# Patient Record
Sex: Male | Born: 1945
Health system: Southern US, Community
[De-identification: ages and names within clinical notes are randomized; demographics above are authoritative.]

## PROBLEM LIST (undated history)

## (undated) DIAGNOSIS — E291 Testicular hypofunction: Secondary | ICD-10-CM

## (undated) DIAGNOSIS — J189 Pneumonia, unspecified organism: Secondary | ICD-10-CM

## (undated) DIAGNOSIS — R51 Headache: Secondary | ICD-10-CM

## (undated) DIAGNOSIS — N200 Calculus of kidney: Secondary | ICD-10-CM

## (undated) DIAGNOSIS — G709 Myoneural disorder, unspecified: Secondary | ICD-10-CM

## (undated) DIAGNOSIS — I10 Essential (primary) hypertension: Secondary | ICD-10-CM

## (undated) DIAGNOSIS — I639 Cerebral infarction, unspecified: Secondary | ICD-10-CM

## (undated) DIAGNOSIS — F419 Anxiety disorder, unspecified: Secondary | ICD-10-CM

## (undated) DIAGNOSIS — F32A Depression, unspecified: Secondary | ICD-10-CM

## (undated) DIAGNOSIS — Z87442 Personal history of urinary calculi: Secondary | ICD-10-CM

## (undated) DIAGNOSIS — N400 Enlarged prostate without lower urinary tract symptoms: Secondary | ICD-10-CM

## (undated) DIAGNOSIS — N529 Male erectile dysfunction, unspecified: Secondary | ICD-10-CM

## (undated) HISTORY — DX: Essential (primary) hypertension: I10

## (undated) HISTORY — PX: FOOT SURGERY: SHX648

## (undated) HISTORY — DX: Benign prostatic hyperplasia without lower urinary tract symptoms: N40.0

## (undated) HISTORY — DX: Headache: R51

## (undated) HISTORY — PX: TONSILLECTOMY: SUR1361

## (undated) HISTORY — DX: Calculus of kidney: N20.0

## (undated) HISTORY — DX: Male erectile dysfunction, unspecified: N52.9

## (undated) HISTORY — DX: Testicular hypofunction: E29.1

---

## 1994-05-17 ENCOUNTER — Encounter: Payer: Self-pay | Admitting: Gastroenterology

## 2004-12-14 ENCOUNTER — Ambulatory Visit: Payer: Self-pay | Admitting: Family Medicine

## 2005-01-18 ENCOUNTER — Ambulatory Visit: Payer: Self-pay | Admitting: Gastroenterology

## 2005-07-18 ENCOUNTER — Ambulatory Visit: Payer: Self-pay | Admitting: Family Medicine

## 2005-08-07 ENCOUNTER — Ambulatory Visit: Payer: Self-pay | Admitting: Family Medicine

## 2006-09-25 ENCOUNTER — Ambulatory Visit: Payer: Self-pay | Admitting: Family Medicine

## 2006-10-04 ENCOUNTER — Ambulatory Visit: Payer: Self-pay | Admitting: Family Medicine

## 2006-10-04 LAB — CONVERTED CEMR LAB
ALT: 71 units/L — ABNORMAL HIGH (ref 0–40)
AST: 30 units/L (ref 0–37)
Basophils Relative: 0.3 % (ref 0.0–1.0)
Bilirubin, Direct: 0.1 mg/dL (ref 0.0–0.3)
Calcium: 9.3 mg/dL (ref 8.4–10.5)
Chloride: 106 meq/L (ref 96–112)
Creatinine, Ser: 1.1 mg/dL (ref 0.4–1.5)
Eosinophils Absolute: 0.1 10*3/uL (ref 0.0–0.6)
Eosinophils Relative: 1.3 % (ref 0.0–5.0)
GFR calc non Af Amer: 73 mL/min
Glucose, Bld: 94 mg/dL (ref 70–99)
Platelets: 355 10*3/uL (ref 150–400)
RBC: 4.47 M/uL (ref 4.22–5.81)
RDW: 12 % (ref 11.5–14.6)
Total Bilirubin: 1.1 mg/dL (ref 0.3–1.2)
WBC: 8.1 10*3/uL (ref 4.5–10.5)

## 2007-07-04 ENCOUNTER — Telehealth (INDEPENDENT_AMBULATORY_CARE_PROVIDER_SITE_OTHER): Payer: Self-pay | Admitting: *Deleted

## 2007-07-07 DIAGNOSIS — I10 Essential (primary) hypertension: Secondary | ICD-10-CM | POA: Insufficient documentation

## 2007-07-22 ENCOUNTER — Encounter: Payer: Self-pay | Admitting: Family Medicine

## 2007-07-24 ENCOUNTER — Ambulatory Visit: Payer: Self-pay | Admitting: Family Medicine

## 2007-07-24 LAB — CONVERTED CEMR LAB
Bilirubin Urine: NEGATIVE
Glucose, Urine, Semiquant: NEGATIVE
WBC Urine, dipstick: NEGATIVE
pH: 5.5

## 2007-07-25 LAB — CONVERTED CEMR LAB
AST: 26 units/L (ref 0–37)
Albumin: 4.3 g/dL (ref 3.5–5.2)
Alkaline Phosphatase: 60 units/L (ref 39–117)
BUN: 19 mg/dL (ref 6–23)
Basophils Absolute: 0 10*3/uL (ref 0.0–0.1)
Chloride: 108 meq/L (ref 96–112)
Cholesterol: 212 mg/dL (ref 0–200)
Creatinine, Ser: 1 mg/dL (ref 0.4–1.5)
Direct LDL: 130.6 mg/dL
MCHC: 33.9 g/dL (ref 30.0–36.0)
Monocytes Relative: 9.5 % (ref 3.0–11.0)
RBC: 4.38 M/uL (ref 4.22–5.81)
RDW: 11.8 % (ref 11.5–14.6)
Total Bilirubin: 0.9 mg/dL (ref 0.3–1.2)
Total CHOL/HDL Ratio: 4.1
Triglycerides: 59 mg/dL (ref 0–149)

## 2007-09-15 ENCOUNTER — Ambulatory Visit: Payer: Self-pay | Admitting: Family Medicine

## 2007-09-15 DIAGNOSIS — R51 Headache: Secondary | ICD-10-CM

## 2007-09-15 DIAGNOSIS — R519 Headache, unspecified: Secondary | ICD-10-CM | POA: Insufficient documentation

## 2008-01-01 ENCOUNTER — Emergency Department (HOSPITAL_COMMUNITY): Admission: EM | Admit: 2008-01-01 | Discharge: 2008-01-02 | Payer: Self-pay | Admitting: Emergency Medicine

## 2008-01-22 ENCOUNTER — Ambulatory Visit (HOSPITAL_COMMUNITY): Admission: RE | Admit: 2008-01-22 | Discharge: 2008-01-22 | Payer: Self-pay | Admitting: Urology

## 2008-10-21 ENCOUNTER — Telehealth: Payer: Self-pay | Admitting: Family Medicine

## 2008-10-25 ENCOUNTER — Ambulatory Visit: Payer: Self-pay | Admitting: Family Medicine

## 2008-10-25 DIAGNOSIS — Z87442 Personal history of urinary calculi: Secondary | ICD-10-CM

## 2008-10-25 DIAGNOSIS — N400 Enlarged prostate without lower urinary tract symptoms: Secondary | ICD-10-CM | POA: Insufficient documentation

## 2008-10-25 HISTORY — DX: Personal history of urinary calculi: Z87.442

## 2008-12-08 ENCOUNTER — Ambulatory Visit: Payer: Self-pay | Admitting: Family Medicine

## 2008-12-08 LAB — CONVERTED CEMR LAB
Bilirubin Urine: NEGATIVE
Ketones, urine, test strip: NEGATIVE
Urobilinogen, UA: 0.2
pH: 6.5

## 2008-12-10 LAB — CONVERTED CEMR LAB
Alkaline Phosphatase: 55 units/L (ref 39–117)
BUN: 23 mg/dL (ref 6–23)
Basophils Absolute: 0 10*3/uL (ref 0.0–0.1)
Bilirubin, Direct: 0 mg/dL (ref 0.0–0.3)
CO2: 27 meq/L (ref 19–32)
Calcium: 9.1 mg/dL (ref 8.4–10.5)
Cholesterol: 207 mg/dL — ABNORMAL HIGH (ref 0–200)
Creatinine, Ser: 1 mg/dL (ref 0.4–1.5)
Direct LDL: 139.4 mg/dL
Eosinophils Absolute: 0.2 10*3/uL (ref 0.0–0.7)
Glucose, Bld: 108 mg/dL — ABNORMAL HIGH (ref 70–99)
Lymphocytes Relative: 35.2 % (ref 12.0–46.0)
MCHC: 34.1 g/dL (ref 30.0–36.0)
Monocytes Absolute: 0.5 10*3/uL (ref 0.1–1.0)
Neutrophils Relative %: 50.8 % (ref 43.0–77.0)
PSA: 2.52 ng/mL (ref 0.10–4.00)
Platelets: 210 10*3/uL (ref 150.0–400.0)
RBC: 4.15 M/uL — ABNORMAL LOW (ref 4.22–5.81)
RDW: 11.7 % (ref 11.5–14.6)
Total Bilirubin: 1.2 mg/dL (ref 0.3–1.2)
Total CHOL/HDL Ratio: 5
Triglycerides: 103 mg/dL (ref 0.0–149.0)

## 2008-12-15 ENCOUNTER — Ambulatory Visit: Payer: Self-pay | Admitting: Family Medicine

## 2009-03-28 ENCOUNTER — Encounter: Payer: Self-pay | Admitting: Family Medicine

## 2009-06-24 ENCOUNTER — Telehealth: Payer: Self-pay | Admitting: Family Medicine

## 2009-12-14 ENCOUNTER — Ambulatory Visit: Payer: Self-pay | Admitting: Family Medicine

## 2009-12-14 LAB — CONVERTED CEMR LAB
Bilirubin Urine: NEGATIVE
Ketones, urine, test strip: NEGATIVE
Specific Gravity, Urine: 1.025

## 2009-12-15 LAB — CONVERTED CEMR LAB
ALT: 24 units/L (ref 0–53)
Albumin: 4.4 g/dL (ref 3.5–5.2)
BUN: 24 mg/dL — ABNORMAL HIGH (ref 6–23)
Basophils Relative: 0.5 % (ref 0.0–3.0)
CO2: 30 meq/L (ref 19–32)
Chloride: 105 meq/L (ref 96–112)
Cholesterol: 180 mg/dL (ref 0–200)
Creatinine, Ser: 1 mg/dL (ref 0.4–1.5)
Eosinophils Absolute: 0.2 10*3/uL (ref 0.0–0.7)
Eosinophils Relative: 2.9 % (ref 0.0–5.0)
HCT: 40.6 % (ref 39.0–52.0)
LDL Cholesterol: 123 mg/dL — ABNORMAL HIGH (ref 0–99)
Lymphs Abs: 1.4 10*3/uL (ref 0.7–4.0)
MCHC: 34.6 g/dL (ref 30.0–36.0)
MCV: 99.9 fL (ref 78.0–100.0)
Monocytes Absolute: 0.6 10*3/uL (ref 0.1–1.0)
Neutro Abs: 3 10*3/uL (ref 1.4–7.7)
Neutrophils Relative %: 58.1 % (ref 43.0–77.0)
PSA: 2.37 ng/mL (ref 0.10–4.00)
Potassium: 5.1 meq/L (ref 3.5–5.1)
RBC: 4.06 M/uL — ABNORMAL LOW (ref 4.22–5.81)
TSH: 2.49 microintl units/mL (ref 0.35–5.50)
Total CHOL/HDL Ratio: 4
Total Protein: 6.9 g/dL (ref 6.0–8.3)
Triglycerides: 71 mg/dL (ref 0.0–149.0)
WBC: 5.2 10*3/uL (ref 4.5–10.5)

## 2009-12-27 ENCOUNTER — Ambulatory Visit: Payer: Self-pay | Admitting: Family Medicine

## 2010-01-04 ENCOUNTER — Encounter (INDEPENDENT_AMBULATORY_CARE_PROVIDER_SITE_OTHER): Payer: Self-pay | Admitting: *Deleted

## 2010-02-22 ENCOUNTER — Ambulatory Visit: Payer: Self-pay | Admitting: Gastroenterology

## 2010-03-28 ENCOUNTER — Ambulatory Visit: Payer: Self-pay | Admitting: Gastroenterology

## 2010-03-28 HISTORY — PX: OTHER SURGICAL HISTORY: SHX169

## 2010-03-29 DIAGNOSIS — K648 Other hemorrhoids: Secondary | ICD-10-CM | POA: Insufficient documentation

## 2010-04-02 ENCOUNTER — Encounter: Payer: Self-pay | Admitting: Gastroenterology

## 2010-04-14 ENCOUNTER — Encounter: Payer: Self-pay | Admitting: Gastroenterology

## 2010-04-14 HISTORY — PX: STAPLE HEMORRHOIDECTOMY: SHX2438

## 2010-04-25 ENCOUNTER — Telehealth: Payer: Self-pay | Admitting: Family Medicine

## 2010-06-02 ENCOUNTER — Ambulatory Visit (HOSPITAL_COMMUNITY)
Admission: RE | Admit: 2010-06-02 | Discharge: 2010-06-02 | Payer: Self-pay | Source: Home / Self Care | Attending: Surgery | Admitting: Surgery

## 2010-06-22 ENCOUNTER — Encounter: Payer: Self-pay | Admitting: Family Medicine

## 2010-06-29 ENCOUNTER — Telehealth: Payer: Self-pay | Admitting: Family Medicine

## 2010-07-16 LAB — CONVERTED CEMR LAB: Testosterone: 349.06 ng/dL — ABNORMAL LOW (ref 350.00–890.00)

## 2010-07-19 NOTE — Progress Notes (Signed)
Summary: med questions  Phone Note Call from Patient Call back at [facility] Phone [phone]   Summary of Call: Wants to go on a daily dosage of Cialis, and needs to pick up the prescription.  Initial call taken by: Lynann Beaver CMA AAMA,  April 25, 2010 4:05 PM  Follow-up for Phone Call        call in 5 mg once daily , #30 with 11 rf Follow-up by: [name] MD,  April 25, 2010 4:37 PM  Additional Follow-up for Phone Call Additional follow up Details #1::        wants 90 day supply for [facility] and pt will pick up  Additional Follow-up by: Pura Spice, RN,  April 26, 2010 8:18 AM    Additional Follow-up for Phone Call Additional follow up Details #2::    done  Follow-up by: [name] MD,  April 26, 2010 8:50 AM  New/Updated Medications: CIALIS 5 MG TABS (TADALAFIL) once daily Prescriptions: CIALIS 5 MG TABS (TADALAFIL) once daily  #90 x 3   Entered and Authorized by:   [name] MD   Signed by:   [name] MD on 04/26/2010   Method used:   Print then Give to Patient   RxID:   [id number]

## 2010-07-19 NOTE — Progress Notes (Signed)
Summary: refill androgel pump  Phone Note Refill Request   Refills Requested: Medication #1:  ANDROGEL PUMP 1 % GEL apply 10 grams daily Initial call taken by: Alfred Levins, CMA,  June 24, 2009 3:57 PM  Follow-up for Phone Call        call these in for one year Follow-up by: [name] MD,  June 24, 2009 5:10 PM  Additional Follow-up for Phone Call Additional follow up Details #1::        Rx called to pharmacy Additional Follow-up by: Alfred Levins, CMA,  June 24, 2009 5:17 PM    Prescriptions: ANDROGEL PUMP 1 % GEL (TESTOSTERONE) apply 10 grams daily  #1 x 11   Entered by:   Alfred Levins, CMA   Authorized by:   [name] MD   Signed by:   Alfred Levins, CMA on 06/24/2009   Method used:   Telephoned to ...       [facility] [address] [phone]* (retail)       [address]       [place], [place]  [address]       Ph: [phone] or [phone]       Fax: [phone]   RxID:   [id number]

## 2010-07-19 NOTE — Procedures (Signed)
Summary: Colonoscopy  Patient: Calvin [name] Note: All result statuses are Final unless otherwise noted.  Tests: (1) Colonoscopy (COL)   COL Colonoscopy           DONE     [facility]     [address]     [place], [place]  [address]           COLONOSCOPY PROCEDURE REPORT     PATIENT:  Calvin, [name]  MR#:  [id number]     BIRTHDATE:  [birth year], 64 yrs. old  GENDER:  male     ENDOSCOPIST:  Judie Petit T. Russella Dar, MD, [facility]     Referred by:  Tera Mater. Clent Ridges, M.D.     PROCEDURE DATE:  03/28/2010     PROCEDURE:  Colonoscopy with snare polypectomy     ASA CLASS:  Class II     INDICATIONS:  1) Routine Risk Screening     MEDICATIONS:   Fentanyl 50 mcg IV, Versed 7 mg IV     DESCRIPTION OF PROCEDURE:   After the risks benefits and     alternatives of the procedure were thoroughly explained, informed     consent was obtained.  Digital rectal exam was performed and     revealed prolasped hemorrhoids, possible condyloma. The LB     [id number] endoscope was introduced through the anus and     advanced to the cecum, which was identified by both the appendix     and ileocecal valve, without limitations.  The quality of the prep     was excellent, using MoviPrep.  The instrument was then slowly     withdrawn as the colon was fully examined.     <<PROCEDUREIMAGES>>     FINDINGS:  A sessile polyp was found in the mid transverse colon.     It was 5 mm in size. Polyp was snared without cautery. Retrieval     was successful. snare polyp  Mild diverticulosis was found in the     sigmoid colon.  A normal appearing cecum, ileocecal valve, and     appendiceal orifice were identified. The ascending, hepatic     flexure, splenic flexure, descending colon, and rectum appeared     unremarkable. Retroflexed views in the rectum revealed internal     hemorrhoids, moderate size, prolapsing. R/O condyloma. The time to     cecum =  5.5  minutes. The scope was then withdrawn (time =  12.25     min) from  the patient and the procedure completed.           COMPLICATIONS:  None           ENDOSCOPIC IMPRESSION:     1) 5 mm sessile polyp in the mid transverse colon     2) Mild diverticulosis in the sigmoid colon     3) Internal hemorrhoids, prolasping, R/O condyloma           RECOMMENDATIONS:     1) Await pathology results     2) High fiber diet with liberal fluid intake.     3) If the polyp removed is adenomatous (pre-cancerous), you will     need a repeat colonoscopy in 5 years. Otherwise follow colorectal     cancer screening for "routine risk" patients with colonoscopy in     10 years.     4) My office will arrange for you to meet with a surgeon.  Venita Lick. Russella Dar, MD, [facility]           n.     eSIGNED:   Venita Lick. Stark at 03/28/2010 04:40 PM           Verdis Prime, [id number]  Note: An exclamation mark (!) indicates a result that was not dispersed into the flowsheet. Document Creation Date: 03/28/2010 4:41 PM _______________________________________________________________________  (1) Order result status: Final Collection or observation date-time: 03/28/2010 16:35 Requested date-time:  Receipt date-time:  Reported date-time:  Referring Physician:   Ordering Physician: Claudette Head [phone]) Specimen Source:  Source: Launa Grill Order Number: [id number] Lab site:   Appended Document: Orders Update    Clinical Lists Changes  Problems: Added new problem of INTERNAL HEMORRHOIDS WITH OTHER COMPLICATION (ICD-455.2) Orders: Added new Test order of [facility] ([facility]) - Signed      Appended Document: Colonoscopy Pt notified of appt at [facility] with Dr. Michaell Cowing on 04/14/10 at 9:00am. Pt verbalized understanding.  Appended Document: Colonoscopy     Procedures Next Due Date:    Colonoscopy: 03/2020

## 2010-07-19 NOTE — Assessment & Plan Note (Signed)
Summary: cpx/cjr/pt rsc from bmp/cjr   Vital Signs:  Patient profile:   65 year old male Weight:      208 pounds BMI:     28.91 BP sitting:   138 / 100  (left arm) Cuff size:   regular  Vitals Entered By: Raechel Ache, RN (December 27, 2009 10:18 AM) CC: CPX, labs done. C/o L elbow pain and rash on face.   History of Present Illness: 65 yr old male for a cpx. He feels well in general although he has had pain in the left elbow for about 2 months. 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 He has been on Androgel for a year for a low testosterone level, but he is not sure it is doing him any good. Also he still struggles with some BPH issues and some nocturia.   Allergies: 1)  ! Sulfa  Past History:  Past Medical History: Headache Hypertension ED Nephrolithiasis, hx of. Had lithotripsy in 1-10 Benign prostatic hypertrophy hypogonadism  Past Surgical History: Reviewed history from 09/15/2007 and no changes required. Tonsillectomy colonoscopy 8-06 per Dr. Corinda Gubler, repeat in 5 yrs  Family History: Reviewed history from 09/15/2007 and no changes required. Family History Hypertension Family History of Stroke M 1st degree relative <50 Rheumatic heart disease Family History of CAD Male 1st degree relative <50 Family History of CAD Male 1st degree relative <60  Social History: Reviewed history from 09/15/2007 and no changes required. Never Smoked Alcohol use-yes Widow/Widower  Review of Systems  The patient denies anorexia, fever, weight loss, weight gain, vision loss, decreased hearing, hoarseness, chest pain, syncope, dyspnea on exertion, peripheral edema, prolonged cough, headaches, hemoptysis, abdominal pain, melena, hematochezia, severe indigestion/heartburn, hematuria, incontinence, genital sores, muscle weakness, suspicious skin lesions, transient blindness, difficulty walking, depression,  unusual weight change, abnormal bleeding, enlarged lymph nodes, angioedema, breast masses, and testicular masses.    Physical Exam  General:  Well-developed,well-nourished,in no acute distress; alert,appropriate and cooperative throughout examination Head:  Normocephalic and atraumatic without obvious abnormalities. No apparent alopecia or balding. Eyes:  No corneal or conjunctival inflammation noted. EOMI. Perrla. Funduscopic exam benign, without hemorrhages, exudates or papilledema. Vision grossly normal. Ears:  External ear exam shows no significant lesions or deformities.  Otoscopic examination reveals clear canals, tympanic membranes are intact bilaterally without bulging, retraction, inflammation or discharge. Hearing is grossly normal bilaterally. Nose:  External nasal examination shows no deformity or inflammation. Nasal mucosa are pink and moist without lesions or exudates. Mouth:  Oral mucosa and oropharynx without lesions or exudates.  Teeth in good repair. Neck:  No deformities, masses, or tenderness noted. Chest Wall:  No deformities, masses, tenderness or gynecomastia noted. Lungs:  Normal respiratory effort, chest expands symmetrically. Lungs are clear to auscultation, no crackles or wheezes. Heart:  Normal rate and regular rhythm. S1 and S2 normal without gallop, murmur, click, rub or other extra sounds. EKG normal Abdomen:  Bowel sounds positive,abdomen soft and non-tender without masses, organomegaly or hernias noted. Rectal:  No external abnormalities noted. Normal sphincter tone. No rectal masses or tenderness. Heme neg. Genitalia:  Testes bilaterally descended without nodularity, tenderness or masses. No scrotal masses or lesions. No penis lesions or urethral discharge. Prostate:  no nodules, no asymmetry, and 2+ enlarged.   Msk:  No deformity or scoliosis noted of thoracic or lumbar spine.   Pulses:  R and L carotid,radial,femoral,dorsalis pedis and posterior tibial pulses  are  full and equal bilaterally Extremities:  No clubbing, cyanosis, edema, or deformity noted with normal full range of motion of all joints.   Neurologic:  No cranial nerve deficits noted. Station and gait are normal. Plantar reflexes are down-going bilaterally. DTRs are symmetrical throughout. Sensory, motor and coordinative functions appear intact. Skin:  Intact without suspicious lesions or rashes Cervical Nodes:  No lymphadenopathy noted Axillary Nodes:  No palpable lymphadenopathy Inguinal Nodes:  No significant adenopathy Psych:  Cognition and judgment appear intact. Alert and cooperative with normal attention span and concentration. No apparent delusions, illusions, hallucinations   Impression & Recommendations:  Problem # 1:  WELL ADULT EXAM (ICD-V70.0)  Orders: Hemoccult Guaiac-1 spec.(in office) (82270) EKG w/ Interpretation (93000)  Complete Medication List: 1)  Diovan Hct 320-25 Mg Tabs (Valsartan-hydrochlorothiazide) .... Once daily 2)  Flomax 0.4 Mg Cp24 (Tamsulosin hcl) .... Once daily 3)  Bayer Low Strength 81 Mg Tbec (Aspirin) .... Once daily 4)  Avodart 0.5 Mg Caps (Dutasteride) .... Once daily 5)  Viagra 100 Mg Tabs (Sildenafil citrate) .... As needed 6)  Amlodipine Besylate 5 Mg Tabs (Amlodipine besylate) .... Once daily 7)  Etodolac 500 Mg Tabs (Etodolac) .... Two times a day as needed pain  Patient Instructions: 1)  Stop Androgel, and continue with Flomax and Avodart. He has some tendonitis in the elbow, so I advised him to rest this as much as possible for a few weeks. Use ice packs and Etodolac. Add Amlodipine for the HTN.  2)  Schedule a colonoscopy/ sigmoidoscopy to help detect colon cancer.  3)  Please schedule a follow-up appointment in 1 month.  Prescriptions: ETODOLAC 500 MG TABS (ETODOLAC) two times a day as needed pain  #60 x 2   Entered and Authorized by:   [name] MD   Signed by:   [name] MD on 12/27/2009   Method used:    Electronically to        [facility] [address] [phone]* (retail)       [address]       [place], [place]  [address]       Ph: [phone] or [phone]       Fax: [phone]   RxID:   [id number] AVODART 0.5 MG CAPS (DUTASTERIDE) once daily  #30 x 11   Entered and Authorized by:   [name] MD   Signed by:   [name] MD on 12/27/2009   Method used:   Electronically to        [facility]. [address] [phone]* (retail)       [address]       [place], [place]  [address]       Ph: [phone] or [phone]       Fax: [phone]   RxID:   [id number] FLOMAX 0.4 MG  CP24 (TAMSULOSIN HCL) once daily  #30 x 11   Entered and Authorized by:   [name] MD   Signed by:   [name] MD on 12/27/2009   Method used:   Electronically to        [facility]. [address] [phone]* (retail)       [address]       [place], [place]  [address]       Ph: [phone] or  [phone]       Fax: [phone]   RxID:   [id number] DIOVAN HCT 320-25 MG TABS (VALSARTAN-HYDROCHLOROTHIAZIDE) once daily  #30 x 11   Entered and Authorized by:   [name] MD   Signed by:   [name] MD on 12/27/2009   Method used:   Electronically to        [facility] [address] [phone]* (retail)       [address]       [place], [place]  [address]       Ph: [phone] or [phone]       Fax: [phone]   RxID:   [id number] AMLODIPINE BESYLATE 5 MG TABS (AMLODIPINE BESYLATE) once daily  #30 x 11   Entered and Authorized by:   [name] MD   Signed by:   [name] MD on 12/27/2009   Method used:   Electronically to        [facility] [address] [phone]* (retail)       [address]       [place], [place]  [address]       Ph: [phone] or [phone]       Fax: [phone]   RxID:   [id number]

## 2010-07-19 NOTE — Letter (Signed)
Summary: Patient Notice-Hyperplastic Polyps  [facility]  [address] [place], [place] [address]   Phone: [phone]  Fax: [phone]        April 02, 2010 [name]: [id number]    Calvin [name] [address] [place], [place]  [address]    Dear Mr. Barse,  I am pleased to inform you that the colon polyp(s) removed during your recent colonoscopy was (were) found to be inflammatory. These types of polyps are NOT pre-cancerous.  It is my recommendation that you have a repeat colonoscopy examination in 10 years for routine colorectal cancer screening.  Should you develop new or worsening symptoms of abdominal pain, bowel habit changes or bleeding from the rectum or bowels, please schedule an evaluation with either your primary care physician or with me.  Continue treatment plan as outlined the day of your exam.  Please call us if you are having persistent problems or have questions about your condition that have not been fully answered at this time.  Sincerely,  Meryl Dare MD [facility]  This letter has been electronically signed by your physician.  Appended Document: Patient Notice-Hyperplastic Polyps letter mailed

## 2010-07-19 NOTE — Assessment & Plan Note (Signed)
Summary: Discuss Colon/Flex--ch.   History of Present Illness Visit Type: consult Primary GI MD: Elie Goody MD [facility] Primary Provider: Gershon Crane, MD Requesting Provider: Gershon Crane, MD Chief Complaint: pt here to discuss screening colon History of Present Illness:   This is a 65 year old male, who is here to discuss colorectal cancer screening. He has no ongoing gastrointestinal complaints. He previously underwent colonoscopy in 1995 that was unremarkable. He has stable hypertension, BPH, and hypogonadism.   GI Review of Systems      Denies abdominal pain, acid reflux, belching, bloating, chest pain, dysphagia with liquids, dysphagia with solids, heartburn, loss of appetite, nausea, vomiting, vomiting blood, weight loss, and  weight gain.        Denies anal fissure, black tarry stools, change in bowel habit, constipation, diarrhea, diverticulosis, fecal incontinence, heme positive stool, hemorrhoids, irritable bowel syndrome, jaundice, light color stool, liver problems, rectal bleeding, and  rectal pain.   Current Medications (verified): 1)  Diovan Hct 320-25 Mg Tabs (Valsartan-Hydrochlorothiazide) .... Once Daily 2)  Flomax 0.4 Mg  Cp24 (Tamsulosin Hcl) .... Once Daily 3)  Bayer Low Strength 81 Mg Tbec (Aspirin) .... Once Daily 4)  Avodart 0.5 Mg Caps (Dutasteride) .... Once Daily 5)  Viagra 100 Mg Tabs (Sildenafil Citrate) .... As Needed 6)  Amlodipine Besylate 5 Mg Tabs (Amlodipine Besylate) .... Once Daily 7)  Etodolac 500 Mg Tabs (Etodolac) .... Two Times A Day As Needed Pain  Allergies (verified): 1)  ! Sulfa  Past History:  Past Medical History: Reviewed history from 12/27/2009 and no changes required. Headache Hypertension ED Nephrolithiasis, hx of. Had lithotripsy in 1-10 Benign prostatic hypertrophy hypogonadism  Past Surgical History: Reviewed history from 02/21/2010 and no changes required. Tonsillectomy  Family History: Reviewed history from  12/27/2009 and no changes required. Family History Hypertension Family History of Stroke M 1st degree relative <50 Rheumatic heart disease Family History of CAD Male 1st degree relative <50 Family History of CAD Male 1st degree relative <60  Social History: Reviewed history from 12/27/2009 and no changes required. Never Smoked Alcohol use-yes Widow/Widower Daily Caffeine Use Illicit Drug Use - no Drug Use:  no  Review of Systems       The patient complains of skin rash and urination - excessive.         The pertinent positives and negatives are noted as above and in the HPI. All other ROS were reviewed and were negative.   Vital Signs:  Patient profile:   65 year old male Height:      71.25214 inches Weight:      6 pounds BMI:     0.83 Pulse rate:   80 / minute Pulse rhythm:   regular BP sitting:   118 / 78  (left arm)  Vitals Entered By: Milford Cage NCMA (February 22, 2010 2:45 PM)  Physical Exam  General:  Well developed, well nourished, no acute distress. Head:  Normocephalic and atraumatic. Eyes:  PERRLA, no icterus. Ears:  Normal auditory acuity. Mouth:  No deformity or lesions, dentition normal. Neck:  Supple; no masses or thyromegaly. Lungs:  Clear throughout to auscultation. Heart:  Regular rate and rhythm; no murmurs, rubs,  or bruits. Abdomen:  Soft, nontender and nondistended. No masses, hepatosplenomegaly or hernias noted. Normal bowel sounds. Rectal:  deferred until time of colonoscopy.   Msk:  Symmetrical with no gross deformities. Normal posture. Pulses:  Normal pulses noted. Extremities:  No clubbing, cyanosis, edema or deformities noted. Neurologic:  Alert  and  oriented x4;  grossly normal neurologically. Cervical Nodes:  No significant cervical adenopathy. Inguinal Nodes:  No significant inguinal adenopathy. Psych:  Alert and cooperative. Normal mood and affect.   Impression & Recommendations:  Problem # 1:  SPECIAL SCREENING FOR  MALIGNANT NEOPLASMS COLON (ICD-V76.51) Average risk for colorectal cancer. It has been almost 16 years since his last colonoscopy. The risks, benefits and alternatives to colonoscopy with possible biopsy and possible polypectomy were discussed with the patient and they consent to proceed. The procedure will be scheduled electively. Orders: Colonoscopy (Colon)  Patient Instructions: 1)  Colonoscopy brochure given.  2)  Pick up your prep from your pharmacy.  3)  Copy sent to : Gershon Crane, MD 4)  The medication list was reviewed and reconciled.  All changed / newly prescribed medications were explained.  A complete medication list was provided to the patient / caregiver.  Prescriptions: MOVIPREP 100 GM  SOLR (PEG-KCL-NACL-NASULF-NA ASC-C) As per prep instructions.  #1 x 0   Entered by:   Christie Nottingham CMA (AAMA)   Authorized by:   Meryl Dare MD [facility]   Signed by:   Meryl Dare MD [facility] on 02/22/2010   Method used:   Electronically to        [facility]. [address] [phone]* (retail)       [address]       [place], [place]  [address]       Ph: [phone] or [phone]       Fax: [phone]   RxID:   [id number]

## 2010-07-19 NOTE — Procedures (Signed)
Summary: [employer]   Imported By: Sherian Rein 02/23/2010 11:52:23  _____________________________________________________________________  External Attachment:    Type:   Image     Comment:   External Document

## 2010-07-19 NOTE — Letter (Signed)
Summary: [facility] Instructions  [facility]  [address] [place], [place] [address]   Phone: [phone]  Fax: [phone]       Calvin [name]    06-02-1946    [name]: [id number]        Procedure Day /Date: Tuesday October 11th, 2011     Arrival Time: 3:00pm     Procedure Time: 4:00pm     Location of Procedure:                    _ x_  [facility] (4th Floor)                        PREPARATION FOR COLONOSCOPY WITH MOVIPREP   Starting 5 days prior to your procedure 03/23/10 do not eat nuts, seeds, popcorn, corn, beans, peas,  salads, or any raw vegetables.  Do not take any fiber supplements (e.g. Metamucil, Citrucel, and Benefiber).  THE DAY BEFORE YOUR PROCEDURE         DATE:03/27/10  ONG:EXBMWU  1.  Drink clear liquids the entire day-NO SOLID FOOD  2.  Do not drink anything colored red or purple.  Avoid juices with pulp.  No orange juice.  3.  Drink at least 64 oz. (8 glasses) of fluid/clear liquids during the day to prevent dehydration and help the prep work efficiently.  CLEAR LIQUIDS INCLUDE: Water Jello Ice Popsicles Tea (sugar ok, no milk/cream) Powdered fruit flavored drinks Coffee (sugar ok, no milk/cream) Gatorade Juice: apple, white grape, white cranberry  Lemonade Clear bullion, consomm, broth Carbonated beverages (any kind) Strained chicken noodle soup Hard Candy                             4.  In the morning, mix first dose of MoviPrep solution:    Empty 1 Pouch A and 1 Pouch B into the disposable container    Add lukewarm drinking water to the top line of the container. Mix to dissolve    Refrigerate (mixed solution should be used within 24 hrs)  5.  Begin drinking the prep at 5:00 p.m. The MoviPrep container is divided by 4 marks.   Every 15 minutes drink the solution down to the next mark (approximately 8 oz) until the full liter is complete.   6.  Follow completed prep with 16 oz of clear liquid of your choice  (Nothing red or purple).  Continue to drink clear liquids until bedtime.  7.  Before going to bed, mix second dose of MoviPrep solution:    Empty 1 Pouch A and 1 Pouch B into the disposable container    Add lukewarm drinking water to the top line of the container. Mix to dissolve    Refrigerate  THE DAY OF YOUR PROCEDURE      DATE: 03/28/10 XLK:GMWNUUV  Beginning at 11:00 a.m. (5 hours before procedure):         1. Every 15 minutes, drink the solution down to the next mark (approx 8 oz) until the full liter is complete.  2. Follow completed prep with 16 oz. of clear liquid of your choice.    3. You may drink clear liquids until 2:00pm (2 HOURS BEFORE PROCEDURE).   MEDICATION INSTRUCTIONS  Unless otherwise instructed, you should take regular prescription medications with a small sip of water   as early as possible the morning of your procedure.  OTHER INSTRUCTIONS  You will need a responsible adult at least 63 years of age to accompany you and drive you home.   This person must remain in the waiting room during your procedure.  Wear loose fitting clothing that is easily removed.  Leave jewelry and other valuables at home.  However, you may wish to bring a book to read or  an iPod/MP3 player to listen to music as you wait for your procedure to start.  Remove all body piercing jewelry and leave at home.  Total time from sign-in until discharge is approximately 2-3 hours.  You should go home directly after your procedure and rest.  You can resume normal activities the  day after your procedure.  The day of your procedure you should not:   Drive   Make legal decisions   Operate machinery   Drink alcohol   Return to work  You will receive specific instructions about eating, activities and medications before you leave.    The above instructions have been reviewed and explained to me by   [name].     I fully understand and can verbalize these  instructions _____________________________ Date _________

## 2010-07-19 NOTE — Letter (Signed)
Summary: [facility] Surgery   Imported By: Sherian Rein 04/21/2010 11:59:13  _____________________________________________________________________  External Attachment:    Type:   Image     Comment:   External Document

## 2010-07-19 NOTE — Letter (Signed)
Summary: New Patient letter  [facility]  [address] [place], [place] [address]   Phone: [phone]  Fax: [phone]       01/04/2010 [name]: [id number]  Calvin [name] [address] [place], [place]  [address]  Dear [name],  Welcome to the Gastroenterology Division at [facility].    You are scheduled to see Dr.  Russella Dar on 02-22-10 at 2:30p.m. on the 3rd floor at [facility], [address]  We ask that you try to arrive at our office 15 minutes prior to your appointment time to allow for check-in.  We would like you to complete the enclosed self-administered evaluation form prior to your visit and bring it with you on the day of your appointment.  We will review it with you.  Also, please bring a complete list of all your medications or, if you prefer, bring the medication bottles and we will list them.  Please bring your insurance card so that we may make a copy of it.  If your insurance requires a referral to see a specialist, please bring your referral form from your primary care physician.  Co-payments are due at the time of your visit and may be paid by cash, check or credit card.     Your office visit will consist of a consult with your physician (includes a physical exam), any laboratory testing he/she may order, scheduling of any necessary diagnostic testing (e.g. x-ray, ultrasound, CT-scan), and scheduling of a procedure (e.g. Endoscopy, Colonoscopy) if required.  Please allow enough time on your schedule to allow for any/all of these possibilities.    If you cannot keep your appointment, please call [phone] to cancel or reschedule prior to your appointment date.  This allows [place] the opportunity to schedule an appointment for another patient in need of care.  If you do not cancel or reschedule by 5 p.m. the business day prior to your appointment date, you will be charged a $50.00 late cancellation/no-show fee.    Thank you for choosing  [facility] for your medical needs.  We appreciate the opportunity to care for you.  Please visit [place] at our website  to learn more about our practice.                     Sincerely,                                                             The Gastroenterology Division

## 2010-07-20 NOTE — Letter (Signed)
Summary: [facility]   Imported By: Maryln Gottron 07/06/2010 10:03:03  _____________________________________________________________________  External Attachment:    Type:   Image     Comment:   External Document

## 2010-07-20 NOTE — Progress Notes (Signed)
Summary: wants to pick up written rxs  Phone Note Call from Patient Call back at Home Phone [phone]   Caller: Patient--live call Reason for Call: Insurance Question, Referral Summary of Call: pt would like all of his meds written out for mail order. please call when ready for pick up. Initial call taken by: Warnell Forester,  June 29, 2010 3:47 PM  Follow-up for Phone Call        wants all  prescriptions written again so he can call around to see if can get cheaper. All rx's was written in july and are having filled at [facility]  so he's not sure what he is going to do with rx if will do locally or maiil order.  Follow-up by: Pura Spice, RN,  June 30, 2010 4:33 PM  Additional Follow-up for Phone Call Additional follow up Details #1::        he does not need actual prescriptions to call around and get prices. If he wants to switch pharmacies, pick one out and then let us know  Additional Follow-up by: [name] MD,  June 30, 2010 5:05 PM    Additional Follow-up for Phone Call Additional follow up Details #2::    Notified pt. Follow-up by: Lynann Beaver CMA AAMA,  July 03, 2010 10:17 AM

## 2010-08-29 ENCOUNTER — Telehealth: Payer: Self-pay | Admitting: Family Medicine

## 2010-08-29 LAB — BASIC METABOLIC PANEL
BUN: 16 mg/dL (ref 6–23)
CO2: 26 mEq/L (ref 19–32)
Calcium: 9.4 mg/dL (ref 8.4–10.5)
Creatinine, Ser: 0.92 mg/dL (ref 0.4–1.5)
GFR calc non Af Amer: >60 mL/min (ref >60)
Glucose, Bld: 91 mg/dL (ref 70–99)
Sodium: 138 mEq/L (ref 135–145)

## 2010-08-29 LAB — SURGICAL PCR SCREEN: Staphylococcus aureus: NEGATIVE

## 2010-08-29 NOTE — Telephone Encounter (Signed)
Pt is out of town a would like another rx for Viagra fax to [facility] at [phone]. Any ? Call pt.

## 2010-08-30 ENCOUNTER — Other Ambulatory Visit: Payer: Self-pay

## 2010-08-30 MED ORDER — SILDENAFIL CITRATE 100 MG PO TABS: 100.0000 mg | ORAL_TABLET | ORAL | Status: DC | PRN

## 2010-08-30 NOTE — Telephone Encounter (Signed)
Rx faxed to [facility] at [phone]. Pt aware

## 2010-08-30 NOTE — Telephone Encounter (Signed)
done

## 2010-08-30 NOTE — Telephone Encounter (Signed)
Okay for #10 with 11 rf

## 2010-10-10 ENCOUNTER — Other Ambulatory Visit: Payer: Self-pay | Admitting: Family Medicine

## 2011-01-29 ENCOUNTER — Other Ambulatory Visit: Payer: Self-pay | Admitting: Family Medicine

## 2011-01-29 NOTE — Telephone Encounter (Deleted)
Refill request for Amlodipine Besylate 5 mg take 1 po qd. I do not see where the pt has been seen recently, the script was last filled on 10/17/10.

## 2011-01-29 NOTE — Telephone Encounter (Deleted)
Refill request for Diovan Hct 320 mg/25mg  take 1 po qd. I did not see where the pt has been seen recently, script last filled on 10/10/10.

## 2011-01-29 NOTE — Telephone Encounter (Signed)
Call in #30 of all three with no rf. He needs an OV soon

## 2011-01-31 ENCOUNTER — Telehealth: Payer: Self-pay | Admitting: Family Medicine

## 2011-01-31 MED ORDER — AMLODIPINE BESYLATE 5 MG PO TABS: 5.0000 mg | ORAL_TABLET | Freq: Every day | ORAL | Status: DC

## 2011-01-31 MED ORDER — TAMSULOSIN HCL 0.4 MG PO CAPS: 0.4000 mg | ORAL_CAPSULE | Freq: Every day | ORAL | Status: DC

## 2011-01-31 MED ORDER — VALSARTAN-HYDROCHLOROTHIAZIDE 320-25 MG PO TABS: 1.0000 | ORAL_TABLET | Freq: Every day | ORAL | Status: DC

## 2011-01-31 NOTE — Telephone Encounter (Signed)
I called in scripts and left message for pt. Advised to make a appointment.

## 2011-02-01 NOTE — Telephone Encounter (Signed)
Scripts sent and pt aware, needs office visit.

## 2011-02-03 ENCOUNTER — Other Ambulatory Visit: Payer: Self-pay | Admitting: Family Medicine

## 2011-02-16 ENCOUNTER — Other Ambulatory Visit (INDEPENDENT_AMBULATORY_CARE_PROVIDER_SITE_OTHER): Payer: BC Managed Care – PPO

## 2011-02-16 DIAGNOSIS — Z0000 Encounter for general adult medical examination without abnormal findings: Secondary | ICD-10-CM

## 2011-02-16 LAB — CBC WITH DIFFERENTIAL/PLATELET
Eosinophils Absolute: 0.2 10*3/uL (ref 0.0–0.7)
Eosinophils Relative: 3.4 % (ref 0.0–5.0)
Lymphocytes Relative: 26.3 % (ref 12.0–46.0)
MCV: 100.6 fl — ABNORMAL HIGH (ref 78.0–100.0)
Monocytes Absolute: 0.7 10*3/uL (ref 0.1–1.0)
Neutrophils Relative %: 56.3 % (ref 43.0–77.0)
Platelets: 244 10*3/uL (ref 150.0–400.0)
RBC: 4.2 Mil/uL — ABNORMAL LOW (ref 4.22–5.81)
WBC: 5.1 10*3/uL (ref 4.5–10.5)

## 2011-02-16 LAB — POCT URINALYSIS DIPSTICK
Blood, UA: NEGATIVE
Protein, UA: NEGATIVE
Spec Grav, UA: 1.02
Urobilinogen, UA: 0.2

## 2011-02-16 LAB — HEPATIC FUNCTION PANEL
ALT: 35 U/L (ref 0–53)
AST: 29 U/L (ref 0–37)
Albumin: 4.7 g/dL (ref 3.5–5.2)

## 2011-02-16 LAB — TSH: TSH: 2.25 u[IU]/mL (ref 0.35–5.50)

## 2011-02-16 LAB — BASIC METABOLIC PANEL
BUN: 22 mg/dL (ref 6–23)
Calcium: 9.6 mg/dL (ref 8.4–10.5)
Chloride: 104 mEq/L (ref 96–112)
Creatinine, Ser: 1.1 mg/dL (ref 0.4–1.5)

## 2011-02-16 LAB — LIPID PANEL
Cholesterol: 235 mg/dL — ABNORMAL HIGH (ref 0–200)
HDL: 56.9 mg/dL (ref >39.00)
Total CHOL/HDL Ratio: 4
Triglycerides: 106 mg/dL (ref 0.0–149.0)

## 2011-02-16 LAB — PSA: PSA: 1.35 ng/mL (ref 0.10–4.00)

## 2011-02-20 ENCOUNTER — Other Ambulatory Visit: Payer: Self-pay | Admitting: *Deleted

## 2011-02-20 MED ORDER — ATORVASTATIN CALCIUM 20 MG PO TABS: 20.0000 mg | ORAL_TABLET | Freq: Every day | ORAL | Status: DC

## 2011-02-28 ENCOUNTER — Encounter: Payer: Self-pay | Admitting: Family Medicine

## 2011-02-28 ENCOUNTER — Ambulatory Visit (INDEPENDENT_AMBULATORY_CARE_PROVIDER_SITE_OTHER): Payer: BC Managed Care – PPO | Admitting: Family Medicine

## 2011-02-28 VITALS — BP 128/78 | HR 101 | Temp 98.4°F | Ht 71.75 in | Wt 214.0 lb

## 2011-02-28 DIAGNOSIS — Z23 Encounter for immunization: Secondary | ICD-10-CM

## 2011-02-28 DIAGNOSIS — Z0000 Encounter for general adult medical examination without abnormal findings: Secondary | ICD-10-CM

## 2011-02-28 MED ORDER — DUTASTERIDE 0.5 MG PO CAPS: 0.5000 mg | ORAL_CAPSULE | Freq: Every day | ORAL | Status: DC

## 2011-02-28 MED ORDER — AMLODIPINE BESYLATE 5 MG PO TABS: 5.0000 mg | ORAL_TABLET | Freq: Every day | ORAL | Status: DC

## 2011-02-28 MED ORDER — TAMSULOSIN HCL 0.4 MG PO CAPS: 0.4000 mg | ORAL_CAPSULE | Freq: Every day | ORAL | Status: DC

## 2011-02-28 MED ORDER — VALSARTAN-HYDROCHLOROTHIAZIDE 320-25 MG PO TABS: 1.0000 | ORAL_TABLET | Freq: Every day | ORAL | Status: DC

## 2011-02-28 NOTE — Progress Notes (Signed)
  Subjective:    Patient ID: Calvin [name], male    DOB: 02-10-1946, 65 y.o.   [name]: [id number]  HPI 65 yr old male for a cpx. He feels fine and has no concerns.    Review of Systems  Constitutional: Negative.   HENT: Negative.   Eyes: Negative.   Respiratory: Negative.   Cardiovascular: Negative.   Gastrointestinal: Negative.   Genitourinary: Negative.   Musculoskeletal: Negative.   Skin: Negative.   Neurological: Negative.   Hematological: Negative.   Psychiatric/Behavioral: Negative.        Objective:   Physical Exam  Constitutional: He is oriented to person, place, and time. He appears well-developed and well-nourished. No distress.  HENT:  Head: Normocephalic and atraumatic.  Right Ear: External ear normal.  Left Ear: External ear normal.  Nose: Nose normal.  Mouth/Throat: Oropharynx is clear and moist. No oropharyngeal exudate.  Eyes: Conjunctivae and EOM are normal. Pupils are equal, round, and reactive to light. Right eye exhibits no discharge. Left eye exhibits no discharge. No scleral icterus.  Neck: Neck supple. No JVD present. No tracheal deviation present. No thyromegaly present.  Cardiovascular: Normal rate, regular rhythm, normal heart sounds and intact distal pulses.  Exam reveals no gallop and no friction rub.   No murmur heard.      EKG normal  Pulmonary/Chest: Effort normal and breath sounds normal. No respiratory distress. He has no wheezes. He has no rales. He exhibits no tenderness.  Abdominal: Soft. Bowel sounds are normal. He exhibits no distension and no mass. There is no tenderness. There is no rebound and no guarding.  Genitourinary: Rectum normal, prostate normal and penis normal. Guaiac negative stool. No penile tenderness.  Musculoskeletal: Normal range of motion. He exhibits no edema and no tenderness.  Lymphadenopathy:    He has no cervical adenopathy.  Neurological: He is alert and oriented to person, place, and time. He has normal reflexes.  No cranial nerve deficit. He exhibits normal muscle tone. Coordination normal.  Skin: Skin is warm and dry. No rash noted. He is not diaphoretic. No erythema. No pallor.  Psychiatric: He has a normal mood and affect. His behavior is normal. Judgment and thought content normal.          Assessment & Plan:  Get more exercise. Recheck lipids in 90 days

## 2011-02-28 NOTE — Progress Notes (Signed)
Addended by: Aniceto Boss A on: 02/28/2011 03:15 PM   Modules accepted: Orders

## 2011-03-16 LAB — URINALYSIS, ROUTINE W REFLEX MICROSCOPIC
Glucose, UA: NEGATIVE
Leukocytes, UA: NEGATIVE
Protein, ur: NEGATIVE
Specific Gravity, Urine: 1.027

## 2011-03-16 LAB — DIFFERENTIAL
Eosinophils Relative: 0
Lymphocytes Relative: 14
Monocytes Absolute: 0.2
Monocytes Relative: 3
Neutro Abs: 5.9

## 2011-03-16 LAB — COMPREHENSIVE METABOLIC PANEL
AST: 23
Albumin: 4.3
Calcium: 9.3
Creatinine, Ser: 0.87
GFR calc Af Amer: >60

## 2011-03-16 LAB — CBC
MCHC: 34.2
MCV: 98.3
Platelets: 224
WBC: 7.3

## 2011-03-16 LAB — URINE MICROSCOPIC-ADD ON

## 2011-05-16 ENCOUNTER — Telehealth: Payer: Self-pay | Admitting: Family Medicine

## 2011-05-16 NOTE — Telephone Encounter (Signed)
Pt has cough and chest congestion. Can I use sda slot for tomorrow?

## 2011-05-17 ENCOUNTER — Encounter: Payer: Self-pay | Admitting: Family Medicine

## 2011-05-17 ENCOUNTER — Ambulatory Visit (INDEPENDENT_AMBULATORY_CARE_PROVIDER_SITE_OTHER): Payer: BC Managed Care – PPO | Admitting: Family Medicine

## 2011-05-17 VITALS — BP 138/80 | HR 95 | Temp 98.3°F | Wt 220.0 lb

## 2011-05-17 DIAGNOSIS — J40 Bronchitis, not specified as acute or chronic: Secondary | ICD-10-CM

## 2011-05-17 MED ORDER — AZITHROMYCIN 250 MG PO TABS: ORAL_TABLET | ORAL | Status: AC

## 2011-05-17 MED ORDER — HYDROCODONE-HOMATROPINE 5-1.5 MG/5ML PO SYRP: 5.0000 mL | ORAL_SOLUTION | ORAL | Status: AC | PRN

## 2011-05-17 NOTE — Telephone Encounter (Signed)
That is okay.

## 2011-05-17 NOTE — Telephone Encounter (Signed)
Pt is sch for today 11.30am

## 2011-05-17 NOTE — Progress Notes (Signed)
  Subjective:    Patient ID: Calvin [name], male    DOB: [birth year], 65 y.o.   [name]: [id number]  HPI Here for one week of chest congestion and coughing up green sputum. No fever. Drinking fluids   Review of Systems  Constitutional: Negative.   HENT: Negative.   Eyes: Negative.   Respiratory: Positive for cough.        Objective:   Physical Exam  Constitutional: He appears well-developed and well-nourished.  HENT:  Right Ear: External ear normal.  Left Ear: External ear normal.  Nose: Nose normal.  Mouth/Throat: Oropharynx is clear and moist. No oropharyngeal exudate.  Eyes: Conjunctivae are normal.  Neck: No thyromegaly present.  Pulmonary/Chest: Effort normal. He has no wheezes. He has no rales.       Scattered rhonchi  Lymphadenopathy:    He has no cervical adenopathy.          Assessment & Plan:  Recheck prn

## 2011-05-19 ENCOUNTER — Other Ambulatory Visit: Payer: Self-pay | Admitting: Family Medicine

## 2011-05-30 ENCOUNTER — Other Ambulatory Visit (INDEPENDENT_AMBULATORY_CARE_PROVIDER_SITE_OTHER): Payer: BC Managed Care – PPO

## 2011-05-30 DIAGNOSIS — E785 Hyperlipidemia, unspecified: Secondary | ICD-10-CM

## 2011-05-30 LAB — LIPID PANEL
HDL: 62.5 mg/dL (ref >39.00)
Total CHOL/HDL Ratio: 2
VLDL: 14.2 mg/dL (ref 0.0–40.0)

## 2011-06-04 NOTE — Progress Notes (Signed)
Quick Note:  Spoke with pt and also he requested a copy of his medication list to be put in mail, which I did. ______

## 2011-09-06 ENCOUNTER — Other Ambulatory Visit: Payer: Self-pay

## 2012-03-03 ENCOUNTER — Other Ambulatory Visit: Payer: Self-pay | Admitting: Family Medicine

## 2012-03-12 ENCOUNTER — Observation Stay (HOSPITAL_COMMUNITY)
Admission: EM | Admit: 2012-03-12 | Discharge: 2012-03-13 | Disposition: A | Discharge status: Home / Self Care | Payer: BC Managed Care – PPO | Attending: Surgery | Admitting: Surgery

## 2012-03-12 ENCOUNTER — Emergency Department (HOSPITAL_COMMUNITY): Payer: BC Managed Care – PPO

## 2012-03-12 ENCOUNTER — Encounter (HOSPITAL_COMMUNITY): Payer: Self-pay | Admitting: Certified Registered Nurse Anesthetist

## 2012-03-12 ENCOUNTER — Ambulatory Visit (INDEPENDENT_AMBULATORY_CARE_PROVIDER_SITE_OTHER): Payer: BC Managed Care – PPO | Admitting: Family Medicine

## 2012-03-12 ENCOUNTER — Emergency Department (HOSPITAL_COMMUNITY): Payer: BC Managed Care – PPO | Admitting: Certified Registered Nurse Anesthetist

## 2012-03-12 ENCOUNTER — Encounter: Payer: Self-pay | Admitting: Family Medicine

## 2012-03-12 ENCOUNTER — Encounter (HOSPITAL_COMMUNITY)
Admission: EM | Disposition: A | Discharge status: Home / Self Care | Payer: Self-pay | Source: Home / Self Care | Attending: Emergency Medicine

## 2012-03-12 ENCOUNTER — Encounter (HOSPITAL_COMMUNITY): Payer: Self-pay | Admitting: Family Medicine

## 2012-03-12 VITALS — BP 124/88 | HR 108 | Temp 99.7°F | Wt 218.0 lb

## 2012-03-12 DIAGNOSIS — K3580 Unspecified acute appendicitis: Principal | ICD-10-CM | POA: Insufficient documentation

## 2012-03-12 DIAGNOSIS — K37 Unspecified appendicitis: Secondary | ICD-10-CM

## 2012-03-12 DIAGNOSIS — R109 Unspecified abdominal pain: Secondary | ICD-10-CM | POA: Insufficient documentation

## 2012-03-12 DIAGNOSIS — I10 Essential (primary) hypertension: Secondary | ICD-10-CM | POA: Insufficient documentation

## 2012-03-12 DIAGNOSIS — R1031 Right lower quadrant pain: Secondary | ICD-10-CM

## 2012-03-12 HISTORY — PX: LAPAROSCOPIC APPENDECTOMY: SHX408

## 2012-03-12 LAB — URINALYSIS, ROUTINE W REFLEX MICROSCOPIC
Ketones, ur: 15 mg/dL — AB
Nitrite: NEGATIVE
pH: 6 (ref 5.0–8.0)

## 2012-03-12 LAB — CBC WITH DIFFERENTIAL/PLATELET
Basophils Absolute: 0 10*3/uL (ref 0.0–0.1)
Basophils Relative: 0 % (ref 0–1)
MCHC: 35.5 g/dL (ref 30.0–36.0)
Monocytes Absolute: 1.5 10*3/uL — ABNORMAL HIGH (ref 0.1–1.0)
Neutro Abs: 8.8 10*3/uL — ABNORMAL HIGH (ref 1.7–7.7)
Neutrophils Relative %: 77 % (ref 43–77)
Platelets: 196 10*3/uL (ref 150–400)
RDW: 12.3 % (ref 11.5–15.5)

## 2012-03-12 LAB — BASIC METABOLIC PANEL
Chloride: 100 mEq/L (ref 96–112)
Creatinine, Ser: 0.78 mg/dL (ref 0.50–1.35)
GFR calc Af Amer: >90 mL/min (ref >90)
Potassium: 3.8 mEq/L (ref 3.5–5.1)

## 2012-03-12 LAB — POCT URINALYSIS DIPSTICK
Nitrite, UA: NEGATIVE
Urobilinogen, UA: 1
pH, UA: 7

## 2012-03-12 LAB — URINE MICROSCOPIC-ADD ON

## 2012-03-12 SURGERY — APPENDECTOMY, LAPAROSCOPIC
Anesthesia: General | Site: Abdomen | Wound class: Contaminated

## 2012-03-12 MED ORDER — FENTANYL CITRATE 0.05 MG/ML IJ SOLN
INTRAMUSCULAR | Status: DC | PRN
  Administered 2012-03-12 (×2): 50 ug via INTRAVENOUS
  Administered 2012-03-12: 100 ug via INTRAVENOUS

## 2012-03-12 MED ORDER — ONDANSETRON HCL 4 MG/2ML IJ SOLN: 4.0000 mg | Freq: Four times a day (QID) | INTRAMUSCULAR | Status: DC | PRN

## 2012-03-12 MED ORDER — AMLODIPINE BESYLATE 5 MG PO TABS: 5.0000 mg | ORAL_TABLET | Freq: Every day | ORAL | Status: DC

## 2012-03-12 MED ORDER — SODIUM CHLORIDE 0.9 % IV SOLN
1000.0000 mL | INTRAVENOUS | Status: DC
  Administered 2012-03-12 (×2): 1000 mL via INTRAVENOUS

## 2012-03-12 MED ORDER — MIDAZOLAM HCL 5 MG/5ML IJ SOLN
INTRAMUSCULAR | Status: DC | PRN
  Administered 2012-03-12: 2 mg via INTRAVENOUS

## 2012-03-12 MED ORDER — ONDANSETRON HCL 4 MG PO TABS: 4.0000 mg | ORAL_TABLET | Freq: Four times a day (QID) | ORAL | Status: DC | PRN

## 2012-03-12 MED ORDER — IBUPROFEN 600 MG PO TABS
600.0000 mg | ORAL_TABLET | Freq: Four times a day (QID) | ORAL | Status: DC | PRN
  Filled 2012-03-12: qty 1

## 2012-03-12 MED ORDER — HYDROMORPHONE HCL PF 1 MG/ML IJ SOLN: 0.2500 mg | INTRAMUSCULAR | Status: DC | PRN

## 2012-03-12 MED ORDER — IOHEXOL 300 MG/ML  SOLN
100.0000 mL | Freq: Once | INTRAMUSCULAR | Status: AC | PRN
  Administered 2012-03-12: 100 mL via INTRAVENOUS

## 2012-03-12 MED ORDER — ONDANSETRON HCL 4 MG/2ML IJ SOLN
4.0000 mg | Freq: Once | INTRAMUSCULAR | Status: AC
  Administered 2012-03-12: 4 mg via INTRAVENOUS
  Filled 2012-03-12: qty 2

## 2012-03-12 MED ORDER — POTASSIUM CHLORIDE IN NACL 20-0.9 MEQ/L-% IV SOLN
INTRAVENOUS | Status: DC
  Administered 2012-03-12: 23:00:00 via INTRAVENOUS
  Filled 2012-03-12 (×3): qty 1000

## 2012-03-12 MED ORDER — OXYCODONE HCL 5 MG/5ML PO SOLN: 5.0000 mg | Freq: Once | ORAL | Status: DC | PRN

## 2012-03-12 MED ORDER — SODIUM CHLORIDE 0.9 % IV SOLN
1.0000 g | INTRAVENOUS | Status: AC
  Administered 2012-03-12: 1 g via INTRAVENOUS
  Filled 2012-03-12: qty 1

## 2012-03-12 MED ORDER — SODIUM CHLORIDE 0.9 % IV SOLN
1000.0000 mL | Freq: Once | INTRAVENOUS | Status: AC
  Administered 2012-03-12: 1000 mL via INTRAVENOUS

## 2012-03-12 MED ORDER — BUPIVACAINE-EPINEPHRINE (PF) 0.5% -1:200000 IJ SOLN
INTRAMUSCULAR | Status: AC
  Filled 2012-03-12: qty 10

## 2012-03-12 MED ORDER — IRBESARTAN 300 MG PO TABS
300.0000 mg | ORAL_TABLET | Freq: Every day | ORAL | Status: DC
  Administered 2012-03-13: 300 mg via ORAL
  Filled 2012-03-12 (×2): qty 1

## 2012-03-12 MED ORDER — HYDROMORPHONE HCL PF 1 MG/ML IJ SOLN: 1.0000 mg | INTRAMUSCULAR | Status: DC | PRN

## 2012-03-12 MED ORDER — TAMSULOSIN HCL 0.4 MG PO CAPS
0.4000 mg | ORAL_CAPSULE | Freq: Every day | ORAL | Status: DC
Start: 2012-03-12 — End: 2012-03-13
  Administered 2012-03-13: 0.4 mg via ORAL
  Filled 2012-03-12 (×2): qty 1

## 2012-03-12 MED ORDER — ROCURONIUM BROMIDE 100 MG/10ML IV SOLN
INTRAVENOUS | Status: DC | PRN
  Administered 2012-03-12: 30 mg via INTRAVENOUS

## 2012-03-12 MED ORDER — DUTASTERIDE 0.5 MG PO CAPS
0.5000 mg | ORAL_CAPSULE | Freq: Every day | ORAL | Status: DC
  Administered 2012-03-13: 0.5 mg via ORAL
  Filled 2012-03-12 (×2): qty 1

## 2012-03-12 MED ORDER — LIDOCAINE HCL (CARDIAC) 20 MG/ML IV SOLN
INTRAVENOUS | Status: DC | PRN
  Administered 2012-03-12: 50 mg via INTRAVENOUS

## 2012-03-12 MED ORDER — EPHEDRINE SULFATE 50 MG/ML IJ SOLN
INTRAMUSCULAR | Status: DC | PRN
  Administered 2012-03-12: 5 mg via INTRAVENOUS

## 2012-03-12 MED ORDER — ERTAPENEM SODIUM 1 G IJ SOLR
1.0000 g | INTRAMUSCULAR | Status: DC
  Filled 2012-03-12: qty 1

## 2012-03-12 MED ORDER — NEOSTIGMINE METHYLSULFATE 1 MG/ML IJ SOLN
INTRAMUSCULAR | Status: DC | PRN
  Administered 2012-03-12: 5 mg via INTRAVENOUS

## 2012-03-12 MED ORDER — OXYCODONE HCL 5 MG PO TABS: 5.0000 mg | ORAL_TABLET | Freq: Once | ORAL | Status: DC | PRN

## 2012-03-12 MED ORDER — HYDROMORPHONE HCL PF 1 MG/ML IJ SOLN
1.0000 mg | Freq: Once | INTRAMUSCULAR | Status: AC
  Administered 2012-03-12: 1 mg via INTRAVENOUS
  Filled 2012-03-12: qty 1

## 2012-03-12 MED ORDER — SUCCINYLCHOLINE CHLORIDE 20 MG/ML IJ SOLN
INTRAMUSCULAR | Status: DC | PRN
  Administered 2012-03-12: 100 mg via INTRAVENOUS

## 2012-03-12 MED ORDER — GLYCOPYRROLATE 0.2 MG/ML IJ SOLN
INTRAMUSCULAR | Status: DC | PRN
  Administered 2012-03-12: .8 mg via INTRAVENOUS

## 2012-03-12 MED ORDER — SODIUM CHLORIDE 0.9 % IR SOLN
Status: DC | PRN
  Administered 2012-03-12: 1000 mL

## 2012-03-12 MED ORDER — HYDROCODONE-ACETAMINOPHEN 5-325 MG PO TABS
1.0000 | ORAL_TABLET | ORAL | Status: DC | PRN
  Administered 2012-03-13 (×2): 2 via ORAL
  Filled 2012-03-12 (×2): qty 2
  Filled 2012-03-12: qty 1

## 2012-03-12 MED ORDER — PROPOFOL 10 MG/ML IV BOLUS
INTRAVENOUS | Status: DC | PRN
  Administered 2012-03-12: 150 mg via INTRAVENOUS

## 2012-03-12 MED ORDER — AMLODIPINE BESYLATE 5 MG PO TABS
5.0000 mg | ORAL_TABLET | Freq: Every day | ORAL | Status: DC
  Administered 2012-03-13: 5 mg via ORAL
  Filled 2012-03-12 (×2): qty 1

## 2012-03-12 MED ORDER — VALSARTAN-HYDROCHLOROTHIAZIDE 320-25 MG PO TABS: 1.0000 | ORAL_TABLET | Freq: Every day | ORAL | Status: DC

## 2012-03-12 MED ORDER — LACTATED RINGERS IV SOLN
INTRAVENOUS | Status: DC | PRN
  Administered 2012-03-12 (×2): via INTRAVENOUS

## 2012-03-12 MED ORDER — BUPIVACAINE-EPINEPHRINE 0.5% -1:200000 IJ SOLN
INTRAMUSCULAR | Status: DC | PRN
  Administered 2012-03-12: 20 mL

## 2012-03-12 MED ORDER — ENOXAPARIN SODIUM 40 MG/0.4ML ~~LOC~~ SOLN
40.0000 mg | SUBCUTANEOUS | Status: DC
  Filled 2012-03-12: qty 0.4

## 2012-03-12 MED ORDER — HYDROCHLOROTHIAZIDE 25 MG PO TABS
25.0000 mg | ORAL_TABLET | Freq: Every day | ORAL | Status: DC
  Administered 2012-03-13: 25 mg via ORAL
  Filled 2012-03-12 (×2): qty 1

## 2012-03-12 MED ORDER — ONDANSETRON HCL 4 MG/2ML IJ SOLN
INTRAMUSCULAR | Status: DC | PRN
  Administered 2012-03-12: 4 mg via INTRAVENOUS

## 2012-03-12 MED ORDER — DUTASTERIDE 0.5 MG PO CAPS: 0.5000 mg | ORAL_CAPSULE | Freq: Every day | ORAL | Status: DC

## 2012-03-12 SURGICAL SUPPLY — 48 items
APL SKNCLS STERI-STRIP NONHPOA (GAUZE/BANDAGES/DRESSINGS) ×1
APPLIER CLIP LOGIC TI 5 (MISCELLANEOUS) IMPLANT
APPLIER CLIP ROT 10 11.4 M/L (STAPLE)
APR CLP MED LRG 11.4X10 (STAPLE)
APR CLP MED LRG 33X5 (MISCELLANEOUS)
BAG SPEC RTRVL LRG 6X4 10 (ENDOMECHANICALS) ×1
BANDAGE ADHESIVE 1X3 (GAUZE/BANDAGES/DRESSINGS) ×6 IMPLANT
BENZOIN TINCTURE PRP APPL 2/3 (GAUZE/BANDAGES/DRESSINGS) ×2 IMPLANT
BNDG ADH 5X2 AIR PERM ELC (GAUZE/BANDAGES/DRESSINGS) ×1
BNDG COHESIVE 2X5 WHT NS (GAUZE/BANDAGES/DRESSINGS) ×1 IMPLANT
CANISTER SUCTION 2500CC (MISCELLANEOUS) ×2 IMPLANT
CHLORAPREP W/TINT 26ML (MISCELLANEOUS) ×2 IMPLANT
CLIP APPLIE ROT 10 11.4 M/L (STAPLE) IMPLANT
CLOSURE STERI-STRIP 1/4X4 (GAUZE/BANDAGES/DRESSINGS) ×1 IMPLANT
CLOTH BEACON ORANGE TIMEOUT ST (SAFETY) ×2 IMPLANT
COVER SURGICAL LIGHT HANDLE (MISCELLANEOUS) ×2 IMPLANT
CUTTER LINEAR ENDO 35 ETS (STAPLE) ×1 IMPLANT
CUTTER LINEAR ENDO 35 ETS TH (STAPLE) IMPLANT
DECANTER SPIKE VIAL GLASS SM (MISCELLANEOUS) ×2 IMPLANT
ELECT REM PT RETURN 9FT ADLT (ELECTROSURGICAL) ×2
ELECTRODE REM PT RTRN 9FT ADLT (ELECTROSURGICAL) ×1 IMPLANT
ENDOLOOP SUT PDS II  0 18 (SUTURE)
ENDOLOOP SUT PDS II 0 18 (SUTURE) IMPLANT
GLOVE BIO SURGEON STRL SZ 6.5 (GLOVE) ×1 IMPLANT
GLOVE BIO SURGEON STRL SZ7.5 (GLOVE) ×1 IMPLANT
GLOVE BIOGEL PI IND STRL 7.5 (GLOVE) IMPLANT
GLOVE BIOGEL PI INDICATOR 7.5 (GLOVE) ×1
GLOVE SURG SIGNA 7.5 PF LTX (GLOVE) ×2 IMPLANT
GOWN PREVENTION PLUS XLARGE (GOWN DISPOSABLE) ×2 IMPLANT
GOWN STRL NON-REIN LRG LVL3 (GOWN DISPOSABLE) ×2 IMPLANT
KIT BASIN OR (CUSTOM PROCEDURE TRAY) ×2 IMPLANT
KIT ROOM TURNOVER OR (KITS) ×2 IMPLANT
NS IRRIG 1000ML POUR BTL (IV SOLUTION) ×2 IMPLANT
PAD ARMBOARD 7.5X6 YLW CONV (MISCELLANEOUS) ×4 IMPLANT
POUCH SPECIMEN RETRIEVAL 10MM (ENDOMECHANICALS) ×2 IMPLANT
RELOAD /EVU35 (ENDOMECHANICALS) IMPLANT
RELOAD CUTTER ETS 35MM STAND (ENDOMECHANICALS) IMPLANT
SCALPEL HARMONIC ACE (MISCELLANEOUS) ×2 IMPLANT
SET IRRIG TUBING LAPAROSCOPIC (IRRIGATION / IRRIGATOR) ×2 IMPLANT
SLEEVE ENDOPATH XCEL 5M (ENDOMECHANICALS) ×2 IMPLANT
SPECIMEN JAR SMALL (MISCELLANEOUS) ×2 IMPLANT
SUT MON AB 4-0 PC3 18 (SUTURE) ×2 IMPLANT
TOWEL OR 17X24 6PK STRL BLUE (TOWEL DISPOSABLE) ×2 IMPLANT
TOWEL OR 17X26 10 PK STRL BLUE (TOWEL DISPOSABLE) ×2 IMPLANT
TRAY LAPAROSCOPIC (CUSTOM PROCEDURE TRAY) ×2 IMPLANT
TROCAR XCEL BLUNT TIP 100MML (ENDOMECHANICALS) ×2 IMPLANT
TROCAR XCEL NON-BLD 5MMX100MML (ENDOMECHANICALS) ×2 IMPLANT
WATER STERILE IRR 1000ML POUR (IV SOLUTION) IMPLANT

## 2012-03-12 NOTE — ED Provider Notes (Signed)
History     CSN: [id number]  Arrival date & time 03/12/12  1113   First MD Initiated Contact with Patient 03/12/12 1133      Chief Complaint  Patient presents with  . Abdominal Pain     The history is provided by the patient.   patient reports worsening right lower quadrant pain over the past 24 hours.  He denies nausea vomiting diarrhea.  His had low-grade fever today.  He's had anorexia.  He would see his primary care physician he was concerned about appendicitis and thus sent him to the emergency department for evaluation.  The patient's pain is mild to moderate at this time.  His pain is worsened by movement and palpation his pain is relieved by nothing.  He denies dysuria and urinary frequency.  He has no flank pain.  His other symptoms.  Past Medical History  Diagnosis Date  . Headache   . Hypertension   . ED (erectile dysfunction)   . Nephrolithiasis     hx of had lithotripsy in 1-10.  Marland Kitchen Benign prostatic hypertrophy   . Hypogonadism male     Past Surgical History  Procedure Date  . Tonsillectomy   . Colonoscopy 03-28-10    per Dr. Russella Dar, hyperplastic polyps, repeat in 10 yrs   . Staple hemorrhoidectomy 06-02-10    per Dr. Russella Dar     Family History  Problem Relation Age of Onset  . Hypertension Other   . Stroke Other   . Heart disease Other     rheumatic   . Coronary artery disease Other     History  Substance Use Topics  . Smoking status: Never Smoker   . Smokeless tobacco: Never Used  . Alcohol Use: 3.5 oz/week    7 drink(s) per week      Review of Systems  Gastrointestinal: Positive for abdominal pain.  All other systems reviewed and are negative.    Allergies  Sulfonamide derivatives  Home Medications   Current Outpatient Rx  Name Route Sig Dispense Refill  . AMLODIPINE BESYLATE 5 MG PO TABS Oral Take 5 mg by mouth daily.    . ASPIRIN 81 MG PO TABS Oral Take 81 mg by mouth daily.      . DUTASTERIDE 0.5 MG PO CAPS Oral Take 0.5 mg by  mouth daily.    Marland Kitchen MELATONIN PO Oral Take 1 capsule by mouth at bedtime.    . TAMSULOSIN HCL 0.4 MG PO CAPS Oral Take 0.4 mg by mouth daily.    Marland Kitchen VALSARTAN-HYDROCHLOROTHIAZIDE 320-25 MG PO TABS Oral Take 1 tablet by mouth daily.    Marland Kitchen AMLODIPINE BESYLATE 5 MG PO TABS Oral Take 1 tablet (5 mg total) by mouth daily. 30 tablet 11  . ATORVASTATIN CALCIUM 20 MG PO TABS Oral Take 1 tablet (20 mg total) by mouth daily. 90 tablet 3  . SILDENAFIL CITRATE 100 MG PO TABS Oral Take 1 tablet (100 mg total) by mouth as needed for erectile dysfunction. 10 tablet 11    BP 128/86  Pulse 95  Temp 98.8 F (37.1 C) (Oral)  Resp 18  SpO2 100%  Physical Exam  Nursing note and vitals reviewed. Constitutional: He is oriented to person, place, and time. He appears well-developed and well-nourished.  HENT:  Head: Normocephalic and atraumatic.  Eyes: EOM are normal.  Neck: Normal range of motion.  Cardiovascular: Normal rate, regular rhythm, normal heart sounds and intact distal pulses.   Pulmonary/Chest: Effort normal and breath sounds normal.  No respiratory distress.  Abdominal: Soft. He exhibits no distension.       Right lower quadrant abdominal tenderness with guarding or rebound.  No peritonitis on exam  Musculoskeletal: Normal range of motion.  Neurological: He is alert and oriented to person, place, and time.  Skin: Skin is warm and dry.  Psychiatric: He has a normal mood and affect. Judgment normal.    ED Course  Procedures (including critical care time)   Date: 03/12/2012  Rate: 95  Rhythm: normal sinus rhythm  QRS Axis: normal  Intervals: normal  ST/T Wave abnormalities: normal  Conduction Disutrbances: none  Narrative Interpretation:   Old EKG Reviewed: no old ecg      Labs Reviewed  CBC WITH DIFFERENTIAL   Dg Chest 1 View  03/12/2012  *RADIOLOGY REPORT*  Clinical Data: Abdominal pain.  CHEST - 1 VIEW  Comparison: None.  Findings: Low lung volumes with bibasilar atelectasis and  accentuation heart size.  No effusions.  No acute bony abnormality.  IMPRESSION: Low lung volumes, bibasilar atelectasis.   Original Report Authenticated By: Cyndie Chime, M.D.    Ct Abdomen Pelvis W Contrast  03/12/2012  *RADIOLOGY REPORT*  Clinical Data: Fever, right lower abdominal pain  CT ABDOMEN AND PELVIS WITH CONTRAST  Technique:  Multidetector CT imaging of the abdomen and pelvis was performed following the standard protocol during bolus administration of intravenous contrast.  Contrast: OMNIPAQUE IOHEXOL 300 MG/ML  SOLN  Comparison: 01/02/2008  Findings: Mild patchy opacities at the lung bases, possibly atelectasis / scarring.  At least four probable hepatic cysts measuring up to 1.3 cm in the left hepatic lobe (series 2/image 26).  Calcified splenic granuloma (series 2/image 25).  Spleen, pancreas, and left adrenal gland are within normal limits. 1.5 x 1.3 cm right adrenal nodule, incompletely characterized but likely reflecting an adrenal adenoma, minimally increased from 2009 (previously 1.2 x 1.2 cm).  2.8 x 2.3 cm left pole renal cyst.  Right kidney is unremarkable. No hydronephrosis.  No evidence of bowel obstruction.  Dilated appendix, measuring up to 1.6 cm (series 2/image 64), with surrounding inflammatory changes and secondary inflammation involving the terminal ileum. Given the degree of surrounding inflammatory stranding (series 2/image 63), a microperforation cannot be excluded, although there is no free air or abscess.  Atherosclerotic calcifications of the abdominal aorta and branch vessels.  No abdominopelvic ascites.  No suspicious abdominopelvic lymphadenopathy.  Prostatomegaly, measuring 5.9 cm in transverse dimension, and indenting the base of the bladder.  Bladder is mildly thick-walled, possibly reflecting chronic outlet obstruction.  Small fat-containing right inguinal hernia.  Small fat-containing midline supra umbilical hernia (series 2/image 64).  Mild degenerative  changes of the visualized thoracolumbar spine.  IMPRESSION:  Acute appendicitis.  No free air or abscess, although early microperforation cannot be excluded.  Additional ancillary findings as above.   Original Report Authenticated By: Charline Bills, M.D.      1. Appendicitis       MDM  Concerning for appendicitis.  Pain medicine.  N.p.o.  CT scan pending at this time.  Labs pending.        Lyanne Co, MD 03/13/12 [phone]

## 2012-03-12 NOTE — ED Notes (Signed)
RN in OR states they are ready for pt

## 2012-03-12 NOTE — Transfer of Care (Signed)
Immediate Anesthesia Transfer of Care Note  Patient: Calvin [name]  Procedure(s) Performed: Procedure(s) (LRB) with comments: APPENDECTOMY LAPAROSCOPIC (N/A)  Patient Location: PACU  Anesthesia Type: General  Level of Consciousness: awake, alert  and patient cooperative  Airway & Oxygen Therapy: Patient Spontanous Breathing and Patient connected to face mask oxygen  Post-op Assessment: Report given to PACU RN, Post -op Vital signs reviewed and stable and Patient moving all extremities X 4  Post vital signs: Reviewed and stable  Complications: No apparent anesthesia complications

## 2012-03-12 NOTE — Anesthesia Preprocedure Evaluation (Addendum)
Anesthesia Evaluation  Patient identified by [name], date of birth, ID band Patient awake    Reviewed: Allergy & Precautions, H&P , NPO status , Patient's Chart, lab work & pertinent test results  Airway Mallampati: I TM Distance: >3 FB Neck ROM: Full    Dental  (+) Teeth Intact and Dental Advisory Given   Pulmonary  breath sounds clear to auscultation        Cardiovascular hypertension, Pt. on medications Rhythm:Regular Rate:Normal     Neuro/Psych    GI/Hepatic appendicitis   Endo/Other    Renal/GU      Musculoskeletal   Abdominal   Peds  Hematology   Anesthesia Other Findings   Reproductive/Obstetrics                         Anesthesia Physical Anesthesia Plan  ASA: II and Emergent  Anesthesia Plan: General   Post-op Pain Management:    Induction: Intravenous  Airway Management Planned: Oral ETT  Additional Equipment:   Intra-op Plan:   Post-operative Plan: Extubation in OR  Informed Consent: I have reviewed the patients History and Physical, chart, labs and discussed the procedure including the risks, benefits and alternatives for the proposed anesthesia with the patient or authorized representative who has indicated his/her understanding and acceptance.   Dental advisory given  Plan Discussed with: CRNA, Anesthesiologist and Surgeon  Anesthesia Plan Comments:        Anesthesia Quick Evaluation

## 2012-03-12 NOTE — Anesthesia Postprocedure Evaluation (Signed)
  Anesthesia Post-op Note  Patient: Calvin [name]  Procedure(s) Performed: Procedure(s) (LRB) with comments: APPENDECTOMY LAPAROSCOPIC (N/A)  Patient Location: PACU  Anesthesia Type: General  Level of Consciousness: awake and alert   Airway and Oxygen Therapy: Patient Spontanous Breathing and Patient connected to nasal cannula oxygen  Post-op Pain: mild  Post-op Assessment: Post-op Vital signs reviewed, Patient's Cardiovascular Status Stable, Respiratory Function Stable, Patent Airway and No signs of Nausea or vomiting  Post-op Vital Signs: Reviewed and stable  Complications: No apparent anesthesia complications

## 2012-03-12 NOTE — Addendum Note (Signed)
Addended by: Aniceto Boss A on: 03/12/2012 10:57 AM   Modules accepted: Orders

## 2012-03-12 NOTE — Preoperative (Signed)
Beta Blockers   Reason not to administer Beta Blockers:Not Applicable 

## 2012-03-12 NOTE — ED Notes (Signed)
Pt sent here to R/O appendicitis. Pt having RLQ pain. Denies N,V,D.

## 2012-03-12 NOTE — Op Note (Signed)
Appendectomy, Lap, Procedure Note  Indications: The patient presented with a history of right-sided abdominal pain. A CT revealed findings consistent with acute appendicitis.  Pre-operative Diagnosis: Acute appendicitis without mention of peritonitis  Post-operative Diagnosis: Same  Surgeon: Abigail Miyamoto A   Assistants: 0  Anesthesia: General endotracheal anesthesia  ASA Class: 2  Procedure Details  The patient was seen again in the Holding Room. The risks, benefits, complications, treatment options, and expected outcomes were discussed with the patient and/or family. The possibilities of reaction to medication, perforation of viscus, bleeding, recurrent infection, finding a normal appendix, the need for additional procedures, failure to diagnose a condition, and creating a complication requiring transfusion or operation were discussed. There was concurrence with the proposed plan and informed consent was obtained. The site of surgery was properly noted. The patient was taken to Operating Room, identified as Calvin [name] and the procedure verified as Appendectomy. A Time Out was held and the above information confirmed.  The patient was placed in the supine position and general anesthesia was induced, along with placement of orogastric tube, Venodyne boots, and a Foley catheter. The abdomen was prepped and draped in a sterile fashion. A one centimeter infraumbilical incision was made.  The umbilical stalk was elevated, and the midline fascia was incised with a #11 blade.  A Kelly clamp was used to confirm entrance into the peritoneal cavity.  A pursestring suture was passed around the incision with a 0 Vicryl.  The Hasson was introduced into the abdomen and the tails of the suture were used to hold the Hasson in place.   The pneumoperitoneum was then established to steady pressure of 15 mmHg.  Additional 5 mm cannulas then placed in the left lower quadrant of the abdomen and the suprapubic  region under direct visualization. A careful evaluation of the entire abdomen was carried out. The patient was placed in Trendelenburg and left lateral decubitus position. The small intestines were retracted in the cephalad and left lateral direction away from the pelvis and right lower quadrant. The patient was found to have an enlarged and inflamed appendix that was extending into the pelvis. There was no evidence of perforation.  The appendix was carefully dissected. The appendix was was skeletonized with the harmonic scalpel.   The appendix was divided at its base using an endo-GIA stapler. Minimal appendiceal stump was left in place. There was no evidence of bleeding, leakage, or complication after division of the appendix. Irrigation was also performed and irrigate suctioned from the abdomen as well.  The umbilical port site was closed with the purse string suture. There was no residual palpable fascial defect.  The trocar site skin wounds were closed with 4-0 Monocryl.  Instrument, sponge, and needle counts were correct at the conclusion of the case.   Findings: The appendix was found to be inflamed. There were not signs of necrosis.  There was not perforation. There was abscess formation.  Estimated Blood Loss:  Minimal         Drains:0         Complications:  None; patient tolerated the procedure well.         Disposition: PACU - hemodynamically stable.         Condition: stable

## 2012-03-12 NOTE — H&P (Addendum)
Calvin [name] is an 66 y.o. male.  Chief Complaint: Right lower quadrant abdominal pain HPI: This gentleman presents with a 24-hour history of lower abdominal pain. It started in the suprapubic area and has since moved to the right lower quadrant. It is sharp and constant. It is moderate in intensity. He has no nausea or vomiting. He is otherwise without complaints.  Past Medical History  Diagnosis Date  . Headache   . Hypertension   . ED (erectile dysfunction)   . Nephrolithiasis     hx of had lithotripsy in 1-10.  Marland Kitchen Benign prostatic hypertrophy   . Hypogonadism male     Past Surgical History  Procedure Date  . Tonsillectomy   . Colonoscopy 03-28-10    per Dr. Russella Dar, hyperplastic polyps, repeat in 10 yrs   . Staple hemorrhoidectomy 06-02-10    per Dr. Russella Dar     Family History  Problem Relation Age of Onset  . Hypertension Other   . Stroke Other   . Heart disease Other     rheumatic   . Coronary artery disease Other    Social History:  reports that he has never smoked. He has never used smokeless tobacco. He reports that he drinks about 3.5 ounces of alcohol per week. He reports that he does not use illicit drugs.  Allergies:  Allergies  Allergen Reactions  . Sulfonamide Derivatives Other (See Comments)    Reaction unknown     (Not in a hospital admission)  Results for orders placed during the hospital encounter of 03/12/12 (from the past 48 hour(s))  CBC WITH DIFFERENTIAL     Status: Abnormal   Collection Time   03/12/12 11:53 AM      Component Value Range Comment   WBC 11.5 (*) 4.0 - 10.5 K/uL    RBC 4.37  4.22 - 5.81 MIL/uL    Hemoglobin 15.0  13.0 - 17.0 g/dL    HCT 19.1  47.8 - 29.5 %    MCV 96.8  78.0 - 100.0 fL    MCH 34.3 (*) 26.0 - 34.0 pg    MCHC 35.5  30.0 - 36.0 g/dL    RDW 62.1  30.8 - 65.7 %    Platelets 196  150 - 400 K/uL    Neutrophils Relative 77  43 - 77 %    Neutro Abs 8.8 (*) 1.7 - 7.7 K/uL    Lymphocytes Relative 10 (*) 12 - 46 %    Lymphs Abs 1.1  0.7 - 4.0 K/uL    Monocytes Relative 13 (*) 3 - 12 %    Monocytes Absolute 1.5 (*) 0.1 - 1.0 K/uL    Eosinophils Relative 0  0 - 5 %    Eosinophils Absolute 0.0  0.0 - 0.7 K/uL    Basophils Relative 0  0 - 1 %    Basophils Absolute 0.0  0.0 - 0.1 K/uL   BASIC METABOLIC PANEL     Status: Abnormal   Collection Time   03/12/12 12:32 PM      Component Value Range Comment   Sodium 133 (*) 135 - 145 mEq/L    Potassium 3.8  3.5 - 5.1 mEq/L    Chloride 100  96 - 112 mEq/L    CO2 22  19 - 32 mEq/L    Glucose, Bld 109 (*) 70 - 99 mg/dL    BUN 15  6 - 23 mg/dL    Creatinine, Ser 8.46  0.50 - 1.35 mg/dL  Calcium 8.8  8.4 - 10.5 mg/dL    GFR calc non Af Amer >90  >90 mL/min    GFR calc Af Amer >90  >90 mL/min   URINALYSIS, ROUTINE W REFLEX MICROSCOPIC     Status: Abnormal   Collection Time   03/12/12 12:50 PM      Component Value Range Comment   Color, Urine AMBER (*) YELLOW BIOCHEMICALS MAY BE AFFECTED BY COLOR   APPearance CLEAR  CLEAR    Specific Gravity, Urine 1.025  1.005 - 1.030    pH 6.0  5.0 - 8.0    Glucose, UA NEGATIVE  NEGATIVE mg/dL    Hgb urine dipstick NEGATIVE  NEGATIVE    Bilirubin Urine SMALL (*) NEGATIVE    Ketones, ur 15 (*) NEGATIVE mg/dL    Protein, ur NEGATIVE  NEGATIVE mg/dL    Urobilinogen, UA 1.0  0.0 - 1.0 mg/dL    Nitrite NEGATIVE  NEGATIVE    Leukocytes, UA MODERATE (*) NEGATIVE   URINE MICROSCOPIC-ADD ON     Status: Abnormal   Collection Time   03/12/12 12:50 PM      Component Value Range Comment   Squamous Epithelial / LPF FEW (*) RARE    WBC, UA 11-20  <3 WBC/hpf    Bacteria, UA MANY (*) RARE    Urine-Other MUCOUS PRESENT      Dg Chest 1 View  03/12/2012  *RADIOLOGY REPORT*  Clinical Data: Abdominal pain.  CHEST - 1 VIEW  Comparison: None.  Findings: Low lung volumes with bibasilar atelectasis and accentuation heart size.  No effusions.  No acute bony abnormality.  IMPRESSION: Low lung volumes, bibasilar atelectasis.   Original Report  Authenticated By: Cyndie Chime, M.D.    Ct Abdomen Pelvis W Contrast  03/12/2012  *RADIOLOGY REPORT*  Clinical Data: Fever, right lower abdominal pain  CT ABDOMEN AND PELVIS WITH CONTRAST  Technique:  Multidetector CT imaging of the abdomen and pelvis was performed following the standard protocol during bolus administration of intravenous contrast.  Contrast: OMNIPAQUE IOHEXOL 300 MG/ML  SOLN  Comparison: 01/02/2008  Findings: Mild patchy opacities at the lung bases, possibly atelectasis / scarring.  At least four probable hepatic cysts measuring up to 1.3 cm in the left hepatic lobe (series 2/image 26).  Calcified splenic granuloma (series 2/image 25).  Spleen, pancreas, and left adrenal gland are within normal limits. 1.5 x 1.3 cm right adrenal nodule, incompletely characterized but likely reflecting an adrenal adenoma, minimally increased from 2009 (previously 1.2 x 1.2 cm).  2.8 x 2.3 cm left pole renal cyst.  Right kidney is unremarkable. No hydronephrosis.  No evidence of bowel obstruction.  Dilated appendix, measuring up to 1.6 cm (series 2/image 64), with surrounding inflammatory changes and secondary inflammation involving the terminal ileum. Given the degree of surrounding inflammatory stranding (series 2/image 63), a microperforation cannot be excluded, although there is no free air or abscess.  Atherosclerotic calcifications of the abdominal aorta and branch vessels.  No abdominopelvic ascites.  No suspicious abdominopelvic lymphadenopathy.  Prostatomegaly, measuring 5.9 cm in transverse dimension, and indenting the base of the bladder.  Bladder is mildly thick-walled, possibly reflecting chronic outlet obstruction.  Small fat-containing right inguinal hernia.  Small fat-containing midline supra umbilical hernia (series 2/image 64).  Mild degenerative changes of the visualized thoracolumbar spine.  IMPRESSION:  Acute appendicitis.  No free air or abscess, although early microperforation cannot  be excluded.  Additional ancillary findings as above.   Original Report Authenticated By:  Charline Bills, M.D.     Review of Systems  Constitutional: Negative.  Negative for fever.  HENT: Negative.   Eyes: Negative.   Respiratory: Negative.   Cardiovascular: Negative.   Gastrointestinal: Positive for abdominal pain. Negative for nausea, vomiting and diarrhea.  Genitourinary: Negative.   Musculoskeletal: Negative.   Neurological: Negative.   Psychiatric/Behavioral: Negative.     Blood pressure 132/82, pulse 90, temperature 98 F (36.7 C), temperature source Oral, resp. rate 18, SpO2 96.00%. Physical Exam  Constitutional: He is oriented to person, place, and time. He appears well-developed and well-nourished. No distress.  HENT:  Head: Normocephalic and atraumatic.  Right Ear: External ear normal.  Left Ear: External ear normal.  Nose: Nose normal.  Mouth/Throat: Oropharynx is clear and moist. No oropharyngeal exudate.  Eyes: Conjunctivae normal are normal. Pupils are equal, round, and reactive to light. Right eye exhibits no discharge. Left eye exhibits no discharge. No scleral icterus.  Neck: Normal range of motion. Neck supple. No tracheal deviation present. No thyromegaly present.  Cardiovascular: Normal rate, regular rhythm, normal heart sounds and intact distal pulses.   No murmur heard. Respiratory: Effort normal. No respiratory distress. He has no wheezes. He has no rales.  GI: Soft. There is tenderness. There is guarding. There is no rebound.       Tenderness with guarding in the RLQ  Musculoskeletal: Normal range of motion. He exhibits no edema and no tenderness.  Lymphadenopathy:    He has no cervical adenopathy.  Neurological: He is alert and oriented to person, place, and time.  Skin: He is not diaphoretic.  Psychiatric: His behavior is normal. Judgment normal.     Assessment/Plan Acute appendicitis  We'll proceed to the operating room for laparoscopic  appendectomy. I discussed this with the patient in detail. I discussed the risks of surgery which includes but is not limited to bleeding, infection, appendiceal stump leak, injury to other structures, need to convert to an open procedure, et Karie Soda. He understands and wishes to proceed. Surgery will be scheduled. Preoperative antibiotics will be given.  Walden Statz A 03/12/2012, 6:31 PM

## 2012-03-12 NOTE — ED Provider Notes (Signed)
Patient with a hx sig for hypertension and BPH presented to the emergency department complaining of right lower quadrant abdominal pain. Onset was approximately 24 hours ago and symptoms have been gradually worsening.  He was placed in CDU by Dr. Patria Mane pending a CT abdomen to rule out likely diagnosis of appendicitis. Patient care resumed from pod B/D.  Patient is here for pain management pending results of CT and has received Dilaudid and Zofran x1. Patient re-evaluated and is resting comfortable, VSS, with no new complaints or concerns at this time. Plan per previous provider is to disposition according to CT results. On exam: hemodynamically stable, NAD, heart w/ RRR, lungs CTAB, Chest & abd w significant RLQ ttp but no peritoneal signs, no peripheral edema or calf tenderness.  CBC    Component Value Date/Time   WBC 11.5* 03/12/2012 1153   RBC 4.37 03/12/2012 1153   HGB 15.0 03/12/2012 1153   HCT 42.3 03/12/2012 1153   PLT 196 03/12/2012 1153   MCV 96.8 03/12/2012 1153   MCH 34.3* 03/12/2012 1153   MCHC 35.5 03/12/2012 1153   RDW 12.3 03/12/2012 1153   LYMPHSABS 1.1 03/12/2012 1153   MONOABS 1.5* 03/12/2012 1153   EOSABS 0.0 03/12/2012 1153   BASOSABS 0.0 03/12/2012 1153   Urine dipstick shows positive for WBC's, positive for leukocytes and positive for ketones.  Micro exam: 11-20 WBC's per HPF and MANY+ bacteria.   4:30 PM CT Abd/Pelvis IMPRESSION: Will Acute appendicitis. No free air or abscess, although early microperforation cannot be excluded. Additional ancillary findings as above. Original Report Authenticated By: Charline Bills, M.D.  Consult to Gen surgery for above results. Discussed results with patient & wife. Ordered to keep NPO. Last meal was at 2:00AM & consisted of 1/2 a bowel of soup. Pt still having abdominal pain, re-ordered pain medication.  5:13 PM Gen Surgery to see in CDU and likely admit for surgery. The patient appears reasonably stabilized for admission considering  the current resources, flow, and capabilities available in the ED at this time, and I doubt any other [facility] requiring further screening and/or treatment in the ED prior to admission.     Jaci Carrel, [place] 03/12/12 1722

## 2012-03-12 NOTE — Progress Notes (Signed)
  Subjective:    Patient ID: Calvin [name], male    DOB: 1946/03/14, 66 y.o.   [name]: [id number]  HPI Here for the onset yesterday of sharp, severe pains in the RLQ of the abdomen and fevers. He has taken several Oxycodone tablets he had at home, and this has masked the pain a little this morning. He had a bowl of soup at 2:00 am today and then a cup of coffee several hours ago. His appetite is down a bit. No nausea or vomiting. He had a normal BM yesterday. No urinary symptoms.    Review of Systems  Constitutional: Positive for fever.  Respiratory: Negative.   Cardiovascular: Negative.   Gastrointestinal: Positive for abdominal pain. Negative for nausea, vomiting, diarrhea, constipation, blood in stool, abdominal distention and rectal pain.  Genitourinary: Positive for flank pain. Negative for dysuria, urgency, frequency, hematuria and difficulty urinating.       Objective:   Physical Exam  Constitutional:       In pain, appears ill but alert   Cardiovascular: Normal rate, regular rhythm, normal heart sounds and intact distal pulses.   Pulmonary/Chest: Effort normal and breath sounds normal.  Abdominal: Bowel sounds are normal. He exhibits no distension and no mass.       He has involuntary guarding. He is quite tender in the RLQ with positive rebound tenderness  Genitourinary: Rectum normal and prostate normal.       No tenderness           Assessment & Plan:  He has RLQ pain with fever. This is most likely acute appendicitis, less likely a ureteral stone. He will drive himself to [facility] ER immediately for further evaluation.

## 2012-03-12 NOTE — ED Notes (Signed)
Report received from [place], [place].  Pt in  CT at this time.

## 2012-03-13 ENCOUNTER — Encounter (HOSPITAL_COMMUNITY): Payer: Self-pay | Admitting: Surgery

## 2012-03-13 MED ORDER — AMOXICILLIN-POT CLAVULANATE 875-125 MG PO TABS: 1.0000 | ORAL_TABLET | Freq: Two times a day (BID) | ORAL | Status: DC

## 2012-03-13 MED ORDER — HYDROCODONE-ACETAMINOPHEN 5-325 MG PO TABS: 1.0000 | ORAL_TABLET | ORAL | Status: DC | PRN

## 2012-03-13 NOTE — Discharge Summary (Signed)
  Patient ID: Calvin [name] [name]: [id number] DOB/AGE: 22-Jan-1946 34 y.o.  Admit date: 03/12/2012 Discharge date: 03/13/2012  Procedures: lap appy  Consults: None  Reason for Admission: This is a 66 yo male who presented to [facility] with c/o RLQ abdominal pain.  He was found to have acute appendicitis and admitted.  Admission Diagnoses:  1. Acute appendicitis 2. HTN  Hospital Course: the patient was admitted and taken to the OR for a lap appy.  He tolerated the procedure well.  He had a normal post op course and was tolerating a regular diet and oral pain medications.  He was felt stable for dc home on POD 1.  PE: Abd: soft, minimally tender, ND, +BS, incisions c/d/i  Discharge Diagnoses:  1. Acute appendicitis 2. S/p lap appy 3. HTN  Discharge Medications:   Medication List     As of 03/13/2012  9:22 AM    TAKE these medications         amLODipine 5 MG tablet   Commonly known as: NORVASC   Take 5 mg by mouth daily.      amLODipine 5 MG tablet   Commonly known as: NORVASC   Take 1 tablet (5 mg total) by mouth daily.      amoxicillin-clavulanate 875-125 MG per tablet   Commonly known as: AUGMENTIN   Take 1 tablet by mouth 2 (two) times daily.      aspirin 81 MG tablet   Take 81 mg by mouth daily.      atorvastatin 20 MG tablet   Commonly known as: LIPITOR   Take 1 tablet (20 mg total) by mouth daily.      dutasteride 0.5 MG capsule   Commonly known as: AVODART   Take 0.5 mg by mouth daily.      HYDROcodone-acetaminophen 5-325 MG per tablet   Commonly known as: NORCO/VICODIN   Take 1-2 tablets by mouth every 4 (four) hours as needed.      MELATONIN PO   Take 1 capsule by mouth at bedtime.      sildenafil 100 MG tablet   Commonly known as: VIAGRA   Take 1 tablet (100 mg total) by mouth as needed for erectile dysfunction.      Tamsulosin HCl 0.4 MG Caps   Commonly known as: FLOMAX   Take 0.4 mg by mouth daily.      valsartan-hydrochlorothiazide 320-25 MG  per tablet   Commonly known as: DIOVAN-HCT   Take 1 tablet by mouth daily.        Discharge Instructions:     Follow-up Information    Follow up with [facility]. On 04/01/2012. (2:15pm, arrive at 1:45pm)    Contact information:   [address] [place], [place] [address] [phone]         Signed: Letha Cape 03/13/2012, 9:22 AM

## 2012-03-13 NOTE — ED Provider Notes (Signed)
Medical screening examination/treatment/procedure(s) were performed by non-physician practitioner and as supervising physician I was immediately available for consultation/collaboration.   Richardean Canal, MD 03/13/12 1500

## 2012-03-13 NOTE — Progress Notes (Signed)
Patient and visitor given discharge instructions and reviewed with them. Patient discharged home with significant other.

## 2012-03-13 NOTE — Progress Notes (Signed)
Pt able to void 150 cc only,scanned bladder which showed only 60 cc,pt requested to give him more time,will continue to monitor.

## 2012-03-17 ENCOUNTER — Other Ambulatory Visit: Payer: Self-pay | Admitting: Family Medicine

## 2012-03-25 ENCOUNTER — Other Ambulatory Visit: Payer: Self-pay | Admitting: Family Medicine

## 2012-04-01 ENCOUNTER — Encounter (INDEPENDENT_AMBULATORY_CARE_PROVIDER_SITE_OTHER): Payer: BC Managed Care – PPO

## 2012-04-07 ENCOUNTER — Other Ambulatory Visit: Payer: Self-pay | Admitting: Family Medicine

## 2012-05-04 ENCOUNTER — Other Ambulatory Visit: Payer: Self-pay | Admitting: Family Medicine

## 2012-05-06 ENCOUNTER — Telehealth: Payer: Self-pay | Admitting: Family Medicine

## 2012-05-06 MED ORDER — TAMSULOSIN HCL 0.4 MG PO CAPS: 0.4000 mg | ORAL_CAPSULE | Freq: Every day | ORAL | Status: DC

## 2012-05-06 NOTE — Telephone Encounter (Signed)
Refill request for Tamsulosin 0.4 mg and I did send e-scribe.

## 2012-06-20 ENCOUNTER — Ambulatory Visit (INDEPENDENT_AMBULATORY_CARE_PROVIDER_SITE_OTHER): Payer: Medicare Other | Admitting: Family Medicine

## 2012-06-20 ENCOUNTER — Encounter: Payer: Self-pay | Admitting: Family Medicine

## 2012-06-20 VITALS — BP 100/70 | HR 103 | Temp 97.8°F | Wt 219.0 lb

## 2012-06-20 DIAGNOSIS — J398 Other specified diseases of upper respiratory tract: Secondary | ICD-10-CM

## 2012-06-20 DIAGNOSIS — J399 Disease of upper respiratory tract, unspecified: Secondary | ICD-10-CM

## 2012-06-20 DIAGNOSIS — R062 Wheezing: Secondary | ICD-10-CM

## 2012-06-20 MED ORDER — GUAIFENESIN-CODEINE 100-10 MG/5ML PO SYRP: 5.0000 mL | ORAL_SOLUTION | Freq: Three times a day (TID) | ORAL | Status: DC | PRN

## 2012-06-20 MED ORDER — AZITHROMYCIN 250 MG PO TABS: ORAL_TABLET | ORAL | Status: DC

## 2012-06-20 NOTE — Patient Instructions (Addendum)
INSTRUCTIONS FOR UPPER RESPIRATORY INFECTION:  -plenty of rest and fluids  -please start antibiotic if worsening or still sick next week -As we discussed, we have prescribed new medications for you at this appointment (AZITHROMYCIN, CHERATUSSIN COUGH MEDICINE). We discussed the common and serious potential adverse effects of this medication and you can review these and more with the pharmacist when you pick up your medication.  Please follow the instructions for use carefully and notify us immediately if you have any problems taking this medication.  -stop using the medicated nasal spray  -nasal saline wash 2-3 times daily (use prepackaged nasal saline or bottled/distilled water if making your own)   -can use tylenol or ibuprofen as directed for aches and sorethroat  -in the winter time, using a humidifier at night is helpful (please follow cleaning instructions)  -if you are taking a cough medication - use only as directed, may also try a teaspoon of honey to coat the throat and throat lozenges  -for sore throat, salt water gargles can help  -follow up if you have fevers, facial pain, tooth pain, difficulty breathing or are worsening or not getting better in 5-7 days

## 2012-06-20 NOTE — Progress Notes (Signed)
Chief Complaint  Patient presents with  . Cough    stopped up, sore throat, cant breathe during the night x 2 weeks     HPI: -started: 2 week -symptoms:nasal congestion, sore throat, cough, drainage, sinus congestion - when coughing feels SOB -denies:fever, SOB, NVD, tooth pain, strep or mono exposure, body aches -has tried: nyquil, nose spray -sick contacts: yes - flu -Hx of: denies hx of chronic lung disease  ROS: See pertinent positives and negatives per HPI.  Past Medical History  Diagnosis Date  . Headache   . Hypertension   . ED (erectile dysfunction)   . Nephrolithiasis     hx of had lithotripsy in 1-10.  Marland Kitchen Benign prostatic hypertrophy   . Hypogonadism male     Family History  Problem Relation Age of Onset  . Hypertension Other   . Stroke Other   . Heart disease Other     rheumatic   . Coronary artery disease Other     History   Social History  . Marital Status: Widowed    Spouse Name: N/A    Number of Children: N/A  . Years of Education: N/A   Social History Main Topics  . Smoking status: Never Smoker   . Smokeless tobacco: Never Used  . Alcohol Use: 3.5 oz/week    7 drink(s) per week  . Drug Use: No  . Sexually Active: None   Other Topics Concern  . None   Social History Narrative  . None    Current outpatient prescriptions:amLODipine (NORVASC) 5 MG tablet, Take 5 mg by mouth daily., Disp: , Rfl: ;  amLODipine (NORVASC) 5 MG tablet, TAKE 1 TABLET BY MOUTH DAILY, Disp: 30 tablet, Rfl: 11;  amoxicillin-clavulanate (AUGMENTIN) 875-125 MG per tablet, Take 1 tablet by mouth 2 (two) times daily., Disp: 10 tablet, Rfl: 0;  aspirin 81 MG tablet, Take 81 mg by mouth daily.  , Disp: , Rfl:  atorvastatin (LIPITOR) 20 MG tablet, TAKE ONE TABLET BY MOUTH DAILY, Disp: 90 tablet, Rfl: 0;  AVODART 0.5 MG capsule, TAKE 1 CAPSULE BY MOUTH DAILY, Disp: 30 capsule, Rfl: 11;  dutasteride (AVODART) 0.5 MG capsule, Take 0.5 mg by mouth daily., Disp: , Rfl: ;  MELATONIN  PO, Take 1 capsule by mouth at bedtime., Disp: , Rfl: ;  sildenafil (VIAGRA) 100 MG tablet, Take 100 mg by mouth as needed., Disp: , Rfl:  Tamsulosin HCl (FLOMAX) 0.4 MG CAPS, Take 0.4 mg by mouth daily., Disp: , Rfl: ;  Tamsulosin HCl (FLOMAX) 0.4 MG CAPS, Take 1 capsule (0.4 mg total) by mouth daily., Disp: 30 capsule, Rfl: 10;  valsartan-hydrochlorothiazide (DIOVAN-HCT) 320-25 MG per tablet, Take 1 tablet by mouth daily., Disp: , Rfl: ;  valsartan-hydrochlorothiazide (DIOVAN-HCT) 320-25 MG per tablet, TAKE 1 TABLET BY MOUTH DAILY, Disp: 30 tablet, Rfl: 11 azithromycin (ZITHROMAX) 250 MG tablet, 2 tabs for the first day then 1 tab daily for 4 days, Disp: 6 tablet, Rfl: 0;  guaiFENesin-codeine (ROBITUSSIN AC) 100-10 MG/5ML syrup, Take 5 mLs by mouth 3 (three) times daily as needed for cough., Disp: 120 mL, Rfl: 0  EXAM:  Filed Vitals:   06/20/12 1306  BP: 100/70  Pulse: 103  Temp: 97.8 F (36.6 C)    There is no height on file to calculate BMI.  GENERAL: vitals reviewed and listed above, alert, oriented, appears well hydrated and in no acute distress  HEENT: atraumatic, conjunttiva clear, no obvious abnormalities on inspection of external nose and ears, normal appearance of  ear canals and TMs, clear nasal congestion, mild post oropharyngeal erythema with PND, no tonsillar edema or exudate, no sinus TTP  NECK: no obvious masses on inspection  LUNGS: clear to auscultation bilaterally, no wheezes, rales or rhonchi, good air movement  CV: HRRR, no peripheral edema  MS: moves all extremities without noticeable abnormality  PSYCH: pleasant and cooperative, no obvious depression or anxiety  ASSESSMENT AND PLAN:  Discussed the following assessment and plan:  1. Upper respiratory disease  guaiFENesin-codeine (ROBITUSSIN AC) 100-10 MG/5ML syrup, azithromycin (ZITHROMAX) 250 MG tablet, DISCONTINUED: azithromycin (ZITHROMAX) 250 MG tablet  2. Wheeze     -likely viral, but given ongoing  symptoms discussed potential for bacterial infection and risks/benefits of abx. Pt would like to hold of on abx for a few more days, but have on hand. Rx given for abx and cough medication - risks and proper use discussed. -no wheezing on exam, possible mild RAD associated with URI, return precautions -Patient advised to return or notify a doctor immediately if symptoms worsen or persist or new concerns arise.  Patient Instructions  INSTRUCTIONS FOR UPPER RESPIRATORY INFECTION:  -plenty of rest and fluids  -please start antibiotic if worsening or still sick next week -As we discussed, we have prescribed new medications for you at this appointment (AZITHROMYCIN, CHERATUSSIN COUGH MEDICINE). We discussed the common and serious potential adverse effects of this medication and you can review these and more with the pharmacist when you pick up your medication.  Please follow the instructions for use carefully and notify us immediately if you have any problems taking this medication.  -stop using the medicated nasal spray  -nasal saline wash 2-3 times daily (use prepackaged nasal saline or bottled/distilled water if making your own)   -can use tylenol or ibuprofen as directed for aches and sorethroat  -in the winter time, using a humidifier at night is helpful (please follow cleaning instructions)  -if you are taking a cough medication - use only as directed, may also try a teaspoon of honey to coat the throat and throat lozenges  -for sore throat, salt water gargles can help  -follow up if you have fevers, facial pain, tooth pain, difficulty breathing or are worsening or not getting better in 5-7 days      [place] Deleo R.

## 2012-07-03 ENCOUNTER — Other Ambulatory Visit: Payer: Self-pay | Admitting: Family Medicine

## 2012-10-28 ENCOUNTER — Other Ambulatory Visit: Payer: Self-pay | Admitting: Family Medicine

## 2012-10-31 NOTE — Telephone Encounter (Signed)
Can we refill this? 

## 2012-12-09 ENCOUNTER — Encounter: Payer: Self-pay | Admitting: Family Medicine

## 2012-12-09 ENCOUNTER — Ambulatory Visit (INDEPENDENT_AMBULATORY_CARE_PROVIDER_SITE_OTHER): Payer: Medicare Other | Admitting: Family Medicine

## 2012-12-09 VITALS — BP 114/74 | HR 83 | Temp 98.3°F | Ht 71.5 in | Wt 228.0 lb

## 2012-12-09 DIAGNOSIS — R600 Localized edema: Secondary | ICD-10-CM

## 2012-12-09 DIAGNOSIS — I10 Essential (primary) hypertension: Secondary | ICD-10-CM

## 2012-12-09 DIAGNOSIS — L989 Disorder of the skin and subcutaneous tissue, unspecified: Secondary | ICD-10-CM

## 2012-12-09 DIAGNOSIS — R609 Edema, unspecified: Secondary | ICD-10-CM

## 2012-12-09 DIAGNOSIS — Z0000 Encounter for general adult medical examination without abnormal findings: Secondary | ICD-10-CM

## 2012-12-09 LAB — POCT URINALYSIS DIPSTICK
Glucose, UA: NEGATIVE
Nitrite, UA: NEGATIVE
Protein, UA: NEGATIVE
Spec Grav, UA: 1.015
Urobilinogen, UA: 0.2

## 2012-12-09 LAB — HEPATIC FUNCTION PANEL
ALT: 179 U/L — ABNORMAL HIGH (ref 0–53)
Albumin: 4.6 g/dL (ref 3.5–5.2)
Bilirubin, Direct: 0.2 mg/dL (ref 0.0–0.3)
Total Protein: 7.5 g/dL (ref 6.0–8.3)

## 2012-12-09 LAB — CBC WITH DIFFERENTIAL/PLATELET
Basophils Absolute: 0 10*3/uL (ref 0.0–0.1)
Basophils Relative: 0.7 % (ref 0.0–3.0)
Eosinophils Relative: 4.6 % (ref 0.0–5.0)
Lymphocytes Relative: 33.2 % (ref 12.0–46.0)
Lymphs Abs: 1.7 10*3/uL (ref 0.7–4.0)
MCHC: 34.1 g/dL (ref 30.0–36.0)
MCV: 103 fl — ABNORMAL HIGH (ref 78.0–100.0)
Monocytes Relative: 11.3 % (ref 3.0–12.0)
Neutro Abs: 2.6 10*3/uL (ref 1.4–7.7)
Platelets: 227 10*3/uL (ref 150.0–400.0)
RDW: 13.3 % (ref 11.5–14.6)

## 2012-12-09 LAB — BASIC METABOLIC PANEL
BUN: 14 mg/dL (ref 6–23)
CO2: 27 mEq/L (ref 19–32)
Calcium: 9.9 mg/dL (ref 8.4–10.5)
Creatinine, Ser: 0.9 mg/dL (ref 0.4–1.5)
GFR: 85.49 mL/min (ref >60.00)
Glucose, Bld: 103 mg/dL — ABNORMAL HIGH (ref 70–99)

## 2012-12-09 LAB — LIPID PANEL
Cholesterol: 191 mg/dL (ref 0–200)
HDL: 78.3 mg/dL (ref >39.00)
Triglycerides: 134 mg/dL (ref 0.0–149.0)
VLDL: 26.8 mg/dL (ref 0.0–40.0)

## 2012-12-09 LAB — TSH: TSH: 2.19 u[IU]/mL (ref 0.35–5.50)
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
## 2012-12-10 ENCOUNTER — Encounter (INDEPENDENT_AMBULATORY_CARE_PROVIDER_SITE_OTHER): Payer: Medicare Other

## 2012-12-10 DIAGNOSIS — R600 Localized edema: Secondary | ICD-10-CM

## 2012-12-10 DIAGNOSIS — R609 Edema, unspecified: Secondary | ICD-10-CM

## 2012-12-12 NOTE — Progress Notes (Signed)
Quick Note:  I spoke with pt and went over the results. ______

## 2012-12-15 NOTE — Progress Notes (Signed)
Quick Note:  I spoke with pt ______ 

## 2013-03-09 ENCOUNTER — Ambulatory Visit (INDEPENDENT_AMBULATORY_CARE_PROVIDER_SITE_OTHER): Payer: Medicare Other | Admitting: Family Medicine

## 2013-03-09 ENCOUNTER — Encounter: Payer: Self-pay | Admitting: Family Medicine

## 2013-03-09 VITALS — BP 114/78 | Temp 98.8°F | Wt 222.0 lb

## 2013-03-09 DIAGNOSIS — K5289 Other specified noninfective gastroenteritis and colitis: Secondary | ICD-10-CM

## 2013-03-09 DIAGNOSIS — K529 Noninfective gastroenteritis and colitis, unspecified: Secondary | ICD-10-CM

## 2013-03-09 MED ORDER — CIPROFLOXACIN HCL 500 MG PO TABS: 500.0000 mg | ORAL_TABLET | Freq: Two times a day (BID) | ORAL | Status: DC

## 2013-03-09 NOTE — Progress Notes (Signed)
  Subjective:    Patient ID: Calvin [name], male    DOB: 1945/06/29, 67 y.o.   [name]: [id number]  HPI Here for 4 days of mild abdominal cramping, diarrhea, and nausea. He has vomited twice. He is drinking Pedialyte and using Pepto-Bismol. No fever. He leaves on a business trip to [place] in 2 days.    Review of Systems  Constitutional: Negative.   Gastrointestinal: Positive for nausea, vomiting, abdominal pain and diarrhea. Negative for constipation, blood in stool, abdominal distention and anal bleeding.       Objective:   Physical Exam  Constitutional: He appears well-developed and well-nourished. No distress.  Abdominal: Soft. Bowel sounds are normal. He exhibits no distension and no mass. There is no tenderness. There is no rebound and no guarding.          Assessment & Plan:  Start on Cipro. Use Imodium AD prn

## 2013-03-26 ENCOUNTER — Other Ambulatory Visit: Payer: Self-pay | Admitting: Family Medicine

## 2013-04-21 ENCOUNTER — Other Ambulatory Visit: Payer: Self-pay | Admitting: Family Medicine

## 2013-04-28 ENCOUNTER — Encounter: Payer: Self-pay | Admitting: Family Medicine

## 2013-04-28 ENCOUNTER — Ambulatory Visit (INDEPENDENT_AMBULATORY_CARE_PROVIDER_SITE_OTHER): Payer: Medicare Other | Admitting: Family Medicine

## 2013-04-28 VITALS — BP 120/80 | HR 100 | Temp 98.6°F | Wt 226.0 lb

## 2013-04-28 DIAGNOSIS — J209 Acute bronchitis, unspecified: Secondary | ICD-10-CM

## 2013-04-28 MED ORDER — AZITHROMYCIN 250 MG PO TABS: ORAL_TABLET | ORAL | Status: DC

## 2013-04-28 MED ORDER — HYDROCODONE-HOMATROPINE 5-1.5 MG/5ML PO SYRP: 5.0000 mL | ORAL_SOLUTION | ORAL | Status: DC | PRN

## 2013-04-28 NOTE — Progress Notes (Signed)
  Subjective:    Patient ID: Calvin [name], male    DOB: 1945/11/09, 67 y.o.   [name]: [id number]  HPI Here for 10 days of chest tightness and coughing up green sputum. No fever.    Review of Systems  Constitutional: Negative.   HENT: Negative.   Eyes: Negative.   Respiratory: Positive for cough and chest tightness.        Objective:   Physical Exam  Constitutional: He appears well-developed and well-nourished.  HENT:  Right Ear: External ear normal.  Left Ear: External ear normal.  Nose: Nose normal.  Mouth/Throat: Oropharynx is clear and moist.  Eyes: Conjunctivae are normal.  Pulmonary/Chest: Effort normal. No respiratory distress. He has no wheezes. He has no rales.  Scattered rhonchi   Lymphadenopathy:    He has no cervical adenopathy.          Assessment & Plan:  Add Mucinex

## 2013-04-28 NOTE — Progress Notes (Signed)
Pre visit review using our clinic review tool, if applicable. No additional management support is needed unless otherwise documented below in the visit note. 

## 2013-06-26 ENCOUNTER — Ambulatory Visit (INDEPENDENT_AMBULATORY_CARE_PROVIDER_SITE_OTHER): Payer: Medicare Other | Admitting: Family Medicine

## 2013-06-26 DIAGNOSIS — Z23 Encounter for immunization: Secondary | ICD-10-CM

## 2013-08-28 ENCOUNTER — Other Ambulatory Visit: Payer: Self-pay | Admitting: Family Medicine

## 2013-10-29 ENCOUNTER — Encounter: Payer: Self-pay | Admitting: Family Medicine

## 2013-10-29 ENCOUNTER — Ambulatory Visit (INDEPENDENT_AMBULATORY_CARE_PROVIDER_SITE_OTHER): Payer: Medicare Other | Admitting: Family Medicine

## 2013-10-29 VITALS — BP 138/99 | HR 90 | Temp 98.9°F | Ht 71.5 in | Wt 222.0 lb

## 2013-10-29 DIAGNOSIS — G4700 Insomnia, unspecified: Secondary | ICD-10-CM

## 2013-10-29 MED ORDER — TADALAFIL 5 MG PO TABS: 5.0000 mg | ORAL_TABLET | Freq: Every day | ORAL | Status: DC | PRN

## 2013-10-29 MED ORDER — TEMAZEPAM 30 MG PO CAPS: 30.0000 mg | ORAL_CAPSULE | Freq: Every evening | ORAL | Status: DC | PRN

## 2013-10-29 NOTE — Progress Notes (Signed)
Pre visit review using our clinic review tool, if applicable. No additional management support is needed unless otherwise documented below in the visit note. 

## 2013-10-29 NOTE — Progress Notes (Signed)
   Subjective:    Patient ID: Calvin [name], male    DOB: August 17, 1945, 68 y.o.   [name]: [id number]  HPI Here to discuss chronic sleep difficulties. It takes him about 2 hours to relax enough every night to go to sleep, and he often wakes up multiple times. He says he thinks about work and other things all night. He often gives up and watches TV. He admits to stress but denies depression sx.    Review of Systems  Constitutional: Negative.   Respiratory: Negative.   Cardiovascular: Negative.   Neurological: Negative.   Psychiatric/Behavioral: Positive for sleep disturbance. Negative for hallucinations, behavioral problems, confusion, dysphoric mood, decreased concentration and agitation. The patient is nervous/anxious.        Objective:   Physical Exam  Constitutional: He is oriented to person, place, and time. He appears well-developed and well-nourished.  Neurological: He is alert and oriented to person, place, and time.  Psychiatric: He has a normal mood and affect. His behavior is normal. Thought content normal.          Assessment & Plan:  Try Temazepam. He has also been drinking a lot of alcohol at night, so I advised him to at least cut way back on alcohol use or stop it altogether.

## 2013-10-30 DIAGNOSIS — G4700 Insomnia, unspecified: Secondary | ICD-10-CM | POA: Insufficient documentation

## 2014-01-08 ENCOUNTER — Encounter: Payer: Self-pay | Admitting: Family Medicine

## 2014-01-08 ENCOUNTER — Ambulatory Visit (INDEPENDENT_AMBULATORY_CARE_PROVIDER_SITE_OTHER): Payer: Medicare Other | Admitting: Family Medicine

## 2014-01-08 VITALS — BP 138/80 | HR 101 | Temp 99.0°F | Ht 71.5 in | Wt 224.0 lb

## 2014-01-08 DIAGNOSIS — R1011 Right upper quadrant pain: Secondary | ICD-10-CM

## 2014-01-08 MED ORDER — METHYLPREDNISOLONE 4 MG PO KIT: PACK | ORAL | Status: AC

## 2014-01-08 NOTE — Progress Notes (Signed)
Pre visit review using our clinic review tool, if applicable. No additional management support is needed unless otherwise documented below in the visit note. 

## 2014-01-08 NOTE — Progress Notes (Signed)
   Subjective:    Patient ID: Calvin [name], male    DOB: 1946-06-03, 68 y.o.   [name]: [id number]  HPI Here for 3 days of intermittent sharp superficial pains in the right upper abdomen. This is not affected by eating. No fever or nausea. No urinary or bowel sx.    Review of Systems  Constitutional: Negative.   Gastrointestinal: Positive for abdominal pain. Negative for nausea, vomiting, diarrhea, constipation, blood in stool and abdominal distention.  Genitourinary: Negative.        Objective:   Physical Exam  Constitutional: He appears well-developed and well-nourished.  Cardiovascular: Normal rate, regular rhythm, normal heart sounds and intact distal pulses.   Pulmonary/Chest: Effort normal and breath sounds normal.  Abdominal: Soft. Bowel sounds are normal. He exhibits no distension and no mass. There is no tenderness. There is no rebound and no guarding.  Skin: No rash noted. No erythema.          Assessment & Plan:  His exam is normal. This is most consistent with an inflamed nerve of uncertain etiology. Try a Medrol dose pack to calm it down. Recheck prn

## 2014-02-26 ENCOUNTER — Telehealth: Payer: Self-pay | Admitting: Family Medicine

## 2014-02-26 MED ORDER — DUTASTERIDE 0.5 MG PO CAPS: ORAL_CAPSULE | ORAL | Status: DC

## 2014-02-26 NOTE — Telephone Encounter (Signed)
Refill request for Avodart 0.5 mg and send to [facility]. I did send script e-scribe.

## 2014-03-10 ENCOUNTER — Ambulatory Visit (INDEPENDENT_AMBULATORY_CARE_PROVIDER_SITE_OTHER): Payer: Medicare Other | Admitting: Family Medicine

## 2014-03-10 ENCOUNTER — Encounter: Payer: Self-pay | Admitting: Family Medicine

## 2014-03-10 VITALS — BP 137/88 | HR 92 | Temp 98.8°F | Ht 71.5 in | Wt 218.0 lb

## 2014-03-10 DIAGNOSIS — K625 Hemorrhage of anus and rectum: Secondary | ICD-10-CM

## 2014-03-10 MED ORDER — TADALAFIL 10 MG PO TABS
10.0000 mg | ORAL_TABLET | Freq: Every day | ORAL | Status: DC | PRN
Start: 2014-03-10 — End: 2014-09-01

## 2014-03-10 NOTE — Progress Notes (Signed)
Pre visit review using our clinic review tool, if applicable. No additional management support is needed unless otherwise documented below in the visit note. 

## 2014-03-10 NOTE — Progress Notes (Signed)
   Subjective:    Patient ID: Calvin [name], male    DOB: 1945-07-16, 68 y.o.   [name]: [id number]  HPI Here for 3 days of bright red blood mixed with the stool every time he passes a BM. The movements are soft and not hard. No abdominal pain.    Review of Systems  Constitutional: Negative.   Gastrointestinal: Positive for blood in stool. Negative for nausea, vomiting, abdominal pain, diarrhea, constipation, abdominal distention, anal bleeding and rectal pain.       Objective:   Physical Exam  Constitutional: He appears well-developed and well-nourished.  Cardiovascular: Normal rate, regular rhythm, normal heart sounds and intact distal pulses.   Abdominal: Soft. Bowel sounds are normal. He exhibits no distension and no mass. There is no tenderness. There is no rebound and no guarding.  Genitourinary: Rectum normal.  No bleeding seen, no external hemorrhoids           Assessment & Plan:  He is probably bleeding again from internal hemorrhoids. Refer back to Dr. Johney Maine

## 2014-05-26 ENCOUNTER — Encounter: Payer: Self-pay | Admitting: Family

## 2014-05-26 ENCOUNTER — Other Ambulatory Visit: Payer: Self-pay | Admitting: Family Medicine

## 2014-05-26 ENCOUNTER — Ambulatory Visit (INDEPENDENT_AMBULATORY_CARE_PROVIDER_SITE_OTHER): Payer: Medicare Other | Admitting: Family

## 2014-05-26 VITALS — BP 144/60 | HR 96 | Temp 97.8°F | Ht 71.5 in | Wt 218.0 lb

## 2014-05-26 DIAGNOSIS — R05 Cough: Secondary | ICD-10-CM

## 2014-05-26 DIAGNOSIS — J209 Acute bronchitis, unspecified: Secondary | ICD-10-CM

## 2014-05-26 DIAGNOSIS — R059 Cough, unspecified: Secondary | ICD-10-CM

## 2014-05-26 MED ORDER — HYDROCODONE-HOMATROPINE 5-1.5 MG/5ML PO SYRP: 5.0000 mL | ORAL_SOLUTION | ORAL | Status: DC | PRN

## 2014-05-26 MED ORDER — METHYLPREDNISOLONE 4 MG PO KIT: PACK | ORAL | Status: AC

## 2014-05-26 NOTE — Patient Instructions (Signed)

## 2014-05-26 NOTE — Progress Notes (Signed)
Pre visit review using our clinic review tool, if applicable. No additional management support is needed unless otherwise documented below in the visit note. 

## 2014-05-26 NOTE — Progress Notes (Signed)
Subjective:    Patient ID: Calvin [name], male    DOB: 08/07/1945, 68 y.o.   [name]: [id number]  HPI 68 year old white male, nonsmoker, in with c/o cough x 1 week without much relief. Better with Robitussin with codeine. Reports having chest congestion and wheezing. No fever or chills.    Review of Systems  Constitutional: Negative.   HENT: Positive for congestion, postnasal drip, rhinorrhea and sore throat.   Respiratory: Positive for cough and wheezing.   Cardiovascular: Negative.   Endocrine: Negative.   Genitourinary: Negative.   Musculoskeletal: Negative.   Skin: Negative.   Allergic/Immunologic: Negative.   Neurological: Negative.    Past Medical History  Diagnosis Date  . Headache(784.0)   . Hypertension   . ED (erectile dysfunction)   . Nephrolithiasis     hx of had lithotripsy in 1-10.  Marland Kitchen Benign prostatic hypertrophy   . Hypogonadism male     History   Social History  . Marital Status: Widowed    Spouse Name: N/A    Number of Children: N/A  . Years of Education: N/A   Occupational History  . Not on file.   Social History Main Topics  . Smoking status: Never Smoker   . Smokeless tobacco: Never Used  . Alcohol Use: 3.5 oz/week    7 drink(s) per week  . Drug Use: No  . Sexual Activity: Not on file   Other Topics Concern  . Not on file   Social History Narrative    Past Surgical History  Procedure Laterality Date  . Tonsillectomy    . Colonoscopy  03-28-10    per Dr. Fuller Plan, hyperplastic polyps, repeat in 10 yrs   . Staple hemorrhoidectomy  04-14-10    per Dr. Johney Maine   . Laparoscopic appendectomy  03/12/2012    Procedure: APPENDECTOMY LAPAROSCOPIC;  Surgeon: Harl Bowie, MD;  Location: [facility] OR;  Service: General;  Laterality: N/A;    Family History  Problem Relation Age of Onset  . Hypertension Other   . Stroke Other   . Heart disease Other     rheumatic   . Coronary artery disease Other     Allergies  Allergen Reactions  .  Sulfonamide Derivatives Other (See Comments)    Reaction unknown    Current Outpatient Prescriptions on File Prior to Visit  Medication Sig Dispense Refill  . amLODipine (NORVASC) 5 MG tablet TAKE 1 TABLET BY MOUTH EVERY DAY 90 tablet 0  . aspirin 81 MG tablet Take 81 mg by mouth daily.      Marland Kitchen dutasteride (AVODART) 0.5 MG capsule TAKE ONE CAPSULE BY MOUTH EVERY DAY 30 capsule 3  . orlistat (ALLI) 60 MG capsule Take 60 mg by mouth 3 (three) times daily with meals.    . tadalafil (CIALIS) 10 MG tablet Take 1 tablet (10 mg total) by mouth daily as needed for erectile dysfunction. 30 tablet 5  . tadalafil (CIALIS) 5 MG tablet Take 1 tablet (5 mg total) by mouth daily as needed for erectile dysfunction. 90 tablet 3  . tamsulosin (FLOMAX) 0.4 MG CAPS capsule TAKE ONE CAPSULE BY MOUTH EVERY DAY 30 capsule 3  . temazepam (RESTORIL) 30 MG capsule Take 1 capsule (30 mg total) by mouth at bedtime as needed for sleep. 30 capsule 5  . valsartan-hydrochlorothiazide (DIOVAN-HCT) 320-25 MG per tablet TAKE 1 TABLET BY MOUTH EVERY DAY 90 tablet 0   No current facility-administered medications on file prior to visit.  BP 144/60 mmHg  Pulse 96  Temp(Src) 97.8 F (36.6 C) (Oral)  Ht 5' 11.5" (1.816 m)  Wt 218 lb (98.884 kg)  BMI 29.98 kg/m2chart    Objective:   Physical Exam  Constitutional: He is oriented to person, place, and time. He appears well-developed and well-nourished.  HENT:  Right Ear: External ear normal.  Left Ear: External ear normal.  Nose: Nose normal.  Mouth/Throat: Oropharynx is clear and moist.  Neck: Normal range of motion. Neck supple.  Cardiovascular: Normal rate, regular rhythm and normal heart sounds.   Pulmonary/Chest: Effort normal. He has wheezes.  Musculoskeletal: Normal range of motion.  Neurological: He is alert and oriented to person, place, and time.  Skin: Skin is warm and dry.  Psychiatric: He has a normal mood and affect.          Assessment & Plan:    Goro was seen today for cough.  Diagnoses and associated orders for this visit:  Acute bronchitis, unspecified organism  Cough  Other Orders - methylPREDNISolone (MEDROL DOSEPAK) 4 MG tablet; follow package directions - HYDROcodone-homatropine (HYDROMET) 5-1.5 MG/5ML syrup; Take 5 mLs by mouth every 4 (four) hours as needed for cough.   Call the office with any questions or concerns. Recheck as scheduled and as needed.

## 2014-05-27 NOTE — Telephone Encounter (Signed)
Refill for 6 months. 

## 2014-05-28 NOTE — Telephone Encounter (Signed)
Called to the pharmacy and left on machine. 

## 2014-06-08 ENCOUNTER — Ambulatory Visit: Payer: Medicare Other | Admitting: Family Medicine

## 2014-07-20 ENCOUNTER — Ambulatory Visit (INDEPENDENT_AMBULATORY_CARE_PROVIDER_SITE_OTHER): Payer: Medicare Other | Admitting: Family Medicine

## 2014-07-20 ENCOUNTER — Telehealth: Payer: Self-pay | Admitting: *Deleted

## 2014-07-20 DIAGNOSIS — Z23 Encounter for immunization: Secondary | ICD-10-CM

## 2014-07-20 NOTE — Telephone Encounter (Signed)
pts insurance company is no longer going to cover Avodart.  Told pt to call his insurance company and find out what they will cover and let us know.  He is coming in this afternoon for a flu shot and will let [facility] know at that time

## 2014-07-31 ENCOUNTER — Other Ambulatory Visit: Payer: Self-pay | Admitting: Family Medicine

## 2014-08-02 NOTE — Telephone Encounter (Signed)
Looks like pt needs a office visit?

## 2014-08-03 ENCOUNTER — Telehealth: Payer: Self-pay | Admitting: Family Medicine

## 2014-08-03 NOTE — Telephone Encounter (Signed)
PA for valsartan hctz is not on patient's formulary.  Plan covers valsartan, telmisartan hctz or losartan hctz.

## 2014-08-03 NOTE — Telephone Encounter (Signed)
Change to Losartan HCT 100-25 once daily. Call in one year supply

## 2014-08-04 MED ORDER — LOSARTAN POTASSIUM-HCTZ 100-25 MG PO TABS: 1.0000 | ORAL_TABLET | Freq: Every day | ORAL | Status: DC

## 2014-08-04 NOTE — Telephone Encounter (Signed)
I sent script e-scribe and spoke with pt. 

## 2014-08-05 ENCOUNTER — Telehealth: Payer: Self-pay | Admitting: Family Medicine

## 2014-08-05 NOTE — Telephone Encounter (Signed)
done

## 2014-08-13 ENCOUNTER — Other Ambulatory Visit: Payer: Self-pay | Admitting: Family Medicine

## 2014-08-26 ENCOUNTER — Telehealth: Payer: Self-pay | Admitting: Family Medicine

## 2014-08-26 NOTE — Telephone Encounter (Signed)
Pt has concerns about his meds. Pt is on 3 bp meds  amLODipine (NORVASC) 5 MG tablet losartan-hydrochlorothiazide (HYZAAR) 100-25 MG per tablet and valsartan-hydrochlorothiazide (DIOVAN-HCT) 320-25 MG per tablet   And now is has no refills dutasteride (AVODART) 0.5 MG capsule  (pt had been on this 20 yrs)  Advised pt to make appt, but pt wants a call back first.

## 2014-08-27 ENCOUNTER — Telehealth: Payer: Self-pay | Admitting: Family Medicine

## 2014-08-27 NOTE — Telephone Encounter (Signed)
I spoke with pt and he needs a CPE and labs.

## 2014-08-27 NOTE — Telephone Encounter (Signed)
Okay, pt is going to schedule a CPE & labs soon.

## 2014-08-27 NOTE — Telephone Encounter (Signed)
PA for Dutasteride was denied. The representative advised they will mail the denial letter to our office and patient but did not give any denial information.  I will call back to see if a different representative will give me the denial information.

## 2014-08-30 NOTE — Telephone Encounter (Signed)
He has a cpx set up for 09-01-14.

## 2014-09-01 ENCOUNTER — Ambulatory Visit (INDEPENDENT_AMBULATORY_CARE_PROVIDER_SITE_OTHER): Payer: Medicare Other | Admitting: Family Medicine

## 2014-09-01 ENCOUNTER — Encounter: Payer: Self-pay | Admitting: Family Medicine

## 2014-09-01 VITALS — BP 154/104 | HR 80 | Temp 98.9°F | Ht 71.5 in | Wt 214.0 lb

## 2014-09-01 DIAGNOSIS — Z0000 Encounter for general adult medical examination without abnormal findings: Secondary | ICD-10-CM

## 2014-09-01 DIAGNOSIS — I10 Essential (primary) hypertension: Secondary | ICD-10-CM | POA: Diagnosis not present

## 2014-09-01 LAB — HEPATIC FUNCTION PANEL
ALBUMIN: 4.9 g/dL (ref 3.5–5.2)
ALT: 61 U/L — AB (ref 0–53)
AST: 36 U/L (ref 0–37)
Alkaline Phosphatase: 57 U/L (ref 39–117)
BILIRUBIN DIRECT: 0.2 mg/dL (ref 0.0–0.3)
TOTAL PROTEIN: 7.2 g/dL (ref 6.0–8.3)
Total Bilirubin: 0.9 mg/dL (ref 0.2–1.2)

## 2014-09-01 LAB — POCT URINALYSIS DIPSTICK
BILIRUBIN UA: NEGATIVE
Blood, UA: NEGATIVE
Glucose, UA: NEGATIVE
KETONES UA: NEGATIVE
NITRITE UA: NEGATIVE
Spec Grav, UA: 1.01
Urobilinogen, UA: 0.2
pH, UA: 5.5

## 2014-09-01 LAB — BASIC METABOLIC PANEL
BUN: 18 mg/dL (ref 6–23)
CHLORIDE: 101 meq/L (ref 96–112)
CO2: 29 meq/L (ref 19–32)
Calcium: 9.5 mg/dL (ref 8.4–10.5)
Creatinine, Ser: 0.8 mg/dL (ref 0.40–1.50)
GFR: 102.44 mL/min (ref >60.00)
Glucose, Bld: 92 mg/dL (ref 70–99)
Potassium: 4 mEq/L (ref 3.5–5.1)
Sodium: 138 mEq/L (ref 135–145)

## 2014-09-01 LAB — CBC WITH DIFFERENTIAL/PLATELET
Basophils Absolute: 0 10*3/uL (ref 0.0–0.1)
Basophils Relative: 0.6 % (ref 0.0–3.0)
EOS ABS: 0.1 10*3/uL (ref 0.0–0.7)
Eosinophils Relative: 1.5 % (ref 0.0–5.0)
HEMATOCRIT: 42.6 % (ref 39.0–52.0)
HEMOGLOBIN: 14.9 g/dL (ref 13.0–17.0)
Lymphocytes Relative: 31.7 % (ref 12.0–46.0)
Lymphs Abs: 1.7 10*3/uL (ref 0.7–4.0)
MCHC: 34.8 g/dL (ref 30.0–36.0)
MCV: 100.2 fl — AB (ref 78.0–100.0)
MONO ABS: 0.5 10*3/uL (ref 0.1–1.0)
MONOS PCT: 9 % (ref 3.0–12.0)
NEUTROS ABS: 3.1 10*3/uL (ref 1.4–7.7)
Neutrophils Relative %: 57.2 % (ref 43.0–77.0)
PLATELETS: 246 10*3/uL (ref 150.0–400.0)
RBC: 4.26 Mil/uL (ref 4.22–5.81)
RDW: 12.6 % (ref 11.5–15.5)
WBC: 5.4 10*3/uL (ref 4.0–10.5)

## 2014-09-01 LAB — TSH: TSH: 1.62 u[IU]/mL (ref 0.35–4.50)

## 2014-09-01 LAB — LIPID PANEL
CHOL/HDL RATIO: 2
Cholesterol: 190 mg/dL (ref 0–200)
HDL: 83.8 mg/dL (ref >39.00)
LDL CALC: 90 mg/dL (ref 0–99)
NONHDL: 106.2
Triglycerides: 80 mg/dL (ref 0.0–149.0)
VLDL: 16 mg/dL (ref 0.0–40.0)

## 2014-09-01 LAB — PSA: PSA: 1.17 ng/mL (ref 0.10–4.00)

## 2014-09-01 MED ORDER — SILDENAFIL CITRATE 100 MG PO TABS: 100.0000 mg | ORAL_TABLET | ORAL | Status: DC | PRN

## 2014-09-01 MED ORDER — TAMSULOSIN HCL 0.4 MG PO CAPS: 0.4000 mg | ORAL_CAPSULE | Freq: Every day | ORAL | Status: DC

## 2014-09-01 MED ORDER — LOSARTAN POTASSIUM-HCTZ 100-25 MG PO TABS: 1.0000 | ORAL_TABLET | Freq: Every day | ORAL | Status: DC

## 2014-09-01 MED ORDER — AMLODIPINE BESYLATE 5 MG PO TABS: 5.0000 mg | ORAL_TABLET | Freq: Every day | ORAL | Status: DC

## 2014-09-01 NOTE — Progress Notes (Signed)
   Subjective:    Patient ID: Calvin [name], male    DOB: 09/08/1945, 69 y.o.   [name]: [id number]  HPI 69 yr old male for a cpx. He has felt fine and his BP has been stable lately. He did not take any meds this am so his BP is up right now. He wants to switch from Cialis back top Viagra.    Review of Systems  Constitutional: Negative.   HENT: Negative.   Eyes: Negative.   Respiratory: Negative.   Cardiovascular: Negative.   Gastrointestinal: Negative.   Genitourinary: Negative.   Musculoskeletal: Negative.   Skin: Negative.   Neurological: Negative.   Psychiatric/Behavioral: Negative.        Objective:   Physical Exam  Constitutional: He is oriented to person, place, and time. He appears well-developed and well-nourished. No distress.  HENT:  Head: Normocephalic and atraumatic.  Right Ear: External ear normal.  Left Ear: External ear normal.  Nose: Nose normal.  Mouth/Throat: Oropharynx is clear and moist. No oropharyngeal exudate.  Eyes: Conjunctivae and EOM are normal. Pupils are equal, round, and reactive to light. Right eye exhibits no discharge. Left eye exhibits no discharge. No scleral icterus.  Neck: Neck supple. No JVD present. No tracheal deviation present. No thyromegaly present.  Cardiovascular: Normal rate, regular rhythm, normal heart sounds and intact distal pulses.  Exam reveals no gallop and no friction rub.   No murmur heard. EKG normal   Pulmonary/Chest: Effort normal and breath sounds normal. No respiratory distress. He has no wheezes. He has no rales. He exhibits no tenderness.  Abdominal: Soft. Bowel sounds are normal. He exhibits no distension and no mass. There is no tenderness. There is no rebound and no guarding.  Genitourinary: Rectum normal, prostate normal and penis normal. Guaiac negative stool. No penile tenderness.  Musculoskeletal: Normal range of motion. He exhibits no edema or tenderness.  Lymphadenopathy:    He has no cervical adenopathy.    Neurological: He is alert and oriented to person, place, and time. He has normal reflexes. No cranial nerve deficit. He exhibits normal muscle tone. Coordination normal.  Skin: Skin is warm and dry. No rash noted. He is not diaphoretic. No erythema. No pallor.  Psychiatric: He has a normal mood and affect. His behavior is normal. Judgment and thought content normal.          Assessment & Plan:  Well exam. Get labs today.

## 2014-09-01 NOTE — Progress Notes (Signed)
Pre visit review using our clinic review tool, if applicable. No additional management support is needed unless otherwise documented below in the visit note. 

## 2014-09-02 ENCOUNTER — Telehealth: Payer: Self-pay | Admitting: Family Medicine

## 2014-09-02 NOTE — Telephone Encounter (Signed)
emmi mailed  °

## 2014-09-27 ENCOUNTER — Other Ambulatory Visit: Payer: Self-pay | Admitting: Family Medicine

## 2014-09-30 ENCOUNTER — Telehealth: Payer: Self-pay | Admitting: Family Medicine

## 2014-09-30 NOTE — Telephone Encounter (Signed)
[facility] from [facility], do we need to discontinue the Dutasteride from pt's medication list? If so then we need to call and cancel at pharmacy?

## 2014-09-30 NOTE — Telephone Encounter (Signed)
This is a duplicate note, see previous one.  

## 2014-10-01 NOTE — Telephone Encounter (Signed)
PA for dutasteride was denied 08/28/14, the denial letter is scanned into the chart for review if needed.  Patient's plan requires patient to try and fail 2 formulary alternatives.  I copied names of medications from the formulary and it is listed below:   doxazosin mesylate tab 1 mg 2 QL (30 tablets/30 days)  doxazosin mesylate tab 2 mg 2 QL (30 tablets/30 days)  doxazosin mesylate tab 4 mg 2 QL (30 tablets/30 days)  doxazosin mesylate tab 8 mg 2 QL (60 tablets/30 days)   finasteride tab 5 mg 2 QL (30 tablets/30 days)   prazosin hcl cap 1 mg 2  prazosin hcl cap 2 mg 2  prazosin hcl cap 5 mg 2   tamsulosin hcl cap 0.4 mg 2 QL (60 capsules/30 days)  terazosin hcl cap 1 mg 1 QL (30 capsules/30 days)  terazosin hcl cap 2 mg 1 QL (30 capsules/30 days)  terazosin hcl cap 5 mg 1 QL (30 capsules/30 days)  terazosin hcl cap 10 mg 1 QL (60 capsules/30 days)

## 2014-10-01 NOTE — Telephone Encounter (Signed)
If we need to do a PA for the Avodart, please do so.

## 2014-10-04 NOTE — Telephone Encounter (Signed)
This looks fine

## 2014-10-04 NOTE — Addendum Note (Signed)
Addended by: Aggie Hacker A on: 10/04/2014 10:11 AM   Modules accepted: Orders, Medications

## 2014-10-04 NOTE — Telephone Encounter (Signed)
Pt wants Dr. Sarajane Jews to review current medication list to make sure everything is correct.

## 2014-10-04 NOTE — Telephone Encounter (Signed)
Per Dr. Sarajane Jews we can order Finasteride 5 mg take 1 po qd.

## 2014-10-04 NOTE — Telephone Encounter (Signed)
I spoke with pt and he has been taken off of Avodart. I updated him medication list and put a copy in the mail to pt.

## 2014-10-06 ENCOUNTER — Encounter: Payer: Self-pay | Admitting: Family Medicine

## 2014-10-06 ENCOUNTER — Ambulatory Visit (INDEPENDENT_AMBULATORY_CARE_PROVIDER_SITE_OTHER): Payer: Medicare Other | Admitting: Family Medicine

## 2014-10-06 VITALS — BP 100/66 | HR 107 | Temp 98.6°F | Ht 71.5 in | Wt 206.0 lb

## 2014-10-06 DIAGNOSIS — J1189 Influenza due to unidentified influenza virus with other manifestations: Secondary | ICD-10-CM

## 2014-10-06 DIAGNOSIS — J111 Influenza due to unidentified influenza virus with other respiratory manifestations: Secondary | ICD-10-CM

## 2014-10-06 MED ORDER — OSELTAMIVIR PHOSPHATE 75 MG PO CAPS: 75.0000 mg | ORAL_CAPSULE | Freq: Two times a day (BID) | ORAL | Status: DC

## 2014-10-06 NOTE — Progress Notes (Signed)
   Subjective:    Patient ID: Calvin [name], male    DOB: Apr 29, 1946, 69 y.o.   [name]: [id number]  HPI Here for 2 days of fever to 100 degrees, body aches, a dry cough, and weakness. Some nausea but no vomiting. On Tylenol.    Review of Systems  Constitutional: Positive for fever and fatigue.  HENT: Negative.   Eyes: Negative.   Respiratory: Positive for cough.   Gastrointestinal: Positive for nausea. Negative for vomiting, abdominal pain, diarrhea and abdominal distention.       Objective:   Physical Exam  Constitutional:  Appears ill  HENT:  Right Ear: External ear normal.  Left Ear: External ear normal.  Nose: Nose normal.  Mouth/Throat: Oropharynx is clear and moist.  Eyes: Conjunctivae are normal.  Pulmonary/Chest: Effort normal and breath sounds normal.  Lymphadenopathy:    He has no cervical adenopathy.          Assessment & Plan:  Given Tamiflu. Drink plenty of fluids.

## 2014-10-06 NOTE — Progress Notes (Signed)
Pre visit review using our clinic review tool, if applicable. No additional management support is needed unless otherwise documented below in the visit note. 

## 2014-10-28 ENCOUNTER — Ambulatory Visit (INDEPENDENT_AMBULATORY_CARE_PROVIDER_SITE_OTHER): Payer: Medicare Other | Admitting: Family Medicine

## 2014-10-28 ENCOUNTER — Encounter: Payer: Self-pay | Admitting: Family Medicine

## 2014-10-28 VITALS — BP 112/80 | HR 94 | Temp 98.6°F | Wt 207.0 lb

## 2014-10-28 DIAGNOSIS — B349 Viral infection, unspecified: Secondary | ICD-10-CM | POA: Diagnosis not present

## 2014-10-28 NOTE — Progress Notes (Signed)
Pre visit review using our clinic review tool, if applicable. No additional management support is needed unless otherwise documented below in the visit note. 

## 2014-10-28 NOTE — Patient Instructions (Signed)
Food Choices to Help Relieve Diarrhea °When you have diarrhea, the foods you eat and your eating habits are very important. Choosing the right foods and drinks can help relieve diarrhea. Also, because diarrhea can last up to 7 days, you need to replace lost fluids and electrolytes (such as sodium, potassium, and chloride) in order to help prevent dehydration.  °WHAT GENERAL GUIDELINES DO I NEED TO FOLLOW? °· Slowly drink 1 cup (8 oz) of fluid for each episode of diarrhea. If you are getting enough fluid, your urine will be clear or pale yellow. °· Eat starchy foods. Some good choices include white rice, white toast, pasta, low-fiber cereal, baked potatoes (without the skin), saltine crackers, and bagels. °· Avoid large servings of any cooked vegetables. °· Limit fruit to two servings per day. A serving is ½ cup or 1 small piece. °· Choose foods with less than 2 g of fiber per serving. °· Limit fats to less than 8 tsp (38 g) per day. °· Avoid fried foods. °· Eat foods that have probiotics in them. Probiotics can be found in certain dairy products. °· Avoid foods and beverages that may increase the speed at which food moves through the stomach and intestines (gastrointestinal tract). Things to avoid include: °¨ High-fiber foods, such as dried fruit, raw fruits and vegetables, nuts, seeds, and whole grain foods. °¨ Spicy foods and high-fat foods. °¨ Foods and beverages sweetened with high-fructose corn syrup, honey, or sugar alcohols such as xylitol, sorbitol, and mannitol. °WHAT FOODS ARE RECOMMENDED? °Grains °White rice. White, French, or pita breads (fresh or toasted), including plain rolls, buns, or bagels. White pasta. Saltine, soda, or graham crackers. Pretzels. Low-fiber cereal. Cooked cereals made with water (such as cornmeal, farina, or cream cereals). Plain muffins. Matzo. Melba toast. Zwieback.  °Vegetables °Potatoes (without the skin). Strained tomato and vegetable juices. Most well-cooked and canned  vegetables without seeds. Tender lettuce. °Fruits °Cooked or canned applesauce, apricots, cherries, fruit cocktail, grapefruit, peaches, pears, or plums. Fresh bananas, apples without skin, cherries, grapes, cantaloupe, grapefruit, peaches, oranges, or plums.  °Meat and Other Protein Products °Baked or boiled chicken. Eggs. Tofu. Fish. Seafood. Smooth peanut butter. Ground or well-cooked tender beef, ham, veal, lamb, pork, or poultry.  °Dairy °Plain yogurt, kefir, and unsweetened liquid yogurt. Lactose-free milk, buttermilk, or soy milk. Plain hard cheese. °Beverages °Sport drinks. Clear broths. Diluted fruit juices (except prune). Regular, caffeine-free sodas such as ginger ale. Water. Decaffeinated teas. Oral rehydration solutions. Sugar-free beverages not sweetened with sugar alcohols. °Other °Bouillon, broth, or soups made from recommended foods.  °The items listed above may not be a complete list of recommended foods or beverages. Contact your dietitian for more options. °WHAT FOODS ARE NOT RECOMMENDED? °Grains °Whole grain, whole wheat, bran, or rye breads, rolls, pastas, crackers, and cereals. Wild or brown rice. Cereals that contain more than 2 g of fiber per serving. Corn tortillas or taco shells. Cooked or dry oatmeal. Granola. Popcorn. °Vegetables °Raw vegetables. Cabbage, broccoli, Brussels sprouts, artichokes, baked beans, beet greens, corn, kale, legumes, peas, sweet potatoes, and yams. Potato skins. Cooked spinach and cabbage. °Fruits °Dried fruit, including raisins and dates. Raw fruits. Stewed or dried prunes. Fresh apples with skin, apricots, mangoes, pears, raspberries, and strawberries.  °Meat and Other Protein Products °Chunky peanut butter. Nuts and seeds. Beans and lentils. Bacon.  °Dairy °High-fat cheeses. Milk, chocolate milk, and beverages made with milk, such as milk shakes. Cream. Ice cream. °Sweets and Desserts °Sweet rolls, doughnuts, and sweet breads. Pancakes   and waffles. °Fats and  Oils °Butter. Cream sauces. Margarine. Salad oils. Plain salad dressings. Olives. Avocados.  °Beverages °Caffeinated beverages (such as coffee, tea, soda, or energy drinks). Alcoholic beverages. Fruit juices with pulp. Prune juice. Soft drinks sweetened with high-fructose corn syrup or sugar alcohols. °Other °Coconut. Hot sauce. Chili powder. Mayonnaise. Gravy. Cream-based or milk-based soups.  °The items listed above may not be a complete list of foods and beverages to avoid. Contact your dietitian for more information. °WHAT SHOULD I DO IF I BECOME DEHYDRATED? °Diarrhea can sometimes lead to dehydration. Signs of dehydration include dark urine and dry mouth and skin. If you think you are dehydrated, you should rehydrate with an oral rehydration solution. These solutions can be purchased at pharmacies, retail stores, or online.  °Drink ½-1 cup (120-240 mL) of oral rehydration solution each time you have an episode of diarrhea. If drinking this amount makes your diarrhea worse, try drinking smaller amounts more often. For example, drink 1-3 tsp (5-15 mL) every 5-10 minutes.  °A general rule for staying hydrated is to drink 1½-2 L of fluid per day. Talk to your health care provider about the specific amount you should be drinking each day. Drink enough fluids to keep your urine clear or pale yellow. °Document Released: 08/25/2003 Document Revised: 06/09/2013 Document Reviewed: 04/27/2013 °ExitCare® Patient Information ©2015 [employer]. This information is not intended to replace advice given to you by your health care provider. Make sure you discuss any questions you have with your health care provider. °Diarrhea °Diarrhea is frequent loose and watery bowel movements. It can cause you to feel weak and dehydrated. Dehydration can cause you to become tired and thirsty, have a dry mouth, and have decreased urination that often is dark yellow. Diarrhea is a sign of another problem, most often an infection that will  not last long. In most cases, diarrhea typically lasts 2-3 days. However, it can last longer if it is a sign of something more serious. It is important to treat your diarrhea as directed by your caregiver to lessen or prevent future episodes of diarrhea. °CAUSES  °Some common causes include: °· Gastrointestinal infections caused by viruses, bacteria, or parasites. °· Food poisoning or food allergies. °· Certain medicines, such as antibiotics, chemotherapy, and laxatives. °· Artificial sweeteners and fructose. °· Digestive disorders. °HOME CARE INSTRUCTIONS °· Ensure adequate fluid intake (hydration): Have 1 cup (8 oz) of fluid for each diarrhea episode. Avoid fluids that contain simple sugars or sports drinks, fruit juices, whole milk products, and sodas. Your urine should be clear or pale yellow if you are drinking enough fluids. Hydrate with an oral rehydration solution that you can purchase at pharmacies, retail stores, and online. You can prepare an oral rehydration solution at home by mixing the following ingredients together: °¨  - tsp table salt. °¨ ¾ tsp baking soda. °¨  tsp salt substitute containing potassium chloride. °¨ 1  tablespoons sugar. °¨ 1 L (34 oz) of water. °· Certain foods and beverages may increase the speed at which food moves through the gastrointestinal (GI) tract. These foods and beverages should be avoided and include: °¨ Caffeinated and alcoholic beverages. °¨ High-fiber foods, such as raw fruits and vegetables, nuts, seeds, and whole grain breads and cereals. °¨ Foods and beverages sweetened with sugar alcohols, such as xylitol, sorbitol, and mannitol. °· Some foods may be well tolerated and may help thicken stool including: °¨ Starchy foods, such as rice, toast, pasta, low-sugar cereal, oatmeal, grits, baked potatoes,   crackers, and bagels. °¨ Bananas. °¨ Applesauce. °· Add probiotic-rich foods to help increase healthy bacteria in the GI tract, such as yogurt and fermented milk  products. °· Wash your hands well after each diarrhea episode. °· Only take over-the-counter or prescription medicines as directed by your caregiver. °· Take a warm bath to relieve any burning or pain from frequent diarrhea episodes. °SEEK IMMEDIATE MEDICAL CARE IF:  °· You are unable to keep fluids down. °· You have persistent vomiting. °· You have blood in your stool, or your stools are black and tarry. °· You do not urinate in 6-8 hours, or there is only a small amount of very dark urine. °· You have abdominal pain that increases or localizes. °· You have weakness, dizziness, confusion, or light-headedness. °· You have a severe headache. °· Your diarrhea gets worse or does not get better. °· You have a fever or persistent symptoms for more than 2-3 days. °· You have a fever and your symptoms suddenly get worse. °MAKE SURE YOU:  °· Understand these instructions. °· Will watch your condition. °· Will get help right away if you are not doing well or get worse. °Document Released: 05/25/2002 Document Revised: 10/19/2013 Document Reviewed: 02/10/2012 °ExitCare® Patient Information ©2015 [employer]. This information is not intended to replace advice given to you by your health care provider. Make sure you discuss any questions you have with your health care provider. ° °

## 2014-10-28 NOTE — Progress Notes (Signed)
   Subjective:    Patient ID: Calvin [name], male    DOB: 1946/05/08, 69 y.o.   [name]: [id number]  HPI  Patient seen with 2 day history of vomiting, diarrhea, cough. His cough is somewhat more chronic. Vomiting and diarrhea are actually somewhat better today. He complains of increased malaise. Is keeping down some fluids. Low-grade fever yesterday around 100. Some body aches and chills. He was just getting over recent respiratory illness and treated with Calvin [name] flu a few weeks ago. Diarrhea was watery and nonbloody. No recent travels. No recent antibiotics. No further vomiting today. Cough nonproductive.  Past Medical History  Diagnosis Date  . Headache(784.0)   . Hypertension   . ED (erectile dysfunction)   . Nephrolithiasis     hx of had lithotripsy in 1-10.  Marland Kitchen Benign prostatic hypertrophy   . Hypogonadism male    Past Surgical History  Procedure Laterality Date  . Tonsillectomy    . Colonoscopy  03-28-10    per Dr. Fuller [name], hyperplastic polyps, repeat in 10 yrs   . Staple hemorrhoidectomy  04-14-10    per Dr. Johney [name]   . Laparoscopic appendectomy  03/12/2012    Procedure: APPENDECTOMY LAPAROSCOPIC;  Surgeon: Calvin Bowie, MD;  Location: [facility];  Service: General;  Laterality: N/A;    reports that he has never smoked. He has never used smokeless tobacco. He reports that he drinks alcohol. He reports that he does not use illicit drugs. family history includes Coronary artery disease in his other; Heart disease in his other; Hypertension in his other; Stroke in his other. Allergies  Allergen Reactions  . Sulfonamide Derivatives Other (See Comments)    Reaction unknown     Review of Systems  Constitutional: Positive for chills, appetite change and fatigue.  Respiratory: Negative for shortness of breath.   Cardiovascular: Negative for chest pain.  Gastrointestinal: Positive for nausea, vomiting and diarrhea. Negative for blood in stool.       Objective:   Physical Exam    Constitutional: He appears well-developed and well-nourished.  HENT:  Mouth/Throat: Oropharynx is clear and moist.  Neck: Neck supple.  Cardiovascular: Normal rate and regular rhythm.   Pulmonary/Chest: Effort normal and breath sounds normal. No respiratory distress. He has no wheezes. He has no rales.          Assessment & [name]:  Viral syndrome. Symptoms are actually somewhat better today. He has fatigue which is likely secondary. Handout on diarrhea given. Increase potassium rich foods such as bananas. Follow-up with primary if fatigue not improving over the next few days

## 2014-12-02 ENCOUNTER — Other Ambulatory Visit: Payer: Self-pay | Admitting: Family Medicine

## 2014-12-03 ENCOUNTER — Other Ambulatory Visit: Payer: Self-pay | Admitting: *Deleted

## 2014-12-03 MED ORDER — TEMAZEPAM 30 MG PO CAPS: ORAL_CAPSULE | ORAL | Status: DC

## 2014-12-03 NOTE — Telephone Encounter (Signed)
Refill for 6 months. 

## 2014-12-03 NOTE — Telephone Encounter (Signed)
Patient request a refill of Temazepam 30 mg, one capsule by mouth at bedtime [facility]

## 2014-12-08 ENCOUNTER — Encounter: Payer: Self-pay | Admitting: Family Medicine

## 2014-12-08 ENCOUNTER — Ambulatory Visit (INDEPENDENT_AMBULATORY_CARE_PROVIDER_SITE_OTHER): Payer: Medicare Other | Admitting: Family Medicine

## 2014-12-08 ENCOUNTER — Telehealth: Payer: Self-pay | Admitting: Family Medicine

## 2014-12-08 VITALS — BP 158/82 | HR 101 | Temp 98.9°F | Ht 71.5 in | Wt 210.0 lb

## 2014-12-08 DIAGNOSIS — F329 Major depressive disorder, single episode, unspecified: Secondary | ICD-10-CM

## 2014-12-08 DIAGNOSIS — F32A Depression, unspecified: Secondary | ICD-10-CM | POA: Insufficient documentation

## 2014-12-08 MED ORDER — BUPROPION HCL ER (XL) 150 MG PO TB24: 150.0000 mg | ORAL_TABLET | Freq: Every day | ORAL | Status: DC

## 2014-12-08 MED ORDER — LORAZEPAM 1 MG PO TABS: 1.0000 mg | ORAL_TABLET | Freq: Three times a day (TID) | ORAL | Status: DC | PRN

## 2014-12-08 NOTE — Telephone Encounter (Signed)
PHQ9 test done on pt.  He is not a harm to anyone or himself.  He denies thoughts of suicide.  He sleeps all day and doesn't eat.  It is work related and has been going on x1 mth.  Told pt he could go to [facility] and be evaluated by [facility] and declined stating "it was not that serious".  Spoke with Dr Sarajane Jews and per Dr Sarajane Jews he will see him this afternoon.  Pt aware

## 2014-12-08 NOTE — Telephone Encounter (Signed)
I spoke with pt and he needs to be seen today, he cannot wait until tomorrow. He is severely depressed and does not know how he would get through the rest of the day. Pt is on the schedule today to see Dr. Sarajane Jews.

## 2014-12-08 NOTE — Progress Notes (Signed)
   Subjective:    Patient ID: Calvin [name], male    DOB: 05-24-1946, 69 y.o.   [name]: [id number]  HPI Here asking for help with depression. We have talked about it several times but he did not think he needed treatment until now. Over the past year he has had a number of stressful situations at work which weigh on his mind. He feels sad, hopeless, he worries about things, and he gets irritable. He has trouble sleeping at times but is appetite is good. He admits that he has been drinking more than he should because this helps him relax. He admits to some suicidal thoughts but denies any plans., and he says he would never try to harm himself.,    Review of Systems  Constitutional: Negative.   Neurological: Negative.   Psychiatric/Behavioral: Positive for sleep disturbance, dysphoric mood and decreased concentration. Negative for suicidal ideas, hallucinations, behavioral problems, confusion, self-injury and agitation. The patient is nervous/anxious.        Objective:   Physical Exam  Constitutional: He is oriented to person, place, and time. He appears well-developed and well-nourished.  Neurological: He is alert and oriented to person, place, and time.  Psychiatric: He has a normal mood and affect. His behavior is normal. Judgment and thought content normal.          Assessment & Plan:  He is depressed and we agreed to start medications for this. He will try Wellbutrin daily and add Ativan prn. I suggested he also see a therapist, but he declines at this time. We will follow up in 3-4 weeks. We spent 45 minutes face to face discussing this today.

## 2014-12-08 NOTE — Telephone Encounter (Signed)
Pt needs an appt he thinks he is depression. Can I use sda slot today?

## 2014-12-08 NOTE — Progress Notes (Signed)
Pre visit review using our clinic review tool, if applicable. No additional management support is needed unless otherwise documented below in the visit note. 

## 2015-01-27 ENCOUNTER — Other Ambulatory Visit: Payer: Self-pay | Admitting: Family Medicine

## 2015-01-27 NOTE — Telephone Encounter (Signed)
Pt was last here on 12/08/14 and due for a refill.

## 2015-01-27 NOTE — Telephone Encounter (Signed)
OK to refill for one month 

## 2015-02-22 ENCOUNTER — Other Ambulatory Visit: Payer: Self-pay | Admitting: Adult Health

## 2015-02-22 ENCOUNTER — Other Ambulatory Visit: Payer: Self-pay | Admitting: Family Medicine

## 2015-02-24 ENCOUNTER — Encounter: Payer: Self-pay | Admitting: Family Medicine

## 2015-02-24 ENCOUNTER — Ambulatory Visit (INDEPENDENT_AMBULATORY_CARE_PROVIDER_SITE_OTHER): Payer: Medicare Other | Admitting: Family Medicine

## 2015-02-24 VITALS — BP 135/92 | HR 100 | Temp 98.2°F | Ht 71.5 in | Wt 213.0 lb

## 2015-02-24 DIAGNOSIS — Z23 Encounter for immunization: Secondary | ICD-10-CM

## 2015-02-24 DIAGNOSIS — F329 Major depressive disorder, single episode, unspecified: Secondary | ICD-10-CM

## 2015-02-24 DIAGNOSIS — F32A Depression, unspecified: Secondary | ICD-10-CM

## 2015-02-24 MED ORDER — BUPROPION HCL ER (XL) 300 MG PO TB24: 300.0000 mg | ORAL_TABLET | Freq: Every day | ORAL | Status: DC

## 2015-02-24 MED ORDER — LORAZEPAM 1 MG PO TABS: 1.0000 mg | ORAL_TABLET | Freq: Three times a day (TID) | ORAL | Status: DC | PRN

## 2015-02-24 NOTE — Progress Notes (Signed)
   Subjective:    Patient ID: Calvin [name], male    DOB: 1945/11/01, 68 y.o.   [name]: [id number]  HPI Here to follow up on depression and overuse of alcohol. Since we met about 6 weeks ago he has been doing a little better. He has cut back some on the alcohol use. He finds the Lorazepam to be very helpful for the anxiety. He does not think the Wellbutrin is helping much however, as he still struggles with depression. His sleep has improved.    Review of Systems  Constitutional: Negative.   Respiratory: Negative.   Cardiovascular: Negative.   Neurological: Negative.   Psychiatric/Behavioral: Positive for sleep disturbance and dysphoric mood. Negative for suicidal ideas, confusion, self-injury, decreased concentration and agitation. The patient is nervous/anxious.        Objective:   Physical Exam  Constitutional: He is oriented to person, place, and time. He appears well-developed and well-nourished.  Cardiovascular: Normal rate, regular rhythm, normal heart sounds and intact distal pulses.   Pulmonary/Chest: Effort normal and breath sounds normal.  Neurological: He is alert and oriented to person, place, and time.  Psychiatric: He has a normal mood and affect. His behavior is normal. Judgment and thought content normal.          Assessment & Plan:  His depression is under better control but we need to adjust his medications. Increase Wellbutrin XL to 300 mg daily. Recheck one month

## 2015-02-24 NOTE — Progress Notes (Signed)
Pre visit review using our clinic review tool, if applicable. No additional management support is needed unless otherwise documented below in the visit note. 

## 2015-03-01 ENCOUNTER — Ambulatory Visit: Payer: Medicare Other | Admitting: Family Medicine

## 2015-06-15 ENCOUNTER — Other Ambulatory Visit: Payer: Self-pay | Admitting: Family Medicine

## 2015-06-17 NOTE — Telephone Encounter (Signed)
Call in #30 with 5 rf 

## 2015-08-01 ENCOUNTER — Encounter: Payer: Self-pay | Admitting: Gastroenterology

## 2015-09-01 ENCOUNTER — Telehealth: Payer: Self-pay

## 2015-09-01 NOTE — Telephone Encounter (Signed)
Patient has requested a refill on Lorazepam 1mg  tablets - Last refilled on 02/24/15 for #60 with 3 refills. Ok to refill this medication?

## 2015-09-02 NOTE — Telephone Encounter (Signed)
Call in #60 with 5 rf 

## 2015-09-05 ENCOUNTER — Telehealth: Payer: Self-pay

## 2015-09-05 MED ORDER — LORAZEPAM 1 MG PO TABS: 1.0000 mg | ORAL_TABLET | Freq: Three times a day (TID) | ORAL | Status: DC | PRN

## 2015-09-05 NOTE — Addendum Note (Signed)
Addended by: Aggie Hacker A on: 09/05/2015 01:50 PM   Modules accepted: Orders

## 2015-09-05 NOTE — Telephone Encounter (Signed)
Pt requesting LORazepam (ATIVAN) 1 MG tablet Pt last visit 02/24/15 Pt last refill 02/24/15 #60 with 3 refills

## 2015-09-05 NOTE — Telephone Encounter (Signed)
I called in script 

## 2015-09-06 NOTE — Telephone Encounter (Signed)
This was called in yesterday.

## 2015-09-22 ENCOUNTER — Emergency Department (HOSPITAL_COMMUNITY)
Admission: EM | Admit: 2015-09-22 | Discharge: 2015-09-23 | Disposition: A | Discharge status: Home / Self Care | Payer: Medicare Other | Attending: Emergency Medicine | Admitting: Emergency Medicine

## 2015-09-22 ENCOUNTER — Telehealth: Payer: Self-pay | Admitting: Family Medicine

## 2015-09-22 ENCOUNTER — Encounter (HOSPITAL_COMMUNITY): Payer: Self-pay | Admitting: Emergency Medicine

## 2015-09-22 ENCOUNTER — Ambulatory Visit: Payer: Medicare Other | Admitting: Family Medicine

## 2015-09-22 DIAGNOSIS — S01101A Unspecified open wound of right eyelid and periocular area, initial encounter: Secondary | ICD-10-CM | POA: Diagnosis not present

## 2015-09-22 DIAGNOSIS — Z87442 Personal history of urinary calculi: Secondary | ICD-10-CM | POA: Diagnosis not present

## 2015-09-22 DIAGNOSIS — Y9389 Activity, other specified: Secondary | ICD-10-CM | POA: Diagnosis not present

## 2015-09-22 DIAGNOSIS — W06XXXA Fall from bed, initial encounter: Secondary | ICD-10-CM | POA: Diagnosis not present

## 2015-09-22 DIAGNOSIS — N529 Male erectile dysfunction, unspecified: Secondary | ICD-10-CM | POA: Diagnosis not present

## 2015-09-22 DIAGNOSIS — Z8639 Personal history of other endocrine, nutritional and metabolic disease: Secondary | ICD-10-CM | POA: Diagnosis not present

## 2015-09-22 DIAGNOSIS — Y9289 Other specified places as the place of occurrence of the external cause: Secondary | ICD-10-CM | POA: Insufficient documentation

## 2015-09-22 DIAGNOSIS — I10 Essential (primary) hypertension: Secondary | ICD-10-CM | POA: Insufficient documentation

## 2015-09-22 DIAGNOSIS — S0181XA Laceration without foreign body of other part of head, initial encounter: Secondary | ICD-10-CM | POA: Insufficient documentation

## 2015-09-22 DIAGNOSIS — Y998 Other external cause status: Secondary | ICD-10-CM | POA: Diagnosis not present

## 2015-09-22 DIAGNOSIS — Z23 Encounter for immunization: Secondary | ICD-10-CM | POA: Insufficient documentation

## 2015-09-22 DIAGNOSIS — Z79899 Other long term (current) drug therapy: Secondary | ICD-10-CM | POA: Insufficient documentation

## 2015-09-22 DIAGNOSIS — W19XXXA Unspecified fall, initial encounter: Secondary | ICD-10-CM

## 2015-09-22 DIAGNOSIS — S0990XA Unspecified injury of head, initial encounter: Secondary | ICD-10-CM | POA: Diagnosis present

## 2015-09-22 DIAGNOSIS — N400 Enlarged prostate without lower urinary tract symptoms: Secondary | ICD-10-CM | POA: Diagnosis not present

## 2015-09-22 LAB — COMPREHENSIVE METABOLIC PANEL
ALT: 165 U/L — ABNORMAL HIGH (ref 17–63)
ANION GAP: 12 (ref 5–15)
AST: 108 U/L — ABNORMAL HIGH (ref 15–41)
Albumin: 3.8 g/dL (ref 3.5–5.0)
Alkaline Phosphatase: 67 U/L (ref 38–126)
BILIRUBIN TOTAL: 1 mg/dL (ref 0.3–1.2)
BUN: 10 mg/dL (ref 6–20)
CO2: 24 mmol/L (ref 22–32)
Calcium: 9.3 mg/dL (ref 8.9–10.3)
Chloride: 104 mmol/L (ref 101–111)
Creatinine, Ser: 0.92 mg/dL (ref 0.61–1.24)
GFR calc Af Amer: >60 mL/min (ref >60)
Glucose, Bld: 104 mg/dL — ABNORMAL HIGH (ref 65–99)
POTASSIUM: 3.7 mmol/L (ref 3.5–5.1)
Sodium: 140 mmol/L (ref 135–145)
TOTAL PROTEIN: 7 g/dL (ref 6.5–8.1)

## 2015-09-22 LAB — CBC
HEMATOCRIT: 45 % (ref 39.0–52.0)
Hemoglobin: 15.8 g/dL (ref 13.0–17.0)
MCH: 34.9 pg — ABNORMAL HIGH (ref 26.0–34.0)
MCHC: 35.1 g/dL (ref 30.0–36.0)
MCV: 99.3 fL (ref 78.0–100.0)
PLATELETS: 241 10*3/uL (ref 150–400)
RBC: 4.53 MIL/uL (ref 4.22–5.81)
RDW: 12.2 % (ref 11.5–15.5)
WBC: 7.4 10*3/uL (ref 4.0–10.5)

## 2015-09-22 LAB — ETHANOL

## 2015-09-22 MED ORDER — HYDROCHLOROTHIAZIDE 25 MG PO TABS
25.0000 mg | ORAL_TABLET | Freq: Once | ORAL | Status: AC
  Administered 2015-09-23: 25 mg via ORAL
  Filled 2015-09-22: qty 1

## 2015-09-22 MED ORDER — LOSARTAN POTASSIUM 50 MG PO TABS
100.0000 mg | ORAL_TABLET | Freq: Once | ORAL | Status: AC
  Administered 2015-09-23: 100 mg via ORAL
  Filled 2015-09-22: qty 2

## 2015-09-22 MED ORDER — AMLODIPINE BESYLATE 5 MG PO TABS
5.0000 mg | ORAL_TABLET | Freq: Once | ORAL | Status: AC
  Administered 2015-09-23: 5 mg via ORAL
  Filled 2015-09-22: qty 1

## 2015-09-22 MED ORDER — LORAZEPAM 2 MG/ML IJ SOLN
1.0000 mg | Freq: Once | INTRAMUSCULAR | Status: AC
  Administered 2015-09-22: 1 mg via INTRAVENOUS
  Filled 2015-09-22: qty 1

## 2015-09-22 MED ORDER — LOSARTAN POTASSIUM-HCTZ 100-25 MG PO TABS: 1.0000 | ORAL_TABLET | Freq: Once | ORAL | Status: DC

## 2015-09-22 MED ORDER — TETANUS-DIPHTH-ACELL PERTUSSIS 5-2.5-18.5 LF-MCG/0.5 IM SUSP
0.5000 mL | Freq: Once | INTRAMUSCULAR | Status: AC
  Administered 2015-09-22: 0.5 mL via INTRAMUSCULAR
  Filled 2015-09-22: qty 0.5

## 2015-09-22 MED ORDER — LIDOCAINE-EPINEPHRINE (PF) 2 %-1:200000 IJ SOLN
20.0000 mL | Freq: Once | INTRAMUSCULAR | Status: AC
  Administered 2015-09-22: 20 mL
  Filled 2015-09-22: qty 20

## 2015-09-22 MED ORDER — SODIUM CHLORIDE 0.9 % IV BOLUS (SEPSIS)
1000.0000 mL | Freq: Once | INTRAVENOUS | Status: AC
  Administered 2015-09-22: 1000 mL via INTRAVENOUS

## 2015-09-22 MED ORDER — CEFAZOLIN SODIUM-DEXTROSE 2-4 GM/100ML-% IV SOLN
2.0000 g | Freq: Once | INTRAVENOUS | Status: AC
  Administered 2015-09-22: 2 g via INTRAVENOUS
  Filled 2015-09-22: qty 100

## 2015-09-22 NOTE — ED Provider Notes (Addendum)
CSN: [id number]     Arrival date & time 09/22/15  1754 History   First MD Initiated Contact with Patient 09/22/15 2124     Chief Complaint  Patient presents with  . Facial Injury     (Consider location/radiation/quality/duration/timing/severity/associated sxs/prior Treatment) Patient is a 70 y.o. male presenting with facial injury. The history is provided by the patient.  Facial Injury Associated symptoms: no epistaxis, no nausea, no neck pain and no vomiting   Patient indicates last night sustained facial injuries at some point during the night.   Indicates last pm felt normal, at baseline.  Had a few alcoholic beverages last night, states drinks several x per week. Patient awoke this AM, around 9 am, and noted pain to face, multiple lacerations, blood at foot of bed around bed post, and blood in bathroom.  States he typically gets up to go to bathroom atleast a couple times per night. Pt has no idea what time he fell, and no recollection of fall. C/o multiple facial lacerations and contusions. Mild headache. No neck or back pain. No change in vision or speech. No numbness/weakness. Denies other pain or injury.       Past Medical History  Diagnosis Date  . Headache(784.0)   . Hypertension   . ED (erectile dysfunction)   . Nephrolithiasis     hx of had lithotripsy in 1-10.  Marland Kitchen Benign prostatic hypertrophy   . Hypogonadism male    Past Surgical History  Procedure Laterality Date  . Tonsillectomy    . Colonoscopy  03-28-10    per Dr. Fuller Plan, hyperplastic polyps, repeat in 10 yrs   . Staple hemorrhoidectomy  04-14-10    per Dr. Johney Maine   . Laparoscopic appendectomy  03/12/2012    Procedure: APPENDECTOMY LAPAROSCOPIC;  Surgeon: Harl Bowie, MD;  Location: [facility] OR;  Service: General;  Laterality: N/A;   Family History  Problem Relation Age of Onset  . Hypertension Other   . Stroke Other   . Heart disease Other     rheumatic   . Coronary artery disease Other    Social History   Substance Use Topics  . Smoking status: Never Smoker   . Smokeless tobacco: Never Used  . Alcohol Use: 0.0 oz/week    0 Standard drinks or equivalent per week     Comment: couple of drinks or more per day    Review of Systems  Constitutional: Negative for fever.  HENT: Negative for nosebleeds and sore throat.   Eyes: Negative for pain and visual disturbance.  Respiratory: Negative for cough and shortness of breath.   Cardiovascular: Negative for chest pain.  Gastrointestinal: Negative for nausea, vomiting and abdominal pain.  Genitourinary: Negative for flank pain.  Musculoskeletal: Negative for back pain and neck pain.  Skin: Positive for wound.  Neurological: Negative for weakness and numbness.  Hematological: Does not bruise/bleed easily.  Psychiatric/Behavioral: Negative for confusion.      Allergies  Sulfonamide derivatives  Home Medications   Prior to Admission medications   Medication Sig Start Date End Date Taking? Authorizing Provider  amLODipine (NORVASC) 5 MG tablet Take 1 tablet (5 mg total) by mouth daily. 09/01/14   Laurey Morale, MD  buPROPion (WELLBUTRIN XL) 300 MG 24 hr tablet Take 1 tablet (300 mg total) by mouth daily. 02/24/15   Laurey Morale, MD  LORazepam (ATIVAN) 1 MG tablet Take 1 tablet (1 mg total) by mouth every 8 (eight) hours as needed for anxiety. 09/05/15  Laurey Morale, MD  losartan-hydrochlorothiazide (HYZAAR) 100-25 MG per tablet Take 1 tablet by mouth daily. 09/01/14   Laurey Morale, MD  sildenafil (VIAGRA) 100 MG tablet Take 1 tablet (100 mg total) by mouth as needed for erectile dysfunction. Patient not taking: Reported on 02/24/2015 09/01/14   Laurey Morale, MD  tamsulosin (FLOMAX) 0.4 MG CAPS capsule Take 1 capsule (0.4 mg total) by mouth daily. 09/01/14   Laurey Morale, MD  temazepam (RESTORIL) 30 MG capsule TAKE ONE CAPSULE BY MOUTH AT BEDTIME AS NEEDED 06/17/15   Laurey Morale, MD   BP 176/119 mmHg  Pulse 112  Temp(Src) 98 F (36.7 C)  (Oral)  Resp 16  SpO2 98% Physical Exam  Constitutional: He is oriented to person, place, and time. He appears well-developed and well-nourished. No distress.  HENT:  Multiple healing/scabbed over lacerations to right face and forehead.  Patient has one open wound approx 1-2 cm lateral to right eye. Right eyelid swollen.   Eyes: Conjunctivae and EOM are normal. Pupils are equal, round, and reactive to light. No scleral icterus.  No hyphema.   Neck: Normal range of motion. Neck supple. No tracheal deviation present.  No bruits  Cardiovascular: Normal rate, regular rhythm, normal heart sounds and intact distal pulses.  Exam reveals no gallop and no friction rub.   No murmur heard. Pulmonary/Chest: Effort normal and breath sounds normal. No accessory muscle usage. No respiratory distress. He exhibits no tenderness.  Abdominal: Soft. Bowel sounds are normal. He exhibits no distension. There is no tenderness.  Musculoskeletal: Normal range of motion. He exhibits no edema or tenderness.  CTLS spine, non tender, aligned, no step off. Good rom bil ext without focal bony tenderness.   Neurological: He is alert and oriented to person, place, and time.  Motor intact bil. stre 5/5. sens grossly intact. Steady gait.   Skin: Skin is warm and dry. No rash noted. He is not diaphoretic.  Psychiatric: He has a normal mood and affect.  Nursing note and vitals reviewed.   ED Course  .Marland KitchenLaceration Repair Date/Time: 09/22/2015 11:23 PM Performed by: Lajean Saver Authorized by: Lajean Saver Consent: Verbal consent obtained. Risks and benefits: risks, benefits and alternatives were discussed Consent given by: patient Body area: head/neck Laceration length: 5 cm Foreign bodies: no foreign bodies Anesthesia: local infiltration Local anesthetic: lidocaine 2% with epinephrine Anesthetic total: 6 ml Preparation: Patient was prepped and draped in the usual sterile fashion. Irrigation solution: saline Amount  of cleaning: extensive Debridement: yes. Degree of undermining: minimal (yes) Skin closure: 5-0 Prolene (loosely approximated skin edges) Number of sutures: 8 Suture technique: interrupted. Approximation difficulty: complex Patient tolerance: Patient tolerated the procedure well with no immediate complications   (including critical care time) Labs Review  Results for orders placed or performed during the hospital encounter of 09/22/15  CBC  Result Value Ref Range   WBC 7.4 4.0 - 10.5 K/uL   RBC 4.53 4.22 - 5.81 MIL/uL   Hemoglobin 15.8 13.0 - 17.0 g/dL   HCT 45.0 39.0 - 52.0 %   MCV 99.3 78.0 - 100.0 fL   MCH 34.9 (H) 26.0 - 34.0 pg   MCHC 35.1 30.0 - 36.0 g/dL   RDW 12.2 11.5 - 15.5 %   Platelets 241 150 - 400 K/uL  Comprehensive metabolic panel  Result Value Ref Range   Sodium 140 135 - 145 mmol/L   Potassium 3.7 3.5 - 5.1 mmol/L   Chloride 104 101 - 111  mmol/L   CO2 24 22 - 32 mmol/L   Glucose, Bld 104 (H) 65 - 99 mg/dL   BUN 10 6 - 20 mg/dL   Creatinine, Ser 0.92 0.61 - 1.24 mg/dL   Calcium 9.3 8.9 - 10.3 mg/dL   Total Protein 7.0 6.5 - 8.1 g/dL   Albumin 3.8 3.5 - 5.0 g/dL   AST 108 (H) 15 - 41 U/L   ALT 165 (H) 17 - 63 U/L   Alkaline Phosphatase 67 38 - 126 U/L   Total Bilirubin 1.0 0.3 - 1.2 mg/dL   GFR calc non Af Amer >60 >60 mL/min   GFR calc Af Amer >60 >60 mL/min   Anion gap 12 5 - 15  Ethanol  Result Value Ref Range   Alcohol, Ethyl (B) <5 <5 mg/dL      I have personally reviewed and evaluated these images and lab results as part of my medical decision-making.   EKG Interpretation   Date/Time:  Thursday September 22 2015 21:53:31 EDT Ventricular Rate:  100 PR Interval:  141 QRS Duration: 87 QT Interval:  339 QTC Calculation: 437 R Axis:   -3 Text Interpretation:  Sinus tachycardia Low voltage, extremity leads No  significant change since last tracing Confirmed by Ashok Cordia  MD, Lennette Bihari  ([id number]) on 09/22/2015 11:22:43 PM      MDM   Labs.  Ct.  Reviewed nursing notes and prior charts for additional history.   Tetanus unknown. Tetanus im.  Discussed risk of infection with closing the large, gaping/open wound vs having sizable cosmetic defect - patient consents to closure of laceration.  Other wounds appear healing with skin edges well approximated.   Ancef iv.   Wound cleaned/irrigated extremely well.   Pt with hx etoh use, ?whether consumption higher than stated. Mildly tachy/shaky appearing. Patient denies hx complicated etoh withdrawal, seizure or dts.  Patient denies needing/wanting help for etoh use/abuse.  Ativan 1 mg.   bp is high - pt indicates he did not take any of his bp meds today - will give home dose.   0013, cts still pending.  Signed out to Dr Claudine Mouton to check cts when back, recheck pt, recheck bp/vitals, and disposition appropriately.   Patient will need to be kept on keflex for few days to help minimize risk wound infection.          Lajean Saver, MD 09/23/15 [phone]

## 2015-09-22 NOTE — Telephone Encounter (Signed)
I called pt to inform that he missed his appointment this morning. Pt stated that he fell getting ready this morning. Per Dr. Sarajane Jews, I advised pt to either go to ER, pt declined that however he will go to a Urgent Care for treatment.

## 2015-09-22 NOTE — ED Notes (Signed)
Pt reports that he woke up this morning and had fallen striking his right orbital on what he believes was the bed. Pt has lacerations to right side of face. Pt alert x4. NAD at this time. Pt denies being on blood thinners.

## 2015-09-23 ENCOUNTER — Emergency Department (HOSPITAL_COMMUNITY): Payer: Medicare Other

## 2015-09-23 NOTE — ED Provider Notes (Signed)
Patient signed out pending CT scan for bony injury.  CT is negative.  He was advised to see his primary care doctor within 3 days for wound check.  Return precautions given. He appears well and in NAD.  VS remain within his normal limits and he is safe for DC.  Everlene Balls, MD 09/23/15 [phone]

## 2015-09-23 NOTE — Discharge Instructions (Signed)
Fall Prevention in the Home  [name], your CT scan did not show any broken bones.  See a primary care doctor within 3 days for a wound check.  Take tylenol or ibuprofen for pain control. If symptoms worsen, come back to the ED immediately. Thank you. Falls can cause injuries. They can happen to people of all ages. There are many things you can do to make your home safe and to help prevent falls.  WHAT CAN I DO ON THE OUTSIDE OF MY HOME?  Regularly fix the edges of walkways and driveways and fix any cracks.  Remove anything that might make you trip as you walk through a door, such as a raised step or threshold.  Trim any bushes or trees on the path to your home.  Use bright outdoor lighting.  Clear any walking paths of anything that might make someone trip, such as rocks or tools.  Regularly check to see if handrails are loose or broken. Make sure that both sides of any steps have handrails.  Any raised decks and porches should have guardrails on the edges.  Have any leaves, snow, or ice cleared regularly.  Use sand or salt on walking paths during winter.  Clean up any spills in your garage right away. This includes oil or grease spills. WHAT CAN I DO IN THE BATHROOM?   Use night lights.  Install grab bars by the toilet and in the tub and shower. Do not use towel bars as grab bars.  Use non-skid mats or decals in the tub or shower.  If you need to sit down in the shower, use a plastic, non-slip stool.  Keep the floor dry. Clean up any water that spills on the floor as soon as it happens.  Remove soap buildup in the tub or shower regularly.  Attach bath mats securely with double-sided non-slip rug tape.  Do not have throw rugs and other things on the floor that can make you trip. WHAT CAN I DO IN THE BEDROOM?  Use night lights.  Make sure that you have a light by your bed that is easy to reach.  Do not use any sheets or blankets that are too big for your bed. They should  not hang down onto the floor.  Have a firm chair that has side arms. You can use this for support while you get dressed.  Do not have throw rugs and other things on the floor that can make you trip. WHAT CAN I DO IN THE KITCHEN?  Clean up any spills right away.  Avoid walking on wet floors.  Keep items that you use a lot in easy-to-reach places.  If you need to reach something above you, use a strong step stool that has a grab bar.  Keep electrical cords out of the way.  Do not use floor polish or wax that makes floors slippery. If you must use wax, use non-skid floor wax.  Do not have throw rugs and other things on the floor that can make you trip. WHAT CAN I DO WITH MY STAIRS?  Do not leave any items on the stairs.  Make sure that there are handrails on both sides of the stairs and use them. Fix handrails that are broken or loose. Make sure that handrails are as long as the stairways.  Check any carpeting to make sure that it is firmly attached to the stairs. Fix any carpet that is loose or worn.  Avoid having throw rugs  at the top or bottom of the stairs. If you do have throw rugs, attach them to the floor with carpet tape.  Make sure that you have a light switch at the top of the stairs and the bottom of the stairs. If you do not have them, ask someone to add them for you. WHAT ELSE CAN I DO TO HELP PREVENT FALLS?  Wear shoes that:  Do not have high heels.  Have rubber bottoms.  Are comfortable and fit you well.  Are closed at the toe. Do not wear sandals.  If you use a stepladder:  Make sure that it is fully opened. Do not climb a closed stepladder.  Make sure that both sides of the stepladder are locked into place.  Ask someone to hold it for you, if possible.  Clearly mark and make sure that you can see:  Any grab bars or handrails.  First and last steps.  Where the edge of each step is.  Use tools that help you move around (mobility aids) if they are  needed. These include:  Canes.  Walkers.  Scooters.  Crutches.  Turn on the lights when you go into a dark area. Replace any light bulbs as soon as they burn out.  Set up your furniture so you have a clear path. Avoid moving your furniture around.  If any of your floors are uneven, fix them.  If there are any pets around you, be aware of where they are.  Review your medicines with your doctor. Some medicines can make you feel dizzy. This can increase your chance of falling. Ask your doctor what other things that you can do to help prevent falls.   This information is not intended to replace advice given to you by your health care provider. Make sure you discuss any questions you have with your health care provider.   Document Released: 03/31/2009 Document Revised: 10/19/2014 Document Reviewed: 07/09/2014 Elsevier Interactive Patient Education [employer].

## 2015-09-26 ENCOUNTER — Ambulatory Visit (INDEPENDENT_AMBULATORY_CARE_PROVIDER_SITE_OTHER): Payer: Medicare Other | Admitting: Internal Medicine

## 2015-09-26 ENCOUNTER — Encounter: Payer: Self-pay | Admitting: Internal Medicine

## 2015-09-26 VITALS — BP 146/90 | HR 115 | Temp 99.1°F | Resp 20 | Ht 71.5 in | Wt 218.0 lb

## 2015-09-26 DIAGNOSIS — I10 Essential (primary) hypertension: Secondary | ICD-10-CM

## 2015-09-26 DIAGNOSIS — Z4802 Encounter for removal of sutures: Secondary | ICD-10-CM

## 2015-09-26 MED ORDER — SILDENAFIL CITRATE 100 MG PO TABS: 100.0000 mg | ORAL_TABLET | ORAL | Status: DC | PRN

## 2015-09-26 NOTE — Progress Notes (Signed)
Pre visit review using our clinic review tool, if applicable. No additional management support is needed unless otherwise documented below in the visit note. 

## 2015-09-26 NOTE — Patient Instructions (Signed)

## 2015-09-26 NOTE — Progress Notes (Signed)
Subjective:    Patient ID: Calvin [name], male    DOB: 1946-01-16, 70 y.o.   [name]: [id number]  HPI   70 year old patient who has essential hypertension who is seen today for suture removal.  He was seen and treated in the ED 4 days ago after a fall resulting in trauma to the right facial area.  Several sutures were placed in the right supraorbital area  Past Medical History  Diagnosis Date  . Headache(784.0)   . Hypertension   . ED (erectile dysfunction)   . Nephrolithiasis     hx of had lithotripsy in 1-10.  Marland Kitchen Benign prostatic hypertrophy   . Hypogonadism male     Social History   Social History  . Marital Status: Widowed    Spouse Name: N/A  . Number of Children: N/A  . Years of Education: N/A   Occupational History  . Not on file.   Social History Main Topics  . Smoking status: Never Smoker   . Smokeless tobacco: Never Used  . Alcohol Use: 0.0 oz/week    0 Standard drinks or equivalent per week     Comment: couple of drinks or more per day  . Drug Use: No  . Sexual Activity: Not on file   Other Topics Concern  . Not on file   Social History Narrative    Past Surgical History  Procedure Laterality Date  . Tonsillectomy    . Colonoscopy  03-28-10    per Dr. Fuller Plan, hyperplastic polyps, repeat in 10 yrs   . Staple hemorrhoidectomy  04-14-10    per Dr. Johney Maine   . Laparoscopic appendectomy  03/12/2012    Procedure: APPENDECTOMY LAPAROSCOPIC;  Surgeon: Harl Bowie, MD;  Location: [facility] OR;  Service: General;  Laterality: N/A;    Family History  Problem Relation Age of Onset  . Hypertension Other   . Stroke Other   . Heart disease Other     rheumatic   . Coronary artery disease Other     Allergies  Allergen Reactions  . Sulfonamide Derivatives Other (See Comments)    Reaction unknown    Current Outpatient Prescriptions on File Prior to Visit  Medication Sig Dispense Refill  . amLODipine (NORVASC) 5 MG tablet Take 1 tablet (5 mg total) by mouth  daily. 90 tablet 3  . buPROPion (WELLBUTRIN XL) 300 MG 24 hr tablet Take 1 tablet (300 mg total) by mouth daily. 30 tablet 5  . LORazepam (ATIVAN) 1 MG tablet Take 1 tablet (1 mg total) by mouth every 8 (eight) hours as needed for anxiety. 60 tablet 5  . losartan-hydrochlorothiazide (HYZAAR) 100-25 MG per tablet Take 1 tablet by mouth daily. 90 tablet 3  . tamsulosin (FLOMAX) 0.4 MG CAPS capsule Take 1 capsule (0.4 mg total) by mouth daily. 90 capsule 3  . temazepam (RESTORIL) 30 MG capsule TAKE ONE CAPSULE BY MOUTH AT BEDTIME AS NEEDED 30 capsule 5   No current facility-administered medications on file prior to visit.    BP 146/90 mmHg  Pulse 115  Temp(Src) 99.1 F (37.3 C) (Oral)  Resp 20  Ht 5' 11.5" (1.816 m)  Wt 218 lb (98.884 kg)  BMI 29.98 kg/m2  SpO2 98%     Review of Systems  Constitutional: Negative for fever, chills, appetite change and fatigue.  HENT: Negative for congestion, dental problem, ear pain, hearing loss, sore throat, tinnitus, trouble swallowing and voice change.   Eyes: Negative for pain, discharge and visual disturbance.  Respiratory: Negative for cough, chest tightness, wheezing and stridor.   Cardiovascular: Negative for chest pain, palpitations and leg swelling.  Gastrointestinal: Negative for nausea, vomiting, abdominal pain, diarrhea, constipation, blood in stool and abdominal distention.  Genitourinary: Negative for urgency, hematuria, flank pain, discharge, difficulty urinating and genital sores.  Musculoskeletal: Negative for myalgias, back pain, joint swelling, arthralgias, gait problem and neck stiffness.  Skin: Positive for wound. Negative for rash.  Neurological: Negative for dizziness, syncope, speech difficulty, weakness, numbness and headaches.  Hematological: Negative for adenopathy. Does not bruise/bleed easily.  Psychiatric/Behavioral: Negative for behavioral problems and dysphoric mood. The patient is not nervous/anxious.          Objective:   Physical Exam  Constitutional: He appears well-developed and well-nourished. No distress.  Repeat pulse 100  Skin:  8.  Sutures removed from scattered lacerations in the right supraorbital area          Assessment & Plan:   Suture removal.  Local wound care discussed Essential hypertension

## 2015-10-07 ENCOUNTER — Telehealth: Payer: Self-pay | Admitting: Family Medicine

## 2015-10-07 MED ORDER — DOXEPIN HCL 6 MG PO TABS: 6.0000 mg | ORAL_TABLET | Freq: Every day | ORAL | Status: DC

## 2015-10-07 NOTE — Telephone Encounter (Signed)
Calvin [name] the Temazepam and call in Silenor (Doxepin) 6 mg qhs, #30 with 5 rf

## 2015-10-07 NOTE — Telephone Encounter (Signed)
Pt has been denied for temazepam (non formulary) and provider need to try silenor which is covered on the pt insurance.

## 2015-10-07 NOTE — Telephone Encounter (Signed)
I sent script e-scribe to [facility] and spoke with pt, also updated medication list.

## 2015-10-07 NOTE — Telephone Encounter (Signed)
Please advise 

## 2015-11-04 ENCOUNTER — Other Ambulatory Visit: Payer: Self-pay | Admitting: Family Medicine

## 2015-11-06 ENCOUNTER — Other Ambulatory Visit: Payer: Self-pay | Admitting: Family Medicine

## 2016-01-03 ENCOUNTER — Other Ambulatory Visit: Payer: Self-pay | Admitting: Family Medicine

## 2016-01-09 ENCOUNTER — Ambulatory Visit (INDEPENDENT_AMBULATORY_CARE_PROVIDER_SITE_OTHER): Payer: Medicare Other | Admitting: Family Medicine

## 2016-01-09 ENCOUNTER — Encounter: Payer: Self-pay | Admitting: Family Medicine

## 2016-01-09 VITALS — BP 135/89 | HR 91 | Temp 98.5°F | Ht 71.5 in | Wt 231.0 lb

## 2016-01-09 DIAGNOSIS — R531 Weakness: Secondary | ICD-10-CM | POA: Diagnosis not present

## 2016-01-09 DIAGNOSIS — Z0000 Encounter for general adult medical examination without abnormal findings: Secondary | ICD-10-CM

## 2016-01-09 DIAGNOSIS — Z0001 Encounter for general adult medical examination with abnormal findings: Secondary | ICD-10-CM | POA: Diagnosis not present

## 2016-01-09 DIAGNOSIS — E291 Testicular hypofunction: Secondary | ICD-10-CM

## 2016-01-09 LAB — HEPATIC FUNCTION PANEL
ALBUMIN: 4.7 g/dL (ref 3.5–5.2)
ALT: 91 U/L — ABNORMAL HIGH (ref 0–53)
AST: 46 U/L — ABNORMAL HIGH (ref 0–37)
Alkaline Phosphatase: 54 U/L (ref 39–117)
BILIRUBIN TOTAL: 0.8 mg/dL (ref 0.2–1.2)
Bilirubin, Direct: 0.2 mg/dL (ref 0.0–0.3)
Total Protein: 7.4 g/dL (ref 6.0–8.3)

## 2016-01-09 LAB — CBC WITH DIFFERENTIAL/PLATELET
BASOS ABS: 0 10*3/uL (ref 0.0–0.1)
Basophils Relative: 0.4 % (ref 0.0–3.0)
EOS ABS: 0.2 10*3/uL (ref 0.0–0.7)
Eosinophils Relative: 2.9 % (ref 0.0–5.0)
HCT: 45.7 % (ref 39.0–52.0)
Hemoglobin: 16 g/dL (ref 13.0–17.0)
LYMPHS ABS: 2 10*3/uL (ref 0.7–4.0)
LYMPHS PCT: 34.1 % (ref 12.0–46.0)
MCHC: 35 g/dL (ref 30.0–36.0)
MCV: 101 fl — AB (ref 78.0–100.0)
MONOS PCT: 11.8 % (ref 3.0–12.0)
Monocytes Absolute: 0.7 10*3/uL (ref 0.1–1.0)
NEUTROS ABS: 3 10*3/uL (ref 1.4–7.7)
Neutrophils Relative %: 50.8 % (ref 43.0–77.0)
PLATELETS: 262 10*3/uL (ref 150.0–400.0)
RBC: 4.53 Mil/uL (ref 4.22–5.81)
RDW: 13.5 % (ref 11.5–15.5)
WBC: 5.9 10*3/uL (ref 4.0–10.5)

## 2016-01-09 LAB — POC URINALSYSI DIPSTICK (AUTOMATED)
Bilirubin, UA: NEGATIVE
Glucose, UA: NEGATIVE
KETONES UA: NEGATIVE
NITRITE UA: POSITIVE
RBC UA: NEGATIVE
Spec Grav, UA: 1.025
Urobilinogen, UA: 1
pH, UA: 5

## 2016-01-09 LAB — BASIC METABOLIC PANEL
BUN: 14 mg/dL (ref 6–23)
CHLORIDE: 104 meq/L (ref 96–112)
CO2: 25 meq/L (ref 19–32)
Calcium: 9.7 mg/dL (ref 8.4–10.5)
Creatinine, Ser: 0.81 mg/dL (ref 0.40–1.50)
GFR: 100.57 mL/min (ref >60.00)
Glucose, Bld: 92 mg/dL (ref 70–99)
POTASSIUM: 3.6 meq/L (ref 3.5–5.1)
SODIUM: 141 meq/L (ref 135–145)

## 2016-01-09 LAB — LIPID PANEL
CHOLESTEROL: 222 mg/dL — AB (ref 0–200)
HDL: 60.9 mg/dL (ref >39.00)
LDL Cholesterol: 130 mg/dL — ABNORMAL HIGH (ref 0–99)
NonHDL: 161.49
TRIGLYCERIDES: 159 mg/dL — AB (ref 0.0–149.0)
Total CHOL/HDL Ratio: 4
VLDL: 31.8 mg/dL (ref 0.0–40.0)

## 2016-01-09 LAB — TSH: TSH: 3.18 u[IU]/mL (ref 0.35–4.50)

## 2016-01-09 LAB — PSA: PSA: 2.77 ng/mL (ref 0.10–4.00)

## 2016-01-09 LAB — VITAMIN B12: Vitamin B-12: 266 pg/mL (ref 211–911)

## 2016-01-09 LAB — TESTOSTERONE: Testosterone: 338.6 ng/dL (ref 300.00–890.00)

## 2016-01-09 MED ORDER — LOSARTAN POTASSIUM-HCTZ 100-25 MG PO TABS: ORAL_TABLET | ORAL | 3 refills | Status: DC

## 2016-01-09 MED ORDER — LORAZEPAM 1 MG PO TABS: 1.0000 mg | ORAL_TABLET | Freq: Three times a day (TID) | ORAL | 5 refills | Status: DC | PRN

## 2016-01-09 MED ORDER — TAMSULOSIN HCL 0.4 MG PO CAPS: 0.4000 mg | ORAL_CAPSULE | Freq: Every day | ORAL | 3 refills | Status: DC

## 2016-01-09 MED ORDER — DOXEPIN HCL 6 MG PO TABS: 6.0000 mg | ORAL_TABLET | Freq: Every day | ORAL | 5 refills | Status: DC

## 2016-01-09 NOTE — Progress Notes (Signed)
   Subjective:    Patient ID: Calvin [name], male    DOB: [birth year], 70 y.o.   [name]: [id number]  HPI 70 yr old male for a well exam. His main complaint today is fatigue. He knows he is overweight and he gest very little exercise. His job involves sitting at a desk mostly. He stopped taking Wellbutrin some months ago and he says his depression is under control now. He also stopped taking Amlodipine because his ankles swell a bit, however his BP seems to have remained stable.    Review of Systems  Constitutional: Negative.   HENT: Negative.   Eyes: Negative.   Respiratory: Negative.   Cardiovascular: Negative.   Gastrointestinal: Negative.   Genitourinary: Negative.   Musculoskeletal: Negative.   Skin: Negative.   Neurological: Negative.   Psychiatric/Behavioral: Negative.        Objective:   Physical Exam  Constitutional: He is oriented to person, place, and time. No distress.  Obese   HENT:  Head: Normocephalic and atraumatic.  Right Ear: External ear normal.  Left Ear: External ear normal.  Nose: Nose normal.  Mouth/Throat: Oropharynx is clear and moist. No oropharyngeal exudate.  Eyes: Conjunctivae and EOM are normal. Pupils are equal, round, and reactive to light. Right eye exhibits no discharge. Left eye exhibits no discharge. No scleral icterus.  Neck: Neck supple. No JVD present. No tracheal deviation present. No thyromegaly present.  Cardiovascular: Normal rate, regular rhythm, normal heart sounds and intact distal pulses.  Exam reveals no gallop and no friction rub.   No murmur heard. Pulmonary/Chest: Effort normal and breath sounds normal. No respiratory distress. He has no wheezes. He has no rales. He exhibits no tenderness.  Abdominal: Soft. Bowel sounds are normal. He exhibits no distension and no mass. There is no tenderness. There is no rebound and no guarding.  Genitourinary: Rectum normal, prostate normal and penis normal. Rectal exam shows guaiac negative  stool. No penile tenderness.  Musculoskeletal: Normal range of motion. He exhibits edema. He exhibits no tenderness.  2+ edema in both ankles   Lymphadenopathy:    He has no cervical adenopathy.  Neurological: He is alert and oriented to person, place, and time. He has normal reflexes. No cranial nerve deficit. He exhibits normal muscle tone. Coordination normal.  Skin: Skin is warm and dry. No rash noted. He is not diaphoretic. No erythema. No pallor.  Psychiatric: He has a normal mood and affect. His behavior is normal. Judgment and thought content normal.          Assessment & Plan:  Well exam. We discussed diet and exercise. Get fasting labs today.  Laurey Morale, MD

## 2016-01-09 NOTE — Progress Notes (Signed)
Pre visit review using our clinic review tool, if applicable. No additional management support is needed unless otherwise documented below in the visit note. 

## 2016-01-11 ENCOUNTER — Telehealth: Payer: Self-pay | Admitting: Family Medicine

## 2016-01-11 NOTE — Telephone Encounter (Signed)
Pt states his insurance will cover gel, (androgel) patch and an injection. All 3 methods are acceptable.   RX Instructions must state this is for low testosterone and not ED.

## 2016-01-12 NOTE — Telephone Encounter (Signed)
Call in Androgel 1.62% to apply 4 pumps daily, 90 days with one rf

## 2016-01-13 LAB — URINE CULTURE

## 2016-01-13 MED ORDER — TESTOSTERONE 20.25 MG/1.25GM (1.62%) TD GEL: TRANSDERMAL | 1 refills | Status: DC

## 2016-01-13 NOTE — Telephone Encounter (Signed)
I printed script and faxed, also spoke with pt.

## 2016-01-16 MED ORDER — CIPROFLOXACIN HCL 500 MG PO TABS: 500.0000 mg | ORAL_TABLET | Freq: Two times a day (BID) | ORAL | 0 refills | Status: DC

## 2016-01-16 NOTE — Addendum Note (Signed)
Addended by: Aggie Hacker A on: 01/16/2016 05:22 PM   Modules accepted: Orders

## 2016-01-18 ENCOUNTER — Telehealth: Payer: Self-pay | Admitting: Family Medicine

## 2016-01-18 NOTE — Telephone Encounter (Signed)
PA received from [facility]. PA submitted & is pending. Key: [id number]

## 2016-01-19 ENCOUNTER — Encounter (HOSPITAL_COMMUNITY): Payer: Self-pay | Admitting: Neurology

## 2016-01-19 ENCOUNTER — Observation Stay (HOSPITAL_COMMUNITY): Payer: Medicare Other

## 2016-01-19 ENCOUNTER — Inpatient Hospital Stay (HOSPITAL_COMMUNITY)
Admission: EM | Admit: 2016-01-19 | Discharge: 2016-01-21 | DRG: 728 | Disposition: A | Discharge status: Home / Self Care | Payer: Medicare Other | Attending: Internal Medicine | Admitting: Internal Medicine

## 2016-01-19 ENCOUNTER — Emergency Department (HOSPITAL_COMMUNITY): Payer: Medicare Other

## 2016-01-19 DIAGNOSIS — F329 Major depressive disorder, single episode, unspecified: Secondary | ICD-10-CM | POA: Diagnosis present

## 2016-01-19 DIAGNOSIS — F419 Anxiety disorder, unspecified: Secondary | ICD-10-CM | POA: Diagnosis present

## 2016-01-19 DIAGNOSIS — N50811 Right testicular pain: Secondary | ICD-10-CM | POA: Diagnosis not present

## 2016-01-19 DIAGNOSIS — I10 Essential (primary) hypertension: Secondary | ICD-10-CM | POA: Diagnosis not present

## 2016-01-19 DIAGNOSIS — Z823 Family history of stroke: Secondary | ICD-10-CM

## 2016-01-19 DIAGNOSIS — R0902 Hypoxemia: Secondary | ICD-10-CM

## 2016-01-19 DIAGNOSIS — N400 Enlarged prostate without lower urinary tract symptoms: Secondary | ICD-10-CM | POA: Diagnosis not present

## 2016-01-19 DIAGNOSIS — M7989 Other specified soft tissue disorders: Secondary | ICD-10-CM

## 2016-01-19 DIAGNOSIS — B9620 Unspecified Escherichia coli [E. coli] as the cause of diseases classified elsewhere: Secondary | ICD-10-CM | POA: Diagnosis present

## 2016-01-19 DIAGNOSIS — N451 Epididymitis: Secondary | ICD-10-CM | POA: Diagnosis present

## 2016-01-19 DIAGNOSIS — Z8249 Family history of ischemic heart disease and other diseases of the circulatory system: Secondary | ICD-10-CM

## 2016-01-19 DIAGNOSIS — E291 Testicular hypofunction: Secondary | ICD-10-CM | POA: Diagnosis present

## 2016-01-19 DIAGNOSIS — N453 Epididymo-orchitis: Secondary | ICD-10-CM

## 2016-01-19 DIAGNOSIS — Z87442 Personal history of urinary calculi: Secondary | ICD-10-CM

## 2016-01-19 DIAGNOSIS — F32A Depression, unspecified: Secondary | ICD-10-CM | POA: Diagnosis present

## 2016-01-19 DIAGNOSIS — N390 Urinary tract infection, site not specified: Secondary | ICD-10-CM | POA: Diagnosis present

## 2016-01-19 DIAGNOSIS — A419 Sepsis, unspecified organism: Secondary | ICD-10-CM

## 2016-01-19 LAB — CBC WITH DIFFERENTIAL/PLATELET
BASOS ABS: 0 10*3/uL (ref 0.0–0.1)
Basophils Relative: 0 %
EOS PCT: 0 %
Eosinophils Absolute: 0 10*3/uL (ref 0.0–0.7)
HCT: 44 % (ref 39.0–52.0)
Hemoglobin: 15.9 g/dL (ref 13.0–17.0)
LYMPHS PCT: 6 %
Lymphs Abs: 0.8 10*3/uL (ref 0.7–4.0)
MCH: 35.8 pg — AB (ref 26.0–34.0)
MCHC: 36.1 g/dL — AB (ref 30.0–36.0)
MCV: 99.1 fL (ref 78.0–100.0)
MONO ABS: 0.8 10*3/uL (ref 0.1–1.0)
MONOS PCT: 6 %
Neutro Abs: 11.4 10*3/uL — ABNORMAL HIGH (ref 1.7–7.7)
Neutrophils Relative %: 88 %
PLATELETS: 223 10*3/uL (ref 150–400)
RBC: 4.44 MIL/uL (ref 4.22–5.81)
RDW: 12.6 % (ref 11.5–15.5)
WBC: 13.1 10*3/uL — ABNORMAL HIGH (ref 4.0–10.5)

## 2016-01-19 LAB — I-STAT CG4 LACTIC ACID, ED
Lactic Acid, Venous: 2.68 mmol/L (ref 0.5–1.9)
Lactic Acid, Venous: 2.48 mmol/L (ref 0.5–1.9)

## 2016-01-19 LAB — COMPREHENSIVE METABOLIC PANEL
ALK PHOS: 56 U/L (ref 38–126)
ALT: 109 U/L — ABNORMAL HIGH (ref 17–63)
AST: 53 U/L — AB (ref 15–41)
Albumin: 4.1 g/dL (ref 3.5–5.0)
Anion gap: 11 (ref 5–15)
BUN: 19 mg/dL (ref 6–20)
CALCIUM: 9.3 mg/dL (ref 8.9–10.3)
CO2: 22 mmol/L (ref 22–32)
CREATININE: 1.04 mg/dL (ref 0.61–1.24)
Chloride: 102 mmol/L (ref 101–111)
GFR calc Af Amer: >60 mL/min (ref >60)
GFR calc non Af Amer: >60 mL/min (ref >60)
GLUCOSE: 167 mg/dL — AB (ref 65–99)
Potassium: 3.5 mmol/L (ref 3.5–5.1)
Sodium: 135 mmol/L (ref 135–145)
Total Bilirubin: 1.3 mg/dL — ABNORMAL HIGH (ref 0.3–1.2)
Total Protein: 6.9 g/dL (ref 6.5–8.1)

## 2016-01-19 LAB — GLUCOSE, CAPILLARY: GLUCOSE-CAPILLARY: 125 mg/dL — AB (ref 65–99)

## 2016-01-19 MED ORDER — HYDRALAZINE HCL 20 MG/ML IJ SOLN
5.0000 mg | INTRAMUSCULAR | Status: DC | PRN
  Administered 2016-01-19: 5 mg via INTRAVENOUS

## 2016-01-19 MED ORDER — HYDROMORPHONE HCL 1 MG/ML IJ SOLN
1.0000 mg | Freq: Once | INTRAMUSCULAR | Status: AC
  Administered 2016-01-19: 1 mg via INTRAVENOUS
  Filled 2016-01-19: qty 1

## 2016-01-19 MED ORDER — KETOROLAC TROMETHAMINE 15 MG/ML IJ SOLN
15.0000 mg | Freq: Once | INTRAMUSCULAR | Status: AC
  Administered 2016-01-19: 15 mg via INTRAVENOUS
  Filled 2016-01-19: qty 1

## 2016-01-19 MED ORDER — FOLIC ACID 1 MG PO TABS
1.0000 mg | ORAL_TABLET | Freq: Every day | ORAL | Status: DC
  Administered 2016-01-19 – 2016-01-21 (×3): 1 mg via ORAL
  Filled 2016-01-19 (×3): qty 1

## 2016-01-19 MED ORDER — ONDANSETRON HCL 4 MG/2ML IJ SOLN: 4.0000 mg | Freq: Four times a day (QID) | INTRAMUSCULAR | Status: DC | PRN

## 2016-01-19 MED ORDER — HYDROCHLOROTHIAZIDE 25 MG PO TABS
25.0000 mg | ORAL_TABLET | Freq: Every day | ORAL | Status: DC
  Administered 2016-01-20 – 2016-01-21 (×2): 25 mg via ORAL
  Filled 2016-01-19 (×2): qty 1

## 2016-01-19 MED ORDER — DOXEPIN HCL 10 MG/ML PO CONC
6.0000 mg | Freq: Every day | ORAL | Status: DC
  Administered 2016-01-19 – 2016-01-20 (×2): 6 mg via ORAL
  Filled 2016-01-19 (×3): qty 0.6

## 2016-01-19 MED ORDER — ONDANSETRON HCL 4 MG/2ML IJ SOLN
4.0000 mg | Freq: Once | INTRAMUSCULAR | Status: AC
  Administered 2016-01-19: 4 mg via INTRAVENOUS
  Filled 2016-01-19: qty 2

## 2016-01-19 MED ORDER — SODIUM CHLORIDE 0.9 % IV SOLN
INTRAVENOUS | Status: DC
  Administered 2016-01-19: 17:00:00 via INTRAVENOUS
  Administered 2016-01-20: 100 mL via INTRAVENOUS
  Administered 2016-01-20: 1000 mL via INTRAVENOUS
  Administered 2016-01-21: via INTRAVENOUS

## 2016-01-19 MED ORDER — LOSARTAN POTASSIUM 50 MG PO TABS
100.0000 mg | ORAL_TABLET | Freq: Every day | ORAL | Status: DC
  Administered 2016-01-20 – 2016-01-21 (×2): 100 mg via ORAL
  Filled 2016-01-19 (×2): qty 2

## 2016-01-19 MED ORDER — SODIUM CHLORIDE 0.9 % IV BOLUS (SEPSIS)
1000.0000 mL | Freq: Once | INTRAVENOUS | Status: AC
Start: 2016-01-19 — End: 2016-01-19
  Administered 2016-01-19: 1000 mL via INTRAVENOUS

## 2016-01-19 MED ORDER — THIAMINE HCL 100 MG/ML IJ SOLN
100.0000 mg | Freq: Every day | INTRAMUSCULAR | Status: DC
  Filled 2016-01-19: qty 2

## 2016-01-19 MED ORDER — LORAZEPAM 1 MG PO TABS: 1.0000 mg | ORAL_TABLET | Freq: Three times a day (TID) | ORAL | Status: DC | PRN

## 2016-01-19 MED ORDER — TAMSULOSIN HCL 0.4 MG PO CAPS
0.4000 mg | ORAL_CAPSULE | Freq: Every day | ORAL | Status: DC
  Administered 2016-01-19 – 2016-01-20 (×2): 0.4 mg via ORAL
  Filled 2016-01-19 (×3): qty 1

## 2016-01-19 MED ORDER — DOXEPIN HCL 6 MG PO TABS: 6.0000 mg | ORAL_TABLET | Freq: Every day | ORAL | Status: DC

## 2016-01-19 MED ORDER — LORAZEPAM 1 MG PO TABS: 1.0000 mg | ORAL_TABLET | Freq: Four times a day (QID) | ORAL | Status: DC | PRN

## 2016-01-19 MED ORDER — IOPAMIDOL (ISOVUE-300) INJECTION 61%
INTRAVENOUS | Status: AC
  Administered 2016-01-19: 100 mL
  Filled 2016-01-19: qty 100

## 2016-01-19 MED ORDER — SODIUM CHLORIDE 0.9 % IV BOLUS (SEPSIS)
1000.0000 mL | Freq: Once | INTRAVENOUS | Status: AC
  Administered 2016-01-19: 1000 mL via INTRAVENOUS

## 2016-01-19 MED ORDER — OXYCODONE HCL 5 MG PO TABS
5.0000 mg | ORAL_TABLET | ORAL | Status: DC | PRN
  Administered 2016-01-20 – 2016-01-21 (×3): 5 mg via ORAL
  Filled 2016-01-19 (×3): qty 1

## 2016-01-19 MED ORDER — VITAMIN B-1 100 MG PO TABS
100.0000 mg | ORAL_TABLET | Freq: Every day | ORAL | Status: DC
  Administered 2016-01-19 – 2016-01-21 (×3): 100 mg via ORAL
  Filled 2016-01-19 (×3): qty 1

## 2016-01-19 MED ORDER — LEVOFLOXACIN IN D5W 500 MG/100ML IV SOLN
500.0000 mg | Freq: Once | INTRAVENOUS | Status: AC
  Administered 2016-01-19: 500 mg via INTRAVENOUS
  Filled 2016-01-19: qty 100

## 2016-01-19 MED ORDER — LORAZEPAM 1 MG PO TABS: 0.0000 mg | ORAL_TABLET | Freq: Two times a day (BID) | ORAL | Status: DC

## 2016-01-19 MED ORDER — ADULT MULTIVITAMIN W/MINERALS CH
1.0000 | ORAL_TABLET | Freq: Every day | ORAL | Status: DC
  Administered 2016-01-19 – 2016-01-21 (×3): 1 via ORAL
  Filled 2016-01-19 (×3): qty 1

## 2016-01-19 MED ORDER — ENOXAPARIN SODIUM 40 MG/0.4ML ~~LOC~~ SOLN
40.0000 mg | SUBCUTANEOUS | Status: DC
  Administered 2016-01-19 – 2016-01-20 (×2): 40 mg via SUBCUTANEOUS
  Filled 2016-01-19 (×2): qty 0.4

## 2016-01-19 MED ORDER — LOSARTAN POTASSIUM-HCTZ 100-25 MG PO TABS: 1.0000 | ORAL_TABLET | Freq: Every day | ORAL | Status: DC

## 2016-01-19 MED ORDER — HYDRALAZINE HCL 20 MG/ML IJ SOLN
INTRAMUSCULAR | Status: AC
  Administered 2016-01-19: 5 mg via INTRAVENOUS
  Filled 2016-01-19: qty 1

## 2016-01-19 MED ORDER — ONDANSETRON HCL 4 MG PO TABS: 4.0000 mg | ORAL_TABLET | Freq: Four times a day (QID) | ORAL | Status: DC | PRN

## 2016-01-19 MED ORDER — LORAZEPAM 2 MG/ML IJ SOLN: 1.0000 mg | Freq: Four times a day (QID) | INTRAMUSCULAR | Status: DC | PRN

## 2016-01-19 MED ORDER — LORAZEPAM 1 MG PO TABS
0.0000 mg | ORAL_TABLET | Freq: Four times a day (QID) | ORAL | Status: DC
  Administered 2016-01-19: 1 mg via ORAL
  Filled 2016-01-19: qty 1

## 2016-01-19 MED ORDER — KETOROLAC TROMETHAMINE 15 MG/ML IJ SOLN
15.0000 mg | Freq: Three times a day (TID) | INTRAMUSCULAR | Status: DC | PRN
  Administered 2016-01-20: 15 mg via INTRAVENOUS
  Filled 2016-01-19: qty 1

## 2016-01-19 MED ORDER — LEVOFLOXACIN IN D5W 500 MG/100ML IV SOLN
500.0000 mg | INTRAVENOUS | Status: DC
  Administered 2016-01-20 – 2016-01-21 (×2): 500 mg via INTRAVENOUS
  Filled 2016-01-19 (×3): qty 100

## 2016-01-19 NOTE — ED Notes (Signed)
Patient was given a cup of ginger ale.

## 2016-01-19 NOTE — ED Notes (Signed)
Patient transported to X-ray 

## 2016-01-19 NOTE — H&P (Signed)
History and Physical    Calvin [name] [id number] DOB: May 26, 1946 DOA: 01/19/2016  PCP: Laurey Morale, MD Patient coming from: Home   Chief Complaint: Scrotal swelling and pain  HPI: Calvin [name] is a 70 y.o. male with medical history significant of BPH, HTN, headaches, nephrolithiasis, presenting with 24 hours of worsening constant testicular pain and significant swelling in the scrotal region. Patient states he was evaluated on 01/09/2016 for possible UTI and was told he had a small infection would need to start antibiotics. However antibiotics were never prescribed. Denies any dysuria, hematuria, penile discharge, penile lesions, fevers, abdominal pain, coronary adenopathy, nausea, vomiting, diarrhea, neck stiffness, headache.   ED Course: IV pain meds given. Urology consult. IV fluids given. Antibiotics started. Testicular torsion ruled out.  Review of Systems: As per HPI otherwise 10 point review of systems negative.   Ambulatory Status: No restrictions  Past Medical History:  Diagnosis Date  . Benign prostatic hypertrophy   . ED (erectile dysfunction)   . Headache(784.0)   . Hypertension   . Hypogonadism male   . Nephrolithiasis    hx of had lithotripsy in 1-10.    Past Surgical History:  Procedure Laterality Date  . colonoscopy  03-28-10   per Dr. Fuller Plan, hyperplastic polyps, repeat in 10 yrs   . LAPAROSCOPIC APPENDECTOMY  03/12/2012   Procedure: APPENDECTOMY LAPAROSCOPIC;  Surgeon: Harl Bowie, MD;  Location: [place];  Service: General;  Laterality: N/A;  . Levert Feinstein HEMORRHOIDECTOMY  04-14-10   per Dr. Johney Maine   . TONSILLECTOMY      Social History   Social History  . Marital status: Widowed    Spouse name: N/A  . Number of children: N/A  . Years of education: N/A   Occupational History  . Not on file.   Social History Main Topics  . Smoking status: Never Smoker  . Smokeless tobacco: Never Used  . Alcohol use 0.0 oz/week     Comment: couple of drinks  or more per day  . Drug use: No  . Sexual activity: Not on file   Other Topics Concern  . Not on file   Social History Narrative  . No narrative on file    No Known Allergies  Family History  Problem Relation Age of Onset  . Hypertension Other   . Stroke Other   . Heart disease Other     rheumatic   . Coronary artery disease Other     Prior to Admission medications   Medication Sig Start Date End Date Taking? Authorizing Provider  Doxepin HCl 6 MG TABS Take 1 tablet (6 mg total) by mouth at bedtime. 01/09/16  Yes Laurey Morale, MD  doxycycline (VIBRAMYCIN) 50 MG capsule Take 50 mg by mouth daily.   Yes Historical Provider, MD  LORazepam (ATIVAN) 1 MG tablet Take 1 tablet (1 mg total) by mouth every 8 (eight) hours as needed for anxiety. 01/09/16  Yes Laurey Morale, MD  losartan-hydrochlorothiazide (HYZAAR) 100-25 MG tablet TAKE 1 TPO EVERY DAY Patient taking differently: Take 1 tablet by mouth daily.  01/09/16  Yes Laurey Morale, MD  tamsulosin (FLOMAX) 0.4 MG CAPS capsule Take 1 capsule (0.4 mg total) by mouth daily. 01/09/16  Yes Laurey Morale, MD  ciprofloxacin (CIPRO) 500 MG tablet Take 1 tablet (500 mg total) by mouth 2 (two) times daily. 01/16/16   Laurey Morale, MD  sildenafil (VIAGRA) 100 MG tablet Take 1 tablet (100 mg total) by mouth as  needed for erectile dysfunction. 09/26/15   Marletta Lor, MD  Testosterone (ANDROGEL) 20.25 MG/1.25GM (1.62%) GEL Apply 4 pumps once a day to skin 01/13/16   Laurey Morale, MD    Physical Exam: Vitals:   01/19/16 0926 01/19/16 1045 01/19/16 1055 01/19/16 1145  BP:  117/82  120/86  Pulse: 102 103  113  Resp:      Temp:      TempSrc:      SpO2: 96% (!) 89% 94% 90%     General:  Appears calm and comfortable Eyes:  PERRL, EOMI, normal lids, iris ENT:  grossly normal hearing, lips & tongue, mmm Neck:  no LAD, masses or thyromegaly Cardiovascular:  RRR, no m/r/g. No LE edema.  Respiratory:  CTA bilaterally, no w/r/r. Normal  respiratory effort. Abdomen:  soft, ntnd, NABS GU: Scrotal swelling with right significantly greater than left. Significant tenderness throughout. No groin adenopathy felt. No penile lesions. The penile discharge. Skin:  no rash or induration seen on limited exam Musculoskeletal:  grossly normal tone BUE/BLE, good ROM, no bony abnormality Psychiatric:  grossly normal mood and affect, speech fluent and appropriate, AOx3 Neurologic:  CN 2-12 grossly intact, moves all extremities in coordinated fashion, sensation intact  Labs on Admission: I have personally reviewed following labs and imaging studies  CBC:  Recent Labs Lab 01/19/16 0845  WBC 13.1*  NEUTROABS 11.4*  HGB 15.9  HCT 44.0  MCV 99.1  PLT [id number]   Basic Metabolic Panel:  Recent Labs Lab 01/19/16 0845  NA 135  K 3.5  CL 102  CO2 22  GLUCOSE 167*  BUN 19  CREATININE 1.04  CALCIUM 9.3   GFR: Estimated Creatinine Clearance: 84.4 mL/min (by C-G formula based on SCr of 1.04 mg/dL). Liver Function Tests:  Recent Labs Lab 01/19/16 0845  AST 53*  ALT 109*  ALKPHOS 56  BILITOT 1.3*  PROT 6.9  ALBUMIN 4.1   No results for input(s): LIPASE, AMYLASE in the last 168 hours. No results for input(s): AMMONIA in the last 168 hours. Coagulation Profile: No results for input(s): INR, PROTIME in the last 168 hours. Cardiac Enzymes: No results for input(s): CKTOTAL, CKMB, CKMBINDEX, TROPONINI in the last 168 hours. BNP (last 3 results) No results for input(s): PROBNP in the last 8760 hours. HbA1C: No results for input(s): HGBA1C in the last 72 hours. CBG: No results for input(s): GLUCAP in the last 168 hours. Lipid Profile: No results for input(s): CHOL, HDL, LDLCALC, TRIG, CHOLHDL, LDLDIRECT in the last 72 hours. Thyroid Function Tests: No results for input(s): TSH, T4TOTAL, FREET4, T3FREE, THYROIDAB in the last 72 hours. Anemia Panel: No results for input(s): VITAMINB12, FOLATE, FERRITIN, TIBC, IRON, RETICCTPCT in  the last 72 hours. Urine analysis:    Component Value Date/Time   COLORURINE AMBER (A) 03/12/2012 1250   APPEARANCEUR CLEAR 03/12/2012 1250   LABSPEC 1.025 03/12/2012 1250   PHURINE 6.0 03/12/2012 1250   GLUCOSEU NEGATIVE 03/12/2012 1250   HGBUR NEGATIVE 03/12/2012 1250   HGBUR negative 12/14/2009 0823   BILIRUBINUR n 01/09/2016 1236   KETONESUR 15 (A) 03/12/2012 1250   PROTEINUR 1+ 01/09/2016 1236   PROTEINUR NEGATIVE 03/12/2012 1250   UROBILINOGEN 1.0 01/09/2016 1236   UROBILINOGEN 1.0 03/12/2012 1250   NITRITE positive 01/09/2016 1236   NITRITE NEGATIVE 03/12/2012 1250   LEUKOCYTESUR small (1+) (A) 01/09/2016 1236    Creatinine Clearance: Estimated Creatinine Clearance: 84.4 mL/min (by C-G formula based on SCr of 1.04 mg/dL).  Sepsis Labs: @LABRCNTIP (procalcitonin:4,lacticidven:4) )  No results found for this or any previous visit (from the past 240 hour(s)).   Radiological Exams on Admission: US Scrotum  Result Date: 01/19/2016 CLINICAL DATA:  Right testicular pain/swelling x1 day EXAM: SCROTAL ULTRASOUND DOPPLER ULTRASOUND OF THE TESTICLES TECHNIQUE: Complete ultrasound examination of the testicles, epididymis, and other scrotal structures was performed. Color and spectral Doppler ultrasound were also utilized to evaluate blood flow to the testicles. COMPARISON:  None. FINDINGS: Right testicle Measurements: 5.1 x 4.0 x 3.6 cm. No mass or microlithiasis visualized. Hyperemia relative to the left. Left testicle Measurements: 5.4 x 2.7 x 3.9 cm. No mass or microlithiasis visualized. Right epididymis: Enlarged and hypervascular. 5 mm epididymal cyst. Left epididymis:  4 mm epididymal cyst. Hydrocele:  Moderate complex right hydrocele. Varicocele:  Present on the left. Pulsed Doppler interrogation of both testes demonstrates normal low resistance arterial and venous waveforms bilaterally. IMPRESSION: Suspected right epididymo-orchitis. Associated moderate complex right hydrocele. Left  varicocele. No evidence of testicular torsion. Electronically Signed   By: Julian Hy M.D.   On: 01/19/2016 10:37   [place] Art/ven Flow Abd Pelv Doppler  Result Date: 01/19/2016 CLINICAL DATA:  Right testicular pain/swelling x1 day EXAM: SCROTAL ULTRASOUND DOPPLER ULTRASOUND OF THE TESTICLES TECHNIQUE: Complete ultrasound examination of the testicles, epididymis, and other scrotal structures was performed. Color and spectral Doppler ultrasound were also utilized to evaluate blood flow to the testicles. COMPARISON:  None. FINDINGS: Right testicle Measurements: 5.1 x 4.0 x 3.6 cm. No mass or microlithiasis visualized. Hyperemia relative to the left. Left testicle Measurements: 5.4 x 2.7 x 3.9 cm. No mass or microlithiasis visualized. Right epididymis: Enlarged and hypervascular. 5 mm epididymal cyst. Left epididymis:  4 mm epididymal cyst. Hydrocele:  Moderate complex right hydrocele. Varicocele:  Present on the left. Pulsed Doppler interrogation of both testes demonstrates normal low resistance arterial and venous waveforms bilaterally. IMPRESSION: Suspected right epididymo-orchitis. Associated moderate complex right hydrocele. Left varicocele. No evidence of testicular torsion. Electronically Signed   By: Julian Hy M.D.   On: 01/19/2016 10:37   Dg Abdomen Acute W/chest  Result Date: 01/19/2016 CLINICAL DATA:  One day history of right lower quadrant pain EXAM: DG ABDOMEN ACUTE W/ 1V CHEST COMPARISON:  Chest radiograph March 12, 2012; CT abdomen and pelvis March 12, 2012 FINDINGS: PA chest: The degree of inspiration is shallow. No edema or consolidation. Heart is upper normal in size with pulmonary vascularity within normal limits. No adenopathy. Supine and upright abdomen: There is fairly diffuse stool throughout the colon. There is no bowel dilatation. There are multiple air-fluid levels on the right. No free air evident. There are phleboliths in the pelvis. IMPRESSION: Scattered air-fluid  levels on the right. No bowel dilatation. Question early ileus or enteritis. Bowel obstruction is not felt to be likely given this overall appearance. No free air. There is no parenchymal lung edema or consolidation. Electronically Signed   By: Lowella Grip III M.D.   On: 01/19/2016 09:27     Assessment/Plan Active Problems:   Essential hypertension   BPH (benign prostatic hyperplasia)   Depression   Epididymitis   Hypogonadism in male   Foot swelling   Epididymo-orchitis: R side. Lactic acid 2.5, WBC 13, afebrile, significant scrotal swelling with tenderness to palpation diffusely. Urology consult by EDP feels the patient can be treated conservatively with IV fluids and antibiotics. Urinalysis from 01/09/2016 showing nitrites and culture growing Escherichia coli with sensitivities noted. Patient was not started on antibiotics at that time due to what sounds  like a miscommunication. -Medical surgical bed, observation status - f/u CT pelvis - Levaquin (started after EDP discussed case with pharmacy) - IVF - Toradol, oxycodone, lidocaine - BCX, UCX  R foot swelling: constant for several months. No trauma.  - Doppler  Depression/anxiety: - continue doxepin, Ativan  HTN: - continue hyzaar  BPH: - continue flomax  Hypogonadism: - testosterone therapy per PCP (pt has not yet initiated therapy)   DVT prophylaxis: Lovenox  Code Status: FULL  amily Communication: none  Disposition Plan: pendingimproveemnt  Consults called: Urology - phone consult only  Admission status: med surge obs    Rolando Hessling J MD [facility]  If 7PM-7AM, please contact night-coverage [email or web address] Password [email or web address]  01/19/2016, 2:21 PM

## 2016-01-19 NOTE — ED Provider Notes (Signed)
[facility] DEPT Provider Note   CSN: [id number] Arrival date & time: 01/19/16  0814  First Provider Contact:  None    History   Chief Complaint Chief Complaint  Patient presents with  . Groin Pain    HPI Calvin [name] is a 70 y.o. male.  HPI 70 year old male with past medical history of BPH, hypogonadism, hypertension, and kidney stone who presents with a 24 hour history of right testicular pain. The patient states that he was in his usual state of health until yesterday afternoon, at which time he had acute onset of right testicular pain. He denies any sensation of twisting. He states that he was told that he may have a "small infection" in his testicle by his PCP last week, but he was not prescribed any antibiotics. Since the onset of pain yesterday, the pain has been a constant, severe, throbbing sensation, localized to his right testicle. The pain has not radiated. Denies any associated scrotal swelling or redness. Denies any dysuria or hematuria. Of note, earlier today he began vomiting but will use this is due to pain. Denies any constipation. He has been passing flatus this morning.  Past Medical History:  Diagnosis Date  . Benign prostatic hypertrophy   . ED (erectile dysfunction)   . Headache(784.0)   . Hypertension   . Hypogonadism male   . Nephrolithiasis    hx of had lithotripsy in 1-10.    Patient Active Problem List   Diagnosis Date Noted  . Epididymitis 01/19/2016  . Hypogonadism in male 01/19/2016  . Foot swelling 01/19/2016  . Testicular pain, right   . Depression 12/08/2014  . Insomnia 10/30/2013  . INTERNAL HEMORRHOIDS WITH OTHER COMPLICATION 0000000  . BPH (benign prostatic hyperplasia) 10/25/2008  . NEPHROLITHIASIS, HX OF 10/25/2008  . HEADACHE 09/15/2007  . Essential hypertension 07/07/2007    Past Surgical History:  Procedure Laterality Date  . colonoscopy  03-28-10   per Dr. Fuller Plan, hyperplastic polyps, repeat in 10 yrs   . LAPAROSCOPIC  APPENDECTOMY  03/12/2012   Procedure: APPENDECTOMY LAPAROSCOPIC;  Surgeon: Harl Bowie, MD;  Location: [facility];  Service: General;  Laterality: N/A;  . Levert Feinstein HEMORRHOIDECTOMY  04-14-10   per Dr. Johney Maine   . TONSILLECTOMY         Home Medications    Prior to Admission medications   Medication Sig Start Date End Date Taking? Authorizing Provider  Doxepin HCl 6 MG TABS Take 1 tablet (6 mg total) by mouth at bedtime. 01/09/16  Yes Laurey Morale, MD  doxycycline (VIBRAMYCIN) 50 MG capsule Take 50 mg by mouth daily.   Yes Historical Provider, MD  LORazepam (ATIVAN) 1 MG tablet Take 1 tablet (1 mg total) by mouth every 8 (eight) hours as needed for anxiety. 01/09/16  Yes Laurey Morale, MD  losartan-hydrochlorothiazide (HYZAAR) 100-25 MG tablet TAKE 1 TPO EVERY DAY Patient taking differently: Take 1 tablet by mouth daily.  01/09/16  Yes Laurey Morale, MD  tamsulosin (FLOMAX) 0.4 MG CAPS capsule Take 1 capsule (0.4 mg total) by mouth daily. 01/09/16  Yes Laurey Morale, MD  ciprofloxacin (CIPRO) 500 MG tablet Take 1 tablet (500 mg total) by mouth 2 (two) times daily. 01/16/16   Laurey Morale, MD  sildenafil (VIAGRA) 100 MG tablet Take 1 tablet (100 mg total) by mouth as needed for erectile dysfunction. 09/26/15   Marletta Lor, MD  Testosterone (ANDROGEL) 20.25 MG/1.25GM (1.62%) GEL Apply 4 pumps once a day to skin 01/13/16  Laurey Morale, MD    Family History Family History  Problem Relation Age of Onset  . Hypertension Other   . Stroke Other   . Heart disease Other     rheumatic   . Coronary artery disease Other     Social History Social History  Substance Use Topics  . Smoking status: Never Smoker  . Smokeless tobacco: Never Used  . Alcohol use 0.0 oz/week     Comment: couple of drinks or more per day     Allergies   Review of patient's allergies indicates no known allergies.   Review of Systems Review of Systems  Constitutional: Negative for chills, fatigue and fever.    HENT: Negative for congestion and rhinorrhea.   Eyes: Negative for visual disturbance.  Respiratory: Negative for cough, shortness of breath and wheezing.   Cardiovascular: Negative for chest pain and leg swelling.  Gastrointestinal: Positive for nausea and vomiting. Negative for abdominal pain and diarrhea.  Genitourinary: Positive for scrotal swelling and testicular pain. Negative for dysuria and flank pain.  Musculoskeletal: Negative for neck pain and neck stiffness.  Skin: Negative for rash and wound.  Allergic/Immunologic: Negative for immunocompromised state.  Neurological: Negative for syncope, weakness and headaches.     Physical Exam Updated Vital Signs BP (!) 130/105 (BP Location: Left Arm)   Pulse 100   Temp 98.7 F (37.1 C) (Oral)   Resp 19   Ht 6' (1.829 m)   Wt 233 lb (105.7 kg)   SpO2 98%   BMI 31.60 kg/m   Physical Exam  Constitutional: He appears well-developed and well-nourished. No distress.  HENT:  Head: Normocephalic.  Mouth/Throat: Oropharynx is clear and moist. No oropharyngeal exudate.  Eyes: Conjunctivae are normal. Pupils are equal, round, and reactive to light.  Neck: Neck supple.  Cardiovascular: Normal rate, regular rhythm, normal heart sounds and intact distal pulses.  Exam reveals no friction rub.   No murmur heard. Pulmonary/Chest: Effort normal. No respiratory distress. He has no wheezes. He has no rales.  Abdominal: Soft. Bowel sounds are normal. He exhibits no distension. There is no tenderness.  Genitourinary:  Genitourinary Comments: No penile shaft lesions. Testes descended b/l with intact Cremasterics. Right hemiscrotum markedly enlarged with inflamed, tender testicle and epididymis. No overlying skin changes. Swelling appears to arise from the testicle rather than inguinal area, with no apparent hernia.  Musculoskeletal: He exhibits no edema.  Neurological: He is alert. He exhibits normal muscle tone.  Skin: Skin is warm.  Nursing  note and vitals reviewed.    ED Treatments / Results  Labs (all labs ordered are listed, but only abnormal results are displayed) Labs Reviewed  CBC WITH DIFFERENTIAL/PLATELET - Abnormal; Notable for the following:       Result Value   WBC 13.1 (*)    MCH 35.8 (*)    MCHC 36.1 (*)    Neutro Abs 11.4 (*)    All other components within normal limits  COMPREHENSIVE METABOLIC PANEL - Abnormal; Notable for the following:    Glucose, Bld 167 (*)    AST 53 (*)    ALT 109 (*)    Total Bilirubin 1.3 (*)    All other components within normal limits  I-STAT CG4 LACTIC ACID, ED - Abnormal; Notable for the following:    Lactic Acid, Venous 2.68 (*)    All other components within normal limits  I-STAT CG4 LACTIC ACID, ED - Abnormal; Notable for the following:    Lactic Acid, Venous  2.48 (*)    All other components within normal limits  URINE CULTURE  CULTURE, BLOOD (ROUTINE X 2)  CULTURE, BLOOD (ROUTINE X 2)  CULTURE, BLOOD (ROUTINE X 2)  CULTURE, BLOOD (ROUTINE X 2)  URINE CULTURE  CBC  BASIC METABOLIC PANEL    EKG  EKG Interpretation None       Radiology Dg Chest 2 View  Result Date: 01/19/2016 CLINICAL DATA:  Hypoxemia. EXAM: CHEST  2 VIEW COMPARISON:  01/19/2016.  03/12/2012. FINDINGS: The heart is enlarged. The aorta is unfolded. There chronic interstitial lung markings in the lower lungs likely represent chronic scarring. Upper lungs are clear. No evidence heart failure or effusion. No significant bone finding. IMPRESSION: Cardiomegaly.  Unfolded aorta.  Chronic scarring in the lower lungs. Electronically Signed   By: Nelson Chimes M.D.   On: 01/19/2016 15:11   US Scrotum  Result Date: 01/19/2016 CLINICAL DATA:  Right testicular pain/swelling x1 day EXAM: SCROTAL ULTRASOUND DOPPLER ULTRASOUND OF THE TESTICLES TECHNIQUE: Complete ultrasound examination of the testicles, epididymis, and other scrotal structures was performed. Color and spectral Doppler ultrasound were also  utilized to evaluate blood flow to the testicles. COMPARISON:  None. FINDINGS: Right testicle Measurements: 5.1 x 4.0 x 3.6 cm. No mass or microlithiasis visualized. Hyperemia relative to the left. Left testicle Measurements: 5.4 x 2.7 x 3.9 cm. No mass or microlithiasis visualized. Right epididymis: Enlarged and hypervascular. 5 mm epididymal cyst. Left epididymis:  4 mm epididymal cyst. Hydrocele:  Moderate complex right hydrocele. Varicocele:  Present on the left. Pulsed Doppler interrogation of both testes demonstrates normal low resistance arterial and venous waveforms bilaterally. IMPRESSION: Suspected right epididymo-orchitis. Associated moderate complex right hydrocele. Left varicocele. No evidence of testicular torsion. Electronically Signed   By: Julian Hy M.D.   On: 01/19/2016 10:37   Ct Abdomen Pelvis W Contrast  Result Date: 01/19/2016 CLINICAL DATA:  Right scrotal pain infection for the past week. Nausea and vomiting this morning. Previous appendectomy. EXAM: CT ABDOMEN AND PELVIS WITH CONTRAST TECHNIQUE: Multidetector CT imaging of the abdomen and pelvis was performed using the standard protocol following bolus administration of intravenous contrast. CONTRAST:  147mL ISOVUE-300 IOPAMIDOL (ISOVUE-300) INJECTION 61% COMPARISON:  Scrotal ultrasound obtained earlier today. Abdomen and pelvis CT dated 03/12/2012 and 01/02/2008. FINDINGS: Lower chest: Linear scarring at both lung bases. Small probable subpleural lymph node along the major fissure on the left on image 8. 5 mm left lower lobe nodule on image number 23, unchanged since 01/02/2008. A small noncalcified pleural plaque on the right hemidiaphragm on image number 12 is significantly smaller than on 01/02/2008. Coronary artery calcifications are noted. Hepatobiliary: Multiple liver cysts are again demonstrated. Normal appearing gallbladder. Pancreas: No mass, inflammatory changes, or other significant abnormality. Spleen: Within normal  limits in size and appearance. Adrenals/Urinary Tract: Low density right adrenal mass measuring 2.4 x 1.8 cm on image number 35. This measured 1.5 x 1.3 cm normal 04/11/2012 and 1.3 x 1.1 cm on 01/02/2008. Normal appearing left adrenal gland. Left renal cysts are again demonstrated. Small bilateral parapelvic renal cysts are also noted. No urinary tract calculi, masses or hydronephrosis. Stomach/Bowel: Multiple colonic diverticula. Small sliding hiatal hernia. Unremarkable small bowel. The proximal portion of the appendix remains in place, with probable surgical sutures or staples at the distal aspect of the remaining portion of the appendix. Vascular/Lymphatic: Mild atheromatous arterial calcifications, including the abdominal aorta. No enlarged lymph nodes. Reproductive: Moderate to markedly enlarged and heterogeneous prostate gland, indenting into the base of  the urinary bladder, without change. The right testicle appears higher in density and more heterogeneous than the left testicle. Other: Small bilateral inguinal hernias containing fat. Small umbilical and supraumbilical hernias containing fat. Musculoskeletal: Lumbar and lower thoracic spine degenerative changes. Interval 20% T11 superior endplate compression deformity with no significant bony retropulsion and no acute fracture lines visualized. IMPRESSION: 1. Changes of right epididymo-orchitis described at ultrasound earlier today. 2. Slowly enlarging right adrenal probable adenoma. 3. Stable moderately to markedly enlarged and heterogeneous prostate gland. 4. Small bilateral inguinal, umbilical and supraumbilical hernias containing fat. 5. Aortic atherosclerosis coronary artery atherosclerosis. 6. Stable benign nodularity at the lung bases. 7. Interval old 20% T11 superior endplate compression deformity. 8. Small sliding hiatal hernia. 9. Colonic diverticulosis. Electronically Signed   By: Claudie Revering M.D.   On: 01/19/2016 15:12   [place] Art/ven Flow Abd  Pelv Doppler  Result Date: 01/19/2016 CLINICAL DATA:  Right testicular pain/swelling x1 day EXAM: SCROTAL ULTRASOUND DOPPLER ULTRASOUND OF THE TESTICLES TECHNIQUE: Complete ultrasound examination of the testicles, epididymis, and other scrotal structures was performed. Color and spectral Doppler ultrasound were also utilized to evaluate blood flow to the testicles. COMPARISON:  None. FINDINGS: Right testicle Measurements: 5.1 x 4.0 x 3.6 cm. No mass or microlithiasis visualized. Hyperemia relative to the left. Left testicle Measurements: 5.4 x 2.7 x 3.9 cm. No mass or microlithiasis visualized. Right epididymis: Enlarged and hypervascular. 5 mm epididymal cyst. Left epididymis:  4 mm epididymal cyst. Hydrocele:  Moderate complex right hydrocele. Varicocele:  Present on the left. Pulsed Doppler interrogation of both testes demonstrates normal low resistance arterial and venous waveforms bilaterally. IMPRESSION: Suspected right epididymo-orchitis. Associated moderate complex right hydrocele. Left varicocele. No evidence of testicular torsion. Electronically Signed   By: Julian Hy M.D.   On: 01/19/2016 10:37   Dg Abdomen Acute W/chest  Result Date: 01/19/2016 CLINICAL DATA:  One day history of right lower quadrant pain EXAM: DG ABDOMEN ACUTE W/ 1V CHEST COMPARISON:  Chest radiograph March 12, 2012; CT abdomen and pelvis March 12, 2012 FINDINGS: PA chest: The degree of inspiration is shallow. No edema or consolidation. Heart is upper normal in size with pulmonary vascularity within normal limits. No adenopathy. Supine and upright abdomen: There is fairly diffuse stool throughout the colon. There is no bowel dilatation. There are multiple air-fluid levels on the right. No free air evident. There are phleboliths in the pelvis. IMPRESSION: Scattered air-fluid levels on the right. No bowel dilatation. Question early ileus or enteritis. Bowel obstruction is not felt to be likely given this overall  appearance. No free air. There is no parenchymal lung edema or consolidation. Electronically Signed   By: Lowella Grip III M.D.   On: 01/19/2016 09:27    Procedures Procedures (including critical care time)  Medications Ordered in ED Medications  LORazepam (ATIVAN) tablet 1 mg (not administered)  tamsulosin (FLOMAX) capsule 0.4 mg (0.4 mg Oral Given 01/19/16 1709)  enoxaparin (LOVENOX) injection 40 mg (40 mg Subcutaneous Given 01/19/16 1639)  0.9 %  sodium chloride infusion ( Intravenous New Bag/Given 01/19/16 1634)  ondansetron (ZOFRAN) tablet 4 mg (not administered)    Or  ondansetron (ZOFRAN) injection 4 mg (not administered)  ketorolac (TORADOL) 15 MG/ML injection 15 mg (not administered)  oxyCODONE (Oxy IR/ROXICODONE) immediate release tablet 5 mg (not administered)  doxepin (SINEQUAN) 10 MG/ML solution 6 mg (not administered)  losartan (COZAAR) tablet 100 mg (not administered)  hydrochlorothiazide (HYDRODIURIL) tablet 25 mg (not administered)  hydrALAZINE (APRESOLINE) injection 5-10  mg (5 mg Intravenous Given 01/19/16 1640)  levofloxacin (LEVAQUIN) IVPB 500 mg (not administered)  HYDROmorphone (DILAUDID) injection 1 mg (1 mg Intravenous Given 01/19/16 0856)  ondansetron (ZOFRAN) injection 4 mg (4 mg Intravenous Given 01/19/16 0855)  sodium chloride 0.9 % bolus 1,000 mL (0 mLs Intravenous Stopped 01/19/16 1031)  HYDROmorphone (DILAUDID) injection 1 mg (1 mg Intravenous Given 01/19/16 1056)  ketorolac (TORADOL) 15 MG/ML injection 15 mg (15 mg Intravenous Given 01/19/16 1154)  sodium chloride 0.9 % bolus 1,000 mL (0 mLs Intravenous Stopped 01/19/16 1309)  levofloxacin (LEVAQUIN) IVPB 500 mg (500 mg Intravenous New Bag/Given 01/19/16 1213)  iopamidol (ISOVUE-300) 61 % injection (100 mLs  Contrast Given 01/19/16 1425)     Initial Impression / Assessment and Plan / ED Course  I have reviewed the triage vital signs and the nursing notes.  Pertinent labs & imaging results that were available during my  care of the patient were reviewed by me and considered in my medical decision making (see chart for details).  Clinical Course  70 year old male with past medical history of BPH and nephrolithiasis who presents with a several day history of severe right testicular pain now with nausea and vomiting. Examination is as above. Findings concerning for acute epididymoorchitis versus epididymal abscess. The scrotum itself does not appear to have cellulitis or skin abscess. Torsion on the differential, but cremasterics are intact bilaterally and fever, tachycardiac is more consistent with infectious etiology. Denies any STD exposures. He is monogamous. Denies any anal intercourse. Incarcerated inguinal hernia could also present similar to this although mass appears more c/w testicular etiology. Stat AAS shows no obstruction or free air. Will check broad labs and obtain stat ultrasound.   Ultrasound confirmed epididymoorchitis. Labs otherwise showed leukocytosis and mild lactic acidosis. His lactate has trended down in the ED, but remains mildly elevated. Pain is improving with IV fluids, analgesia. Discussed case with pharmacy. Will cover with Levaquin. I also discussed case with urology, who recommends antibiotics and pain control. No operative intervention required. Will admit to medicine for continued IV fluids and close monitoring. CT scan ordered based on discussion with medicine given significant pain, lactic acidosis, to eval for spread/concomitant infection.  Final Clinical Impressions(s) / ED Diagnoses   Final diagnoses:  Testicular pain, right  Hypoxemia  Epididymoorchitis  Sepsis, due to unspecified organism [facility])    New Prescriptions Current Discharge Medication List       Duffy Bruce, MD 01/19/16 2029

## 2016-01-19 NOTE — Telephone Encounter (Signed)
PA denied.

## 2016-01-19 NOTE — ED Notes (Signed)
Patient in [facility] at this time.

## 2016-01-19 NOTE — ED Triage Notes (Signed)
Pt reports groin pain to right testicle since yesterday. Had a physical last week and they reported inflammation to groin area but nothing specific. Pt denies urinary sx. Appears uncomfortable.

## 2016-01-19 NOTE — Progress Notes (Signed)
Pharmacy Antibiotic Note  Calvin [name] is a 70 y.o. male admitted on 01/19/2016 with epididymo-orchitis.  Pharmacy has been consulted for levofloxacin dosing.  He was seen for possible UTI on 7/24 and was told he needed antibiotics however these were never prescribed.  Discussed with EDP Dr. Myrene Buddy and since patient is monogamous and is >60 years old, not concerned for gonorrhea or chlamydia infection, therefore will cover for enteric pathogens with levofloxacin.   Plan: -Levofloxacin 500mg  IV q24h- first dose was given this morning in ED -anticipated 10 day course, but will follow clinical progression and any changes to this plan      Temp (24hrs), Avg:99.3 F (37.4 C), Min:99.3 F (37.4 C), Max:99.3 F (37.4 C)   Recent Labs Lab 01/19/16 0845 01/19/16 0850 01/19/16 1134  WBC 13.1*  --   --   CREATININE 1.04  --   --   LATICACIDVEN  --  2.68* 2.48*    Estimated Creatinine Clearance: 84.4 mL/min (by C-G formula based on SCr of 1.04 mg/dL).    No Known Allergies  Antimicrobials this admission: levofloxacin 8/3 >>   Dose adjustments this admission: n/a  Microbiology results: 8/3 BCx: sent 8/3 UCx: sent    Thank you for allowing pharmacy to be a part of this patient's care.  Brityn Mastrogiovanni D. Sreekar Broyhill, PharmD, BCPS Clinical Pharmacist Pager: [phone] 01/19/2016 2:38 PM

## 2016-01-19 NOTE — Progress Notes (Signed)
Pt arrived to 5w07, call light in reach for patient, oriented to plan of day

## 2016-01-19 NOTE — ED Notes (Signed)
Patient was given ice chips. 

## 2016-01-19 NOTE — ED Notes (Signed)
Patient transported to Ultrasound 

## 2016-01-20 ENCOUNTER — Observation Stay (HOSPITAL_COMMUNITY): Payer: Medicare Other

## 2016-01-20 DIAGNOSIS — N451 Epididymitis: Secondary | ICD-10-CM

## 2016-01-20 DIAGNOSIS — N50811 Right testicular pain: Secondary | ICD-10-CM | POA: Diagnosis present

## 2016-01-20 DIAGNOSIS — I10 Essential (primary) hypertension: Secondary | ICD-10-CM | POA: Diagnosis present

## 2016-01-20 DIAGNOSIS — Z8249 Family history of ischemic heart disease and other diseases of the circulatory system: Secondary | ICD-10-CM | POA: Diagnosis not present

## 2016-01-20 DIAGNOSIS — M7989 Other specified soft tissue disorders: Secondary | ICD-10-CM

## 2016-01-20 DIAGNOSIS — F329 Major depressive disorder, single episode, unspecified: Secondary | ICD-10-CM | POA: Diagnosis present

## 2016-01-20 DIAGNOSIS — N400 Enlarged prostate without lower urinary tract symptoms: Secondary | ICD-10-CM | POA: Diagnosis present

## 2016-01-20 DIAGNOSIS — N453 Epididymo-orchitis: Secondary | ICD-10-CM | POA: Diagnosis present

## 2016-01-20 DIAGNOSIS — Z823 Family history of stroke: Secondary | ICD-10-CM | POA: Diagnosis not present

## 2016-01-20 DIAGNOSIS — N390 Urinary tract infection, site not specified: Secondary | ICD-10-CM | POA: Diagnosis present

## 2016-01-20 DIAGNOSIS — Z87442 Personal history of urinary calculi: Secondary | ICD-10-CM | POA: Diagnosis not present

## 2016-01-20 DIAGNOSIS — E291 Testicular hypofunction: Secondary | ICD-10-CM | POA: Diagnosis present

## 2016-01-20 DIAGNOSIS — B9620 Unspecified Escherichia coli [E. coli] as the cause of diseases classified elsewhere: Secondary | ICD-10-CM | POA: Diagnosis present

## 2016-01-20 DIAGNOSIS — F419 Anxiety disorder, unspecified: Secondary | ICD-10-CM | POA: Diagnosis present

## 2016-01-20 LAB — BASIC METABOLIC PANEL
ANION GAP: 9 (ref 5–15)
BUN: 16 mg/dL (ref 6–20)
CALCIUM: 8.7 mg/dL — AB (ref 8.9–10.3)
CO2: 25 mmol/L (ref 22–32)
Chloride: 102 mmol/L (ref 101–111)
Creatinine, Ser: 0.86 mg/dL (ref 0.61–1.24)
GFR calc non Af Amer: >60 mL/min (ref >60)
Glucose, Bld: 103 mg/dL — ABNORMAL HIGH (ref 65–99)
Potassium: 3.2 mmol/L — ABNORMAL LOW (ref 3.5–5.1)
Sodium: 136 mmol/L (ref 135–145)

## 2016-01-20 LAB — CBC
HEMATOCRIT: 40.4 % (ref 39.0–52.0)
HEMOGLOBIN: 13.8 g/dL (ref 13.0–17.0)
MCH: 34.6 pg — ABNORMAL HIGH (ref 26.0–34.0)
MCHC: 34.2 g/dL (ref 30.0–36.0)
MCV: 101.3 fL — ABNORMAL HIGH (ref 78.0–100.0)
Platelets: 182 10*3/uL (ref 150–400)
RBC: 3.99 MIL/uL — ABNORMAL LOW (ref 4.22–5.81)
RDW: 12.7 % (ref 11.5–15.5)
WBC: 11.9 10*3/uL — AB (ref 4.0–10.5)

## 2016-01-20 LAB — GLUCOSE, CAPILLARY: Glucose-Capillary: 112 mg/dL — ABNORMAL HIGH (ref 65–99)

## 2016-01-20 MED ORDER — POTASSIUM CHLORIDE CRYS ER 20 MEQ PO TBCR
40.0000 meq | EXTENDED_RELEASE_TABLET | Freq: Once | ORAL | Status: AC
  Administered 2016-01-20: 40 meq via ORAL
  Filled 2016-01-20: qty 2

## 2016-01-20 NOTE — Care Management Obs Status (Signed)
[facility] NOTIFICATION   Patient Details  Name: Calvin [name] [name]: [id number] Date of Birth: 10-10-1945   Medicare Observation Status Notification Given:  Yes    Carles Collet, RN 01/20/2016, 9:22 AM

## 2016-01-20 NOTE — Care Management Note (Signed)
Case Management Note  Patient Details  Name: Calvin [name] [name]: [id number] Date of Birth: 1945-09-14  Subjective/Objective:                 Patient admitted from home, lives alone, is independent, drives, and still works some. He denoes difficulties obtaining/ paying for meds. PCP is Dr Sarajane Jews, pharmacy is [facility] on [address].    Action/Plan:  In obs for epididymitis.  No CM needs identified.   Expected Discharge Date:                  Expected Discharge Plan:  Home/Self Care  In-House Referral:     Discharge planning Services  CM Consult  Post Acute Care Choice:  NA Choice offered to:  NA  DME Arranged:  N/A DME Agency:  NA  HH Arranged:  NA HH Agency:  NA  Status of Service:  Completed, signed off  If discussed at [place] of Stay Meetings, dates discussed:    Additional Comments:  Carles Collet, RN 01/20/2016, 9:23 AM

## 2016-01-20 NOTE — Progress Notes (Signed)
**  Preliminary report by tech**  No evidence of deep vein or superficial thrombosis involving the right lower extremity and left common femoral vein. No evidence of right Baker's cyst.   01/20/16 11:00 AM Calvin [name] RVT

## 2016-01-20 NOTE — Progress Notes (Signed)
[facility]                                                                              Patient Demographics  Calvin [name], is a 70 y.o. male, DOB - [birth year], [id number]  Admit date - 01/19/2016   Admitting Physician Waldemar Dickens, MD  Outpatient Primary MD for the patient is Laurey Morale, MD  Outpatient specialists:   LOS - 0  days    Chief Complaint  Patient presents with  . Groin Pain       Brief summary    Calvin [name] is a 70 y.o. male with medical history significant of BPH, HTN, headaches, nephrolithiasis, presenting with 24 hours of worsening constant testicular pain and significant swelling in the scrotal region. Patient states he was evaluated on 01/09/2016 for possible UTI and was told he had a small infection would need to start antibiotics. However antibiotics were never prescribed. Denied any dysuria, hematuria, penile discharge, penile lesions, fevers, abdominal pain, coronary adenopathy, nausea, vomiting, diarrhea, neck stiffness, headache.    Assessment & Plan    Right Epididymo-orchitis with urinary tract infection: R side. Lactic acid 2.5, WBC 13, afebrile, significant scrotal swelling with tenderness to palpation diffusely. - CT abdomen and pelvis consistent with right epididymo-orchitis, with no testicle torsion - Continue IV antibiotics, pain control, follow blood cultures, urine cultures  - Discussed with Dr. Risa Grill, does not need urology evaluation in patient however will need antibiotics according to the sensitivities for at least 3-4 weeks and outpatient urology follow-up  Gram-negative rod UTI - Follow urine cultures, for now continue IV Levaquin  R foot swelling: constant for several months. No trauma.   Depression/anxiety: - continue doxepin, Ativan  HTN: - continue hyzaar  BPH: - continue flomax  Hypogonadism: - testosterone therapy per PCP (pt has not yet initiated therapy)   Code Status: fc DVT  Prophylaxis:  Lovenox Family Communication: Discussed in detail with the patient, all imaging results, lab results explained to the patient   Disposition Plan:   Time Spent in minutes  25 minutes  Procedures:  CT abdomen and pelvis Scrotal ultrasound  Consultants:     Antimicrobials:   IV Levaquin   Medications  Scheduled Meds: . doxepin  6 mg Oral QHS  . enoxaparin (LOVENOX) injection  40 mg Subcutaneous Q24H  . folic acid  1 mg Oral Daily  . hydrochlorothiazide  25 mg Oral Daily  . levofloxacin (LEVAQUIN) IV  500 mg Intravenous Q24H  . LORazepam  0-4 mg Oral Q6H   Followed by  . [START ON 01/21/2016] LORazepam  0-4 mg Oral Q12H  . losartan  100 mg Oral Daily  . multivitamin with minerals  1 tablet Oral Daily  . tamsulosin  0.4 mg Oral Daily  . thiamine  100 mg Oral Daily   Or  . thiamine  100 mg Intravenous Daily   Continuous Infusions: . sodium chloride 1,000 mL (01/20/16 0304)   PRN Meds:.hydrALAZINE, ketorolac, LORazepam **OR** LORazepam, ondansetron **OR** ondansetron (ZOFRAN) IV, oxyCODONE   Antibiotics   Anti-infectives    Start     Dose/Rate Route Frequency Ordered Stop  01/20/16 1000  levofloxacin (LEVAQUIN) IVPB 500 mg     500 mg 100 mL/hr over 60 Minutes Intravenous Every 24 hours 01/19/16 1754     01/19/16 1200  levofloxacin (LEVAQUIN) IVPB 500 mg     500 mg 100 mL/hr over 60 Minutes Intravenous  Once 01/19/16 1119 01/19/16 1313        Subjective:   Calvin [name] was seen and examined today. Overall improving, no fevers or chills, testicle pain improving. Patient denies dizziness, chest pain, shortness of breath, abdominal pain, N/V/D/C, new weakness, numbess, tingling. No acute events overnight.    Objective:   Vitals:   01/19/16 2044 01/20/16 0000 01/20/16 0020 01/20/16 0527  BP: 122/77  137/89 (!) 161/94  Pulse: (!) 101 92 92 92  Resp: 19  18 18   Temp: 99.1 F (37.3 C)  98.7 F (37.1 C) 98.3 F (36.8 C)  TempSrc: Oral  Oral Oral    SpO2: 95%  97% 96%  Weight:      Height:        Intake/Output Summary (Last 24 hours) at 01/20/16 1157 Last data filed at 01/20/16 0630  Gross per 24 hour  Intake          1393.33 ml  Output                0 ml  Net          1393.33 ml     Wt Readings from Last 3 Encounters:  01/19/16 105.7 kg (233 lb)  01/09/16 104.8 kg (231 lb)  09/26/15 98.9 kg (218 lb)     Exam  General: Alert and oriented x 3, NAD  HEENT:  PERRLA, EOMI, Anicteric Sclera, mucous membranes moist.   Neck: Supple, no JVD, no masses  Cardiovascular: S1 S2 auscultated, no rubs, murmurs or gallops. Regular rate and rhythm.  Respiratory: Clear to auscultation bilaterally, no wheezing, rales or rhonchi  Gastrointestinal: Soft, nontender, nondistended, + bowel sounds  Ext: no cyanosis clubbing or edema  Neuro: AAOx3, Cr N's II- XII. Strength 5/5 upper and lower extremities bilaterally  Skin: No rashes  Psych: Normal affect and demeanor, alert and oriented x3    Data Reviewed:  I have personally reviewed following labs and imaging studies  Micro Results Recent Results (from the past 240 hour(s))  Urine culture     Status: Abnormal (Preliminary result)   Collection Time: 01/19/16 12:00 PM  Result Value Ref Range Status   Specimen Description URINE, CLEAN CATCH  Final   Special Requests NONE  Final   Culture >=100,000 COLONIES/mL GRAM NEGATIVE RODS (A)  Final   Report Status PENDING  Incomplete    Radiology Reports Dg Chest 2 View  Result Date: 01/19/2016 CLINICAL DATA:  Hypoxemia. EXAM: CHEST  2 VIEW COMPARISON:  01/19/2016.  03/12/2012. FINDINGS: The heart is enlarged. The aorta is unfolded. There chronic interstitial lung markings in the lower lungs likely represent chronic scarring. Upper lungs are clear. No evidence heart failure or effusion. No significant bone finding. IMPRESSION: Cardiomegaly.  Unfolded aorta.  Chronic scarring in the lower lungs. Electronically Signed   By: Nelson Chimes  M.D.   On: 01/19/2016 15:11   US Scrotum  Result Date: 01/19/2016 CLINICAL DATA:  Right testicular pain/swelling x1 day EXAM: SCROTAL ULTRASOUND DOPPLER ULTRASOUND OF THE TESTICLES TECHNIQUE: Complete ultrasound examination of the testicles, epididymis, and other scrotal structures was performed. Color and spectral Doppler ultrasound were also utilized to evaluate blood flow to the testicles. COMPARISON:  None. FINDINGS: Right testicle Measurements: 5.1 x 4.0 x 3.6 cm. No mass or microlithiasis visualized. Hyperemia relative to the left. Left testicle Measurements: 5.4 x 2.7 x 3.9 cm. No mass or microlithiasis visualized. Right epididymis: Enlarged and hypervascular. 5 mm epididymal cyst. Left epididymis:  4 mm epididymal cyst. Hydrocele:  Moderate complex right hydrocele. Varicocele:  Present on the left. Pulsed Doppler interrogation of both testes demonstrates normal low resistance arterial and venous waveforms bilaterally. IMPRESSION: Suspected right epididymo-orchitis. Associated moderate complex right hydrocele. Left varicocele. No evidence of testicular torsion. Electronically Signed   By: Julian Hy M.D.   On: 01/19/2016 10:37   Ct Abdomen Pelvis W Contrast  Result Date: 01/19/2016 CLINICAL DATA:  Right scrotal pain infection for the past week. Nausea and vomiting this morning. Previous appendectomy. EXAM: CT ABDOMEN AND PELVIS WITH CONTRAST TECHNIQUE: Multidetector CT imaging of the abdomen and pelvis was performed using the standard protocol following bolus administration of intravenous contrast. CONTRAST:  144mL ISOVUE-300 IOPAMIDOL (ISOVUE-300) INJECTION 61% COMPARISON:  Scrotal ultrasound obtained earlier today. Abdomen and pelvis CT dated 03/12/2012 and 01/02/2008. FINDINGS: Lower chest: Linear scarring at both lung bases. Small probable subpleural lymph node along the major fissure on the left on image 8. 5 mm left lower lobe nodule on image number 23, unchanged since 01/02/2008. A small  noncalcified pleural plaque on the right hemidiaphragm on image number 12 is significantly smaller than on 01/02/2008. Coronary artery calcifications are noted. Hepatobiliary: Multiple liver cysts are again demonstrated. Normal appearing gallbladder. Pancreas: No mass, inflammatory changes, or other significant abnormality. Spleen: Within normal limits in size and appearance. Adrenals/Urinary Tract: Low density right adrenal mass measuring 2.4 x 1.8 cm on image number 35. This measured 1.5 x 1.3 cm normal 04/11/2012 and 1.3 x 1.1 cm on 01/02/2008. Normal appearing left adrenal gland. Left renal cysts are again demonstrated. Small bilateral parapelvic renal cysts are also noted. No urinary tract calculi, masses or hydronephrosis. Stomach/Bowel: Multiple colonic diverticula. Small sliding hiatal hernia. Unremarkable small bowel. The proximal portion of the appendix remains in place, with probable surgical sutures or staples at the distal aspect of the remaining portion of the appendix. Vascular/Lymphatic: Mild atheromatous arterial calcifications, including the abdominal aorta. No enlarged lymph nodes. Reproductive: Moderate to markedly enlarged and heterogeneous prostate gland, indenting into the base of the urinary bladder, without change. The right testicle appears higher in density and more heterogeneous than the left testicle. Other: Small bilateral inguinal hernias containing fat. Small umbilical and supraumbilical hernias containing fat. Musculoskeletal: Lumbar and lower thoracic spine degenerative changes. Interval 20% T11 superior endplate compression deformity with no significant bony retropulsion and no acute fracture lines visualized. IMPRESSION: 1. Changes of right epididymo-orchitis described at ultrasound earlier today. 2. Slowly enlarging right adrenal probable adenoma. 3. Stable moderately to markedly enlarged and heterogeneous prostate gland. 4. Small bilateral inguinal, umbilical and supraumbilical  hernias containing fat. 5. Aortic atherosclerosis coronary artery atherosclerosis. 6. Stable benign nodularity at the lung bases. 7. Interval old 20% T11 superior endplate compression deformity. 8. Small sliding hiatal hernia. 9. Colonic diverticulosis. Electronically Signed   By: Claudie Revering M.D.   On: 01/19/2016 15:12   [place] Art/ven Flow Abd Pelv Doppler  Result Date: 01/19/2016 CLINICAL DATA:  Right testicular pain/swelling x1 day EXAM: SCROTAL ULTRASOUND DOPPLER ULTRASOUND OF THE TESTICLES TECHNIQUE: Complete ultrasound examination of the testicles, epididymis, and other scrotal structures was performed. Color and spectral Doppler ultrasound were also utilized to evaluate blood flow to the testicles. COMPARISON:  None. FINDINGS: Right testicle Measurements: 5.1 x 4.0 x 3.6 cm. No mass or microlithiasis visualized. Hyperemia relative to the left. Left testicle Measurements: 5.4 x 2.7 x 3.9 cm. No mass or microlithiasis visualized. Right epididymis: Enlarged and hypervascular. 5 mm epididymal cyst. Left epididymis:  4 mm epididymal cyst. Hydrocele:  Moderate complex right hydrocele. Varicocele:  Present on the left. Pulsed Doppler interrogation of both testes demonstrates normal low resistance arterial and venous waveforms bilaterally. IMPRESSION: Suspected right epididymo-orchitis. Associated moderate complex right hydrocele. Left varicocele. No evidence of testicular torsion. Electronically Signed   By: Julian Hy M.D.   On: 01/19/2016 10:37   Dg Abdomen Acute W/chest  Result Date: 01/19/2016 CLINICAL DATA:  One day history of right lower quadrant pain EXAM: DG ABDOMEN ACUTE W/ 1V CHEST COMPARISON:  Chest radiograph March 12, 2012; CT abdomen and pelvis March 12, 2012 FINDINGS: PA chest: The degree of inspiration is shallow. No edema or consolidation. Heart is upper normal in size with pulmonary vascularity within normal limits. No adenopathy. Supine and upright abdomen: There is fairly  diffuse stool throughout the colon. There is no bowel dilatation. There are multiple air-fluid levels on the right. No free air evident. There are phleboliths in the pelvis. IMPRESSION: Scattered air-fluid levels on the right. No bowel dilatation. Question early ileus or enteritis. Bowel obstruction is not felt to be likely given this overall appearance. No free air. There is no parenchymal lung edema or consolidation. Electronically Signed   By: Lowella Grip III M.D.   On: 01/19/2016 09:27    Lab Data:  CBC:  Recent Labs Lab 01/19/16 0845 01/20/16 0503  WBC 13.1* 11.9*  NEUTROABS 11.4*  --   HGB 15.9 13.8  HCT 44.0 40.4  MCV 99.1 101.3*  PLT 223 [id number]   Basic Metabolic Panel:  Recent Labs Lab 01/19/16 0845 01/20/16 0503  NA 135 136  K 3.5 3.2*  CL 102 102  CO2 22 25  GLUCOSE 167* 103*  BUN 19 16  CREATININE 1.04 0.86  CALCIUM 9.3 8.7*   GFR: Estimated Creatinine Clearance: 103.3 mL/min (by C-G formula based on SCr of 0.86 mg/dL). Liver Function Tests:  Recent Labs Lab 01/19/16 0845  AST 53*  ALT 109*  ALKPHOS 56  BILITOT 1.3*  PROT 6.9  ALBUMIN 4.1   No results for input(s): LIPASE, AMYLASE in the last 168 hours. No results for input(s): AMMONIA in the last 168 hours. Coagulation Profile: No results for input(s): INR, PROTIME in the last 168 hours. Cardiac Enzymes: No results for input(s): CKTOTAL, CKMB, CKMBINDEX, TROPONINI in the last 168 hours. BNP (last 3 results) No results for input(s): PROBNP in the last 8760 hours. HbA1C: No results for input(s): HGBA1C in the last 72 hours. CBG:  Recent Labs Lab 01/19/16 2046 01/20/16 0754  GLUCAP 125* 112*   Lipid Profile: No results for input(s): CHOL, HDL, LDLCALC, TRIG, CHOLHDL, LDLDIRECT in the last 72 hours. Thyroid Function Tests: No results for input(s): TSH, T4TOTAL, FREET4, T3FREE, THYROIDAB in the last 72 hours. Anemia Panel: No results for input(s): VITAMINB12, FOLATE, FERRITIN, TIBC,  IRON, RETICCTPCT in the last 72 hours. Urine analysis:    Component Value Date/Time   COLORURINE AMBER (A) 03/12/2012 1250   APPEARANCEUR CLEAR 03/12/2012 1250   LABSPEC 1.025 03/12/2012 1250   PHURINE 6.0 03/12/2012 1250   GLUCOSEU NEGATIVE 03/12/2012 1250   HGBUR NEGATIVE 03/12/2012 1250   HGBUR negative 12/14/2009 0823   BILIRUBINUR n 01/09/2016 1236   KETONESUR 15 (  A) 03/12/2012 1250   PROTEINUR 1+ 01/09/2016 1236   PROTEINUR NEGATIVE 03/12/2012 1250   UROBILINOGEN 1.0 01/09/2016 1236   UROBILINOGEN 1.0 03/12/2012 1250   NITRITE positive 01/09/2016 1236   NITRITE NEGATIVE 03/12/2012 1250   LEUKOCYTESUR small (1+) (A) 01/09/2016 1236     Armstead Heiland M.D. [facility] 01/20/2016, 11:57 AM  Pager: [phone] Between 7am to 7pm - call Pager - [phone]  After 7pm go to [email or web address] - password [email or web address]  Call night coverage person covering after 7pm

## 2016-01-21 LAB — BASIC METABOLIC PANEL
Anion gap: 12 (ref 5–15)
BUN: 16 mg/dL (ref 6–20)
CHLORIDE: 104 mmol/L (ref 101–111)
CO2: 21 mmol/L — AB (ref 22–32)
CREATININE: 0.73 mg/dL (ref 0.61–1.24)
Calcium: 9 mg/dL (ref 8.9–10.3)
GFR calc non Af Amer: >60 mL/min (ref >60)
Glucose, Bld: 98 mg/dL (ref 65–99)
POTASSIUM: 3.3 mmol/L — AB (ref 3.5–5.1)
SODIUM: 137 mmol/L (ref 135–145)

## 2016-01-21 LAB — URINE CULTURE: Culture: >=100000 — AB

## 2016-01-21 MED ORDER — LEVOFLOXACIN 500 MG PO TABS: 500.0000 mg | ORAL_TABLET | Freq: Every day | ORAL | 0 refills | Status: DC

## 2016-01-21 MED ORDER — POTASSIUM CHLORIDE CRYS ER 20 MEQ PO TBCR
40.0000 meq | EXTENDED_RELEASE_TABLET | Freq: Once | ORAL | Status: AC
  Administered 2016-01-21: 40 meq via ORAL
  Filled 2016-01-21: qty 2

## 2016-01-21 MED ORDER — SACCHAROMYCES BOULARDII 250 MG PO CAPS: 250.0000 mg | ORAL_CAPSULE | Freq: Two times a day (BID) | ORAL | 0 refills | Status: DC

## 2016-01-21 MED ORDER — TRAMADOL HCL 50 MG PO TABS: 50.0000 mg | ORAL_TABLET | Freq: Four times a day (QID) | ORAL | 0 refills | Status: DC | PRN

## 2016-01-21 MED ORDER — KETOROLAC TROMETHAMINE 15 MG/ML IJ SOLN
15.0000 mg | Freq: Four times a day (QID) | INTRAMUSCULAR | Status: DC
  Administered 2016-01-21: 15 mg via INTRAVENOUS
  Filled 2016-01-21: qty 1

## 2016-01-21 NOTE — Progress Notes (Signed)
NURSING PROGRESS NOTE  Calvin [name] VA:[id number] Discharge Data: 01/21/2016 5:17 PM Attending Provider: No att. providers found PCP:FRY,STEPHEN A, MD     [name] to be D/C'd Home per MD order.  Discussed with the patient the After Visit Summary and all questions fully answered. All IV's discontinued with no bleeding noted. All belongings returned to patient for patient to take home.   Last Vital Signs:  Blood pressure (!) 140/91, pulse (!) 57, temperature 98.4 F (36.9 C), temperature source Oral, resp. rate 19, height 6' (1.829 m), weight 105.7 kg (233 lb), SpO2 100 %.  Discharge Medication List   Medication List    STOP taking these medications   ciprofloxacin 500 MG tablet Commonly known as:  CIPRO   doxycycline 50 MG capsule Commonly known as:  VIBRAMYCIN     TAKE these medications   Doxepin HCl 6 MG Tabs Take 1 tablet (6 mg total) by mouth at bedtime.   levofloxacin 500 MG tablet Commonly known as:  LEVAQUIN Take 1 tablet (500 mg total) by mouth daily. X 21 days Start taking on:  01/22/2016   LORazepam 1 MG tablet Commonly known as:  ATIVAN Take 1 tablet (1 mg total) by mouth every 8 (eight) hours as needed for anxiety.   losartan-hydrochlorothiazide 100-25 MG tablet Commonly known as:  HYZAAR TAKE 1 TPO EVERY DAY What changed:  how much to take  how to take this  when to take this  additional instructions   saccharomyces boulardii 250 MG capsule Commonly known as:  FLORASTOR Take 1 capsule (250 mg total) by mouth 2 (two) times daily. Or any genetic probiotic available, while taking antibiotics   sildenafil 100 MG tablet Commonly known as:  VIAGRA Take 1 tablet (100 mg total) by mouth as needed for erectile dysfunction.   tamsulosin 0.4 MG Caps capsule Commonly known as:  FLOMAX Take 1 capsule (0.4 mg total) by mouth daily.   Testosterone 20.25 MG/1.25GM (1.62%) Gel Commonly known as:  ANDROGEL Apply 4 pumps once a day to skin   traMADol 50  MG tablet Commonly known as:  ULTRAM Take 1 tablet (50 mg total) by mouth every 6 (six) hours as needed for moderate pain or severe pain.

## 2016-01-21 NOTE — Discharge Summary (Signed)
Physician Discharge Summary   Patient ID: [name] [name]: [id number] DOB/AGE: 68-Jun-1947 68 y.o.  Admit date: 01/19/2016 Discharge date: 01/21/2016  Primary Care Physician:  Laurey Morale, MD  Discharge Diagnoses:    . right Epididymitis-orchitis    Escherichia coli UTI . Essential hypertension . BPH (benign prostatic hyperplasia) . Depression   Consults:   Urology-Dr Risa Grill via Phone consultation  Recommendations for Outpatient Follow-up:  1. Please repeat CBC/BMET at next visit   DIET: Regular diet    Allergies:  No Known Allergies   DISCHARGE MEDICATIONS: Current Discharge Medication List    START taking these medications   Details  levofloxacin (LEVAQUIN) 500 MG tablet Take 1 tablet (500 mg total) by mouth daily. X 21 days Qty: 21 tablet, Refills: 0    saccharomyces boulardii (FLORASTOR) 250 MG capsule Take 1 capsule (250 mg total) by mouth 2 (two) times daily. Or any genetic probiotic available, while taking antibiotics Qty: 60 capsule, Refills: 0    traMADol (ULTRAM) 50 MG tablet Take 1 tablet (50 mg total) by mouth every 6 (six) hours as needed for moderate pain or severe pain. Qty: 30 tablet, Refills: 0      CONTINUE these medications which have NOT CHANGED   Details  Doxepin HCl 6 MG TABS Take 1 tablet (6 mg total) by mouth at bedtime. Qty: 30 tablet, Refills: 5    LORazepam (ATIVAN) 1 MG tablet Take 1 tablet (1 mg total) by mouth every 8 (eight) hours as needed for anxiety. Qty: 90 tablet, Refills: 5    losartan-hydrochlorothiazide (HYZAAR) 100-25 MG tablet TAKE 1 TPO EVERY DAY Qty: 90 tablet, Refills: 3    tamsulosin (FLOMAX) 0.4 MG CAPS capsule Take 1 capsule (0.4 mg total) by mouth daily. Qty: 90 capsule, Refills: 3    sildenafil (VIAGRA) 100 MG tablet Take 1 tablet (100 mg total) by mouth as needed for erectile dysfunction. Qty: 60 tablet, Refills: 3    Testosterone (ANDROGEL) 20.25 MG/1.25GM (1.62%) GEL Apply 4 pumps once a day to  skin Qty: 150 g, Refills: 1      STOP taking these medications     doxycycline (VIBRAMYCIN) 50 MG capsule      ciprofloxacin (CIPRO) 500 MG tablet          Brief H and P: For complete details please refer to admission H and P, but in brief Calvin [name] a 70 y.o.malewith medical history significant of BPH, HTN, headaches, nephrolithiasis, presenting with 24 hours of worsening constant testicular pain and significant swelling in the scrotal region. Patient states he was evaluated on 01/09/2016 for possible UTI and was told he had a small infection would need to start antibiotics. However antibiotics were never prescribed. Denied any dysuria, hematuria, penile discharge, penile lesions, fevers, abdominal pain, coronary adenopathy, nausea, vomiting, diarrhea, neck stiffness, headache  Hospital Course:  Right Epididymo-orchitis with Escherichia coli urinary tract infection: R side. Lactic acid 2.5, WBC 13, afebrile, significant scrotal swelling with tenderness to palpation diffusely. - CT abdomen and pelvis consistent with right epididymo-orchitis, with no testicle torsion - Patient was placed on IV levofloxacin, urine culture showed Escherichia coli sensitive to fluoroquinolones. Blood cultures remain negative.  - Discussed with Dr. Risa Grill, does not need urology evaluation in patient however will need antibiotics according to the sensitivities for at least 3-4 weeks and outpatient urology follow-up. Patient discharged on levofloxacin for additional 21 days with probiotics and follow-up outpatient with urology.   R foot swelling: constant for several months.  No trauma.   Depression/anxiety: - continue doxepin, Ativan  HTN: - continue hyzaar  BPH: - continue flomax  Hypogonadism: - testosterone therapy per PCP (pt has not yet initiated therapy)    Day of Discharge BP (!) 145/94 (BP Location: Right Wrist)   Pulse 77   Temp 99.2 F (37.3 C)   Resp 19   Ht 6'  (1.829 m)   Wt 105.7 kg (233 lb)   SpO2 94%   BMI 31.60 kg/m   Physical Exam: General: Alert and awake oriented x3 not in any acute distress. HEENT: anicteric sclera, pupils reactive to light and accommodation CVS: S1-S2 clear no murmur rubs or gallops Chest: clear to auscultation bilaterally, no wheezing rales or rhonchi Abdomen: soft nontender, nondistended, normal bowel sounds GU: swollen scrotum, erythematous, improving Extremities: no cyanosis, clubbing or edema noted bilaterally Neuro: Cranial nerves II-XII intact, no focal neurological deficits   The results of significant diagnostics from this hospitalization (including imaging, microbiology, ancillary and laboratory) are listed below for reference.    LAB RESULTS: Basic Metabolic Panel:  Recent Labs Lab 01/20/16 0503 01/21/16 0438  NA 136 137  K 3.2* 3.3*  CL 102 104  CO2 25 21*  GLUCOSE 103* 98  BUN 16 16  CREATININE 0.86 0.73  CALCIUM 8.7* 9.0   Liver Function Tests:  Recent Labs Lab 01/19/16 0845  AST 53*  ALT 109*  ALKPHOS 56  BILITOT 1.3*  PROT 6.9  ALBUMIN 4.1   No results for input(s): LIPASE, AMYLASE in the last 168 hours. No results for input(s): AMMONIA in the last 168 hours. CBC:  Recent Labs Lab 01/19/16 0845 01/20/16 0503  WBC 13.1* 11.9*  NEUTROABS 11.4*  --   HGB 15.9 13.8  HCT 44.0 40.4  MCV 99.1 101.3*  PLT 223 182   Cardiac Enzymes: No results for input(s): CKTOTAL, CKMB, CKMBINDEX, TROPONINI in the last 168 hours. BNP: Invalid input(s): POCBNP CBG:  Recent Labs Lab 01/19/16 2046 01/20/16 0754  GLUCAP 125* 112*    Significant Diagnostic Studies:  Dg Chest 2 View  Result Date: 01/19/2016 CLINICAL DATA:  Hypoxemia. EXAM: CHEST  2 VIEW COMPARISON:  01/19/2016.  03/12/2012. FINDINGS: The heart is enlarged. The aorta is unfolded. There chronic interstitial lung markings in the lower lungs likely represent chronic scarring. Upper lungs are clear. No evidence heart  failure or effusion. No significant bone finding. IMPRESSION: Cardiomegaly.  Unfolded aorta.  Chronic scarring in the lower lungs. Electronically Signed   By: Nelson Chimes M.D.   On: 01/19/2016 15:11   US Scrotum  Result Date: 01/19/2016 CLINICAL DATA:  Right testicular pain/swelling x1 day EXAM: SCROTAL ULTRASOUND DOPPLER ULTRASOUND OF THE TESTICLES TECHNIQUE: Complete ultrasound examination of the testicles, epididymis, and other scrotal structures was performed. Color and spectral Doppler ultrasound were also utilized to evaluate blood flow to the testicles. COMPARISON:  None. FINDINGS: Right testicle Measurements: 5.1 x 4.0 x 3.6 cm. No mass or microlithiasis visualized. Hyperemia relative to the left. Left testicle Measurements: 5.4 x 2.7 x 3.9 cm. No mass or microlithiasis visualized. Right epididymis: Enlarged and hypervascular. 5 mm epididymal cyst. Left epididymis:  4 mm epididymal cyst. Hydrocele:  Moderate complex right hydrocele. Varicocele:  Present on the left. Pulsed Doppler interrogation of both testes demonstrates normal low resistance arterial and venous waveforms bilaterally. IMPRESSION: Suspected right epididymo-orchitis. Associated moderate complex right hydrocele. Left varicocele. No evidence of testicular torsion. Electronically Signed   By: Julian Hy M.D.   On: 01/19/2016 10:37  Ct Abdomen Pelvis W Contrast  Result Date: 01/19/2016 CLINICAL DATA:  Right scrotal pain infection for the past week. Nausea and vomiting this morning. Previous appendectomy. EXAM: CT ABDOMEN AND PELVIS WITH CONTRAST TECHNIQUE: Multidetector CT imaging of the abdomen and pelvis was performed using the standard protocol following bolus administration of intravenous contrast. CONTRAST:  1102mL ISOVUE-300 IOPAMIDOL (ISOVUE-300) INJECTION 61% COMPARISON:  Scrotal ultrasound obtained earlier today. Abdomen and pelvis CT dated 03/12/2012 and 01/02/2008. FINDINGS: Lower chest: Linear scarring at both lung  bases. Small probable subpleural lymph node along the major fissure on the left on image 8. 5 mm left lower lobe nodule on image number 23, unchanged since 01/02/2008. A small noncalcified pleural plaque on the right hemidiaphragm on image number 12 is significantly smaller than on 01/02/2008. Coronary artery calcifications are noted. Hepatobiliary: Multiple liver cysts are again demonstrated. Normal appearing gallbladder. Pancreas: No mass, inflammatory changes, or other significant abnormality. Spleen: Within normal limits in size and appearance. Adrenals/Urinary Tract: Low density right adrenal mass measuring 2.4 x 1.8 cm on image number 35. This measured 1.5 x 1.3 cm normal 04/11/2012 and 1.3 x 1.1 cm on 01/02/2008. Normal appearing left adrenal gland. Left renal cysts are again demonstrated. Small bilateral parapelvic renal cysts are also noted. No urinary tract calculi, masses or hydronephrosis. Stomach/Bowel: Multiple colonic diverticula. Small sliding hiatal hernia. Unremarkable small bowel. The proximal portion of the appendix remains in place, with probable surgical sutures or staples at the distal aspect of the remaining portion of the appendix. Vascular/Lymphatic: Mild atheromatous arterial calcifications, including the abdominal aorta. No enlarged lymph nodes. Reproductive: Moderate to markedly enlarged and heterogeneous prostate gland, indenting into the base of the urinary bladder, without change. The right testicle appears higher in density and more heterogeneous than the left testicle. Other: Small bilateral inguinal hernias containing fat. Small umbilical and supraumbilical hernias containing fat. Musculoskeletal: Lumbar and lower thoracic spine degenerative changes. Interval 20% T11 superior endplate compression deformity with no significant bony retropulsion and no acute fracture lines visualized. IMPRESSION: 1. Changes of right epididymo-orchitis described at ultrasound earlier today. 2. Slowly  enlarging right adrenal probable adenoma. 3. Stable moderately to markedly enlarged and heterogeneous prostate gland. 4. Small bilateral inguinal, umbilical and supraumbilical hernias containing fat. 5. Aortic atherosclerosis coronary artery atherosclerosis. 6. Stable benign nodularity at the lung bases. 7. Interval old 20% T11 superior endplate compression deformity. 8. Small sliding hiatal hernia. 9. Colonic diverticulosis. Electronically Signed   By: Claudie Revering M.D.   On: 01/19/2016 15:12   [place] Art/ven Flow Abd Pelv Doppler  Result Date: 01/19/2016 CLINICAL DATA:  Right testicular pain/swelling x1 day EXAM: SCROTAL ULTRASOUND DOPPLER ULTRASOUND OF THE TESTICLES TECHNIQUE: Complete ultrasound examination of the testicles, epididymis, and other scrotal structures was performed. Color and spectral Doppler ultrasound were also utilized to evaluate blood flow to the testicles. COMPARISON:  None. FINDINGS: Right testicle Measurements: 5.1 x 4.0 x 3.6 cm. No mass or microlithiasis visualized. Hyperemia relative to the left. Left testicle Measurements: 5.4 x 2.7 x 3.9 cm. No mass or microlithiasis visualized. Right epididymis: Enlarged and hypervascular. 5 mm epididymal cyst. Left epididymis:  4 mm epididymal cyst. Hydrocele:  Moderate complex right hydrocele. Varicocele:  Present on the left. Pulsed Doppler interrogation of both testes demonstrates normal low resistance arterial and venous waveforms bilaterally. IMPRESSION: Suspected right epididymo-orchitis. Associated moderate complex right hydrocele. Left varicocele. No evidence of testicular torsion. Electronically Signed   By: Julian Hy M.D.   On: 01/19/2016 10:37  Dg Abdomen Acute W/chest  Result Date: 01/19/2016 CLINICAL DATA:  One day history of right lower quadrant pain EXAM: DG ABDOMEN ACUTE W/ 1V CHEST COMPARISON:  Chest radiograph March 12, 2012; CT abdomen and pelvis March 12, 2012 FINDINGS: PA chest: The degree of inspiration is  shallow. No edema or consolidation. Heart is upper normal in size with pulmonary vascularity within normal limits. No adenopathy. Supine and upright abdomen: There is fairly diffuse stool throughout the colon. There is no bowel dilatation. There are multiple air-fluid levels on the right. No free air evident. There are phleboliths in the pelvis. IMPRESSION: Scattered air-fluid levels on the right. No bowel dilatation. Question early ileus or enteritis. Bowel obstruction is not felt to be likely given this overall appearance. No free air. There is no parenchymal lung edema or consolidation. Electronically Signed   By: Lowella Grip III M.D.   On: 01/19/2016 09:27    2D ECHO:   Disposition and Follow-up: Discharge Instructions    Diet - low sodium heart healthy    Complete by:  As directed   Increase activity slowly    Complete by:  As directed       DISPOSITION: home    [place], MD. Schedule an appointment as soon as possible for a visit in 2 week(s).   Specialty:  Family Medicine Contact information: [place] [address] [phone]        Bernestine Amass, MD. Schedule an appointment as soon as possible for a visit in 2 week(s).   Specialty:  Urology Why:  urology, please call on Monday for appointment   Contact information: [place]  [id number] [phone]            Time spent on Discharge: 53mins   Signed:   Kyndra Condron M.D. [facility] 01/21/2016, 12:14 PM Pager: [phone]

## 2016-01-23 ENCOUNTER — Telehealth: Payer: Self-pay

## 2016-01-23 NOTE — Telephone Encounter (Signed)
Prior Authorization filed   Key: [id number]

## 2016-01-24 ENCOUNTER — Telehealth: Payer: Self-pay | Admitting: General Practice

## 2016-01-24 LAB — CULTURE, BLOOD (ROUTINE X 2)
CULTURE: NO GROWTH
Culture: NO GROWTH
Culture: NO GROWTH
Culture: NO GROWTH

## 2016-01-24 NOTE — Telephone Encounter (Signed)
1st attempt at [facility] call.  Patient needs Hospital follow up appointment with Dr. Sarajane Jews within 2 weeks.

## 2016-01-27 ENCOUNTER — Telehealth: Payer: Self-pay

## 2016-01-27 NOTE — Telephone Encounter (Signed)
[facility] pharmacy also sent a prior authorization request for Testosterone (ANDROGEL) 20.25 MG/1.25GM (1.62%) GEL  Key: [id number]

## 2016-01-30 ENCOUNTER — Encounter: Payer: Self-pay | Admitting: Family Medicine

## 2016-01-30 ENCOUNTER — Telehealth: Payer: Self-pay

## 2016-01-30 ENCOUNTER — Ambulatory Visit (INDEPENDENT_AMBULATORY_CARE_PROVIDER_SITE_OTHER): Payer: Medicare Other | Admitting: Family Medicine

## 2016-01-30 VITALS — BP 145/101 | HR 103 | Temp 98.9°F | Ht 72.0 in | Wt 227.0 lb

## 2016-01-30 DIAGNOSIS — N452 Orchitis: Secondary | ICD-10-CM

## 2016-01-30 DIAGNOSIS — E538 Deficiency of other specified B group vitamins: Secondary | ICD-10-CM

## 2016-01-30 DIAGNOSIS — N451 Epididymitis: Secondary | ICD-10-CM

## 2016-01-30 DIAGNOSIS — N390 Urinary tract infection, site not specified: Secondary | ICD-10-CM | POA: Diagnosis not present

## 2016-01-30 MED ORDER — CYANOCOBALAMIN 1000 MCG/ML IJ SOLN
1000.0000 ug | Freq: Once | INTRAMUSCULAR | Status: AC
  Administered 2016-01-30: 1000 ug via INTRAMUSCULAR

## 2016-01-30 NOTE — Telephone Encounter (Signed)
Note filed in chart. Original PA was denied. I did file an appeal this afternoon.

## 2016-01-30 NOTE — Progress Notes (Signed)
Pre visit review using our clinic review tool, if applicable. No additional management support is needed unless otherwise documented below in the visit note. 

## 2016-01-30 NOTE — Telephone Encounter (Signed)
Pt has been scheduled.  °

## 2016-01-30 NOTE — Telephone Encounter (Signed)
Prior Authorization for Testosterone (ANDROGEL) 20.25 MG/1.25GM (1.62%) GEL denial received.   Androgel is approved when the member has a low total serum testosterone (or less than 300) OR a low free serum testosterone level. In this case, testosterone levels have not been submitted.  Call placed to [place] at [phone]. Spoke with someone in appeals and filed an appeal.  Reference number for appeal is: [id number]

## 2016-01-30 NOTE — Telephone Encounter (Signed)
Have you heard anything about this prior auth/ pt is calling to follow up.

## 2016-01-30 NOTE — Progress Notes (Signed)
   Subjective:    Patient ID: Calvin [name], male    DOB: 1946/03/10, 70 y.o.   [name]: [id number]  HPI Here to follow up a hospital stay from 01-19-16 to 01-21-16 for a UTI with epididymitis-orchitis due to E coli. He was given IV Levaquin and was sent home with a 21 day supply of oral Levaquin. He feels much better now with no pain and no scrotal swelling. He had a CT scan of the pelvis which was normal and showed no torsion. He is scheduled to follow up with Urology next week. His only complaint today is of chronic fatigue, the same fatigue we discussed at his recent cpx. His labs that day were unremarkable although his B12 level was at the very low end of normal. He asks if B12 shots would help him feel better.    Review of Systems  Constitutional: Positive for fatigue.  Respiratory: Negative.   Cardiovascular: Negative.   Gastrointestinal: Negative.   Genitourinary: Negative.        Objective:   Physical Exam  Constitutional: He is oriented to person, place, and time. He appears well-developed and well-nourished.  Neck: No thyromegaly present.  Cardiovascular: Normal rate, regular rhythm, normal heart sounds and intact distal pulses.   Pulmonary/Chest: Effort normal and breath sounds normal.  Genitourinary:  Genitourinary Comments: Both testicles are normal with no swelling and no tenderness   Lymphadenopathy:    He has no cervical adenopathy.  Neurological: He is alert and oriented to person, place, and time.          Assessment & Plan:  He is recovering from a recent UTI with epididymitis and orchitis. He will finish out the [place]. See Urology as above. He is given a B12 shot today and he will let us know how he feels in one week

## 2016-01-31 NOTE — Telephone Encounter (Signed)
Left a very detailed voicemail for Yvone Neu with [employer] regarding testosterone levels. Provided my direct extension for any questions.

## 2016-01-31 NOTE — Telephone Encounter (Signed)
Calvin [name] w/ [employer] appeals states he just needs the number or the testosterone level.' It was not clear on the form.  Please give  [phone]

## 2016-02-06 ENCOUNTER — Ambulatory Visit: Payer: Medicare Other | Admitting: Family Medicine

## 2016-03-02 ENCOUNTER — Telehealth: Payer: Self-pay

## 2016-03-02 NOTE — Telephone Encounter (Signed)
Faxed appeal papers to insurance.

## 2016-08-22 ENCOUNTER — Encounter: Payer: Self-pay | Admitting: Family Medicine

## 2017-01-07 ENCOUNTER — Ambulatory Visit (INDEPENDENT_AMBULATORY_CARE_PROVIDER_SITE_OTHER): Payer: Medicare Other | Admitting: Family Medicine

## 2017-01-07 ENCOUNTER — Encounter: Payer: Self-pay | Admitting: Family Medicine

## 2017-01-07 VITALS — BP 150/120 | HR 98 | Temp 98.5°F | Ht 72.0 in | Wt 226.0 lb

## 2017-01-07 DIAGNOSIS — N3001 Acute cystitis with hematuria: Secondary | ICD-10-CM

## 2017-01-07 DIAGNOSIS — I10 Essential (primary) hypertension: Secondary | ICD-10-CM | POA: Diagnosis not present

## 2017-01-07 LAB — POC URINALSYSI DIPSTICK (AUTOMATED)
PH UA: 5 (ref 5.0–8.0)
Spec Grav, UA: 1.025 (ref 1.010–1.025)
Urobilinogen, UA: 2 E.U./dL — AB

## 2017-01-07 MED ORDER — AMLODIPINE BESYLATE 5 MG PO TABS: 5.0000 mg | ORAL_TABLET | Freq: Every day | ORAL | 3 refills | Status: DC

## 2017-01-07 MED ORDER — LEVOFLOXACIN 500 MG PO TABS: 500.0000 mg | ORAL_TABLET | Freq: Every day | ORAL | 0 refills | Status: AC

## 2017-01-07 NOTE — Progress Notes (Signed)
   Subjective:    Patient ID: Calvin [name], male    DOB: 1945-12-26, 71 y.o.   [name]: [id number]  HPI Here for several issues. First his BP has been increasing for several months. He takes Losartan HCT as usual, but lately the BP at home has run in the 150s or 160s over 90s. No chest pain or SOB. Also 4 days ago he began to have blood in the urine. No urgency or pain, no fever.    Review of Systems  Constitutional: Negative.   Respiratory: Negative.   Cardiovascular: Negative.   Gastrointestinal: Negative.   Genitourinary: Positive for hematuria. Negative for dysuria, frequency and urgency.  Neurological: Negative.        Objective:   Physical Exam  Constitutional: He is oriented to person, place, and time. He appears well-developed and well-nourished. No distress.  Cardiovascular: Normal rate, regular rhythm, normal heart sounds and intact distal pulses.   Pulmonary/Chest: Effort normal and breath sounds normal. No respiratory distress. He has no wheezes. He has no rales.  Abdominal: Soft. Bowel sounds are normal. He exhibits no distension and no mass. There is no tenderness. There is no rebound and no guarding.  Musculoskeletal:  Trace ankle edema   Neurological: He is alert and oriented to person, place, and time.          Assessment & Plan:  For the HTN we will add Amlodipine 5 mg daily. Recheck in 3 weeks. Treat the UTI with Levaquin. Drink plenty of water. Culture the sample. Recheck at his well exam later this week.  Alysia Penna, MD

## 2017-01-07 NOTE — Patient Instructions (Signed)
WE NOW OFFER   [facility] FAST TRACK!!!  SAME DAY Appointments for ACUTE CARE  Such as: Sprains, Injuries, cuts, abrasions, rashes, muscle pain, joint pain, back pain Colds, flu, sore throats, headache, allergies, cough, fever  Ear pain, sinus and eye infections Abdominal pain, nausea, vomiting, diarrhea, upset stomach Animal/insect bites  3 Easy Ways to Schedule: Walk-In Scheduling Call in scheduling Mychart Sign-up: [email or web address]         

## 2017-01-09 ENCOUNTER — Encounter: Payer: Self-pay | Admitting: Family Medicine

## 2017-01-09 ENCOUNTER — Ambulatory Visit (INDEPENDENT_AMBULATORY_CARE_PROVIDER_SITE_OTHER): Payer: Medicare Other | Admitting: Family Medicine

## 2017-01-09 VITALS — BP 130/98 | HR 97 | Temp 98.7°F | Ht 72.0 in | Wt 223.0 lb

## 2017-01-09 DIAGNOSIS — Z23 Encounter for immunization: Secondary | ICD-10-CM | POA: Diagnosis not present

## 2017-01-09 DIAGNOSIS — I10 Essential (primary) hypertension: Secondary | ICD-10-CM

## 2017-01-09 DIAGNOSIS — N138 Other obstructive and reflux uropathy: Secondary | ICD-10-CM | POA: Diagnosis not present

## 2017-01-09 DIAGNOSIS — F320 Major depressive disorder, single episode, mild: Secondary | ICD-10-CM

## 2017-01-09 DIAGNOSIS — N3001 Acute cystitis with hematuria: Secondary | ICD-10-CM | POA: Diagnosis not present

## 2017-01-09 DIAGNOSIS — F5101 Primary insomnia: Secondary | ICD-10-CM

## 2017-01-09 DIAGNOSIS — N401 Enlarged prostate with lower urinary tract symptoms: Secondary | ICD-10-CM

## 2017-01-09 DIAGNOSIS — G4489 Other headache syndrome: Secondary | ICD-10-CM | POA: Diagnosis not present

## 2017-01-09 LAB — URINE CULTURE: ORGANISM ID, BACTERIA: NO GROWTH

## 2017-01-09 MED ORDER — FINASTERIDE 5 MG PO TABS: 5.0000 mg | ORAL_TABLET | Freq: Every day | ORAL | 5 refills | Status: DC

## 2017-01-09 NOTE — Addendum Note (Signed)
Addended by: Aggie Hacker A on: 01/09/2017 04:03 PM   Modules accepted: Orders

## 2017-01-09 NOTE — Progress Notes (Signed)
   Subjective:    Patient ID: Calvin [name], male    DOB: 1946-03-03, 71 y.o.   [name]: [id number]  HPI Here to follow up on some issues. We saw him 2 days ago for a UTI and he was started on Levaquin. He feels a little better and he has seen no more blood in the urine. His urine culture revealed no growth, interestingly. He has been dealing with BPH problems like frequency to urinate and a slow stream for several years. Has been taking 0.4 mg of Flomax but this has not helped. His BP has been borderline. His depression has been stable. He was started on Amlodipine 2 days ago for HTN.    Review of Systems  Constitutional: Negative.   HENT: Negative.   Eyes: Negative.   Respiratory: Negative.   Cardiovascular: Negative.   Gastrointestinal: Negative.   Genitourinary: Positive for difficulty urinating and frequency. Negative for dysuria, flank pain, hematuria, penile swelling, scrotal swelling, testicular pain and urgency.  Musculoskeletal: Negative.   Skin: Negative.   Neurological: Negative.   Psychiatric/Behavioral: Negative.        Objective:   Physical Exam  Constitutional: He is oriented to person, place, and time. He appears well-developed and well-nourished. No distress.  HENT:  Head: Normocephalic and atraumatic.  Right Ear: External ear normal.  Left Ear: External ear normal.  Nose: Nose normal.  Mouth/Throat: Oropharynx is clear and moist. No oropharyngeal exudate.  Eyes: Pupils are equal, round, and reactive to light. Conjunctivae and EOM are normal. Right eye exhibits no discharge. Left eye exhibits no discharge. No scleral icterus.  Neck: Neck supple. No JVD present. No tracheal deviation present. No thyromegaly present.  Cardiovascular: Normal rate, regular rhythm, normal heart sounds and intact distal pulses.  Exam reveals no gallop and no friction rub.   No murmur heard. Pulmonary/Chest: Effort normal and breath sounds normal. No respiratory distress. He has no wheezes.  He has no rales. He exhibits no tenderness.  Abdominal: Soft. Bowel sounds are normal. He exhibits no distension and no mass. There is no tenderness. There is no rebound and no guarding.  Genitourinary: Rectum normal and penis normal. Rectal exam shows guaiac negative stool. No penile tenderness.  Genitourinary Comments: Prostate is moderately enlarged and firm, not tender   Musculoskeletal: Normal range of motion. He exhibits no edema or tenderness.  Lymphadenopathy:    He has no cervical adenopathy.  Neurological: He is alert and oriented to person, place, and time. He has normal reflexes. No cranial nerve deficit. He exhibits normal muscle tone. Coordination normal.  Skin: Skin is warm and dry. No rash noted. He is not diaphoretic. No erythema. No pallor.  Psychiatric: He has a normal mood and affect. His behavior is normal. Judgment and thought content normal.          Assessment & Plan:  He is being treated for a UTI, so he will finish out the Levaquin. Get fasting labs today to check his lipids etc. His BPH is not responding to Flomax so we will stop this and try Proscar 5mg  daily. His depression is stable. We will watch his BP over the next few weeks as the Amlodipine kicks in.  Alysia Penna, MD

## 2017-01-09 NOTE — Patient Instructions (Signed)
WE NOW OFFER   [facility] FAST TRACK!!!  SAME DAY Appointments for ACUTE CARE  Such as: Sprains, Injuries, cuts, abrasions, rashes, muscle pain, joint pain, back pain Colds, flu, sore throats, headache, allergies, cough, fever  Ear pain, sinus and eye infections Abdominal pain, nausea, vomiting, diarrhea, upset stomach Animal/insect bites  3 Easy Ways to Schedule: Walk-In Scheduling Call in scheduling Mychart Sign-up: [email or web address]         

## 2017-01-10 LAB — CBC WITH DIFFERENTIAL/PLATELET
BASOS PCT: 0.5 % (ref 0.0–3.0)
Basophils Absolute: 0 10*3/uL (ref 0.0–0.1)
EOS ABS: 0.1 10*3/uL (ref 0.0–0.7)
Eosinophils Relative: 1.5 % (ref 0.0–5.0)
HEMATOCRIT: 45.9 % (ref 39.0–52.0)
Hemoglobin: 15.8 g/dL (ref 13.0–17.0)
Lymphocytes Relative: 22.6 % (ref 12.0–46.0)
Lymphs Abs: 2 10*3/uL (ref 0.7–4.0)
MCHC: 34.4 g/dL (ref 30.0–36.0)
MCV: 103.9 fl — ABNORMAL HIGH (ref 78.0–100.0)
MONO ABS: 0.9 10*3/uL (ref 0.1–1.0)
Monocytes Relative: 9.8 % (ref 3.0–12.0)
NEUTROS ABS: 5.8 10*3/uL (ref 1.4–7.7)
Neutrophils Relative %: 65.6 % (ref 43.0–77.0)
PLATELETS: 242 10*3/uL (ref 150.0–400.0)
RBC: 4.42 Mil/uL (ref 4.22–5.81)
RDW: 13.1 % (ref 11.5–15.5)
WBC: 8.9 10*3/uL (ref 4.0–10.5)

## 2017-01-10 LAB — BASIC METABOLIC PANEL
BUN: 13 mg/dL (ref 6–23)
CHLORIDE: 100 meq/L (ref 96–112)
CO2: 28 mEq/L (ref 19–32)
Calcium: 10.2 mg/dL (ref 8.4–10.5)
Creatinine, Ser: 0.91 mg/dL (ref 0.40–1.50)
GFR: 87.67 mL/min (ref >60.00)
GLUCOSE: 106 mg/dL — AB (ref 70–99)
Potassium: 3.5 mEq/L (ref 3.5–5.1)
SODIUM: 138 meq/L (ref 135–145)

## 2017-01-10 LAB — LIPID PANEL
CHOLESTEROL: 186 mg/dL (ref 0–200)
HDL: 54.2 mg/dL (ref >39.00)
LDL Cholesterol: 113 mg/dL — ABNORMAL HIGH (ref 0–99)
NonHDL: 131.91
Total CHOL/HDL Ratio: 3
Triglycerides: 96 mg/dL (ref 0.0–149.0)
VLDL: 19.2 mg/dL (ref 0.0–40.0)

## 2017-01-10 LAB — TSH: TSH: 3.59 u[IU]/mL (ref 0.35–4.50)

## 2017-01-10 LAB — HEPATIC FUNCTION PANEL
ALBUMIN: 4.7 g/dL (ref 3.5–5.2)
ALK PHOS: 50 U/L (ref 39–117)
ALT: 62 U/L — ABNORMAL HIGH (ref 0–53)
AST: 37 U/L (ref 0–37)
Bilirubin, Direct: 0.3 mg/dL (ref 0.0–0.3)
Total Bilirubin: 1.7 mg/dL — ABNORMAL HIGH (ref 0.2–1.2)
Total Protein: 7.2 g/dL (ref 6.0–8.3)

## 2017-01-10 LAB — PSA: PSA: 2.63 ng/mL (ref 0.10–4.00)

## 2017-01-25 ENCOUNTER — Encounter: Payer: Self-pay | Admitting: Family Medicine

## 2017-01-25 ENCOUNTER — Ambulatory Visit (INDEPENDENT_AMBULATORY_CARE_PROVIDER_SITE_OTHER): Payer: Medicare Other | Admitting: Family Medicine

## 2017-01-25 VITALS — BP 124/92 | HR 100 | Temp 98.7°F | Ht 72.0 in | Wt 223.0 lb

## 2017-01-25 DIAGNOSIS — G629 Polyneuropathy, unspecified: Secondary | ICD-10-CM | POA: Diagnosis not present

## 2017-01-25 MED ORDER — GABAPENTIN 100 MG PO CAPS: 100.0000 mg | ORAL_CAPSULE | Freq: Three times a day (TID) | ORAL | 3 refills | Status: DC

## 2017-01-25 NOTE — Patient Instructions (Signed)
WE NOW OFFER   [facility] FAST TRACK!!!  SAME DAY Appointments for ACUTE CARE  Such as: Sprains, Injuries, cuts, abrasions, rashes, muscle pain, joint pain, back pain Colds, flu, sore throats, headache, allergies, cough, fever  Ear pain, sinus and eye infections Abdominal pain, nausea, vomiting, diarrhea, upset stomach Animal/insect bites  3 Easy Ways to Schedule: Walk-In Scheduling Call in scheduling Mychart Sign-up: [email or web address]         

## 2017-01-25 NOTE — Progress Notes (Signed)
   Subjective:    Patient ID: Calvin [name], male    DOB: 08/29/1945, 71 y.o.   [name]: [id number]  HPI Here to discuss a pattern of widespread fleeting pains over his body that started about 2 weeks ago. These are sharp in nature and only last a second or two. They often involve the left ear but can also involve either arm or either leg, and even the trunk. No numbness or tingling, no weakness. No recent medication changes.   Review of Systems  Constitutional: Negative.   Respiratory: Negative.   Cardiovascular: Negative.   Neurological: Negative for dizziness, tremors, seizures, syncope, facial asymmetry, speech difficulty, weakness, light-headedness, numbness and headaches.       Objective:   Physical Exam  Constitutional: He is oriented to person, place, and time. He appears well-developed and well-nourished.  Cardiovascular: Normal rate, regular rhythm, normal heart sounds and intact distal pulses.   Pulmonary/Chest: Effort normal and breath sounds normal. No respiratory distress. He has no wheezes. He has no rales.  Neurological: He is alert and oriented to person, place, and time. No cranial nerve deficit. Coordination normal.          Assessment & Plan:  Scattered pains which sound neurologic in nature, possibly a neuropathy of some sort. He had labs recently that were unremarkable. He will try Gabapentin 100 mg tid.  Alysia Penna, MD

## 2017-03-23 ENCOUNTER — Other Ambulatory Visit: Payer: Self-pay | Admitting: Family Medicine

## 2017-03-29 ENCOUNTER — Other Ambulatory Visit: Payer: Self-pay | Admitting: Family Medicine

## 2017-03-29 NOTE — Telephone Encounter (Signed)
Did not see on current medication list?

## 2017-04-02 ENCOUNTER — Other Ambulatory Visit: Payer: Self-pay | Admitting: Family Medicine

## 2017-04-04 ENCOUNTER — Ambulatory Visit (INDEPENDENT_AMBULATORY_CARE_PROVIDER_SITE_OTHER): Payer: Medicare Other | Admitting: *Deleted

## 2017-04-04 DIAGNOSIS — Z23 Encounter for immunization: Secondary | ICD-10-CM

## 2017-05-05 ENCOUNTER — Other Ambulatory Visit: Payer: Self-pay | Admitting: Family Medicine

## 2017-05-06 ENCOUNTER — Other Ambulatory Visit: Payer: Self-pay | Admitting: Family Medicine

## 2017-07-05 ENCOUNTER — Other Ambulatory Visit: Payer: Self-pay | Admitting: Family Medicine

## 2017-07-05 NOTE — Telephone Encounter (Signed)
Last OV 01/25/2017   Last refilled 01/09/2017 disp 30 with 5 refills   Rx sent

## 2017-08-05 ENCOUNTER — Other Ambulatory Visit: Payer: Self-pay | Admitting: Family Medicine

## 2017-08-12 ENCOUNTER — Other Ambulatory Visit: Payer: Self-pay | Admitting: Family Medicine

## 2017-08-28 ENCOUNTER — Ambulatory Visit: Payer: Self-pay | Admitting: Family Medicine

## 2017-08-28 DIAGNOSIS — Z0289 Encounter for other administrative examinations: Secondary | ICD-10-CM

## 2017-09-18 ENCOUNTER — Telehealth: Payer: Self-pay

## 2017-09-18 ENCOUNTER — Ambulatory Visit (INDEPENDENT_AMBULATORY_CARE_PROVIDER_SITE_OTHER): Payer: PPO

## 2017-09-18 VITALS — BP 156/86 | HR 97 | Ht 72.0 in | Wt 235.0 lb

## 2017-09-18 DIAGNOSIS — Z1159 Encounter for screening for other viral diseases: Secondary | ICD-10-CM | POA: Diagnosis not present

## 2017-09-18 DIAGNOSIS — Z0000 Encounter for general adult medical examination without abnormal findings: Secondary | ICD-10-CM | POA: Diagnosis not present

## 2017-09-18 MED ORDER — LOSARTAN POTASSIUM 100 MG PO TABS: 100.0000 mg | ORAL_TABLET | Freq: Every day | ORAL | 3 refills | Status: DC

## 2017-09-18 MED ORDER — HYDROCHLOROTHIAZIDE 25 MG PO TABS: 25.0000 mg | ORAL_TABLET | Freq: Every day | ORAL | 3 refills | Status: DC

## 2017-09-18 NOTE — Telephone Encounter (Signed)
Rx for losartan-HCTZ is on back order will send in Rx separately   Rx;s have been sent per the OK verbal by Dr. Sarajane Jews

## 2017-09-18 NOTE — Progress Notes (Signed)
Subjective:   Calvin [name] is a 72 y.o. male who presents for Medicare Annual/Subsequent preventive examination.  Reports health as good   Retired from [employer], medical some time in [place] 01/2017 Annual in July and will schedule with Calvin [name]    Diet Chol/hdl 3; chol 186; hdl 54; trig 96   Exercise Goes to the gym Silver sneaker at times    ETOH - 4 fifths a week  Thought he can take med to help him quit Took medication depression at one time but he had a "bad" reaction Thinking about quitting Goes to bars and would miss this  Feels he doesn't drink so much that he can't quit  Denies getting inebriated   States he is thinking he needs to quit due to his weight gain. Also worries about his liver    Agrees to try to watch his diet Will start back to the gym in [place] and in [place] Will cut back on his drinking Can make an apt with Calvin [name] if he feels would like to discuss medication that may assist him to slow down on his drinking  Denies alcoholism  Denies depression   Dental work ongoing at times   Tobacco negative   Health Maintenance Due  Topic Date Due  . Hepatitis C Screening  25-Aug-1945   Agrees to hep c next blood draw Colonoscopy 03/2010 due in 03/2020 Educated regarding shingrix  Will need 2nd PSV 23 in July of this year   Cardiac Risk Factors include: advanced age (>22men, >47 women);hypertension;male gender;smoking/ tobacco exposure     Objective:    Vitals: BP (!) 156/86   Pulse 97   Ht 6' (1.829 m)   Wt 235 lb (106.6 kg)   SpO2 97%   BMI 31.87 kg/m   Body mass index is 31.87 kg/m.  To note, did not have his losartan/hctz due to recall CMA followed up and ordered Losartan and HCTZ separately. Education provided for the patient.  Advanced Directives 09/18/2017 01/19/2016 09/22/2015 03/12/2012  Does Patient Have a Medical Advance Directive? Yes Yes No Patient does not have advance directive  Type of  Advance Directive - Living will;Healthcare Power of Attorney - -  Would patient like information on creating a medical advance directive? - - No - patient declined information -  Pre-existing out of facility DNR order (yellow form or pink MOST form) - - - No    Tobacco Social History   Tobacco Use  Smoking Status Never Smoker  Smokeless Tobacco Never Used     Counseling given: Yes   Clinical Intake:    Past Medical History:  Diagnosis Date  . Benign prostatic hypertrophy   . ED (erectile dysfunction)   . Headache(784.0)   . Hypertension   . Hypogonadism male   . Nephrolithiasis    hx of had lithotripsy in 1-10.   Past Surgical History:  Procedure Laterality Date  . colonoscopy  03-28-10   per Dr. Fuller Plan, hyperplastic polyps, repeat in 10 yrs   . LAPAROSCOPIC APPENDECTOMY  03/12/2012   Procedure: APPENDECTOMY LAPAROSCOPIC;  Surgeon: Harl Bowie, MD;  Location: [employer];  Service: General;  Laterality: N/A;  . Levert Feinstein HEMORRHOIDECTOMY  04-14-10   per Dr. Johney Maine   . TONSILLECTOMY     Family History  Problem Relation Age of Onset  . Hypertension Other   . Stroke Other   . Heart disease Other  rheumatic   . Coronary artery disease Other    Social History   Socioeconomic History  . Marital status: Widowed    Spouse name: Not on file  . Number of children: Not on file  . Years of education: Not on file  . Highest education level: Not on file  Occupational History  . Not on file  Social Needs  . Financial resource strain: Not on file  . Food insecurity:    Worry: Not on file    Inability: Not on file  . Transportation needs:    Medical: Not on file    Non-medical: Not on file  Tobacco Use  . Smoking status: Never Smoker  . Smokeless tobacco: Never Used  Substance and Sexual Activity  . Alcohol use: Yes    Alcohol/week: 0.0 oz    Comment: "to much" drinks vodka   . Drug use: No  . Sexual activity: Not on file  Lifestyle  . Physical activity:     Days per week: Not on file    Minutes per session: Not on file  . Stress: Not on file  Relationships  . Social connections:    Talks on phone: Not on file    Gets together: Not on file    Attends religious service: Not on file    Active member of club or organization: Not on file    Attends meetings of clubs or organizations: Not on file    Relationship status: Not on file  Other Topics Concern  . Not on file  Social History Narrative  . Not on file    Outpatient Encounter Medications as of 09/18/2017  Medication Sig  . amLODipine (NORVASC) 5 MG tablet TAKE 1 TABLET(5 MG) BY MOUTH DAILY  . doxycycline (MONODOX) 50 MG capsule Take 50 mg by mouth daily.   . finasteride (PROSCAR) 5 MG tablet TAKE 1 TABLET(5 MG) BY MOUTH DAILY  . losartan-hydrochlorothiazide (HYZAAR) 100-25 MG tablet TAKE 1 TABLET BY MOUTH EVERY DAY  . tamsulosin (FLOMAX) 0.4 MG CAPS capsule TAKE ONE CAPSULE BY MOUTH DAILY  . Coenzyme Q10 (CO Q-10) 100 MG CAPS Take by mouth daily.  Marland Kitchen gabapentin (NEURONTIN) 100 MG capsule Take 1 capsule (100 mg total) by mouth 3 (three) times daily. (Patient not taking: Reported on 09/18/2017)   No facility-administered encounter medications on file as of 09/18/2017.     Activities of Daily Living In your present state of health, do you have any difficulty performing the following activities: 09/18/2017  Hearing? N  Vision? N  Difficulty concentrating or making decisions? N  Walking or climbing stairs? N  Dressing or bathing? N  Doing errands, shopping? N  Preparing Food and eating ? N  Using the Toilet? N  In the past six months, have you accidently leaked urine? Y  Do you have problems with loss of bowel control? N  Managing your Medications? N  Managing your Finances? N  Housekeeping or managing your Housekeeping? N  Some recent data might be hidden    Patient Care Team: Laurey Morale, MD as PCP - General   Assessment:   This is a routine wellness examination for  Calvin [name].  Exercise Activities and Dietary recommendations Current Exercise Habits: Structured exercise class, Type of exercise: strength training/weights, Frequency (Times/Week): 5, Intensity: Moderate  Goals    . Exercise 150 min/wk Moderate Activity     Will join the gym in [place] !    . Patient Stated     Stop  drinking at least cut it back quit a bit Try to do some of the things you feel will be helpful Go back to the gym here and in [employer]         Fall Risk Fall Risk  09/01/2014  Falls in the past year? No     Depression Screen PHQ 2/9 Scores 09/18/2017  PHQ - 2 Score 0    Cognitive Function MMSE - Mini Mental State Exam 09/18/2017  Not completed: (No Data)   Ad8 score reviewed for issues:  Issues making decisions:  Less interest in hobbies / activities:  Repeats questions, stories (family complaining):  Trouble using ordinary gadgets (microwave, computer, phone):  Forgets the month or year:   Mismanaging finances:   Remembering appts:  Daily problems with thinking and/or memory: Ad8 score is=0      Immunization History  Administered Date(s) Administered  . Influenza Split 02/28/2011, 06/15/2012  . Influenza Whole 03/27/2009, 07/19/2010  . Influenza, High Dose Seasonal PF 07/20/2014, 02/24/2015, 04/04/2017  . Influenza,inj,Quad PF,6+ Mos 06/26/2013  . Pneumococcal Conjugate-13 01/09/2017  . Tdap 09/22/2015    Screening Tests Health Maintenance  Topic Date Due  . Hepatitis C Screening  01-12-1946  . PNA vac Low Risk Adult (2 of 2 - PPSV23) 01/09/2018  . INFLUENZA VACCINE  01/16/2018  . COLONOSCOPY  03/28/2020  . TETANUS/TDAP  09/21/2025        Plan:      PCP Notes   Health Maintenance Agrees to hep c next blood draw Colonoscopy 03/2010 due in 03/2020 Educated regarding shingrix  Will need 2nd PSV 23 in July of this year   Abnormal Screens  ETOH - 4 fifths a week  Agrees to try to watch his diet Will start  back to the gym in [place] and in [place] Will cut back on his drinking Can make an apt with Calvin [name] if he feels would like to discuss medication that may assist him to slow down on his drinking  Denies alcoholism  Denies depression     Referrals  none  Patient concerns; To note, did not have his losartan/hctz due to recall CMA followed up and ordered Losartan and HCTZ separately. Education provided for the patient.    Nurse Concerns; As  Noted   Next PCP apt Will schedule in July or come sooner as needed     I have personally reviewed and noted the following in the patient's chart:   . Medical and social history . Use of alcohol, tobacco or illicit drugs  . Current medications and supplements . Functional ability and status . Nutritional status . Physical activity . Advanced directives . List of other physicians . Hospitalizations, surgeries, and ER visits in previous 12 months . Vitals . Screenings to include cognitive, depression, and falls . Referrals and appointments  In addition, I have reviewed and discussed with patient certain preventive protocols, quality metrics, and best practice recommendations. A written personalized care plan for preventive services as well as general preventive health recommendations were provided to patient.     Wynetta Fines, RN  09/18/2017

## 2017-09-18 NOTE — Patient Instructions (Addendum)
Calvin [name] , Thank you for taking time to come for your Medicare Wellness Visit. I appreciate your ongoing commitment to your health goals. Please review the following plan we discussed and let me know if I can assist you in the future.   Will schedule your annual labs and medical office visit with Dr. Sarajane Jews after July 25   Make an apt anytime to discuss other   Will check Hep C at your next blood draw Will need a 2nd pneumonia vaccine and this will be it The Centers for Disease Control are now recommending 2 pneumonia vaccinations after 75. The first is the Prevnar 13. This helps to boost your immunity to community acquired pneumonia as well as some protection from bacterial pneumonia  The 2nd is the pneumovax 23, which offers more broad protection!  Please consider taking these as this is your best protection against pneumonia.  BP med with fluid pill recalled; Dr. Sarajane Jews is calling in med; please keep an eye on your bp since it is elevated today but has not had his Losartan/hctz (CMA ordered Losartan and HCTZ separately. This was explained to Calvin [name] and he will pick this up today   These are the goals we discussed: Goals    . Patient Stated     Stop drinking at least cut it back quit a bit Try to do some of the things you feel will be helpful Go back to the gym here and in [employer]         This is a list of the screening recommended for you and due dates:  Health Maintenance  Topic Date Due  .  Hepatitis C: One time screening is recommended by Center for Disease Control  (CDC) for  adults born from [birth year] through 1963.   1946/06/14  . Pneumonia vaccines (2 of 2 - PPSV23) 01/09/2018  . Flu Shot  01/16/2018  . Colon Cancer Screening  03/28/2020  . Tetanus Vaccine  09/21/2025     Fall Prevention in the Home Falls can cause injuries. They can happen to people of all ages. There are many things you can do to make your home safe and to help prevent falls. What can  I do on the outside of my home?  Regularly fix the edges of walkways and driveways and fix any cracks.  Remove anything that might make you trip as you walk through a door, such as a raised step or threshold.  Trim any bushes or trees on the path to your home.  Use bright outdoor lighting.  Clear any walking paths of anything that might make someone trip, such as rocks or tools.  Regularly check to see if handrails are loose or broken. Make sure that both sides of any steps have handrails.  Any raised decks and porches should have guardrails on the edges.  Have any leaves, snow, or ice cleared regularly.  Use sand or salt on walking paths during winter.  Clean up any spills in your garage right away. This includes oil or grease spills. What can I do in the bathroom?  Use night lights.  Install grab bars by the toilet and in the tub and shower. Do not use towel bars as grab bars.  Use non-skid mats or decals in the tub or shower.  If you need to sit down in the shower, use a plastic, non-slip stool.  Keep the floor dry. Clean up any water that spills on the floor  as soon as it happens.  Remove soap buildup in the tub or shower regularly.  Attach bath mats securely with double-sided non-slip rug tape.  Do not have throw rugs and other things on the floor that can make you trip. What can I do in the bedroom?  Use night lights.  Make sure that you have a light by your bed that is easy to reach.  Do not use any sheets or blankets that are too big for your bed. They should not hang down onto the floor.  Have a firm chair that has side arms. You can use this for support while you get dressed.  Do not have throw rugs and other things on the floor that can make you trip. What can I do in the kitchen?  Clean up any spills right away.  Avoid walking on wet floors.  Keep items that you use a lot in easy-to-reach places.  If you need to reach something above you, use a  strong step stool that has a grab bar.  Keep electrical cords out of the way.  Do not use floor polish or wax that makes floors slippery. If you must use wax, use non-skid floor wax.  Do not have throw rugs and other things on the floor that can make you trip. What can I do with my stairs?  Do not leave any items on the stairs.  Make sure that there are handrails on both sides of the stairs and use them. Fix handrails that are broken or loose. Make sure that handrails are as long as the stairways.  Check any carpeting to make sure that it is firmly attached to the stairs. Fix any carpet that is loose or worn.  Avoid having throw rugs at the top or bottom of the stairs. If you do have throw rugs, attach them to the floor with carpet tape.  Make sure that you have a light switch at the top of the stairs and the bottom of the stairs. If you do not have them, ask someone to add them for you. What else can I do to help prevent falls?  Wear shoes that: ? Do not have high heels. ? Have rubber bottoms. ? Are comfortable and fit you well. ? Are closed at the toe. Do not wear sandals.  If you use a stepladder: ? Make sure that it is fully opened. Do not climb a closed stepladder. ? Make sure that both sides of the stepladder are locked into place. ? Ask someone to hold it for you, if possible.  Clearly mark and make sure that you can see: ? Any grab bars or handrails. ? First and last steps. ? Where the edge of each step is.  Use tools that help you move around (mobility aids) if they are needed. These include: ? Canes. ? Walkers. ? Scooters. ? Crutches.  Turn on the lights when you go into a dark area. Replace any light bulbs as soon as they burn out.  Set up your furniture so you have a clear path. Avoid moving your furniture around.  If any of your floors are uneven, fix them.  If there are any pets around you, be aware of where they are.  Review your medicines with your  doctor. Some medicines can make you feel dizzy. This can increase your chance of falling. Ask your doctor what other things that you can do to help prevent falls. This information is not intended to replace advice  given to you by your health care provider. Make sure you discuss any questions you have with your health care provider. Document Released: 03/31/2009 Document Revised: 11/10/2015 Document Reviewed: 07/09/2014 Elsevier Interactive Patient Education  2018 [place] Maintenance, Male A healthy lifestyle and preventive care is important for your health and wellness. Ask your health care provider about what schedule of regular examinations is right for you. What should I know about weight and diet? Eat a Healthy Diet  Eat plenty of vegetables, fruits, whole grains, low-fat dairy products, and lean protein.  Do not eat a lot of foods high in solid fats, added sugars, or salt.  Maintain a Healthy Weight Regular exercise can help you achieve or maintain a healthy weight. You should:  Do at least 150 minutes of exercise each week. The exercise should increase your heart rate and make you sweat (moderate-intensity exercise).  Do strength-training exercises at least twice a week.  Watch Your Levels of Cholesterol and Blood Lipids  Have your blood tested for lipids and cholesterol every 5 years starting at 70 years of age. If you are at high risk for heart disease, you should start having your blood tested when you are 70 years old. You may need to have your cholesterol levels checked more often if: ? Your lipid or cholesterol levels are high. ? You are older than 70 years of age. ? You are at high risk for heart disease.  What should I know about cancer screening? Many types of cancers can be detected early and may often be prevented. Lung Cancer  You should be screened every year for lung cancer if: ? You are a current smoker who has smoked for at least 30  years. ? You are a former smoker who has quit within the past 15 years.  Talk to your health care provider about your screening options, when you should start screening, and how often you should be screened.  Colorectal Cancer  Routine colorectal cancer screening usually begins at 70 years of age and should be repeated every 5-10 years until you are 70 years old. You may need to be screened more often if early forms of precancerous polyps or small growths are found. Your health care provider may recommend screening at an earlier age if you have risk factors for colon cancer.  Your health care provider may recommend using home test kits to check for hidden blood in the stool.  A small camera at the end of a tube can be used to examine your colon (sigmoidoscopy or colonoscopy). This checks for the earliest forms of colorectal cancer.  Prostate and Testicular Cancer  Depending on your age and overall health, your health care provider may do certain tests to screen for prostate and testicular cancer.  Talk to your health care provider about any symptoms or concerns you have about testicular or prostate cancer.  Skin Cancer  Check your skin from head to toe regularly.  Tell your health care provider about any new moles or changes in moles, especially if: ? There is a change in a mole's size, shape, or color. ? You have a mole that is larger than a pencil eraser.  Always use sunscreen. Apply sunscreen liberally and repeat throughout the day.  Protect yourself by wearing long sleeves, pants, a wide-brimmed hat, and sunglasses when outside.  What should I know about heart disease, diabetes, and high blood pressure?  If you are 75-24 years of age, have your blood  pressure checked every 3-5 years. If you are 61 years of age or older, have your blood pressure checked every year. You should have your blood pressure measured twice-once when you are at a hospital or clinic, and once when you are  not at a hospital or clinic. Record the average of the two measurements. To check your blood pressure when you are not at a hospital or clinic, you can use: ? An automated blood pressure machine at a pharmacy. ? A home blood pressure monitor.  Talk to your health care provider about your target blood pressure.  If you are between 68-15 years old, ask your health care provider if you should take aspirin to prevent heart disease.  Have regular diabetes screenings by checking your fasting blood sugar level. ? If you are at a normal weight and have a low risk for diabetes, have this test once every three years after the age of 32. ? If you are overweight and have a high risk for diabetes, consider being tested at a younger age or more often.  A one-time screening for abdominal aortic aneurysm (AAA) by ultrasound is recommended for men aged 73-75 years who are current or former smokers. What should I know about preventing infection? Hepatitis B If you have a higher risk for hepatitis B, you should be screened for this virus. Talk with your health care provider to find out if you are at risk for hepatitis B infection. Hepatitis C Blood testing is recommended for:  Everyone born from [birth year] through 1963.  Anyone with known risk factors for hepatitis C.  Sexually Transmitted Diseases (STDs)  You should be screened each year for STDs including gonorrhea and chlamydia if: ? You are sexually active and are younger than 70 years of age. ? You are older than 70 years of age and your health care provider tells you that you are at risk for this type of infection. ? Your sexual activity has changed since you were last screened and you are at an increased risk for chlamydia or gonorrhea. Ask your health care provider if you are at risk.  Talk with your health care provider about whether you are at high risk of being infected with HIV. Your health care provider may recommend a prescription medicine to help  prevent HIV infection.  What else can I do?  Schedule regular health, dental, and eye exams.  Stay current with your vaccines (immunizations).  Do not use any tobacco products, such as cigarettes, chewing tobacco, and e-cigarettes. If you need help quitting, ask your health care provider.  Limit alcohol intake to no more than 2 drinks per day. One drink equals 12 ounces of beer, 5 ounces of wine, or 1 ounces of hard liquor.  Do not use street drugs.  Do not share needles.  Ask your health care provider for help if you need support or information about quitting drugs.  Tell your health care provider if you often feel depressed.  Tell your health care provider if you have ever been abused or do not feel safe at home. This information is not intended to replace advice given to you by your health care provider. Make sure you discuss any questions you have with your health care provider. Document Released: 12/01/2007 Document Revised: 02/01/2016 Document Reviewed: 03/08/2015 Elsevier Interactive Patient Education  [employer].

## 2017-10-04 IMAGING — CT CT ABD-PELV W/ CM
2 of 8 series · 15 of 46 positions shown, 17 images · IV contrast (Omni 300)
Comparison: Scrotal ultrasound obtained earlier today. Abdomen and
pelvis CT dated 03/12/2012 and 01/02/2008.

CLINICAL DATA: Right scrotal pain infection for the past week.
Nausea and vomiting this morning. Previous appendectomy.

EXAM:
CT ABDOMEN AND PELVIS WITH CONTRAST
TECHNIQUE: Multidetector CT imaging of the abdomen and pelvis was performed
using the standard protocol following bolus administration of
intravenous contrast.
CONTRAST:  100mL ZA8U1U-UTT IOPAMIDOL (ZA8U1U-UTT) INJECTION 61%

[Series 2: a/p w/ 5mm · axial · 0.91mm/px · z∈[-387,+123]mm · 12 of 118 slices shown, 14 images]
[im 8/118  soft-tissue]
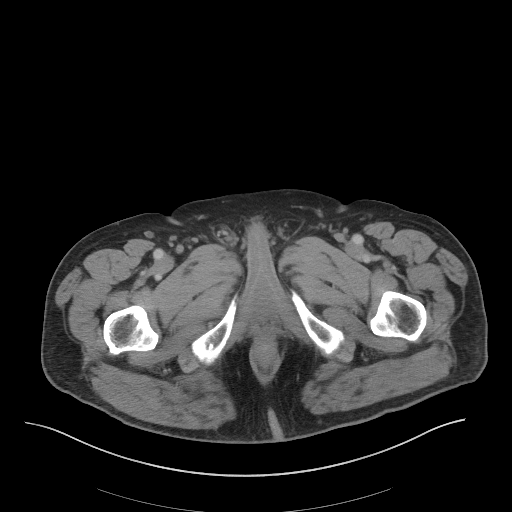
[im 8/118  bone]
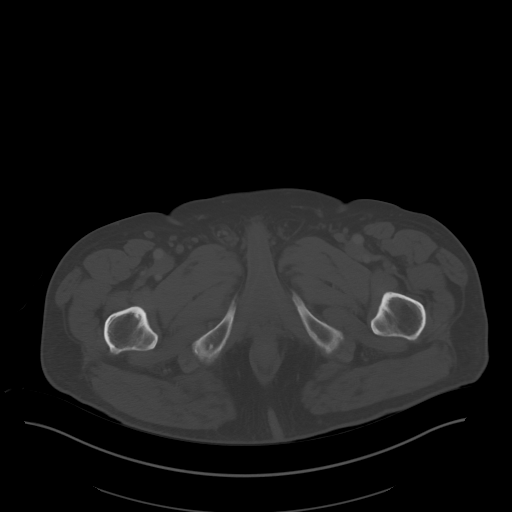
[im 16/118  soft-tissue]
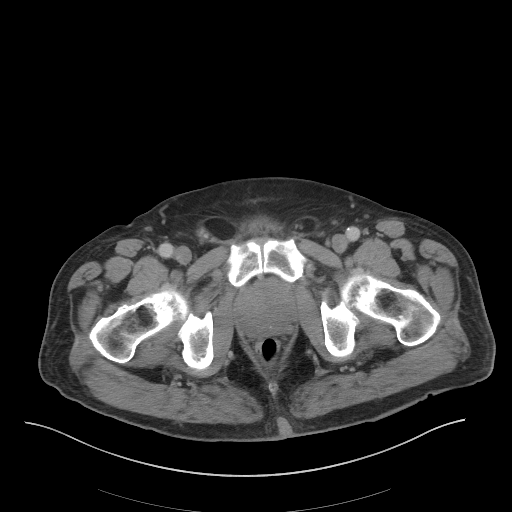
[im 24/118  soft-tissue]
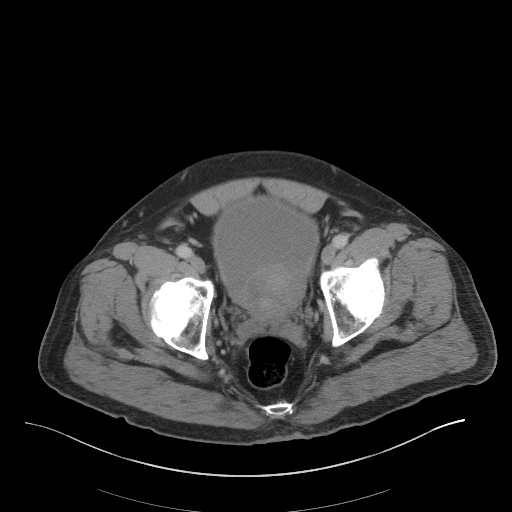
[im 40/118  soft-tissue]
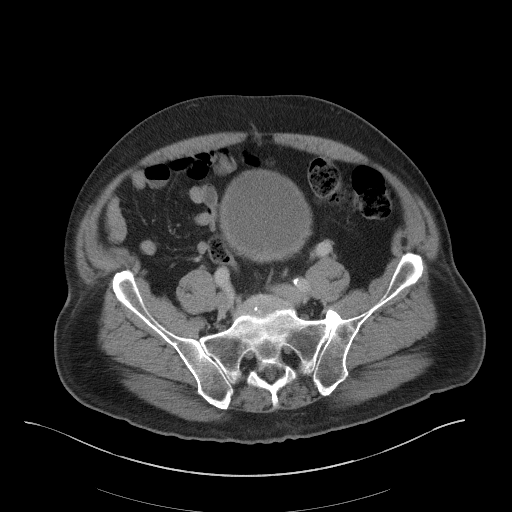
[im 47/118  soft-tissue]
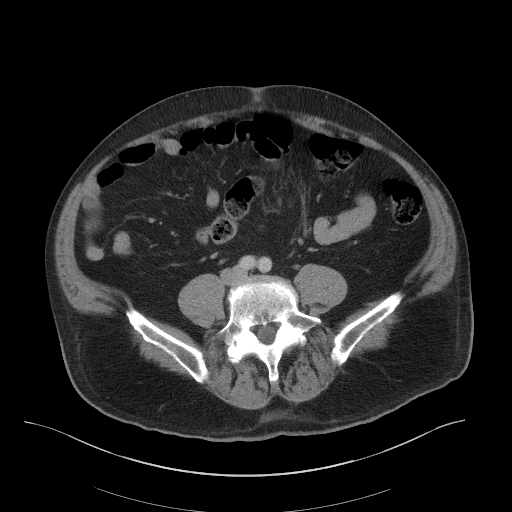
[im 55/118  soft-tissue]
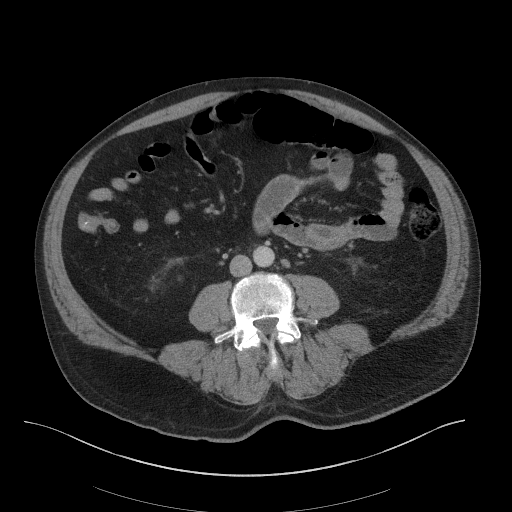
[im 63/118  soft-tissue]
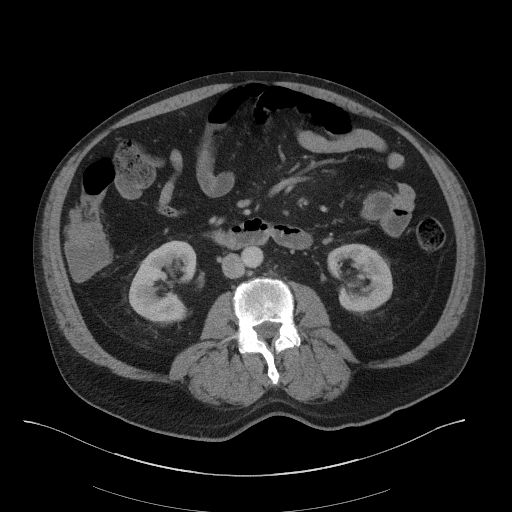
[im 71/118  soft-tissue]
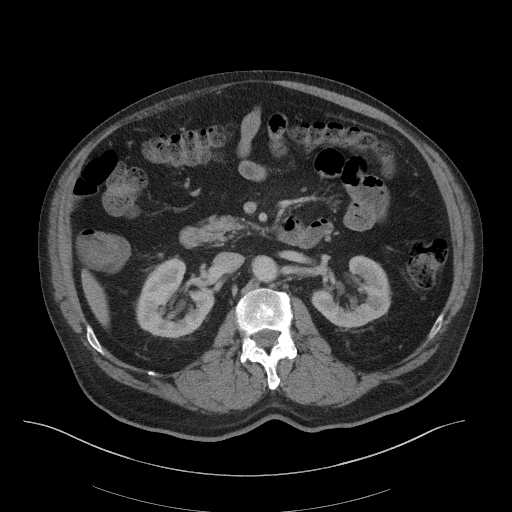
[im 79/118  soft-tissue]
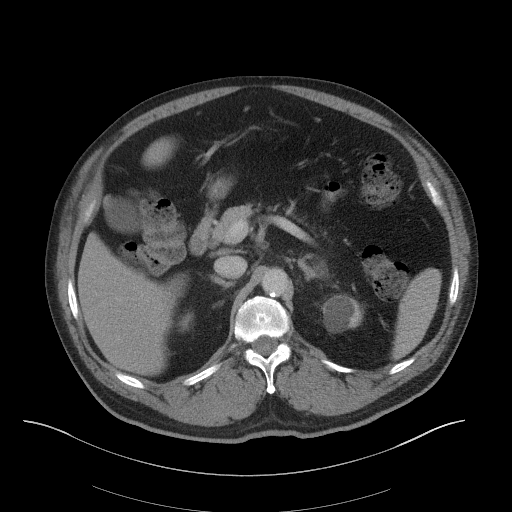
[im 79/118  bone]
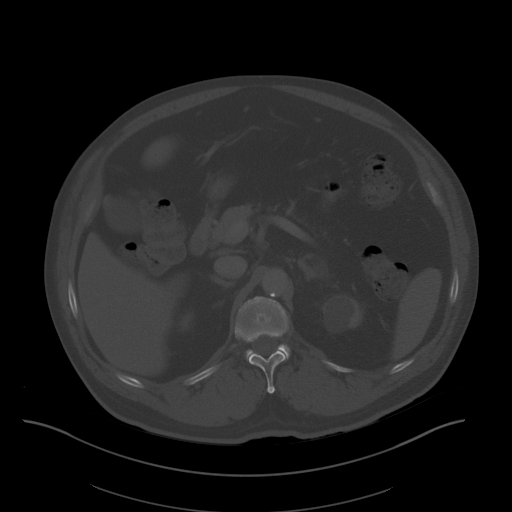
[im 94/118  soft-tissue]
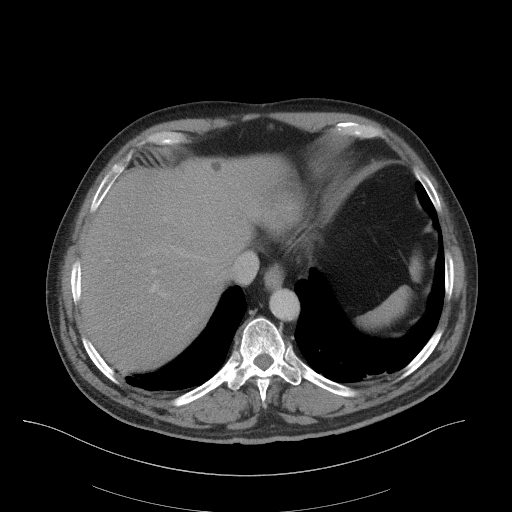
[im 102/118  soft-tissue]
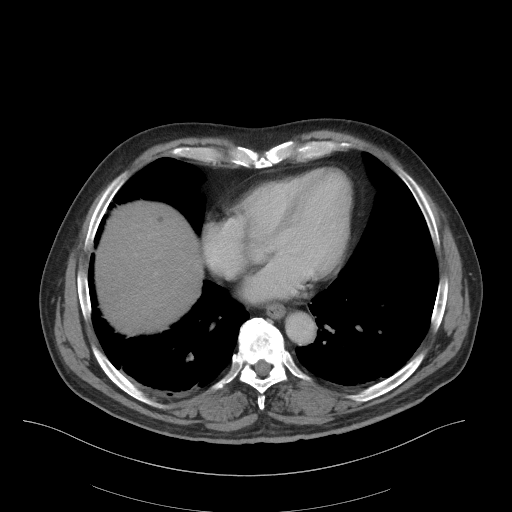
[im 110/118  soft-tissue]
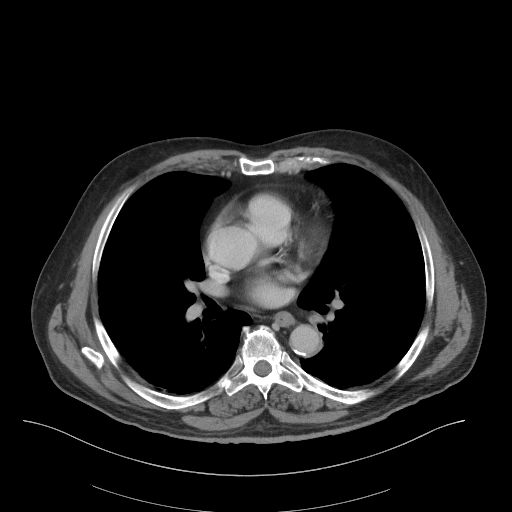

[Series 5: a/p w/ cor · coronal · 0.96mm/px · 3 of 163 slices shown]
[im 41/163  soft-tissue]
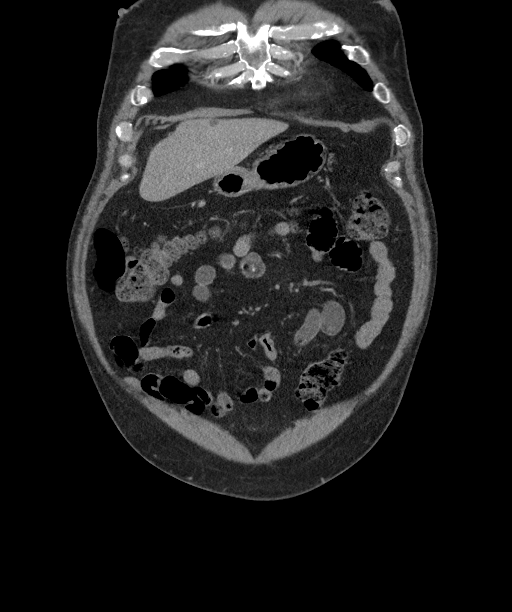
[im 82/163  soft-tissue]
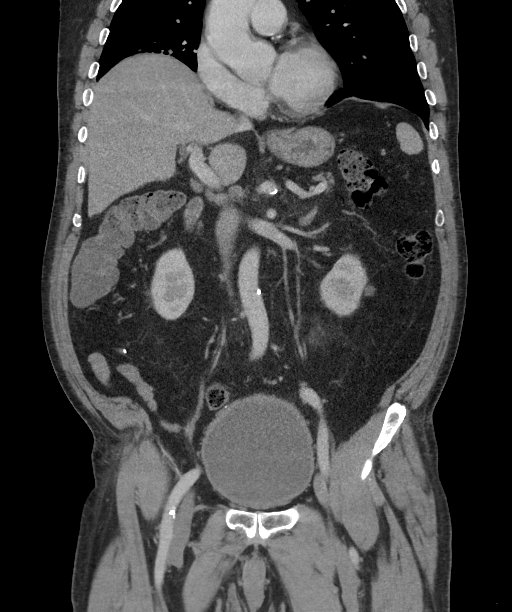
[im 122/163  soft-tissue]
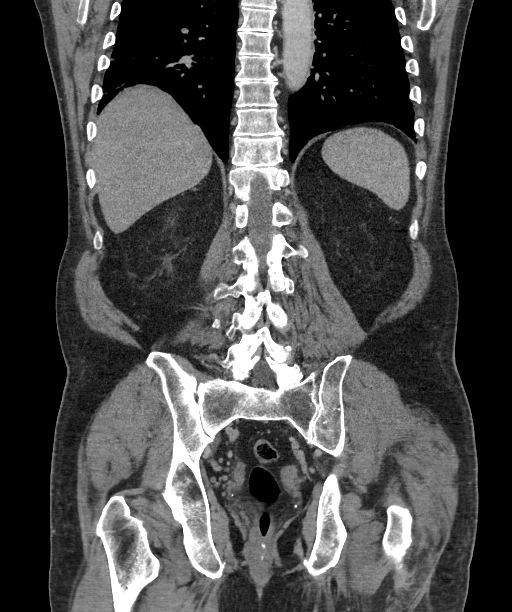

[15 of 46 positions shown; findings below may reference images not displayed]

FINDINGS: Lower chest: Linear scarring at both lung bases. Small probable
subpleural lymph node along the major fissure on the left on image
8. 5 mm left lower lobe nodule on image number 23, unchanged since
01/02/2008. A small noncalcified pleural plaque on the right
hemidiaphragm on image number 12 is significantly smaller than on
01/02/2008. Coronary artery calcifications are noted.

Hepatobiliary: Multiple liver cysts are again demonstrated. Normal
appearing gallbladder.

Pancreas: No mass, inflammatory changes, or other significant
abnormality.

Spleen: Within normal limits in size and appearance.

Adrenals/Urinary Tract: Low density right adrenal mass measuring
x 1.8 cm on image number 35. This measured 1.5 x 1.3 cm normal
04/11/2012 and 1.3 x 1.1 cm on 01/02/2008. Normal appearing left
adrenal gland. Left renal cysts are again demonstrated. Small
bilateral parapelvic renal cysts are also noted. No urinary tract
calculi, masses or hydronephrosis.

Stomach/Bowel: Multiple colonic diverticula. Small sliding hiatal
hernia. Unremarkable small bowel. The proximal portion of the
appendix remains in place, with probable surgical sutures or staples
at the distal aspect of the remaining portion of the appendix.

Vascular/Lymphatic: Mild atheromatous arterial calcifications,
including the abdominal aorta. No enlarged lymph nodes.

Reproductive: Moderate to markedly enlarged and heterogeneous
prostate gland, indenting into the base of the urinary bladder,
without change. The right testicle appears higher in density and
more heterogeneous than the left testicle.

Other: Small bilateral inguinal hernias containing fat. Small
umbilical and supraumbilical hernias containing fat.

Musculoskeletal: Lumbar and lower thoracic spine degenerative
changes. Interval 20% T11 superior endplate compression deformity
with no significant bony retropulsion and no acute fracture lines
visualized.
IMPRESSION: 1. Changes of right epididymo-orchitis described at ultrasound
earlier today.
2. Slowly enlarging right adrenal probable adenoma.
3. Stable moderately to markedly enlarged and heterogeneous prostate
gland.
4. Small bilateral inguinal, umbilical and supraumbilical hernias
containing fat.
5. Aortic atherosclerosis coronary artery atherosclerosis.
6. Stable benign nodularity at the lung bases.
7. Interval old 20% T11 superior endplate compression deformity.
8. Small sliding hiatal hernia.
9. Colonic diverticulosis.

## 2017-10-04 IMAGING — CR DG CHEST 2V
2 series · 2 of 2 positions shown · non-contrast
Comparison: 01/19/2016.  03/12/2012.

CLINICAL DATA: Hypoxemia.

EXAM:
CHEST  2 VIEW

[chest pa]
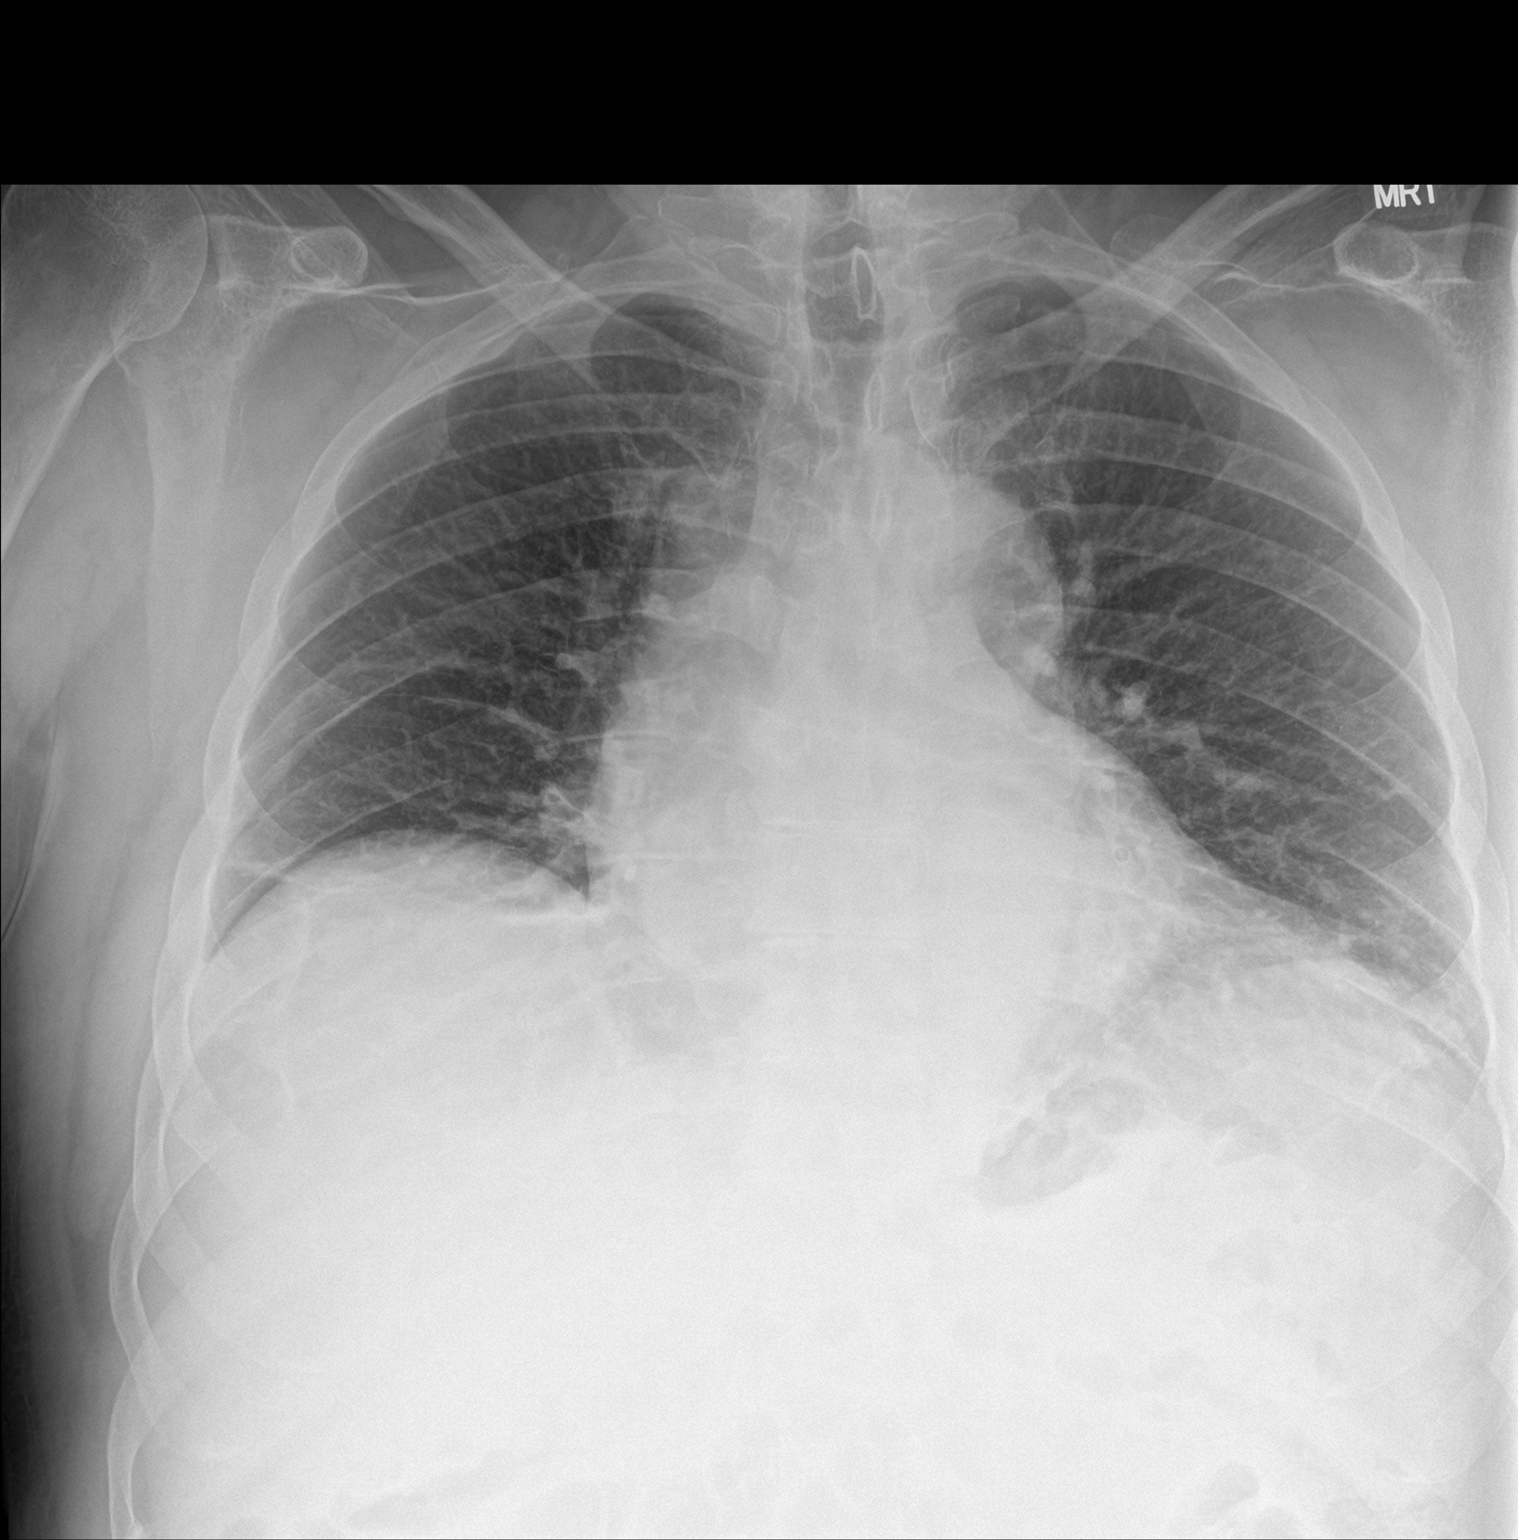

[chest lat]
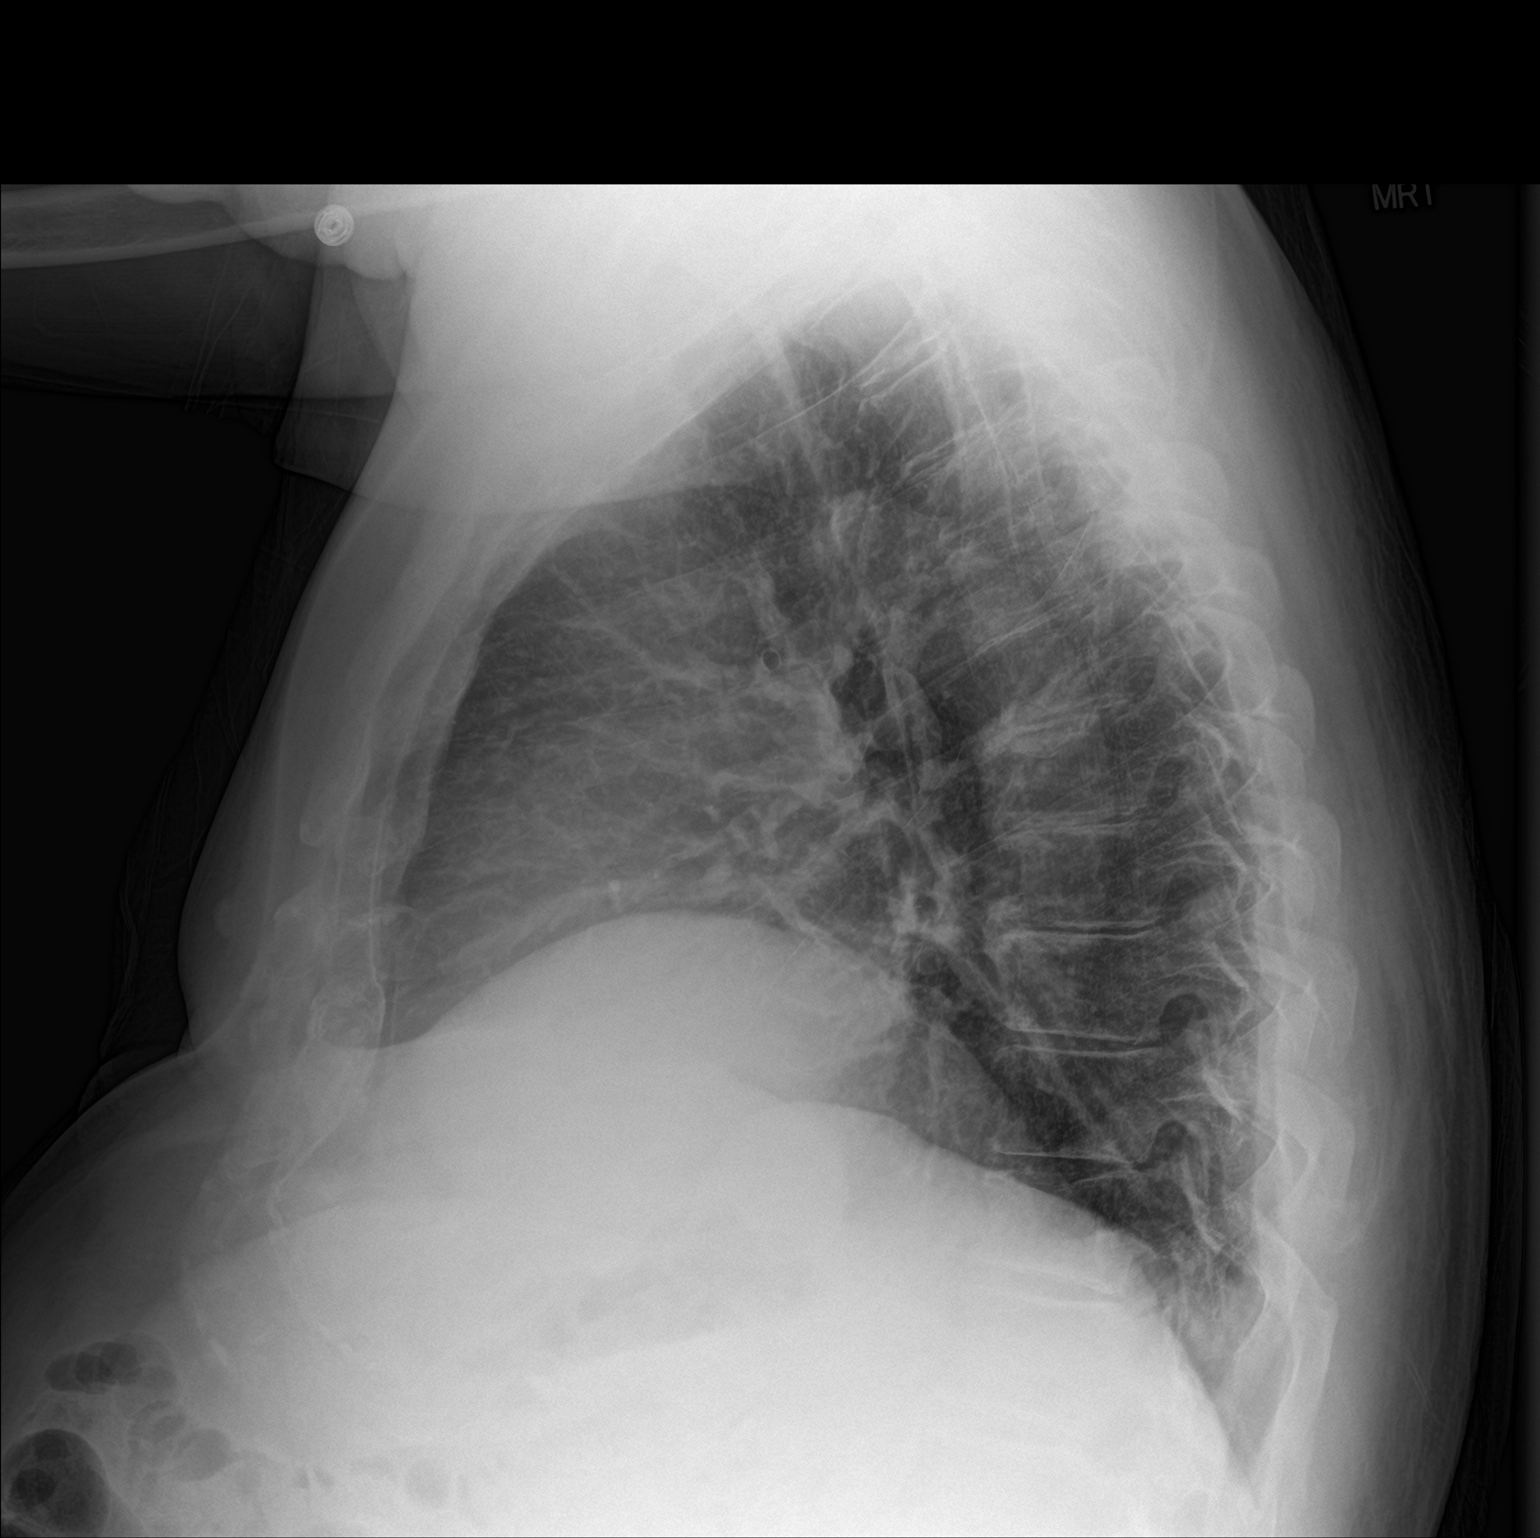

[2 of 2 positions shown; findings below may reference images not displayed]

FINDINGS: The heart is enlarged. The aorta is unfolded. There chronic
interstitial lung markings in the lower lungs likely represent
chronic scarring. Upper lungs are clear. No evidence heart failure
or effusion. No significant bone finding.
IMPRESSION: Cardiomegaly.  Unfolded aorta.  Chronic scarring in the lower lungs.

## 2017-11-13 ENCOUNTER — Other Ambulatory Visit: Payer: Self-pay | Admitting: Family Medicine

## 2018-01-13 ENCOUNTER — Encounter: Payer: Self-pay | Admitting: Family Medicine

## 2018-01-13 ENCOUNTER — Ambulatory Visit (INDEPENDENT_AMBULATORY_CARE_PROVIDER_SITE_OTHER): Payer: PPO | Admitting: Family Medicine

## 2018-01-13 VITALS — BP 102/68 | HR 90 | Temp 98.1°F | Ht 72.0 in | Wt 226.4 lb

## 2018-01-13 DIAGNOSIS — Z0000 Encounter for general adult medical examination without abnormal findings: Secondary | ICD-10-CM

## 2018-01-13 LAB — POC URINALSYSI DIPSTICK (AUTOMATED)
Bilirubin, UA: NEGATIVE
Glucose, UA: NEGATIVE
Ketones, UA: NEGATIVE
Leukocytes, UA: NEGATIVE
Nitrite, UA: NEGATIVE
PH UA: 6 (ref 5.0–8.0)
PROTEIN UA: NEGATIVE
RBC UA: NEGATIVE
Spec Grav, UA: 1.02 (ref 1.010–1.025)
UROBILINOGEN UA: 0.2 U/dL

## 2018-01-13 LAB — PSA: PSA: 1.08 ng/mL (ref 0.10–4.00)

## 2018-01-13 LAB — CBC WITH DIFFERENTIAL/PLATELET
Basophils Absolute: 0 10*3/uL (ref 0.0–0.1)
Basophils Relative: 0.8 % (ref 0.0–3.0)
EOS ABS: 0.2 10*3/uL (ref 0.0–0.7)
Eosinophils Relative: 2.9 % (ref 0.0–5.0)
HCT: 42.8 % (ref 39.0–52.0)
HEMOGLOBIN: 15.2 g/dL (ref 13.0–17.0)
LYMPHS ABS: 2.4 10*3/uL (ref 0.7–4.0)
Lymphocytes Relative: 45.2 % (ref 12.0–46.0)
MCHC: 35.5 g/dL (ref 30.0–36.0)
MCV: 101.4 fl — ABNORMAL HIGH (ref 78.0–100.0)
MONO ABS: 0.5 10*3/uL (ref 0.1–1.0)
Monocytes Relative: 8.8 % (ref 3.0–12.0)
NEUTROS PCT: 42.3 % — AB (ref 43.0–77.0)
Neutro Abs: 2.2 10*3/uL (ref 1.4–7.7)
Platelets: 261 10*3/uL (ref 150.0–400.0)
RBC: 4.22 Mil/uL (ref 4.22–5.81)
RDW: 13 % (ref 11.5–15.5)
WBC: 5.3 10*3/uL (ref 4.0–10.5)

## 2018-01-13 LAB — HEPATIC FUNCTION PANEL
ALBUMIN: 4.4 g/dL (ref 3.5–5.2)
ALT: 74 U/L — ABNORMAL HIGH (ref 0–53)
AST: 40 U/L — ABNORMAL HIGH (ref 0–37)
Alkaline Phosphatase: 51 U/L (ref 39–117)
BILIRUBIN DIRECT: 0.2 mg/dL (ref 0.0–0.3)
TOTAL PROTEIN: 7.1 g/dL (ref 6.0–8.3)
Total Bilirubin: 0.7 mg/dL (ref 0.2–1.2)

## 2018-01-13 LAB — BASIC METABOLIC PANEL
BUN: 16 mg/dL (ref 6–23)
CO2: 24 meq/L (ref 19–32)
Calcium: 9.8 mg/dL (ref 8.4–10.5)
Chloride: 102 mEq/L (ref 96–112)
Creatinine, Ser: 0.81 mg/dL (ref 0.40–1.50)
GFR: 99.98 mL/min (ref >60.00)
Glucose, Bld: 89 mg/dL (ref 70–99)
Potassium: 3.5 mEq/L (ref 3.5–5.1)
SODIUM: 140 meq/L (ref 135–145)

## 2018-01-13 LAB — LIPID PANEL
CHOLESTEROL: 194 mg/dL (ref 0–200)
HDL: 63.8 mg/dL (ref >39.00)
LDL Cholesterol: 103 mg/dL — ABNORMAL HIGH (ref 0–99)
NonHDL: 129.85
Total CHOL/HDL Ratio: 3
Triglycerides: 133 mg/dL (ref 0.0–149.0)
VLDL: 26.6 mg/dL (ref 0.0–40.0)

## 2018-01-13 LAB — TSH: TSH: 2.93 u[IU]/mL (ref 0.35–4.50)

## 2018-01-13 NOTE — Progress Notes (Signed)
   Subjective:    Patient ID: Calvin [name], male    DOB: [birth year], 72 y.o.   [name]: [id number]  HPI Here for a well exam. He feels well. His BP is stable.    Review of Systems  Constitutional: Negative.   HENT: Negative.   Eyes: Negative.   Respiratory: Negative.   Cardiovascular: Negative.   Gastrointestinal: Negative.   Genitourinary: Negative.   Musculoskeletal: Negative.   Skin: Negative.   Neurological: Negative.   Psychiatric/Behavioral: Negative.        Objective:   Physical Exam  Constitutional: He is oriented to person, place, and time. He appears well-developed and well-nourished. No distress.  HENT:  Head: Normocephalic and atraumatic.  Right Ear: External ear normal.  Left Ear: External ear normal.  Nose: Nose normal.  Mouth/Throat: Oropharynx is clear and moist. No oropharyngeal exudate.  Eyes: Pupils are equal, round, and reactive to light. Conjunctivae and EOM are normal. Right eye exhibits no discharge. Left eye exhibits no discharge. No scleral icterus.  Neck: Neck supple. No JVD present. No tracheal deviation present. No thyromegaly present.  Cardiovascular: Normal rate, regular rhythm, normal heart sounds and intact distal pulses. Exam reveals no gallop and no friction rub.  No murmur heard. Pulmonary/Chest: Effort normal and breath sounds normal. No respiratory distress. He has no wheezes. He has no rales. He exhibits no tenderness.  Abdominal: Soft. Bowel sounds are normal. He exhibits no distension and no mass. There is no tenderness. There is no rebound and no guarding.  Genitourinary: Rectum normal, prostate normal and penis normal. Rectal exam shows guaiac negative stool. No penile tenderness.  Musculoskeletal: Normal range of motion. He exhibits no edema or tenderness.  Lymphadenopathy:    He has no cervical adenopathy.  Neurological: He is alert and oriented to person, place, and time. He has normal reflexes. He displays normal reflexes. No  cranial nerve deficit. He exhibits normal muscle tone. Coordination normal.  Skin: Skin is warm and dry. No rash noted. He is not diaphoretic. No erythema. No pallor.  Psychiatric: He has a normal mood and affect. His behavior is normal. Judgment and thought content normal.          Assessment & Plan:  Well exam. We discussed diet and exercise. Get fasting labs.  Alysia Penna, MD

## 2018-01-20 ENCOUNTER — Other Ambulatory Visit: Payer: Self-pay | Admitting: Family Medicine

## 2018-01-24 ENCOUNTER — Other Ambulatory Visit: Payer: Self-pay | Admitting: *Deleted

## 2018-01-24 NOTE — Patient Outreach (Signed)
[facility]) Care Management  01/24/2018  Calvin [name] November 30, 1945 [id number]   Member identified as high risk according to [employer] health questionnaire.  Call placed to introduce [facility] care management services and perform telephone screening.  This care manager introduced self and purpose of call.  Member denies any needs at this time, will not open case.  Valente David, [place], MSN [place] [phone]

## 2018-02-04 ENCOUNTER — Other Ambulatory Visit: Payer: Self-pay | Admitting: Family Medicine

## 2018-02-07 ENCOUNTER — Ambulatory Visit (INDEPENDENT_AMBULATORY_CARE_PROVIDER_SITE_OTHER): Payer: PPO | Admitting: Sports Medicine

## 2018-02-07 ENCOUNTER — Encounter: Payer: Self-pay | Admitting: Sports Medicine

## 2018-02-07 ENCOUNTER — Ambulatory Visit: Payer: Self-pay

## 2018-02-07 VITALS — BP 124/88 | HR 96 | Ht 72.0 in | Wt 228.8 lb

## 2018-02-07 DIAGNOSIS — M25462 Effusion, left knee: Secondary | ICD-10-CM | POA: Diagnosis not present

## 2018-02-07 DIAGNOSIS — M1712 Unilateral primary osteoarthritis, left knee: Secondary | ICD-10-CM

## 2018-02-07 DIAGNOSIS — M25562 Pain in left knee: Secondary | ICD-10-CM | POA: Diagnosis not present

## 2018-02-07 NOTE — Progress Notes (Signed)
Juanda Bond. Rigby, [facility] [place]  FARLEY CROOKER - 72 y.o. male [name] [id number]  Date of birth: [birth year]  Visit Date: 02/07/2018  PCP: Laurey Morale, MD   Referred by: Laurey Morale, MD  Scribe(s) for today's visit: Wendy Poet, LAT, ATC  SUBJECTIVE:  Calvin [name] is here for New Patient (Initial Visit) (L knee pain) .    His L medial knee pain symptoms INITIALLY: Began a few weeks ago w/ no known MOI.  He states that he was recently helping his daughter move and had to go up and down a lot of stairs. Described as moderate pain that varies in intensity from aching to sharp and shooting, nonradiating Worsened with ascending/descending stairs or walking uphill Improved with nothing noted Additional associated symptoms include: no swelling and no mechanical symptoms noted    At this time symptoms show no change compared to onset  He has been taking Advil intermittently but does not notice any improvement in his symptoms w/ this medication.   REVIEW OF SYSTEMS: Denies night time disturbances. Denies fevers, chills, or night sweats. Denies unexplained weight loss. Denies personal history of cancer. Denies changes in bowel or bladder habits. Denies recent unreported falls. Denies new or worsening dyspnea or wheezing. Denies headaches or dizziness.  Denies numbness, tingling or weakness  In the extremities.  Denies dizziness or presyncopal episodes Denies lower extremity edema     HISTORY & PERTINENT PRIOR DATA:  Significant/pertinent history, findings, studies include:  reports that he has never smoked. He has never used smokeless tobacco. No results for input(s): HGBA1C, LABURIC, CREATINE in the last 8760 hours. No specialty comments available. No problems updated.  Otherwise prior history reviewed and updated per electronic medical record.    OBJECTIVE:  VS:  HT:6' (182.9 cm)   WT:228 lb 12.8 oz  (103.8 kg)  BMI:31.02    BP:124/88  HR:96bpm  TEMP: ( )  RESP:94 %   PHYSICAL EXAM: CONSTITUTIONAL: Well-developed, Well-nourished and In no acute distress Alert & appropriately interactive. and Not depressed or anxious appearing. RESPIRATORY: No increased work of breathing and Trachea Midline EYES: Pupils are equal., EOM intact without nystagmus. and No scleral icterus.  Lower extremities: Warm and well perfused Edema: Joint associated swelling as per Orthopedic exam and No Pre-tibial edema NEURO: unremarkable Normal associated myotomal distribution strength to manual muscle testing Normal sensation to light touch  MSK Exam: Left Knee  Alignment & Contours: normal Skin: No overlying erythema/ecchymosis Effusion: yes   Generalized Synovitis: mild Knee Tenderness: No focal bony TTP Gait: stiff-legged Patellar grind produces: No pain and Mild crepitation   RANGE OF MOTION & STRENGTH  EXTENSION: Normal  with no pain.   Strength: Normal FLEXION: Normal with no pain.   Strength: Normal   LIGAMENTOUS TESTING  Varus & Valgus Strain: stable to testing Anterior & Posterior Drawer: stable to testing    SPECIALITY TESTING:  Mcmurray's: positive, mild pain     PROCEDURES & DATA REVIEWED:  . US Guided Injection per procedure note  ASSESSMENT   1. Acute pain of left knee   2. Primary osteoarthritis of left knee   3. Effusion of left knee     PLAN:   Apply ice packs Compressive bandage recommended    . Plan Body Helix Compression Sleeve for activities. . If any lack of improvement consider further diagnostic evaluation with Plain film x-rays to determine the extent of the left  knee arthritis which I suspect is moderate per ultrasound. . We will go ahead and inject the knee per procedure note and have them begin on hip and knee strenghtening exersises. Additionally we discussed the merits of compression and/or bracing and recommend prophylatic compression with activity.   Icing discussed PRN.  If persistent ongoing symptoms can consider repeat injections and viscous supplementation. No problem-specific Assessment & Plan notes found for this encounter.    Follow-up: Return if symptoms worsen or fail to improve.      Please see additional documentation for Objective, Assessment and Plan sections. Pertinent additional documentation may be included in corresponding procedure notes, imaging studies, problem based documentation and patient instructions. Please see these sections of the encounter for additional information regarding this visit.  CMA/ATC served as Education administrator during this visit. History, Physical, and Plan performed by medical provider. Documentation and orders reviewed and attested to.      Gerda Diss, [facility] Physician

## 2018-02-07 NOTE — Patient Instructions (Addendum)
You had an injection today.  Things to be aware of after injection are listed below: . You may experience no significant improvement or even a slight worsening in your symptoms during the first 24 to 48 hours.  After that we expect your symptoms to improve gradually over the next 2 weeks for the medicine to have its maximal effect.  You should continue to have improvement out to 6 weeks after your injection. . Dr. Rigby recommends icing the site of the injection for 20 minutes  1-2 times the day of your injection . You may shower but no swimming, tub bath or Jacuzzi for 24 hours. . If your bandage falls off this does not need to be replaced.  It is appropriate to remove the bandage after 4 hours. . You may resume light activities as tolerated unless otherwise directed per Dr. Rigby during your visit  POSSIBLE STEROID SIDE EFFECTS:  Side effects from injectable steroids tend to be less than when taken orally however you may experience some of the symptoms listed below.  If experienced these should only last for a short period of time. Change in menstrual flow  Edema (swelling)  Increased appetite Skin flushing (redness)  Skin rash/acne  Thrush (oral) Yeast vaginitis    Increased sweating  Depression Increased blood glucose levels Cramping and leg/calf  Euphoria (feeling happy)  POSSIBLE PROCEDURE SIDE EFFECTS: The side effects of the injection are usually fairly minimal however if you may experience some of the following side effects that are usually self-limited and will is off on their own.  If you are concerned please feel free to call the office with questions:  Increased numbness or tingling  Nausea or vomiting  Swelling or bruising at the injection site   Please call our office if if you experience any of the following symptoms over the next week as these can be signs of infection:   Fever greater than 100.5F  Significant swelling at the injection site  Significant redness or drainage  from the injection site  If after 2 weeks you are continuing to have worsening symptoms please call our office to discuss what the next appropriate actions should be including the potential for a return office visit or other diagnostic testing.   I recommend you obtained a compression sleeve to help with your joint problems. There are many options on the market however I recommend obtaining a knee Body Helix compression sleeve.  You can find information (including how to appropriate measure yourself for sizing) can be found at www.Body Helix.com.  Many of these products are health savings account (HSA) eligible.   You can use the compression sleeve at any time throughout the day but is most important to use while being active as well as for 2 hours post-activity.   It is appropriate to ice following activity with the compression sleeve in place.    

## 2018-02-07 NOTE — Procedures (Signed)
PROCEDURE NOTE:  Ultrasound Guided: Aspiration and Injection: Left knee Images were obtained and interpreted by myself, Teresa Coombs, DO  Images have been saved and stored to PACS system. Images obtained on: GE S7 Ultrasound machine    ULTRASOUND FINDINGS:  Left knee with degenerative meniscal bulging of the medial and lateral meniscus Moderate effusion  DESCRIPTION OF PROCEDURE:  The patient's clinical condition is marked by substantial pain and/or significant functional disability. Other conservative therapy has not provided relief, is contraindicated, or not appropriate. There is a reasonable likelihood that injection will significantly improve the patient's pain and/or functional impairment.   After discussing the risks, benefits and expected outcomes of the injection and all questions were reviewed and answered, the patient wished to undergo the above named procedure.  Verbal consent was obtained.  The ultrasound was used to identify the target structure and adjacent neurovascular structures. The skin was then prepped in sterile fashion and the target structure was injected under direct visualization using sterile technique as below:  Single injection performed as below: PREP: Alcohol, Ethel Chloride and 5 cc 1% lidocaine on 25g 1.5 in. needle APPROACH:superiolateral, stopcock technique, 18g 1.5 in. INJECTATE: 2 cc 0.5% Marcaine and 2 cc 40mg /mL DepoMedrol ASPIRATE: 4mL  and straw colored  DRESSING: Band-Aid and Body Helix Full Knee Compression Sleeve  Post procedural instructions including recommending icing and warning signs for infection were reviewed.    This procedure was well tolerated and there were no complications.   IMPRESSION: Succesful Ultrasound Guided: Aspiration and Injection

## 2018-03-21 DIAGNOSIS — H524 Presbyopia: Secondary | ICD-10-CM | POA: Diagnosis not present

## 2018-04-01 DIAGNOSIS — B078 Other viral warts: Secondary | ICD-10-CM | POA: Diagnosis not present

## 2018-04-01 DIAGNOSIS — L820 Inflamed seborrheic keratosis: Secondary | ICD-10-CM | POA: Diagnosis not present

## 2018-04-01 DIAGNOSIS — L718 Other rosacea: Secondary | ICD-10-CM | POA: Diagnosis not present

## 2018-04-02 ENCOUNTER — Other Ambulatory Visit: Payer: Self-pay | Admitting: Family Medicine

## 2018-04-23 ENCOUNTER — Ambulatory Visit (INDEPENDENT_AMBULATORY_CARE_PROVIDER_SITE_OTHER): Payer: PPO

## 2018-04-23 ENCOUNTER — Ambulatory Visit: Payer: Self-pay

## 2018-04-23 ENCOUNTER — Ambulatory Visit (INDEPENDENT_AMBULATORY_CARE_PROVIDER_SITE_OTHER): Payer: PPO | Admitting: Sports Medicine

## 2018-04-23 ENCOUNTER — Encounter: Payer: Self-pay | Admitting: Sports Medicine

## 2018-04-23 VITALS — BP 122/84 | HR 93 | Ht 72.0 in | Wt 228.4 lb

## 2018-04-23 DIAGNOSIS — M1712 Unilateral primary osteoarthritis, left knee: Secondary | ICD-10-CM

## 2018-04-23 DIAGNOSIS — S63618A Unspecified sprain of other finger, initial encounter: Secondary | ICD-10-CM

## 2018-04-23 DIAGNOSIS — M25462 Effusion, left knee: Secondary | ICD-10-CM | POA: Diagnosis not present

## 2018-04-23 DIAGNOSIS — M79644 Pain in right finger(s): Secondary | ICD-10-CM

## 2018-04-23 DIAGNOSIS — S6991XA Unspecified injury of right wrist, hand and finger(s), initial encounter: Secondary | ICD-10-CM | POA: Diagnosis not present

## 2018-04-23 DIAGNOSIS — M25562 Pain in left knee: Secondary | ICD-10-CM | POA: Diagnosis not present

## 2018-04-23 MED ORDER — DICLOFENAC SODIUM 1 % TD GEL: TRANSDERMAL | 1 refills | Status: DC

## 2018-04-23 NOTE — Progress Notes (Signed)
Calvin [name]. Calvin [name], [place] [facility] [place]  ACEL NATZKE - 72 y.o. male [name] [id number]  Date of birth: 04-Aug-1945  Visit Date: 04/23/2018  PCP: Laurey Morale, MD   Referred by: Laurey Morale, MD  Scribe(s) for today's visit: Wendy Poet, LAT, ATC  SUBJECTIVE:  Calvin [name] is here for Follow-up (L knee pain) .    02/07/2018: His L medial knee pain symptoms INITIALLY: Began a few weeks ago w/ no known MOI.  He states that he was recently helping his daughter move and had to go up and down a lot of stairs. Described as moderate pain that varies in intensity from aching to sharp and shooting, nonradiating Worsened with ascending/descending stairs or walking uphill Improved with nothing noted Additional associated symptoms include: no swelling and no mechanical symptoms noted   At this time symptoms show no change compared to onset  He has been taking Advil intermittently but does not notice any improvement in his symptoms w/ this medication.  04/23/2018: Compared to the last office visit, his previously described symptoms show no change. He got about a month of relief after the last injection but he has bene doing a lot of climbing recently and sx have flared up.  Current symptoms are moderate & are nonradiating He has been taking Advil prn. He uses compression sleeve when he goes to the gym, he notes that he cannot wear it all of the time because it makes his leg swell.  He c/o pain in the R knee, only with use.   He c/o pain and swelling in the R index finger. He is unable to bend the finger completely. Sx started around 1 month ago when he jammed the finger. There is increased warmth but no erythema. Pain is constant, worse with use.    REVIEW OF SYSTEMS: Denies night time disturbances. Denies fevers, chills, or night sweats. Denies unexplained weight loss. Denies personal history of cancer. Denies changes in bowel  or bladder habits. Reports recent unreported falls x 1 - injured R index finger. Denies new or worsening dyspnea or wheezing. Reports daily headaches. Denies numbness, tingling or weakness in the extremities.  Denies dizziness or presyncopal episodes Reports lower extremity edema (feet)   HISTORY & PERTINENT PRIOR DATA:  Significant/pertinent history, findings, studies include:  reports that he has never smoked. He has never used smokeless tobacco. No results for input(s): HGBA1C, LABURIC, CREATINE in the last 8760 hours. No specialty comments available. Problem  Sprain of Index Finger, Initial Encounter  Primary Osteoarthritis of Left Knee    Otherwise prior history reviewed and updated per electronic medical record.    OBJECTIVE:  VS:  HT:6' (182.9 cm)   WT:228 lb 6.4 oz (103.6 kg)  BMI:30.97    BP:122/84  HR:93bpm  TEMP: ( )  RESP:97 %   PHYSICAL EXAM: CONSTITUTIONAL: Well-developed, Well-nourished and In no acute distress Psychiatric: Alert & appropriately interactive. and Not depressed or anxious appearing. RESPIRATORY: No increased work of breathing and Trachea Midline EYES: Pupils are equal., EOM intact without nystagmus. and No scleral icterus.  Lower extremities: EXTREMITY EXAM: Warm and well perfused Edema: Joint associated swelling as per Orthopedic exam and No Pre-tibial edema NEURO: unremarkable  MSK Exam: Left knee with generalized synovitis.  Moderate osteoarthritic bossing. Small effusion. Right index finger is well aligned with small amount of swelling over the MCP.  He has slight areas juiced flexion extension is minimal.  PROCEDURES & DATA REVIEWED:  US Guided Injection per procedure note   ASSESSMENT   1. Primary osteoarthritis of left knee   2. Pain in finger of right hand   3. Acute pain of left knee   4. Sprain of index finger, initial encounter     PLAN:    Sprain of index finger, initial encounter Right index PIP strain.  Small  amount of swelling still.  He does have arthritis in the DIP.  We will plan him start on Voltaren gel and continue trying to avoid ulnar deviating activities.  If any lack of improvement can consider intra-articular injection.  Primary osteoarthritis of left knee Mainly patellofemoral wear.  He had good improvement with intra-articular injection at last visit.  If any recurrence can repeat this but will start him on Voltaren gel and discussed avoidance of exacerbating activities.  Ultrasound not performed today.  No charges placed.   Follow-up: Return if symptoms worsen or fail to improve.      CMA/ATC served as Education administrator during this visit. History, Physical, and Plan performed by medical provider. Documentation and orders reviewed and attested to.      Gerda Diss, [place] Physician

## 2018-04-23 NOTE — Assessment & Plan Note (Signed)
Mainly patellofemoral wear.  He had good improvement with intra-articular injection at last visit.  If any recurrence can repeat this but will start him on Voltaren gel and discussed avoidance of exacerbating activities.

## 2018-04-23 NOTE — Assessment & Plan Note (Signed)
Right index PIP strain.  Small amount of swelling still.  He does have arthritis in the DIP.  We will plan him start on Voltaren gel and continue trying to avoid ulnar deviating activities.  If any lack of improvement can consider intra-articular injection.

## 2018-07-01 ENCOUNTER — Other Ambulatory Visit: Payer: Self-pay | Admitting: Family Medicine

## 2018-07-07 DIAGNOSIS — H90A22 Sensorineural hearing loss, unilateral, left ear, with restricted hearing on the contralateral side: Secondary | ICD-10-CM | POA: Diagnosis not present

## 2018-07-12 ENCOUNTER — Encounter: Payer: Self-pay | Admitting: Sports Medicine

## 2018-07-28 ENCOUNTER — Other Ambulatory Visit: Payer: Self-pay | Admitting: Family Medicine

## 2018-08-07 DIAGNOSIS — H903 Sensorineural hearing loss, bilateral: Secondary | ICD-10-CM | POA: Diagnosis not present

## 2018-08-30 ENCOUNTER — Other Ambulatory Visit: Payer: Self-pay | Admitting: Family Medicine

## 2018-09-10 ENCOUNTER — Telehealth: Payer: Self-pay

## 2018-09-10 NOTE — Telephone Encounter (Signed)
Author phoned pt. to offer to do virtual AWV with PCP. Pt. Stated he was not aware he had an awv scheduled, and "frankly, think they are a waste of time from what I remember from my last visit". Pt. Stated he did not need anything from Dr. Sarajane Jews at this time. Appointment cancelled.

## 2018-09-24 ENCOUNTER — Ambulatory Visit: Payer: Self-pay

## 2018-09-29 ENCOUNTER — Other Ambulatory Visit: Payer: Self-pay | Admitting: Family Medicine

## 2018-09-30 NOTE — Telephone Encounter (Signed)
Finasteride 5 mg is what is covered by the insurance company. Dr. Sarajane Jews please advise on change in meds.  Thanks

## 2018-10-25 ENCOUNTER — Other Ambulatory Visit: Payer: Self-pay | Admitting: Family Medicine

## 2018-10-26 ENCOUNTER — Other Ambulatory Visit: Payer: Self-pay | Admitting: Family Medicine

## 2018-10-27 ENCOUNTER — Other Ambulatory Visit: Payer: Self-pay | Admitting: Family Medicine

## 2018-11-28 ENCOUNTER — Other Ambulatory Visit: Payer: Self-pay | Admitting: Family Medicine

## 2018-12-06 ENCOUNTER — Other Ambulatory Visit: Payer: Self-pay | Admitting: Family Medicine

## 2018-12-14 ENCOUNTER — Other Ambulatory Visit: Payer: Self-pay | Admitting: Family Medicine

## 2018-12-25 ENCOUNTER — Telehealth: Payer: Self-pay

## 2018-12-25 NOTE — Telephone Encounter (Signed)
Copied from [place] [id number]. Topic: General - Other >> Dec 25, 2018  2:52 PM Leward Quan A wrote: Reason for HVF:MBBUY Guillory called to say the patient spoke to Dr Sarajane Jews about swelling in his right foot and a circulation test was performed on 12/25/2018 that show moderate results  Ph# [phone]

## 2018-12-26 NOTE — Telephone Encounter (Signed)
Dr. Fry please advise. Thanks  

## 2018-12-30 NOTE — Telephone Encounter (Signed)
Patient scheduled for OV and CPE on 01/02/2019 at 11:30 AM

## 2018-12-30 NOTE — Telephone Encounter (Signed)
I have not seen him in over a year. I have no idea what test she is talking about. Make an OV to talk about this

## 2019-01-01 ENCOUNTER — Ambulatory Visit: Payer: Self-pay | Admitting: Family Medicine

## 2019-01-02 ENCOUNTER — Ambulatory Visit (INDEPENDENT_AMBULATORY_CARE_PROVIDER_SITE_OTHER): Payer: PPO | Admitting: Family Medicine

## 2019-01-02 ENCOUNTER — Other Ambulatory Visit: Payer: Self-pay

## 2019-01-02 ENCOUNTER — Encounter: Payer: Self-pay | Admitting: Family Medicine

## 2019-01-02 VITALS — BP 122/80 | HR 88 | Temp 98.3°F | Ht 71.5 in | Wt 226.1 lb

## 2019-01-02 DIAGNOSIS — M79674 Pain in right toe(s): Secondary | ICD-10-CM | POA: Diagnosis not present

## 2019-01-02 DIAGNOSIS — Z0000 Encounter for general adult medical examination without abnormal findings: Secondary | ICD-10-CM

## 2019-01-02 LAB — POC URINALSYSI DIPSTICK (AUTOMATED)
Bilirubin, UA: NEGATIVE
Blood, UA: NEGATIVE
Glucose, UA: NEGATIVE
Ketones, UA: NEGATIVE
Nitrite, UA: NEGATIVE
Protein, UA: POSITIVE — AB
Spec Grav, UA: >=1.03 — AB (ref 1.010–1.025)
Urobilinogen, UA: 0.2 E.U./dL
pH, UA: 6 (ref 5.0–8.0)

## 2019-01-02 LAB — CBC WITH DIFFERENTIAL/PLATELET
Basophils Absolute: 0.1 10*3/uL (ref 0.0–0.1)
Basophils Relative: 1.2 % (ref 0.0–3.0)
Eosinophils Absolute: 0.2 10*3/uL (ref 0.0–0.7)
Eosinophils Relative: 3.1 % (ref 0.0–5.0)
HCT: 42.8 % (ref 39.0–52.0)
Hemoglobin: 14.9 g/dL (ref 13.0–17.0)
Lymphocytes Relative: 34 % (ref 12.0–46.0)
Lymphs Abs: 1.9 10*3/uL (ref 0.7–4.0)
MCHC: 34.8 g/dL (ref 30.0–36.0)
MCV: 101.5 fl — ABNORMAL HIGH (ref 78.0–100.0)
Monocytes Absolute: 0.6 10*3/uL (ref 0.1–1.0)
Monocytes Relative: 11 % (ref 3.0–12.0)
Neutro Abs: 2.8 10*3/uL (ref 1.4–7.7)
Neutrophils Relative %: 50.7 % (ref 43.0–77.0)
Platelets: 246 10*3/uL (ref 150.0–400.0)
RBC: 4.22 Mil/uL (ref 4.22–5.81)
RDW: 12.8 % (ref 11.5–15.5)
WBC: 5.4 10*3/uL (ref 4.0–10.5)

## 2019-01-02 LAB — LIPID PANEL
Cholesterol: 182 mg/dL (ref 0–200)
HDL: 43.4 mg/dL (ref >39.00)
LDL Cholesterol: 118 mg/dL — ABNORMAL HIGH (ref 0–99)
NonHDL: 138.69
Total CHOL/HDL Ratio: 4
Triglycerides: 105 mg/dL (ref 0.0–149.0)
VLDL: 21 mg/dL (ref 0.0–40.0)

## 2019-01-02 LAB — BASIC METABOLIC PANEL
BUN: 17 mg/dL (ref 6–23)
CO2: 27 mEq/L (ref 19–32)
Calcium: 9.7 mg/dL (ref 8.4–10.5)
Chloride: 101 mEq/L (ref 96–112)
Creatinine, Ser: 0.86 mg/dL (ref 0.40–1.50)
GFR: 87.54 mL/min (ref >60.00)
Glucose, Bld: 97 mg/dL (ref 70–99)
Potassium: 3.7 mEq/L (ref 3.5–5.1)
Sodium: 138 mEq/L (ref 135–145)

## 2019-01-02 LAB — TSH: TSH: 2.22 u[IU]/mL (ref 0.35–4.50)

## 2019-01-02 LAB — HEPATIC FUNCTION PANEL
ALT: 29 U/L (ref 0–53)
AST: 22 U/L (ref 0–37)
Albumin: 4.7 g/dL (ref 3.5–5.2)
Alkaline Phosphatase: 60 U/L (ref 39–117)
Bilirubin, Direct: 0.2 mg/dL (ref 0.0–0.3)
Total Bilirubin: 1.1 mg/dL (ref 0.2–1.2)
Total Protein: 7.2 g/dL (ref 6.0–8.3)

## 2019-01-02 LAB — PSA: PSA: 0.93 ng/mL (ref 0.10–4.00)

## 2019-01-02 MED ORDER — ATORVASTATIN CALCIUM 20 MG PO TABS: 20.0000 mg | ORAL_TABLET | Freq: Every day | ORAL | 3 refills | Status: DC

## 2019-01-02 NOTE — Progress Notes (Signed)
Subjective:    Patient ID: Calvin [name], male    DOB: [birth year], 73 y.o.   [name]: [id number]  HPI Here for a well exam. He feels well in general except for left knee pain and a painful right great toe. The knee has bothered him for several years and he has had it injected before. He is scheduled to see Dr. Gardenia Phlegm for this on 01-06-19. The toe pain involves a hard knot that appeared about 6 months ago. He has been spending some time in [place] helping to rehab a house where his daughter lives.    Review of Systems  Constitutional: Negative.   HENT: Negative.   Eyes: Negative.   Respiratory: Negative.   Cardiovascular: Negative.   Gastrointestinal: Negative.   Genitourinary: Negative.   Musculoskeletal: Positive for arthralgias and back pain.  Skin: Negative.   Neurological: Negative.   Psychiatric/Behavioral: Negative.        Objective:   Physical Exam Constitutional:      General: He is not in acute distress.    Appearance: He is well-developed. He is not diaphoretic.  HENT:     Head: Normocephalic and atraumatic.     Right Ear: External ear normal.     Left Ear: External ear normal.     Nose: Nose normal.     Mouth/Throat:     Pharynx: No oropharyngeal exudate.  Eyes:     General: No scleral icterus.       Right eye: No discharge.        Left eye: No discharge.     Conjunctiva/sclera: Conjunctivae normal.     Pupils: Pupils are equal, round, and reactive to light.  Neck:     Musculoskeletal: Neck supple.     Thyroid: No thyromegaly.     Vascular: No JVD.     Trachea: No tracheal deviation.  Cardiovascular:     Rate and Rhythm: Normal rate and regular rhythm.     Heart sounds: Normal heart sounds. No murmur. No friction rub. No gallop.   Pulmonary:     Effort: Pulmonary effort is normal. No respiratory distress.     Breath sounds: Normal breath sounds. No wheezing or rales.  Chest:     Chest wall: No tenderness.  Abdominal:     General: Bowel sounds are  normal. There is no distension.     Palpations: Abdomen is soft. There is no mass.     Tenderness: There is no abdominal tenderness. There is no guarding or rebound.  Genitourinary:    Penis: Normal. No tenderness.      Prostate: Normal.     Rectum: Normal. Guaiac result negative.  Musculoskeletal: Normal range of motion.     Comments: 1+ edema in both ankles. There is a tender hard knot on the dorsal right great toe   Lymphadenopathy:     Cervical: No cervical adenopathy.  Skin:    General: Skin is warm and dry.     Coloration: Skin is not pale.     Findings: No erythema or rash.  Neurological:     Mental Status: He is alert and oriented to person, place, and time.     Cranial Nerves: No cranial nerve deficit.     Motor: No abnormal muscle tone.     Coordination: Coordination normal.     Deep Tendon Reflexes: Reflexes are normal and symmetric. Reflexes normal.  Psychiatric:        Behavior: Behavior normal.  Thought Content: Thought content normal.        Judgment: Judgment normal.           Assessment & Plan:  Well exam. We discussed diet and exercise. Get fasting labs. Refer to Podiatry for the sore toe.  Alysia Penna, MD

## 2019-01-02 NOTE — Addendum Note (Signed)
Addended by: Elmer Picker on: 01/02/2019 03:07 PM   Modules accepted: Orders

## 2019-01-05 NOTE — Progress Notes (Signed)
[facility] [place],  [place] [address] Phone: [phone] Subjective:   I Calvin [name] am serving as a Education administrator for Dr. Hulan Saas.    CC: Left knee pain  TKW:[id number]  Calvin [name] is a 73 y.o. male coming in with complaint of left knee pain. Has had injection in his knee. Has been walking up and down stairs a lot helping his daughter with her townhouse. Wears compression sleeve.  Onset- Chronic  Location - knee cap Character- dull pain with no activity, sharp pain with activity Aggravating factors- stairs, kneeling  Severity-6 out of 10     Past Medical History:  Diagnosis Date  . Benign prostatic hypertrophy   . ED (erectile dysfunction)   . Headache(784.0)   . Hypertension   . Hypogonadism male   . Nephrolithiasis    hx of had lithotripsy in 1-10.   Past Surgical History:  Procedure Laterality Date  . colonoscopy  03-28-10   per Dr. Fuller Plan, hyperplastic polyps, repeat in 10 yrs   . LAPAROSCOPIC APPENDECTOMY  03/12/2012   Procedure: APPENDECTOMY LAPAROSCOPIC;  Surgeon: Harl Bowie, MD;  Location: [facility];  Service: General;  Laterality: N/A;  . Levert Feinstein HEMORRHOIDECTOMY  04-14-10   per Dr. Johney Maine   . TONSILLECTOMY     Social History   Socioeconomic History  . Marital status: Widowed    Spouse name: Not on file  . Number of children: Not on file  . Years of education: Not on file  . Highest education level: Not on file  Occupational History  . Not on file  Social Needs  . Financial resource strain: Not on file  . Food insecurity    Worry: Not on file    Inability: Not on file  . Transportation needs    Medical: Not on file    Non-medical: Not on file  Tobacco Use  . Smoking status: Never Smoker  . Smokeless tobacco: Never Used  Substance and Sexual Activity  . Alcohol use: Yes    Alcohol/week: 0.0 standard drinks    Comment: "to much" drinks vodka   . Drug use: No  . Sexual activity: Not on file   Lifestyle  . Physical activity    Days per week: Not on file    Minutes per session: Not on file  . Stress: Not on file  Relationships  . Social Herbalist on phone: Not on file    Gets together: Not on file    Attends religious service: Not on file    Active member of club or organization: Not on file    Attends meetings of clubs or organizations: Not on file    Relationship status: Not on file  Other Topics Concern  . Not on file  Social History Narrative  . Not on file   No Known Allergies Family History  Problem Relation Age of Onset  . Hypertension Other   . Stroke Other   . Heart disease Other        rheumatic   . Coronary artery disease Other      Current Outpatient Medications (Cardiovascular):  .  amLODipine (NORVASC) 5 MG tablet, TAKE 1 TABLET(5 MG) BY MOUTH DAILY .  atorvastatin (LIPITOR) 20 MG tablet, Take 1 tablet (20 mg total) by mouth daily. .  hydrochlorothiazide (HYDRODIURIL) 25 MG tablet, TAKE 1 TABLET(25 MG) BY MOUTH DAILY .  losartan (COZAAR) 100 MG tablet, TAKE 1 TABLET(100 MG) BY  MOUTH DAILY     Current Outpatient Medications (Other):  Marland Kitchen  Coenzyme Q10 (CO Q-10) 100 MG CAPS, Take by mouth daily. .  diclofenac sodium (VOLTAREN) 1 % GEL, Apply topically to affected area qid .  doxycycline (MONODOX) 50 MG capsule, Take 50 mg by mouth daily.  .  finasteride (PROSCAR) 5 MG tablet, TAKE 1 TABLET(5 MG) BY MOUTH DAILY .  tamsulosin (FLOMAX) 0.4 MG CAPS capsule, TAKE 1 CAPSULE BY MOUTH DAILY    Past medical history, social, surgical and family history all reviewed in electronic medical record.  No pertanent information unless stated regarding to the chief complaint.   Review of Systems:  No headache, visual changes, nausea, vomiting, diarrhea, constipation, dizziness, abdominal pain, skin rash, fevers, chills, night sweats, weight loss, swollen lymph nodes, body aches, joint swelling, , chest pain, shortness of breath, mood changes.  Positive  muscle aches  Objective  Blood pressure 102/70, pulse 92, height 5\' 11"  (1.803 m), weight 227 lb (103 kg), SpO2 97 %.    General: No apparent distress alert and oriented x3 mood and affect normal, dressed appropriately.  HEENT: Pupils equal, extraocular movements intact  Respiratory: Patient's speak in full sentences and does not appear short of breath  Cardiovascular: No lower extremity edema, non tender, no erythema  Skin: Warm dry intact with no signs of infection or rash on extremities or on axial skeleton.  Abdomen: Soft nontender  Neuro: Cranial nerves II through XII are intact, neurovascularly intact in all extremities with 2+ DTRs and 2+ pulses.  Lymph: No lymphadenopathy of posterior or anterior cervical chain or axillae bilaterally.  Gait antalgic MSK:  Non tender with full range of motion and good stability and symmetric strength and tone of shoulders, elbows, wrist, hip and ankles bilaterally.  Knee: Left valgus deformity noted. Large thigh to calf ratio.  Trace effusion noted Tender to palpation over medial and PF joint line.  ROM full in flexion and extension and lower leg rotation. instability with valgus force.  painful patellar compression. Patellar glide with mild crepitus. Patellar and quadriceps tendons unremarkable. Hamstring and quadriceps strength is normal. Contralateral knee shows mild arthritic changes  After informed written and verbal consent, patient was seated on exam table. Left knee was prepped with alcohol swab and utilizing anterolateral approach, patient's left knee space was injected with 4:1  marcaine 0.5%: Kenalog 40mg /dL. Patient tolerated the procedure well without immediate complications.    Impression and Recommendations:     This case required medical decision making of moderate complexity. The above documentation has been reviewed and is accurate and complete Lyndal Pulley, DO       Note: This dictation was prepared with Dragon  dictation along with smaller phrase technology. Any transcriptional errors that result from this process are unintentional.

## 2019-01-06 ENCOUNTER — Ambulatory Visit (INDEPENDENT_AMBULATORY_CARE_PROVIDER_SITE_OTHER): Payer: PPO | Admitting: Family Medicine

## 2019-01-06 ENCOUNTER — Encounter: Payer: Self-pay | Admitting: Family Medicine

## 2019-01-06 ENCOUNTER — Other Ambulatory Visit: Payer: Self-pay

## 2019-01-06 DIAGNOSIS — M1712 Unilateral primary osteoarthritis, left knee: Secondary | ICD-10-CM

## 2019-01-06 DIAGNOSIS — M170 Bilateral primary osteoarthritis of knee: Secondary | ICD-10-CM | POA: Diagnosis not present

## 2019-01-06 NOTE — Patient Instructions (Signed)
Good to see you Voltaren gel over the counter  Vitamin D 2,000 IU daily See me again in about 6 weeks

## 2019-01-06 NOTE — Assessment & Plan Note (Signed)
Injected today.  Tolerated the procedure well.  Discussed need to pull lite for stability of the kneecap.  Likely has a degenerative meniscal tear on the medial aspect based on exam today as well.  We discussed viscosupplementation as a potential.  Discussed icing regimen and home exercises.  Discussed topical anti-inflammatories over-the-counter.  Follow-up with me again in 4 to 8 weeks.

## 2019-01-09 ENCOUNTER — Ambulatory Visit: Payer: PPO | Admitting: Podiatry

## 2019-01-09 ENCOUNTER — Ambulatory Visit (INDEPENDENT_AMBULATORY_CARE_PROVIDER_SITE_OTHER): Payer: PPO

## 2019-01-09 ENCOUNTER — Other Ambulatory Visit: Payer: Self-pay

## 2019-01-09 ENCOUNTER — Encounter: Payer: Self-pay | Admitting: Podiatry

## 2019-01-09 VITALS — BP 167/110 | HR 70 | Temp 97.3°F | Resp 16

## 2019-01-09 DIAGNOSIS — M205X1 Other deformities of toe(s) (acquired), right foot: Secondary | ICD-10-CM | POA: Diagnosis not present

## 2019-01-09 DIAGNOSIS — M778 Other enthesopathies, not elsewhere classified: Secondary | ICD-10-CM

## 2019-01-09 DIAGNOSIS — M779 Enthesopathy, unspecified: Secondary | ICD-10-CM

## 2019-01-09 NOTE — Patient Instructions (Signed)
Hallux Rigidus  Hallux rigidus is a type of joint pain or joint disease (arthritis) that affects your big toe (hallux). This condition involves the joint that connects the base of your big toe to the main part of your foot (metatarsophalangeal joint or MTP joint). This condition can cause your big toe to become stiff, painful, and difficult to move. Symptoms may get worse with movement or in cold or damp weather. The condition gets worse over time. What are the causes? This condition may be caused by having a foot that does not function the way that it should or that has an abnormal shape (structural deformity). These foot problems can run in families and may be passed down from parents to children (are hereditary). This condition can also be caused by:  Injury.  Overuse.  Certain inflammatory diseases, including gout and rheumatoid arthritis. What increases the risk? You are more likely to develop this condition if you have:  A foot bone (metatarsal) that is longer or higher than normal.  A family history of hallux rigidus.  Previously injured your big toe.  Feet that do not have a curve (arch) on the inner side of the foot. This may be called flat feet or fallen arches.  Ankles that turn in when you walk (pronation).  Rheumatoid arthritis or gout.  A job that requires you to stoop down often at work. What are the signs or symptoms? Symptoms of this condition include:  Big toe pain.  Stiffness and difficulty moving the big toe.  Swelling of the toe and surrounding area.  Bone spurs. These are bony growths that can form on the joint of the big toe.  A limp. How is this diagnosed? This condition is diagnosed based on your medical history and a physical exam. You may also have X-rays. How is this treated? This condition is treated by:  Wearing roomy, comfortable shoes that have a large toe box.  Putting orthotic devices in your shoes.  Taking pain medicines.  Having  physical therapy.  Icing the injured area.  Alternating between putting your foot in cold water and then in warm water. If your condition is severe, treatment may include:  Corticosteroid injections to relieve pain.  Surgery to remove bone spurs, fuse damaged bones together, or replace the entire joint. Follow these instructions at home: Managing pain, stiffness, and swelling   Put your feet in cold water for 30 seconds, and then in warm water for 30 seconds. Alternate between the cold and warm water for 5 minutes. Do this several times a day or as told by your health care provider.  If directed, put ice on the injured area. ? Put ice in a plastic bag. ? Place a towel between your skin and the bag. ? Leave the ice on for 20 minutes, 2-3 times a day. General instructions  Take over-the-counter and prescription medicines only as told by your health care provider.  Do not wear high heels or other restrictive footwear. Wear comfortable, supportive shoes that have a large toe box.  Wear shoe inserts (orthotics) as told by your health care provider, if this applies.  Do foot exercises as instructed by your health care provider or a physical therapist.  Keep all follow-up visits as told by your health care provider. This is important. Contact a health care provider if:  You notice bone spurs or growths on or around your big toe.  Your pain does not get better or it gets worse.  You have  pain while resting.  You have pain in other parts of your body, such as your back, hip, or knee.  You start to limp. Summary  Hallux rigidus is a condition that makes your big toe become stiff, painful, and difficult to move.  It can be caused by injury, overuse, or inflammatory diseases.  This condition may be treated with ice, medicines, physical therapy, and surgery.  Do not wear high heels or other restrictive footwear. Wear comfortable, supportive shoes that have a large toe box. This  information is not intended to replace advice given to you by your health care provider. Make sure you discuss any questions you have with your health care provider. Document Released: 06/04/2005 Document Revised: 03/14/2018 Document Reviewed: 03/17/2018 Elsevier Patient Education  2020 [employer].

## 2019-01-09 NOTE — Progress Notes (Signed)
   Subjective:    Patient ID: Calvin [name], male    DOB: 1945/07/22, 73 y.o.   [name]: [id number]  HPI    Review of Systems  All other systems reviewed and are negative.      Objective:   Physical Exam        Assessment & Plan:

## 2019-01-14 NOTE — Progress Notes (Signed)
Subjective:   Patient ID: Calvin [name], male   DOB: 73 y.o.   [name]: [id number]   HPI Patient presents with chronic discomfort of the big toe joint right stating that it is been hurting him for a fairly long time and is worsened over the last 6 months.  States it is sore to walk on and is tried different modalities without relief and patient does not smoke likes to be active   Review of Systems  All other systems reviewed and are negative.       Objective:  Physical Exam Vitals signs and nursing note reviewed.  Constitutional:      Appearance: He is well-developed.  Pulmonary:     Effort: Pulmonary effort is normal.  Musculoskeletal: Normal range of motion.  Skin:    General: Skin is warm.  Neurological:     Mental Status: He is alert.     Neurovascular status found to be intact muscle strength found to be adequate range of motion was within normal limits with diminished range of motion and swelling around the first MPJ right with quite a bit of pain with palpation and obvious indications of spur formation.  Patient is found to have good digital perfusion well oriented x3 and does have moderate crepitus of the joint with movement with mild discomfort left but not to the same degree     Assessment:  Probability for hallux limitus rigidus deformity right with structural changes the joint spur formation over left     Plan:  H&P x-rays reviewed condition discussed at great length.  I do think this will require surgical intervention I did educate him on this and we discussed shoe gear modifications and not going barefoot.  Patient will be seen back 4 weeks and I answered all questions today concerning surgical intervention hallux limitus rigidus deformity  X-rays indicate there is quite a bit of spurring at the first MPJ right over left with narrowing of the joint surface and elevation of the first metatarsal bilateral

## 2019-01-21 ENCOUNTER — Other Ambulatory Visit: Payer: Self-pay | Admitting: Family Medicine

## 2019-02-02 ENCOUNTER — Other Ambulatory Visit: Payer: Self-pay | Admitting: Family Medicine

## 2019-02-06 ENCOUNTER — Encounter: Payer: Self-pay | Admitting: Podiatry

## 2019-02-06 ENCOUNTER — Other Ambulatory Visit: Payer: Self-pay

## 2019-02-06 ENCOUNTER — Ambulatory Visit (INDEPENDENT_AMBULATORY_CARE_PROVIDER_SITE_OTHER): Payer: PPO | Admitting: Podiatry

## 2019-02-06 ENCOUNTER — Telehealth: Payer: Self-pay | Admitting: *Deleted

## 2019-02-06 VITALS — Temp 97.2°F

## 2019-02-06 DIAGNOSIS — M205X1 Other deformities of toe(s) (acquired), right foot: Secondary | ICD-10-CM | POA: Diagnosis not present

## 2019-02-06 NOTE — Telephone Encounter (Signed)
"  [name] said his surgery is supposed to be scheduled for September 8 not September 1."  I have him down for February 17, 2019.  I'll call him.  Patient had a visit today and rescheduled his surgery to 02/24/2019.  I called and informed Caren Griffins and I rescheduled it via the surgical center's [facility].

## 2019-02-06 NOTE — Patient Instructions (Signed)
Pre-Operative Instructions  Congratulations, you have decided to take an important step towards improving your quality of life.  You can be assured that the doctors and staff at [facility] will be with you every step of the way.  Here are some important things you should know:  1. Plan to be at the surgery center/hospital at least 1 (one) hour prior to your scheduled time, unless otherwise directed by the surgical center/hospital staff.  You must have a responsible adult accompany you, remain during the surgery and drive you home.  Make sure you have directions to the surgical center/hospital to ensure you arrive on time. 2. If you are having surgery at [facility] or [facility] hospitals, you will need a copy of your medical history and physical form from your family physician within one month prior to the date of surgery. We will give you a form for your primary physician to complete.  3. We make every effort to accommodate the date you request for surgery.  However, there are times where surgery dates or times have to be moved.  We will contact you as soon as possible if a change in schedule is required.   4. No aspirin/ibuprofen for one week before surgery.  If you are on aspirin, any non-steroidal anti-inflammatory medications (Mobic, Aleve, Ibuprofen) should not be taken seven (7) days prior to your surgery.  You make take Tylenol for pain prior to surgery.  5. Medications - If you are taking daily heart and blood pressure medications, seizure, reflux, allergy, asthma, anxiety, pain or diabetes medications, make sure you notify the surgery center/hospital before the day of surgery so they can tell you which medications you should take or avoid the day of surgery. 6. No food or drink after midnight the night before surgery unless directed otherwise by surgical center/hospital staff. 7. No alcoholic beverages 24-hours prior to surgery.  No smoking 24-hours prior or 24-hours after  surgery. 8. Wear loose pants or shorts. They should be loose enough to fit over bandages, boots, and casts. 9. Don't wear slip-on shoes. Sneakers are preferred. 10. Bring your boot with you to the surgery center/hospital.  Also bring crutches or a walker if your physician has prescribed it for you.  If you do not have this equipment, it will be provided for you after surgery. 11. If you have not been contacted by the surgery center/hospital by the day before your surgery, call to confirm the date and time of your surgery. 12. Leave-time from work may vary depending on the type of surgery you have.  Appropriate arrangements should be made prior to surgery with your employer. 13. Prescriptions will be provided immediately following surgery by your doctor.  Fill these as soon as possible after surgery and take the medication as directed. Pain medications will not be refilled on weekends and must be approved by the doctor. 14. Remove nail polish on the operative foot and avoid getting pedicures prior to surgery. 15. Wash the night before surgery.  The night before surgery wash the foot and leg well with water and the antibacterial soap provided. Be sure to pay special attention to beneath the toenails and in between the toes.  Wash for at least three (3) minutes. Rinse thoroughly with water and dry well with a towel.  Perform this wash unless told not to do so by your physician.  Enclosed: 1 Ice pack (please put in freezer the night before surgery)   1 Hibiclens skin cleaner     Pre-op instructions  If you have any questions regarding the instructions, please do not hesitate to call our office.  [place]: [address], [place], [place] [address] -- [phone]  [place]: [address] [place], [place] [address] -- [phone]  [place]: [address]  [place], [place] [address] -- [phone]  [place]: [address], [address], [place],  [address] -- [phone]  Website:  [email or web address] 

## 2019-02-06 NOTE — Progress Notes (Signed)
Subjective:   Patient ID: Calvin [name], male   DOB: 73 y.o.   [name]: [id number]   HPI Patient presents stating the big toe joint needs to be corrected right I thought about it and it hurts me a lot and hip surgery   ROS      Objective:  Physical Exam  Neurovascular status intact with inflammation pain of the right first MPJ with fluid buildup around the joint surface moderate reduction of motion crepitus noted within the joint itself     Assessment:  Hallux limitus condition right with possibility for cartilage damage or possibility for abnormal position of the first metatarsal     Plan:  H&P discussed condition and reviewed treatment options.  I discussed the possibility for just bone spur removal versus osteotomy versus implant procedure and explained I will not know the answer until he can get into surgery.  He understands this completely and I allowed him to read and then signed consent form after review and he is scheduled for outpatient procedure.  Patient understands no guarantee as far as success of procedure and all complications outlined in the consent form and is willing to accept risk and is scheduled for outpatient surgery.  Encouraged to call with any questions concerns he may have and understands total recovery can take 6 months to 1 year and may require ultimate fusion of the joint and today I did dispense air fracture walker which I want him to get used to prior to surgery and will be used during the postoperative.

## 2019-02-16 NOTE — Progress Notes (Signed)
[facility] [place], [place] [address] Phone: [phone] Subjective:   Calvin [name], am serving as a scribe for Dr. Hulan Saas.    CC: Left knee pain follow-up  [id number]   01/06/2019 Injected today.  Tolerated the procedure well.  Discussed need to pull lite for stability of the kneecap.  Likely has a degenerative meniscal tear on the medial aspect based on exam today as well.  We discussed viscosupplementation as a potential.  Discussed icing regimen and home exercises.  Discussed topical anti-inflammatories over-the-counter.  Follow-up with me again in 4 to 8 weeks.  Update 02/17/2019 Calvin [name] is a 73 y.o. male coming in with complaint of left knee pain. Did wear the brace for longer walks. Has discontinued use of brace though as he isn't being active and is about to undergo foot surgery on 02/24/2019.  Injection did help to decrease pain in his knee. Pain over patellar tendon continues with stairs.  Patient states that overall though about 75% better.  Knows he will not be walking in the near future.  Having the surgery on his foot.  Past Medical History:  Diagnosis Date  . Benign prostatic hypertrophy   . ED (erectile dysfunction)   . Headache(784.0)   . Hypertension   . Hypogonadism male   . Nephrolithiasis    hx of had lithotripsy in 1-10.   Past Surgical History:  Procedure Laterality Date  . colonoscopy  03-28-10   per Dr. Fuller Plan, hyperplastic polyps, repeat in 10 yrs   . LAPAROSCOPIC APPENDECTOMY  03/12/2012   Procedure: APPENDECTOMY LAPAROSCOPIC;  Surgeon: Harl Bowie, MD;  Location: [facility];  Service: General;  Laterality: N/A;  . Levert Feinstein HEMORRHOIDECTOMY  04-14-10   per Dr. Johney Maine   . TONSILLECTOMY     Social History   Socioeconomic History  . Marital status: Widowed    Spouse name: Not on file  . Number of children: Not on file  . Years of education: Not on file  . Highest education level: Not on file   Occupational History  . Not on file  Social Needs  . Financial resource strain: Not on file  . Food insecurity    Worry: Not on file    Inability: Not on file  . Transportation needs    Medical: Not on file    Non-medical: Not on file  Tobacco Use  . Smoking status: Never Smoker  . Smokeless tobacco: Never Used  Substance and Sexual Activity  . Alcohol use: Yes    Alcohol/week: 0.0 standard drinks    Comment: "to much" drinks vodka   . Drug use: [name]  . Sexual activity: Not on file  Lifestyle  . Physical activity    Days per week: Not on file    Minutes per session: Not on file  . Stress: Not on file  Relationships  . Social Herbalist on phone: Not on file    Gets together: Not on file    Attends religious service: Not on file    Active member of club or organization: Not on file    Attends meetings of clubs or organizations: Not on file    Relationship status: Not on file  Other Topics Concern  . Not on file  Social History Narrative  . Not on file   [name] Known Allergies Family History  Problem Relation Age of Onset  . Hypertension Other   . Stroke Other   .  Heart disease Other        rheumatic   . Coronary artery disease Other      Current Outpatient Medications (Cardiovascular):  .  amLODipine (NORVASC) 5 MG tablet, TAKE 1 TABLET(5 MG) BY MOUTH DAILY .  atorvastatin (LIPITOR) 20 MG tablet, Take 1 tablet (20 mg total) by mouth daily. .  hydrochlorothiazide (HYDRODIURIL) 25 MG tablet, TAKE 1 TABLET(25 MG) BY MOUTH DAILY .  losartan (COZAAR) 100 MG tablet, TAKE 1 TABLET(100 MG) BY MOUTH DAILY     Current Outpatient Medications (Other):  Marland Kitchen  Coenzyme Q10 (CO Q-10) 100 MG CAPS, Take by mouth daily. .  diclofenac sodium (VOLTAREN) 1 % GEL, Apply topically to affected area qid .  doxycycline (MONODOX) 50 MG capsule, Take 50 mg by mouth daily.  .  finasteride (PROSCAR) 5 MG tablet, TAKE 1 TABLET(5 MG) BY MOUTH DAILY .  tamsulosin (FLOMAX) 0.4 MG CAPS  capsule, TAKE 1 CAPSULE BY MOUTH DAILY    Past medical history, social, surgical and family history all reviewed in electronic medical record.  [name] pertanent information unless stated regarding to the chief complaint.   Review of Systems:  [name] headache, visual changes, nausea, vomiting, diarrhea, constipation, dizziness, abdominal pain, skin rash, fevers, chills, night sweats, weight loss, swollen lymph nodes, body aches, joint swelling, muscle aches, chest pain, shortness of breath, mood changes.   Objective  Blood pressure 92/60, pulse (!) 104, height 5\' 11"  (1.803 m), weight 221 lb (100.2 kg), SpO2 96 %.    General: [name] apparent distress alert and oriented x3 mood and affect normal, dressed appropriately.  HEENT: Pupils equal, extraocular movements intact  Respiratory: Patient's speak in full sentences and does not appear short of breath  Cardiovascular: [name] lower extremity edema, non tender, [name] erythema  Skin: Warm dry intact with [name] signs of infection or rash on extremities or on axial skeleton.  Abdomen: Soft nontender  Neuro: Cranial nerves II through XII are intact, neurovascularly intact in all extremities with 2+ DTRs and 2+ pulses.  Lymph: [name] lymphadenopathy of posterior or anterior cervical chain or axillae bilaterally.  Gait mild antalgic MSK:   Tenderness noted.  Crepitus noted.  Patient does have a positive patellar grind.  Improvement with McMurray's.   Impression and Recommendations:     This case required medical decision making of moderate complexity. The above documentation has been reviewed and is accurate and complete Calvin Pulley, DO       Note: This dictation was prepared with Dragon dictation along with smaller phrase technology. Any transcriptional errors that result from this process are unintentional.

## 2019-02-17 ENCOUNTER — Other Ambulatory Visit: Payer: Self-pay

## 2019-02-17 ENCOUNTER — Encounter: Payer: Self-pay | Admitting: Family Medicine

## 2019-02-17 ENCOUNTER — Ambulatory Visit (INDEPENDENT_AMBULATORY_CARE_PROVIDER_SITE_OTHER): Payer: PPO | Admitting: Family Medicine

## 2019-02-17 DIAGNOSIS — M1712 Unilateral primary osteoarthritis, left knee: Secondary | ICD-10-CM

## 2019-02-17 NOTE — Assessment & Plan Note (Signed)
Mostly in the patellofemoral area.  Patient is failed all other conservative therapies.  I do believe the patient could be a candidate for Visco supplementation.  We will get approval for this.  Encourage patient continue the home exercises and icing regimen.  Discussed which activities to doing which wants to avoid.  Follow-up again in 4 to 8 weeks.

## 2019-02-17 NOTE — Patient Instructions (Signed)
Good luck with surgery See you in 6 weeks for possible visco injection

## 2019-02-19 ENCOUNTER — Telehealth: Payer: Self-pay | Admitting: *Deleted

## 2019-02-19 NOTE — Telephone Encounter (Signed)
DOS 02/24/2019 [facility] BI-PLANAR - [id number] OR [facility] BUNION IMPLANT - [phone] RIGHT FOOT  HEALTH TEAM ADVANTAGE: Effective Date - 06/18/2017  Deductible - $0 Co-pay - $225 facility Co-insurance - 80% / 20% Medicare guidelines Out of pocket - [id number]  Pre-certification is NOT REQUIRED. Call ref# [id number]

## 2019-02-24 ENCOUNTER — Encounter: Payer: Self-pay | Admitting: Podiatry

## 2019-02-24 DIAGNOSIS — I10 Essential (primary) hypertension: Secondary | ICD-10-CM | POA: Diagnosis not present

## 2019-02-24 DIAGNOSIS — M2021 Hallux rigidus, right foot: Secondary | ICD-10-CM | POA: Diagnosis not present

## 2019-02-24 DIAGNOSIS — M2011 Hallux valgus (acquired), right foot: Secondary | ICD-10-CM | POA: Diagnosis not present

## 2019-02-26 ENCOUNTER — Other Ambulatory Visit: Payer: Self-pay | Admitting: Family Medicine

## 2019-03-02 ENCOUNTER — Ambulatory Visit (INDEPENDENT_AMBULATORY_CARE_PROVIDER_SITE_OTHER): Payer: PPO | Admitting: Podiatry

## 2019-03-02 ENCOUNTER — Other Ambulatory Visit: Payer: Self-pay

## 2019-03-02 ENCOUNTER — Encounter: Payer: Self-pay | Admitting: Podiatry

## 2019-03-02 ENCOUNTER — Ambulatory Visit (INDEPENDENT_AMBULATORY_CARE_PROVIDER_SITE_OTHER): Payer: PPO

## 2019-03-02 VITALS — BP 117/80 | HR 93 | Temp 97.8°F | Resp 16

## 2019-03-02 DIAGNOSIS — M205X1 Other deformities of toe(s) (acquired), right foot: Secondary | ICD-10-CM | POA: Diagnosis not present

## 2019-03-02 DIAGNOSIS — H43811 Vitreous degeneration, right eye: Secondary | ICD-10-CM | POA: Diagnosis not present

## 2019-03-02 NOTE — Progress Notes (Signed)
Subjective:   Patient ID: Calvin [name], male   DOB: 73 y.o.   [name]: [id number]   HPI Patient presents stating doing well with my right foot and states that he is very pleased so far with surgery   ROS      Objective:  Physical Exam  Neurovascular status intact with patient's right foot healing well wound edges well coapted hallux in rectus position good range of motion without crepitus     Assessment:  Doing well post bunion procedure right first metatarsal     Plan:  Advised on continued elevation compression immobilization and reapplied sterile dressing and will be seen back again in the next 3 weeks and dispensed surgical shoe Ace wrap at this time  X-ray indicates satisfactory resection of bone with good alignment noted sign visit

## 2019-03-23 ENCOUNTER — Other Ambulatory Visit: Payer: Self-pay

## 2019-03-23 ENCOUNTER — Ambulatory Visit (INDEPENDENT_AMBULATORY_CARE_PROVIDER_SITE_OTHER): Payer: PPO | Admitting: Podiatry

## 2019-03-23 ENCOUNTER — Ambulatory Visit (INDEPENDENT_AMBULATORY_CARE_PROVIDER_SITE_OTHER): Payer: PPO

## 2019-03-23 ENCOUNTER — Encounter: Payer: Self-pay | Admitting: Podiatry

## 2019-03-23 DIAGNOSIS — Z09 Encounter for follow-up examination after completed treatment for conditions other than malignant neoplasm: Secondary | ICD-10-CM

## 2019-03-23 DIAGNOSIS — M205X1 Other deformities of toe(s) (acquired), right foot: Secondary | ICD-10-CM

## 2019-03-23 NOTE — Progress Notes (Signed)
Subjective:   Patient ID: Calvin [name], male   DOB: 73 y.o.   [name]: [id number]   HPI Patient states overall feeling good with minimal discomfort and swelling   ROS      Objective:  Physical Exam  Neurovascular status intact with patient's right first MPJ healing well wound edges well coapted well good range of motion noted     Assessment:  Doing well post hallux limitus repair right     Plan:  H&P reviewed condition recommended continued elevation compression and gradual increase in activity and dispensed ankle compression stocking  X-ray indicates that the bone structure looks good joint congruence bone spur removed satisfactory signed visit

## 2019-03-24 DIAGNOSIS — H524 Presbyopia: Secondary | ICD-10-CM | POA: Diagnosis not present

## 2019-03-29 NOTE — Progress Notes (Signed)
[name] [address] [place], [place] [address] Phone: [phone] Subjective:   Calvin [name], am serving as a scribe for Dr. Hulan Saas.  I'm seeing this patient by the request  of:    CC: Left knee pain follow-up  [id number]   02/17/2019 Mostly in the patellofemoral area.  Patient is failed all other conservative therapies.  I do believe the patient could be a candidate for Visco supplementation.  We will get approval for this.  Encourage patient continue the home exercises and icing regimen.  Discussed which activities to doing which wants to avoid.  Follow-up again in 4 to 8 weeks.  Update 03/31/2019 Calvin [name] is a 73 y.o. male coming in with complaint of left knee pain. Had surgery 3 weeks ago on his right foot. Bone spur removal. Knee pain has subsided but he has not used his leg. Did have some resolution from injection July.   Left knee pain was injected previously in July approval for Visco supplementation obtained relatively well but patient now administered due to feeling like he has not been doing as much activity since the knee injury.  Past Medical History:  Diagnosis Date  . Benign prostatic hypertrophy   . ED (erectile dysfunction)   . Headache(784.0)   . Hypertension   . Hypogonadism male   . Nephrolithiasis    hx of had lithotripsy in 1-10.   Past Surgical History:  Procedure Laterality Date  . colonoscopy  03-28-10   per Dr. Fuller Plan, hyperplastic polyps, repeat in 10 yrs   . LAPAROSCOPIC APPENDECTOMY  03/12/2012   Procedure: APPENDECTOMY LAPAROSCOPIC;  Surgeon: Harl Bowie, MD;  Location: [facility];  Service: General;  Laterality: N/A;  . Levert Feinstein HEMORRHOIDECTOMY  04-14-10   per Dr. Johney Maine   . TONSILLECTOMY     Social History   Socioeconomic History  . Marital status: Widowed    Spouse name: Not on file  . Number of children: Not on file  . Years of education: Not on file  . Highest education level: Not on  file  Occupational History  . Not on file  Social Needs  . Financial resource strain: Not on file  . Food insecurity    Worry: Not on file    Inability: Not on file  . Transportation needs    Medical: Not on file    Non-medical: Not on file  Tobacco Use  . Smoking status: Never Smoker  . Smokeless tobacco: Never Used  Substance and Sexual Activity  . Alcohol use: Yes    Alcohol/week: 0.0 standard drinks    Comment: "to much" drinks vodka   . Drug use: [name]  . Sexual activity: Not on file  Lifestyle  . Physical activity    Days per week: Not on file    Minutes per session: Not on file  . Stress: Not on file  Relationships  . Social Herbalist on phone: Not on file    Gets together: Not on file    Attends religious service: Not on file    Active member of club or organization: Not on file    Attends meetings of clubs or organizations: Not on file    Relationship status: Not on file  Other Topics Concern  . Not on file  Social History Narrative  . Not on file   [name] Known Allergies Family History  Problem Relation Age of Onset  . Hypertension Other   .  Stroke Other   . Heart disease Other        rheumatic   . Coronary artery disease Other      Current Outpatient Medications (Cardiovascular):  .  amLODipine (NORVASC) 5 MG tablet, TAKE 1 TABLET(5 MG) BY MOUTH DAILY .  atorvastatin (LIPITOR) 20 MG tablet, Take 1 tablet (20 mg total) by mouth daily. .  hydrochlorothiazide (HYDRODIURIL) 25 MG tablet, TAKE 1 TABLET(25 MG) BY MOUTH DAILY .  losartan (COZAAR) 100 MG tablet, TAKE 1 TABLET(100 MG) BY MOUTH DAILY   Current Outpatient Medications (Analgesics):  .  HYDROcodone-acetaminophen (NORCO) 10-325 MG tablet, TK 1 T PO Q 6 H PRN P   Current Outpatient Medications (Other):  Marland Kitchen  Coenzyme Q10 (CO Q-10) 100 MG CAPS, Take by mouth daily. .  diclofenac sodium (VOLTAREN) 1 % GEL, Apply topically to affected area qid .  doxycycline (MONODOX) 50 MG capsule, Take 50  mg by mouth daily.  .  finasteride (PROSCAR) 5 MG tablet, TAKE 1 TABLET(5 MG) BY MOUTH DAILY .  tamsulosin (FLOMAX) 0.4 MG CAPS capsule, TAKE 1 CAPSULE BY MOUTH DAILY    Past medical history, social, surgical and family history all reviewed in electronic medical record.  [name] pertanent information unless stated regarding to the chief complaint.   Review of Systems:  [name] headache, visual changes, nausea, vomiting, diarrhea, constipation, dizziness, abdominal pain, skin rash, fevers, chills, night sweats, weight loss, swollen lymph nodes, body aches, joint swelling, muscle aches, chest pain, shortness of breath, mood changes.   Objective  Blood pressure 112/68, pulse (!) 102, height 5\' 11"  (1.803 m), weight 221 lb (100.2 kg), SpO2 98 %.   General: [name] apparent distress alert and oriented x3 mood and affect normal, dressed appropriately.  HEENT: Pupils equal, extraocular movements intact  Respiratory: Patient's speak in full sentences and does not appear short of breath  Cardiovascular: [name] lower extremity edema, non tender, [name] erythema  Skin: Warm dry intact with [name] signs of infection or rash on extremities or on axial skeleton.  Abdomen: Soft nontender  Neuro: Cranial nerves II through XII are intact, neurovascularly intact in all extremities with 2+ DTRs and 2+ pulses.  Lymph: [name] lymphadenopathy of posterior or anterior cervical chain or axillae bilaterally.  Gait antalgic MSK:  Non tender with full range of motion and good stability and symmetric strength and tone of shoulders, elbows, wrist, hip and ankles bilaterally.  Knee: Left valgus deformity noted. Large thigh to calf ratio.  Tender to palpation over medial and PF joint line.  ROM full in flexion and extension and lower leg rotation. instability with valgus force.  painful patellar compression. Patellar glide with moderate crepitus. Patellar and quadriceps tendons unremarkable. Hamstring and quadriceps strength is normal.  Contralateral knee shows mild arthritic changes  After informed written and verbal consent, patient was seated on exam table. Left knee was prepped with alcohol swab and utilizing anterolateral approach, patient's left knee space was injected with 20 mg/mL of Monovisc (sodium hyaluronate) in a prefilled syringe was injected easily into the knee through a 22-gauge needle..Patient tolerated the procedure well without immediate complications.   Impression and Recommendations:     This case required medical decision making of moderate complexity. The above documentation has been reviewed and is accurate and complete Lyndal Pulley, DO       Note: This dictation was prepared with Dragon dictation along with smaller phrase technology. Any transcriptional errors that result from this process are unintentional.

## 2019-03-30 ENCOUNTER — Other Ambulatory Visit: Payer: PPO

## 2019-03-31 ENCOUNTER — Ambulatory Visit (INDEPENDENT_AMBULATORY_CARE_PROVIDER_SITE_OTHER): Payer: PPO | Admitting: Family Medicine

## 2019-03-31 ENCOUNTER — Other Ambulatory Visit: Payer: Self-pay

## 2019-03-31 ENCOUNTER — Encounter: Payer: Self-pay | Admitting: Family Medicine

## 2019-03-31 DIAGNOSIS — M1712 Unilateral primary osteoarthritis, left knee: Secondary | ICD-10-CM

## 2019-03-31 NOTE — Assessment & Plan Note (Signed)
Viscosupplementation given today, tolerated the procedure well, patient will be leaving the state for the next 3 months.  Patient was to increase activity as tolerated.  Follow-up with me again in 6 months.  Continue all other conservative therapy.

## 2019-04-16 ENCOUNTER — Other Ambulatory Visit: Payer: Self-pay | Admitting: Family Medicine

## 2019-04-29 ENCOUNTER — Other Ambulatory Visit: Payer: Self-pay | Admitting: Family Medicine

## 2019-05-04 ENCOUNTER — Ambulatory Visit (INDEPENDENT_AMBULATORY_CARE_PROVIDER_SITE_OTHER): Payer: PPO

## 2019-05-04 ENCOUNTER — Ambulatory Visit (INDEPENDENT_AMBULATORY_CARE_PROVIDER_SITE_OTHER): Payer: PPO | Admitting: Podiatry

## 2019-05-04 ENCOUNTER — Encounter: Payer: Self-pay | Admitting: Podiatry

## 2019-05-04 ENCOUNTER — Other Ambulatory Visit: Payer: Self-pay

## 2019-05-04 DIAGNOSIS — M205X1 Other deformities of toe(s) (acquired), right foot: Secondary | ICD-10-CM

## 2019-05-04 DIAGNOSIS — M779 Enthesopathy, unspecified: Secondary | ICD-10-CM

## 2019-05-06 NOTE — Progress Notes (Signed)
Subjective:   Patient ID: Calvin [name], male   DOB: 73 y.o.   [name]: [id number]   HPI Patient states overall doing well but does still have some pain within the joint with ambulation   ROS      Objective:  Physical Exam  Neurovascular status intact with patient's first MPJ clinically appearing to function pretty well but it is inflamed around the joint surface and I did explain that we attempted not to do implant for this but it may be necessary     Assessment:  Inflammatory capsulitis around the first MPJ right which hopefully is short-term and can respond conservatively     Plan:  Sterile prep done and today I injected around the first MPJ 3 mg Dexasone Kenalog 5 mg Xylocaine and advised on reduced activity.  We will see him back in the next several months and again may end up requiring implant if symptoms persist but I am hopeful this will help his problem  X-ray indicates satisfactory resection of spur with joint looking relatively healthy but clinically still painful

## 2019-05-11 ENCOUNTER — Other Ambulatory Visit: Payer: Self-pay

## 2019-05-27 ENCOUNTER — Other Ambulatory Visit: Payer: Self-pay | Admitting: Family Medicine

## 2019-06-01 ENCOUNTER — Other Ambulatory Visit: Payer: Self-pay

## 2019-06-01 ENCOUNTER — Ambulatory Visit: Payer: PPO | Admitting: Orthotics

## 2019-06-01 DIAGNOSIS — M779 Enthesopathy, unspecified: Secondary | ICD-10-CM

## 2019-06-01 DIAGNOSIS — M205X1 Other deformities of toe(s) (acquired), right foot: Secondary | ICD-10-CM

## 2019-06-01 NOTE — Progress Notes (Signed)
Going to send back to [facility].with/ mortons extension as they will not fit into [employer]  Well.

## 2019-06-02 DIAGNOSIS — D692 Other nonthrombocytopenic purpura: Secondary | ICD-10-CM | POA: Diagnosis not present

## 2019-06-02 DIAGNOSIS — L821 Other seborrheic keratosis: Secondary | ICD-10-CM | POA: Diagnosis not present

## 2019-06-02 DIAGNOSIS — L820 Inflamed seborrheic keratosis: Secondary | ICD-10-CM | POA: Diagnosis not present

## 2019-06-02 DIAGNOSIS — Z85828 Personal history of other malignant neoplasm of skin: Secondary | ICD-10-CM | POA: Diagnosis not present

## 2019-06-02 DIAGNOSIS — C44519 Basal cell carcinoma of skin of other part of trunk: Secondary | ICD-10-CM | POA: Diagnosis not present

## 2019-06-02 DIAGNOSIS — C44311 Basal cell carcinoma of skin of nose: Secondary | ICD-10-CM | POA: Diagnosis not present

## 2019-06-02 DIAGNOSIS — D485 Neoplasm of uncertain behavior of skin: Secondary | ICD-10-CM | POA: Diagnosis not present

## 2019-06-12 ENCOUNTER — Other Ambulatory Visit: Payer: Self-pay | Admitting: Family Medicine

## 2019-06-15 ENCOUNTER — Other Ambulatory Visit: Payer: Self-pay | Admitting: Family Medicine

## 2019-06-29 ENCOUNTER — Other Ambulatory Visit: Payer: Self-pay

## 2019-06-29 ENCOUNTER — Ambulatory Visit: Payer: Medicare HMO | Admitting: Orthotics

## 2019-06-29 DIAGNOSIS — M1712 Unilateral primary osteoarthritis, left knee: Secondary | ICD-10-CM

## 2019-06-29 DIAGNOSIS — M205X1 Other deformities of toe(s) (acquired), right foot: Secondary | ICD-10-CM

## 2019-06-29 DIAGNOSIS — M779 Enthesopathy, unspecified: Secondary | ICD-10-CM

## 2019-06-29 NOTE — Progress Notes (Signed)
Patient just not satisfied that foot orthotics (with morton's extension and/or reverse morton's ) will not fit into [employer] dress shoes; he said he would rather have surgery.   I didn't put charges in b/c he was so dissatified that the f/o would not fit in desired shoe; plus he is willing to have corrective surgery.

## 2019-07-02 ENCOUNTER — Ambulatory Visit (INDEPENDENT_AMBULATORY_CARE_PROVIDER_SITE_OTHER): Payer: Medicare HMO

## 2019-07-02 ENCOUNTER — Ambulatory Visit: Payer: Medicare HMO | Admitting: Podiatry

## 2019-07-02 ENCOUNTER — Encounter: Payer: Self-pay | Admitting: Podiatry

## 2019-07-02 ENCOUNTER — Other Ambulatory Visit: Payer: Self-pay

## 2019-07-02 DIAGNOSIS — M205X1 Other deformities of toe(s) (acquired), right foot: Secondary | ICD-10-CM | POA: Diagnosis not present

## 2019-07-02 DIAGNOSIS — M79671 Pain in right foot: Secondary | ICD-10-CM | POA: Diagnosis not present

## 2019-07-02 NOTE — Progress Notes (Signed)
Subjective:   Patient ID: Calvin [name], male   DOB: 74 y.o.   [name]: [id number]   HPI Patient presents stating that the big toe joint right has been very sore since surgery and he needs to have something done with it.  States that the pain hurts with any type of walking in the medication we utilized did not help   ROS      Objective:  Physical Exam  Neurovascular status intact with patient found to have exquisite discomfort in the first MPJ right with inflammation fluid of the joint surface and pain with palpation to the joint surface     Assessment:  Hallux limitus rigidus condition right with previous spur resection which helped with dorsal pain but the joint itself has become very painful     Plan:  H&P reviewed condition and x-ray today indicating narrowing of the joint and indicating osteoarthritis.  I explained to him what happened here and that most likely with removal of spur it caused more stress on the joint surface and I do think at this point it is probably best corrected I recommended implant arthroplasty.  Patient wants to have this procedure and I allowed him to go over consent form reviewing alternative treatments complications.  Patient signed consent form after extensive review and understands all risk of surgery and that ultimately fusion may be necessary.  Patient wants surgery and is scheduled for outpatient surgery at this time and is encouraged to call with any questions concerns  X-rays indicate narrowing of the joint first MPJ with multiple signs of arthritis of the joint surface

## 2019-07-02 NOTE — Patient Instructions (Signed)
Pre-Operative Instructions  Congratulations, you have decided to take an important step towards improving your quality of life.  You can be assured that the doctors and staff at [facility] will be with you every step of the way.  Here are some important things you should know:  1. Plan to be at the surgery center/hospital at least 1 (one) hour prior to your scheduled time, unless otherwise directed by the surgical center/hospital staff.  You must have a responsible adult accompany you, remain during the surgery and drive you home.  Make sure you have directions to the surgical center/hospital to ensure you arrive on time. 2. If you are having surgery at [facility] or [facility] hospitals, you will need a copy of your medical history and physical form from your family physician within one month prior to the date of surgery. We will give you a form for your primary physician to complete.  3. We make every effort to accommodate the date you request for surgery.  However, there are times where surgery dates or times have to be moved.  We will contact you as soon as possible if a change in schedule is required.   4. No aspirin/ibuprofen for one week before surgery.  If you are on aspirin, any non-steroidal anti-inflammatory medications (Mobic, Aleve, Ibuprofen) should not be taken seven (7) days prior to your surgery.  You make take Tylenol for pain prior to surgery.  5. Medications - If you are taking daily heart and blood pressure medications, seizure, reflux, allergy, asthma, anxiety, pain or diabetes medications, make sure you notify the surgery center/hospital before the day of surgery so they can tell you which medications you should take or avoid the day of surgery. 6. No food or drink after midnight the night before surgery unless directed otherwise by surgical center/hospital staff. 7. No alcoholic beverages 24-hours prior to surgery.  No smoking 24-hours prior or 24-hours after  surgery. 8. Wear loose pants or shorts. They should be loose enough to fit over bandages, boots, and casts. 9. Don't wear slip-on shoes. Sneakers are preferred. 10. Bring your boot with you to the surgery center/hospital.  Also bring crutches or a walker if your physician has prescribed it for you.  If you do not have this equipment, it will be provided for you after surgery. 11. If you have not been contacted by the surgery center/hospital by the day before your surgery, call to confirm the date and time of your surgery. 12. Leave-time from work may vary depending on the type of surgery you have.  Appropriate arrangements should be made prior to surgery with your employer. 13. Prescriptions will be provided immediately following surgery by your doctor.  Fill these as soon as possible after surgery and take the medication as directed. Pain medications will not be refilled on weekends and must be approved by the doctor. 14. Remove nail polish on the operative foot and avoid getting pedicures prior to surgery. 15. Wash the night before surgery.  The night before surgery wash the foot and leg well with water and the antibacterial soap provided. Be sure to pay special attention to beneath the toenails and in between the toes.  Wash for at least three (3) minutes. Rinse thoroughly with water and dry well with a towel.  Perform this wash unless told not to do so by your physician.  Enclosed: 1 Ice pack (please put in freezer the night before surgery)   1 Hibiclens skin cleaner     Pre-op instructions  If you have any questions regarding the instructions, please do not hesitate to call our office.  [place]: [address], [place], [place] [address] -- [phone]  [place]: [address] [place], [place] [address] -- [phone]  [place]: [address], [place], [place] [address] -- [phone]   Website: [email or web address] 

## 2019-07-03 ENCOUNTER — Other Ambulatory Visit: Payer: Self-pay | Admitting: Podiatry

## 2019-07-03 DIAGNOSIS — M205X1 Other deformities of toe(s) (acquired), right foot: Secondary | ICD-10-CM

## 2019-07-06 ENCOUNTER — Telehealth: Payer: Self-pay | Admitting: Podiatry

## 2019-07-06 NOTE — Telephone Encounter (Signed)
DOS: 07/14/2019  SURGICAL PROCEDURE: Calvin [name] Implant [phone])  [employer] Medicare Effective 06/19/2019 -   There is no deductible on this plan. [employer] Of [place]ocket is $5,000 with $25 met and $4,975 remaining. CoInsurance is 100% / 0%.  There may be a $200 facility copay.

## 2019-07-08 ENCOUNTER — Telehealth: Payer: Self-pay | Admitting: Podiatry

## 2019-07-08 NOTE — Telephone Encounter (Signed)
I'm scheduled to have surgery with Dr. Paulla Dolly on 01/26 and I need to change the date. Please give me a call back. Thank you.

## 2019-07-08 NOTE — Telephone Encounter (Signed)
Called pt to reschedule his sx. I offered him the date of 02/02 but pt stated he had a dentist appt he has waited 3 months for. I rescheduled his sx to 02/16. I told him I would notify the sx center of his new sx date. Told pt to call with any questions and/or concerns he may have before sx.

## 2019-07-09 NOTE — Telephone Encounter (Signed)
Pt stated his second COVID vaccine would be the same day as his new sx date. Requested to move his sx out another week or two. Pt is now rescheduled for sx on 03/02.  I spoke with Caren Griffins at [facility] and gave her pt's new sx date and reason for the change again.

## 2019-07-09 NOTE — Telephone Encounter (Signed)
I need to speak to you again about the appointment for my foot operation. Please call me back. Thank you.

## 2019-07-15 ENCOUNTER — Telehealth: Payer: Self-pay | Admitting: Family Medicine

## 2019-07-15 MED ORDER — HYDROCHLOROTHIAZIDE 25 MG PO TABS: ORAL_TABLET | ORAL | 0 refills | Status: DC

## 2019-07-15 MED ORDER — LOSARTAN POTASSIUM 100 MG PO TABS: ORAL_TABLET | ORAL | 0 refills | Status: DC

## 2019-07-15 MED ORDER — AMLODIPINE BESYLATE 5 MG PO TABS: ORAL_TABLET | ORAL | 0 refills | Status: DC

## 2019-07-15 MED ORDER — ATORVASTATIN CALCIUM 20 MG PO TABS: 20.0000 mg | ORAL_TABLET | Freq: Every day | ORAL | 0 refills | Status: DC

## 2019-07-15 MED ORDER — FINASTERIDE 5 MG PO TABS: ORAL_TABLET | ORAL | 0 refills | Status: DC

## 2019-07-15 MED ORDER — TAMSULOSIN HCL 0.4 MG PO CAPS: 0.4000 mg | ORAL_CAPSULE | Freq: Every day | ORAL | 0 refills | Status: DC

## 2019-07-15 NOTE — Telephone Encounter (Signed)
Prescriptions have been sent in. Patient is aware.

## 2019-07-15 NOTE — Telephone Encounter (Signed)
Pt called in and stated that he need refills on hydrochlorothiazide 25 MG, losartan 100 MG, tamsulosin 0.4 MG, finasteride 5 MG, amlodipine 5 MG,  And atorvastatin 20 MG     Pharm: [facility] [address].  Pt stated that he will be out of medication on Sunday 07/19/2019.

## 2019-07-22 DIAGNOSIS — R69 Illness, unspecified: Secondary | ICD-10-CM | POA: Diagnosis not present

## 2019-07-23 ENCOUNTER — Encounter: Payer: Medicare HMO | Admitting: Podiatry

## 2019-07-28 ENCOUNTER — Telehealth: Payer: Self-pay | Admitting: Podiatry

## 2019-07-28 NOTE — Telephone Encounter (Signed)
DOS: 08/18/2019  SURGICAL PROCEDURE: Vilinda Blanks Implant [id number])  [employer] Medicare Effective: 06/19/2019 -  Deductible: $ Out of Pocket: $5,000 with $25 met and $4,975 remains. CoInsurance: 100%  Automated ref# [id number]  Per Marcelline Mates B no prior authorization is required. Call ref# [id number].

## 2019-07-31 ENCOUNTER — Telehealth: Payer: Self-pay | Admitting: Podiatry

## 2019-07-31 NOTE — Telephone Encounter (Signed)
Called pt in regards to his surgery he spoke to [place] about. Pt stated his surgery was scheduled for this coming Tuesday but his second COVID vaccine is that day and he rescheduled his sx to 03/02 which was Dr. Mellody Drown next available date. I told him he was correct and I apologized for the confusion. I told the pt I would call the sx center back and get it straightened out with them.

## 2019-07-31 NOTE — Telephone Encounter (Signed)
Caren Griffins from [facility] asked me to call pt about his sx stating he received a letter and was upset because it said his surgery was on Monday.

## 2019-07-31 NOTE — Telephone Encounter (Signed)
Called and spoke to [place] and let her know the pt had actually moved his sx to 03/02 due to him getting his second COVID vaccine this coming Tuesday. I apologized to her if I had forgotten to give her the new date. She rescheduled pt for his sx to 03/02 on her end.

## 2019-08-03 DIAGNOSIS — C44519 Basal cell carcinoma of skin of other part of trunk: Secondary | ICD-10-CM | POA: Diagnosis not present

## 2019-08-03 DIAGNOSIS — C44311 Basal cell carcinoma of skin of nose: Secondary | ICD-10-CM | POA: Diagnosis not present

## 2019-08-13 ENCOUNTER — Encounter: Payer: Medicare HMO | Admitting: Podiatry

## 2019-08-18 ENCOUNTER — Encounter: Payer: Self-pay | Admitting: Podiatry

## 2019-08-18 DIAGNOSIS — M2021 Hallux rigidus, right foot: Secondary | ICD-10-CM | POA: Diagnosis not present

## 2019-08-18 DIAGNOSIS — I10 Essential (primary) hypertension: Secondary | ICD-10-CM | POA: Diagnosis not present

## 2019-08-19 ENCOUNTER — Telehealth: Payer: Self-pay | Admitting: *Deleted

## 2019-08-19 NOTE — Telephone Encounter (Signed)
Called and spoke with the patient and the patient stated that he was doing fine and there was not any fever or chills and not any nausea, patient stated that he did take one pain pill when he got home from the surgery center and took another pain pill that night on Tuesday but has not had to take any more and I stated that we would see the patient next Monday March the 8 and to call the [place] office if any concerns or questions. Calvin [name]

## 2019-08-20 ENCOUNTER — Encounter: Payer: Medicare HMO | Admitting: Podiatry

## 2019-08-24 ENCOUNTER — Other Ambulatory Visit: Payer: Self-pay

## 2019-08-24 ENCOUNTER — Ambulatory Visit (INDEPENDENT_AMBULATORY_CARE_PROVIDER_SITE_OTHER): Payer: Medicare HMO | Admitting: Podiatry

## 2019-08-24 ENCOUNTER — Ambulatory Visit (INDEPENDENT_AMBULATORY_CARE_PROVIDER_SITE_OTHER): Payer: Medicare HMO

## 2019-08-24 ENCOUNTER — Encounter: Payer: Self-pay | Admitting: Podiatry

## 2019-08-24 VITALS — Temp 97.1°F

## 2019-08-24 DIAGNOSIS — M205X1 Other deformities of toe(s) (acquired), right foot: Secondary | ICD-10-CM | POA: Diagnosis not present

## 2019-08-24 DIAGNOSIS — Z09 Encounter for follow-up examination after completed treatment for conditions other than malignant neoplasm: Secondary | ICD-10-CM | POA: Diagnosis not present

## 2019-08-25 ENCOUNTER — Other Ambulatory Visit: Payer: Self-pay | Admitting: Family Medicine

## 2019-08-27 NOTE — Progress Notes (Signed)
Subjective:   Patient ID: Calvin [name], male   DOB: 74 y.o.   [name]: [id number]   HPI Patient states very pleased and so far doing very well with surgery   ROS      Objective:  Physical Exam  Neurovascular status intact negative Bevelyn Buckles' sign noted wound edges well coapted hallux in rectus position     Assessment:  Doing well post implant arthroplasty right     Plan:  H&P x-ray reviewed recommended range of motion exercises reapplied sterile dressing and discussed continued elevation compression immobilization and reappoint several weeks or earlier if needed  X-rays indicate implants in good alignment good positional component noted

## 2019-09-10 ENCOUNTER — Encounter: Payer: Medicare HMO | Admitting: Podiatry

## 2019-09-17 ENCOUNTER — Ambulatory Visit (INDEPENDENT_AMBULATORY_CARE_PROVIDER_SITE_OTHER): Payer: Medicare HMO | Admitting: Podiatry

## 2019-09-17 ENCOUNTER — Other Ambulatory Visit: Payer: Self-pay

## 2019-09-17 ENCOUNTER — Encounter: Payer: Self-pay | Admitting: Podiatry

## 2019-09-17 ENCOUNTER — Ambulatory Visit (INDEPENDENT_AMBULATORY_CARE_PROVIDER_SITE_OTHER): Payer: Medicare HMO

## 2019-09-17 VITALS — Temp 96.3°F

## 2019-09-17 DIAGNOSIS — M205X1 Other deformities of toe(s) (acquired), right foot: Secondary | ICD-10-CM | POA: Diagnosis not present

## 2019-09-17 NOTE — Progress Notes (Signed)
Subjective:   Patient ID: Calvin [name], male   DOB: 74 y.o.   [name]: [id number]   HPI Patient states he is doing well with his right foot and very pleased with no discomfort currently   ROS      Objective:  Physical Exam  Neurovascular status intact with patient's right first MPJ doing well wound edges well coapted good range of motion no crepitus of the joint     Assessment:  Doing well post implant arthroplasty right     Plan:  H&P conditions reviewed recommended continued range of motion exercises supportive therapy and patient will be seen back for [place] to recheck again had as needed this should be uneventful and dispensed ankle compression stocking and begin gradual shoe gear usage  X-rays indicated implant is in place good alignment excellent function of the joint

## 2019-09-21 DIAGNOSIS — L821 Other seborrheic keratosis: Secondary | ICD-10-CM | POA: Diagnosis not present

## 2019-10-06 ENCOUNTER — Other Ambulatory Visit: Payer: Self-pay | Admitting: Family Medicine

## 2019-10-12 DIAGNOSIS — Z6830 Body mass index (BMI) 30.0-30.9, adult: Secondary | ICD-10-CM | POA: Diagnosis not present

## 2019-10-12 DIAGNOSIS — N400 Enlarged prostate without lower urinary tract symptoms: Secondary | ICD-10-CM | POA: Diagnosis not present

## 2019-10-12 DIAGNOSIS — E785 Hyperlipidemia, unspecified: Secondary | ICD-10-CM | POA: Diagnosis not present

## 2019-10-12 DIAGNOSIS — M1990 Unspecified osteoarthritis, unspecified site: Secondary | ICD-10-CM | POA: Diagnosis not present

## 2019-10-12 DIAGNOSIS — Z823 Family history of stroke: Secondary | ICD-10-CM | POA: Diagnosis not present

## 2019-10-12 DIAGNOSIS — G8929 Other chronic pain: Secondary | ICD-10-CM | POA: Diagnosis not present

## 2019-10-12 DIAGNOSIS — E669 Obesity, unspecified: Secondary | ICD-10-CM | POA: Diagnosis not present

## 2019-10-12 DIAGNOSIS — I10 Essential (primary) hypertension: Secondary | ICD-10-CM | POA: Diagnosis not present

## 2019-10-12 DIAGNOSIS — Z8249 Family history of ischemic heart disease and other diseases of the circulatory system: Secondary | ICD-10-CM | POA: Diagnosis not present

## 2019-10-12 DIAGNOSIS — K219 Gastro-esophageal reflux disease without esophagitis: Secondary | ICD-10-CM | POA: Diagnosis not present

## 2019-10-12 DIAGNOSIS — Z008 Encounter for other general examination: Secondary | ICD-10-CM | POA: Diagnosis not present

## 2019-10-20 ENCOUNTER — Telehealth: Payer: Self-pay | Admitting: Family Medicine

## 2019-10-20 NOTE — Chronic Care Management (AMB) (Signed)
°  Chronic Care Management   Note  10/20/2019 Name: CONELL NARDOLILLO [name]: [id number] DOB: 03/05/1946  ASLAN BURDITT is a 74 y.o. year old male who is a primary care patient of Laurey Morale, MD. I reached out to [name] by phone today in response to a referral sent by Mr. Normand Doll Adee's PCP, Laurey Morale, MD.   Mr. Gipp was given information about Chronic Care Management services today including:  1. CCM service includes personalized support from designated clinical staff supervised by his physician, including individualized plan of care and coordination with other care providers 2. 24/7 contact phone numbers for assistance for urgent and routine care needs. 3. Service will only be billed when office clinical staff spend 20 minutes or more in a month to coordinate care. 4. Only one practitioner may furnish and bill the service in a calendar month. 5. The patient may stop CCM services at any time (effective at the end of the month) by phone call to the office staff.   Patient agreed to services and verbal consent obtained.   Follow up plan:   [place]

## 2019-11-02 ENCOUNTER — Other Ambulatory Visit: Payer: Self-pay

## 2019-11-02 ENCOUNTER — Ambulatory Visit (INDEPENDENT_AMBULATORY_CARE_PROVIDER_SITE_OTHER): Payer: Medicare HMO | Admitting: Family Medicine

## 2019-11-02 ENCOUNTER — Encounter: Payer: Self-pay | Admitting: Family Medicine

## 2019-11-02 DIAGNOSIS — M1712 Unilateral primary osteoarthritis, left knee: Secondary | ICD-10-CM | POA: Diagnosis not present

## 2019-11-02 NOTE — Assessment & Plan Note (Signed)
Chronic problem with exacerbation.  Discussed the Voltaren gel again. Injection given today did help.  We will get patient approved for the viscosupplementation.  Discussed icing regimen.  Discussed bracing.  Of which lower impact exercise will be beneficial.  Follow-up again in 4 to 8 weeks

## 2019-11-02 NOTE — Progress Notes (Signed)
[place] [address] [place] Phone: [phone] Subjective:   I Calvin [name] am serving as a Education administrator for Dr. Hulan Saas.  This visit occurred during the SARS-CoV-2 public health emergency.  Safety protocols were in place, including screening questions prior to the visit, additional usage of staff PPE, and extensive cleaning of exam room while observing appropriate contact time as indicated for disinfecting solutions.   I'm seeing this patient by the request  of:  Laurey Morale, MD  CC: Left knee pain  [id number]   03/31/2019 Viscosupplementation given today, tolerated the procedure well, patient will be leaving the state for the next 3 months.  Patient was to increase activity as tolerated.  Follow-up with me again in 6 months.  Continue all other conservative therapy.  11/02/2019 Calvin [name] is a 74 y.o. male coming in with complaint of left knee pain. Patient would like another injection. Visco lasted thus far.  Patient had responded well but now recently started to increase in pain again.  Patient starting to have difficulty with even walking greater than 200 feet.  Increasing instability of the knee      Past Medical History:  Diagnosis Date  . Benign prostatic hypertrophy   . ED (erectile dysfunction)   . Headache(784.0)   . Hypertension   . Hypogonadism male   . Nephrolithiasis    hx of had lithotripsy in 1-10.   Past Surgical History:  Procedure Laterality Date  . colonoscopy  03-28-10   per Dr. Fuller Plan, hyperplastic polyps, repeat in 10 yrs   . LAPAROSCOPIC APPENDECTOMY  03/12/2012   Procedure: APPENDECTOMY LAPAROSCOPIC;  Surgeon: Harl Bowie, MD;  Location: [place];  Service: General;  Laterality: N/A;  . Levert Feinstein HEMORRHOIDECTOMY  04-14-10   per Dr. Johney Maine   . TONSILLECTOMY     Social History   Socioeconomic History  . Marital status: Widowed    Spouse name: Not on file  . Number of children: Not  on file  . Years of education: Not on file  . Highest education level: Not on file  Occupational History  . Not on file  Tobacco Use  . Smoking status: Never Smoker  . Smokeless tobacco: Never Used  Substance and Sexual Activity  . Alcohol use: Yes    Alcohol/week: 0.0 standard drinks    Comment: "to much" drinks vodka   . Drug use: No  . Sexual activity: Not on file  Other Topics Concern  . Not on file  Social History Narrative  . Not on file   Social Determinants of Health   Financial Resource Strain:   . Difficulty of Paying Living Expenses:   Food Insecurity:   . Worried About Charity fundraiser in the Last Year:   . Arboriculturist in the Last Year:   Transportation Needs:   . Film/video editor (Medical):   Marland Kitchen Lack of Transportation (Non-Medical):   Physical Activity:   . Days of Exercise per Week:   . Minutes of Exercise per Session:   Stress:   . Feeling of Stress :   Social Connections:   . Frequency of Communication with Friends and Family:   . Frequency of Social Gatherings with Friends and Family:   . Attends Religious Services:   . Active Member of Clubs or Organizations:   . Attends Archivist Meetings:   Marland Kitchen Marital Status:    No Known Allergies Family History  Problem Relation Age of Onset  . Hypertension Other   . Stroke Other   . Heart disease Other        rheumatic   . Coronary artery disease Other      Current Outpatient Medications (Cardiovascular):  .  amLODipine (NORVASC) 5 MG tablet, TAKE 1 TABLET BY MOUTH EVERY DAY .  atorvastatin (LIPITOR) 20 MG tablet, Take 1 tablet (20 mg total) by mouth daily. .  hydrochlorothiazide (HYDRODIURIL) 25 MG tablet, TAKE 1 TABLET BY MOUTH DAILY .  losartan (COZAAR) 100 MG tablet, TAKE 1 TABLET(100 MG) BY MOUTH DAILY   Current Outpatient Medications (Analgesics):  .  HYDROcodone-acetaminophen (NORCO) 10-325 MG tablet, TK 1 T PO Q 6 H PRN P .  oxyCODONE-acetaminophen (PERCOCET) 10-325 MG  tablet,    Current Outpatient Medications (Other):  Marland Kitchen  Coenzyme Q10 (CO Q-10) 100 MG CAPS, Take by mouth daily. .  diclofenac sodium (VOLTAREN) 1 % GEL, Apply topically to affected area qid .  doxycycline (MONODOX) 50 MG capsule, Take 50 mg by mouth daily.  .  finasteride (PROSCAR) 5 MG tablet, TAKE 1 TABLET BY MOUTH EVERY DAY .  ondansetron (ZOFRAN) 4 MG tablet,  .  tamsulosin (FLOMAX) 0.4 MG CAPS capsule, Take 1 capsule (0.4 mg total) by mouth daily.   Reviewed prior external information including notes and imaging from  primary care provider As well as notes that were available from care everywhere and other healthcare systems.  Past medical history, social, surgical and family history all reviewed in electronic medical record.  No pertanent information unless stated regarding to the chief complaint.   Review of Systems:  No headache, visual changes, nausea, vomiting, diarrhea, constipation, dizziness, abdominal pain, skin rash, fevers, chills, night sweats, weight loss, swollen lymph nodes, body aches, joint swelling, chest pain, shortness of breath, mood changes. POSITIVE muscle aches  Objective  Blood pressure 118/60, pulse 85, height 5\' 11"  (1.803 m), weight 240 lb (108.9 kg), SpO2 97 %.   General: No apparent distress alert and oriented x3 mood and affect normal, dressed appropriately.  Patient is put on some weight since we have seen him HEENT: Pupils equal, extraocular movements intact  Respiratory: Patient's speak in full sentences and does not appear short of breath  Cardiovascular: No lower extremity edema, non tender, no erythema  Neuro: Cranial nerves II through XII are intact, neurovascularly intact in all extremities with 2+ DTRs and 2+ pulses.  Gait mild antalgic favoring the left knee. Knee: Left valgus deformity noted.  Abnormal thigh to calf ratio.  Tender to palpation over medial and PF joint line.  ROM full in flexion and extension and lower leg  rotation. instability with valgus force.  painful patellar compression. Patellar glide with moderate crepitus. Patellar and quadriceps tendons unremarkable. Hamstring and quadriceps strength is normal.  After informed written and verbal consent, patient was seated on exam table. Left knee was prepped with alcohol swab and utilizing anterolateral approach, patient's left knee space was injected with 4:1  marcaine 0.5%: Kenalog 40mg /dL. Patient tolerated the procedure well without immediate complications.   Impression and Recommendations:     This case required medical decision making of moderate complexity. The above documentation has been reviewed and is accurate and complete Lyndal Pulley, DO       Note: This dictation was prepared with Dragon dictation along with smaller phrase technology. Any transcriptional errors that result from this process are unintentional.

## 2019-11-02 NOTE — Patient Instructions (Signed)
Good to see you See me again in 4 weeks we will get approval

## 2019-11-06 NOTE — Chronic Care Management (AMB) (Deleted)
Chronic Care Management Pharmacy  Name: Calvin [name]  [name]: [id number] DOB: 11/28/1945  Initial Planning Appointment: ***  Initial Questions: 1. Have you seen any other providers since your last visit? {YES/NO:21197} 2. Any changes in your medicines or health? {YES/NO:21197}  Chief Complaint/ HPI  Calvin [name],  74 y.o. , male presents for their {Initial/Follow-up:3041532} CCM visit with the clinical pharmacist {CHL HP Upstream Pharm visit [id number].  PCP : Laurey Morale, MD  Their chronic conditions include: {CHL AMB CHRONIC MEDICAL [phone]}  *** 5/4  Office Visits: 01/02/19 OV - annual exam. Patient doing well over. No changes with meds.  Consult Visit: 11/02/19 OV Tamala Julian, Sports [facility]) - primary osteoarthritis of left knee. Received shot today. Discussed on using Voltaren gel. Pending approval for viscosupplmentation. No chanes with other meds.  Medications: Outpatient Encounter Medications as of 11/11/2019  Medication Sig  . amLODipine (NORVASC) 5 MG tablet TAKE 1 TABLET BY MOUTH EVERY DAY  . atorvastatin (LIPITOR) 20 MG tablet Take 1 tablet (20 mg total) by mouth daily.  . Coenzyme Q10 (CO Q-10) 100 MG CAPS Take by mouth daily.  . diclofenac sodium (VOLTAREN) 1 % GEL Apply topically to affected area qid  . doxycycline (MONODOX) 50 MG capsule Take 50 mg by mouth daily.   . finasteride (PROSCAR) 5 MG tablet TAKE 1 TABLET BY MOUTH EVERY DAY  . hydrochlorothiazide (HYDRODIURIL) 25 MG tablet TAKE 1 TABLET BY MOUTH DAILY  . HYDROcodone-acetaminophen (NORCO) 10-325 MG tablet TK 1 T PO Q 6 H PRN P  . losartan (COZAAR) 100 MG tablet TAKE 1 TABLET(100 MG) BY MOUTH DAILY  . ondansetron (ZOFRAN) 4 MG tablet   . oxyCODONE-acetaminophen (PERCOCET) 10-325 MG tablet   . tamsulosin (FLOMAX) 0.4 MG CAPS capsule Take 1 capsule (0.4 mg total) by mouth daily.   No facility-administered encounter medications on file as of 11/11/2019.     Current  Diagnosis/Assessment:  Goals Addressed   None     {CHL HP [facility] Pharmacy Diagnosis/Assessment:[phone]}  Hypertension   BP today is:  {CHL HP UPSTREAM Pharmacist BP ranges:[phone]}  Office blood pressures are  BP Readings from Last 3 Encounters:  11/02/19 118/60  03/31/19 112/68  03/02/19 117/80    Patient has failed these meds in the past: *** Patient is currently {CHL Controlled/Uncontrolled:[phone]} on the following medications:  . Amlodipine 5 mg 1 tablet once daily . Losartan 100 mg 1 tablet once daily . HCTZ 25 mg 1 tablet once daily   Patient checks BP at home {CHL HP BP Monitoring Frequency:[phone]}  Patient home BP readings are ranging: ***  We discussed {CHL HP [facility] Pharmacy discussion:[phone]}  Plan  Continue {CHL HP Upstream Pharmacy Plans:[phone]}   Hyperlipidemia   Lipid Panel     Component Value Date/Time   CHOL 182 01/02/2019 1232   TRIG 105.0 01/02/2019 1232   HDL 43.40 01/02/2019 1232   CHOLHDL 4 01/02/2019 1232   VLDL 21.0 01/02/2019 1232   LDLCALC 118 (H) 01/02/2019 1232   LDLDIRECT 157.1 02/16/2011 0849     The 10-year ASCVD risk score Mikey Bussing DC Jr., et al., 2013) is: 21.2%   Values used to calculate the score:     Age: 74 years     Sex: Male     Is Non-Hispanic African American: No     Diabetic: No     Tobacco smoker: No     Systolic Blood Pressure: 123456 mmHg     Is BP treated: Yes  HDL Cholesterol: 43.4 mg/dL     Total Cholesterol: 182 mg/dL   Patient has failed these meds in past: *** Patient is currently {CHL Controlled/[phone]} on the following medications:  . Atorvastatin 20 mg 1 tablet once daily  We discussed:  {CHL HP Upstream Pharmacy discussion:[phone]}  Plan  Continue {CHL HP Upstream Pharmacy Plans:[phone]}  BPH   Patient has failed these meds in past: *** Patient is currently {CHL Controlled/[phone]} on the following medications:  . Finasteride  5 mg 1 tablet once daily . Tamsulosin 0.4 mg 1 capsule once daily  We discussed:  ***  Plan  Continue {CHL HP Upstream Pharmacy Plans:[phone]}   Osteoarthritis   Patient has failed these meds in past: *** Patient is currently {CHL Controlled/[phone]} on the following medications:  . Oxycodone/APAP 10/325 mg 1 tablet  . Hydrocodone/APAP 10/325 mg 1 tablet every 6 hours PRN . Voltaren 1% gel to affected area 4 times a day  We discussed:  ***  Plan  Continue {CHL HP [facility]stream Pharmacy GL:[id number]   OTCs/Health Maintenance   Patient is currently {CHL Controlled/[phone]} on the following medications: . Doxycycline 50 mg 1 capsule once daily . Coenzyme Q10 100 mg 1 capsule once daily . Ondansetron 4 mg *** no sig  We discussed:  ***  Plan  Continue {CHL HP [facility]stream Pharmacy GL:[id number]    Vaccines   Reviewed and discussed patient's vaccination history.    Immunization History  Administered Date(s) Administered  . Influenza Split 02/28/2011, 06/15/2012  . Influenza Whole 03/27/2009, 07/19/2010  . Influenza, High Dose Seasonal PF 07/20/2014, 02/24/2015, 04/04/2017, 03/27/2018, 02/20/2019  . Influenza,inj,Quad PF,6+ Mos 06/26/2013  . Pneumococcal Conjugate-13 01/09/2017  . Pneumococcal Polysaccharide-23 03/27/2018  . Tdap 09/22/2015  . Zoster Recombinat (Shingrix) 02/20/2019    Plan  Recommended patient receive *** vaccine in *** office/pharmacy.    Medication Management   Pharmacy/Benefits: [facility] / [place] Adherence:  Pt endorses ***% compliance  We discussed: ***  Plan  {US Pharmacy [id number]   Follow up: *** month phone visit  Geraldine Contras, PharmD Clinical Pharmacist [facility] [place] [phone]

## 2019-11-11 ENCOUNTER — Ambulatory Visit: Payer: Medicare HMO

## 2019-11-19 DIAGNOSIS — R69 Illness, unspecified: Secondary | ICD-10-CM | POA: Diagnosis not present

## 2019-11-30 ENCOUNTER — Other Ambulatory Visit: Payer: Self-pay

## 2019-11-30 ENCOUNTER — Encounter: Payer: Self-pay | Admitting: Family Medicine

## 2019-11-30 ENCOUNTER — Ambulatory Visit (INDEPENDENT_AMBULATORY_CARE_PROVIDER_SITE_OTHER): Payer: Medicare HMO | Admitting: Family Medicine

## 2019-11-30 DIAGNOSIS — M1712 Unilateral primary osteoarthritis, left knee: Secondary | ICD-10-CM | POA: Diagnosis not present

## 2019-11-30 NOTE — Patient Instructions (Signed)
Gel injection today See me again in 6 weeks

## 2019-11-30 NOTE — Progress Notes (Signed)
[place] Calvin [name] Phone: [phone] Subjective:   Calvin [name], am serving as a scribe for Dr. Hulan Saas. This visit occurred during the SARS-CoV-2 public health emergency.  Safety protocols were in place, including screening questions prior to the visit, additional usage of staff PPE, and extensive cleaning of exam room while observing appropriate contact time as indicated for disinfecting solutions.   I'm seeing this patient by the request  of:  Laurey Morale, MD  CC: Knee pain follow-up  PNT:[id number]  Calvin [name] is a 74 y.o. male coming in with complaint of left knee pain. Patient approved for Synvisc One.  Severe overall.  Not responding.  Patient feels like the steroid injection was not helpful.  Did do have to do a lot of walking.     Past Medical History:  Diagnosis Date  . Benign prostatic hypertrophy   . ED (erectile dysfunction)   . Headache(784.0)   . Hypertension   . Hypogonadism male   . Nephrolithiasis    hx of had lithotripsy in 1-10.   Past Surgical History:  Procedure Laterality Date  . colonoscopy  03-28-10   per Dr. Fuller Plan, hyperplastic polyps, repeat in 10 yrs   . LAPAROSCOPIC APPENDECTOMY  03/12/2012   Procedure: APPENDECTOMY LAPAROSCOPIC;  Surgeon: Harl Bowie, MD;  Location: [place];  Service: General;  Laterality: N/A;  . Levert Feinstein HEMORRHOIDECTOMY  04-14-10   per Dr. Johney Maine   . TONSILLECTOMY     Social History   Socioeconomic History  . Marital status: Widowed    Spouse name: Not on file  . Number of children: Not on file  . Years of education: Not on file  . Highest education level: Not on file  Occupational History  . Not on file  Tobacco Use  . Smoking status: Never Smoker  . Smokeless tobacco: Never Used  Substance and Sexual Activity  . Alcohol use: Yes    Alcohol/week: 0.0 standard drinks    Comment: "to much" drinks vodka   . Drug use: [name]  . Sexual  activity: Not on file  Other Topics Concern  . Not on file  Social History Narrative  . Not on file   Social Determinants of Health   Financial Resource Strain:   . Difficulty of Paying Living Expenses:   Food Insecurity:   . Worried About Charity fundraiser in the Last Year:   . Arboriculturist in the Last Year:   Transportation Needs:   . Film/video editor (Medical):   Marland Kitchen Lack of Transportation (Non-Medical):   Physical Activity:   . Days of Exercise per Week:   . Minutes of Exercise per Session:   Stress:   . Feeling of Stress :   Social Connections:   . Frequency of Communication with Friends and Family:   . Frequency of Social Gatherings with Friends and Family:   . Attends Religious Services:   . Active Member of Clubs or Organizations:   . Attends Archivist Meetings:   Marland Kitchen Marital Status:    [name] Known Allergies Family History  Problem Relation Age of Onset  . Hypertension Other   . Stroke Other   . Heart disease Other        rheumatic   . Coronary artery disease Other      Current Outpatient Medications (Cardiovascular):  .  amLODipine (NORVASC) 5 MG tablet, TAKE 1 TABLET BY MOUTH  EVERY DAY .  atorvastatin (LIPITOR) 20 MG tablet, Take 1 tablet (20 mg total) by mouth daily. .  hydrochlorothiazide (HYDRODIURIL) 25 MG tablet, TAKE 1 TABLET BY MOUTH DAILY .  losartan (COZAAR) 100 MG tablet, TAKE 1 TABLET(100 MG) BY MOUTH DAILY   Current Outpatient Medications (Analgesics):  .  HYDROcodone-acetaminophen (NORCO) 10-325 MG tablet, TK 1 T PO Q 6 H PRN P .  oxyCODONE-acetaminophen (PERCOCET) 10-325 MG tablet,    Current Outpatient Medications (Other):  Marland Kitchen  Coenzyme Q10 (CO Q-10) 100 MG CAPS, Take by mouth daily. .  diclofenac sodium (VOLTAREN) 1 % GEL, Apply topically to affected area qid .  doxycycline (MONODOX) 50 MG capsule, Take 50 mg by mouth daily.  .  finasteride (PROSCAR) 5 MG tablet, TAKE 1 TABLET BY MOUTH EVERY DAY .  ondansetron (ZOFRAN) 4  MG tablet,  .  tamsulosin (FLOMAX) 0.4 MG CAPS capsule, Take 1 capsule (0.4 mg total) by mouth daily.   Reviewed prior external information including notes and imaging from  primary care provider As well as notes that were available from care everywhere and other healthcare systems.  Past medical history, social, surgical and family history all reviewed in electronic medical record.  [name] pertanent information unless stated regarding to the chief complaint.   Review of Systems:  [name] headache, visual changes, nausea, vomiting, diarrhea, constipation, dizziness, abdominal pain, skin rash, fevers, chills, night sweats, weight loss, swollen lymph nodes, body aches, joint swelling, chest pain, shortness of breath, mood changes. POSITIVE muscle aches  Objective  Blood pressure 104/66, pulse (!) 101, height 5\' 11"  (1.803 m), weight 238 lb (108 kg), SpO2 98 %.   General: [name] apparent distress alert and oriented x3 mood and affect normal, dressed appropriately.  HEENT: Pupils equal, extraocular movements intact  Respiratory: Patient's speak in full sentences and does not appear short of breath  Cardiovascular: [name] lower extremity edema, non tender, [name] erythema  Neuro: Cranial nerves II through XII are intact, neurovascularly intact in all extremities with 2+ DTRs and 2+ pulses.  Gait antalgic Knee: Left valgus deformity noted.  Abnormal thigh to calf ratio.  Tender to palpation over medial and PF joint line.  ROM full in flexion and extension and lower leg rotation. instability with valgus force.  painful patellar compression. Patellar glide with moderate crepitus. Patellar and quadriceps tendons unremarkable. Hamstring and quadriceps strength is normal.  after informed written and verbal consent, patient was seated on exam table. Left knee was prepped with alcohol swab and utilizing anterolateral approach, patient's left knee space was injected with with 40 mg/mL of Synvisc 1 (sodium hyaluronate)  in a prefilled syringe was injected easily into the knee through a 22-gauge needle..Patient tolerated the procedure well without immediate complications.  Contralateral knee shows  Impression and Recommendations:     The above documentation has been reviewed and is accurate and complete Lyndal Pulley, DO       Note: This dictation was prepared with Dragon dictation along with smaller phrase technology. Any transcriptional errors that result from this process are unintentional.

## 2019-11-30 NOTE — Assessment & Plan Note (Signed)
Viscosupplementation given again today.  Chronic problem with exacerbation.  Encourage patient to continue the other medications.  Discussed pertinent prescription medications including patient's pain medications from another provider.  Icing regimen.  Discussed topical anti-inflammatories.  Follow-up again in 4 weeks

## 2019-12-15 DIAGNOSIS — M5412 Radiculopathy, cervical region: Secondary | ICD-10-CM | POA: Diagnosis not present

## 2019-12-15 DIAGNOSIS — M9901 Segmental and somatic dysfunction of cervical region: Secondary | ICD-10-CM | POA: Diagnosis not present

## 2019-12-15 DIAGNOSIS — M542 Cervicalgia: Secondary | ICD-10-CM | POA: Diagnosis not present

## 2019-12-15 DIAGNOSIS — M9902 Segmental and somatic dysfunction of thoracic region: Secondary | ICD-10-CM | POA: Diagnosis not present

## 2019-12-16 DIAGNOSIS — M5412 Radiculopathy, cervical region: Secondary | ICD-10-CM | POA: Diagnosis not present

## 2019-12-16 DIAGNOSIS — M9902 Segmental and somatic dysfunction of thoracic region: Secondary | ICD-10-CM | POA: Diagnosis not present

## 2019-12-16 DIAGNOSIS — M542 Cervicalgia: Secondary | ICD-10-CM | POA: Diagnosis not present

## 2019-12-16 DIAGNOSIS — M9901 Segmental and somatic dysfunction of cervical region: Secondary | ICD-10-CM | POA: Diagnosis not present

## 2019-12-17 DIAGNOSIS — M542 Cervicalgia: Secondary | ICD-10-CM | POA: Diagnosis not present

## 2019-12-17 DIAGNOSIS — M9901 Segmental and somatic dysfunction of cervical region: Secondary | ICD-10-CM | POA: Diagnosis not present

## 2019-12-17 DIAGNOSIS — M9902 Segmental and somatic dysfunction of thoracic region: Secondary | ICD-10-CM | POA: Diagnosis not present

## 2019-12-17 DIAGNOSIS — M5412 Radiculopathy, cervical region: Secondary | ICD-10-CM | POA: Diagnosis not present

## 2019-12-21 DIAGNOSIS — M9902 Segmental and somatic dysfunction of thoracic region: Secondary | ICD-10-CM | POA: Diagnosis not present

## 2019-12-21 DIAGNOSIS — M9901 Segmental and somatic dysfunction of cervical region: Secondary | ICD-10-CM | POA: Diagnosis not present

## 2019-12-21 DIAGNOSIS — M542 Cervicalgia: Secondary | ICD-10-CM | POA: Diagnosis not present

## 2019-12-21 DIAGNOSIS — M5412 Radiculopathy, cervical region: Secondary | ICD-10-CM | POA: Diagnosis not present

## 2019-12-22 DIAGNOSIS — M5412 Radiculopathy, cervical region: Secondary | ICD-10-CM | POA: Diagnosis not present

## 2019-12-22 DIAGNOSIS — M9901 Segmental and somatic dysfunction of cervical region: Secondary | ICD-10-CM | POA: Diagnosis not present

## 2019-12-22 DIAGNOSIS — M542 Cervicalgia: Secondary | ICD-10-CM | POA: Diagnosis not present

## 2019-12-22 DIAGNOSIS — M9902 Segmental and somatic dysfunction of thoracic region: Secondary | ICD-10-CM | POA: Diagnosis not present

## 2019-12-24 DIAGNOSIS — M9901 Segmental and somatic dysfunction of cervical region: Secondary | ICD-10-CM | POA: Diagnosis not present

## 2019-12-24 DIAGNOSIS — M9902 Segmental and somatic dysfunction of thoracic region: Secondary | ICD-10-CM | POA: Diagnosis not present

## 2019-12-24 DIAGNOSIS — M5412 Radiculopathy, cervical region: Secondary | ICD-10-CM | POA: Diagnosis not present

## 2019-12-24 DIAGNOSIS — M542 Cervicalgia: Secondary | ICD-10-CM | POA: Diagnosis not present

## 2019-12-27 ENCOUNTER — Other Ambulatory Visit: Payer: Self-pay | Admitting: Family Medicine

## 2019-12-28 ENCOUNTER — Other Ambulatory Visit: Payer: Self-pay | Admitting: Family Medicine

## 2019-12-28 DIAGNOSIS — M9901 Segmental and somatic dysfunction of cervical region: Secondary | ICD-10-CM | POA: Diagnosis not present

## 2019-12-28 DIAGNOSIS — M542 Cervicalgia: Secondary | ICD-10-CM | POA: Diagnosis not present

## 2019-12-28 DIAGNOSIS — M5412 Radiculopathy, cervical region: Secondary | ICD-10-CM | POA: Diagnosis not present

## 2019-12-28 DIAGNOSIS — M9902 Segmental and somatic dysfunction of thoracic region: Secondary | ICD-10-CM | POA: Diagnosis not present

## 2019-12-29 DIAGNOSIS — M9901 Segmental and somatic dysfunction of cervical region: Secondary | ICD-10-CM | POA: Diagnosis not present

## 2019-12-29 DIAGNOSIS — M5412 Radiculopathy, cervical region: Secondary | ICD-10-CM | POA: Diagnosis not present

## 2019-12-29 DIAGNOSIS — M542 Cervicalgia: Secondary | ICD-10-CM | POA: Diagnosis not present

## 2019-12-29 DIAGNOSIS — M9902 Segmental and somatic dysfunction of thoracic region: Secondary | ICD-10-CM | POA: Diagnosis not present

## 2019-12-31 DIAGNOSIS — M9901 Segmental and somatic dysfunction of cervical region: Secondary | ICD-10-CM | POA: Diagnosis not present

## 2019-12-31 DIAGNOSIS — M542 Cervicalgia: Secondary | ICD-10-CM | POA: Diagnosis not present

## 2019-12-31 DIAGNOSIS — M5412 Radiculopathy, cervical region: Secondary | ICD-10-CM | POA: Diagnosis not present

## 2019-12-31 DIAGNOSIS — M9902 Segmental and somatic dysfunction of thoracic region: Secondary | ICD-10-CM | POA: Diagnosis not present

## 2020-01-05 ENCOUNTER — Ambulatory Visit (INDEPENDENT_AMBULATORY_CARE_PROVIDER_SITE_OTHER): Payer: Medicare HMO | Admitting: Family Medicine

## 2020-01-05 ENCOUNTER — Other Ambulatory Visit: Payer: Self-pay

## 2020-01-05 ENCOUNTER — Ambulatory Visit (INDEPENDENT_AMBULATORY_CARE_PROVIDER_SITE_OTHER)
Admission: RE | Admit: 2020-01-05 | Discharge: 2020-01-05 | Disposition: A | Discharge status: Home / Self Care | Payer: Medicare HMO | Source: Ambulatory Visit | Attending: Family Medicine | Admitting: Family Medicine

## 2020-01-05 ENCOUNTER — Encounter: Payer: Self-pay | Admitting: Family Medicine

## 2020-01-05 VITALS — BP 124/70 | HR 82 | Temp 98.0°F | Ht 71.0 in | Wt 239.8 lb

## 2020-01-05 DIAGNOSIS — M545 Low back pain, unspecified: Secondary | ICD-10-CM

## 2020-01-05 DIAGNOSIS — M25551 Pain in right hip: Secondary | ICD-10-CM

## 2020-01-05 DIAGNOSIS — M25552 Pain in left hip: Secondary | ICD-10-CM | POA: Diagnosis not present

## 2020-01-05 DIAGNOSIS — Z0000 Encounter for general adult medical examination without abnormal findings: Secondary | ICD-10-CM

## 2020-01-05 MED ORDER — SILDENAFIL CITRATE 100 MG PO TABS
100.0000 mg | ORAL_TABLET | Freq: Every day | ORAL | 11 refills | Status: DC | PRN
Start: 2020-01-05 — End: 2021-04-25

## 2020-01-05 NOTE — Progress Notes (Signed)
Subjective:    Patient ID: Calvin [name], male    DOB: 1946/02/25, 74 y.o.   [name]: [id number]  HPI Here for a well exam. He is doing well but he asks about bilateral hip pain and lo back pain that started about a year ago. It is annoying but not severe. No hx of trauma. No radiation of pain to the legs. Ibuprofen helps a little. He will be due for another colonoscopy in October.    Review of Systems  Constitutional: Negative.   HENT: Negative.   Eyes: Negative.   Respiratory: Negative.   Cardiovascular: Negative.   Gastrointestinal: Negative.   Genitourinary: Negative.   Musculoskeletal: Positive for arthralgias and back pain.  Skin: Negative.   Neurological: Negative.   Psychiatric/Behavioral: Negative.        Objective:   Physical Exam Constitutional:      General: He is not in acute distress.    Appearance: He is well-developed. He is not diaphoretic.  HENT:     Head: Normocephalic and atraumatic.     Right Ear: External ear normal.     Left Ear: External ear normal.     Nose: Nose normal.     Mouth/Throat:     Pharynx: No oropharyngeal exudate.  Eyes:     General: No scleral icterus.       Right eye: No discharge.        Left eye: No discharge.     Conjunctiva/sclera: Conjunctivae normal.     Pupils: Pupils are equal, round, and reactive to light.  Neck:     Thyroid: No thyromegaly.     Vascular: No JVD.     Trachea: No tracheal deviation.  Cardiovascular:     Rate and Rhythm: Normal rate and regular rhythm.     Heart sounds: Normal heart sounds. No murmur heard.  No friction rub. No gallop.   Pulmonary:     Effort: Pulmonary effort is normal. No respiratory distress.     Breath sounds: Normal breath sounds. No wheezing or rales.  Chest:     Chest wall: No tenderness.  Abdominal:     General: Bowel sounds are normal. There is no distension.     Palpations: Abdomen is soft. There is no mass.     Tenderness: There is no abdominal tenderness. There is no  guarding or rebound.  Genitourinary:    Penis: Normal. No tenderness.      Testes: Normal.     Prostate: Normal.     Rectum: Normal. Guaiac result negative.  Musculoskeletal:        General: No tenderness. Normal range of motion.     Cervical back: Neck supple.  Lymphadenopathy:     Cervical: No cervical adenopathy.  Skin:    General: Skin is warm and dry.     Coloration: Skin is not pale.     Findings: No erythema or rash.  Neurological:     Mental Status: He is alert and oriented to person, place, and time.     Cranial Nerves: No cranial nerve deficit.     Motor: No abnormal muscle tone.     Coordination: Coordination normal.     Deep Tendon Reflexes: Reflexes are normal and symmetric. Reflexes normal.  Psychiatric:        Behavior: Behavior normal.        Thought Content: Thought content normal.        Judgment: Judgment normal.  Assessment & Plan:  Well exam. We discussed diet and exercise. Get fasting labs. Set up Xrays of the lumbar spine and hips. He can use Ibuprofen as needed for pain.  Alysia Penna, MD

## 2020-01-06 LAB — LIPID PANEL
Cholesterol: 149 mg/dL (ref <200)
HDL: 61 mg/dL (ref >=40)
LDL Cholesterol (Calc): 67 mg/dL (calc)
Non-HDL Cholesterol (Calc): 88 mg/dL (calc) (ref <130)
Total CHOL/HDL Ratio: 2.4 (calc) (ref <5.0)
Triglycerides: 126 mg/dL (ref <150)

## 2020-01-06 LAB — HEPATIC FUNCTION PANEL
AG Ratio: 1.7 (calc) (ref 1.0–2.5)
ALT: 45 U/L (ref 9–46)
AST: 24 U/L (ref 10–35)
Albumin: 4.6 g/dL (ref 3.6–5.1)
Alkaline phosphatase (APISO): 64 U/L (ref 35–144)
Bilirubin, Direct: 0.2 mg/dL (ref 0.0–0.2)
Globulin: 2.7 g/dL (calc) (ref 1.9–3.7)
Indirect Bilirubin: 0.8 mg/dL (calc) (ref 0.2–1.2)
Total Bilirubin: 1 mg/dL (ref 0.2–1.2)
Total Protein: 7.3 g/dL (ref 6.1–8.1)

## 2020-01-06 LAB — BASIC METABOLIC PANEL
BUN: 14 mg/dL (ref 7–25)
CO2: 23 mmol/L (ref 20–32)
Calcium: 9.5 mg/dL (ref 8.6–10.3)
Chloride: 104 mmol/L (ref 98–110)
Creat: 0.84 mg/dL (ref 0.70–1.18)
Glucose, Bld: 99 mg/dL (ref 65–99)
Potassium: 3.7 mmol/L (ref 3.5–5.3)
Sodium: 140 mmol/L (ref 135–146)

## 2020-01-06 LAB — CBC WITH DIFFERENTIAL/PLATELET
Absolute Monocytes: 631 cells/uL (ref 200–950)
Basophils Absolute: 47 cells/uL (ref 0–200)
Basophils Relative: 0.8 %
Eosinophils Absolute: 130 cells/uL (ref 15–500)
Eosinophils Relative: 2.2 %
HCT: 44.8 % (ref 38.5–50.0)
Hemoglobin: 15.5 g/dL (ref 13.2–17.1)
Lymphs Abs: 1935 cells/uL (ref 850–3900)
MCH: 34.8 pg — ABNORMAL HIGH (ref 27.0–33.0)
MCHC: 34.6 g/dL (ref 32.0–36.0)
MCV: 100.4 fL — ABNORMAL HIGH (ref 80.0–100.0)
MPV: 10 fL (ref 7.5–12.5)
Monocytes Relative: 10.7 %
Neutro Abs: 3157 cells/uL (ref 1500–7800)
Neutrophils Relative %: 53.5 %
Platelets: 246 10*3/uL (ref 140–400)
RBC: 4.46 10*6/uL (ref 4.20–5.80)
RDW: 12.4 % (ref 11.0–15.0)
Total Lymphocyte: 32.8 %
WBC: 5.9 10*3/uL (ref 3.8–10.8)

## 2020-01-06 LAB — PSA: PSA: 0.8 ng/mL (ref <=4.0)

## 2020-01-06 LAB — TSH: TSH: 3.96 mIU/L (ref 0.40–4.50)

## 2020-01-06 LAB — TESTOSTERONE: Testosterone: 577 ng/dL (ref 250–827)

## 2020-01-22 ENCOUNTER — Telehealth: Payer: Self-pay | Admitting: Family Medicine

## 2020-01-22 DIAGNOSIS — M1712 Unilateral primary osteoarthritis, left knee: Secondary | ICD-10-CM

## 2020-01-22 DIAGNOSIS — I10 Essential (primary) hypertension: Secondary | ICD-10-CM

## 2020-01-22 NOTE — Telephone Encounter (Signed)
-----   Message from Germaine Pomfret, [facility] sent at 01/22/2020  9:28 AM EDT ----- Regarding: CCM Pharmacist Referral Dr. Sarajane Jews,  Can you please place a referral to chronic care management for this patient?  Thanks, Doristine Section Clinical Pharmacist [facility]

## 2020-01-27 ENCOUNTER — Other Ambulatory Visit: Payer: Self-pay

## 2020-01-27 ENCOUNTER — Telehealth: Payer: Self-pay

## 2020-01-27 ENCOUNTER — Telehealth: Payer: Self-pay | Admitting: Family Medicine

## 2020-01-27 NOTE — Progress Notes (Signed)
Left Voice message to do initial question prior to patient appointment on 01/28/2020 for CCM at 2:00 P.M with Junius Argyle the Clinical pharmacist.   Anderson Malta Clinical Pharmacist Assistant [phone]

## 2020-01-27 NOTE — Progress Notes (Signed)
Erroneous encounter

## 2020-01-27 NOTE — Telephone Encounter (Signed)
Attempted to reach patient x2 for medicare care wellness telephone visit. After unsuccessful attempts appointment was canceled

## 2020-01-28 ENCOUNTER — Ambulatory Visit: Payer: Medicare HMO

## 2020-01-28 DIAGNOSIS — I10 Essential (primary) hypertension: Secondary | ICD-10-CM

## 2020-01-28 DIAGNOSIS — F320 Major depressive disorder, single episode, mild: Secondary | ICD-10-CM

## 2020-01-28 NOTE — Chronic Care Management (AMB) (Signed)
Chronic Care Management Pharmacy  Name: Calvin [name]  [name]: [id number] DOB: 12-Nov-1945   Chief Complaint/ HPI  Calvin [name],  74 y.o. , male presents for their Initial CCM visit with the clinical pharmacist via telephone.  PCP : Laurey Morale, MD Patient Care Team: Laurey Morale, MD as PCP - General Germaine Pomfret, [facility] as Pharmacist (Pharmacist)  Their chronic conditions include: Hypertension, Hyperlipidemia, Depression, Osteoarthritis, BPH and Insomnia   Office Visits: 01/05/20: Patient presented to Dr. Sarajane Jews for follow-up. Patient started on sildenafil 100 mg daily PRN. Ibuprofen as needed for pain.   Consult Visit: 11/30/19: Patient presented to Dr. Tamala Julian (Sports Medicine) for knee osteoarthritis.   No Known Allergies   Patient has recently returned to the gym three times weekly.   Medications: Outpatient Encounter Medications as of 01/28/2020  Medication Sig  . amLODipine (NORVASC) 5 MG tablet TAKE 1 TABLET BY MOUTH EVERY DAY  . atorvastatin (LIPITOR) 20 MG tablet TAKE 1 TABLET EVERY DAY  . finasteride (PROSCAR) 5 MG tablet TAKE 1 TABLET BY MOUTH EVERY DAY  . glucosamine-chondroitin 500-400 MG tablet Take 1 tablet by mouth daily.  . hydrochlorothiazide (HYDRODIURIL) 25 MG tablet TAKE 1 TABLET BY MOUTH DAILY  . losartan (COZAAR) 100 MG tablet TAKE 1 TABLET BY MOUTH EVERY DAY  . tamsulosin (FLOMAX) 0.4 MG CAPS capsule TAKE 1 CAPSULE BY MOUTH EVERY DAY  . Coenzyme Q10 (CO Q-10) 100 MG CAPS Take by mouth daily.  . diclofenac sodium (VOLTAREN) 1 % GEL Apply topically to affected area qid  . doxycycline (MONODOX) 50 MG capsule Take 50 mg by mouth daily.   Marland Kitchen HYDROcodone-acetaminophen (NORCO) 10-325 MG tablet TK 1 T PO Q 6 H PRN P  . ondansetron (ZOFRAN) 4 MG tablet   . oxyCODONE-acetaminophen (PERCOCET) 10-325 MG tablet   . sildenafil (VIAGRA) 100 MG tablet Take 1 tablet (100 mg total) by mouth daily as needed for erectile dysfunction. (Patient not taking: Reported on  01/28/2020)   No facility-administered encounter medications on file as of 01/28/2020.     Current Diagnosis/Assessment:  SDOH Interventions     Most Recent Value  SDOH Interventions  Financial Strain Interventions Intervention Not Indicated  Transportation Interventions Intervention Not Indicated      Goals Addressed            This Visit's Progress   . Chronic Care Management       CARE PLAN ENTRY (see longitudinal plan of care for additional care plan information)  Current Barriers:  . Chronic Disease Management support, education, and care coordination needs related to Hypertension, Hyperlipidemia, Depression, Osteoarthritis, BPH and Insomnia    Hypertension BP Readings from Last 3 Encounters:  01/05/20 124/70  11/30/19 104/66  11/02/19 118/60   . Pharmacist Clinical Goal(s): o Over the next 90 days, patient will work with PharmD and providers to maintain BP goal <130/80 . Current regimen:  . Amlodipine 5 mg daily  . Hydrochlorothiazide 25 mg daily   . Losartan 100 mg daily  . Interventions: o Discussed low salt diet and exercising as tolerated extensively o Recommend taking Hydrochlorothiazide in the morning . Patient self care activities - Over the next 90 days, patient will: o Check blood pressure 1-2 times weekly, document, and provide at future appointments o Ensure daily salt intake < 2300 mg/day  Hyperlipidemia Lab Results  Component Value Date/Time   LDLCALC 67 01/05/2020 10:47 AM   LDLDIRECT 157.1 02/16/2011 08:49 AM   . Pharmacist Clinical  Goal(s): o Over the next 90 days, patient will work with PharmD and providers to maintain LDL goal < 100 . Current regimen:  o Atorvastatin 20 mg daily  . Interventions: o Discussed low cholesterol diet and exercising as tolerated extensively  Medication management . Pharmacist Clinical Goal(s): o Over the next 90 days, patient will work with PharmD and providers to maintain optimal medication  adherence . [facility] . Interventions o Comprehensive medication review performed. o Continue current medication management strategy . Patient self care activities - Over the next 90 days, patient will: o Take medications as prescribed o Report any questions or concerns to PharmD and/or provider(s)      Hypertension   BP goal is:  <130/80  Office blood pressures are  BP Readings from Last 3 Encounters:  01/05/20 124/70  11/30/19 104/66  11/02/19 118/60   Patient checks BP at home daily Patient home BP readings are ranging: 156/96 (skipped meds), normally 120-130/85  Patient has failed these meds in the past: n/a Patient is currently controlled on the following medications:  . Amlodipine 5 mg daily  . HCTZ 25 mg daily (Midnight)  . Losartan 100 mg daily   We discussed diet and exercise extensively  Plan  Continue current medications  Recommend switching HCTZ to AM administration to minimize nocturia.   Hyperlipidemia    LDL goal < 100  Lipid Panel     Component Value Date/Time   CHOL 149 01/05/2020 1047   TRIG 126 01/05/2020 1047   HDL 61 01/05/2020 1047   LDLCALC 67 01/05/2020 1047   LDLDIRECT 157.1 02/16/2011 0849    Hepatic Function Latest Ref Rng & Units 01/05/2020 01/02/2019 01/13/2018  Total Protein 6.1 - 8.1 g/dL 7.3 7.2 7.1  Albumin 3.5 - 5.2 g/dL - 4.7 4.4  AST 10 - 35 U/L 24 22 40(H)  ALT 9 - 46 U/L 45 29 74(H)  Alk Phosphatase 39 - 117 U/L - 60 51  Total Bilirubin 0.2 - 1.2 mg/dL 1.0 1.1 0.7  Bilirubin, Direct 0.0 - 0.2 mg/dL 0.2 0.2 0.2     The 10-year ASCVD risk score Mikey Bussing DC Jr., et al., 2013) is: 18.5%   Values used to calculate the score:     Age: 74 years     Sex: Male     Is Non-Hispanic African American: No     Diabetic: No     Tobacco smoker: No     Systolic Blood Pressure: 938 mmHg     Is BP treated: Yes     HDL Cholesterol: 61 mg/dL     Total Cholesterol: 149 mg/dL   Patient has failed these meds in past:  n/a Patient is currently controlled on the following medications:  . Atorvastatin 20 mg daily   We discussed:  diet and exercise extensively. Does not eat any red meat, but does eat chicken/turkey frequently.   Plan  Continue current medications  BPH   PSA  Date Value Ref Range Status  01/05/2020 0.8 < OR = 4.0 ng/mL Final    Comment:    The total PSA value from this assay system is  standardized against the WHO standard. The test  result will be approximately 20% lower when compared  to the equimolar-standardized total PSA (Beckman  Coulter). Comparison of serial PSA results should be  interpreted with this fact in mind. . This test was performed using the Siemens  chemiluminescent method. Values obtained from  different assay methods cannot be used interchangeably.  PSA levels, regardless of value, should not be interpreted as absolute evidence of the presence or absence of disease.   01/02/2019 0.93 0.10 - 4.00 ng/mL Final    Comment:    Test performed using Access Hybritech PSA Assay, a parmagnetic partical, chemiluminecent immunoassay.  01/13/2018 1.08 0.10 - 4.00 ng/mL Final    Comment:    Test performed using Access Hybritech PSA Assay, a parmagnetic partical, chemiluminecent immunoassay.     Patient has failed these meds in past: n/a Patient is currently controlled on the following medications:  . Finasteride 5 mg daily  . Tamsulosin 0.4 mg daily   We discussed:  Wakes 3 times nightly. Multiple times during the day.    Plan  Continue current medications  Osteoarthritis   Sees Dr. Tamala Julian (Sports Medicine) and receives Synvisc injections.   Patient has failed these meds in past: n/a Patient is currently controlled on the following medications:  Marland Kitchen Glucosamine 2 capsules (taking 6-8 months)    We discussed:  Synvisc has provided some relief. No benefit from glucosamine despite taking for 6-8 months  Plan  Recommend stopping glucosamine due to lack of  benefit.  Recommend starting Acetaminophen 650 mg CR q8hr PRN   Misc / OTC    . Ondansetron 4 mg  . Sildenafil 100 mg daily PRN (never got around to picking up at the store)   Plan  Continue current medications  Vaccines   Reviewed and discussed patient's vaccination history.    Immunization History  Administered Date(s) Administered  . Influenza Split 02/28/2011, 06/15/2012  . Influenza Whole 03/27/2009, 07/19/2010  . Influenza, High Dose Seasonal PF 07/20/2014, 02/24/2015, 04/04/2017, 03/27/2018, 02/20/2019  . Influenza,inj,Quad PF,6+ Mos 06/26/2013  . Pneumococcal Conjugate-13 01/09/2017  . Pneumococcal Polysaccharide-23 03/27/2018  . Tdap 09/22/2015  . Zoster Recombinat (Shingrix) 02/20/2019, 05/01/2019   Patient received Covid vaccine in January 2021.   Medication Management   [facility] Uses pill box? Yes, fills himself.   Plan  Continue current medication management strategy   Follow up: 12 month phone visit  [facility]

## 2020-02-19 ENCOUNTER — Telehealth (INDEPENDENT_AMBULATORY_CARE_PROVIDER_SITE_OTHER): Payer: Medicare HMO | Admitting: Internal Medicine

## 2020-02-19 ENCOUNTER — Encounter: Payer: Self-pay | Admitting: Internal Medicine

## 2020-02-19 ENCOUNTER — Other Ambulatory Visit: Payer: Self-pay

## 2020-02-19 VITALS — Wt 235.0 lb

## 2020-02-19 DIAGNOSIS — J069 Acute upper respiratory infection, unspecified: Secondary | ICD-10-CM

## 2020-02-19 NOTE — Progress Notes (Signed)
Virtual Visit via Telephone Note  I connected with Calvin [name] on 02/19/20 at 11:30 AM EDT by telephone and verified that I am speaking with the correct person using two identifiers.   I discussed the limitations, risks, security and privacy concerns of performing an evaluation and management service by telephone and the availability of in person appointments. I also discussed with the patient that there may be a patient responsible charge related to this service. The patient expressed understanding and agreed to proceed.  Location patient: home Location provider: work office Participants present for the call: patient, provider Patient did not have a visit in the prior 7 days to address this/these issue(s).   History of Present Illness:  COVID positive beginning of August, fully vaccinated. Recovered quickly after 5 days. 10 days ago started having severe nasal congestion, "sinus HA" and difficulty sleeping because of the congestion. No fever, SOB, mild cough. Nasal secretions have been clear. No ear pain or sore throat.   Observations/Objective: Patient sounds cheerful and well on the phone. I do not appreciate any increased work of breathing. Speech and thought processing are grossly intact. Patient reported vitals: none reported   Current Outpatient Medications:  .  amLODipine (NORVASC) 5 MG tablet, TAKE 1 TABLET BY MOUTH EVERY DAY, Disp: 90 tablet, Rfl: 0 .  atorvastatin (LIPITOR) 20 MG tablet, TAKE 1 TABLET EVERY DAY, Disp: 90 tablet, Rfl: 0 .  Coenzyme Q10 (CO Q-10) 100 MG CAPS, Take by mouth daily., Disp: , Rfl:  .  diclofenac sodium (VOLTAREN) 1 % GEL, Apply topically to affected area qid, Disp: 100 g, Rfl: 1 .  doxycycline (MONODOX) 50 MG capsule, Take 50 mg by mouth daily. , Disp: , Rfl:  .  finasteride (PROSCAR) 5 MG tablet, TAKE 1 TABLET BY MOUTH EVERY DAY, Disp: 90 tablet, Rfl: 0 .  glucosamine-chondroitin 500-400 MG tablet, Take 1 tablet by mouth daily., Disp: ,  Rfl:  .  hydrochlorothiazide (HYDRODIURIL) 25 MG tablet, TAKE 1 TABLET BY MOUTH DAILY, Disp: 90 tablet, Rfl: 0 .  HYDROcodone-acetaminophen (NORCO) 10-325 MG tablet, TK 1 T PO Q 6 H PRN P, Disp: , Rfl:  .  losartan (COZAAR) 100 MG tablet, TAKE 1 TABLET BY MOUTH EVERY DAY, Disp: 90 tablet, Rfl: 0 .  ondansetron (ZOFRAN) 4 MG tablet, , Disp: , Rfl:  .  oxyCODONE-acetaminophen (PERCOCET) 10-325 MG tablet, , Disp: , Rfl:  .  sildenafil (VIAGRA) 100 MG tablet, Take 1 tablet (100 mg total) by mouth daily as needed for erectile dysfunction., Disp: 30 tablet, Rfl: 11 .  tamsulosin (FLOMAX) 0.4 MG CAPS capsule, TAKE 1 CAPSULE BY MOUTH EVERY DAY, Disp: 90 capsule, Rfl: 0  Review of Systems:  Constitutional: Denies fever, chills, diaphoresis, fatigue.  HEENT: Denies photophobia, eye pain, redness, hearing loss, ear pain,  mouth sores, trouble swallowing, neck pain, neck stiffness and tinnitus.   Respiratory: Denies SOB, DOE, chest tightness,  and wheezing.   Cardiovascular: Denies chest pain, palpitations and leg swelling.  Gastrointestinal: Denies nausea, vomiting, abdominal pain, diarrhea, constipation, blood in stool and abdominal distention.  Genitourinary: Denies dysuria, urgency, frequency, hematuria, flank pain and difficulty urinating.  Endocrine: Denies: hot or cold intolerance, sweats, changes in hair or nails, polyuria, polydipsia. Musculoskeletal: Denies myalgias, back pain, joint swelling, arthralgias and gait problem.  Skin: Denies pallor, rash and wound.  Neurological: Denies dizziness, seizures, syncope, weakness, light-headedness, numbness and headaches.  Hematological: Denies adenopathy. Easy bruising, personal or family bleeding history  Psychiatric/Behavioral: Denies  suicidal ideation, mood changes, confusion, nervousness, sleep disturbance and agitation   Assessment and Plan:  Upper respiratory tract infection, unspecified type -Given presentation, PNA, pharyngitis, ear  infection are not likely, hence abx have not been prescribed. -Have advised rest, fluids, OTC antihistamines, cough suppressants and mucinex. -RTC if no improvement in 10-14 days.    I discussed the assessment and treatment plan with the patient. The patient was provided an opportunity to ask questions and all were answered. The patient agreed with the plan and demonstrated an understanding of the instructions.   The patient was advised to call back or seek an in-person evaluation if the symptoms worsen or if the condition fails to improve as anticipated.  I provided 16 minutes of non-face-to-face time during this encounter.   Lelon Frohlich, MD [facility]

## 2020-02-23 ENCOUNTER — Other Ambulatory Visit: Payer: Self-pay

## 2020-02-24 ENCOUNTER — Ambulatory Visit (INDEPENDENT_AMBULATORY_CARE_PROVIDER_SITE_OTHER): Payer: Medicare HMO | Admitting: Family Medicine

## 2020-02-24 ENCOUNTER — Encounter: Payer: Self-pay | Admitting: Family Medicine

## 2020-02-24 VITALS — BP 138/88 | HR 84 | Temp 97.9°F | Wt 247.8 lb

## 2020-02-24 DIAGNOSIS — J019 Acute sinusitis, unspecified: Secondary | ICD-10-CM

## 2020-02-24 DIAGNOSIS — U071 COVID-19: Secondary | ICD-10-CM | POA: Diagnosis not present

## 2020-02-24 HISTORY — DX: COVID-19: U07.1

## 2020-02-24 MED ORDER — METHYLPREDNISOLONE ACETATE 40 MG/ML IJ SUSP
40.0000 mg | Freq: Once | INTRAMUSCULAR | Status: AC
  Administered 2020-02-24: 40 mg via INTRAMUSCULAR

## 2020-02-24 MED ORDER — METHYLPREDNISOLONE ACETATE 80 MG/ML IJ SUSP
80.0000 mg | Freq: Once | INTRAMUSCULAR | Status: AC
  Administered 2020-02-24: 80 mg via INTRAMUSCULAR

## 2020-02-24 MED ORDER — AMOXICILLIN-POT CLAVULANATE 875-125 MG PO TABS: 1.0000 | ORAL_TABLET | Freq: Two times a day (BID) | ORAL | 0 refills | Status: DC

## 2020-02-24 NOTE — Progress Notes (Signed)
° °  Subjective:    Patient ID: Calvin [name], male    DOB: [birth year], 74 y.o.   [name]: [id number]  HPI Here for what he thinks is a sinus infection. On 01-23-20 while he was in [place], [place] he developed body ache and a low grade fever. That day he tested positive for the Covid-19 virus. He rested and drank fluids, and this all resolved within a few days. He felt fine until 2 weeks ago when he developed sinus congestion, PND, blowing green mucus from the nose, and headaches. No fever or ST. No cough or SOB. No NVD. He has been taking Mucinex and Nyquil.    Review of Systems  Constitutional: Negative.   HENT: Positive for congestion, postnasal drip, sinus pressure and sinus pain. Negative for ear pain and sore throat.   Eyes: Negative.   Respiratory: Negative.   Gastrointestinal: Negative.        Objective:   Physical Exam Constitutional:      Appearance: Normal appearance. He is not ill-appearing.  HENT:     Right Ear: Tympanic membrane, ear canal and external ear normal.     Left Ear: Tympanic membrane, ear canal and external ear normal.     Nose: Nose normal.     Mouth/Throat:     Pharynx: Oropharynx is clear.  Eyes:     Conjunctiva/sclera: Conjunctivae normal.  Cardiovascular:     Rate and Rhythm: Normal rate and regular rhythm.     Pulses: Normal pulses.     Heart sounds: Normal heart sounds.  Pulmonary:     Effort: Pulmonary effort is normal.     Breath sounds: Normal breath sounds.  Lymphadenopathy:     Cervical: No cervical adenopathy.  Neurological:     Mental Status: He is alert.           Assessment & Plan:  Sinusitis, treat with Augmentin and a SoluMedrol shot. Recheck prn. Alysia Penna, MD

## 2020-02-24 NOTE — Addendum Note (Signed)
Addended by: Alverda Skeans on: 02/24/2020 05:18 PM   Modules accepted: Orders

## 2020-03-21 ENCOUNTER — Telehealth: Payer: Self-pay

## 2020-03-21 DIAGNOSIS — I10 Essential (primary) hypertension: Secondary | ICD-10-CM

## 2020-03-21 NOTE — Progress Notes (Addendum)
Chronic Care Management Pharmacy Assistant   Name: Calvin [name]  [name]: [id number] DOB: May 16, 1946  Reason for Encounter: Hypertension Disease State Call.  PCP : Laurey Morale, MD  Allergies:  No Known Allergies  Medications: Outpatient Encounter Medications as of 03/21/2020  Medication Sig   amLODipine (NORVASC) 5 MG tablet TAKE 1 TABLET BY MOUTH EVERY DAY   amoxicillin-clavulanate (AUGMENTIN) 875-125 MG tablet Take 1 tablet by mouth 2 (two) times daily.   atorvastatin (LIPITOR) 20 MG tablet TAKE 1 TABLET EVERY DAY   Coenzyme Q10 (CO Q-10) 100 MG CAPS Take by mouth daily.   diclofenac sodium (VOLTAREN) 1 % GEL Apply topically to affected area qid   finasteride (PROSCAR) 5 MG tablet TAKE 1 TABLET BY MOUTH EVERY DAY   glucosamine-chondroitin 500-400 MG tablet Take 1 tablet by mouth daily.   hydrochlorothiazide (HYDRODIURIL) 25 MG tablet TAKE 1 TABLET BY MOUTH DAILY   losartan (COZAAR) 100 MG tablet TAKE 1 TABLET BY MOUTH EVERY DAY   sildenafil (VIAGRA) 100 MG tablet Take 1 tablet (100 mg total) by mouth daily as needed for erectile dysfunction.   tamsulosin (FLOMAX) 0.4 MG CAPS capsule TAKE 1 CAPSULE BY MOUTH EVERY DAY   No facility-administered encounter medications on file as of 03/21/2020.    Current Diagnosis: Patient Active Problem List   Diagnosis Date Noted   COVID-19 virus infection 02/24/2020   Sprain of index finger, initial encounter 04/23/2018   Primary osteoarthritis of left knee 04/23/2018   Urinary tract infection, site not specified 01/30/2016   Orchitis 01/30/2016   Epididymitis 01/19/2016   Hypogonadism in male 01/19/2016   Foot swelling 01/19/2016   Testicular pain, right    Depression 12/08/2014   Insomnia 10/30/2013   INTERNAL HEMORRHOIDS WITH OTHER COMPLICATION 01/60/1093   BPH (benign prostatic hyperplasia) 10/25/2008   NEPHROLITHIASIS, HX OF 10/25/2008   Headache 09/15/2007   Essential hypertension 07/07/2007      Follow-Up:  Pharmacist Review  Reviewed chart prior to disease state call. Spoke with patient regarding BP  Recent Office Vitals: BP Readings from Last 3 Encounters:  02/24/20 138/88  01/05/20 124/70  11/30/19 104/66   Pulse Readings from Last 3 Encounters:  02/24/20 84  01/05/20 82  11/30/19 (!) 101    Wt Readings from Last 3 Encounters:  02/24/20 247 lb 12.8 oz (112.4 kg)  02/19/20 235 lb (106.6 kg)  01/05/20 239 lb 12.8 oz (108.8 kg)     Kidney Function Lab Results  Component Value Date/Time   CREATININE 0.84 01/05/2020 10:47 AM   CREATININE 0.86 01/02/2019 12:32 PM   CREATININE 0.81 01/13/2018 03:08 PM   GFR 87.54 01/02/2019 12:32 PM   GFRNONAA >60 01/21/2016 04:38 AM   GFRAA >60 01/21/2016 04:38 AM    BMP Latest Ref Rng & Units 01/05/2020 01/02/2019 01/13/2018  Glucose 65 - 99 mg/dL 99 97 89  BUN 7 - 25 mg/dL 14 17 16   Creatinine 0.70 - 1.18 mg/dL 0.84 0.86 0.81  BUN/Creat Ratio 6 - 22 (calc) NOT APPLICABLE - -  Sodium 235 - 146 mmol/L 140 138 140  Potassium 3.5 - 5.3 mmol/L 3.7 3.7 3.5  Chloride 98 - 110 mmol/L 104 101 102  CO2 20 - 32 mmol/L 23 27 24   Calcium 8.6 - 10.3 mg/dL 9.5 9.7 9.8     Current antihypertensive regimen:   Amlodipine 5 mg daily   Hydrochlorothiazide 25 mg daily    Losartan 100 mg daily   How often are you checking  your Blood Pressure? daily  Current home BP readings:  o Patient states he checks his blood pressure daily, but he is in [place] and does not having his log on hand. Patient repots his blood ranging 135's.   What recent interventions/DTPs have been made by any provider to improve Blood Pressure control since last CPP Visit: None ID  Any recent hospitalizations or ED visits since last visit with CPP? No  What diet changes have been made to improve Blood Pressure Control?  o Patient states when he is at home he cooks at home but when he is in [place] he eats out.  What exercise is being done to improve your Blood  Pressure Control?  o Patient reports he goes to the gym 2 to 3 times a week.  Patient question when is he able to get his Covid booster shot, and how long does he need to wait after he had Covid.  Per Sharon Seller months after the second dose, and typically 90 days from when you had covid.  Patient verbalized understanding.  Adherence Review: Is the patient currently on ACE/ARB medication? Yes Does the patient have >5 day gap between last estimated fill dates? No   Anderson Malta Clinical Pharmacist Assistant [phone]    Maryjean Ka

## 2020-03-22 DIAGNOSIS — L820 Inflamed seborrheic keratosis: Secondary | ICD-10-CM | POA: Diagnosis not present

## 2020-03-22 DIAGNOSIS — D692 Other nonthrombocytopenic purpura: Secondary | ICD-10-CM | POA: Diagnosis not present

## 2020-03-22 DIAGNOSIS — L821 Other seborrheic keratosis: Secondary | ICD-10-CM | POA: Diagnosis not present

## 2020-03-22 DIAGNOSIS — D485 Neoplasm of uncertain behavior of skin: Secondary | ICD-10-CM | POA: Diagnosis not present

## 2020-03-22 DIAGNOSIS — Z85828 Personal history of other malignant neoplasm of skin: Secondary | ICD-10-CM | POA: Diagnosis not present

## 2020-03-22 DIAGNOSIS — C44519 Basal cell carcinoma of skin of other part of trunk: Secondary | ICD-10-CM | POA: Diagnosis not present

## 2020-03-22 DIAGNOSIS — L905 Scar conditions and fibrosis of skin: Secondary | ICD-10-CM | POA: Diagnosis not present

## 2020-03-22 DIAGNOSIS — L910 Hypertrophic scar: Secondary | ICD-10-CM | POA: Diagnosis not present

## 2020-03-24 ENCOUNTER — Other Ambulatory Visit: Payer: Self-pay | Admitting: Family Medicine

## 2020-03-28 ENCOUNTER — Telehealth: Payer: Self-pay | Admitting: Family Medicine

## 2020-03-28 NOTE — Telephone Encounter (Signed)
Left message for patient to schedule Annual Wellness Visit.  Please schedule with Nurse Health Advisor Shannon Crews, RN at [facility]  

## 2020-04-01 DIAGNOSIS — R69 Illness, unspecified: Secondary | ICD-10-CM | POA: Diagnosis not present

## 2020-04-05 ENCOUNTER — Telehealth: Payer: Self-pay | Admitting: *Deleted

## 2020-04-05 NOTE — Telephone Encounter (Signed)
Patient is returning call. [phone]

## 2020-04-05 NOTE — Telephone Encounter (Signed)
Appointment scheduled for 04/14/20.

## 2020-04-05 NOTE — Telephone Encounter (Signed)
Call and schedule per note below copied from 03/28/20 TE:  Left message for patient to schedule Annual Wellness Visit.  Please schedule with Nurse Health Advisor Ofilia Neas, RN at [facility] [employer]

## 2020-04-14 ENCOUNTER — Telehealth: Payer: Self-pay | Admitting: Family Medicine

## 2020-04-14 ENCOUNTER — Ambulatory Visit (INDEPENDENT_AMBULATORY_CARE_PROVIDER_SITE_OTHER): Payer: Medicare HMO

## 2020-04-14 ENCOUNTER — Other Ambulatory Visit: Payer: Self-pay

## 2020-04-14 DIAGNOSIS — Z0000 Encounter for general adult medical examination without abnormal findings: Secondary | ICD-10-CM | POA: Diagnosis not present

## 2020-04-14 DIAGNOSIS — Z1211 Encounter for screening for malignant neoplasm of colon: Secondary | ICD-10-CM | POA: Diagnosis not present

## 2020-04-14 NOTE — Progress Notes (Signed)
Subjective:   Calvin [name] is a 74 y.o. male who presents for an Initial Medicare Annual Wellness Visit.   I connected with [name] today by telephone and verified that I am speaking with the correct person using two identifiers. Location patient: home Location provider: work Persons participating in the virtual visit: patient, provider.   I discussed the limitations, risks, security and privacy concerns of performing an evaluation and management service by telephone and the availability of in person appointments. I also discussed with the patient that there may be a patient responsible charge related to this service. The patient expressed understanding and verbally consented to this telephonic visit.    Interactive audio and video telecommunications were attempted between this provider and patient, however failed, due to patient having technical difficulties OR patient did not have access to video capability.  We continued and completed visit with audio only.     Review of Systems    N/A  Cardiac Risk Factors include: advanced age (>53men, >72 women)     Objective:    Today's Vitals   There is no height or weight on file to calculate BMI.  Advanced Directives 04/14/2020 09/18/2017 01/19/2016 09/22/2015 03/12/2012  Does Patient Have a Medical Advance Directive? Yes Yes Yes No Patient does not have advance directive  Type of Advance Directive [place];Living will - Living will;Healthcare Power of Attorney - -  Does patient want to make changes to medical advance directive? No - Patient declined - - - -  Copy of [place] in Chart? No - copy requested - - - -  Would patient like information on creating a medical advance directive? - - - No - patient declined information -  Pre-existing out of facility DNR order (yellow form or pink MOST form) - - - - No    Current Medications (verified) Outpatient Encounter Medications as of 04/14/2020    Medication Sig  . amLODipine (NORVASC) 5 MG tablet Take 1 tablet (5 mg total) by mouth daily.  Marland Kitchen atorvastatin (LIPITOR) 20 MG tablet Take 1 tablet (20 mg total) by mouth daily.  . Coenzyme Q10 (CO Q-10) 100 MG CAPS Take by mouth daily.  . diclofenac sodium (VOLTAREN) 1 % GEL Apply topically to affected area qid  . finasteride (PROSCAR) 5 MG tablet Take 1 tablet (5 mg total) by mouth daily.  Marland Kitchen glucosamine-chondroitin 500-400 MG tablet Take 1 tablet by mouth daily.  . hydrochlorothiazide (HYDRODIURIL) 25 MG tablet Take 1 tablet (25 mg total) by mouth daily.  Marland Kitchen losartan (COZAAR) 100 MG tablet Take 1 tablet (100 mg total) by mouth daily.  . sildenafil (VIAGRA) 100 MG tablet Take 1 tablet (100 mg total) by mouth daily as needed for erectile dysfunction.  . tamsulosin (FLOMAX) 0.4 MG CAPS capsule Take 1 capsule (0.4 mg total) by mouth daily.  . [DISCONTINUED] amoxicillin-clavulanate (AUGMENTIN) 875-125 MG tablet Take 1 tablet by mouth 2 (two) times daily.   No facility-administered encounter medications on file as of 04/14/2020.    Allergies (verified) Patient has no known allergies.   History: Past Medical History:  Diagnosis Date  . Benign prostatic hypertrophy   . ED (erectile dysfunction)   . Headache(784.0)   . Hypertension   . Hypogonadism male   . Nephrolithiasis    hx of had lithotripsy in 1-10.   Past Surgical History:  Procedure Laterality Date  . colonoscopy  03-28-10   per Dr. Fuller Plan, hyperplastic polyps, repeat in 10 yrs   .  FOOT SURGERY    . LAPAROSCOPIC APPENDECTOMY  03/12/2012   Procedure: APPENDECTOMY LAPAROSCOPIC;  Surgeon: Harl Bowie, MD;  Location: [facility];  Service: General;  Laterality: N/A;  . Levert Feinstein HEMORRHOIDECTOMY  04-14-10   per Dr. Johney Maine   . TONSILLECTOMY     Family History  Problem Relation Age of Onset  . Hypertension Other   . Stroke Other   . Heart disease Other        rheumatic   . Coronary artery disease Other    Social History    Socioeconomic History  . Marital status: Widowed    Spouse name: Not on file  . Number of children: Not on file  . Years of education: Not on file  . Highest education level: Not on file  Occupational History  . Not on file  Tobacco Use  . Smoking status: Never Smoker  . Smokeless tobacco: Never Used  Substance and Sexual Activity  . Alcohol use: Yes    Alcohol/week: 0.0 standard drinks    Comment: "to much" drinks vodka   . Drug use: No  . Sexual activity: Not on file  Other Topics Concern  . Not on file  Social History Narrative  . Not on file   Social Determinants of Health   Financial Resource Strain: Low Risk   . Difficulty of Paying Living Expenses: Not hard at all  Food Insecurity: No Food Insecurity  . Worried About Charity fundraiser in the Last Year: Never true  . Ran Out of Food in the Last Year: Never true  Transportation Needs: No Transportation Needs  . Lack of Transportation (Medical): No  . Lack of Transportation (Non-Medical): No  Physical Activity: Insufficiently Active  . Days of Exercise per Week: 4 days  . Minutes of Exercise per Session: 30 min  Stress: No Stress Concern Present  . Feeling of Stress : Not at all  Social Connections: Socially Isolated  . Frequency of Communication with Friends and Family: More than three times a week  . Frequency of Social Gatherings with Friends and Family: Once a week  . Attends Religious Services: Never  . Active Member of Clubs or Organizations: No  . Attends Archivist Meetings: Never  . Marital Status: Widowed    Tobacco Counseling Counseling given: Not Answered   Clinical Intake:  Pre-visit preparation completed: Yes  Pain : No/denies pain     Nutritional Risks: None Diabetes: No  How often do you need to have someone help you when you read instructions, pamphlets, or other written materials from your doctor or pharmacy?: 1 - Never What is the last grade level you completed in  school?: Associates Degreee  Diabetic?No   Interpreter Needed?: No  Information entered by :: [place] of Daily Living In your present state of health, do you have any difficulty performing the following activities: 04/14/2020  Hearing? Y  Comment patient has bilateral hearing aids  Vision? N  Difficulty concentrating or making decisions? N  Walking or climbing stairs? Y  Comment Has knee pain  Dressing or bathing? N  Doing errands, shopping? N  Preparing Food and eating ? N  Using the Toilet? N  In the past six months, have you accidently leaked urine? Y  Comment has bladder leakage  Do you have problems with loss of bowel control? N  Managing your Medications? N  Managing your Finances? N  Housekeeping or managing your Housekeeping? N  Some recent data  might be hidden    Patient Care Team: Laurey Morale, MD as PCP - General Michaelle Birks Glean Salvo, [facility] as Pharmacist (Pharmacist)  Indicate any recent Medical Services you may have received from other than [facility] providers in the past year (date may be approximate).     Assessment:   This is a routine wellness examination for [name].  Hearing/Vision screen  Hearing Screening   125Hz  250Hz  500Hz  1000Hz  2000Hz  3000Hz  4000Hz  6000Hz  8000Hz   Right ear:           Left ear:           Vision Screening Comments: Patient states gets eyes examined once per year   Dietary issues and exercise activities discussed: Current Exercise Habits: Home exercise routine, Type of exercise: treadmill;strength training/weights, Time (Minutes): 30, Frequency (Times/Week): 4, Weekly Exercise (Minutes/Week): 120, Intensity: Mild, Exercise limited by: orthopedic condition(s)  Goals    . Chronic Care Management     CARE PLAN ENTRY (see longitudinal plan of care for additional care plan information)  Current Barriers:  . Chronic Disease Management support, education, and care coordination needs related to Hypertension,  Hyperlipidemia, Depression, Osteoarthritis, BPH and Insomnia    Hypertension BP Readings from Last 3 Encounters:  01/05/20 124/70  11/30/19 104/66  11/02/19 118/60   . Pharmacist Clinical Goal(s): o Over the next 90 days, patient will work with PharmD and providers to maintain BP goal <130/80 . Current regimen:  . Amlodipine 5 mg daily  . Hydrochlorothiazide 25 mg daily   . Losartan 100 mg daily  . Interventions: o Discussed low salt diet and exercising as tolerated extensively o Recommend taking Hydrochlorothiazide in the morning . Patient self care activities - Over the next 90 days, patient will: o Check blood pressure 1-2 times weekly, document, and provide at future appointments o Ensure daily salt intake < 2300 mg/day  Hyperlipidemia Lab Results  Component Value Date/Time   LDLCALC 67 01/05/2020 10:47 AM   LDLDIRECT 157.1 02/16/2011 08:49 AM   . Pharmacist Clinical Goal(s): o Over the next 90 days, patient will work with PharmD and providers to maintain LDL goal < 100 . Current regimen:  o Atorvastatin 20 mg daily  . Interventions: o Discussed low cholesterol diet and exercising as tolerated extensively  Medication management . Pharmacist Clinical Goal(s): o Over the next 90 days, patient will work with PharmD and providers to maintain optimal medication adherence . Current pharmacy: [facility] . Interventions o Comprehensive medication review performed. o Continue current medication management strategy . Patient self care activities - Over the next 90 days, patient will: o Take medications as prescribed o Report any questions or concerns to PharmD and/or provider(s)    . Exercise 150 min/wk Moderate Activity     Will join the gym in [place] !    . Patient Stated     Stop drinking at least cut it back quit a bit Try to do some of the things you feel will be helpful Go back to the gym here and in [employer]      . Patient Stated     I will  continue to go to the gym 4-5 days per week      Depression Screen PHQ 2/9 Scores 04/14/2020 02/24/2020 01/02/2019 01/24/2018 01/13/2018 09/18/2017  PHQ - 2 Score 0 0 0 0 0 0  PHQ- 9 Score 0 - 1 - - -    Fall Risk Fall Risk  04/14/2020 05/11/2019 01/02/2019 01/13/2018 09/01/2014  Falls in the past year? 0 0 0 No No  Comment - Emmi Telephone Survey: data to providers prior to load - - -  Number falls in past yr: 0 - 0 - -  Injury with Fall? 0 - 0 - -  Risk for fall due to : No Fall Risks - - - -  Follow up Falls evaluation completed;Falls prevention discussed - - - -    Any stairs in or around the home? Yes  If so, are there any without handrails? No  Home free of loose throw rugs in walkways, pet beds, electrical cords, etc? Yes  Adequate lighting in your home to reduce risk of falls? Yes   ASSISTIVE DEVICES UTILIZED TO PREVENT FALLS:  Life alert? No  Use of a cane, walker or w/c? No  Grab bars in the bathroom? Yes  Shower chair or bench in shower? No  Elevated toilet seat or a handicapped toilet? No   Cognitive Function: MMSE - Mini Mental State Exam 09/18/2017  Not completed: (No Data)        Immunizations Immunization History  Administered Date(s) Administered  . Fluad Quad(high Dose 65+) 04/08/2020  . Influenza Split 02/28/2011, 06/15/2012  . Influenza Whole 03/27/2009, 07/19/2010  . Influenza, High Dose Seasonal PF 07/20/2014, 02/24/2015, 04/04/2017, 03/27/2018, 02/20/2019  . Influenza,inj,Quad PF,6+ Mos 06/26/2013  . PFIZER SARS-COV-2 Vaccination 07/03/2019, 08/03/2019  . Pneumococcal Conjugate-13 01/09/2017  . Pneumococcal Polysaccharide-23 03/27/2018  . Tdap 09/22/2015  . Zoster Recombinat (Shingrix) 02/20/2019, 05/01/2019    TDAP status: Up to date Flu Vaccine status: Up to date Pneumococcal vaccine status: Up to date Covid-19 vaccine status: Completed vaccines  Qualifies for Shingles Vaccine? Yes   Zostavax completed No   Shingrix Completed?: Yes  Screening  Tests Health Maintenance  Topic Date Due  . Hepatitis C Screening  Never done  . COLONOSCOPY  03/28/2020  . TETANUS/TDAP  09/21/2025  . INFLUENZA VACCINE  Completed  . COVID-19 Vaccine  Completed  . PNA vac Low Risk Adult  Completed    Health Maintenance  Health Maintenance Due  Topic Date Due  . Hepatitis C Screening  Never done  . COLONOSCOPY  03/28/2020    Colorectal cancer screening: Referral to GI placed 04/14/2020. Pt aware the office will call re: appt.  Lung Cancer Screening: (Low Dose CT Chest recommended if Age 66-80 years, 30 pack-year currently smoking OR have quit w/in 15years.) does not qualify.   Lung Cancer Screening Referral: N/A  Additional Screening:  Hepatitis C Screening: does qualify;   Vision Screening: Recommended annual ophthalmology exams for early detection of glaucoma and other disorders of the eye. Is the patient up to date with their annual eye exam?  Yes  Who is the provider or what is the name of the office in which the patient attends annual eye exams? [facility] doctor  If pt is not established with a provider, would they like to be referred to a provider to establish care? No .   Dental Screening: Recommended annual dental exams for proper oral hygiene  Community Resource Referral / Chronic Care Management: CRR required this visit?  No   CCM required this visit?  No      Plan:     I have personally reviewed and noted the following in the patient's chart:   . Medical and social history . Use of alcohol, tobacco or illicit drugs  . Current medications and supplements . Functional ability and status . Nutritional status .  Physical activity . Advanced directives . List of other physicians . Hospitalizations, surgeries, and ER visits in previous 12 months . Vitals . Screenings to include cognitive, depression, and falls . Referrals and appointments  In addition, I have reviewed and discussed with patient certain  preventive protocols, quality metrics, and best practice recommendations. A written personalized care plan for preventive services as well as general preventive health recommendations were provided to patient.     Ofilia Neas, LPN   [id number]   Nurse Notes: None

## 2020-04-14 NOTE — Patient Instructions (Signed)
[name] , Thank you for taking time to come for your Medicare Wellness Visit. I appreciate your ongoing commitment to your health goals. Please review the following plan we discussed and let me know if I can assist you in the future.   Screening recommendations/referrals: Colonoscopy: Currently due, orders placed this visit  Recommended yearly ophthalmology/optometry visit for glaucoma screening and checkup Recommended yearly dental visit for hygiene and checkup  Vaccinations: Influenza vaccine: Up to date, next due fall 2022 Pneumococcal vaccine: Completed series Tdap vaccine: Up to date, next due 09/21/2025 Shingles vaccine: Completed series    Advanced directives: please bring in a copy of your advanced medical directives into the office so that we may scan them into your chart.  Conditions/risks identified: None  Next appointment: 02/06/2021 @ 11:00 am with the Pharmacist of [place] 65 Years and Older, Male Preventive care refers to lifestyle choices and visits with your health care provider that can promote health and wellness. What does preventive care include?  A yearly physical exam. This is also called an annual well check.  Dental exams once or twice a year.  Routine eye exams. Ask your health care provider how often you should have your eyes checked.  Personal lifestyle choices, including:  Daily care of your teeth and gums.  Regular physical activity.  Eating a healthy diet.  Avoiding tobacco and drug use.  Limiting alcohol use.  Practicing safe sex.  Taking low doses of aspirin every day.  Taking vitamin and mineral supplements as recommended by your health care provider. What happens during an annual well check? The services and screenings done by your health care provider during your annual well check will depend on your age, overall health, lifestyle risk factors, and family history of disease. Counseling  Your health  care provider may ask you questions about your:  Alcohol use.  Tobacco use.  Drug use.  Emotional well-being.  Home and relationship well-being.  Sexual activity.  Eating habits.  History of falls.  Memory and ability to understand (cognition).  Work and work Statistician. Screening  You may have the following tests or measurements:  Height, weight, and BMI.  Blood pressure.  Lipid and cholesterol levels. These may be checked every 5 years, or more frequently if you are over 75 years old.  Skin check.  Lung cancer screening. You may have this screening every year starting at age 72 if you have a 30-pack-year history of smoking and currently smoke or have quit within the past 15 years.  Fecal occult blood test (FOBT) of the stool. You may have this test every year starting at age 72.  Flexible sigmoidoscopy or colonoscopy. You may have a sigmoidoscopy every 5 years or a colonoscopy every 10 years starting at age 72.  Prostate cancer screening. Recommendations will vary depending on your family history and other risks.  Hepatitis C blood test.  Hepatitis B blood test.  Sexually transmitted disease (STD) testing.  Diabetes screening. This is done by checking your blood sugar (glucose) after you have not eaten for a while (fasting). You may have this done every 1-3 years.  Abdominal aortic aneurysm (AAA) screening. You may need this if you are a current or former smoker.  Osteoporosis. You may be screened starting at age 72 if you are at high risk. Talk with your health care provider about your test results, treatment options, and if necessary, the need for more tests. Vaccines  Your health care provider  may recommend certain vaccines, such as:  Influenza vaccine. This is recommended every year.  Tetanus, diphtheria, and acellular pertussis (Tdap, Td) vaccine. You may need a Td booster every 10 years.  Zoster vaccine. You may need this after age  72.  Pneumococcal 13-valent conjugate (PCV13) vaccine. One dose is recommended after age 72.  Pneumococcal polysaccharide (PPSV23) vaccine. One dose is recommended after age 72. Talk to your health care provider about which screenings and vaccines you need and how often you need them. This information is not intended to replace advice given to you by your health care provider. Make sure you discuss any questions you have with your health care provider. Document Released: 07/01/2015 Document Revised: 02/22/2016 Document Reviewed: 04/05/2015 Elsevier Interactive Patient Education  2017 [place] Prevention in the Home Falls can cause injuries. They can happen to people of all ages. There are many things you can do to make your home safe and to help prevent falls. What can I do on the outside of my home?  Regularly fix the edges of walkways and driveways and fix any cracks.  Remove anything that might make you trip as you walk through a door, such as a raised step or threshold.  Trim any bushes or trees on the path to your home.  Use bright outdoor lighting.  Clear any walking paths of anything that might make someone trip, such as rocks or tools.  Regularly check to see if handrails are loose or broken. Make sure that both sides of any steps have handrails.  Any raised decks and porches should have guardrails on the edges.  Have any leaves, snow, or ice cleared regularly.  Use sand or salt on walking paths during winter.  Clean up any spills in your garage right away. This includes oil or grease spills. What can I do in the bathroom?  Use night lights.  Install grab bars by the toilet and in the tub and shower. Do not use towel bars as grab bars.  Use non-skid mats or decals in the tub or shower.  If you need to sit down in the shower, use a plastic, non-slip stool.  Keep the floor dry. Clean up any water that spills on the floor as soon as it happens.  Remove  soap buildup in the tub or shower regularly.  Attach bath mats securely with double-sided non-slip rug tape.  Do not have throw rugs and other things on the floor that can make you trip. What can I do in the bedroom?  Use night lights.  Make sure that you have a light by your bed that is easy to reach.  Do not use any sheets or blankets that are too big for your bed. They should not hang down onto the floor.  Have a firm chair that has side arms. You can use this for support while you get dressed.  Do not have throw rugs and other things on the floor that can make you trip. What can I do in the kitchen?  Clean up any spills right away.  Avoid walking on wet floors.  Keep items that you use a lot in easy-to-reach places.  If you need to reach something above you, use a strong step stool that has a grab bar.  Keep electrical cords out of the way.  Do not use floor polish or wax that makes floors slippery. If you must use wax, use non-skid floor wax.  Do not have throw rugs  and other things on the floor that can make you trip. What can I do with my stairs?  Do not leave any items on the stairs.  Make sure that there are handrails on both sides of the stairs and use them. Fix handrails that are broken or loose. Make sure that handrails are as long as the stairways.  Check any carpeting to make sure that it is firmly attached to the stairs. Fix any carpet that is loose or worn.  Avoid having throw rugs at the top or bottom of the stairs. If you do have throw rugs, attach them to the floor with carpet tape.  Make sure that you have a light switch at the top of the stairs and the bottom of the stairs. If you do not have them, ask someone to add them for you. What else can I do to help prevent falls?  Wear shoes that:  Do not have high heels.  Have rubber bottoms.  Are comfortable and fit you well.  Are closed at the toe. Do not wear sandals.  If you use a  stepladder:  Make sure that it is fully opened. Do not climb a closed stepladder.  Make sure that both sides of the stepladder are locked into place.  Ask someone to hold it for you, if possible.  Clearly mark and make sure that you can see:  Any grab bars or handrails.  First and last steps.  Where the edge of each step is.  Use tools that help you move around (mobility aids) if they are needed. These include:  Canes.  Walkers.  Scooters.  Crutches.  Turn on the lights when you go into a dark area. Replace any light bulbs as soon as they burn out.  Set up your furniture so you have a clear path. Avoid moving your furniture around.  If any of your floors are uneven, fix them.  If there are any pets around you, be aware of where they are.  Review your medicines with your doctor. Some medicines can make you feel dizzy. This can increase your chance of falling. Ask your doctor what other things that you can do to help prevent falls. This information is not intended to replace advice given to you by your health care provider. Make sure you discuss any questions you have with your health care provider. Document Released: 03/31/2009 Document Revised: 11/10/2015 Document Reviewed: 07/09/2014 Elsevier Interactive Patient Education  2017 [employer].

## 2020-04-14 NOTE — Telephone Encounter (Signed)
LVM for pt to return call--Nurse visit needs to be a telephone call today.

## 2020-04-15 DIAGNOSIS — L905 Scar conditions and fibrosis of skin: Secondary | ICD-10-CM | POA: Diagnosis not present

## 2020-04-15 DIAGNOSIS — C44511 Basal cell carcinoma of skin of breast: Secondary | ICD-10-CM | POA: Diagnosis not present

## 2020-04-26 DIAGNOSIS — H524 Presbyopia: Secondary | ICD-10-CM | POA: Diagnosis not present

## 2020-05-25 DIAGNOSIS — Z0100 Encounter for examination of eyes and vision without abnormal findings: Secondary | ICD-10-CM | POA: Diagnosis not present

## 2020-05-30 ENCOUNTER — Telehealth: Payer: Self-pay | Admitting: Family Medicine

## 2020-05-30 NOTE — Telephone Encounter (Signed)
I cannot run him until January first if his appointment is 1/4 but if he gets in sooner ill need to know so I can run him prior to appointment [employer] prefers monovisc so we should be okay on that

## 2020-05-30 NOTE — Telephone Encounter (Signed)
Pt called to schedule gel injections, on [facility] schedule for 06/21/20 and added to wait list.  Unsure if Vis[employer] approval needed. Pt thought it had been 1 year, but he had Synvisc 11/2019 and Monovisc 03/1999  He thinks the Monovisc lasted longer.

## 2020-06-08 NOTE — Progress Notes (Signed)
[facility] [address] [place] Phone: [phone] Subjective:   I Calvin [name] am serving as a Education administrator for Dr. Hulan Saas.  This visit occurred during the SARS-CoV-2 public health emergency.  Safety protocols were in place, including screening questions prior to the visit, additional usage of staff PPE, and extensive cleaning of exam room while observing appropriate contact time as indicated for disinfecting solutions.   I'm seeing this patient by the request  of:  Laurey Morale, MD  CC: Left knee pain  [id number]   11/30/2019 Viscosupplementation given again today.  Chronic problem with exacerbation.  Encourage patient to continue the other medications.  Discussed pertinent prescription medications including patient's pain medications from another provider.  Icing regimen.  Discussed topical anti-inflammatories.  Follow-up again in 4 weeks   Update 06/09/2020 Calvin [name] is a 74 y.o. male coming in with complaint of left knee pain. Approved for bilateral visco. Patient states he is doing well today. Some days are better than others.  Patient is having worsening pain again.  Patient states it has been swollen recently.  Patient states that the knee sometimes gives him some mild increase in instability  Patient did respond fairly well in June 2021 with the viscosupplementation.   Patient is also having low back pain.  States that it seems to be bilateral right greater than left.  States it is worse when standing such as doing dishes or if patient has to walk very long distances.  Seems to get better almost immediately with sitting.  No nighttime pain.  No fevers chills or any abnormal weight loss  Past Medical History:  Diagnosis Date  . Benign prostatic hypertrophy   . ED (erectile dysfunction)   . Headache(784.0)   . Hypertension   . Hypogonadism male   . Nephrolithiasis    hx of had lithotripsy in 1-10.   Past Surgical  History:  Procedure Laterality Date  . colonoscopy  03-28-10   per Dr. Fuller Plan, hyperplastic polyps, repeat in 10 yrs   . FOOT SURGERY    . LAPAROSCOPIC APPENDECTOMY  03/12/2012   Procedure: APPENDECTOMY LAPAROSCOPIC;  Surgeon: Harl Bowie, MD;  Location: [facility];  Service: General;  Laterality: N/A;  . Levert Feinstein HEMORRHOIDECTOMY  04-14-10   per Dr. Johney Maine   . TONSILLECTOMY     Social History   Socioeconomic History  . Marital status: Widowed    Spouse name: Not on file  . Number of children: Not on file  . Years of education: Not on file  . Highest education level: Not on file  Occupational History  . Not on file  Tobacco Use  . Smoking status: Never Smoker  . Smokeless tobacco: Never Used  Substance and Sexual Activity  . Alcohol use: Yes    Alcohol/week: 0.0 standard drinks    Comment: "to much" drinks vodka   . Drug use: No  . Sexual activity: Not on file  Other Topics Concern  . Not on file  Social History Narrative  . Not on file   Social Determinants of Health   Financial Resource Strain: Low Risk   . Difficulty of Paying Living Expenses: Not hard at all  Food Insecurity: No Food Insecurity  . Worried About Charity fundraiser in the Last Year: Never true  . Ran Out of Food in the Last Year: Never true  Transportation Needs: No Transportation Needs  . Lack of Transportation (Medical): No  .  Lack of Transportation (Non-Medical): No  Physical Activity: Insufficiently Active  . Days of Exercise per Week: 4 days  . Minutes of Exercise per Session: 30 min  Stress: No Stress Concern Present  . Feeling of Stress : Not at all  Social Connections: Socially Isolated  . Frequency of Communication with Friends and Family: More than three times a week  . Frequency of Social Gatherings with Friends and Family: Once a week  . Attends Religious Services: Never  . Active Member of Clubs or Organizations: No  . Attends Archivist Meetings: Never  . Marital Status:  Widowed   No Known Allergies Family History  Problem Relation Age of Onset  . Hypertension Other   . Stroke Other   . Heart disease Other        rheumatic   . Coronary artery disease Other      Current Outpatient Medications (Cardiovascular):  .  amLODipine (NORVASC) 5 MG tablet, Take 1 tablet (5 mg total) by mouth daily. Marland Kitchen  atorvastatin (LIPITOR) 20 MG tablet, Take 1 tablet (20 mg total) by mouth daily. .  hydrochlorothiazide (HYDRODIURIL) 25 MG tablet, Take 1 tablet (25 mg total) by mouth daily. Marland Kitchen  losartan (COZAAR) 100 MG tablet, Take 1 tablet (100 mg total) by mouth daily. .  sildenafil (VIAGRA) 100 MG tablet, Take 1 tablet (100 mg total) by mouth daily as needed for erectile dysfunction.     Current Outpatient Medications (Other):  Marland Kitchen  Coenzyme Q10 (CO Q-10) 100 MG CAPS, Take by mouth daily. .  diclofenac sodium (VOLTAREN) 1 % GEL, Apply topically to affected area qid .  finasteride (PROSCAR) 5 MG tablet, Take 1 tablet (5 mg total) by mouth daily. Marland Kitchen  glucosamine-chondroitin 500-400 MG tablet, Take 1 tablet by mouth daily. .  tamsulosin (FLOMAX) 0.4 MG CAPS capsule, Take 1 capsule (0.4 mg total) by mouth daily.   Reviewed prior external information including notes and imaging from  primary care provider As well as notes that were available from care everywhere and other healthcare systems.  Past medical history, social, surgical and family history all reviewed in electronic medical record.  No pertanent information unless stated regarding to the chief complaint.   Review of Systems:  No headache, visual changes, nausea, vomiting, diarrhea, constipation, dizziness, abdominal pain, skin rash, fevers, chills, night sweats, weight loss, swollen lymph nodes, body aches, joint swelling, chest pain, shortness of breath, mood changes. POSITIVE muscle aches  Objective  Blood pressure 120/80, pulse 94, height 5\' 11"  (1.803 m), weight 243 lb (110.2 kg), SpO2 97 %.   General: No  apparent distress alert and oriented x3 mood and affect normal, dressed appropriately.  HEENT: Pupils equal, extraocular movements intact  Respiratory: Patient's speak in full sentences and does not appear short of breath  Cardiovascular: No lower extremity edema, non tender, no erythema  MSK: Left knee does have significant swelling noted.  Some mild instability noted with valgus and varus force.  Lacks the last 10 degrees of flexion in the last 5 degrees of extension.  Patient is tender over the medial joint line.  Low back exam does show that patient does have loss of lordosis.  Poor core strength.  Tenderness to palpation over the right sacroiliac joint and mild over the left sacroiliac joint.  No spinous process tenderness.  Tightness noted with Corky Sox.  Tightness with straight leg test with mild worsening pain greater than 10 degrees of extension  After informed written and verbal consent, patient  was seated on exam table. Left knee was prepped with alcohol swab and utilizing anterolateral approach, patient's left knee space was injected with 40 mg per 3 mL of Monovisc (sodium hyaluronate) in a prefilled syringe was injected easily into the knee through a 22-gauge needle..Patient tolerated the procedure well without immediate complications.    Impression and Recommendations:     The above documentation has been reviewed and is accurate and complete Judi Saa, DO

## 2020-06-09 ENCOUNTER — Encounter: Payer: Self-pay | Admitting: Family Medicine

## 2020-06-09 ENCOUNTER — Other Ambulatory Visit: Payer: Self-pay

## 2020-06-09 ENCOUNTER — Ambulatory Visit (INDEPENDENT_AMBULATORY_CARE_PROVIDER_SITE_OTHER): Payer: Medicare HMO | Admitting: Family Medicine

## 2020-06-09 ENCOUNTER — Ambulatory Visit (INDEPENDENT_AMBULATORY_CARE_PROVIDER_SITE_OTHER): Payer: Medicare HMO

## 2020-06-09 VITALS — BP 120/80 | HR 94 | Ht 71.0 in | Wt 243.0 lb

## 2020-06-09 DIAGNOSIS — G8929 Other chronic pain: Secondary | ICD-10-CM | POA: Diagnosis not present

## 2020-06-09 DIAGNOSIS — M545 Low back pain, unspecified: Secondary | ICD-10-CM

## 2020-06-09 DIAGNOSIS — M1712 Unilateral primary osteoarthritis, left knee: Secondary | ICD-10-CM

## 2020-06-09 DIAGNOSIS — M25562 Pain in left knee: Secondary | ICD-10-CM | POA: Diagnosis not present

## 2020-06-09 HISTORY — DX: Low back pain, unspecified: M54.50

## 2020-06-09 NOTE — Assessment & Plan Note (Signed)
Patient has had arthritic changes for this previously.  Has responded fairly well to the injections.  Hopefully this will make some improvement again.  Discussed with patient about anti-inflammatories for short course with patient having a small effusion.  Repeat x-rays today.  If patient still has moderate arthritic changes I do think potentially an MRI is necessary.  Patient could be a potential candidate for a arthroscopic procedure before he would need any type of replacement.  It just depends.  Patient will follow up with me again in 4 to 8 weeks

## 2020-06-09 NOTE — Assessment & Plan Note (Signed)
Low back pain that has symptoms that do seem to be more consistent with a spinal stenosis.  Worsening pain with standing or a lot of walking.  Patient seems to get better though if he does working out or sitting.  No pain at night and no radiation of pain.  Patient was given home exercises and will get x-rays today.  Worsening symptoms will consider gabapentin and formal physical therapy.  Follow-up again in 4 to 6 weeks

## 2020-06-09 NOTE — Patient Instructions (Addendum)
Back and knee xray today  Ice the knee 20 mins 2-3 times a day 3 ibuprofen 3 times a day No other antiinflammatories and stop if it hurts your stomach Exercise 3 times a week See me again in 3-4 weeks

## 2020-06-21 ENCOUNTER — Ambulatory Visit: Payer: Medicare HMO | Admitting: Family Medicine

## 2020-06-30 ENCOUNTER — Other Ambulatory Visit: Payer: Self-pay

## 2020-06-30 ENCOUNTER — Ambulatory Visit: Payer: Medicare HMO | Admitting: Family Medicine

## 2020-06-30 ENCOUNTER — Encounter: Payer: Self-pay | Admitting: Family Medicine

## 2020-06-30 VITALS — BP 122/70 | HR 88 | Ht 71.0 in | Wt 245.0 lb

## 2020-06-30 DIAGNOSIS — M25562 Pain in left knee: Secondary | ICD-10-CM | POA: Diagnosis not present

## 2020-06-30 DIAGNOSIS — M545 Low back pain, unspecified: Secondary | ICD-10-CM | POA: Diagnosis not present

## 2020-06-30 DIAGNOSIS — G8929 Other chronic pain: Secondary | ICD-10-CM | POA: Diagnosis not present

## 2020-06-30 DIAGNOSIS — M1712 Unilateral primary osteoarthritis, left knee: Secondary | ICD-10-CM

## 2020-06-30 NOTE — Assessment & Plan Note (Signed)
Patient does have spondylosis as well as facet arthropathy.  Patient does have signs and symptoms that correspond more with a spinal stenosis.  Patient wants to start again and conservatively.  Patient would like to start with formal physical therapy and will be referred today.  Discussed with patient about certain medications such as gabapentin but patient would like to avoid another prescription medication if possible.  Encourage patient to work on core strengthening and weight loss.  Follow-up with me again in 6 to 8 weeks

## 2020-06-30 NOTE — Assessment & Plan Note (Signed)
Patient does have arthritic changes of the knee moderate in nature.  Patient had responded very well to the viscosupplementation previously but now is having some difficulty again.  Patient did not respond as well to the viscosupplementation less than 1 month ago.  We discussed because patient only has moderate osteoarthritic changes at a possible advancing imaging such as an MRI could be beneficial to see if any type of arthroscopic procedure could be beneficial.  Patient at this point they would like to do conservative therapy and will refer to formal physical therapy which I think can be very helpful.  We will see how patient responds.  Follow-up with me again 6 to 8 weeks.

## 2020-06-30 NOTE — Patient Instructions (Signed)
PT Calvin [name] back and knee Give both a little more time Continue everything else See me again in 6-8 if not better we will consider imaging

## 2020-06-30 NOTE — Progress Notes (Signed)
[facility] [address] [place] Phone: [phone] Subjective:   I Calvin [name] am serving as a Education administrator for Dr. Hulan Saas.  This visit occurred during the SARS-CoV-2 public health emergency.  Safety protocols were in place, including screening questions prior to the visit, additional usage of staff PPE, and extensive cleaning of exam room while observing appropriate contact time as indicated for disinfecting solutions.   I'm seeing this patient by the request  of:  Laurey Morale, MD  CC: knee and back pain follow up   [id number]   06/09/2020 Low back pain that has symptoms that do seem to be more consistent with a spinal stenosis.  Worsening pain with standing or a lot of walking.  Patient seems to get better though if he does working out or sitting.  No pain at night and no radiation of pain.  Patient was given home exercises and will get x-rays today.  Worsening symptoms will consider gabapentin and formal physical therapy.  Follow-up again in 4 to 6 weeks  Patient has had arthritic changes for this previously.  Has responded fairly well to the injections.  Hopefully this will make some improvement again.  Discussed with patient about anti-inflammatories for short course with patient having a small effusion.  Repeat x-rays today.  If patient still has moderate arthritic changes I do think potentially an MRI is necessary.  Patient could be a potential candidate for a arthroscopic procedure before he would need any type of replacement.  It just depends.  Patient will follow up with me again in 4 to 8 weeks  Update 06/30/2020 Calvin [name] is a 75 y.o. male coming in with complaint of low back and left knee pain. Patient states his back is still causing him problems. Left knee bothers him occassionally and states the injection did not help as much.   Monovisc given 06/09/2020 Xray mild to moderate OA of the knee- ?MRI then if not better    Lumbar xray- spondylosis and facet arthropathy, stable T11 compression fracture   Past Medical History:  Diagnosis Date  . Benign prostatic hypertrophy   . ED (erectile dysfunction)   . Headache(784.0)   . Hypertension   . Hypogonadism male   . Nephrolithiasis    hx of had lithotripsy in 1-10.   Past Surgical History:  Procedure Laterality Date  . colonoscopy  03-28-10   per Dr. Fuller Plan, hyperplastic polyps, repeat in 10 yrs   . FOOT SURGERY    . LAPAROSCOPIC APPENDECTOMY  03/12/2012   Procedure: APPENDECTOMY LAPAROSCOPIC;  Surgeon: Harl Bowie, MD;  Location: [facility];  Service: General;  Laterality: N/A;  . Levert Feinstein HEMORRHOIDECTOMY  04-14-10   per Dr. Johney Maine   . TONSILLECTOMY     Social History   Socioeconomic History  . Marital status: Widowed    Spouse name: Not on file  . Number of children: Not on file  . Years of education: Not on file  . Highest education level: Not on file  Occupational History  . Not on file  Tobacco Use  . Smoking status: Never Smoker  . Smokeless tobacco: Never Used  Substance and Sexual Activity  . Alcohol use: Yes    Alcohol/week: 0.0 standard drinks    Comment: "to much" drinks vodka   . Drug use: No  . Sexual activity: Not on file  Other Topics Concern  . Not on file  Social History Narrative  . Not on  file   Social Determinants of Health   Financial Resource Strain: Low Risk   . Difficulty of Paying Living Expenses: Not hard at all  Food Insecurity: No Food Insecurity  . Worried About Charity fundraiser in the Last Year: Never true  . Ran Out of Food in the Last Year: Never true  Transportation Needs: No Transportation Needs  . Lack of Transportation (Medical): No  . Lack of Transportation (Non-Medical): No  Physical Activity: Insufficiently Active  . Days of Exercise per Week: 4 days  . Minutes of Exercise per Session: 30 min  Stress: No Stress Concern Present  . Feeling of Stress : Not at all  Social Connections:  Socially Isolated  . Frequency of Communication with Friends and Family: More than three times a week  . Frequency of Social Gatherings with Friends and Family: Once a week  . Attends Religious Services: Never  . Active Member of Clubs or Organizations: No  . Attends Archivist Meetings: Never  . Marital Status: Widowed   No Known Allergies Family History  Problem Relation Age of Onset  . Hypertension Other   . Stroke Other   . Heart disease Other        rheumatic   . Coronary artery disease Other      Current Outpatient Medications (Cardiovascular):  .  amLODipine (NORVASC) 5 MG tablet, Take 1 tablet (5 mg total) by mouth daily. Marland Kitchen  atorvastatin (LIPITOR) 20 MG tablet, Take 1 tablet (20 mg total) by mouth daily. .  hydrochlorothiazide (HYDRODIURIL) 25 MG tablet, Take 1 tablet (25 mg total) by mouth daily. Marland Kitchen  losartan (COZAAR) 100 MG tablet, Take 1 tablet (100 mg total) by mouth daily. .  sildenafil (VIAGRA) 100 MG tablet, Take 1 tablet (100 mg total) by mouth daily as needed for erectile dysfunction.     Current Outpatient Medications (Other):  Marland Kitchen  Coenzyme Q10 (CO Q-10) 100 MG CAPS, Take by mouth daily. .  diclofenac sodium (VOLTAREN) 1 % GEL, Apply topically to affected area qid .  finasteride (PROSCAR) 5 MG tablet, Take 1 tablet (5 mg total) by mouth daily. Marland Kitchen  glucosamine-chondroitin 500-400 MG tablet, Take 1 tablet by mouth daily. .  tamsulosin (FLOMAX) 0.4 MG CAPS capsule, Take 1 capsule (0.4 mg total) by mouth daily.   Reviewed prior external information including notes and imaging from  primary care provider As well as notes that were available from care everywhere and other healthcare systems.  Past medical history, social, surgical and family history all reviewed in electronic medical record.  No pertanent information unless stated regarding to the chief complaint.   Review of Systems:  No headache, visual changes, nausea, vomiting, diarrhea,  constipation, dizziness, abdominal pain, skin rash, fevers, chills, night sweats, weight loss, swollen lymph nodes, body aches, joint swelling, chest pain, shortness of breath, mood changes. POSITIVE muscle aches  Objective  Blood pressure 122/70, pulse 88, height 5\' 11"  (1.803 m), weight 245 lb (111.1 kg), SpO2 97 %.   General: No apparent distress alert and oriented x3 mood and affect normal, dressed appropriately.  HEENT: Pupils equal, extraocular movements intact  Respiratory: Patient's speak in full sentences and does not appear short of breath  Cardiovascular: No lower extremity edema, non tender, no erythema  Back exam shows the patient has loss of lordosis.  Poor core strength noted.  Patient does have mild tightness with straight leg test.  Worsening pain with extension of 10 degrees of the back.  Neurovascularly intact distally.  5 out of 5 strength to lower extremity Left knee exam mild tenderness to palpation over the medial joint space.  Valgus and varus force.  This.  Mild crepitus noted but full range of motion    Impression and Recommendations:     The above documentation has been reviewed and is accurate and complete Lyndal Pulley, DO

## 2020-07-26 ENCOUNTER — Encounter: Payer: Self-pay | Admitting: Physical Therapy

## 2020-07-26 ENCOUNTER — Other Ambulatory Visit: Payer: Self-pay

## 2020-07-26 ENCOUNTER — Ambulatory Visit: Payer: Medicare HMO | Attending: Emergency Medicine | Admitting: Physical Therapy

## 2020-07-26 DIAGNOSIS — R2689 Other abnormalities of gait and mobility: Secondary | ICD-10-CM | POA: Diagnosis not present

## 2020-07-26 DIAGNOSIS — M545 Low back pain, unspecified: Secondary | ICD-10-CM | POA: Insufficient documentation

## 2020-07-26 DIAGNOSIS — R293 Abnormal posture: Secondary | ICD-10-CM | POA: Diagnosis not present

## 2020-07-26 DIAGNOSIS — M25562 Pain in left knee: Secondary | ICD-10-CM | POA: Insufficient documentation

## 2020-07-26 DIAGNOSIS — M6281 Muscle weakness (generalized): Secondary | ICD-10-CM | POA: Diagnosis not present

## 2020-07-26 DIAGNOSIS — G8929 Other chronic pain: Secondary | ICD-10-CM | POA: Diagnosis not present

## 2020-07-26 NOTE — Therapy (Signed)
[facility] [address], [place], [place], [address] Phone: [phone]   Fax:  [phone]  Physical Therapy Evaluation  Patient Details  Name: Calvin [name] [name]: [id number] Date of Birth: 09-Sep-1945 Referring Provider (PT): Lyndal Pulley, DO   Encounter Date: 07/26/2020   PT End of Session - 07/26/20 1433    Visit Number 1    Date for PT Re-Evaluation 10/18/20    Authorization Type [employer] Medicare    Progress Note Due on Visit 10    PT Start Time 1445    PT Stop Time 1530    PT Time Calculation (min) 45 min    Activity Tolerance Patient tolerated treatment well    Behavior During Therapy [facility] for tasks assessed/performed           Past Medical History:  Diagnosis Date  . Benign prostatic hypertrophy   . ED (erectile dysfunction)   . Headache(784.0)   . Hypertension   . Hypogonadism male   . Nephrolithiasis    hx of had lithotripsy in 1-10.    Past Surgical History:  Procedure Laterality Date  . colonoscopy  03-28-10   per Dr. Fuller Plan, hyperplastic polyps, repeat in 10 yrs   . FOOT SURGERY    . LAPAROSCOPIC APPENDECTOMY  03/12/2012   Procedure: APPENDECTOMY LAPAROSCOPIC;  Surgeon: Harl Bowie, MD;  Location: [place];  Service: General;  Laterality: N/A;  . Levert Feinstein HEMORRHOIDECTOMY  04-14-10   per Dr. Johney Maine   . TONSILLECTOMY      There were no vitals filed for this visit.    Subjective Assessment - 07/26/20 1437    Subjective Pt referred to OPPT by Dr. Tamala Julian with history of chronic LBP and Lt knee pain.  Symptoms in low back have been present for years which used to be more intermittent but is now constant.  Knee pain has been present x 2 years which has been helped somewhat by viscosupplementation.    Pertinent History has had viscosupplementation to Lt knee    Limitations Standing;Walking;House hold activities    How long can you stand comfortably? 15 min    How long can you walk  comfortably? short community distances    Diagnostic tests Lt knee x-ray: OA throughout, greatest in patellofemoral compartment  lumbar x-ray: chronic T11 compression deformity, disc narrowing L4/5, spondylosis and facet hypertrophy L4-S1 bil    Patient Stated Goals reduce pain    Currently in Pain? Yes    Pain Score 8    with standing, doing dishes, immediate relief with sitting   Pain Location Back    Pain Orientation Right;Mid    Pain Descriptors / Indicators Burning    Pain Type Chronic pain    Pain Radiating Towards Rt > Lt side from midline, no leg pain    Pain Onset More than a month ago    Pain Frequency Intermittent    Aggravating Factors  stand and walk    Pain Relieving Factors immediate relief with sitting    Multiple Pain Sites Yes    Pain Score 1    Pain Location Knee    Pain Orientation Left;Anterior    Pain Type Chronic pain    Pain Onset More than a month ago    Pain Frequency Intermittent    Aggravating Factors  gym equipment exercises    Pain Relieving Factors viscosupplementation injections (had one 2 mos ago), still ongoing relief  [facility] PT Assessment - 07/26/20 0001      Assessment   Medical Diagnosis M54.50,G89.29 (ICD-10-CM) - Chronic low back pain, unspecified back pain laterality, unspecified whether sciatica present M25.562,G89.29 (ICD-10-CM) - Chronic pain of left knee    Referring Provider (PT) Lyndal Pulley, DO    Onset Date/Surgical Date --   years   Next MD Visit as needed    Prior Therapy no      Precautions   Precautions None      Restrictions   Weight Bearing Restrictions No      Balance Screen   Has the patient fallen in the past 6 months No      [place] residence    Living Arrangements Alone    Type of [place] to enter    Entrance Stairs-Number of Steps 3    Entrance Stairs-Rails Can reach both    [place] None       Prior Function   Level of [place] Retired    [employer] account manager 3x+/week      Cognition   Overall Cognitive Status Within Functional Limits for tasks assessed      Observation/Other Assessments   Focus on Therapeutic Outcomes (FOTO)  56% goal 64%      Functional Tests   Functional tests Single leg stance;Sit to Stand      Single Leg Stance   Comments SLS Rt and Lt < 3 sec      Sit to Stand   Comments Ind with light UE use      Posture/Postural Control   Posture/Postural Control Postural limitations    Postural Limitations Increased thoracic kyphosis;Decreased lumbar lordosis      ROM / Strength   AROM / PROM / Strength AROM;PROM;Strength      AROM   Overall AROM Comments limited thoracic rotation 75% bil    AROM Assessment Site Knee;Lumbar    Right/Left Knee Right;Left    Right Knee Extension 0    Right Knee Flexion 130    Left Knee Extension 0    Left Knee Flexion 130    Lumbar Flexion limited 30%    Lumbar Extension 5 deg    Lumbar - Right Side Bend 10 deg    Lumbar - Left Side Bend 10 deg    Lumbar - Right Rotation limited 75%    Lumbar - Left Rotation limited 75%      PROM   Overall PROM Comments bil hips limited ext -5 deg, IR/ER WFL      Strength   Overall Strength Comments LE strength 4+/5 bil with exception of hip ext and abd 4/5 bil      Flexibility   Soft Tissue Assessment /Muscle Length yes    Hamstrings limited 20% bil    Quadriceps limited 20% bil   hip flexors limited 30% bil   Piriformis limited 20% bil      Palpation   Patella mobility WNL bil    Spinal mobility poor thoracic rotation bil, moderate limitation in lower lumbar segmental flexion    Palpation comment mild tenderness Rt upper gluts, glut med      Ambulation/Gait   Ambulation/Gait Yes    Ambulation/Gait Assistance 7: Independent    Assistive device None    Gait Pattern Decreased step length - right;Decreased step length -  left;Right foot  flat;Left foot flat    Gait Comments slow pace, absent trunk rotation and arm swing                      Objective measurements completed on examination: See above findings.               PT Education - 07/26/20 1526    Education Details Access Code: [email or web address]    Person(s) Educated Patient    Methods Explanation;Demonstration;Handout    Comprehension Verbalized understanding;Returned demonstration            PT Short Term Goals - 07/26/20 1434      PT SHORT TERM GOAL #1   Title Pt will be ind with initial HEP    Status New    Target Date 08/23/20      PT SHORT TERM GOAL #2   Title Pt will be instructed on body mechanics and use of core for light standing tasks such as cooking to better support his spine    Time 3    Period Weeks    Status New    Target Date 08/16/20      PT SHORT TERM GOAL #3   Title Pt will achieve improved thoracic rotation to limited 50% vs 75% limited    Time 4    Period Weeks    Status New    Target Date 08/23/20      PT SHORT TERM GOAL #4   Title Pt will report at least 30% reduction in pain with 15 min standing tasks around the home.    Time 6    Period Weeks    Status New    Target Date 09/06/20      PT SHORT TERM GOAL #5   Title Pt will be able to demo SLS balance of at least 6 sec on each foot without UE support    Time 6    Period Weeks    Status New    Target Date 09/06/20             PT Long Term Goals - 07/26/20 1738      PT LONG TERM GOAL #1   Title Pt will achieve LE flexibility to [facility] to reduce undue strain on spine.    Time 12    Period Weeks    Status New    Target Date 10/18/20      PT LONG TERM GOAL #2   Title pt will improve FOTO score from 56% to >/= 64% to demo improved function    Time 12    Period Weeks    Status New    Target Date 10/18/20      PT LONG TERM GOAL #3   Title Pt will improve gait efficiency with improved step length, heel strike and trunk rotation  with arm swing and glut use.    Time 12    Period Weeks    Status New    Target Date 10/18/20      PT LONG TERM GOAL #4   Title Pt will be able to stand for at least 20 min with min pain to perform kitchen tasks such as cook and wash dishes.    Time 12    Period Weeks    Status New    Target Date 10/18/20      PT LONG TERM GOAL #5   Title Pt will be ind with advanced HEP and understand how to safely progress  Time 12    Period Weeks    Status New    Target Date 10/18/20                  Plan - 07/26/20 1725    Clinical Impression Statement Pt referred to PT with history of chronic LBP and Lt knee pain.  LBP is worse than knee pain.  Pt is limited in standing and walking to 15' before needing a seated break due to pain which reaches 8/10.  Sitting gives Pt immediate relief.  Lt knee pain is worse with using gym machines if he uses too much weight.  He is 2 mos out from latest knee injection which has helped minimize his pain in his knee.  Pt presents with limited spinal mobility and trunk ROM especially into bil rotation and flexion.  He ambulates without AD with slow gait, foot flat bil, shortened step length and lacks rotation and arm swing.  He has signif LE flexibility restrictions in bil hip flexors > quads, hamstrings and piriformis.  LE strength is 4+/5 throughout with exception of bil hip ext and abd 4/5.  Pt's knee ROM is symmetrical bil and ranges from 0-130 deg.  Patellar mobility is WNL.  There is no edema or tenderness in lumbar spine or Lt knee.  Pt uses [employer] 3-7 days a week and uses the circuit of machines with light weight as not to hurt his Lt knee.  PT gave LE stretches, sit to stand and LAQ for initial HEP.  Pt will benefit from skilled PT to address defecits in mobility, strength, stability and gait to improve pain and tolerance of daily activities.    Personal Factors and Comorbidities Age;Fitness;Time since onset of injury/illness/exacerbation     Examination-Activity Limitations Locomotion Level;Transfers;Stand;Bend;Lift;Stairs;Squat    Examination-Participation Restrictions Community Activity;Cleaning;Meal Prep;Shop    Stability/Clinical Decision Making Stable/Uncomplicated    Clinical Decision Making Low    Rehab Potential Good    PT Frequency 1x / week    PT Duration 12 weeks    PT Treatment/Interventions ADLs/Self Care Home Management;Cryotherapy;Moist Heat;Electrical Stimulation;Joint Manipulations;Spinal Manipulations;Dry needling;Passive range of motion;Patient/family education;Gait training;Therapeutic activities;Therapeutic exercise;Balance training;Neuromuscular re-education;Manual techniques;Taping    PT Next Visit Plan f/u on HEP, seated thoracic rotation, seated ball rollouts for trunk stretch, open books, NuStep, UBE, leg press, core, gait step length, rotation, arm swing, heel strike, SLS at counter    PT Home Exercise Plan Access Code: [email or web address]    Consulted and Agree with Plan of Care Patient           Patient will benefit from skilled therapeutic intervention in order to improve the following deficits and impairments:  Abnormal gait,Decreased range of motion,Difficulty walking,Pain,Postural dysfunction,Decreased strength,Decreased mobility,Hypomobility,Impaired flexibility,Improper body mechanics,Decreased activity tolerance,Decreased balance  Visit Diagnosis: Chronic midline low back pain without sciatica - Plan: PT plan of care cert/re-cert  Chronic pain of left knee - Plan: PT plan of care cert/re-cert  Abnormal posture - Plan: PT plan of care cert/re-cert  Other abnormalities of gait and mobility - Plan: PT plan of care cert/re-cert  Muscle weakness (generalized) - Plan: PT plan of care cert/re-cert     Problem List Patient Active Problem List   Diagnosis Date Noted  . Low back pain 06/09/2020  . COVID-19 virus infection 02/24/2020  . Sprain of index finger, initial encounter 04/23/2018  . Primary  osteoarthritis of left knee 04/23/2018  . Urinary tract infection, site not specified 01/30/2016  . Orchitis 01/30/2016  . Epididymitis 01/19/2016  .  Hypogonadism in male 01/19/2016  . Foot swelling 01/19/2016  . Testicular pain, right   . Depression 12/08/2014  . Insomnia 10/30/2013  . INTERNAL HEMORRHOIDS WITH OTHER COMPLICATION 72/76/1848  . BPH (benign prostatic hyperplasia) 10/25/2008  . NEPHROLITHIASIS, HX OF 10/25/2008  . Headache 09/15/2007  . Essential hypertension 07/07/2007    Alene Mires Carlisia Geno 07/26/2020, 5:43 PM  [facility] [address] [place], [place], [address] Phone: [phone]   Fax:  [phone]  Name: AVEER BARTOW [name]: [id number] Date of Birth: Jun 09, 1946

## 2020-07-26 NOTE — Patient Instructions (Signed)
Access Code: [email or web address] URL: [email or web address] Date: 07/26/2020 Prepared by: Venetia Night Gwendolynn Merkey  Exercises Hooklying Single Knee to Chest Stretch - 1 x daily - 7 x weekly - 1 sets - 10 reps - 5 hold Supine Lower Trunk Rotation - 1 x daily - 7 x weekly - 1 sets - 10 reps - 5 hold Seated Hamstring Stretch - 1 x daily - 7 x weekly - 1 sets - 3 reps - 30 hold Seated Hip Flexor Stretch - 1 x daily - 7 x weekly - 1 sets - 2 reps - 30 hold Seated Long Arc Quad - 1 x daily - 7 x weekly - 2 sets - 10 reps Sit to Stand with Hands on Knees - 1 x daily - 7 x weekly - 2 sets - 5 reps

## 2020-07-28 ENCOUNTER — Telehealth: Payer: Self-pay | Admitting: *Deleted

## 2020-07-28 NOTE — Telephone Encounter (Signed)
Patient no showed PV today- Called patient and left message to return call by 5 pm today- If no call by 5 pm, PV and procedure will be canceled - no call at 5 pm -  PV and Procedure both canceled- No Show letter mailed to patient  

## 2020-07-29 ENCOUNTER — Encounter: Payer: Self-pay | Admitting: Gastroenterology

## 2020-08-02 ENCOUNTER — Other Ambulatory Visit: Payer: Self-pay

## 2020-08-02 ENCOUNTER — Encounter: Payer: Self-pay | Admitting: Physical Therapy

## 2020-08-02 ENCOUNTER — Ambulatory Visit: Payer: Medicare HMO | Admitting: Physical Therapy

## 2020-08-02 DIAGNOSIS — M6281 Muscle weakness (generalized): Secondary | ICD-10-CM | POA: Diagnosis not present

## 2020-08-02 DIAGNOSIS — R293 Abnormal posture: Secondary | ICD-10-CM

## 2020-08-02 DIAGNOSIS — R2689 Other abnormalities of gait and mobility: Secondary | ICD-10-CM

## 2020-08-02 DIAGNOSIS — M545 Low back pain, unspecified: Secondary | ICD-10-CM | POA: Diagnosis not present

## 2020-08-02 DIAGNOSIS — G8929 Other chronic pain: Secondary | ICD-10-CM | POA: Diagnosis not present

## 2020-08-02 DIAGNOSIS — M25562 Pain in left knee: Secondary | ICD-10-CM | POA: Diagnosis not present

## 2020-08-02 NOTE — Therapy (Signed)
[facility] [address] [place], [place], [address] Phone: [phone]   Fax:  [phone]  Physical Therapy Treatment  Patient Details  Name: Calvin [name] [name]: [id number] Date of Birth: 07-19-1945 Referring Provider (PT): Lyndal Pulley, DO   Encounter Date: 08/02/2020   PT End of Session - 08/02/20 1008    Visit Number 2    Date for PT Re-Evaluation 10/18/20    Authorization Type [employer]na Medicare    Progress Note Due on Visit 10    PT Start Time 1008    PT Stop Time 1050    PT Time Calculation (min) 42 min    Activity Tolerance Patient tolerated treatment well    Behavior During Therapy [facility] for tasks assessed/performed           Past Medical History:  Diagnosis Date  . Benign prostatic hypertrophy   . ED (erectile dysfunction)   . Headache(784.0)   . Hypertension   . Hypogonadism male   . Nephrolithiasis    hx of had lithotripsy in 1-10.    Past Surgical History:  Procedure Laterality Date  . colonoscopy  03-28-10   per Dr. Fuller Plan, hyperplastic polyps, repeat in 10 yrs   . FOOT SURGERY    . LAPAROSCOPIC APPENDECTOMY  03/12/2012   Procedure: APPENDECTOMY LAPAROSCOPIC;  Surgeon: Harl Bowie, MD;  Location: [facility];  Service: General;  Laterality: N/A;  . Levert Feinstein HEMORRHOIDECTOMY  04-14-10   per Dr. Johney Maine   . TONSILLECTOMY      There were no vitals filed for this visit.   Subjective Assessment - 08/02/20 1009    Subjective Doing the HEP daily.  Going to [employer] from here.  My back is bothering me a bit today, maybe a 6/10.    Pertinent History has had viscosupplementation to Lt knee    Limitations Standing;Walking;House hold activities    How long can you stand comfortably? 15 min    How long can you walk comfortably? short community distances    Diagnostic tests Lt knee x-ray: OA throughout, greatest in patellofemoral compartment  lumbar x-ray: chronic T11 compression deformity, disc  narrowing L4/5, spondylosis and facet hypertrophy L4-S1 bil    Patient Stated Goals reduce pain    Currently in Pain? Yes    Pain Score 6     Pain Location Back    Pain Orientation Right;Mid    Pain Descriptors / Indicators Burning    Pain Type Chronic pain    Pain Radiating Towards Rt>Lt side from midline    Pain Onset More than a month ago    Pain Frequency Intermittent    Aggravating Factors  stand, walk    Pain Relieving Factors sitting - immediate relief                             [facility] Adult PT Treatment/Exercise - 08/02/20 0001      Self-Care   Self-Care Other Self-Care Comments    Other Self-Care Comments  role of core and how to identify deep abdominals      Neuro Re-ed    Neuro Re-ed Details  TA intro in SL 5 reps hold x 3 breaths hold time      Exercises   Exercises Shoulder;Knee/Hip;Lumbar      Lumbar Exercises: Stretches   Active Hamstring Stretch 1 rep;30 seconds    Active Hamstring Stretch Limitations supine iwith strap  Single Knee to Chest Stretch 3 reps    Single Knee to Chest Stretch Limitations 5 sec hold    Lower Trunk Rotation 2 reps;10 seconds    Lower Trunk Rotation Limitations bil    Hip Flexor Stretch 5 reps    Hip Flexor Stretch Limitations foot on 2nd step, dynamic vs hold    Other Lumbar Stretch Exercise seated ball rollouts for lumbar flexion and flex/SB combo bil 5x5" holds each way      Lumbar Exercises: Aerobic   Nustep seat 10 L2 x 6' PT present to discuss status and pain level      Lumbar Exercises: Supine   Bridge 10 reps;Compliant    Bridge Limitations articulating bridge    Straight Leg Raise 10 reps    Straight Leg Raises Limitations VC for exhale on effort and TC for TA    Other Supine Lumbar Exercises feet on red ball roll in/out with TA x15 reps      Lumbar Exercises: Sidelying   Other Sidelying Lumbar Exercises open books bil x 10      Knee/Hip Exercises: Machines for Strengthening   Total Gym Leg  Press bil LEs 100lb x 20, then 140lb x 20      Shoulder Exercises: Supine   Horizontal ABduction Strengthening;Both;Theraband;15 reps    Theraband Level (Shoulder Horizontal ABduction) Level 3 (Green)    Horizontal ABduction Limitations feet on red ball for core challenge                    PT Short Term Goals - 08/02/20 1008      PT SHORT TERM GOAL #1   Title Pt will be ind with initial HEP    Status On-going             PT Long Term Goals - 07/26/20 1738      PT LONG TERM GOAL #1   Title Pt will achieve LE flexibility to [facility] to reduce undue strain on spine.    Time 12    Period Weeks    Status New    Target Date 10/18/20      PT LONG TERM GOAL #2   Title pt will improve FOTO score from 56% to >/= 64% to demo improved function    Time 12    Period Weeks    Status New    Target Date 10/18/20      PT LONG TERM GOAL #3   Title Pt will improve gait efficiency with improved step length, heel strike and trunk rotation with arm swing and glut use.    Time 12    Period Weeks    Status New    Target Date 10/18/20      PT LONG TERM GOAL #4   Title Pt will be able to stand for at least 20 min with min pain to perform kitchen tasks such as cook and wash dishes.    Time 12    Period Weeks    Status New    Target Date 10/18/20      PT LONG TERM GOAL #5   Title Pt will be ind with advanced HEP and understand how to safely progress    Time 12    Period Weeks    Status New    Target Date 10/18/20                 Plan - 08/02/20 1008    Clinical Impression Statement Pt returns to  clinic for first follow up since evaluation.  He arrived with Rt LBP rated 6/10.  PT introduced beginner core activation with some light layered UE/LE strength and mobility overlay with TA activation.  Pt needed cueing for breath management for exhale on effort which improved his core activation.  PT introduced some spinal mobility and stretching beyond initial HEP.  Pt reported  0/10 pain end of session suggesting good response to selected interventions today.  PT will update HEP to reflect today's session next visit if Pt's relief lasts.  Continue along POC with ongoing assessment.    PT Frequency 1x / week    PT Duration 12 weeks    PT Treatment/Interventions ADLs/Self Care Home Management;Cryotherapy;Moist Heat;Electrical Stimulation;Joint Manipulations;Spinal Manipulations;Dry needling;Passive range of motion;Patient/family education;Gait training;Therapeutic activities;Therapeutic exercise;Balance training;Neuromuscular re-education;Manual techniques;Taping    PT Next Visit Plan revisit spinal mobility, stretches, core from today's session and update HEP    PT Home Exercise Plan Access Code: [email or web address]    Consulted and Agree with Plan of Care Patient           Patient will benefit from skilled therapeutic intervention in order to improve the following deficits and impairments:     Visit Diagnosis: Chronic midline low back pain without sciatica  Chronic pain of left knee  Abnormal posture  Other abnormalities of gait and mobility  Muscle weakness (generalized)     Problem List Patient Active Problem List   Diagnosis Date Noted  . Low back pain 06/09/2020  . COVID-19 virus infection 02/24/2020  . Sprain of index finger, initial encounter 04/23/2018  . Primary osteoarthritis of left knee 04/23/2018  . Urinary tract infection, site not specified 01/30/2016  . Orchitis 01/30/2016  . Epididymitis 01/19/2016  . Hypogonadism in male 01/19/2016  . Foot swelling 01/19/2016  . Testicular pain, right   . Depression 12/08/2014  . Insomnia 10/30/2013  . INTERNAL HEMORRHOIDS WITH OTHER COMPLICATION 27/12/8673  . BPH (benign prostatic hyperplasia) 10/25/2008  . NEPHROLITHIASIS, HX OF 10/25/2008  . Headache 09/15/2007  . Essential hypertension 07/07/2007    Alene Mires [place] 08/02/2020, 10:55 AM  [facility] [address] [place], [place], [address] Phone: [phone]   Fax:  [phone]  Name: Calvin [name] [name]: [id number] Date of Birth: 1946/05/13

## 2020-08-11 ENCOUNTER — Encounter: Payer: Medicare HMO | Admitting: Gastroenterology

## 2020-08-16 ENCOUNTER — Encounter: Payer: Medicare HMO | Admitting: Physical Therapy

## 2020-08-23 ENCOUNTER — Other Ambulatory Visit: Payer: Self-pay

## 2020-08-23 ENCOUNTER — Encounter: Payer: Self-pay | Admitting: Physical Therapy

## 2020-08-23 ENCOUNTER — Ambulatory Visit: Payer: Medicare HMO | Attending: Emergency Medicine | Admitting: Physical Therapy

## 2020-08-23 DIAGNOSIS — M6281 Muscle weakness (generalized): Secondary | ICD-10-CM | POA: Diagnosis not present

## 2020-08-23 DIAGNOSIS — G8929 Other chronic pain: Secondary | ICD-10-CM | POA: Diagnosis not present

## 2020-08-23 DIAGNOSIS — R293 Abnormal posture: Secondary | ICD-10-CM | POA: Insufficient documentation

## 2020-08-23 DIAGNOSIS — M25562 Pain in left knee: Secondary | ICD-10-CM | POA: Diagnosis not present

## 2020-08-23 DIAGNOSIS — R2689 Other abnormalities of gait and mobility: Secondary | ICD-10-CM | POA: Diagnosis not present

## 2020-08-23 DIAGNOSIS — M545 Low back pain, unspecified: Secondary | ICD-10-CM | POA: Insufficient documentation

## 2020-08-23 NOTE — Therapy (Addendum)
[facility] [address], [place], [place], [address] Phone: [phone]   Fax:  [phone]  Physical Therapy Treatment  Patient Details  Name: Calvin [name] [name]: [id number] Date of Birth: February 27, 1946 Referring Provider (PT): Lyndal Pulley, DO   Encounter Date: 08/23/2020   PT End of Session - 08/23/20 1410    Visit Number 3    Date for PT Re-Evaluation 10/18/20    Authorization Type [employer] Medicare    Progress Note Due on Visit 10    PT Start Time 1405    PT Stop Time 1445    PT Time Calculation (min) 40 min    Activity Tolerance Patient tolerated treatment well    Behavior During Therapy [facility] for tasks assessed/performed           Past Medical History:  Diagnosis Date  . Benign prostatic hypertrophy   . ED (erectile dysfunction)   . Headache(784.0)   . Hypertension   . Hypogonadism male   . Nephrolithiasis    hx of had lithotripsy in 1-10.    Past Surgical History:  Procedure Laterality Date  . colonoscopy  03-28-10   per Dr. Fuller Plan, hyperplastic polyps, repeat in 10 yrs   . FOOT SURGERY    . LAPAROSCOPIC APPENDECTOMY  03/12/2012   Procedure: APPENDECTOMY LAPAROSCOPIC;  Surgeon: Harl Bowie, MD;  Location: [facility];  Service: General;  Laterality: N/A;  . Levert Feinstein HEMORRHOIDECTOMY  04-14-10   per Dr. Johney Maine   . TONSILLECTOMY      There were no vitals filed for this visit.   Subjective Assessment - 08/23/20 1407    Subjective Patient reports that knee feels "fine". Has 2/10 pain in R hip.    Pertinent History has had viscosupplementation to Lt knee    Limitations Standing;Walking;House hold activities    How long can you stand comfortably? 15 min    How long can you walk comfortably? short community distances    Diagnostic tests Lt knee x-ray: OA throughout, greatest in patellofemoral compartment  lumbar x-ray: chronic T11 compression deformity, disc narrowing L4/5, spondylosis and facet  hypertrophy L4-S1 bil    Patient Stated Goals reduce pain    Currently in Pain? Yes    Pain Score 2     Pain Location Hip    Pain Orientation Right    Pain Descriptors / Indicators Aching    Pain Frequency Intermittent                             [facility] Adult PT Treatment/Exercise - 08/23/20 0001      Neuro Re-ed    Neuro Re-ed Details  TA intro in SL 3 x 5s hold   max cuing for proper form and muscle contraction     Lumbar Exercises: Stretches   Active Hamstring Stretch Right;Left;2 reps;10 seconds    Active Hamstring Stretch Limitations in sitting    Lower Trunk Rotation 2 reps;10 seconds    Lower Trunk Rotation Limitations BIL    Hip Flexor Stretch Right;Left;2 reps;10 seconds    Hip Flexor Stretch Limitations in sitting    Standing Side Bend Right;Left   x10 each side   Other Lumbar Stretch Exercise seated ball rollouts for lumbar flexion and flex/SB combo bil 5x5" holds each way   VC for form   Other Lumbar Stretch Exercise seated trunk rotation 2 x 10s hold each side   VC and demonstration  for form     Lumbar Exercises: Aerobic   Nustep seat 10 L3 x 6' PT present to discuss current functional level and patient progression      Lumbar Exercises: Supine   Bridge 10 reps;Compliant    Bridge Limitations articulating bridge    Straight Leg Raise 10 reps   BIL LE   Straight Leg Raises Limitations VC for exhale on effort and maintaining TA      Lumbar Exercises: Sidelying   Clam Both    Clam Limitations 2 x 3 reps with TA contraction    Other Sidelying Lumbar Exercises open books bil x 5                  PT Education - 08/23/20 1444    Education Details Access Code: [email or web address]; added exercises to HEP    Person(s) Educated Patient    Methods Explanation;Demonstration    Comprehension Verbalized understanding            PT Short Term Goals - 08/02/20 1008      PT SHORT TERM GOAL #1   Title Pt will be ind with initial HEP    Status On-going              PT Long Term Goals - 07/26/20 1738      PT LONG TERM GOAL #1   Title Pt will achieve LE flexibility to [facility] to reduce undue strain on spine.    Time 12    Period Weeks    Status New    Target Date 10/18/20      PT LONG TERM GOAL #2   Title pt will improve FOTO score from 56% to >/= 64% to demo improved function    Time 12    Period Weeks    Status New    Target Date 10/18/20      PT LONG TERM GOAL #3   Title Pt will improve gait efficiency with improved step length, heel strike and trunk rotation with arm swing and glut use.    Time 12    Period Weeks    Status New    Target Date 10/18/20      PT LONG TERM GOAL #4   Title Pt will be able to stand for at least 20 min with min pain to perform kitchen tasks such as cook and wash dishes.    Time 12    Period Weeks    Status New    Target Date 10/18/20      PT LONG TERM GOAL #5   Title Pt will be ind with advanced HEP and understand how to safely progress    Time 12    Period Weeks    Status New    Target Date 10/18/20                 Plan - 08/23/20 1411    Clinical Impression Statement Patient returns after having flown to [place] to visit family. Reports that he handled travelling well with regards to pain but did not enjoy the flight experience. Reports pain to be 2/10 today in R hip. States that he has no knee pain. Pain continues to present intermittently with no known aggrivating factors. Noting improved quality of movement when performing open book exercise. Patient continues to require heavy cuing to facilitate appropriate TA contraction without use of valsalva manuever. Would benefit from continued skilled intervention to address impairments for decreased pain and improved activity tolerance.  Personal Factors and Comorbidities Age;Fitness;Time since onset of injury/illness/exacerbation    Examination-Activity Limitations Locomotion Level;Transfers;Stand;Bend;Lift;Stairs;Squat     Examination-Participation Restrictions Community Activity;Cleaning;Meal Prep;Shop    Rehab Potential Good    PT Frequency 1x / week    PT Duration 12 weeks    PT Treatment/Interventions ADLs/Self Care Home Management;Cryotherapy;Moist Heat;Electrical Stimulation;Joint Manipulations;Spinal Manipulations;Dry needling;Passive range of motion;Patient/family education;Gait training;Therapeutic activities;Therapeutic exercise;Balance training;Neuromuscular re-education;Manual techniques;Taping    PT Next Visit Plan revisit exercises from today's session adding progession as needed    PT Home Exercise Plan Access Code: [email or web address]    Consulted and Agree with Plan of Care Patient           Patient will benefit from skilled therapeutic intervention in order to improve the following deficits and impairments:  Abnormal gait,Decreased range of motion,Difficulty walking,Pain,Postural dysfunction,Decreased strength,Decreased mobility,Hypomobility,Impaired flexibility,Improper body mechanics,Decreased activity tolerance,Decreased balance  Visit Diagnosis: Chronic midline low back pain without sciatica  Chronic pain of left knee  Abnormal posture  Other abnormalities of gait and mobility  Muscle weakness (generalized)     Problem List Patient Active Problem List   Diagnosis Date Noted  . Low back pain 06/09/2020  . COVID-19 virus infection 02/24/2020  . Sprain of index finger, initial encounter 04/23/2018  . Primary osteoarthritis of left knee 04/23/2018  . Urinary tract infection, site not specified 01/30/2016  . Orchitis 01/30/2016  . Epididymitis 01/19/2016  . Hypogonadism in male 01/19/2016  . Foot swelling 01/19/2016  . Testicular pain, right   . Depression 12/08/2014  . Insomnia 10/30/2013  . INTERNAL HEMORRHOIDS WITH OTHER COMPLICATION 91/91/6606  . BPH (benign prostatic hyperplasia) 10/25/2008  . NEPHROLITHIASIS, HX OF 10/25/2008  . Headache 09/15/2007  . Essential hypertension  07/07/2007   Everardo All, PT 08/23/20 4:24 PM  Baruch Merl, PT 08/23/20 5:06 PM    [facility] [address], [place], [place], [address] Phone: [phone]   Fax:  [phone]  Name: Calvin [name] [name]: [id number] Date of Birth: [birth year]

## 2020-08-30 ENCOUNTER — Encounter: Payer: Self-pay | Admitting: Physical Therapy

## 2020-08-30 ENCOUNTER — Other Ambulatory Visit: Payer: Self-pay

## 2020-08-30 ENCOUNTER — Ambulatory Visit: Payer: Medicare HMO | Admitting: Physical Therapy

## 2020-08-30 DIAGNOSIS — M25562 Pain in left knee: Secondary | ICD-10-CM | POA: Diagnosis not present

## 2020-08-30 DIAGNOSIS — G8929 Other chronic pain: Secondary | ICD-10-CM

## 2020-08-30 DIAGNOSIS — R293 Abnormal posture: Secondary | ICD-10-CM | POA: Diagnosis not present

## 2020-08-30 DIAGNOSIS — M545 Low back pain, unspecified: Secondary | ICD-10-CM | POA: Diagnosis not present

## 2020-08-30 DIAGNOSIS — R2689 Other abnormalities of gait and mobility: Secondary | ICD-10-CM

## 2020-08-30 DIAGNOSIS — M6281 Muscle weakness (generalized): Secondary | ICD-10-CM

## 2020-08-30 NOTE — Therapy (Signed)
[facility] [address], [place], [place], [address] Phone: [phone]   Fax:  [phone]  Physical Therapy Treatment  Patient Details  Name: Calvin [name] [name]: [id number] Date of Birth: 05/29/1946 Referring Provider (PT): Lyndal Pulley, DO   Encounter Date: 08/30/2020   PT End of Session - 08/30/20 1358    Visit Number 4    Date for PT Re-Evaluation 10/18/20    Authorization Type [employer] Medicare    Progress Note Due on Visit 10    PT Start Time 1400    PT Stop Time 1442    PT Time Calculation (min) 42 min    Activity Tolerance Patient tolerated treatment well    Behavior During Therapy [facility] for tasks assessed/performed           Past Medical History:  Diagnosis Date  . Benign prostatic hypertrophy   . ED (erectile dysfunction)   . Headache(784.0)   . Hypertension   . Hypogonadism male   . Nephrolithiasis    hx of had lithotripsy in 1-10.    Past Surgical History:  Procedure Laterality Date  . colonoscopy  03-28-10   per Dr. Fuller Plan, hyperplastic polyps, repeat in 10 yrs   . FOOT SURGERY    . LAPAROSCOPIC APPENDECTOMY  03/12/2012   Procedure: APPENDECTOMY LAPAROSCOPIC;  Surgeon: Harl Bowie, MD;  Location: [facility];  Service: General;  Laterality: N/A;  . Levert Feinstein HEMORRHOIDECTOMY  04-14-10   per Dr. Johney Maine   . TONSILLECTOMY      There were no vitals filed for this visit.   Subjective Assessment - 08/30/20 1359    Subjective I am not having a whole lot of pain.  My back bothers me sometimes but not my knee.  I need to travel to [facility] next week to help my daugther.  I still go to they gym every day.    Pertinent History has had viscosupplementation to Lt knee    Limitations Standing;Walking;House hold activities    How long can you stand comfortably? 15 min    How long can you walk comfortably? short community distances    Diagnostic tests Lt knee x-ray: OA throughout, greatest in patellofemoral  compartment  lumbar x-ray: chronic T11 compression deformity, disc narrowing L4/5, spondylosis and facet hypertrophy L4-S1 bil    Patient Stated Goals reduce pain    Currently in Pain? No/denies    Aggravating Factors  standing activities like washing dishes, walk    Pain Relieving Factors sitting - immediate relief                             [facility] Adult PT Treatment/Exercise - 08/30/20 0001      Exercises   Exercises Shoulder;Knee/Hip;Lumbar      Lumbar Exercises: Stretches   Active Hamstring Stretch Right;Left;1 rep;20 seconds    Active Hamstring Stretch Limitations in sitting    Other Lumbar Stretch Exercise seated ball rollouts for lumbar flexion and flex/SB combo bil x 4 reps each way   VC for form   Other Lumbar Stretch Exercise seated trunk rotation 1x10"      Lumbar Exercises: Aerobic   Nustep L2 x 5' PT present to discuss status      Lumbar Exercises: Standing   Other Standing Lumbar Exercises red tband pullbacks/forwards 10x3" each for standing core recruitment    Other Standing Lumbar Exercises 10lb kettlebell deadlifts to low mat table x 10  reps      Lumbar Exercises: Supine   Bridge 10 reps    Bridge Limitations added blue loop band abd with bridge, VC for TA vs doming      Lumbar Exercises: Sidelying   Clam Both;10 reps    Clam Limitations with TA and blue loop band around knees      Lumbar Exercises: Prone   Other Prone Lumbar Exercises counter plank 3x15 sec      Lumbar Exercises: Quadruped   Madcat/Old Horse 10 reps    Madcat/Old Horse Limitations VC for thoracic ext vs lumbar    Plank 1x20"                    PT Short Term Goals - 08/02/20 1008      PT SHORT TERM GOAL #1   Title Pt will be ind with initial HEP    Status On-going             PT Long Term Goals - 07/26/20 1738      PT LONG TERM GOAL #1   Title Pt will achieve LE flexibility to [facility] to reduce undue strain on spine.    Time 12    Period Weeks     Status New    Target Date 10/18/20      PT LONG TERM GOAL #2   Title pt will improve FOTO score from 56% to >/= 64% to demo improved function    Time 12    Period Weeks    Status New    Target Date 10/18/20      PT LONG TERM GOAL #3   Title Pt will improve gait efficiency with improved step length, heel strike and trunk rotation with arm swing and glut use.    Time 12    Period Weeks    Status New    Target Date 10/18/20      PT LONG TERM GOAL #4   Title Pt will be able to stand for at least 20 min with min pain to perform kitchen tasks such as cook and wash dishes.    Time 12    Period Weeks    Status New    Target Date 10/18/20      PT LONG TERM GOAL #5   Title Pt will be ind with advanced HEP and understand how to safely progress    Time 12    Period Weeks    Status New    Target Date 10/18/20                 Plan - 08/30/20 1443    Clinical Impression Statement Pt arrives with report of much improved knee pain and low-grade intermittent LBP mostly when standing for kitchen tasks.  He reports HEP stretching helps as does sitting x 10' when symptoms develop.  PT progressed resistance band with bridges and clams today with good tolerance so added to HEP.  PT also introduced core challenges in quadruped and counter plank with improving awareness of TA.  Pt with good tolerance of modified dead lifts and tband pullbacks/forwards for standing core activation.  PT emphasized concentration on these muscles with standing tasks to better support his trunk with standing kitchen tasks.  Continue along POC with ongoing assessment.    PT Frequency 1x / week    PT Duration 12 weeks    PT Treatment/Interventions ADLs/Self Care Home Management;Cryotherapy;Moist Heat;Electrical Stimulation;Joint Manipulations;Spinal Manipulations;Dry needling;Passive range of motion;Patient/family education;Gait training;Therapeutic activities;Therapeutic  exercise;Balance training;Neuromuscular  re-education;Manual techniques;Taping    PT Next Visit Plan continue progression of core and LE/scapular strength functional overlay    PT Home Exercise Plan Access Code: [email or web address]    Consulted and Agree with Plan of Care Patient           Patient will benefit from skilled therapeutic intervention in order to improve the following deficits and impairments:     Visit Diagnosis: Chronic midline low back pain without sciatica  Chronic pain of left knee  Abnormal posture  Other abnormalities of gait and mobility  Muscle weakness (generalized)     Problem List Patient Active Problem List   Diagnosis Date Noted  . Low back pain 06/09/2020  . COVID-19 virus infection 02/24/2020  . Sprain of index finger, initial encounter 04/23/2018  . Primary osteoarthritis of left knee 04/23/2018  . Urinary tract infection, site not specified 01/30/2016  . Orchitis 01/30/2016  . Epididymitis 01/19/2016  . Hypogonadism in male 01/19/2016  . Foot swelling 01/19/2016  . Testicular pain, right   . Depression 12/08/2014  . Insomnia 10/30/2013  . INTERNAL HEMORRHOIDS WITH OTHER COMPLICATION 85/27/7824  . BPH (benign prostatic hyperplasia) 10/25/2008  . NEPHROLITHIASIS, HX OF 10/25/2008  . Headache 09/15/2007  . Essential hypertension 07/07/2007    Baruch Merl, PT 08/30/20 2:46 PM   [facility] [address] [place], [place], [address] Phone: [phone]   Fax:  [phone]  Name: Calvin [name] [name]: [id number] Date of Birth: Dec 17, 1945

## 2020-09-06 ENCOUNTER — Telehealth: Payer: Self-pay | Admitting: Physical Therapy

## 2020-09-06 ENCOUNTER — Ambulatory Visit: Payer: Medicare HMO | Admitting: Physical Therapy

## 2020-09-06 NOTE — Telephone Encounter (Signed)
Pt missed PT appointment today, 09/06/20.  PT called and spoke to Pt who meant to call to cancel as he is still out of town.  He confirmed he should be back for next appointment on 09/13/20.  Sarajean Dessert, PT 09/06/20 2:25 PM

## 2020-09-12 ENCOUNTER — Telehealth: Payer: Self-pay | Admitting: Pharmacist

## 2020-09-12 NOTE — Chronic Care Management (AMB) (Signed)
    Chronic Care Management Pharmacy Assistant   Name: Calvin [name]  [name]: [id number] DOB: [birth year]  Reason for Encounter: Disease State   Recent office visits:  10.25.2021 Medicare Wellness Exam  Recent consult visits:  01.13.2022 [facility] 12.23.2021[facility] visits:  None in previous 6 months  Medications: Outpatient Encounter Medications as of 09/12/2020  Medication Sig  . amLODipine (NORVASC) 5 MG tablet Take 1 tablet (5 mg total) by mouth daily.  Marland Kitchen atorvastatin (LIPITOR) 20 MG tablet Take 1 tablet (20 mg total) by mouth daily.  . Coenzyme Q10 (CO Q-10) 100 MG CAPS Take by mouth daily.  . diclofenac sodium (VOLTAREN) 1 % GEL Apply topically to affected area qid  . finasteride (PROSCAR) 5 MG tablet Take 1 tablet (5 mg total) by mouth daily.  Marland Kitchen glucosamine-chondroitin 500-400 MG tablet Take 1 tablet by mouth daily.  . hydrochlorothiazide (HYDRODIURIL) 25 MG tablet Take 1 tablet (25 mg total) by mouth daily.  Marland Kitchen losartan (COZAAR) 100 MG tablet Take 1 tablet (100 mg total) by mouth daily.  . sildenafil (VIAGRA) 100 MG tablet Take 1 tablet (100 mg total) by mouth daily as needed for erectile dysfunction.  . tamsulosin (FLOMAX) 0.4 MG CAPS capsule Take 1 capsule (0.4 mg total) by mouth daily.   No facility-administered encounter medications on file as of 09/12/2020.   I spoke with the patient and discussed medication adherence. He does have no issue currently with his current medication. He stated that he has not been taking his blood pressure at home since his last doctor's visit. He was encouraged to take his blood pressure at home at least once a week. He understood. He denies any ED visits since his last CPP follow-up. He continues to go to physical therapy. There have been no changes to his medications. He did mention that he was having some left side pain for a couple of days. An appointment was scheduled with his  PCP. The patient denies any side effects from his current medications. He also denies any problems with his current pharmacy.  Star Rating Drugs:  [name], [name] [phone]

## 2020-09-13 ENCOUNTER — Ambulatory Visit: Payer: Medicare HMO | Admitting: Physical Therapy

## 2020-09-14 ENCOUNTER — Encounter: Payer: Self-pay | Admitting: Family Medicine

## 2020-09-14 ENCOUNTER — Other Ambulatory Visit: Payer: Self-pay

## 2020-09-14 ENCOUNTER — Ambulatory Visit (INDEPENDENT_AMBULATORY_CARE_PROVIDER_SITE_OTHER): Payer: Medicare HMO | Admitting: Family Medicine

## 2020-09-14 VITALS — BP 120/80 | HR 96 | Temp 98.6°F | Wt 241.0 lb

## 2020-09-14 DIAGNOSIS — R109 Unspecified abdominal pain: Secondary | ICD-10-CM | POA: Diagnosis not present

## 2020-09-14 DIAGNOSIS — R10A2 Flank pain, left side: Secondary | ICD-10-CM

## 2020-09-14 LAB — POC URINALSYSI DIPSTICK (AUTOMATED)
Bilirubin, UA: NEGATIVE
Blood, UA: NEGATIVE
Glucose, UA: NEGATIVE
Ketones, UA: NEGATIVE
Leukocytes, UA: NEGATIVE
Nitrite, UA: NEGATIVE
Protein, UA: POSITIVE — AB
Spec Grav, UA: 1.015 (ref 1.010–1.025)
Urobilinogen, UA: 1 E.U./dL
pH, UA: 6 (ref 5.0–8.0)

## 2020-09-14 NOTE — Progress Notes (Signed)
   Subjective:    Patient ID: Calvin [name], male    DOB: Jul 03, 1945, 75 y.o.   [name]: [id number]  HPI Here for 2 weeks of intermittent sharp "stabbing" pains in the left flank. These were more intense at the beginning, but they eased up this past week and now he has been completely pain free for several days. No fever or nausea. No urinary urgency or burning. No blood in the urine. His BMs are regular. He has a hx of kidney stones a few years ago.    Review of Systems  Constitutional: Negative.   Respiratory: Negative.   Cardiovascular: Negative.   Gastrointestinal: Negative.   Genitourinary: Positive for flank pain. Negative for difficulty urinating, dysuria, frequency, hematuria, testicular pain and urgency.       Objective:   Physical Exam Constitutional:      Appearance: Normal appearance. He is not ill-appearing.  Cardiovascular:     Rate and Rhythm: Normal rate and regular rhythm.     Pulses: Normal pulses.     Heart sounds: Normal heart sounds.  Pulmonary:     Effort: Pulmonary effort is normal.     Breath sounds: Normal breath sounds.  Abdominal:     General: Abdomen is flat. Bowel sounds are normal. There is no distension.     Palpations: Abdomen is soft. There is no mass.     Tenderness: There is no abdominal tenderness. There is no right CVA tenderness, left CVA tenderness, guarding or rebound.     Hernia: No hernia is present.  Neurological:     Mental Status: He is alert.           Assessment & Plan:  His pain has resolved and I think he most likely has passed another kidney stone. He is already on Flomax. He will drink plenty of water and let us know if this pain returns.  Alysia Penna, MD

## 2020-09-15 ENCOUNTER — Other Ambulatory Visit: Payer: Self-pay

## 2020-09-15 ENCOUNTER — Ambulatory Visit: Payer: Medicare HMO | Admitting: Physical Therapy

## 2020-09-15 ENCOUNTER — Encounter: Payer: Self-pay | Admitting: Physical Therapy

## 2020-09-15 DIAGNOSIS — M545 Low back pain, unspecified: Secondary | ICD-10-CM

## 2020-09-15 DIAGNOSIS — M25562 Pain in left knee: Secondary | ICD-10-CM | POA: Diagnosis not present

## 2020-09-15 DIAGNOSIS — R293 Abnormal posture: Secondary | ICD-10-CM

## 2020-09-15 DIAGNOSIS — G8929 Other chronic pain: Secondary | ICD-10-CM

## 2020-09-15 DIAGNOSIS — R2689 Other abnormalities of gait and mobility: Secondary | ICD-10-CM | POA: Diagnosis not present

## 2020-09-15 DIAGNOSIS — M6281 Muscle weakness (generalized): Secondary | ICD-10-CM

## 2020-09-15 NOTE — Therapy (Addendum)
[facility] [address], [place], [place], [address] Phone: [phone]   Fax:  [phone]  Physical Therapy Treatment  Patient Details  Name: Calvin [name] [name]: [id number] Date of Birth: 03-31-1946 Referring Provider (PT): Lyndal Pulley, DO   Encounter Date: 09/15/2020   PT End of Session - 09/15/20 1530     Visit Number 5    Date for PT Re-Evaluation 10/18/20    Authorization Type [employer]    Progress Note Due on Visit 10    PT Start Time 1445    PT Stop Time 1523    PT Time Calculation (min) 38 min    Activity Tolerance Patient tolerated treatment well    Behavior During Therapy [facility] for tasks assessed/performed             Past Medical History:  Diagnosis Date   Benign prostatic hypertrophy    ED (erectile dysfunction)    Headache(784.0)    Hypertension    Hypogonadism male    Nephrolithiasis    hx of had lithotripsy in 1-10.    Past Surgical History:  Procedure Laterality Date   colonoscopy  03-28-10   per Dr. Fuller Plan, hyperplastic polyps, repeat in 10 yrs    [place]bass  03/12/2012   Procedure: APPENDECTOMY LAPAROSCOPIC;  Surgeon: Harl Bowie, MD;  Location: [facility];  Service: General;  Laterality: N/A;   STAPLE HEMORRHOIDECTOMY  04-14-10   per Dr. Johney Maine    TONSILLECTOMY      There were no vitals filed for this visit.   Subjective Assessment - 09/15/20 1447     Subjective Patient reports no pain this date. Had a kidney stone while travelling but that has now resolved and he is feeling better.    Pertinent History has had viscosupplementation to Lt knee    Limitations Standing;Walking;House hold activities    How long can you stand comfortably? 15 min    How long can you walk comfortably? short community distances    Diagnostic tests Lt knee x-ray: OA throughout, greatest in patellofemoral compartment  lumbar x-ray: chronic T11 compression  deformity, disc narrowing L4/5, spondylosis and facet hypertrophy L4-S1 bil    Patient Stated Goals reduce pain    Currently in Pain? No/denies    Pain Onset More than a month ago    Multiple Pain Sites No                               [facility] Adult PT Treatment/Exercise - 09/15/20 0001       Lumbar Exercises: Stretches   Other Lumbar Stretch Exercise seated ball rollouts for lumbar flexion and flex/SB combo bil x 5 reps each way      Lumbar Exercises: Aerobic   Nustep L2 x 5' PT present to discuss status      Lumbar Exercises: Machines for Strengthening   Other Lumbar Machine Exercise palloff 20#; x10 each side      Lumbar Exercises: Standing   Other Standing Lumbar Exercises blue tband pullbacks/forwards 10x3" each for standing core recruitment      Lumbar Exercises: Seated   Sit to Stand 10 reps    Sit to Stand Limitations 5# kb; VC for controlled descent      Lumbar Exercises: Supine   Bridge --   12 repetitions   Bridge Limitations added blue loop band abd with bridge,  VC for TA vs doming      Lumbar Exercises: Sidelying   Clam Both    Clam Limitations 12 repetitions      Lumbar Exercises: Prone   Other Prone Lumbar Exercises counter plank 3x15 sec      Lumbar Exercises: Quadruped   Madcat/Old Horse 10 reps    Madcat/Old Horse Limitations VC for thoracic ext vs lumbar    Plank 1x20"   forearms and knees down                     PT Short Term Goals - 08/02/20 1008       PT SHORT TERM GOAL #1   Title Pt will be ind with initial HEP    Status On-going               PT Long Term Goals - 07/26/20 1738       PT LONG TERM GOAL #1   Title Pt will achieve LE flexibility to [facility] to reduce undue strain on spine.    Time 12    Period Weeks    Status New    Target Date 10/18/20      PT LONG TERM GOAL #2   Title pt will improve FOTO score from 56% to >/= 64% to demo improved function    Time 12    Period Weeks    Status New     Target Date 10/18/20      PT LONG TERM GOAL #3   Title Pt will improve gait efficiency with improved step length, heel strike and trunk rotation with arm swing and glut use.    Time 12    Period Weeks    Status New    Target Date 10/18/20      PT LONG TERM GOAL #4   Title Pt will be able to stand for at least 20 min with min pain to perform kitchen tasks such as cook and wash dishes.    Time 12    Period Weeks    Status New    Target Date 10/18/20      PT LONG TERM GOAL #5   Title Pt will be ind with advanced HEP and understand how to safely progress    Time 12    Period Weeks    Status New    Target Date 10/18/20                   Plan - 09/15/20 1531     Clinical Impression Statement Patient arriving with no knee pain and know low back pain. Only experiences back pain when standing for kitchen tasks. Has been compliant with HEP. Plans to become more active at the gym. Noting good form with plank exercise as patient requiring no cuing for correct performance. Requiring VC for controlled descent when performing sit to stand activity. Would benefit from continued skilled intervention to address impairments for decreased pain and improved activity tolerance. Discussed further scheduling patient as POC does not end until 5/3 and patient currently no further scheduled visits. Patient declining to schedule at end of session. Advised patient of POC expiration and patient verbalizing understanding.    Personal Factors and Comorbidities Age;Fitness;Time since onset of injury/illness/exacerbation    Examination-Activity Limitations Locomotion Level;Transfers;Stand;Bend;Lift;Stairs;Squat    Rehab Potential Good    PT Frequency 1x / week    PT Duration 12 weeks    PT Treatment/Interventions ADLs/Self Care Home Management;Cryotherapy;Moist Heat;Electrical Stimulation;Joint Manipulations;Spinal Manipulations;Dry  needling;Passive range of motion;Patient/family education;Gait  training;Therapeutic activities;Therapeutic exercise;Balance training;Neuromuscular re-education;Manual techniques;Taping    PT Next Visit Plan progress core and functional LE strengthening to patient tolerance    PT Home Exercise Plan Access Code: [email or web address]    Consulted and Agree with Plan of Care Patient             Patient will benefit from skilled therapeutic intervention in order to improve the following deficits and impairments:  Abnormal gait,Decreased range of motion,Difficulty walking,Pain,Postural dysfunction,Decreased strength,Decreased mobility,Hypomobility,Impaired flexibility,Improper body mechanics,Decreased activity tolerance,Decreased balance  Visit Diagnosis: Chronic midline low back pain without sciatica  Chronic pain of left knee  Abnormal posture  Other abnormalities of gait and mobility  Muscle weakness (generalized)     Problem List Patient Active Problem List   Diagnosis Date Noted   Low back pain 06/09/2020   COVID-19 virus infection 02/24/2020   Sprain of index finger, initial encounter 04/23/2018   Primary osteoarthritis of left knee 04/23/2018   Urinary tract infection, site not specified 01/30/2016   Orchitis 01/30/2016   Epididymitis 01/19/2016   Hypogonadism in male 01/19/2016   Foot swelling 01/19/2016   Testicular pain, right    Depression 12/08/2014   Insomnia 10/30/2013   INTERNAL HEMORRHOIDS WITH OTHER COMPLICATION 50/27/7412   BPH (benign prostatic hyperplasia) 10/25/2008   NEPHROLITHIASIS, HX OF 10/25/2008   Headache 09/15/2007   Essential hypertension 07/07/2007   PHYSICAL THERAPY DISCHARGE SUMMARY  Visits from Start of Care: 5  Current functional level related to goals / functional outcomes: See above   Remaining deficits: See above   Education / Equipment: See above   Patient agrees to discharge. Patient goals were partially met. Patient is being discharged due to not returning since the last visit.   Everardo All  PT, DPT  09/15/20 6:14 PM  [facility] [address], [place], [place], [address] Phone: [phone]   Fax:  [phone]  Name: DAREN YEAGLE [name]: [id number] Date of Birth: 1945-10-29

## 2020-09-20 DIAGNOSIS — Z85828 Personal history of other malignant neoplasm of skin: Secondary | ICD-10-CM | POA: Diagnosis not present

## 2020-09-20 DIAGNOSIS — D485 Neoplasm of uncertain behavior of skin: Secondary | ICD-10-CM | POA: Diagnosis not present

## 2020-09-20 DIAGNOSIS — L570 Actinic keratosis: Secondary | ICD-10-CM | POA: Diagnosis not present

## 2020-09-20 DIAGNOSIS — C44622 Squamous cell carcinoma of skin of right upper limb, including shoulder: Secondary | ICD-10-CM | POA: Diagnosis not present

## 2020-09-20 DIAGNOSIS — C44319 Basal cell carcinoma of skin of other parts of face: Secondary | ICD-10-CM | POA: Diagnosis not present

## 2020-09-20 DIAGNOSIS — L905 Scar conditions and fibrosis of skin: Secondary | ICD-10-CM | POA: Diagnosis not present

## 2020-09-20 DIAGNOSIS — C44629 Squamous cell carcinoma of skin of left upper limb, including shoulder: Secondary | ICD-10-CM | POA: Diagnosis not present

## 2020-09-20 DIAGNOSIS — L820 Inflamed seborrheic keratosis: Secondary | ICD-10-CM | POA: Diagnosis not present

## 2020-09-21 ENCOUNTER — Other Ambulatory Visit: Payer: Self-pay

## 2020-09-21 ENCOUNTER — Ambulatory Visit (AMBULATORY_SURGERY_CENTER): Payer: Self-pay | Admitting: *Deleted

## 2020-09-21 VITALS — Ht 71.0 in | Wt 238.0 lb

## 2020-09-21 DIAGNOSIS — Z1211 Encounter for screening for malignant neoplasm of colon: Secondary | ICD-10-CM

## 2020-09-21 MED ORDER — PEG 3350-KCL-NA BICARB-NACL 420 G PO SOLR: 4000.0000 mL | Freq: Once | ORAL | 0 refills | Status: AC

## 2020-09-21 NOTE — Progress Notes (Signed)
Patient is here in-person for PV. Patient denies any allergies to eggs or soy. Patient denies any problems with anesthesia/sedation. Patient denies any oxygen use at home. Patient denies taking any diet/weight loss medications or blood thinners. Patient is not being treated for MRSA or C-diff. Patient is aware of our care-partner policy and GXIVH-29 safety protocol.   Patient is COVID-19 vaccinated, per patient.

## 2020-09-27 DIAGNOSIS — N400 Enlarged prostate without lower urinary tract symptoms: Secondary | ICD-10-CM | POA: Diagnosis not present

## 2020-09-27 DIAGNOSIS — E669 Obesity, unspecified: Secondary | ICD-10-CM | POA: Diagnosis not present

## 2020-09-27 DIAGNOSIS — R32 Unspecified urinary incontinence: Secondary | ICD-10-CM | POA: Diagnosis not present

## 2020-09-27 DIAGNOSIS — N529 Male erectile dysfunction, unspecified: Secondary | ICD-10-CM | POA: Diagnosis not present

## 2020-09-27 DIAGNOSIS — I10 Essential (primary) hypertension: Secondary | ICD-10-CM | POA: Diagnosis not present

## 2020-09-27 DIAGNOSIS — G8929 Other chronic pain: Secondary | ICD-10-CM | POA: Diagnosis not present

## 2020-09-27 DIAGNOSIS — Z6832 Body mass index (BMI) 32.0-32.9, adult: Secondary | ICD-10-CM | POA: Diagnosis not present

## 2020-09-27 DIAGNOSIS — M1990 Unspecified osteoarthritis, unspecified site: Secondary | ICD-10-CM | POA: Diagnosis not present

## 2020-09-27 DIAGNOSIS — Z7722 Contact with and (suspected) exposure to environmental tobacco smoke (acute) (chronic): Secondary | ICD-10-CM | POA: Diagnosis not present

## 2020-09-27 DIAGNOSIS — E785 Hyperlipidemia, unspecified: Secondary | ICD-10-CM | POA: Diagnosis not present

## 2020-09-30 ENCOUNTER — Telehealth: Payer: Self-pay | Admitting: Pharmacist

## 2020-09-30 NOTE — Chronic Care Management (AMB) (Signed)
    Chronic Care Management Pharmacy Assistant   Name: Calvin [name]  [name]: [id number] DOB: Sep 11, 1945  Reason for Encounter: Disease State   Conditions to be addressed/monitored: HTN and HLD  Recent office visits:    Recent consult visits:    Hospital visits:  None in previous 6 months  Medications: Outpatient Encounter Medications as of 09/30/2020  Medication Sig   amLODipine (NORVASC) 5 MG tablet Take 1 tablet (5 mg total) by mouth daily.   atorvastatin (LIPITOR) 20 MG tablet Take 1 tablet (20 mg total) by mouth daily.   Coenzyme Q10 (CO Q-10) 100 MG CAPS Take by mouth daily.   diclofenac sodium (VOLTAREN) 1 % GEL Apply topically to affected area qid   finasteride (PROSCAR) 5 MG tablet Take 1 tablet (5 mg total) by mouth daily.   glucosamine-chondroitin 500-400 MG tablet Take 1 tablet by mouth daily.   hydrochlorothiazide (HYDRODIURIL) 25 MG tablet Take 1 tablet (25 mg total) by mouth daily.   losartan (COZAAR) 100 MG tablet Take 1 tablet (100 mg total) by mouth daily.   sildenafil (VIAGRA) 100 MG tablet Take 1 tablet (100 mg total) by mouth daily as needed for erectile dysfunction.   tamsulosin (FLOMAX) 0.4 MG CAPS capsule Take 1 capsule (0.4 mg total) by mouth daily.   No facility-administered encounter medications on file as of 09/30/2020.   Third unsuccessful telephone outreach was attempted today. Left several messages and no return.  Star Rating Drugs:  [name], [place] [phone]

## 2020-10-03 ENCOUNTER — Encounter: Payer: Self-pay | Admitting: Gastroenterology

## 2020-10-05 ENCOUNTER — Encounter: Payer: Self-pay | Admitting: Gastroenterology

## 2020-10-05 ENCOUNTER — Other Ambulatory Visit: Payer: Self-pay

## 2020-10-05 ENCOUNTER — Ambulatory Visit (AMBULATORY_SURGERY_CENTER): Payer: Medicare HMO | Admitting: Gastroenterology

## 2020-10-05 ENCOUNTER — Other Ambulatory Visit: Payer: Self-pay | Admitting: Gastroenterology

## 2020-10-05 ENCOUNTER — Encounter: Payer: Medicare HMO | Admitting: Gastroenterology

## 2020-10-05 VITALS — BP 115/72 | HR 89 | Temp 98.6°F | Resp 21 | Ht 71.0 in | Wt 238.0 lb

## 2020-10-05 DIAGNOSIS — Z1211 Encounter for screening for malignant neoplasm of colon: Secondary | ICD-10-CM | POA: Diagnosis not present

## 2020-10-05 DIAGNOSIS — D120 Benign neoplasm of cecum: Secondary | ICD-10-CM | POA: Diagnosis not present

## 2020-10-05 DIAGNOSIS — D124 Benign neoplasm of descending colon: Secondary | ICD-10-CM

## 2020-10-05 DIAGNOSIS — D122 Benign neoplasm of ascending colon: Secondary | ICD-10-CM | POA: Diagnosis not present

## 2020-10-05 HISTORY — PX: OTHER SURGICAL HISTORY: SHX169

## 2020-10-05 MED ORDER — SODIUM CHLORIDE 0.9 % IV SOLN
500.0000 mL | Freq: Once | INTRAVENOUS | Status: DC
Start: 2020-10-05 — End: 2020-10-05

## 2020-10-05 NOTE — Progress Notes (Signed)
pt tolerated well. VSS. awake and to recovery. Report given to RN.  

## 2020-10-05 NOTE — Op Note (Signed)
[place] Patient Name: Calvin [name] Procedure Date: 10/05/2020 2:02 PM [name]: [id number] Endoscopist: Ladene Artist , MD Age: 75 Referring MD:  Date of Birth: Nov 01, 1945 Gender: Male Account #: [id number] Procedure:                Colonoscopy Indications:              Screening for colorectal malignant neoplasm Medicines:                Monitored Anesthesia Care Procedure:                Pre-Anesthesia Assessment:                           - Prior to the procedure, a History and Physical                            was performed, and patient medications and                            allergies were reviewed. The patient's tolerance of                            previous anesthesia was also reviewed. The risks                            and benefits of the procedure and the sedation                            options and risks were discussed with the patient.                            All questions were answered, and informed consent                            was obtained. Prior Anticoagulants: The patient has                            taken no previous anticoagulant or antiplatelet                            agents. ASA Grade Assessment: II - A patient with                            mild systemic disease. After reviewing the risks                            and benefits, the patient was deemed in                            satisfactory condition to undergo the procedure.                           After obtaining informed consent, the colonoscope  was passed under direct vision. Throughout the                            procedure, the patient's blood pressure, pulse, and                            oxygen saturations were monitored continuously. The                            Olympus CF-HQ190L ([id number]) Colonoscope was                            introduced through the anus and advanced to the the                            cecum, identified by  appendiceal orifice and                            ileocecal valve. The ileocecal valve, appendiceal                            orifice, and rectum were photographed. The quality                            of the bowel preparation was good. The colonoscopy                            was somewhat difficult due to a redundant colon,                            significant looping and a tortuous colon.                            Successful completion of the procedure was aided by                            using manual pressure, straightening and shortening                            the scope to obtain bowel loop reduction and using                            scope torsion. The patient tolerated the procedure                            well. Scope In: 2:05:38 PM Scope Out: 2:28:16 PM Scope Withdrawal Time: 0 hours 16 minutes 16 seconds  Total Procedure Duration: 0 hours 22 minutes 38 seconds  Findings:                 The perianal and digital rectal examinations were                            normal.  Four sessile polyps were found in the descending                            colon (1), ascending colon (2) and ileocecal valve                            (1). The polyps were 6 to 9 mm in size. These                            polyps were removed with a cold snare. Resection                            and retrieval were complete.                           A few medium-mouthed diverticula were found in the                            left colon.                           The exam was otherwise without abnormality on                            direct and retroflexion views. Complications:            No immediate complications. Estimated blood loss:                            None. Estimated Blood Loss:     Estimated blood loss: none. Impression:               - Four 6 to 9 mm polyps in the descending colon, in                            the ascending colon and at the  ileocecal valve,                            removed with a cold snare. Resected and retrieved.                           - Mild diverticulosis in the left colon.                           - The examination was otherwise normal on direct                            and retroflexion views. Recommendation:           - Repeat colonoscopy date to be determined after                            pending pathology results are reviewed for  surveillance based on pathology results.                           - Patient has a contact number available for                            emergencies. The signs and symptoms of potential                            delayed complications were discussed with the                            patient. Return to normal activities tomorrow.                            Written discharge instructions were provided to the                            patient.                           - High fiber diet.                           - Continue present medications.                           - Await pathology results.                           - No aspirin, ibuprofen, naproxen, or other                            non-steroidal anti-inflammatory drugs for 2 weeks                            after polyp removal. Ladene Artist, MD 10/05/2020 2:33:11 PM This report has been signed electronically.

## 2020-10-05 NOTE — Progress Notes (Signed)
Pt's states no medical or surgical changes since previsit or office visit.   VS taken by CW 

## 2020-10-05 NOTE — Progress Notes (Signed)
Called to room to assist during endoscopic procedure.  Patient ID and intended procedure confirmed with present staff. Received instructions for my participation in the procedure from the performing physician.  

## 2020-10-05 NOTE — Patient Instructions (Signed)
Handouts given for polyps, diverticulosis and high fiber diet.  Await pathology results.  No Aspirin, Ibuprofen, naproxen or other NSAIDS for 2 weeks after polyp removal.  YOU HAD AN ENDOSCOPIC PROCEDURE TODAY AT [facility]:   Refer to the procedure report that was given to you for any specific questions about what was found during the examination.  If the procedure report does not answer your questions, please call your gastroenterologist to clarify.  If you requested that your care partner not be given the details of your procedure findings, then the procedure report has been included in a sealed envelope for you to review at your convenience later.  YOU SHOULD EXPECT: Some feelings of bloating in the abdomen. Passage of more gas than usual.  Walking can help get rid of the air that was put into your GI tract during the procedure and reduce the bloating. If you had a lower endoscopy (such as a colonoscopy or flexible sigmoidoscopy) you may notice spotting of blood in your stool or on the toilet paper. If you underwent a bowel prep for your procedure, you may not have a normal bowel movement for a few days.  Please Note:  You might notice some irritation and congestion in your nose or some drainage.  This is from the oxygen used during your procedure.  There is no need for concern and it should clear up in a day or so.  SYMPTOMS TO REPORT IMMEDIATELY:   Following lower endoscopy (colonoscopy or flexible sigmoidoscopy):  Excessive amounts of blood in the stool  Significant tenderness or worsening of abdominal pains  Swelling of the abdomen that is new, acute  Fever of 100F or higher  For urgent or emergent issues, a gastroenterologist can be reached at any hour by calling [phone]. Do not use MyChart messaging for urgent concerns.    DIET:  We do recommend a small meal at first, but then you may proceed to your regular diet.  Drink plenty of fluids but you should  avoid alcoholic beverages for 24 hours.  ACTIVITY:  You should plan to take it easy for the rest of today and you should NOT DRIVE or use heavy machinery until tomorrow (because of the sedation medicines used during the test).    FOLLOW UP: Our staff will call the number listed on your records 48-72 hours following your procedure to check on you and address any questions or concerns that you may have regarding the information given to you following your procedure. If we do not reach you, we will leave a message.  We will attempt to reach you two times.  During this call, we will ask if you have developed any symptoms of COVID 19. If you develop any symptoms (ie: fever, flu-like symptoms, shortness of breath, cough etc.) before then, please call [phone].  If you test positive for Covid 19 in the 2 weeks post procedure, please call and report this information to [place].    If any biopsies were taken you will be contacted by phone or by letter within the next 1-3 weeks.  Please call us at [phone] if you have not heard about the biopsies in 3 weeks.    SIGNATURES/CONFIDENTIALITY: You and/or your care partner have signed paperwork which will be entered into your electronic medical record.  These signatures attest to the fact that that the information above on your After Visit Summary has been reviewed and is understood.  Full responsibility of the confidentiality of  this discharge information lies with you and/or your care-partner.

## 2020-10-07 ENCOUNTER — Telehealth: Payer: Self-pay

## 2020-10-07 NOTE — Telephone Encounter (Signed)
Attempted call back, no answer left VM. 

## 2020-10-07 NOTE — Telephone Encounter (Signed)
LVM

## 2020-10-17 ENCOUNTER — Encounter: Payer: Self-pay | Admitting: Gastroenterology

## 2020-10-21 DIAGNOSIS — D0462 Carcinoma in situ of skin of left upper limb, including shoulder: Secondary | ICD-10-CM | POA: Diagnosis not present

## 2020-10-21 DIAGNOSIS — C44319 Basal cell carcinoma of skin of other parts of face: Secondary | ICD-10-CM | POA: Diagnosis not present

## 2020-11-02 DIAGNOSIS — D0461 Carcinoma in situ of skin of right upper limb, including shoulder: Secondary | ICD-10-CM | POA: Diagnosis not present

## 2020-11-11 DIAGNOSIS — L905 Scar conditions and fibrosis of skin: Secondary | ICD-10-CM | POA: Diagnosis not present

## 2020-11-11 DIAGNOSIS — D0462 Carcinoma in situ of skin of left upper limb, including shoulder: Secondary | ICD-10-CM | POA: Diagnosis not present

## 2020-12-09 ENCOUNTER — Telehealth: Payer: Self-pay | Admitting: Pharmacist

## 2020-12-09 NOTE — Chronic Care Management (AMB) (Signed)
Chronic Care Management Pharmacy Assistant   Name: Calvin [name]  [name]: [id number] DOB: 1946-01-22  Reason for Encounter: Disease State/ Hypertension Adherence Call   Conditions to be addressed/monitored: HTN  Recent office visits:  None  Recent consult visits:  10/05/2020- Lucio Edward, MD (Gastroenterology) Colonoscopy Procedure  Hospital visits:  None in previous 6 months  Reviewed chart prior to disease state call. Spoke with patient regarding BP  Recent Office Vitals: BP Readings from Last 3 Encounters:  10/05/20 115/72  09/14/20 120/80  06/30/20 122/70   Pulse Readings from Last 3 Encounters:  10/05/20 89  09/14/20 96  06/30/20 88    Wt Readings from Last 3 Encounters:  10/05/20 238 lb (108 kg)  09/21/20 238 lb (108 kg)  09/14/20 241 lb (109.3 kg)     Kidney Function Lab Results  Component Value Date/Time   CREATININE 0.84 01/05/2020 10:47 AM   CREATININE 0.86 01/02/2019 12:32 PM   CREATININE 0.81 01/13/2018 03:08 PM   GFR 87.54 01/02/2019 12:32 PM   GFRNONAA >60 01/21/2016 04:38 AM   GFRAA >60 01/21/2016 04:38 AM    BMP Latest Ref Rng & Units 01/05/2020 01/02/2019 01/13/2018  Glucose 65 - 99 mg/dL 99 97 89  BUN 7 - 25 mg/dL 14 17 16   Creatinine 0.70 - 1.18 mg/dL 0.84 0.86 0.81  BUN/Creat Ratio 6 - 22 (calc) NOT APPLICABLE - -  Sodium 409 - 146 mmol/L 140 138 140  Potassium 3.5 - 5.3 mmol/L 3.7 3.7 3.5  Chloride 98 - 110 mmol/L 104 101 102  CO2 20 - 32 mmol/L 23 27 24   Calcium 8.6 - 10.3 mg/dL 9.5 9.7 9.8    Current antihypertensive regimen:  Amlodipine 5 mg daily HCTZ 25 mg daily (Midnight) Losartan 100 mg daily  How often are you checking your Blood Pressure? daily Current home BP readings: 110/60 on 12/14/20 What recent interventions/DTPs have been made by any provider to improve Blood Pressure control since last CPP Visit: YES. Patient watches what he eats. Less steaks, less vodka drinks and no fast foods. These interventions have helped  lower BP readings.  Any recent hospitalizations or ED visits since last visit with CPP? No What diet changes have been made to improve Blood Pressure Control?  Patient eats more fruits and vegetables, chicken with no fast foods and lowered alcohol consumption. He stated he eats steak once in a while. What exercise is being done to improve your Blood Pressure Control?  Patient walks a mile every other day. Patient participates virtual classes at the [facility] 5-6 days a week. Patient has a weight loss goal of weighing 200lbs in 3 months after gaining 45lbs earlier this year from multiple foot and knee surgeries.   Adherence Review: Is the patient currently on ACE/ARB medication? Yes Does the patient have >5 day gap between last estimated fill dates? Yes- HCTZ last filled 06/22/2020     Medications: Outpatient Encounter Medications as of 12/09/2020  Medication Sig   amLODipine (NORVASC) 5 MG tablet Take 1 tablet (5 mg total) by mouth daily.   atorvastatin (LIPITOR) 20 MG tablet Take 1 tablet (20 mg total) by mouth daily.   Coenzyme Q10 (CO Q-10) 100 MG CAPS Take by mouth daily.   diclofenac sodium (VOLTAREN) 1 % GEL Apply topically to affected area qid (Patient not taking: Reported on 10/05/2020)   finasteride (PROSCAR) 5 MG tablet Take 1 tablet (5 mg total) by mouth daily.   glucosamine-chondroitin 500-400 MG tablet Take 1 tablet  by mouth daily.   hydrochlorothiazide (HYDRODIURIL) 25 MG tablet Take 1 tablet (25 mg total) by mouth daily.   losartan (COZAAR) 100 MG tablet Take 1 tablet (100 mg total) by mouth daily.   sildenafil (VIAGRA) 100 MG tablet Take 1 tablet (100 mg total) by mouth daily as needed for erectile dysfunction.   tamsulosin (FLOMAX) 0.4 MG CAPS capsule Take 1 capsule (0.4 mg total) by mouth daily.   No facility-administered encounter medications on file as of 12/09/2020.    Care Gaps: 04/14/2020- Annual Wellness Visit 10/05/2020- Colonoscopy   Star Rating Drugs: Atorvastatin  20 mg- Last filled 09/16/2020 for 90 day supply at [facility] Losartan 100 mg- last filled 09/16/2020 for 90 day supply at [facility] with Jeni Salles, CPP 02/06/2021  1st attempt on 12/09/20 LVM on patients phone. 2nd attempt on 12/14/20 LVM on patients phone  Pattricia Boss, [place] Pharmacist Assistant [phone]

## 2020-12-13 ENCOUNTER — Other Ambulatory Visit: Payer: Self-pay | Admitting: Family Medicine

## 2021-01-10 ENCOUNTER — Telehealth (INDEPENDENT_AMBULATORY_CARE_PROVIDER_SITE_OTHER): Payer: Medicare HMO | Admitting: Family Medicine

## 2021-01-10 ENCOUNTER — Encounter: Payer: Self-pay | Admitting: Family Medicine

## 2021-01-10 VITALS — Temp 101.0°F

## 2021-01-10 DIAGNOSIS — B349 Viral infection, unspecified: Secondary | ICD-10-CM | POA: Diagnosis not present

## 2021-01-10 MED ORDER — MOLNUPIRAVIR EUA 200MG CAPSULE: 4.0000 | ORAL_CAPSULE | Freq: Two times a day (BID) | ORAL | 0 refills | Status: AC

## 2021-01-10 NOTE — Progress Notes (Signed)
   Subjective:    Patient ID: Calvin [name], male    DOB: 1946-05-25, 75 y.o.   [name]: [id number]  HPI Virtual Visit via Telephone Note  I connected with the patient on 01/10/21 at  1:45 PM EDT by telephone and verified that I am speaking with the correct person using two identifiers.   I discussed the limitations, risks, security and privacy concerns of performing an evaluation and management service by telephone and the availability of in person appointments. I also discussed with the patient that there may be a patient responsible charge related to this service. The patient expressed understanding and agreed to proceed.  Location patient: home Location provider: work or home office Participants present for the call: patient, provider Patient did not have a visit in the prior 7 days to address this/these issue(s).   History of Present Illness: Here for the onset last night of fever to 101 degrees, body aches, fatigue, a dry cough, and some diarrhea. No SOB. He is drinking fluids. His BP is 121/77, oxygen sat is 98%.    Observations/Objective: Patient sounds cheerful and well on the phone. I do not appreciate any SOB. Speech and thought processing are grossly intact. Patient reported vitals:  Assessment and Plan: Viral illness, most likely due to Covid-19. We will treat with 5 days of Molnupiravir. He will drink fluids and take Tylenol. Quarantine for 5 days.  Alysia Penna, MD   Follow Up Instructions:     [phone] 11-20 [phone] 21-30 I did not refer this patient for an OV in the next 24 hours for this/these issue(s).  I discussed the assessment and treatment plan with the patient. The patient was provided an opportunity to ask questions and all were answered. The patient agreed with the plan and demonstrated an understanding of the instructions.   The patient was advised to call back or seek an in-person evaluation if the symptoms worsen or if the condition fails to  improve as anticipated.  I provided 15 minutes of non-face-to-face time during this encounter.   Alysia Penna, MD     Review of Systems     Objective:   Physical Exam        Assessment & Plan:

## 2021-01-12 ENCOUNTER — Other Ambulatory Visit: Payer: Self-pay | Admitting: Family Medicine

## 2021-01-23 ENCOUNTER — Telehealth: Payer: Self-pay | Admitting: Pharmacist

## 2021-01-23 NOTE — Chronic Care Management (AMB) (Signed)
    Chronic Care Management Pharmacy Assistant   Name: Calvin [name]  [name]: [id number] DOB: 1945/12/23  Reason for Encounter: General Adherence Call    Recent office visits:  01-10-2021 Video Visit Laurey Morale, MD - Tele health for viral illness. Prescribed Molnuprirvir 800 mg twice daily.  Recent consult visits:  None  Hospital visits:  None in previous 6 months  Medications: Outpatient Encounter Medications as of 01/23/2021  Medication Sig   amLODipine (NORVASC) 5 MG tablet TAKE 1 TABLET BY MOUTH EVERY DAY   atorvastatin (LIPITOR) 20 MG tablet Take 1 tablet (20 mg total) by mouth daily. *appointment with labs needed for future refills*   Coenzyme Q10 (CO Q-10) 100 MG CAPS Take by mouth daily.   diclofenac sodium (VOLTAREN) 1 % GEL Apply topically to affected area qid   finasteride (PROSCAR) 5 MG tablet TAKE 1 TABLET BY MOUTH EVERY DAY   glucosamine-chondroitin 500-400 MG tablet Take 1 tablet by mouth daily.   hydrochlorothiazide (HYDRODIURIL) 25 MG tablet TAKE 1 TABLET BY MOUTH EVERY DAY   losartan (COZAAR) 100 MG tablet TAKE 1 TABLET BY MOUTH EVERY DAY   sildenafil (VIAGRA) 100 MG tablet Take 1 tablet (100 mg total) by mouth daily as needed for erectile dysfunction.   tamsulosin (FLOMAX) 0.4 MG CAPS capsule Take 1 capsule (0.4 mg total) by mouth daily. *appointment needed for future refills*   No facility-administered encounter medications on file as of 01/23/2021.   Notes: Call to patient he reports he is doing better, recovering from illness still not feeling at 100% yet but is on the mend. He reports he is taking his medications as prescribed. Did run out of one of his BP medications recently as the pharmacy did not call the office for a renewal. He was able to get it squared away and had the medication delivered to him in about 3 days time. He is taking all medications as prescribed and does not need any additional fills at this time. No recent hospital or urgent care stays.  Patient does report that he has trouble falling asleep occassionally so once he gets to bed he stays tries to get some rest and does not get up very early in the morning. Per Clinical Pharmacist rescheduled his follow up appointment to a later date in October patient was in agreement and ok with the change, advised him to give me a call if there were any concerns or issues prior to this appointment.   Care Gaps: Hepatitis C Screening- Overdue Flu Vaccine - Overdue CCM F/U scheduled for - 04-05-2021 2 pm call  Star Rating Drugs: Atorvastatin 20 mg- Last filled 12-13-2020 90 DS at [facility] Losartan 100 mg - Last filled 12-13-2020 90 DS at [facility] Pharmacist Assistant [phone]

## 2021-02-06 ENCOUNTER — Telehealth: Payer: Self-pay

## 2021-02-22 ENCOUNTER — Telehealth: Payer: Self-pay | Admitting: Family Medicine

## 2021-02-22 NOTE — Telephone Encounter (Signed)
Left message for patient to call back and schedule Medicare Annual Wellness Visit (AWV) either virtually or in office. Left  my Calvin [name] number [phone]   Last AWV 04/14/20  please schedule at anytime with [facility] Nurse Health Advisor 1 or 2   This should be a 45 minute visit.   Holland Falling can schedule AWV calendar year

## 2021-03-08 ENCOUNTER — Ambulatory Visit (INDEPENDENT_AMBULATORY_CARE_PROVIDER_SITE_OTHER): Payer: Medicare HMO

## 2021-03-08 ENCOUNTER — Other Ambulatory Visit: Payer: Self-pay

## 2021-03-08 VITALS — BP 138/68 | HR 100 | Temp 98.4°F | Ht 72.0 in | Wt 244.0 lb

## 2021-03-08 DIAGNOSIS — Z0000 Encounter for general adult medical examination without abnormal findings: Secondary | ICD-10-CM

## 2021-03-08 NOTE — Patient Instructions (Signed)
Calvin [name] , Thank you for taking time to come for your Medicare Wellness Visit. I appreciate your ongoing commitment to your health goals. Please review the following plan we discussed and let me know if I can assist you in the future.   Screening recommendations/referrals: Colonoscopy: 10/07/2020  due 2025 Recommended yearly ophthalmology/optometry visit for glaucoma screening and checkup Recommended yearly dental visit for hygiene and checkup  Vaccinations: Influenza vaccine: due in fall 2022  Pneumococcal vaccine: completed series  Tdap vaccine: 09/22/2015 Shingles vaccine: completed series     Advanced directives: will provide copies   Conditions/risks identified: none   Next appointment: none   Preventive Care 29 Years and Older, Male Preventive care refers to lifestyle choices and visits with your health care provider that can promote health and wellness. What does preventive care include? A yearly physical exam. This is also called an annual well check. Dental exams once or twice a year. Routine eye exams. Ask your health care provider how often you should have your eyes checked. Personal lifestyle choices, including: Daily care of your teeth and gums. Regular physical activity. Eating a healthy diet. Avoiding tobacco and drug use. Limiting alcohol use. Practicing safe sex. Taking low doses of aspirin every day. Taking vitamin and mineral supplements as recommended by your health care provider. What happens during an annual well check? The services and screenings done by your health care provider during your annual well check will depend on your age, overall health, lifestyle risk factors, and family history of disease. Counseling  Your health care provider may ask you questions about your: Alcohol use. Tobacco use. Drug use. Emotional well-being. Home and relationship well-being. Sexual activity. Eating habits. History of falls. Memory and ability to understand  (cognition). Work and work Statistician. Screening  You may have the following tests or measurements: Height, weight, and BMI. Blood pressure. Lipid and cholesterol levels. These may be checked every 5 years, or more frequently if you are over 35 years old. Skin check. Lung cancer screening. You may have this screening every year starting at age 44 if you have a 30-pack-year history of smoking and currently smoke or have quit within the past 15 years. Fecal occult blood test (FOBT) of the stool. You may have this test every year starting at age 97. Flexible sigmoidoscopy or colonoscopy. You may have a sigmoidoscopy every 5 years or a colonoscopy every 10 years starting at age 18. Prostate cancer screening. Recommendations will vary depending on your family history and other risks. Hepatitis C blood test. Hepatitis B blood test. Sexually transmitted disease (STD) testing. Diabetes screening. This is done by checking your blood sugar (glucose) after you have not eaten for a while (fasting). You may have this done every 1-3 years. Abdominal aortic aneurysm (AAA) screening. You may need this if you are a current or former smoker. Osteoporosis. You may be screened starting at age 18 if you are at high risk. Talk with your health care provider about your test results, treatment options, and if necessary, the need for more tests. Vaccines  Your health care provider may recommend certain vaccines, such as: Influenza vaccine. This is recommended every year. Tetanus, diphtheria, and acellular pertussis (Tdap, Td) vaccine. You may need a Td booster every 10 years. Zoster vaccine. You may need this after age 71. Pneumococcal 13-valent conjugate (PCV13) vaccine. One dose is recommended after age 83. Pneumococcal polysaccharide (PPSV23) vaccine. One dose is recommended after age 42. Talk to your health care provider about which  screenings and vaccines you need and how often you need them. This  information is not intended to replace advice given to you by your health care provider. Make sure you discuss any questions you have with your health care provider. Document Released: 07/01/2015 Document Revised: 02/22/2016 Document Reviewed: 04/05/2015 Elsevier Interactive Patient Education  2017 [place] Prevention in the Home Falls can cause injuries. They can happen to people of all ages. There are many things you can do to make your home safe and to help prevent falls. What can I do on the outside of my home? Regularly fix the edges of walkways and driveways and fix any cracks. Remove anything that might make you trip as you walk through a door, such as a raised step or threshold. Trim any bushes or trees on the path to your home. Use bright outdoor lighting. Clear any walking paths of anything that might make someone trip, such as rocks or tools. Regularly check to see if handrails are loose or broken. Make sure that both sides of any steps have handrails. Any raised decks and porches should have guardrails on the edges. Have any leaves, snow, or ice cleared regularly. Use sand or salt on walking paths during winter. Clean up any spills in your garage right away. This includes oil or grease spills. What can I do in the bathroom? Use night lights. Install grab bars by the toilet and in the tub and shower. Do not use towel bars as grab bars. Use non-skid mats or decals in the tub or shower. If you need to sit down in the shower, use a plastic, non-slip stool. Keep the floor dry. Clean up any water that spills on the floor as soon as it happens. Remove soap buildup in the tub or shower regularly. Attach bath mats securely with double-sided non-slip rug tape. Do not have throw rugs and other things on the floor that can make you trip. What can I do in the bedroom? Use night lights. Make sure that you have a light by your bed that is easy to reach. Do not use any sheets or  blankets that are too big for your bed. They should not hang down onto the floor. Have a firm chair that has side arms. You can use this for support while you get dressed. Do not have throw rugs and other things on the floor that can make you trip. What can I do in the kitchen? Clean up any spills right away. Avoid walking on wet floors. Keep items that you use a lot in easy-to-reach places. If you need to reach something above you, use a strong step stool that has a grab bar. Keep electrical cords out of the way. Do not use floor polish or wax that makes floors slippery. If you must use wax, use non-skid floor wax. Do not have throw rugs and other things on the floor that can make you trip. What can I do with my stairs? Do not leave any items on the stairs. Make sure that there are handrails on both sides of the stairs and use them. Fix handrails that are broken or loose. Make sure that handrails are as long as the stairways. Check any carpeting to make sure that it is firmly attached to the stairs. Fix any carpet that is loose or worn. Avoid having throw rugs at the top or bottom of the stairs. If you do have throw rugs, attach them to the floor with carpet  tape. Make sure that you have a light switch at the top of the stairs and the bottom of the stairs. If you do not have them, ask someone to add them for you. What else can I do to help prevent falls? Wear shoes that: Do not have high heels. Have rubber bottoms. Are comfortable and fit you well. Are closed at the toe. Do not wear sandals. If you use a stepladder: Make sure that it is fully opened. Do not climb a closed stepladder. Make sure that both sides of the stepladder are locked into place. Ask someone to hold it for you, if possible. Clearly mark and make sure that you can see: Any grab bars or handrails. First and last steps. Where the edge of each step is. Use tools that help you move around (mobility aids) if they are  needed. These include: Canes. Walkers. Scooters. Crutches. Turn on the lights when you go into a dark area. Replace any light bulbs as soon as they burn out. Set up your furniture so you have a clear path. Avoid moving your furniture around. If any of your floors are uneven, fix them. If there are any pets around you, be aware of where they are. Review your medicines with your doctor. Some medicines can make you feel dizzy. This can increase your chance of falling. Ask your doctor what other things that you can do to help prevent falls. This information is not intended to replace advice given to you by your health care provider. Make sure you discuss any questions you have with your health care provider. Document Released: 03/31/2009 Document Revised: 11/10/2015 Document Reviewed: 07/09/2014 Elsevier Interactive Patient Education  2017 [employer].

## 2021-03-08 NOTE — Progress Notes (Signed)
Subjective:   Calvin [name] is a 75 y.o. male who presents for an Subsequent Medicare Annual Wellness Visit.  Review of Systems    N/a       Objective:    There were no vitals filed for this visit. There is no height or weight on file to calculate BMI.  Advanced Directives 07/26/2020 04/14/2020 09/18/2017 01/19/2016 09/22/2015 03/12/2012  Does Patient Have a Medical Advance Directive? Yes Yes Yes Yes No Patient does not have advance directive  Type of Advance Directive [place];Living will [place];Living will - Living will;Healthcare Power of Attorney - -  Does patient want to make changes to medical advance directive? No - Patient declined No - Patient declined - - - -  Copy of [place] in Chart? No - copy requested No - copy requested - - - -  Would patient like information on creating a medical advance directive? - - - - No - patient declined information -  Pre-existing out of facility DNR order (yellow form or pink MOST form) - - - - - No    Current Medications (verified) Outpatient Encounter Medications as of 03/08/2021  Medication Sig   amLODipine (NORVASC) 5 MG tablet TAKE 1 TABLET BY MOUTH EVERY DAY   atorvastatin (LIPITOR) 20 MG tablet Take 1 tablet (20 mg total) by mouth daily. *appointment with labs needed for future refills*   Coenzyme Q10 (CO Q-10) 100 MG CAPS Take by mouth daily.   diclofenac sodium (VOLTAREN) 1 % GEL Apply topically to affected area qid   finasteride (PROSCAR) 5 MG tablet TAKE 1 TABLET BY MOUTH EVERY DAY   glucosamine-chondroitin 500-400 MG tablet Take 1 tablet by mouth daily.   hydrochlorothiazide (HYDRODIURIL) 25 MG tablet TAKE 1 TABLET BY MOUTH EVERY DAY   losartan (COZAAR) 100 MG tablet TAKE 1 TABLET BY MOUTH EVERY DAY   sildenafil (VIAGRA) 100 MG tablet Take 1 tablet (100 mg total) by mouth daily as needed for erectile dysfunction.   tamsulosin (FLOMAX) 0.4 MG CAPS capsule Take 1 capsule  (0.4 mg total) by mouth daily. *appointment needed for future refills*   No facility-administered encounter medications on file as of 03/08/2021.    Allergies (verified) Patient has no known allergies.   History: Past Medical History:  Diagnosis Date   Benign prostatic hypertrophy    ED (erectile dysfunction)    Headache(784.0)    Hypertension    Hypogonadism male    Nephrolithiasis    hx of had lithotripsy in 1-10.   Past Surgical History:  Procedure Laterality Date   colonoscopy  03-28-10   per Dr. Fuller Plan, hyperplastic polyps, repeat in 10 yrs    [place]  03/12/2012   Procedure: APPENDECTOMY LAPAROSCOPIC;  Surgeon: Harl Bowie, MD;  Location: [facility];  Service: General;  Laterality: N/A;   STAPLE HEMORRHOIDECTOMY  04-14-10   per Dr. Johney Maine    TONSILLECTOMY     Family History  Problem Relation Age of Onset   Hypertension Other    Stroke Other    Heart disease Other        rheumatic    Coronary artery disease Other    Colon cancer Neg Hx    Colon polyps Neg Hx    Esophageal cancer Neg Hx    Rectal cancer Neg Hx    Stomach cancer Neg Hx    Social History   Socioeconomic History   Marital  status: Widowed    Spouse name: Not on file   Number of children: Not on file   Years of education: Not on file   Highest education level: Not on file  Occupational History   Not on file  Tobacco Use   Smoking status: Never   Smokeless tobacco: Never  Vaping Use   Vaping Use: Never used  Substance and Sexual Activity   Alcohol use: Yes    Alcohol/week: 14.0 standard drinks    Types: 14 Standard drinks or equivalent per week    Comment: "to much" drinks vodka    Drug use: No   Sexual activity: Not on file  Other Topics Concern   Not on file  Social History Narrative   Not on file   Social Determinants of Health   Financial Resource Strain: Low Risk    Difficulty of Paying Living Expenses: Not hard at all  Food Insecurity: No  Food Insecurity   Worried About Charity fundraiser in the Last Year: Never true   [place] in the Last Year: Never true  Transportation Needs: No Transportation Needs   Lack of Transportation (Medical): No   Lack of Transportation (Non-Medical): No  Physical Activity: Insufficiently Active   Days of Exercise per Week: 4 days   Minutes of Exercise per Session: 30 min  Stress: No Stress Concern Present   Feeling of Stress : Not at all  Social Connections: Socially Isolated   Frequency of Communication with Friends and Family: More than three times a week   Frequency of Social Gatherings with Friends and Family: Once a week   Attends Religious Services: Never   Marine scientist or Organizations: No   Attends Archivist Meetings: Never   Marital Status: Widowed    Tobacco Counseling Counseling given: Not Answered   Clinical Intake:                 Diabetic?no         Activities of Daily Living In your present state of health, do you have any difficulty performing the following activities: 04/14/2020  Hearing? Y  Comment patient has bilateral hearing aids  Vision? N  Difficulty concentrating or making decisions? N  Walking or climbing stairs? Y  Comment Has knee pain  Dressing or bathing? N  Doing errands, shopping? N  Preparing Food and eating ? N  Using the Toilet? N  In the past six months, have you accidently leaked urine? Y  Comment has bladder leakage  Do you have problems with loss of bowel control? N  Managing your Medications? N  Managing your Finances? N  Housekeeping or managing your Housekeeping? N  Some recent data might be hidden    Patient Care Team: Laurey Morale, MD as PCP - General Michaelle Birks Glean Salvo, [facility] as Pharmacist (Pharmacist)  Indicate any recent Medical Services you may have received from other than [facility] providers in the past year (date may be approximate).     Assessment:   This is a routine  wellness examination for [name].  Hearing/Vision screen No results found.  Dietary issues and exercise activities discussed:     Goals Addressed   None    Depression Screen PHQ 2/9 Scores 04/14/2020 02/24/2020 01/02/2019 01/24/2018 01/13/2018 09/18/2017  PHQ - 2 Score 0 0 0 0 0 0  PHQ- 9 Score 0 - 1 - - -    Fall Risk Fall Risk  04/14/2020 05/11/2019 01/02/2019 01/13/2018  09/01/2014  Falls in the past year? 0 0 0 No No  Comment - Emmi Telephone Survey: data to providers prior to load - - -  Number falls in past yr: 0 - 0 - -  Injury with Fall? 0 - 0 - -  Risk for fall due to : No Fall Risks - - - -  Follow up Falls evaluation completed;Falls prevention discussed - - - -    FALL RISK PREVENTION PERTAINING TO THE HOME:  Any stairs in or around the home? Yes  If so, are there any without handrails? No  Home free of loose throw rugs in walkways, pet beds, electrical cords, etc? Yes  Adequate lighting in your home to reduce risk of falls? Yes   ASSISTIVE DEVICES UTILIZED TO PREVENT FALLS:  Life alert? No  Use of a cane, walker or w/c? No  Grab bars in the bathroom? Yes  Shower chair or bench in shower? Yes  Elevated toilet seat or a handicapped toilet? No   TIMED UP AND GO:  Was the test performed? Yes .  Length of time to ambulate 10 feet: 12 sec.   Gait steady and fast without use of assistive device  Cognitive Function: Normal cognitive status assessed by direct observation by this Nurse Health Advisor. No abnormalities found.   MMSE - Mini Mental State Exam 09/18/2017  Not completed: (No Data)        Immunizations Immunization History  Administered Date(s) Administered   Fluad Quad(high Dose 65+) 04/08/2020   Influenza Split 02/28/2011, 06/15/2012   Influenza Whole 03/27/2009, 07/19/2010   Influenza, High Dose Seasonal PF 07/20/2014, 02/24/2015, 04/04/2017, 03/27/2018, 02/20/2019   Influenza,inj,Quad PF,6+ Mos 06/26/2013   PFIZER(Purple Top)SARS-COV-2 Vaccination  07/03/2019, 08/03/2019   Pneumococcal Conjugate-13 01/09/2017   Pneumococcal Polysaccharide-23 03/27/2018   Tdap 09/22/2015   Zoster Recombinat (Shingrix) 02/20/2019, 05/01/2019    TDAP status: Up to date  Flu Vaccine status: Up to date  Pneumococcal vaccine status: Up to date  Covid-19 vaccine status: Completed vaccines  Qualifies for Shingles Vaccine? Yes   Zostavax completed Yes   Shingrix Completed?: Yes  Screening Tests Health Maintenance  Topic Date Due   Hepatitis C Screening  Never done   INFLUENZA VACCINE  01/16/2021   COLONOSCOPY (Pts 45-3yrs Insurance coverage will need to be confirmed)  10/06/2023   TETANUS/TDAP  09/21/2025   COVID-19 Vaccine  Completed   Zoster Vaccines- Shingrix  Completed   HPV VACCINES  Aged Out    Health Maintenance  Health Maintenance Due  Topic Date Due   Hepatitis C Screening  Never done   INFLUENZA VACCINE  01/16/2021    Colorectal cancer screening: Type of screening: Colonoscopy. Completed 0. Repeat every 10/07/2020 years3  Lung Cancer Screening: (Low Dose CT Chest recommended if Age 24-80 years, 30 pack-year currently smoking OR have quit w/in 15years.) does not qualify.   Lung Cancer Screening Referral: n/a  Additional Screening:  Hepatitis C Screening: does qualify;   Vision Screening: Recommended annual ophthalmology exams for early detection of glaucoma and other disorders of the eye. Is the patient up to date with their annual eye exam?  Yes  Who is the provider or what is the name of the office in which the patient attends annual eye exams? [facility]  If pt is not established with a provider, would they like to be referred to a provider to establish care? No .   Dental Screening: Recommended annual dental exams for proper oral hygiene  Community Resource Referral / Chronic Care Management: CRR required this visit?  No   CCM required this visit?  No      Plan:     I have personally reviewed and noted  the following in the patient's chart:   Medical and social history Use of alcohol, tobacco or illicit drugs  Current medications and supplements including opioid prescriptions. Patient is not currently taking opioid prescriptions. Functional ability and status Nutritional status Physical activity Advanced directives List of other physicians Hospitalizations, surgeries, and ER visits in previous 12 months Vitals Screenings to include cognitive, depression, and falls Referrals and appointments  In addition, I have reviewed and discussed with patient certain preventive protocols, quality metrics, and best practice recommendations. A written personalized care plan for preventive services as well as general preventive health recommendations were provided to patient.     Randel Pigg, LPN   [id number]   Nurse Notes: none

## 2021-03-10 ENCOUNTER — Other Ambulatory Visit: Payer: Self-pay | Admitting: Family Medicine

## 2021-04-04 ENCOUNTER — Other Ambulatory Visit: Payer: Self-pay | Admitting: Family Medicine

## 2021-04-04 ENCOUNTER — Telehealth: Payer: Self-pay | Admitting: Pharmacist

## 2021-04-04 NOTE — Chronic Care Management (AMB) (Addendum)
    Chronic Care Management Pharmacy Assistant   Name: Calvin [name]  [name]: [id number] DOB: 1945/07/24   04/04/21 APPOINTMENT REMINDER   Calvin [name] was reminded to have all medications, supplements and any blood glucose and blood pressure readings available for review with Calvin [name], Pharm. D, at his telephone visit on 04/05/21 at 2.    Care Gaps: AWV - 02/2021 BP - 138/68 Hepatitis C Screening - Overdue Flu Vaccine - Overdue  Star Rating Drug: Atorvastatin (Lipitor) 20 mg - Last filled 03/13/21 30 DS at [facility] Losartan (Cozaar) 100 mg - Last filled 03/13/21 30 DS at [facility]  Any gaps in medications fill history? None  Medications: Outpatient Encounter Medications as of 04/04/2021  Medication Sig   amLODipine (NORVASC) 5 MG tablet TAKE 1 TABLET BY MOUTH EVERY DAY   atorvastatin (LIPITOR) 20 MG tablet TAKE 1 TABLET (20 MG TOTAL) BY MOUTH DAILY. *APPOINTMENT WITH LABS NEEDED FOR FUTURE REFILLS*   Coenzyme Q10 (CO Q-10) 100 MG CAPS Take by mouth daily. (Patient not taking: Reported on 03/08/2021)   diclofenac sodium (VOLTAREN) 1 % GEL Apply topically to affected area qid (Patient not taking: Reported on 03/08/2021)   finasteride (PROSCAR) 5 MG tablet TAKE 1 TABLET BY MOUTH EVERY DAY   glucosamine-chondroitin 500-400 MG tablet Take 1 tablet by mouth daily. (Patient not taking: Reported on 03/08/2021)   hydrochlorothiazide (HYDRODIURIL) 25 MG tablet TAKE 1 TABLET BY MOUTH EVERY DAY   losartan (COZAAR) 100 MG tablet TAKE 1 TABLET BY MOUTH EVERY DAY   sildenafil (VIAGRA) 100 MG tablet Take 1 tablet (100 mg total) by mouth daily as needed for erectile dysfunction. (Patient not taking: Reported on 03/08/2021)   tamsulosin (FLOMAX) 0.4 MG CAPS capsule TAKE 1 CAPSULE BY MOUTH EVERY DAY **APPOINTMENT NEEDED FOR FURTURE REFILLS**   No facility-administered encounter medications on file as of 04/04/2021.    [place] Clinical Pharmacist Assistant [phone]

## 2021-04-05 ENCOUNTER — Ambulatory Visit (INDEPENDENT_AMBULATORY_CARE_PROVIDER_SITE_OTHER): Payer: Medicare HMO | Admitting: Pharmacist

## 2021-04-05 DIAGNOSIS — I10 Essential (primary) hypertension: Secondary | ICD-10-CM

## 2021-04-05 DIAGNOSIS — F5101 Primary insomnia: Secondary | ICD-10-CM

## 2021-04-05 NOTE — Progress Notes (Signed)
Chronic Care Management Pharmacy Note  04/17/2021 Name:  ANGLE KAREL [name]:  [id number] DOB:  1946-02-25  Summary: BP is at goal < 130/80 LDL is at goal < 100  Recommendations/Changes made from today's visit: -Recommended use of Claritin or Zyrtec for allergies and avoidance of products containing phenylephrine  Plan: Follow up BP assessment in 3-4 months   Subjective: Calvin [name] is an 75 y.o. year old male who is a primary patient of Laurey Morale, MD.  The CCM team was consulted for assistance with disease management and care coordination needs.    Engaged with patient by telephone for follow up visit in response to provider referral for pharmacy case management and/or care coordination services.   Consent to Services:  The patient was given information about Chronic Care Management services, agreed to services, and gave verbal consent prior to initiation of services.  Please see initial visit note for detailed documentation.   Patient Care Team: Laurey Morale, MD as PCP - General Viona Gilmore, [facility] as Pharmacist (Pharmacist)  Recent office visits: 03/08/21 Randel Pigg, LPN: Patient presented for AWV.  Recent consult visits: 01/10/21 Video Visit Laurey Morale, MD - Tele health for viral illness. Prescribed Molnuprirvir 800 mg twice daily.  Hospital visits: None in previous 6 months   Objective:  Lab Results  Component Value Date   CREATININE 0.84 01/05/2020   BUN 14 01/05/2020   GFR 87.54 01/02/2019   GFRNONAA >60 01/21/2016   GFRAA >60 01/21/2016   NA 140 01/05/2020   K 3.7 01/05/2020   CALCIUM 9.5 01/05/2020   CO2 23 01/05/2020   GLUCOSE 99 01/05/2020    Lab Results  Component Value Date/Time   GFR 87.54 01/02/2019 12:32 PM   GFR 99.98 01/13/2018 03:08 PM    Last diabetic Eye exam: No results found for: HMDIABEYEEXA  Last diabetic Foot exam: No results found for: HMDIABFOOTEX   Lab Results  Component Value Date   CHOL 149 01/05/2020    HDL 61 01/05/2020   LDLCALC 67 01/05/2020   LDLDIRECT 157.1 02/16/2011   TRIG 126 01/05/2020   CHOLHDL 2.4 01/05/2020    Hepatic Function Latest Ref Rng & Units 01/05/2020 01/02/2019 01/13/2018  Total Protein 6.1 - 8.1 g/dL 7.3 7.2 7.1  Albumin 3.5 - 5.2 g/dL - 4.7 4.4  AST 10 - 35 U/L 24 22 40(H)  ALT 9 - 46 U/L 45 29 74(H)  Alk Phosphatase 39 - 117 U/L - 60 51  Total Bilirubin 0.2 - 1.2 mg/dL 1.0 1.1 0.7  Bilirubin, Direct 0.0 - 0.2 mg/dL 0.2 0.2 0.2    Lab Results  Component Value Date/Time   TSH 3.96 01/05/2020 10:47 AM   TSH 2.22 01/02/2019 12:32 PM    CBC Latest Ref Rng & Units 01/05/2020 01/02/2019 01/13/2018  WBC 3.8 - 10.8 Thousand/uL 5.9 5.4 5.3  Hemoglobin 13.2 - 17.1 g/dL 15.5 14.9 15.2  Hematocrit 38.5 - 50.0 % 44.8 42.8 42.8  Platelets 140 - 400 Thousand/uL 246 246.0 261.0    No results found for: VD25OH  Clinical ASCVD: No  The 10-year ASCVD risk score (Arnett DK, et al., 2019) is: 23.7%   Values used to calculate the score:     Age: 69 years     Sex: Male     Is Non-Hispanic African American: No     Diabetic: No     Tobacco smoker: No     Systolic Blood Pressure: 827 mmHg  Is BP treated: Yes     HDL Cholesterol: 61 mg/dL     Total Cholesterol: 149 mg/dL    Depression screen [facility] 2/9 04/14/2020 02/24/2020 01/02/2019  Decreased Interest 0 0 0  Down, Depressed, Hopeless 0 0 0  PHQ - 2 Score 0 0 0  Altered sleeping 0 - 1  Tired, decreased energy 0 - 0  Change in appetite 0 - 0  Feeling bad or failure about yourself  0 - 0  Trouble concentrating 0 - 0  Moving slowly or fidgety/restless 0 - 0  Suicidal thoughts 0 - 0  PHQ-9 Score 0 - 1  Difficult doing work/chores Not difficult at all - Not difficult at all      Social History   Tobacco Use  Smoking Status Never  Smokeless Tobacco Never   BP Readings from Last 3 Encounters:  03/08/21 138/68  10/05/20 115/72  09/14/20 120/80   Pulse Readings from Last 3 Encounters:  03/08/21 100  10/05/20 89   09/14/20 96   Wt Readings from Last 3 Encounters:  03/08/21 244 lb (110.7 kg)  10/05/20 238 lb (108 kg)  09/21/20 238 lb (108 kg)   BMI Readings from Last 3 Encounters:  03/08/21 33.09 kg/m  10/05/20 33.19 kg/m  09/21/20 33.19 kg/m    Assessment/Interventions: Review of patient past medical history, allergies, medications, health status, including review of consultants reports, laboratory and other test data, was performed as part of comprehensive evaluation and provision of chronic care management services.   SDOH:  (Social Determinants of Health) assessments and interventions performed: No  SDOH Screenings   Alcohol Screen: Not on file  Depression (PHQ2-9): Not on file  Financial Resource Strain: Not on file  Food Insecurity: Not on file  Housing: Ruth Score: 0  Physical Activity: Inactive   Days of Exercise per Week: 0 days   Minutes of Exercise per Session: 0 min  Social Connections: Socially Isolated   Frequency of Communication with Friends and Family: Once a week   Frequency of Social Gatherings with Friends and Family: Once a week   Attends Religious Services: Never   Marine scientist or Organizations: No   Attends Archivist Meetings: Never   Marital Status: Widowed  Stress: Not on file  Tobacco Use: Low Risk    Smoking Tobacco Use: Never   Smokeless Tobacco Use: Never   Passive Exposure: Not on file  Transportation Needs: No Transportation Needs   Lack of Transportation (Medical): No   Lack of Transportation (Non-Medical): No    CCM Care Plan  No Known Allergies  Medications Reviewed Today     Reviewed by Viona Gilmore, [facility] (Pharmacist) on 04/05/21 at 1430  Med List Status: <None>   Medication Order Taking? Sig Documenting Provider Last Dose Status Informant  amLODipine (NORVASC) 5 MG tablet [id number] Yes TAKE 1 TABLET BY MOUTH EVERY DAY Laurey Morale, MD Taking Active   atorvastatin (LIPITOR) 20 MG  tablet [id number] Yes TAKE 1 TABLET (20 MG TOTAL) BY MOUTH DAILY. *APPOINTMENT WITH LABS NEEDED FOR FUTURE REFILLS* Laurey Morale, MD Taking Active   cholecalciferol (VITAMIN D3) 25 MCG (1000 UNIT) tablet [id number] Yes Take 1,000 Units by mouth daily. [provider] Taking Active   finasteride (PROSCAR) 5 MG tablet [id number] Yes TAKE 1 TABLET BY MOUTH EVERY DAY Laurey Morale, MD Taking Active   hydrochlorothiazide (HYDRODIURIL) 25 MG tablet [id number] Yes TAKE 1 TABLET BY  MOUTH EVERY DAY Laurey Morale, MD Taking Active   losartan (COZAAR) 100 MG tablet [id number] Yes TAKE 1 TABLET BY MOUTH EVERY DAY Laurey Morale, MD Taking Active   sildenafil (VIAGRA) 100 MG tablet [id number]  Take 1 tablet (100 mg total) by mouth daily as needed for erectile dysfunction.  Patient not taking: Reported on 03/08/2021   Laurey Morale, MD  Active   tamsulosin (FLOMAX) 0.4 MG CAPS capsule [id number] Yes TAKE 1 CAPSULE BY MOUTH EVERY DAY **APPOINTMENT NEEDED FOR FURTURE REFILLS** Laurey Morale, MD Taking Active   vitamin C (ASCORBIC ACID) 500 MG tablet [id number] Yes Take 500 mg by mouth daily. [provider] Taking Active   zinc gluconate 50 MG tablet [id number] Yes Take 50 mg by mouth daily. [provider] Taking Active             Patient Active Problem List   Diagnosis Date Noted   Low back pain 06/09/2020   COVID-19 virus infection 02/24/2020   Sprain of index finger, initial encounter 04/23/2018   Primary osteoarthritis of left knee 04/23/2018   Urinary tract infection, site not specified 01/30/2016   Orchitis 01/30/2016   Epididymitis 01/19/2016   Hypogonadism in male 01/19/2016   Foot swelling 01/19/2016   Testicular pain, right    Depression 12/08/2014   Insomnia 10/30/2013   INTERNAL HEMORRHOIDS WITH OTHER COMPLICATION 77/82/4235   BPH (benign prostatic hyperplasia) 10/25/2008   NEPHROLITHIASIS, HX OF 10/25/2008   Headache 09/15/2007   Essential hypertension  07/07/2007    Immunization History  Administered Date(s) Administered   Fluad Quad(high Dose 65+) 04/08/2020   Influenza Split 02/28/2011, 06/15/2012   Influenza Whole 03/27/2009, 07/19/2010   Influenza, High Dose Seasonal PF 07/20/2014, 02/24/2015, 04/04/2017, 03/27/2018, 02/20/2019   Influenza,inj,Quad PF,6+ Mos 06/26/2013   PFIZER(Purple Top)SARS-COV-2 Vaccination 07/03/2019, 08/03/2019, 05/26/2020, 10/11/2020   Pneumococcal Conjugate-13 01/09/2017   Pneumococcal Polysaccharide-23 03/27/2018   Tdap 09/22/2015   Zoster Recombinat (Shingrix) 02/20/2019, 05/01/2019   BP Readings from Last 3 Encounters:  03/08/21 138/68  10/05/20 115/72  09/14/20 120/80    Conditions to be addressed/monitored:  Hypertension, Hyperlipidemia, Depression, Osteoarthritis, BPH, and Insomnia  Conditions addressed this visit: Hypertension, insomnia  Care Plan : [place]  Updates made by Viona Gilmore, [place] since 04/17/2021 12:00 AM     Problem: Problem: Hypertension, Hyperlipidemia, Depression, Osteoarthritis, BPH, and Insomnia      Long-Range Goal: Patient-Specific Goal   Start Date: 03/29/2021  Expected End Date: 03/29/2022  This Visit's Progress: On track  Priority: High  Note:   Current Barriers:  Unable to independently monitor therapeutic efficacy  Pharmacist Clinical Goal(s):  Patient will achieve adherence to monitoring guidelines and medication adherence to achieve therapeutic efficacy through collaboration with PharmD and provider.   Interventions: 1:1 collaboration with Laurey Morale, MD regarding development and update of comprehensive plan of care as evidenced by provider attestation and co-signature Inter-disciplinary care team collaboration (see longitudinal plan of care) Comprehensive medication review performed; medication list updated in electronic medical record  Hypertension (BP goal <140/90) -Controlled -Current treatment: Amlodipine 5 mg daily   HCTZ 25 mg daily (midnight)  Losartan 100 mg daily  -Medications previously tried: none  -Current home readings: 127/81, 117/73, 137/80, 155/98 (highest it's been), 148/92  -Current dietary habits: did not discuss -Current exercise habits: did not discuss -Denies hypotensive/hypertensive symptoms -Educated on Exercise goal of 150 minutes per week; Importance of home blood pressure monitoring; Proper BP monitoring technique; -Counseled to  monitor BP at home weekly, document, and provide log at future appointments -Counseled on diet and exercise extensively Recommended to continue current medication Counseled on diuretic nature of HCTZ and consider switching to AM administration.  Hyperlipidemia: (LDL goal < 100) -Controlled -Current treatment: Atorvastatin 20 mg daily -Medications previously tried: none  -Current dietary patterns: did not discuss -Current exercise habits: did not discuss -Educated on Cholesterol goals;  Benefits of statin for ASCVD risk reduction; Exercise goal of 150 minutes per week; -Counseled on diet and exercise extensively Recommended to continue current medication Recommended repeat lipid panel  BPH (Goal: minimize symptoms) -Controlled -Current treatment  Finasteride 5 mg daily  Tamsulosin 0.4 mg daily  -Medications previously tried: none  -Recommended to continue current medication  Osteoarthritis (Goal: minimize pain) -Controlled -Current treatment  Synvisc injections -Medications previously tried: glucosamine  -Recommended to continue current medication  Insomnia (Goal: improve quality and quantity of sleep) -Uncontrolled -Current treatment  Melatonin - not helping -Medications previously tried: none  -Counseled on practicing good sleep hygiene by setting a sleep schedule and maintaining it, avoid excessive napping, following a nightly routine, avoiding screen time for 30-60 minutes before going to bed, and making the bedroom a cool, quiet  and dark space  Health Maintenance -Vaccine gaps: COVID booster, influenza -Current therapy:  Vitamin C daily Vitamin B daily Zinc daily -Educated on Cost vs benefit of each product must be carefully weighed by individual consumer -Patient is satisfied with current therapy and denies issues -Recommended to continue current medication  Patient Goals/Self-Care Activities Patient will:  - take medications as prescribed as evidenced by patient report and record review check blood pressure weekly, document, and provide at future appointments target a minimum of 150 minutes of moderate intensity exercise weekly  Follow Up Plan: Telephone follow up appointment with care management team member scheduled for: 1 year      Medication Assistance: None required.  Patient affirms current coverage meets needs.  Compliance/Adherence/Medication fill history: Care Gaps: AWV - 02/2021 BP - 138/68 Hepatitis C Screening - Overdue Flu Vaccine - Overdue  Star-Rating Drugs: Atorvastatin (Lipitor) 20 mg - [facility] Losartan (Cozaar) 100 mg - Last filled [facility]/13/21 [facility]  [facility]  [id number] [place] [address] [id number]: [phone]: [phone] Uses pill box? Yes Pt endorses 90% compliance  We discussed: Current pharmacy is preferred with insurance plan and patient is satisfied with pharmacy services Patient decided to: Continue current medication management strategy  Care Plan and Follow Up Patient Decision:  Patient agrees to Care Plan and Follow-up.  Plan: Telephone follow up appointment with care management team member scheduled for:  1 year  MJeni Salles PharmD, BOkahumpkaPharmacist [place]ryantat [phone]

## 2021-04-17 DIAGNOSIS — I10 Essential (primary) hypertension: Secondary | ICD-10-CM

## 2021-04-20 DIAGNOSIS — H43811 Vitreous degeneration, right eye: Secondary | ICD-10-CM | POA: Diagnosis not present

## 2021-04-20 DIAGNOSIS — Z0100 Encounter for examination of eyes and vision without abnormal findings: Secondary | ICD-10-CM | POA: Diagnosis not present

## 2021-04-20 DIAGNOSIS — H524 Presbyopia: Secondary | ICD-10-CM | POA: Diagnosis not present

## 2021-04-25 ENCOUNTER — Ambulatory Visit (INDEPENDENT_AMBULATORY_CARE_PROVIDER_SITE_OTHER): Payer: Medicare HMO | Admitting: Family Medicine

## 2021-04-25 ENCOUNTER — Encounter: Payer: Self-pay | Admitting: Family Medicine

## 2021-04-25 ENCOUNTER — Other Ambulatory Visit: Payer: Self-pay

## 2021-04-25 VITALS — BP 120/78 | HR 94 | Temp 97.9°F | Ht 72.0 in | Wt 244.0 lb

## 2021-04-25 DIAGNOSIS — N138 Other obstructive and reflux uropathy: Secondary | ICD-10-CM

## 2021-04-25 DIAGNOSIS — R739 Hyperglycemia, unspecified: Secondary | ICD-10-CM

## 2021-04-25 DIAGNOSIS — R69 Illness, unspecified: Secondary | ICD-10-CM | POA: Diagnosis not present

## 2021-04-25 DIAGNOSIS — I10 Essential (primary) hypertension: Secondary | ICD-10-CM | POA: Diagnosis not present

## 2021-04-25 DIAGNOSIS — N401 Enlarged prostate with lower urinary tract symptoms: Secondary | ICD-10-CM | POA: Diagnosis not present

## 2021-04-25 DIAGNOSIS — F5101 Primary insomnia: Secondary | ICD-10-CM | POA: Diagnosis not present

## 2021-04-25 DIAGNOSIS — Z23 Encounter for immunization: Secondary | ICD-10-CM

## 2021-04-25 LAB — BASIC METABOLIC PANEL
BUN: 15 mg/dL (ref 6–23)
CO2: 26 mEq/L (ref 19–32)
Calcium: 9.6 mg/dL (ref 8.4–10.5)
Chloride: 97 mEq/L (ref 96–112)
Creatinine, Ser: 0.75 mg/dL (ref 0.40–1.50)
GFR: 89.34 mL/min (ref >60.00)
Glucose, Bld: 105 mg/dL — ABNORMAL HIGH (ref 70–99)
Potassium: 3.5 mEq/L (ref 3.5–5.1)
Sodium: 135 mEq/L (ref 135–145)

## 2021-04-25 LAB — CBC WITH DIFFERENTIAL/PLATELET
Basophils Absolute: 0 10*3/uL (ref 0.0–0.1)
Basophils Relative: 0.5 % (ref 0.0–3.0)
Eosinophils Absolute: 0.2 10*3/uL (ref 0.0–0.7)
Eosinophils Relative: 2.8 % (ref 0.0–5.0)
HCT: 43.2 % (ref 39.0–52.0)
Hemoglobin: 14.9 g/dL (ref 13.0–17.0)
Lymphocytes Relative: 34.9 % (ref 12.0–46.0)
Lymphs Abs: 2.3 10*3/uL (ref 0.7–4.0)
MCHC: 34.4 g/dL (ref 30.0–36.0)
MCV: 101.2 fl — ABNORMAL HIGH (ref 78.0–100.0)
Monocytes Absolute: 0.8 10*3/uL (ref 0.1–1.0)
Monocytes Relative: 12.2 % — ABNORMAL HIGH (ref 3.0–12.0)
Neutro Abs: 3.3 10*3/uL (ref 1.4–7.7)
Neutrophils Relative %: 49.6 % (ref 43.0–77.0)
Platelets: 224 10*3/uL (ref 150.0–400.0)
RBC: 4.27 Mil/uL (ref 4.22–5.81)
RDW: 12.8 % (ref 11.5–15.5)
WBC: 6.6 10*3/uL (ref 4.0–10.5)

## 2021-04-25 LAB — LIPID PANEL
Cholesterol: 143 mg/dL (ref 0–200)
HDL: 54.9 mg/dL (ref >39.00)
LDL Cholesterol: 60 mg/dL (ref 0–99)
NonHDL: 88.41
Total CHOL/HDL Ratio: 3
Triglycerides: 144 mg/dL (ref 0.0–149.0)
VLDL: 28.8 mg/dL (ref 0.0–40.0)

## 2021-04-25 LAB — HEPATIC FUNCTION PANEL
ALT: 49 U/L (ref 0–53)
AST: 30 U/L (ref 0–37)
Albumin: 4.6 g/dL (ref 3.5–5.2)
Alkaline Phosphatase: 83 U/L (ref 39–117)
Bilirubin, Direct: 0.3 mg/dL (ref 0.0–0.3)
Total Bilirubin: 1.3 mg/dL — ABNORMAL HIGH (ref 0.2–1.2)
Total Protein: 7.3 g/dL (ref 6.0–8.3)

## 2021-04-25 LAB — TSH: TSH: 4.1 u[IU]/mL (ref 0.35–5.50)

## 2021-04-25 LAB — PSA: PSA: 0.93 ng/mL (ref 0.10–4.00)

## 2021-04-25 LAB — HEMOGLOBIN A1C: Hgb A1c MFr Bld: 5.7 % (ref 4.6–6.5)

## 2021-04-25 MED ORDER — LOSARTAN POTASSIUM 100 MG PO TABS: 100.0000 mg | ORAL_TABLET | Freq: Every day | ORAL | 3 refills | Status: DC

## 2021-04-25 MED ORDER — TAMSULOSIN HCL 0.4 MG PO CAPS: 0.4000 mg | ORAL_CAPSULE | Freq: Every day | ORAL | 3 refills | Status: DC

## 2021-04-25 MED ORDER — FINASTERIDE 5 MG PO TABS: 5.0000 mg | ORAL_TABLET | Freq: Every day | ORAL | 3 refills | Status: DC

## 2021-04-25 MED ORDER — AMLODIPINE BESYLATE 5 MG PO TABS: 5.0000 mg | ORAL_TABLET | Freq: Every day | ORAL | 3 refills | Status: DC

## 2021-04-25 MED ORDER — ATORVASTATIN CALCIUM 20 MG PO TABS: 20.0000 mg | ORAL_TABLET | Freq: Every day | ORAL | 3 refills | Status: DC

## 2021-04-25 NOTE — Addendum Note (Signed)
Addended by: Amanda Cockayne on: 04/25/2021 11:28 AM   Modules accepted: Orders

## 2021-04-25 NOTE — Progress Notes (Signed)
Subjective:    Patient ID: Calvin [name], male    DOB: Oct 18, 1945, 75 y.o.   [name]: [id number]  HPI Here to follow up on issues. His BP has been stable. His OA is stable. He was able to lose some weight, but he has gained this all back. He does go to the gym several days a week. His sleep patterns are stable.    Review of Systems  Constitutional: Negative.   HENT: Negative.    Eyes: Negative.   Respiratory: Negative.    Cardiovascular: Negative.   Gastrointestinal: Negative.   Genitourinary: Negative.   Musculoskeletal: Negative.   Skin: Negative.   Neurological: Negative.   Psychiatric/Behavioral: Negative.        Objective:   Physical Exam Constitutional:      General: He is not in acute distress.    Appearance: He is well-developed. He is obese. He is not diaphoretic.  HENT:     Head: Normocephalic and atraumatic.     Right Ear: External ear normal.     Left Ear: External ear normal.     Nose: Nose normal.     Mouth/Throat:     Pharynx: No oropharyngeal exudate.  Eyes:     General: No scleral icterus.       Right eye: No discharge.        Left eye: No discharge.     Conjunctiva/sclera: Conjunctivae normal.     Pupils: Pupils are equal, round, and reactive to light.  Neck:     Thyroid: No thyromegaly.     Vascular: No JVD.     Trachea: No tracheal deviation.  Cardiovascular:     Rate and Rhythm: Normal rate and regular rhythm.     Heart sounds: Normal heart sounds. No murmur heard.   No friction rub. No gallop.  Pulmonary:     Effort: Pulmonary effort is normal. No respiratory distress.     Breath sounds: Normal breath sounds. No wheezing or rales.  Chest:     Chest wall: No tenderness.  Abdominal:     General: Bowel sounds are normal. There is no distension.     Palpations: Abdomen is soft. There is no mass.     Tenderness: There is no abdominal tenderness. There is no guarding or rebound.  Genitourinary:    Penis: Normal. No tenderness.      Testes:  Normal.     Prostate: Normal.     Rectum: Normal. Guaiac result negative.  Musculoskeletal:        General: No tenderness. Normal range of motion.     Cervical back: Neck supple.  Lymphadenopathy:     Cervical: No cervical adenopathy.  Skin:    General: Skin is warm and dry.     Coloration: Skin is not pale.     Findings: No erythema or rash.  Neurological:     Mental Status: He is alert and oriented to person, place, and time.     Cranial Nerves: No cranial nerve deficit.     Motor: No abnormal muscle tone.     Coordination: Coordination normal.     Deep Tendon Reflexes: Reflexes are normal and symmetric. Reflexes normal.  Psychiatric:        Behavior: Behavior normal.        Thought Content: Thought content normal.        Judgment: Judgment normal.          Assessment & Plan:  His HTN is well controlled. His  OA and BPH are stable. His insomnia is stable. We will get fasting labs to check lipids, A1c, etc. I encouraged him to try to lose some weight again. We spent a total of ( 32  ) minutes reviewing records and discussing these issues.  Alysia Penna, MD

## 2021-04-25 NOTE — Addendum Note (Signed)
Addended by: Wyvonne Lenz on: 04/25/2021 11:28 AM   Modules accepted: Orders

## 2021-05-15 DIAGNOSIS — Z23 Encounter for immunization: Secondary | ICD-10-CM | POA: Diagnosis not present

## 2021-05-24 DIAGNOSIS — L538 Other specified erythematous conditions: Secondary | ICD-10-CM | POA: Diagnosis not present

## 2021-05-24 DIAGNOSIS — Z85828 Personal history of other malignant neoplasm of skin: Secondary | ICD-10-CM | POA: Diagnosis not present

## 2021-05-24 DIAGNOSIS — L821 Other seborrheic keratosis: Secondary | ICD-10-CM | POA: Diagnosis not present

## 2021-05-24 DIAGNOSIS — Z789 Other specified health status: Secondary | ICD-10-CM | POA: Diagnosis not present

## 2021-05-24 DIAGNOSIS — R208 Other disturbances of skin sensation: Secondary | ICD-10-CM | POA: Diagnosis not present

## 2021-05-24 DIAGNOSIS — L298 Other pruritus: Secondary | ICD-10-CM | POA: Diagnosis not present

## 2021-05-24 DIAGNOSIS — C44329 Squamous cell carcinoma of skin of other parts of face: Secondary | ICD-10-CM | POA: Diagnosis not present

## 2021-05-24 DIAGNOSIS — L820 Inflamed seborrheic keratosis: Secondary | ICD-10-CM | POA: Diagnosis not present

## 2021-05-24 DIAGNOSIS — C4442 Squamous cell carcinoma of skin of scalp and neck: Secondary | ICD-10-CM | POA: Diagnosis not present

## 2021-05-24 DIAGNOSIS — D485 Neoplasm of uncertain behavior of skin: Secondary | ICD-10-CM | POA: Diagnosis not present

## 2021-05-24 DIAGNOSIS — C44622 Squamous cell carcinoma of skin of right upper limb, including shoulder: Secondary | ICD-10-CM | POA: Diagnosis not present

## 2021-05-24 DIAGNOSIS — Z08 Encounter for follow-up examination after completed treatment for malignant neoplasm: Secondary | ICD-10-CM | POA: Diagnosis not present

## 2021-05-24 DIAGNOSIS — R229 Localized swelling, mass and lump, unspecified: Secondary | ICD-10-CM | POA: Diagnosis not present

## 2021-06-29 DIAGNOSIS — D049 Carcinoma in situ of skin, unspecified: Secondary | ICD-10-CM | POA: Diagnosis not present

## 2021-06-29 DIAGNOSIS — D0439 Carcinoma in situ of skin of other parts of face: Secondary | ICD-10-CM | POA: Diagnosis not present

## 2021-06-29 DIAGNOSIS — Z481 Encounter for planned postprocedural wound closure: Secondary | ICD-10-CM | POA: Diagnosis not present

## 2021-07-08 ENCOUNTER — Other Ambulatory Visit: Payer: Self-pay | Admitting: Family Medicine

## 2021-07-19 ENCOUNTER — Ambulatory Visit (INDEPENDENT_AMBULATORY_CARE_PROVIDER_SITE_OTHER): Payer: Medicare HMO | Admitting: Family Medicine

## 2021-07-19 ENCOUNTER — Encounter: Payer: Self-pay | Admitting: Family Medicine

## 2021-07-19 VITALS — BP 126/80 | HR 92 | Temp 98.6°F | Wt 244.0 lb

## 2021-07-19 DIAGNOSIS — R55 Syncope and collapse: Secondary | ICD-10-CM | POA: Diagnosis not present

## 2021-07-19 DIAGNOSIS — K7689 Other specified diseases of liver: Secondary | ICD-10-CM

## 2021-07-19 LAB — CBC WITH DIFFERENTIAL/PLATELET
Basophils Absolute: 0 10*3/uL (ref 0.0–0.1)
Basophils Relative: 0.5 % (ref 0.0–3.0)
Eosinophils Absolute: 0.3 10*3/uL (ref 0.0–0.7)
Eosinophils Relative: 4.1 % (ref 0.0–5.0)
HCT: 42.7 % (ref 39.0–52.0)
Hemoglobin: 14.6 g/dL (ref 13.0–17.0)
Lymphocytes Relative: 28.4 % (ref 12.0–46.0)
Lymphs Abs: 2 10*3/uL (ref 0.7–4.0)
MCHC: 34.3 g/dL (ref 30.0–36.0)
MCV: 100 fl (ref 78.0–100.0)
Monocytes Absolute: 0.9 10*3/uL (ref 0.1–1.0)
Monocytes Relative: 12.4 % — ABNORMAL HIGH (ref 3.0–12.0)
Neutro Abs: 3.9 10*3/uL (ref 1.4–7.7)
Neutrophils Relative %: 54.6 % (ref 43.0–77.0)
Platelets: 264 10*3/uL (ref 150.0–400.0)
RBC: 4.27 Mil/uL (ref 4.22–5.81)
RDW: 13 % (ref 11.5–15.5)
WBC: 7.2 10*3/uL (ref 4.0–10.5)

## 2021-07-19 LAB — BASIC METABOLIC PANEL
BUN: 11 mg/dL (ref 6–23)
CO2: 27 mEq/L (ref 19–32)
Calcium: 9.5 mg/dL (ref 8.4–10.5)
Chloride: 102 mEq/L (ref 96–112)
Creatinine, Ser: 0.76 mg/dL (ref 0.40–1.50)
GFR: 88.84 mL/min (ref >60.00)
Glucose, Bld: 99 mg/dL (ref 70–99)
Potassium: 3.3 mEq/L — ABNORMAL LOW (ref 3.5–5.1)
Sodium: 138 mEq/L (ref 135–145)

## 2021-07-19 LAB — TSH: TSH: 3.52 u[IU]/mL (ref 0.35–5.50)

## 2021-07-19 NOTE — Progress Notes (Signed)
° °  Subjective:    Patient ID: Calvin [name], male    DOB: [birth year], 76 y.o.   [name]: [id number]  HPI Here for an episode of syncope that occurred 2 days ago. That day he had been out running errands and he drove home and parked in his driveway. This was about 1:00 PM. He remembers parking but not getting out of the car. Then he woke up lying in his driveway an unknown length of time later. When he woke up he felt somewhat "out of it" though he had no obvious injuries. No evidence of head trauma. He quickly felt back to normal and he has felt fine ever since. No chest pain or SOB or palpitations. No lightheadedness or dizziness or headaches. Today he feels fine.    Review of Systems  Constitutional: Negative.   Respiratory: Negative.    Cardiovascular: Negative.   Gastrointestinal: Negative.   Genitourinary: Negative.   Neurological:  Positive for syncope. Negative for dizziness, tremors, seizures, facial asymmetry, speech difficulty, weakness, light-headedness, numbness and headaches.      Objective:   Physical Exam Constitutional:      Appearance: Normal appearance. He is not ill-appearing.  Cardiovascular:     Rate and Rhythm: Normal rate and regular rhythm.     Pulses: Normal pulses.     Heart sounds: Normal heart sounds.     Comments: EKG is normal except for occasional PVC's  Pulmonary:     Effort: Pulmonary effort is normal.     Breath sounds: Normal breath sounds.  Abdominal:     General: Abdomen is flat. Bowel sounds are normal. There is no distension.     Palpations: Abdomen is soft. There is no mass.     Tenderness: There is no abdominal tenderness. There is no guarding or rebound.     Hernia: No hernia is present.  Lymphadenopathy:     Cervical: No cervical adenopathy.  Neurological:     General: No focal deficit present.     Mental Status: He is alert and oriented to person, place, and time. Mental status is at baseline.     Cranial Nerves: No cranial nerve deficit.      Motor: No weakness.     Coordination: Coordination normal.     Gait: Gait normal.          Assessment & Plan:  Syncopal episode, etiology is not clear. We will check some labs today. We will also set up an ECHO and a brain MRI in the near future to rule out possibilities. He will eat regular meals and stay hydrated in the meantime. We spent a total of ( 34  ) minutes reviewing records and discussing these issues.  Alysia Penna, MD

## 2021-07-20 ENCOUNTER — Other Ambulatory Visit: Payer: Self-pay

## 2021-07-20 ENCOUNTER — Ambulatory Visit (HOSPITAL_COMMUNITY): Payer: Medicare HMO | Attending: Family Medicine

## 2021-07-20 DIAGNOSIS — R55 Syncope and collapse: Secondary | ICD-10-CM

## 2021-07-20 DIAGNOSIS — E876 Hypokalemia: Secondary | ICD-10-CM

## 2021-07-20 LAB — ECHOCARDIOGRAM COMPLETE
Area-P 1/2: 3.72 cm2
S' Lateral: 3.2 cm

## 2021-07-20 MED ORDER — POTASSIUM CHLORIDE CRYS ER 10 MEQ PO TBCR: 10.0000 meq | EXTENDED_RELEASE_TABLET | Freq: Two times a day (BID) | ORAL | 3 refills | Status: DC

## 2021-07-20 MED ORDER — POTASSIUM CHLORIDE ER 10 MEQ PO TBCR: 10.0000 meq | EXTENDED_RELEASE_TABLET | Freq: Every day | ORAL | 3 refills | Status: DC

## 2021-07-21 NOTE — Addendum Note (Signed)
Addended by: Alysia Penna A on: 07/21/2021 07:51 AM   Modules accepted: Orders

## 2021-07-27 DIAGNOSIS — C4442 Squamous cell carcinoma of skin of scalp and neck: Secondary | ICD-10-CM | POA: Diagnosis not present

## 2021-07-28 ENCOUNTER — Telehealth: Payer: Self-pay | Admitting: Family Medicine

## 2021-07-28 MED ORDER — DIAZEPAM 5 MG PO TABS: ORAL_TABLET | ORAL | 0 refills | Status: DC

## 2021-07-28 NOTE — Telephone Encounter (Signed)
Pt is having an MRI tomorrow (07/29/21).  Pt is clausterphobic so they suggested he gets some medication from his pcp to help get through the MRI.  Patient is requesting a call back.   [facility] [address]

## 2021-07-28 NOTE — Telephone Encounter (Signed)
Please advise 

## 2021-07-28 NOTE — Telephone Encounter (Signed)
I sent in some Valium that he can take prior to the scan. Make sure that someone can drive him there

## 2021-07-28 NOTE — Telephone Encounter (Signed)
Spoke with pt aware that new Rx for Valium was sent to his pharmacy

## 2021-07-29 ENCOUNTER — Other Ambulatory Visit: Payer: Self-pay

## 2021-07-29 ENCOUNTER — Ambulatory Visit
Admission: RE | Admit: 2021-07-29 | Discharge: 2021-07-29 | Disposition: A | Discharge status: Home / Self Care | Payer: Medicare HMO | Source: Ambulatory Visit | Attending: Family Medicine | Admitting: Family Medicine

## 2021-07-29 DIAGNOSIS — I639 Cerebral infarction, unspecified: Secondary | ICD-10-CM | POA: Diagnosis not present

## 2021-07-29 DIAGNOSIS — G319 Degenerative disease of nervous system, unspecified: Secondary | ICD-10-CM | POA: Diagnosis not present

## 2021-07-29 DIAGNOSIS — J323 Chronic sphenoidal sinusitis: Secondary | ICD-10-CM | POA: Diagnosis not present

## 2021-07-29 DIAGNOSIS — R55 Syncope and collapse: Secondary | ICD-10-CM | POA: Diagnosis not present

## 2021-07-29 MED ORDER — GADOBENATE DIMEGLUMINE 529 MG/ML IV SOLN
20.0000 mL | Freq: Once | INTRAVENOUS | Status: AC | PRN
  Administered 2021-07-29: 20 mL via INTRAVENOUS

## 2021-07-31 ENCOUNTER — Ambulatory Visit
Admission: RE | Admit: 2021-07-31 | Discharge: 2021-07-31 | Disposition: A | Discharge status: Home / Self Care | Payer: Medicare HMO | Source: Ambulatory Visit | Attending: Family Medicine | Admitting: Family Medicine

## 2021-07-31 DIAGNOSIS — K7689 Other specified diseases of liver: Secondary | ICD-10-CM

## 2021-08-02 ENCOUNTER — Telehealth: Payer: Self-pay | Admitting: Pharmacist

## 2021-08-02 NOTE — Chronic Care Management (AMB) (Signed)
Chronic Care Management Pharmacy Assistant   Name: Calvin [name]  [name]: [id number] DOB: 11-Jan-1946  Reason for Encounter: Disease State/ Hypertension Assessment   Conditions to be addressed/monitored: HTN  Recent office visits:  07/19/21 Laurey Morale, MD - Patient presented for Syncope and other concerns. No medication changes.  04/25/21 Laurey Morale, MD - Patient presented for Essential hypertension and other concerns. Changed Atorvastatin & Tamsulosin. Stopped Sildenafil.   Recent consult visits:  05/24/21 Karin Golden (Dermatology) - Patient presented for Seborrheic keratosis and other concerns. No other visit details available.  05/24/21 Rolly Pancake (Dermatology) - Patient presented for squamous cell carcinoma of skin. No other visit details available.  05/15/21 [facility]  04/20/21 Laurence Aly [facility]) - Patient presented for Eye exam and other concerns. No medication changes.  Hospital visits:  None in previous 6 months  Medications: Outpatient Encounter Medications as of 08/02/2021  Medication Sig   amLODipine (NORVASC) 5 MG tablet Take 1 tablet (5 mg total) by mouth daily.   atorvastatin (LIPITOR) 20 MG tablet Take 1 tablet (20 mg total) by mouth daily.   cholecalciferol (VITAMIN D3) 25 MCG (1000 UNIT) tablet Take 1,000 Units by mouth daily.   diazepam (VALIUM) 5 MG tablet Take 1 or 2 tablets about 30 minutes before procedures such as MRI scans   finasteride (PROSCAR) 5 MG tablet Take 1 tablet (5 mg total) by mouth daily.   hydrochlorothiazide (HYDRODIURIL) 25 MG tablet TAKE 1 TABLET BY MOUTH EVERY DAY   losartan (COZAAR) 100 MG tablet Take 1 tablet (100 mg total) by mouth daily.   potassium chloride (KLOR-CON 10) 10 MEQ tablet Take 1 tablet (10 mEq total) by mouth daily.   tamsulosin (FLOMAX) 0.4 MG CAPS capsule Take 1 capsule (0.4 mg total) by mouth daily.   vitamin C (ASCORBIC ACID) 500 MG tablet Take 500 mg by mouth  daily.   zinc gluconate 50 MG tablet Take 50 mg by mouth daily.   No facility-administered encounter medications on file as of 08/02/2021.  Reviewed chart prior to disease state call. Spoke with patient regarding BP  Recent Office Vitals: BP Readings from Last 3 Encounters:  07/19/21 126/80  04/25/21 120/78  03/08/21 138/68   Pulse Readings from Last 3 Encounters:  07/19/21 92  04/25/21 94  03/08/21 100    Wt Readings from Last 3 Encounters:  07/19/21 244 lb (110.7 kg)  04/25/21 244 lb (110.7 kg)  03/08/21 244 lb (110.7 kg)     Kidney Function Lab Results  Component Value Date/Time   CREATININE 0.76 07/19/2021 11:16 AM   CREATININE 0.75 04/25/2021 11:28 AM   CREATININE 0.84 01/05/2020 10:47 AM   GFR 88.84 07/19/2021 11:16 AM   GFRNONAA >60 01/21/2016 04:38 AM   GFRAA >60 01/21/2016 04:38 AM    BMP Latest Ref Rng & Units 07/19/2021 04/25/2021 01/05/2020  Glucose 70 - 99 mg/dL 99 105(H) 99  BUN 6 - 23 mg/dL 11 15 14   Creatinine 0.40 - 1.50 mg/dL 0.76 0.75 0.84  BUN/Creat Ratio 6 - 22 (calc) - - NOT APPLICABLE  Sodium 373 - 145 mEq/L 138 135 140  Potassium 3.5 - 5.1 mEq/L 3.3(L) 3.5 3.7  Chloride 96 - 112 mEq/L 102 97 104  CO2 19 - 32 mEq/L 27 26 23   Calcium 8.4 - 10.5 mg/dL 9.5 9.6 9.5   08/02/21 Current antihypertensive regimen:  Amlodipine 5 mg daily midnight HCTZ 25 mg daily (midnight)  Losartan 100 mg  daily midnight How often are you checking your Blood Pressure? daily Current home BP readings: patient reports he has been taking all his medications at midnight and has changed the batteries on his machine reports his blood pressure is lower in the mornings when he wakes up but by the end of the day is around 145 /90 he denies any hypotensive symptoms. What recent interventions/DTPs have been made by any provider to improve Blood Pressure control since last CPP Visit: Patient reports none Any recent hospitalizations or ED visits since last visit with CPP? No Advised  him I would check back with him in a few weeks so he has time to write down his readings, he was in agreement. Adherence Review: Is the patient currently on ACE/ARB medication? Yes Does the patient have >5 day gap between last estimated fill dates?No  08/14/21 Current antihypertensive regimen:  Amlodipine 5 mg daily midnight HCTZ 25 mg daily (midnight)  Losartan 100 mg daily midnight How often are you checking your Blood Pressure? daily Current home BP readings:This morning 131/81 What recent interventions/DTPs have been made by any provider to improve Blood Pressure control since last CPP Visit: Patient reports none Any recent hospitalizations or ED visits since last visit with CPP? No Notes: Patient denies any hyper/hypotensive symptoms he reports on his walk last week Thursday he extended his distance about 400 yards he reports on that day total he has about 4000 steps for the day. He reports he had to stop several times and sit on the ground as he did not have the energy and felt like his legs were giving or were going to give out on him and his energy felt depleted. He reports several people stopped him and asked if they could help him to his vehicle so he thinks he was looking pretty bad. He reports he finally got himself to his vehicle and home and has not been back out walking since. He reports he does not take a cane or stick with him as he does not have an issue with balance. He reports he will try and walk today but without adding any more distance than he is used to and see how it goes. Advised him I would make MP/aware and he could try and cut walk in half or less and work his way back up.  Adherence Review: Is the patient currently on ACE/ARB medication? Yes Does the patient have >5 day gap between last estimated fill dates?No  Care Gaps: Hepatitis C Screening - Overdue COVID Booster - overdue BP- 131/81 AWV- 9/22 CCM- 10/23  Star Rating Drugs: Atorvastatin (Lipitor) 20 mg -  [facility] Losartan (Cozaar) 100 mg - Last filled 05/06/21 30 DS at [place] Pharmacist Assistant [phone]

## 2021-08-04 ENCOUNTER — Telehealth: Payer: Self-pay

## 2021-08-04 NOTE — Telephone Encounter (Signed)
Pt PA for Diazepam was approved from 01/0/2023 to 09/01/2021

## 2021-08-20 DIAGNOSIS — Z823 Family history of stroke: Secondary | ICD-10-CM | POA: Diagnosis not present

## 2021-08-20 DIAGNOSIS — N529 Male erectile dysfunction, unspecified: Secondary | ICD-10-CM | POA: Diagnosis not present

## 2021-08-20 DIAGNOSIS — R32 Unspecified urinary incontinence: Secondary | ICD-10-CM | POA: Diagnosis not present

## 2021-08-20 DIAGNOSIS — I951 Orthostatic hypotension: Secondary | ICD-10-CM | POA: Diagnosis not present

## 2021-08-20 DIAGNOSIS — I10 Essential (primary) hypertension: Secondary | ICD-10-CM | POA: Diagnosis not present

## 2021-08-20 DIAGNOSIS — N400 Enlarged prostate without lower urinary tract symptoms: Secondary | ICD-10-CM | POA: Diagnosis not present

## 2021-08-20 DIAGNOSIS — E785 Hyperlipidemia, unspecified: Secondary | ICD-10-CM | POA: Diagnosis not present

## 2021-08-20 DIAGNOSIS — M1990 Unspecified osteoarthritis, unspecified site: Secondary | ICD-10-CM | POA: Diagnosis not present

## 2021-08-20 DIAGNOSIS — K219 Gastro-esophageal reflux disease without esophagitis: Secondary | ICD-10-CM | POA: Diagnosis not present

## 2021-08-20 DIAGNOSIS — G8929 Other chronic pain: Secondary | ICD-10-CM | POA: Diagnosis not present

## 2021-08-20 DIAGNOSIS — Z008 Encounter for other general examination: Secondary | ICD-10-CM | POA: Diagnosis not present

## 2021-08-20 DIAGNOSIS — E669 Obesity, unspecified: Secondary | ICD-10-CM | POA: Diagnosis not present

## 2021-08-20 DIAGNOSIS — Z6831 Body mass index (BMI) 31.0-31.9, adult: Secondary | ICD-10-CM | POA: Diagnosis not present

## 2021-11-28 ENCOUNTER — Telehealth: Payer: Self-pay | Admitting: Pharmacist

## 2021-11-28 NOTE — Chronic Care Management (AMB) (Signed)
Chronic Care Management Pharmacy Assistant   Name: Calvin [name]  [name]: [id number] DOB: 1945/10/30  Reason for Encounter: Disease State   Conditions to be addressed/monitored: HTN  Recent office visits:  None  Recent consult visits:  None   Hospital visits:  None in previous 6 months  Medications: Outpatient Encounter Medications as of 11/28/2021  Medication Sig   amLODipine (NORVASC) 5 MG tablet Take 1 tablet (5 mg total) by mouth daily.   atorvastatin (LIPITOR) 20 MG tablet Take 1 tablet (20 mg total) by mouth daily.   cholecalciferol (VITAMIN D3) 25 MCG (1000 UNIT) tablet Take 1,000 Units by mouth daily.   diazepam (VALIUM) 5 MG tablet Take 1 or 2 tablets about 30 minutes before procedures such as MRI scans   finasteride (PROSCAR) 5 MG tablet Take 1 tablet (5 mg total) by mouth daily.   hydrochlorothiazide (HYDRODIURIL) 25 MG tablet TAKE 1 TABLET BY MOUTH EVERY DAY   losartan (COZAAR) 100 MG tablet Take 1 tablet (100 mg total) by mouth daily.   potassium chloride (KLOR-CON 10) 10 MEQ tablet Take 1 tablet (10 mEq total) by mouth daily.   tamsulosin (FLOMAX) 0.4 MG CAPS capsule Take 1 capsule (0.4 mg total) by mouth daily.   vitamin C (ASCORBIC ACID) 500 MG tablet Take 500 mg by mouth daily.   zinc gluconate 50 MG tablet Take 50 mg by mouth daily.   No facility-administered encounter medications on file as of 11/28/2021.   Reviewed chart prior to disease state call. Spoke with patient regarding BP  Recent Office Vitals: BP Readings from Last 3 Encounters:  07/19/21 126/80  04/25/21 120/78  03/08/21 138/68   Pulse Readings from Last 3 Encounters:  07/19/21 92  04/25/21 94  03/08/21 100    Wt Readings from Last 3 Encounters:  07/19/21 244 lb (110.7 kg)  04/25/21 244 lb (110.7 kg)  03/08/21 244 lb (110.7 kg)     Kidney Function Lab Results  Component Value Date/Time   CREATININE 0.76 07/19/2021 11:16 AM   CREATININE 0.75 04/25/2021 11:28 AM   CREATININE  0.84 01/05/2020 10:47 AM   GFR 88.84 07/19/2021 11:16 AM   GFRNONAA >60 01/21/2016 04:38 AM   GFRAA >60 01/21/2016 04:38 AM       Latest Ref Rng & Units 07/19/2021   11:16 AM 04/25/2021   11:28 AM 01/05/2020   10:47 AM  BMP  Glucose 70 - 99 mg/dL 99  105  99   BUN 6 - 23 mg/dL '11  15  14   '$ Creatinine 0.40 - 1.50 mg/dL 0.76  0.75  0.84   BUN/Creat Ratio 6 - 22 (calc)   NOT APPLICABLE   Sodium 993 - 145 mEq/L 138  135  140   Potassium 3.5 - 5.1 mEq/L 3.3  3.5  3.7   Chloride 96 - 112 mEq/L 102  97  104   CO2 19 - 32 mEq/L '27  26  23   '$ Calcium 8.4 - 10.5 mg/dL 9.5  9.6  9.5     Current antihypertensive regimen:  Amlodipine 5 mg daily  HCTZ 25 mg daily (midnight)  How often are you checking your Blood Pressure? 3-5x per week Current home BP readings: Patient reports his pressures this past week has been a bit higher for him around 150/90 he is not experiencing any headaches/ dizziness, no record or recent stressors and he expresses compliance in the above medication use. He reports his cuff is a few years old.  Advised he should try changing the battery and stop by a drugstore to check his pressure just to be sure his machine is arcuately recording his pressures. He reports he shall give it a few days and do that when he has some time is planning to go out of town on tomorrow. He reports he will call to the office for an appointment if his pressures are at that time still around the range he had been getting at home in the event a medication change is needed. Plan to touch base with him again in a few weeks. What recent interventions/DTPs have been made by any provider to improve Blood Pressure control since last CPP Visit: Patient reports none Any recent hospitalizations or ED visits since last visit with CPP? No  Adherence Review: Is the patient currently on ACE/ARB medication? No Does the patient have >5 day gap between last estimated fill dates? No     Care Gaps: Hepatitis C  Screening - Overdue COVID Booster - Overdue BP- 131/81 AWV- 9/22 CCM- 10/23  Star Rating Drugs: Atorvastatin (Lipitor) 20 mg - [facility] Losartan (Cozaar) 100 mg - Last filled 11/08/21 90 DS at [place] Pharmacist Assistant [phone]

## 2021-12-25 ENCOUNTER — Encounter: Payer: Self-pay | Admitting: Family Medicine

## 2021-12-25 ENCOUNTER — Ambulatory Visit (INDEPENDENT_AMBULATORY_CARE_PROVIDER_SITE_OTHER): Payer: Medicare HMO | Admitting: Family Medicine

## 2021-12-25 VITALS — BP 110/78 | HR 97 | Temp 99.0°F | Wt 247.0 lb

## 2021-12-25 DIAGNOSIS — I10 Essential (primary) hypertension: Secondary | ICD-10-CM | POA: Diagnosis not present

## 2021-12-25 DIAGNOSIS — F1010 Alcohol abuse, uncomplicated: Secondary | ICD-10-CM

## 2021-12-25 DIAGNOSIS — E538 Deficiency of other specified B group vitamins: Secondary | ICD-10-CM | POA: Diagnosis not present

## 2021-12-25 DIAGNOSIS — F418 Other specified anxiety disorders: Secondary | ICD-10-CM | POA: Diagnosis not present

## 2021-12-25 DIAGNOSIS — R29898 Other symptoms and signs involving the musculoskeletal system: Secondary | ICD-10-CM

## 2021-12-25 DIAGNOSIS — G20 Parkinson's disease: Secondary | ICD-10-CM

## 2021-12-25 DIAGNOSIS — F32A Depression, unspecified: Secondary | ICD-10-CM | POA: Insufficient documentation

## 2021-12-25 DIAGNOSIS — R69 Illness, unspecified: Secondary | ICD-10-CM | POA: Diagnosis not present

## 2021-12-25 LAB — CBC WITH DIFFERENTIAL/PLATELET
Basophils Absolute: 0 10*3/uL (ref 0.0–0.1)
Basophils Relative: 0.6 % (ref 0.0–3.0)
Eosinophils Absolute: 0.2 10*3/uL (ref 0.0–0.7)
Eosinophils Relative: 2 % (ref 0.0–5.0)
HCT: 43.3 % (ref 39.0–52.0)
Hemoglobin: 15.1 g/dL (ref 13.0–17.0)
Lymphocytes Relative: 26.7 % (ref 12.0–46.0)
Lymphs Abs: 2.2 10*3/uL (ref 0.7–4.0)
MCHC: 34.7 g/dL (ref 30.0–36.0)
MCV: 102.9 fl — ABNORMAL HIGH (ref 78.0–100.0)
Monocytes Absolute: 0.8 10*3/uL (ref 0.1–1.0)
Monocytes Relative: 9.9 % (ref 3.0–12.0)
Neutro Abs: 5 10*3/uL (ref 1.4–7.7)
Neutrophils Relative %: 60.8 % (ref 43.0–77.0)
Platelets: 258 10*3/uL (ref 150.0–400.0)
RBC: 4.21 Mil/uL — ABNORMAL LOW (ref 4.22–5.81)
RDW: 13.1 % (ref 11.5–15.5)
WBC: 8.2 10*3/uL (ref 4.0–10.5)

## 2021-12-25 LAB — FOLATE: Folate: >24.2 ng/mL (ref >5.9)

## 2021-12-25 LAB — BASIC METABOLIC PANEL
BUN: 18 mg/dL (ref 6–23)
CO2: 26 mEq/L (ref 19–32)
Calcium: 9.9 mg/dL (ref 8.4–10.5)
Chloride: 99 mEq/L (ref 96–112)
Creatinine, Ser: 0.97 mg/dL (ref 0.40–1.50)
GFR: 76.93 mL/min (ref >60.00)
Glucose, Bld: 109 mg/dL — ABNORMAL HIGH (ref 70–99)
Potassium: 3.4 mEq/L — ABNORMAL LOW (ref 3.5–5.1)
Sodium: 139 mEq/L (ref 135–145)

## 2021-12-25 LAB — HEPATIC FUNCTION PANEL
ALT: 123 U/L — ABNORMAL HIGH (ref 0–53)
AST: 84 U/L — ABNORMAL HIGH (ref 0–37)
Albumin: 4.5 g/dL (ref 3.5–5.2)
Alkaline Phosphatase: 73 U/L (ref 39–117)
Bilirubin, Direct: 0.3 mg/dL (ref 0.0–0.3)
Total Bilirubin: 1.2 mg/dL (ref 0.2–1.2)
Total Protein: 7.4 g/dL (ref 6.0–8.3)

## 2021-12-25 LAB — TSH: TSH: 3.94 u[IU]/mL (ref 0.35–5.50)

## 2021-12-25 LAB — SEDIMENTATION RATE: Sed Rate: 21 mm/hr — ABNORMAL HIGH (ref 0–20)

## 2021-12-25 LAB — VITAMIN B12: Vitamin B-12: 146 pg/mL — ABNORMAL LOW (ref 211–911)

## 2021-12-25 LAB — AMMONIA: Ammonia: 28 umol/L (ref 11–35)

## 2021-12-25 LAB — CK: Total CK: 42 U/L (ref 7–232)

## 2021-12-25 LAB — HEMOGLOBIN A1C: Hgb A1c MFr Bld: 5.7 % (ref 4.6–6.5)

## 2021-12-25 MED ORDER — SERTRALINE HCL 50 MG PO TABS: 50.0000 mg | ORAL_TABLET | Freq: Every day | ORAL | 3 refills | Status: DC

## 2021-12-25 NOTE — Progress Notes (Signed)
   Subjective:    Patient ID: Calvin [name], male    DOB: 15-Oct-1945, 76 y.o.   [name]: [id number]  HPI Here with his daughter visiting from [place] for several issues. First over the past year he has had slowing worsening weakness in both legs and a very slow gait. He is unsteady on his feet, and he has fallen 3 times in the past 6 months. He feels very fatigued, and he can only walk short distances without stopping to rest. He has begin to walk with a cane. No chest pain or SOB. His BP has been stable. He also admits to feeling very depressed for a few months. He feels sad and hopeless at times, and he tends to sleep more than he used to. He denies any suicidal thoughts. He drink a fifth of liquor a week, and he says it helps him feel better. His daughter basically insisted he come in to see [place] today.    Review of Systems  Constitutional:  Positive for fatigue.  Respiratory: Negative.    Cardiovascular:  Positive for leg swelling. Negative for chest pain and palpitations.  Gastrointestinal: Negative.   Genitourinary: Negative.   Neurological:  Positive for weakness and light-headedness. Negative for dizziness, tremors, seizures, syncope, facial asymmetry, speech difficulty, numbness and headaches.  Psychiatric/Behavioral:  Positive for decreased concentration and dysphoric mood. Negative for agitation, behavioral problems, confusion, hallucinations, self-injury, sleep disturbance and suicidal ideas. The patient is nervous/anxious.        Objective:   Physical Exam Constitutional:      Appearance: He is obese.     Comments: He walks very slowly with small, shuffling steps. Using a cane. He tends to lean forward as he walks   Cardiovascular:     Rate and Rhythm: Normal rate and regular rhythm.     Pulses: Normal pulses.     Heart sounds: Normal heart sounds.  Pulmonary:     Effort: Pulmonary effort is normal.     Breath sounds: Normal breath sounds.  Neurological:     Mental Status: He  is alert and oriented to person, place, and time.     Comments: No visible tremors   Psychiatric:        Behavior: Behavior normal.        Thought Content: Thought content normal.     Comments: Mildly depressed affect, though his eye contact is good and he jokes at times            Assessment & Plan:  He has had worsening leg weakness and difficulty walking, and I suspect he may have Parkinson's disease. We will get  labs today to rule out other problems, and we will refer him to Neurology to evaluate. He is also depressed,  and he will start on Zoloft 50 mg daily. I urged him to stop drinking alcohol completely. We will see him back in 3 weeks to follow up on these issues.  Alysia Penna, MD

## 2021-12-26 ENCOUNTER — Encounter: Payer: Self-pay | Admitting: Neurology

## 2021-12-26 MED ORDER — CYANOCOBALAMIN 1000 MCG/ML IJ SOLN: 1000.0000 ug | INTRAMUSCULAR | 0 refills | Status: DC

## 2021-12-26 MED ORDER — POTASSIUM CHLORIDE CRYS ER 10 MEQ PO TBCR: 10.0000 meq | EXTENDED_RELEASE_TABLET | Freq: Two times a day (BID) | ORAL | 3 refills | Status: DC

## 2021-12-26 MED ORDER — "BD ECLIPSE SYRINGE/NEEDLE 25G X 5/8"" 3 ML MISC": 0 refills | Status: DC

## 2021-12-26 NOTE — Addendum Note (Signed)
Addended by: Agnes Lawrence on: 12/26/2021 10:41 AM   Modules accepted: Orders

## 2022-01-09 NOTE — Progress Notes (Unsigned)
Assessment/Plan:    ***2.  B12 deficiency  -pt just started on injections    Subjective:   Calvin [name] was seen today in the movement disorders clinic for neurologic consultation at the request of Laurey Morale, MD.  The consultation is for the evaluation of gait instability and to rule out Parkinson's disease.  Primary care records made available to me are reviewed.  Patient was seen by primary care July 10 and the diagnosis of Parkinson's disease was suspected and he was sent here for further evaluation.  At that visit, depression was also identified and patient was started on sertraline.  In addition, significant alcohol use was identified (fifth of liquor a week) was used as a coping mechanism and his primary care physician was recommending that he stop that.   Specific Symptoms:  Tremor: {yes no:314532} Family hx of similar:  {yes no:314532} Voice: *** Sleep: ***  Vivid Dreams:  {yes no:314532}  Acting out dreams:  {yes no:314532} Wet Pillows: {yes no:314532} Postural symptoms:  {yes no:314532}  Falls?  {yes no:314532} Bradykinesia symptoms: {parkinson brady:18041} Loss of smell:  {yes no:314532} Loss of taste:  {yes no:314532} Urinary Incontinence:  {yes no:314532} Difficulty Swallowing:  {yes no:314532} Handwriting, micrographia: {yes no:314532} Trouble with ADL's:  {yes no:314532}  Trouble buttoning clothing: {yes no:314532} Depression:  {yes no:314532} Memory changes:  {yes no:314532} Hallucinations:  {yes no:314532}  visual distortions: {yes no:314532} N/V:  {yes no:314532} Lightheaded:  {yes no:314532}  Syncope: {yes no:314532} Diplopia:  {yes no:314532} Dyskinesia:  {yes no:314532} Prior exposure to reglan/antipsychotics: {yes no:314532}  Neuroimaging of the brain has *** previously been performed.  It *** available for my review today.  PREVIOUS MEDICATIONS: {Parkinson's RX:18200}  ALLERGIES:  No Known Allergies  CURRENT MEDICATIONS:  No  outpatient medications have been marked as taking for the 01/11/22 encounter (Appointment) with Keats Kingry, Eustace Quail, DO.     Objective:   VITALS:  There were no vitals filed for this visit.  GEN:  The patient appears stated age and is in NAD. HEENT:  Normocephalic, atraumatic.  The mucous membranes are moist. The superficial temporal arteries are without ropiness or tenderness. CV:  RRR Lungs:  CTAB Neck/HEME:  There are no carotid bruits bilaterally.  Neurological examination:  Orientation: The patient is alert and oriented x3.  Cranial nerves: There is good facial symmetry. Extraocular muscles are intact. The visual fields are full to confrontational testing. The speech is fluent and clear. Soft palate rises symmetrically and there is no tongue deviation. Hearing is intact to conversational tone. Sensation: Sensation is intact to light and pinprick throughout (facial, trunk, extremities). Vibration is intact at the bilateral big toe. There is no extinction with double simultaneous stimulation. There is no sensory dermatomal level identified. Motor: Strength is 5/5 in the bilateral upper and lower extremities.   Shoulder shrug is equal and symmetric.  There is no pronator drift. Deep tendon reflexes: Deep tendon reflexes are 2/4 at the bilateral biceps, triceps, brachioradialis, patella and achilles. Plantar responses are downgoing bilaterally.  Movement examination: Tone: There is ***tone in the bilateral upper extremities.  The tone in the lower extremities is ***.  Abnormal movements: *** Coordination:  There is *** decremation with RAM's, *** Gait and Station: The patient has *** difficulty arising out of a deep-seated chair without the use of the hands. The patient's stride length is ***.  The patient has a *** pull test.     I have reviewed and interpreted the following  labs independently   Chemistry      Component Value Date/Time   NA 139 12/25/2021 1441   K 3.4 (L) 12/25/2021 1441    CL 99 12/25/2021 1441   CO2 26 12/25/2021 1441   BUN 18 12/25/2021 1441   CREATININE 0.97 12/25/2021 1441   CREATININE 0.84 01/05/2020 1047      Component Value Date/Time   CALCIUM 9.9 12/25/2021 1441   ALKPHOS 73 12/25/2021 1441   AST 84 (H) 12/25/2021 1441   ALT 123 (H) 12/25/2021 1441   BILITOT 1.2 12/25/2021 1441      Lab Results  Component Value Date   TSH 3.94 12/25/2021   Lab Results  Component Value Date   WBC 8.2 12/25/2021   HGB 15.1 12/25/2021   HCT 43.3 12/25/2021   MCV 102.9 (H) 12/25/2021   PLT 258.0 12/25/2021   Lab Results  Component Value Date   VITAMINB12 146 (L) 12/25/2021      Total time spent on today's visit was ***60 minutes, including both face-to-face time and nonface-to-face time.  Time included that spent on review of records (prior notes available to me/labs/imaging if pertinent), discussing treatment and goals, answering patient's questions and coordinating care.  Cc:  Laurey Morale, MD

## 2022-01-10 ENCOUNTER — Ambulatory Visit (INDEPENDENT_AMBULATORY_CARE_PROVIDER_SITE_OTHER): Payer: Medicare HMO | Admitting: Family Medicine

## 2022-01-10 ENCOUNTER — Encounter: Payer: Self-pay | Admitting: Family Medicine

## 2022-01-10 VITALS — BP 118/78 | HR 68 | Temp 98.8°F | Wt 239.0 lb

## 2022-01-10 DIAGNOSIS — F418 Other specified anxiety disorders: Secondary | ICD-10-CM | POA: Diagnosis not present

## 2022-01-10 DIAGNOSIS — R69 Illness, unspecified: Secondary | ICD-10-CM | POA: Diagnosis not present

## 2022-01-10 DIAGNOSIS — R29898 Other symptoms and signs involving the musculoskeletal system: Secondary | ICD-10-CM | POA: Diagnosis not present

## 2022-01-10 DIAGNOSIS — F1010 Alcohol abuse, uncomplicated: Secondary | ICD-10-CM | POA: Diagnosis not present

## 2022-01-10 MED ORDER — SERTRALINE HCL 100 MG PO TABS: 100.0000 mg | ORAL_TABLET | Freq: Every day | ORAL | 3 refills | Status: DC

## 2022-01-10 NOTE — Progress Notes (Signed)
   Subjective:    Patient ID: Calvin [name], male    DOB: [birth year], 76 y.o.   [name]: [id number]  HPI Here to follow up on bilateral leg weakness, difficulty walking, and depression. 3 weeks ago we saw him and he was walking very slowly with a shuffling gait, and he required the use of a cane. Lab work revealed a low B12 at 146, so he was started on weekly b12 shots. We started him on Zoloft 50 mg daily, and we encouraged hi, to stop drinking alcohol. We referred him to Neurology, and he is scheduled to see Dr. Carles Collet tomorrow. Since our last visit, he says he feels a little better as far as leg strength, but he still gets very tired after walking short distances. His moods are about the same. He is proud to say he has completely stopped using alcohol.   Review of Systems  Constitutional: Negative.   Respiratory: Negative.    Cardiovascular: Negative.   Neurological:  Positive for weakness.  Psychiatric/Behavioral:  Positive for dysphoric mood and sleep disturbance. Negative for agitation, confusion, hallucinations, self-injury and suicidal ideas. The patient is not nervous/anxious.        Objective:   Physical Exam Constitutional:      Comments: He looks much improved since our last visit. He still walks slowly, but he no longer shuffles his feet. He is not using a cane .  Cardiovascular:     Rate and Rhythm: Normal rate and regular rhythm.     Pulses: Normal pulses.     Heart sounds: Normal heart sounds.  Pulmonary:     Effort: Pulmonary effort is normal.     Breath sounds: Normal breath sounds.  Neurological:     Mental Status: He is alert.  Psychiatric:        Mood and Affect: Mood normal.        Behavior: Behavior normal.        Thought Content: Thought content normal.           Assessment & Plan:  He is seen for leg weakness and depression. We are treating a low B12, and he will continue to give himself weekly shots. After doing this for 12 weeks, we will check another B12  level. This has definitely improved his leg strength and his walking ability, but he will see Neurology to rule out Parkinsonian conditions. For the depression we will increase the Zoloft to 100 mg daily. I congratulated him on stopping the alcohol use. We will see him back in 9 weeks.  Alysia Penna, MD

## 2022-01-11 ENCOUNTER — Ambulatory Visit: Payer: Medicare HMO | Admitting: Neurology

## 2022-01-11 ENCOUNTER — Encounter: Payer: Self-pay | Admitting: Neurology

## 2022-01-11 ENCOUNTER — Ambulatory Visit: Payer: Medicare HMO | Admitting: Family Medicine

## 2022-01-11 VITALS — BP 147/83 | HR 89 | Ht 72.0 in | Wt 242.0 lb

## 2022-01-11 DIAGNOSIS — G609 Hereditary and idiopathic neuropathy, unspecified: Secondary | ICD-10-CM

## 2022-01-11 DIAGNOSIS — R258 Other abnormal involuntary movements: Secondary | ICD-10-CM

## 2022-01-11 NOTE — Patient Instructions (Signed)
The physicians and staff at [facility] are committed to providing excellent care. You may receive a survey requesting feedback about your experience at our office. We strive to receive "very good" responses to the survey questions. If you feel that your experience would prevent you from giving the office a "very good " response, please contact our office to try to remedy the situation. We may be reached at [phone]. Thank you for taking the time out of your busy day to complete the survey.  

## 2022-01-16 ENCOUNTER — Ambulatory Visit: Payer: Medicare HMO | Attending: Neurology | Admitting: Rehabilitation

## 2022-01-16 DIAGNOSIS — M6281 Muscle weakness (generalized): Secondary | ICD-10-CM | POA: Insufficient documentation

## 2022-01-16 DIAGNOSIS — R2681 Unsteadiness on feet: Secondary | ICD-10-CM | POA: Insufficient documentation

## 2022-01-16 DIAGNOSIS — R600 Localized edema: Secondary | ICD-10-CM | POA: Insufficient documentation

## 2022-01-16 DIAGNOSIS — R2689 Other abnormalities of gait and mobility: Secondary | ICD-10-CM | POA: Insufficient documentation

## 2022-01-17 ENCOUNTER — Ambulatory Visit: Payer: Medicare HMO | Admitting: Physical Therapy

## 2022-01-17 ENCOUNTER — Encounter: Payer: Self-pay | Admitting: Physical Therapy

## 2022-01-17 VITALS — BP 150/95 | HR 97

## 2022-01-17 DIAGNOSIS — R2681 Unsteadiness on feet: Secondary | ICD-10-CM

## 2022-01-17 DIAGNOSIS — M6281 Muscle weakness (generalized): Secondary | ICD-10-CM

## 2022-01-17 DIAGNOSIS — R600 Localized edema: Secondary | ICD-10-CM

## 2022-01-17 DIAGNOSIS — R2689 Other abnormalities of gait and mobility: Secondary | ICD-10-CM

## 2022-01-17 NOTE — Therapy (Signed)
OUTPATIENT PHYSICAL THERAPY NEURO EVALUATION   Patient Name: Calvin [name] [name]: [id number] DOB:12-Apr-1946, 76 y.o., male Today's Date: 01/17/2022   PCP: Laurey Morale., MD REFERRING PROVIDER: Ludwig Clarks, DO    PT End of Session - 01/17/22 1330     Visit Number 1    Number of Visits 9   8+eval   Date for PT Re-Evaluation 03/30/22   Pushed out due to delay in scheduling.   Authorization Type [employer] MEDICARE    PT Start Time 1316    PT Stop Time 1409    PT Time Calculation (min) 53 min    Equipment Utilized During Treatment Gait belt    Activity Tolerance Patient tolerated treatment well    Behavior During Therapy WFL for tasks assessed/performed             Past Medical History:  Diagnosis Date   Benign prostatic hypertrophy    ED (erectile dysfunction)    Headache(784.0)    Hypertension    Hypogonadism male    Nephrolithiasis    hx of had lithotripsy in 1-10.   Past Surgical History:  Procedure Laterality Date   colonoscopy  10/05/2020   per Dr. Fuller Plan, adenomatous polyps, repeat in 3 yrs   [place]  03/12/2012   Procedure: APPENDECTOMY LAPAROSCOPIC;  Surgeon: Harl Bowie, MD;  Location: [facility];  Service: General;  Laterality: N/A;   STAPLE HEMORRHOIDECTOMY  04/14/2010   per Dr. Johney Maine    TONSILLECTOMY     Patient Active Problem List   Diagnosis Date Noted   Bilateral leg weakness 01/10/2022   Depression with anxiety 12/25/2021   Alcohol abuse 12/25/2021   Low back pain 06/09/2020   COVID-19 virus infection 02/24/2020   Primary osteoarthritis of left knee 04/23/2018   Hypogonadism in male 01/19/2016   Foot swelling 01/19/2016   Insomnia 10/30/2013   INTERNAL HEMORRHOIDS WITH OTHER COMPLICATION 94/80/1655   BPH (benign prostatic hyperplasia) 10/25/2008   NEPHROLITHIASIS, HX OF 10/25/2008   Headache 09/15/2007   Essential hypertension 07/07/2007    ONSET DATE: 01/11/2022 (referral)  REFERRING DIAG: G60.9  (ICD-10-CM) - Idiopathic peripheral neuropathy   THERAPY DIAG:  Other abnormalities of gait and mobility  Unsteadiness on feet  Muscle weakness (generalized)  Localized edema  Rationale for Evaluation and Treatment Rehabilitation  SUBJECTIVE:                                                                                                                                                                                              SUBJECTIVE STATEMENT: "The walking is bothering  me most.  I was going to the gym and doing everything with the leg machines, but have not been in a couple of months now.  The balance and the walking are the issues.  The upper body is as good as ever." Pt accompanied by: self  PERTINENT HISTORY: ETOH abuse, HTN, insomnia, depression, anxiety, L knee OA, R foot excision of growth approx. 1 year ago  PAIN:  Are you having pain? No  PRECAUTIONS: Fall  WEIGHT BEARING RESTRICTIONS No  FALLS: Has patient fallen in last 6 months? Yes. Number of falls 1 fall approximately 2 months ago when he was walking in a parking lot, his legs gave out (denies dizziness or headache prior), and he hit his head on the curb resulting in splitting his right forehead open and LOC.  He was evaluated by EMS, but refused further hospital evaluation.  He has had prior falls, one a few months ago where he exited his car and woke up laying next to it without recollection of what happened.  His first fall was 3 years ago when he was sleep walking due to Ambien usage and fell, he recalls waking in a puddle of blood from hitting his right forehead (he assumes on his bedpost as that was in proximity to where he was laying), he had x-rays following this fall (he drove to the hospital) and denies any bony damage.  LIVING ENVIRONMENT: Lives with: lives alone Lives in: House/apartment Stairs: Yes: Internal: split level-unsure of number of stairs steps; none - uses wall to access basement level of  home Has following equipment at home: Single point cane, Quad cane small base, and Grab bars  PLOF: Independent  PATIENT GOALS "To fix my walking."  OBJECTIVE:  VITALS: Today's Vitals   01/17/22 1322  BP: (!) 150/95  Pulse: 97   DIAGNOSTIC FINDINGS: MRI Brain 07/29/2021 generalized atrophy-per neurologist note it is moderate  COGNITION: Overall cognitive status: No family/caregiver present to determine baseline cognitive functioning and Pt states no acute change or abnormality   SENSATION: Light touch: WFL States right big toe "feels weird since surgery"  COORDINATION: Heel-to-shin:  WFL LE RAMPS:  WFL  EDEMA:  Intermittent R foot swelling due to past surgical procedure per pt.  MUSCLE TONE: none noted bilaterally  POSTURE: forward head  LOWER EXTREMITY ROM:     Active  Right Eval Left Eval  Hip flexion WNL WNL  Hip extension    Hip abduction " "  Hip adduction " "  Hip internal rotation    Hip external rotation    Knee flexion " "  Knee extension " "  Ankle dorsiflexion " "  Ankle plantarflexion    Ankle inversion    Ankle eversion     (Blank rows = not tested)  LOWER EXTREMITY MMT:    MMT Right Eval Left Eval  Hip flexion 4+/5 4+/5  Hip extension    Hip abduction 4+/5 4+/5  Hip adduction 5/5 5/5  Hip internal rotation    Hip external rotation    Knee flexion 5/5 5/5  Knee extension 4+/5 4+/5  Ankle dorsiflexion 5/5 5/5  Ankle plantarflexion    Ankle inversion    Ankle eversion    (Blank rows = not tested)  BED MOBILITY:  Sit to supine Complete Independence Supine to sit Complete Independence  TRANSFERS: Assistive device utilized: None  Sit to stand: Complete Independence Stand to sit: Complete Independence Chair to chair: Complete Independence Floor: Complete Independence  STAIRS:  Level of Assistance: SBA and CGA  Stair Negotiation Technique: Step to Pattern with Bilateral Rails  Number of Stairs: 4   Height of Stairs:  6"  Comments: Pt requires prompting to descend stairs.  Leads w/ RLE ascending and descending (reports intermittent catch in L knee-no reported injury prior and chronic issue).  GAIT: Gait pattern: step to pattern, decreased arm swing- Right, decreased arm swing- Left, decreased step length- Left, decreased stance time- Right, decreased stride length, shuffling, wide BOS, poor foot clearance- Right, and poor foot clearance- Left Distance walked: various clinic distances Assistive device utilized: None Level of assistance: SBA Comments: Pt demonstrates stiff posturing and mild posterior trunk lean during limb advancement.  FUNCTIONAL TESTs:  5 times sit to stand: 18.53 sec no UE support but uses back of legs to stabilize against mat Timed up and go (TUG): 13.69 sec, mild retropulsion, no AD 2 minute walk test: To be assessed. 10 meter walk test: 16.31 sec = 0.61 m/sec OR 2.02 ft/sec Berg Balance Scale: To be assessed.  PATIENT SURVEYS:  None completed.  TODAY'S TREATMENT:  N/A   PATIENT EDUCATION: Education details: PT POC, assessments used, and goals assessed. Person educated: Patient Education method: Explanation Education comprehension: verbalized understanding   HOME EXERCISE PROGRAM: N/A  GOALS: Goals reviewed with patient? Yes  SHORT TERM GOALS: Target date: 02/16/2022  Pt will be independent with balance HEP and walking program to promote gains outside of clinic. Baseline:  To be established. Goal status: INITIAL  2.  2MWT to be assessed with goal set as appropriate. Baseline: To be assessed. Goal status: INITIAL  3.  BERG to be assessed with goal set as appropriate. Baseline:  To be assessed. Goal status: INITIAL  4.  Pt will demonstrate TUG of </=12 seconds in order to decrease risk of falls and improve functional mobility using LRAD. Baseline: 13.69 sec no AD, mild retropulsion Goal status: INITIAL  5.  Pt will decrease 5xSTS to </=15 seconds w/ or w/o UE  support and improved anterior weight shift in order to demonstrate decreased risk for falls and improved functional bilateral LE strength and power. Baseline:  18.53 sec w/o UE support and use of legs against EOM due retropulsion, uncontrolled descent Goal status: INITIAL  LONG TERM GOALS: Target date: 03/16/2022  Pt will demonstrate independence with floor recovery with verbalization of fall prevention strategies in order to promote safety in home environment. Baseline:  To be reviewed. Goal status: INITIAL  2.  BERG to be assessed with goal set as appropriate. Baseline:  To be assessed. Goal status: INITIAL  3.  Pt will demonstrate a gait speed of >/=2.62 feet/sec w/ LRAD in order to decrease risk for falls. Baseline: 2.02 ft/sec w/o AD Goal status: INITIAL  4.  Pt will decrease 5xSTS to </=13 seconds with improved anterior weight shift in order to demonstrate decreased risk for falls and improved functional bilateral LE strength and power. Baseline: 18.53 sec no UE, retropulsive Goal status: INITIAL  5.  Pt will ambulate >/=500 feet on level surfaces with LRAD, demonstrating improved anterior weight shift at independent level of assist to promote household and community access. Baseline: various clinic distances, SBA, retropulsive Goal status: INITIAL  ASSESSMENT:  CLINICAL IMPRESSION: Patient is a 76 y.o. male who was seen today for physical therapy evaluation and treatment for imbalance and difficulty ambulating.  Pt has a significant PMH of ETOH abuse, HTN, insomnia, depression, anxiety, L knee OA, and R foot excision of growth approx. 1  year ago.  Identified impairments include retropulsion during standing transfers and gait, imbalance especially in dynamic context, need for stair safety training, and decreased endurance due to recent inactivity.  Evaluation via the following assessment tools: 5xSTS, TUG, and 10MWT indicate fall risk.  He would benefit from skilled PT to address  impairments as noted and progress towards long term goals.  OBJECTIVE IMPAIRMENTS Abnormal gait, decreased activity tolerance, decreased balance, decreased endurance, decreased knowledge of use of DME, difficulty walking, decreased safety awareness, increased edema, and impaired sensation.   ACTIVITY LIMITATIONS standing, squatting, stairs, and locomotion level  PARTICIPATION LIMITATIONS: community activity  PERSONAL FACTORS Age, Past/current experiences, Social background, Time since onset of injury/illness/exacerbation, and 3+ comorbidities: ETOH abuse, HTN, depression  are also affecting patient's functional outcome.   REHAB POTENTIAL: Good  CLINICAL DECISION MAKING: Evolving/moderate complexity  EVALUATION COMPLEXITY: Moderate  PLAN: PT FREQUENCY: 1x/week  PT DURATION: 8 weeks  PLANNED INTERVENTIONS: Therapeutic exercises, Therapeutic activity, Neuromuscular re-education, Balance training, Gait training, Patient/Family education, Self Care, Stair training, Vestibular training, DME instructions, and Re-evaluation  PLAN FOR NEXT SESSION: Assess 2MWT, BERG-set STG/LTG as appropriate, initiate balance HEP and walking program, gait training w/ SPC vs w/o (try treadmill as well), stair training using rail, anti-retropulsion activities, SciFit as needed for endurance.   Bary Richard, PT, DPT 01/17/2022, 2:15 PM

## 2022-01-22 ENCOUNTER — Encounter: Payer: Self-pay | Admitting: Physical Therapy

## 2022-01-22 ENCOUNTER — Ambulatory Visit: Payer: Medicare HMO | Admitting: Physical Therapy

## 2022-01-22 DIAGNOSIS — R600 Localized edema: Secondary | ICD-10-CM | POA: Diagnosis not present

## 2022-01-22 DIAGNOSIS — M6281 Muscle weakness (generalized): Secondary | ICD-10-CM | POA: Diagnosis not present

## 2022-01-22 DIAGNOSIS — R2681 Unsteadiness on feet: Secondary | ICD-10-CM | POA: Diagnosis not present

## 2022-01-22 DIAGNOSIS — R2689 Other abnormalities of gait and mobility: Secondary | ICD-10-CM | POA: Diagnosis not present

## 2022-01-22 NOTE — Therapy (Unsigned)
OUTPATIENT PHYSICAL THERAPY TREATMENT NOTE   Patient Name: Calvin [name] [name]: [id number] DOB:09/23/1945, 76 y.o., male Today's Date: 01/22/2022  PCP: Laurey Morale., MD REFERRING PROVIDER: Ludwig Clarks, DO   END OF SESSION:   PT End of Session - 01/22/22 1323     Visit Number 2    Number of Visits 9   8+eval   Date for PT Re-Evaluation 03/30/22   Pushed out due to delay in scheduling.   Authorization Type [employer] MEDICARE    PT Start Time 1315    PT Stop Time 1356    PT Time Calculation (min) 41 min    Equipment Utilized During Treatment Gait belt    Activity Tolerance Patient tolerated treatment well    Behavior During Therapy WFL for tasks assessed/performed             Past Medical History:  Diagnosis Date   Benign prostatic hypertrophy    ED (erectile dysfunction)    Headache(784.0)    Hypertension    Hypogonadism male    Nephrolithiasis    hx of had lithotripsy in 1-10.   Past Surgical History:  Procedure Laterality Date   colonoscopy  10/05/2020   per Dr. Fuller Plan, adenomatous polyps, repeat in 3 yrs   [place]  03/12/2012   Procedure: APPENDECTOMY LAPAROSCOPIC;  Surgeon: Harl Bowie, MD;  Location: [facility];  Service: General;  Laterality: N/A;   STAPLE HEMORRHOIDECTOMY  04/14/2010   per Dr. Johney Maine    TONSILLECTOMY     Patient Active Problem List   Diagnosis Date Noted   Bilateral leg weakness 01/10/2022   Depression with anxiety 12/25/2021   Alcohol abuse 12/25/2021   Low back pain 06/09/2020   COVID-19 virus infection 02/24/2020   Primary osteoarthritis of left knee 04/23/2018   Hypogonadism in male 01/19/2016   Foot swelling 01/19/2016   Insomnia 10/30/2013   INTERNAL HEMORRHOIDS WITH OTHER COMPLICATION 26/71/2458   BPH (benign prostatic hyperplasia) 10/25/2008   NEPHROLITHIASIS, HX OF 10/25/2008   Headache 09/15/2007   Essential hypertension 07/07/2007    REFERRING DIAG: G60.9 (ICD-10-CM) - Idiopathic  peripheral neuropathy   THERAPY DIAG:  Other abnormalities of gait and mobility  Unsteadiness on feet  Muscle weakness (generalized)  Rationale for Evaluation and Treatment Rehabilitation  PERTINENT HISTORY: ETOH abuse, HTN, insomnia, depression, anxiety, L knee OA, R foot excision of growth approx. 1 year ago  Pt has bilateral hearing aides.  Usually wears left, but left seldom works properly.  PRECAUTIONS: Fall  SUBJECTIVE: No changes or falls to report.  Pt states he has lost some weight, weighing 238 pounds as of this morning.  He states he is trying to lose weight.  PAIN:  Are you having pain? No   OBJECTIVE: (objective measures completed at initial evaluation unless otherwise dated)   TODAY'S TREATMENT:  Assessed 2MWT: -160' SBA no AD -Pt ambulates 230' total w/ mild posterior trunk lean (not true retropulsion) and shuffling gait with decreased stride and bilateral arm swing.  Assessed BERG:  [facility] PT Assessment - 01/22/22 1333       Standardized Balance Assessment   Standardized Balance Assessment Berg Balance Test      Berg Balance Test   Sit to Stand Able to stand without using hands and stabilize independently    Standing Unsupported Able to stand safely 2 minutes    Sitting with Back Unsupported but Feet Supported on Floor or Stool Able to sit safely  and securely 2 minutes    Stand to Sit Sits safely with minimal use of hands    Transfers Able to transfer safely, definite need of hands    Standing Unsupported with Eyes Closed Able to stand 10 seconds with supervision    Standing Unsupported with Feet Together Able to place feet together independently and stand for 1 minute with supervision    From Standing, Reach Forward with Outstretched Arm Can reach forward >12 cm safely (5")    From Standing Position, Pick up Object from Floor Unable to pick up shoe, but reaches 2-5 cm (1-2") from shoe and balances independently    From Standing Position, Turn to Look  Behind Over each Shoulder Looks behind from both sides and weight shifts well    Turn 360 Degrees Needs close supervision or verbal cueing   pt has major LOB turning in circle to left   Standing Unsupported, Alternately Place Feet on Step/Stool Needs assistance to keep from falling or unable to try   on second tap pt loses balance posteriorly   Standing Unsupported, One Foot in [employer] balance while stepping or standing    Standing on One Leg Tries to lift leg/unable to hold 3 seconds but remains standing independently    Total Score 36    Berg comment: Significant Fall Risk            Initiated HEP: -SLS x30 sec each LE w/ BUE support -STS x10 w/ cues for anterior weight shifting and controlled lower -Standing march w/ BUE support x20    PATIENT EDUCATION: Education details: Initial HEP.  Would like to trial further gait training w/ AD before assigning formal walking program as pt intermittently uses SPC. Person educated: Patient Education method: Explanation Education comprehension: verbalized understanding     HOME EXERCISE PROGRAM: Access Code: [email or web address] URL: [email or web address] Date: 01/22/2022 Prepared by: Elease Etienne  Exercises - Sit to Stand with Armchair  - 1 x daily - 5-6 x weekly - 2 sets - 10 reps - Standing March with Counter Support  - 1 x daily - 6 x weekly - 2 sets - 20 reps - Standing Single Leg Stance with Counter Support  - 1 x daily - 7 x weekly - 1 sets - 2 reps - 30 seconds hold   GOALS: Goals reviewed with patient? Yes   SHORT TERM GOALS: Target date: 02/16/2022   Pt will be independent with balance HEP and walking program to promote gains outside of clinic. Baseline:  To be established. Goal status: INITIAL   2.  Pt will ambulate>/=200 feet on 2MWT to demonstrate improved endurance for functional tasks in home and community. Baseline: 160' Goal status: INITIAL   3.  Pt will increase BERG balance score to >/=41/56 to  demonstrate improved static balance. Baseline:  36/56 Goal status: INITIAL   4.  Pt will demonstrate TUG of </=12 seconds in order to decrease risk of falls and improve functional mobility using LRAD. Baseline: 13.69 sec no AD, mild retropulsion Goal status: INITIAL   5.  Pt will decrease 5xSTS to </=15 seconds w/ or w/o UE support and improved anterior weight shift in order to demonstrate decreased risk for falls and improved functional bilateral LE strength and power. Baseline:  18.53 sec w/o UE support and use of legs against EOM due retropulsion, uncontrolled descent Goal status: INITIAL   LONG TERM GOALS: Target date: 03/16/2022   Pt will demonstrate independence with floor recovery with verbalization of fall prevention  strategies in order to promote safety in home environment. Baseline:  To be reviewed. Goal status: INITIAL   2.  Pt will increase BERG balance score to >/=46/56 to demonstrate improved static balance. Baseline:  36/56 Goal status: INITIAL   3.  Pt will demonstrate a gait speed of >/=2.62 feet/sec w/ LRAD in order to decrease risk for falls. Baseline: 2.02 ft/sec w/o AD Goal status: INITIAL   4.  Pt will decrease 5xSTS to </=13 seconds with improved anterior weight shift in order to demonstrate decreased risk for falls and improved functional bilateral LE strength and power. Baseline: 18.53 sec no UE, retropulsive Goal status: INITIAL   5.  Pt will ambulate >/=500 feet on level surfaces with LRAD, demonstrating improved anterior weight shift at independent level of assist to promote household and community access. Baseline: various clinic distances, SBA, retropulsive Goal status: INITIAL   ASSESSMENT:   CLINICAL IMPRESSION: Assessed BERG today with pt indicating significant fall risk scoring 36/56.  He is able to ambulate 160' during the 2MWT demonstrating mild posterior trunk lean and decreased stride.  Initiated HEP today for glut strength, low level static  balance, and functional transfers w/ use of BUE for safety and anterior weight shifting.  Will progress HEP and address functional deficits outlined in POC in coming sessions.   OBJECTIVE IMPAIRMENTS Abnormal gait, decreased activity tolerance, decreased balance, decreased endurance, decreased knowledge of use of DME, difficulty walking, decreased safety awareness, increased edema, and impaired sensation.    ACTIVITY LIMITATIONS standing, squatting, stairs, and locomotion level   PARTICIPATION LIMITATIONS: community activity   PERSONAL FACTORS Age, Past/current experiences, Social background, Time since onset of injury/illness/exacerbation, and 3+ comorbidities: ETOH abuse, HTN, depression  are also affecting patient's functional outcome.    REHAB POTENTIAL: Good   CLINICAL DECISION MAKING: Evolving/moderate complexity   EVALUATION COMPLEXITY: Moderate   PLAN: PT FREQUENCY: 1x/week   PT DURATION: 8 weeks   PLANNED INTERVENTIONS: Therapeutic exercises, Therapeutic activity, Neuromuscular re-education, Balance training, Gait training, Patient/Family education, Self Care, Stair training, Vestibular training, DME instructions, and Re-evaluation   PLAN FOR NEXT SESSION: Modify balance HEP prn and initiate walking program-after gait training w/ SPC, gait training w/o AD (try treadmill as well), stair training using rail, anti-retropulsion activities, glut strengthening, SciFit as needed for endurance.   Bary Richard, PT, DPT 01/22/2022, 2:03 PM

## 2022-01-22 NOTE — Patient Instructions (Signed)
Access Code: [email or web address] URL: [email or web address] Date: 01/22/2022 Prepared by: Elease Etienne  Exercises - Sit to Stand with Armchair  - 1 x daily - 5-6 x weekly - 2 sets - 10 reps - Standing March with Counter Support  - 1 x daily - 6 x weekly - 2 sets - 20 reps - Standing Single Leg Stance with Counter Support  - 1 x daily - 7 x weekly - 1 sets - 2 reps - 30 seconds hold

## 2022-01-29 ENCOUNTER — Encounter: Payer: Self-pay | Admitting: Physical Therapy

## 2022-01-29 ENCOUNTER — Ambulatory Visit: Payer: Medicare HMO | Admitting: Physical Therapy

## 2022-01-29 VITALS — BP 113/68 | HR 88

## 2022-01-29 DIAGNOSIS — R600 Localized edema: Secondary | ICD-10-CM | POA: Diagnosis not present

## 2022-01-29 DIAGNOSIS — M6281 Muscle weakness (generalized): Secondary | ICD-10-CM

## 2022-01-29 DIAGNOSIS — R2689 Other abnormalities of gait and mobility: Secondary | ICD-10-CM

## 2022-01-29 DIAGNOSIS — R2681 Unsteadiness on feet: Secondary | ICD-10-CM

## 2022-01-29 NOTE — Therapy (Signed)
OUTPATIENT PHYSICAL THERAPY TREATMENT NOTE   Patient Name: Calvin [name] [name]: [id number] DOB:30-Jun-1945, 76 y.o., male Today's Date: 01/29/2022  PCP: Laurey Morale., MD REFERRING PROVIDER: Ludwig Clarks, DO   END OF SESSION:   PT End of Session - 01/29/22 1411     Visit Number 3    Number of Visits 9   8+eval   Date for PT Re-Evaluation 03/30/22   Pushed out due to delay in scheduling.   Authorization Type [employer] MEDICARE    PT Start Time 1404    PT Stop Time 1445    PT Time Calculation (min) 41 min    Equipment Utilized During Treatment Gait belt    Activity Tolerance Patient tolerated treatment well    Behavior During Therapy WFL for tasks assessed/performed             Past Medical History:  Diagnosis Date   Benign prostatic hypertrophy    ED (erectile dysfunction)    Headache(784.0)    Hypertension    Hypogonadism male    Nephrolithiasis    hx of had lithotripsy in 1-10.   Past Surgical History:  Procedure Laterality Date   colonoscopy  10/05/2020   per Dr. Fuller Plan, adenomatous polyps, repeat in 3 yrs   [place]  03/12/2012   Procedure: APPENDECTOMY LAPAROSCOPIC;  Surgeon: Harl Bowie, MD;  Location: [facility];  Service: General;  Laterality: N/A;   STAPLE HEMORRHOIDECTOMY  04/14/2010   per Dr. Johney Maine    TONSILLECTOMY     Patient Active Problem List   Diagnosis Date Noted   Bilateral leg weakness 01/10/2022   Depression with anxiety 12/25/2021   Alcohol abuse 12/25/2021   Low back pain 06/09/2020   COVID-19 virus infection 02/24/2020   Primary osteoarthritis of left knee 04/23/2018   Hypogonadism in male 01/19/2016   Foot swelling 01/19/2016   Insomnia 10/30/2013   INTERNAL HEMORRHOIDS WITH OTHER COMPLICATION 78/24/2353   BPH (benign prostatic hyperplasia) 10/25/2008   NEPHROLITHIASIS, HX OF 10/25/2008   Headache 09/15/2007   Essential hypertension 07/07/2007    REFERRING DIAG: G60.9 (ICD-10-CM) - Idiopathic  peripheral neuropathy   THERAPY DIAG:  Other abnormalities of gait and mobility  Unsteadiness on feet  Muscle weakness (generalized)  Rationale for Evaluation and Treatment Rehabilitation  PERTINENT HISTORY: ETOH abuse, HTN, insomnia, depression, anxiety, L knee OA, R foot excision of growth approx. 1 year ago  Pt has bilateral hearing aides.  Usually wears left, but left seldom works properly.  PRECAUTIONS: Fall  SUBJECTIVE: Pt states he is not having a great day today as he has no energy.  He states he got up at [place] yesterday and ate a bowl of cereal and drank a cup of coffee and slept until 9pm.  He is unsure what has changed.  He had a headache every day last week just about.  Otherwise, no other issues.  PAIN:  Are you having pain? No   OBJECTIVE: (objective measures completed at initial evaluation unless otherwise dated) VITALS (LUE in sitting): Today's Vitals   01/29/22 1409  BP: 113/68  Pulse: 88   TODAY'S TREATMENT:  Pt requires minA to walk ~90' into gym to mat table from lobby w/ cues to use [facility] and to have pt stop and readjust steps as he demonstrates repeated bouts of festination-like gait.  This is a change from last session.  -Supine bridging 2x15 -Seated ball rollouts (fwd and lat.) for anterior weight shifting x10  each direction, cued to return to upright between each rep -4" step taps x20, several instances of toe catch w/o correction of motor planning despite cues, cued for anterior weight shift to maintain balance, CGA -SciFit L7.0 x8 minutes using BUE/BLE for reciprocal mobility, aerobic tolerance, and generalized strengthening.  Pt given goal of 70 steps/min w/ ... Total steps achieved.  -Ambulated 50' to [place] and 140' to lobby at end of session w/ CGA-minA w/ cues for proper sequencing and engagement to use of [place] safely.    PATIENT EDUCATION: Education details: Discussed doing single addition to HEP on days patient feels balance is worse such as  today in order to promote safety as pt does not have supervision available. Person educated: Patient Education method: Explanation Education comprehension: verbalized understanding     HOME EXERCISE PROGRAM: Access Code: [email or web address] URL: [email or web address] Date: 01/29/2022 Prepared by: Elease Etienne  Exercises - Sit to Stand with Armchair  - 1 x daily - 5-6 x weekly - 2 sets - 10 reps - Standing March with Counter Support  - 1 x daily - 6 x weekly - 2 sets - 20 reps - Standing Single Leg Stance with Counter Support  - 1 x daily - 7 x weekly - 1 sets - 2 reps - 30 seconds hold - Supine Bridge  - 1 x daily - 5 x weekly - 3 sets - 15 reps   GOALS: Goals reviewed with patient? Yes   SHORT TERM GOALS: Target date: 02/16/2022   Pt will be independent with balance HEP and walking program to promote gains outside of clinic. Baseline:  To be established. Goal status: INITIAL   2.  Pt will ambulate>/=200 feet on 2MWT to demonstrate improved endurance for functional tasks in home and community. Baseline: 160' Goal status: INITIAL   3.  Pt will increase BERG balance score to >/=41/56 to demonstrate improved static balance. Baseline:  36/56 Goal status: INITIAL   4.  Pt will demonstrate TUG of </=12 seconds in order to decrease risk of falls and improve functional mobility using LRAD. Baseline: 13.69 sec no AD, mild retropulsion Goal status: INITIAL   5.  Pt will decrease 5xSTS to </=15 seconds w/ or w/o UE support and improved anterior weight shift in order to demonstrate decreased risk for falls and improved functional bilateral LE strength and power. Baseline:  18.53 sec w/o UE support and use of legs against EOM due retropulsion, uncontrolled descent Goal status: INITIAL   LONG TERM GOALS: Target date: 03/16/2022   Pt will demonstrate independence with floor recovery with verbalization of fall prevention strategies in order to promote safety in home  environment. Baseline:  To be reviewed. Goal status: INITIAL   2.  Pt will increase BERG balance score to >/=46/56 to demonstrate improved static balance. Baseline:  36/56 Goal status: INITIAL   3.  Pt will demonstrate a gait speed of >/=2.62 feet/sec w/ LRAD in order to decrease risk for falls. Baseline: 2.02 ft/sec w/o AD Goal status: INITIAL   4.  Pt will decrease 5xSTS to </=13 seconds with improved anterior weight shift in order to demonstrate decreased risk for falls and improved functional bilateral LE strength and power. Baseline: 18.53 sec no UE, retropulsive Goal status: INITIAL   5.  Pt will ambulate >/=500 feet on level surfaces with LRAD, demonstrating improved anterior weight shift at independent level of assist to promote household and community access. Baseline: various clinic distances, SBA, retropulsive Goal status: INITIAL   ASSESSMENT:  CLINICAL IMPRESSION: Focus of skilled session on addressing use of SPC with gait at onset and end of session to promote safe independence with ambulation as pt demonstrates festination-like gait today requiring increased physical assist.  Progressed activity to address anterior weight shift, SLS, and generalizing strengthening.  He is making modest progress towards goals at this time and continues to benefit from skilled PT to address safety awareness and deficits outline in POC.   OBJECTIVE IMPAIRMENTS Abnormal gait, decreased activity tolerance, decreased balance, decreased endurance, decreased knowledge of use of DME, difficulty walking, decreased safety awareness, increased edema, and impaired sensation.    ACTIVITY LIMITATIONS standing, squatting, stairs, and locomotion level   PARTICIPATION LIMITATIONS: community activity   PERSONAL FACTORS Age, Past/current experiences, Social background, Time since onset of injury/illness/exacerbation, and 3+ comorbidities: ETOH abuse, HTN, depression  are also affecting patient's functional  outcome.    REHAB POTENTIAL: Good   CLINICAL DECISION MAKING: Evolving/moderate complexity   EVALUATION COMPLEXITY: Moderate   PLAN: PT FREQUENCY: 1x/week   PT DURATION: 8 weeks   PLANNED INTERVENTIONS: Therapeutic exercises, Therapeutic activity, Neuromuscular re-education, Balance training, Gait training, Patient/Family education, Self Care, Stair training, Vestibular training, DME instructions, and Re-evaluation   PLAN FOR NEXT SESSION: Modify balance HEP prn and initiate walking program-after gait training w/ SPC, gait training w/o AD (try treadmill as well), stair training using rail, anti-retropulsion activities-ball kicks (large >soccer), slam balls, glut strengthening-lateral stepping at counter with band, SciFit as needed for endurance.   Bary Richard, PT, DPT 01/29/2022, 2:44 PM

## 2022-01-29 NOTE — Patient Instructions (Signed)
Access Code: [email or web address] URL: [email or web address] Date: 01/29/2022 Prepared by: Elease Etienne  Exercises - Sit to Stand with Armchair  - 1 x daily - 5-6 x weekly - 2 sets - 10 reps - Standing March with Counter Support  - 1 x daily - 6 x weekly - 2 sets - 20 reps - Standing Single Leg Stance with Counter Support  - 1 x daily - 7 x weekly - 1 sets - 2 reps - 30 seconds hold - Supine Bridge  - 1 x daily - 5 x weekly - 3 sets - 15 reps

## 2022-02-03 ENCOUNTER — Other Ambulatory Visit: Payer: Self-pay | Admitting: Family Medicine

## 2022-02-05 ENCOUNTER — Ambulatory Visit: Payer: Medicare HMO | Admitting: Physical Therapy

## 2022-02-06 ENCOUNTER — Ambulatory Visit (INDEPENDENT_AMBULATORY_CARE_PROVIDER_SITE_OTHER): Payer: Medicare HMO | Admitting: Family Medicine

## 2022-02-06 ENCOUNTER — Encounter: Payer: Self-pay | Admitting: Family Medicine

## 2022-02-06 ENCOUNTER — Ambulatory Visit: Payer: Medicare HMO | Admitting: Neurology

## 2022-02-06 VITALS — BP 128/82 | HR 87 | Temp 98.8°F | Wt 230.0 lb

## 2022-02-06 DIAGNOSIS — E538 Deficiency of other specified B group vitamins: Secondary | ICD-10-CM | POA: Diagnosis not present

## 2022-02-06 DIAGNOSIS — S39012A Strain of muscle, fascia and tendon of lower back, initial encounter: Secondary | ICD-10-CM | POA: Diagnosis not present

## 2022-02-06 DIAGNOSIS — R29898 Other symptoms and signs involving the musculoskeletal system: Secondary | ICD-10-CM

## 2022-02-06 MED ORDER — CYCLOBENZAPRINE HCL 10 MG PO TABS: 10.0000 mg | ORAL_TABLET | Freq: Three times a day (TID) | ORAL | 2 refills | Status: DC | PRN

## 2022-02-06 NOTE — Progress Notes (Signed)
   Subjective:    Patient ID: Calvin [name], male    DOB: 11-16-1945, 76 y.o.   [name]: [id number]  HPI Here to follow up on leg weakness. He saw Dr. Carles Collet on 01-11-21, and she confirmed his bradykinesia and altered gait. She felt he did not meet the criteria for Parkinson's disease however. Since then he has been going to PT, but he says this has not helped him at all. He has also been on weekly B12 shots as directed. He has continued to refrain from alcohol. The other issue is he pulled a muscle in his back 3 days ago. He is not sure how he did this. He says his back tightens up like this about 3 times a year. It then takes anywhere from a few days to a week to get over it. He has been applying heat.    Review of Systems  Constitutional:  Positive for fatigue.  Respiratory: Negative.    Cardiovascular: Negative.   Musculoskeletal:  Positive for back pain.  Neurological:  Positive for weakness. Negative for numbness.       Objective:   Physical Exam Constitutional:      Comments: He walks very slowly, in obvious pain from his back   Cardiovascular:     Rate and Rhythm: Normal rate and regular rhythm.     Pulses: Normal pulses.     Heart sounds: Normal heart sounds.  Pulmonary:     Effort: Pulmonary effort is normal.     Breath sounds: Normal breath sounds.  Musculoskeletal:     Comments: There is a lot of spasm in the lower back. This is tender, and ROM is reduced   Neurological:     Mental Status: He is alert.           Assessment & Plan:  For the leg weakness, he will continue to go to PT. For the B12 deficiency, he will return in a month to recheck his level. For the acute lumbar strain, he can try Flexeril 10 mg as needed.  Alysia Penna, MD

## 2022-02-08 ENCOUNTER — Telehealth: Payer: Self-pay | Admitting: Neurology

## 2022-02-08 ENCOUNTER — Other Ambulatory Visit: Payer: Self-pay

## 2022-02-08 DIAGNOSIS — R251 Tremor, unspecified: Secondary | ICD-10-CM

## 2022-02-08 NOTE — Telephone Encounter (Signed)
Patient called again about this question.

## 2022-02-08 NOTE — Telephone Encounter (Signed)
Called Pt and He would like to go forward with the [place].

## 2022-02-08 NOTE — Telephone Encounter (Signed)
Pt would like to talk with someone about the test Dr.Tat was talking about. He would like to know if he takes the test, will it show up, even if its in the early stages

## 2022-02-12 ENCOUNTER — Ambulatory Visit: Payer: Medicare HMO | Admitting: Physical Therapy

## 2022-02-20 ENCOUNTER — Ambulatory Visit: Payer: Medicare HMO | Attending: Neurology | Admitting: Physical Therapy

## 2022-02-21 ENCOUNTER — Other Ambulatory Visit (INDEPENDENT_AMBULATORY_CARE_PROVIDER_SITE_OTHER): Payer: Medicare HMO

## 2022-02-21 ENCOUNTER — Encounter: Payer: Self-pay | Admitting: Family Medicine

## 2022-02-21 ENCOUNTER — Ambulatory Visit (INDEPENDENT_AMBULATORY_CARE_PROVIDER_SITE_OTHER): Payer: Medicare HMO | Admitting: Family Medicine

## 2022-02-21 VITALS — BP 126/82 | HR 108 | Temp 98.2°F | Wt 221.0 lb

## 2022-02-21 DIAGNOSIS — R29898 Other symptoms and signs involving the musculoskeletal system: Secondary | ICD-10-CM

## 2022-02-21 DIAGNOSIS — F1010 Alcohol abuse, uncomplicated: Secondary | ICD-10-CM | POA: Diagnosis not present

## 2022-02-21 DIAGNOSIS — R197 Diarrhea, unspecified: Secondary | ICD-10-CM

## 2022-02-21 DIAGNOSIS — R7401 Elevation of levels of liver transaminase levels: Secondary | ICD-10-CM | POA: Diagnosis not present

## 2022-02-21 DIAGNOSIS — R69 Illness, unspecified: Secondary | ICD-10-CM | POA: Diagnosis not present

## 2022-02-21 DIAGNOSIS — I10 Essential (primary) hypertension: Secondary | ICD-10-CM | POA: Diagnosis not present

## 2022-02-21 LAB — AMMONIA: Ammonia: 30 umol/L (ref 11–35)

## 2022-02-21 NOTE — Progress Notes (Signed)
   Subjective:    Patient ID: Calvin [name], male    DOB: [birth year], 76 y.o.   [name]: [id number]  HPI Here with a friend for 5 days of diarrhea, nausea without vomiting, and weakness. No fever or ST or cough. He feels better today and the last stool he passed was last night. He has been able to eat a microwave dinner tray today and he is drinking Gatorade. Also he brings with him a list of lab tests that his daughter would like [place] to run. The last time he had labs drawn was on 12-25-21 when his AST had jumped up to 84 and his ALT was up to 123. We had no explanation for these at that time. He says he has not consumed any alcohol for months now.    Review of Systems  Constitutional:  Positive for fatigue. Negative for chills, diaphoresis and fever.  HENT: Negative.    Eyes: Negative.   Respiratory: Negative.    Cardiovascular: Negative.   Gastrointestinal:  Positive for diarrhea and nausea. Negative for abdominal distention, abdominal pain, blood in stool, constipation and vomiting.  Genitourinary: Negative.   Neurological:  Positive for weakness.       Objective:   Physical Exam Constitutional:      Comments: In a wheelchair, he is quite weak   Eyes:     General: No scleral icterus.    Conjunctiva/sclera: Conjunctivae normal.  Cardiovascular:     Rate and Rhythm: Normal rate and regular rhythm.     Pulses: Normal pulses.     Heart sounds: Normal heart sounds.  Pulmonary:     Effort: Pulmonary effort is normal.     Breath sounds: Normal breath sounds.  Abdominal:     General: Abdomen is flat. Bowel sounds are normal. There is no distension.     Palpations: Abdomen is soft. There is no mass.     Tenderness: There is no abdominal tenderness. There is no guarding or rebound.     Hernia: No hernia is present.  Lymphadenopathy:     Cervical: No cervical adenopathy.  Neurological:     Mental Status: He is alert and oriented to person, place, and time.           Assessment &  Plan:  He likely is getting over a viral enteritis. He will drink fluids and advance his diet as tolerated. We will send him for labs to recheck a liver panel along with BMET, Mg, tests for EBV and Lyme, etc.  Alysia Penna, MD

## 2022-02-22 LAB — HEPATIC FUNCTION PANEL
ALT: 24 U/L (ref 0–53)
AST: 18 U/L (ref 0–37)
Albumin: 4.1 g/dL (ref 3.5–5.2)
Alkaline Phosphatase: 143 U/L — ABNORMAL HIGH (ref 39–117)
Bilirubin, Direct: 0.2 mg/dL (ref 0.0–0.3)
Total Bilirubin: 0.7 mg/dL (ref 0.2–1.2)
Total Protein: 6.9 g/dL (ref 6.0–8.3)

## 2022-02-22 LAB — CBC WITH DIFFERENTIAL/PLATELET
Basophils Absolute: 0.1 10*3/uL (ref 0.0–0.1)
Basophils Relative: 0.8 % (ref 0.0–3.0)
Eosinophils Absolute: 0.1 10*3/uL (ref 0.0–0.7)
Eosinophils Relative: 0.7 % (ref 0.0–5.0)
HCT: 44.1 % (ref 39.0–52.0)
Hemoglobin: 15.3 g/dL (ref 13.0–17.0)
Lymphocytes Relative: 13.4 % (ref 12.0–46.0)
Lymphs Abs: 1.3 10*3/uL (ref 0.7–4.0)
MCHC: 34.8 g/dL (ref 30.0–36.0)
MCV: 98.9 fl (ref 78.0–100.0)
Monocytes Absolute: 1.2 10*3/uL — ABNORMAL HIGH (ref 0.1–1.0)
Monocytes Relative: 12.1 % — ABNORMAL HIGH (ref 3.0–12.0)
Neutro Abs: 7.4 10*3/uL (ref 1.4–7.7)
Neutrophils Relative %: 73 % (ref 43.0–77.0)
Platelets: 258 10*3/uL (ref 150.0–400.0)
RBC: 4.46 Mil/uL (ref 4.22–5.81)
RDW: 12.5 % (ref 11.5–15.5)
WBC: 10.1 10*3/uL (ref 4.0–10.5)

## 2022-02-22 LAB — HEPATITIS B SURFACE ANTIGEN: Hepatitis B Surface Ag: NONREACTIVE

## 2022-02-22 LAB — IBC + FERRITIN
Ferritin: 568.6 ng/mL — ABNORMAL HIGH (ref 22.0–322.0)
Iron: 42 ug/dL (ref 42–165)
Saturation Ratios: 17.6 % — ABNORMAL LOW (ref 20.0–50.0)
TIBC: 238 ug/dL — ABNORMAL LOW (ref 250.0–450.0)
Transferrin: 170 mg/dL — ABNORMAL LOW (ref 212.0–360.0)

## 2022-02-22 LAB — BASIC METABOLIC PANEL
BUN: 24 mg/dL — ABNORMAL HIGH (ref 6–23)
CO2: 20 mEq/L (ref 19–32)
Calcium: 9.8 mg/dL (ref 8.4–10.5)
Chloride: 101 mEq/L (ref 96–112)
Creatinine, Ser: 1.03 mg/dL (ref 0.40–1.50)
GFR: 71.5 mL/min (ref >60.00)
Glucose, Bld: 99 mg/dL (ref 70–99)
Potassium: 3.5 mEq/L (ref 3.5–5.1)
Sodium: 137 mEq/L (ref 135–145)

## 2022-02-22 LAB — SEDIMENTATION RATE: Sed Rate: 41 mm/hr — ABNORMAL HIGH (ref 0–20)

## 2022-02-22 LAB — MAGNESIUM: Magnesium: 1.5 mg/dL (ref 1.5–2.5)

## 2022-02-22 LAB — B. BURGDORFI ANTIBODIES: B burgdorferi Ab IgG+IgM: <0.9 index

## 2022-02-22 LAB — HEPATITIS C ANTIBODY: Hepatitis C Ab: NONREACTIVE

## 2022-02-22 LAB — VITAMIN B12: Vitamin B-12: 669 pg/mL (ref 211–911)

## 2022-02-22 LAB — EPSTEIN-BARR VIRUS VCA, IGM: EBV VCA IgM: <36 U/mL

## 2022-02-23 ENCOUNTER — Encounter (HOSPITAL_COMMUNITY)
Admission: RE | Admit: 2022-02-23 | Discharge: 2022-02-23 | Disposition: A | Discharge status: Home / Self Care | Payer: Medicare HMO | Source: Ambulatory Visit | Attending: Neurology | Admitting: Neurology

## 2022-02-23 DIAGNOSIS — R197 Diarrhea, unspecified: Secondary | ICD-10-CM | POA: Diagnosis not present

## 2022-02-23 DIAGNOSIS — N179 Acute kidney failure, unspecified: Secondary | ICD-10-CM | POA: Diagnosis not present

## 2022-02-23 DIAGNOSIS — R251 Tremor, unspecified: Secondary | ICD-10-CM | POA: Insufficient documentation

## 2022-02-23 DIAGNOSIS — N12 Tubulo-interstitial nephritis, not specified as acute or chronic: Secondary | ICD-10-CM | POA: Diagnosis not present

## 2022-02-23 DIAGNOSIS — N10 Acute tubulo-interstitial nephritis: Secondary | ICD-10-CM | POA: Diagnosis not present

## 2022-02-23 DIAGNOSIS — R000 Tachycardia, unspecified: Secondary | ICD-10-CM | POA: Diagnosis not present

## 2022-02-23 DIAGNOSIS — N1330 Unspecified hydronephrosis: Secondary | ICD-10-CM | POA: Diagnosis not present

## 2022-02-23 MED ORDER — POTASSIUM IODIDE (ANTIDOTE) 130 MG PO TABS: 130.0000 mg | ORAL_TABLET | Freq: Once | ORAL | Status: DC

## 2022-02-23 MED ORDER — IOFLUPANE I 123 185 MBQ/2.5ML IV SOLN
4.8000 | Freq: Once | INTRAVENOUS | Status: AC | PRN
  Administered 2022-02-23: 4.8 via INTRAVENOUS
  Filled 2022-02-23: qty 5

## 2022-02-23 MED ORDER — POTASSIUM IODIDE (ANTIDOTE) 130 MG PO TABS
ORAL_TABLET | ORAL | Status: AC
  Administered 2022-02-23: 130 mg via ORAL
  Filled 2022-02-23: qty 1

## 2022-02-24 ENCOUNTER — Other Ambulatory Visit: Payer: Self-pay

## 2022-02-24 ENCOUNTER — Inpatient Hospital Stay (HOSPITAL_COMMUNITY)
Admission: EM | Admit: 2022-02-24 | Discharge: 2022-03-02 | DRG: 694 | Disposition: A | Discharge status: Skilled Nursing Facility | Payer: Medicare HMO | Attending: Family Medicine | Admitting: Family Medicine

## 2022-02-24 ENCOUNTER — Encounter (HOSPITAL_COMMUNITY): Payer: Self-pay | Admitting: Emergency Medicine

## 2022-02-24 DIAGNOSIS — N12 Tubulo-interstitial nephritis, not specified as acute or chronic: Secondary | ICD-10-CM | POA: Diagnosis present

## 2022-02-24 DIAGNOSIS — R339 Retention of urine, unspecified: Principal | ICD-10-CM

## 2022-02-24 DIAGNOSIS — N401 Enlarged prostate with lower urinary tract symptoms: Secondary | ICD-10-CM | POA: Diagnosis present

## 2022-02-24 DIAGNOSIS — E876 Hypokalemia: Secondary | ICD-10-CM | POA: Diagnosis present

## 2022-02-24 DIAGNOSIS — N179 Acute kidney failure, unspecified: Secondary | ICD-10-CM

## 2022-02-24 DIAGNOSIS — Z79899 Other long term (current) drug therapy: Secondary | ICD-10-CM

## 2022-02-24 DIAGNOSIS — R3129 Other microscopic hematuria: Secondary | ICD-10-CM | POA: Diagnosis present

## 2022-02-24 DIAGNOSIS — R112 Nausea with vomiting, unspecified: Secondary | ICD-10-CM

## 2022-02-24 DIAGNOSIS — R296 Repeated falls: Secondary | ICD-10-CM | POA: Diagnosis present

## 2022-02-24 DIAGNOSIS — N10 Acute tubulo-interstitial nephritis: Secondary | ICD-10-CM

## 2022-02-24 DIAGNOSIS — N429 Disorder of prostate, unspecified: Secondary | ICD-10-CM

## 2022-02-24 DIAGNOSIS — I700 Atherosclerosis of aorta: Secondary | ICD-10-CM | POA: Diagnosis present

## 2022-02-24 DIAGNOSIS — N138 Other obstructive and reflux uropathy: Secondary | ICD-10-CM | POA: Diagnosis present

## 2022-02-24 DIAGNOSIS — Z87442 Personal history of urinary calculi: Secondary | ICD-10-CM

## 2022-02-24 DIAGNOSIS — R197 Diarrhea, unspecified: Secondary | ICD-10-CM

## 2022-02-24 DIAGNOSIS — Z823 Family history of stroke: Secondary | ICD-10-CM

## 2022-02-24 DIAGNOSIS — I10 Essential (primary) hypertension: Secondary | ICD-10-CM | POA: Diagnosis present

## 2022-02-24 DIAGNOSIS — C61 Malignant neoplasm of prostate: Secondary | ICD-10-CM | POA: Diagnosis present

## 2022-02-24 DIAGNOSIS — R000 Tachycardia, unspecified: Secondary | ICD-10-CM | POA: Diagnosis not present

## 2022-02-24 DIAGNOSIS — A084 Viral intestinal infection, unspecified: Secondary | ICD-10-CM | POA: Diagnosis present

## 2022-02-24 DIAGNOSIS — E785 Hyperlipidemia, unspecified: Secondary | ICD-10-CM | POA: Diagnosis present

## 2022-02-24 DIAGNOSIS — Z8249 Family history of ischemic heart disease and other diseases of the circulatory system: Secondary | ICD-10-CM

## 2022-02-24 DIAGNOSIS — N1330 Unspecified hydronephrosis: Principal | ICD-10-CM | POA: Diagnosis present

## 2022-02-24 DIAGNOSIS — Z8042 Family history of malignant neoplasm of prostate: Secondary | ICD-10-CM

## 2022-02-24 LAB — COMPREHENSIVE METABOLIC PANEL
ALT: 24 U/L (ref 0–44)
AST: 21 U/L (ref 15–41)
Albumin: 3.8 g/dL (ref 3.5–5.0)
Alkaline Phosphatase: 127 U/L — ABNORMAL HIGH (ref 38–126)
Anion gap: 20 — ABNORMAL HIGH (ref 5–15)
BUN: 32 mg/dL — ABNORMAL HIGH (ref 8–23)
CO2: 17 mmol/L — ABNORMAL LOW (ref 22–32)
Calcium: 9.7 mg/dL (ref 8.9–10.3)
Chloride: 99 mmol/L (ref 98–111)
Creatinine, Ser: 1.97 mg/dL — ABNORMAL HIGH (ref 0.61–1.24)
GFR, Estimated: 35 mL/min — ABNORMAL LOW (ref >60)
Glucose, Bld: 117 mg/dL — ABNORMAL HIGH (ref 70–99)
Potassium: 3 mmol/L — ABNORMAL LOW (ref 3.5–5.1)
Sodium: 136 mmol/L (ref 135–145)
Total Bilirubin: 1.9 mg/dL — ABNORMAL HIGH (ref 0.3–1.2)
Total Protein: 7.4 g/dL (ref 6.5–8.1)

## 2022-02-24 LAB — CBC WITH DIFFERENTIAL/PLATELET
Abs Immature Granulocytes: 0.05 10*3/uL (ref 0.00–0.07)
Basophils Absolute: 0 10*3/uL (ref 0.0–0.1)
Basophils Relative: 0 %
Eosinophils Absolute: 0 10*3/uL (ref 0.0–0.5)
Eosinophils Relative: 0 %
HCT: 43.1 % (ref 39.0–52.0)
Hemoglobin: 16 g/dL (ref 13.0–17.0)
Immature Granulocytes: 0 %
Lymphocytes Relative: 7 %
Lymphs Abs: 0.9 10*3/uL (ref 0.7–4.0)
MCH: 35.2 pg — ABNORMAL HIGH (ref 26.0–34.0)
MCHC: 37.1 g/dL — ABNORMAL HIGH (ref 30.0–36.0)
MCV: 94.7 fL (ref 80.0–100.0)
Monocytes Absolute: 1.7 10*3/uL — ABNORMAL HIGH (ref 0.1–1.0)
Monocytes Relative: 12 %
Neutro Abs: 11 10*3/uL — ABNORMAL HIGH (ref 1.7–7.7)
Neutrophils Relative %: 81 %
Platelets: 282 10*3/uL (ref 150–400)
RBC: 4.55 MIL/uL (ref 4.22–5.81)
RDW: 11.8 % (ref 11.5–15.5)
WBC: 13.6 10*3/uL — ABNORMAL HIGH (ref 4.0–10.5)
nRBC: 0 % (ref 0.0–0.2)

## 2022-02-24 LAB — LIPASE, BLOOD: Lipase: 26 U/L (ref 11–51)

## 2022-02-24 NOTE — ED Triage Notes (Signed)
Patient reports multiple diarrhea with nausea onset last week ; ermesis today and mild upper abdominal pain .

## 2022-02-24 NOTE — ED Notes (Addendum)
Pt's ex-wife, Frances Furbish, drove him here.  She notified staff that she needs to go home to take care of her mother.  She notified staff that pt has not been diagnosed with dementia, but he has been forgetting things.  Recently tested for Parkinsons.  Awaiting dx.  Peggy stated to call pt's daughter, Lorriane Shire, with any info.

## 2022-02-24 NOTE — ED Notes (Signed)
Pt was provided with urine specimen cup. Pt did not want to try and give sample and stated "I do not think you will be able to get a sample from me tonight."

## 2022-02-25 ENCOUNTER — Emergency Department (HOSPITAL_COMMUNITY): Payer: Medicare HMO

## 2022-02-25 ENCOUNTER — Encounter (HOSPITAL_COMMUNITY): Payer: Self-pay | Admitting: Internal Medicine

## 2022-02-25 DIAGNOSIS — R3129 Other microscopic hematuria: Secondary | ICD-10-CM | POA: Diagnosis not present

## 2022-02-25 DIAGNOSIS — E785 Hyperlipidemia, unspecified: Secondary | ICD-10-CM | POA: Diagnosis not present

## 2022-02-25 DIAGNOSIS — N429 Disorder of prostate, unspecified: Secondary | ICD-10-CM | POA: Diagnosis not present

## 2022-02-25 DIAGNOSIS — N12 Tubulo-interstitial nephritis, not specified as acute or chronic: Secondary | ICD-10-CM | POA: Diagnosis not present

## 2022-02-25 DIAGNOSIS — E876 Hypokalemia: Secondary | ICD-10-CM | POA: Diagnosis not present

## 2022-02-25 DIAGNOSIS — A084 Viral intestinal infection, unspecified: Secondary | ICD-10-CM | POA: Diagnosis not present

## 2022-02-25 DIAGNOSIS — I700 Atherosclerosis of aorta: Secondary | ICD-10-CM | POA: Diagnosis not present

## 2022-02-25 DIAGNOSIS — I10 Essential (primary) hypertension: Secondary | ICD-10-CM | POA: Diagnosis not present

## 2022-02-25 DIAGNOSIS — C61 Malignant neoplasm of prostate: Secondary | ICD-10-CM | POA: Diagnosis not present

## 2022-02-25 DIAGNOSIS — R197 Diarrhea, unspecified: Secondary | ICD-10-CM | POA: Diagnosis not present

## 2022-02-25 DIAGNOSIS — N179 Acute kidney failure, unspecified: Secondary | ICD-10-CM | POA: Diagnosis not present

## 2022-02-25 DIAGNOSIS — R296 Repeated falls: Secondary | ICD-10-CM | POA: Diagnosis not present

## 2022-02-25 DIAGNOSIS — Z79899 Other long term (current) drug therapy: Secondary | ICD-10-CM | POA: Diagnosis not present

## 2022-02-25 DIAGNOSIS — N1330 Unspecified hydronephrosis: Secondary | ICD-10-CM | POA: Diagnosis not present

## 2022-02-25 DIAGNOSIS — Z823 Family history of stroke: Secondary | ICD-10-CM | POA: Diagnosis not present

## 2022-02-25 DIAGNOSIS — R339 Retention of urine, unspecified: Secondary | ICD-10-CM | POA: Diagnosis not present

## 2022-02-25 DIAGNOSIS — Z8042 Family history of malignant neoplasm of prostate: Secondary | ICD-10-CM | POA: Diagnosis not present

## 2022-02-25 DIAGNOSIS — N10 Acute tubulo-interstitial nephritis: Secondary | ICD-10-CM

## 2022-02-25 DIAGNOSIS — Z8249 Family history of ischemic heart disease and other diseases of the circulatory system: Secondary | ICD-10-CM | POA: Diagnosis not present

## 2022-02-25 DIAGNOSIS — N138 Other obstructive and reflux uropathy: Secondary | ICD-10-CM | POA: Diagnosis not present

## 2022-02-25 DIAGNOSIS — Z87442 Personal history of urinary calculi: Secondary | ICD-10-CM | POA: Diagnosis not present

## 2022-02-25 DIAGNOSIS — N401 Enlarged prostate with lower urinary tract symptoms: Secondary | ICD-10-CM | POA: Diagnosis not present

## 2022-02-25 HISTORY — DX: Tubulo-interstitial nephritis, not specified as acute or chronic: N12

## 2022-02-25 LAB — BASIC METABOLIC PANEL
Anion gap: 13 (ref 5–15)
BUN: 38 mg/dL — ABNORMAL HIGH (ref 8–23)
CO2: 21 mmol/L — ABNORMAL LOW (ref 22–32)
Calcium: 8.9 mg/dL (ref 8.9–10.3)
Chloride: 101 mmol/L (ref 98–111)
Creatinine, Ser: 2.27 mg/dL — ABNORMAL HIGH (ref 0.61–1.24)
GFR, Estimated: 30 mL/min — ABNORMAL LOW (ref >60)
Glucose, Bld: 118 mg/dL — ABNORMAL HIGH (ref 70–99)
Potassium: 3.1 mmol/L — ABNORMAL LOW (ref 3.5–5.1)
Sodium: 135 mmol/L (ref 135–145)

## 2022-02-25 LAB — URINALYSIS, ROUTINE W REFLEX MICROSCOPIC
Bilirubin Urine: NEGATIVE
Glucose, UA: NEGATIVE mg/dL
Ketones, ur: 5 mg/dL — AB
Nitrite: NEGATIVE
Protein, ur: 30 mg/dL — AB
RBC / HPF: >50 RBC/hpf — ABNORMAL HIGH (ref 0–5)
Specific Gravity, Urine: 1.014 (ref 1.005–1.030)
WBC, UA: >50 WBC/hpf — ABNORMAL HIGH (ref 0–5)
pH: 5 (ref 5.0–8.0)

## 2022-02-25 LAB — LACTIC ACID, PLASMA: Lactic Acid, Venous: 1.2 mmol/L (ref 0.5–1.9)

## 2022-02-25 MED ORDER — LOPERAMIDE HCL 2 MG PO CAPS
2.0000 mg | ORAL_CAPSULE | ORAL | Status: DC | PRN
Start: 2022-02-25 — End: 2022-03-02

## 2022-02-25 MED ORDER — LACTATED RINGERS IV BOLUS
1000.0000 mL | Freq: Once | INTRAVENOUS | Status: AC
  Administered 2022-02-25: 1000 mL via INTRAVENOUS

## 2022-02-25 MED ORDER — HYDROMORPHONE HCL 1 MG/ML IJ SOLN: 0.5000 mg | INTRAMUSCULAR | Status: DC | PRN

## 2022-02-25 MED ORDER — IOHEXOL 350 MG/ML SOLN
75.0000 mL | Freq: Once | INTRAVENOUS | Status: AC | PRN
  Administered 2022-02-25: 75 mL via INTRAVENOUS

## 2022-02-25 MED ORDER — ONDANSETRON HCL 4 MG PO TABS: 4.0000 mg | ORAL_TABLET | Freq: Four times a day (QID) | ORAL | Status: DC | PRN

## 2022-02-25 MED ORDER — SODIUM CHLORIDE 0.9 % IV SOLN
2.0000 g | Freq: Once | INTRAVENOUS | Status: AC
  Administered 2022-02-25: 2 g via INTRAVENOUS
  Filled 2022-02-25: qty 20

## 2022-02-25 MED ORDER — ACETAMINOPHEN 325 MG PO TABS: 650.0000 mg | ORAL_TABLET | Freq: Four times a day (QID) | ORAL | Status: DC | PRN

## 2022-02-25 MED ORDER — SERTRALINE HCL 100 MG PO TABS
100.0000 mg | ORAL_TABLET | Freq: Every day | ORAL | Status: DC
  Administered 2022-02-25 – 2022-03-02 (×6): 100 mg via ORAL
  Filled 2022-02-25 (×6): qty 1

## 2022-02-25 MED ORDER — FINASTERIDE 5 MG PO TABS
5.0000 mg | ORAL_TABLET | Freq: Every day | ORAL | Status: DC
  Administered 2022-02-25 – 2022-03-02 (×6): 5 mg via ORAL
  Filled 2022-02-25 (×6): qty 1

## 2022-02-25 MED ORDER — DIAZEPAM 2 MG PO TABS: 2.0000 mg | ORAL_TABLET | Freq: Two times a day (BID) | ORAL | Status: DC | PRN

## 2022-02-25 MED ORDER — HEPARIN SODIUM (PORCINE) 5000 UNIT/ML IJ SOLN
5000.0000 [IU] | Freq: Three times a day (TID) | INTRAMUSCULAR | Status: DC
  Administered 2022-02-25 – 2022-03-02 (×16): 5000 [IU] via SUBCUTANEOUS
  Filled 2022-02-25 (×16): qty 1

## 2022-02-25 MED ORDER — POTASSIUM CHLORIDE 10 MEQ/100ML IV SOLN
10.0000 meq | Freq: Once | INTRAVENOUS | Status: AC
  Administered 2022-02-25: 10 meq via INTRAVENOUS
  Filled 2022-02-25: qty 100

## 2022-02-25 MED ORDER — AMLODIPINE BESYLATE 5 MG PO TABS
5.0000 mg | ORAL_TABLET | Freq: Every day | ORAL | Status: DC
  Administered 2022-02-25 – 2022-03-02 (×6): 5 mg via ORAL
  Filled 2022-02-25 (×6): qty 1

## 2022-02-25 MED ORDER — POTASSIUM CHLORIDE CRYS ER 20 MEQ PO TBCR: 40.0000 meq | EXTENDED_RELEASE_TABLET | Freq: Once | ORAL | Status: DC

## 2022-02-25 MED ORDER — ONDANSETRON HCL 4 MG/2ML IJ SOLN: 4.0000 mg | Freq: Four times a day (QID) | INTRAMUSCULAR | Status: DC | PRN

## 2022-02-25 MED ORDER — BACID PO TABS
2.0000 | ORAL_TABLET | Freq: Three times a day (TID) | ORAL | Status: DC
  Filled 2022-02-25 (×2): qty 2

## 2022-02-25 MED ORDER — SODIUM CHLORIDE 0.9 % IV SOLN
2.0000 g | INTRAVENOUS | Status: DC
  Administered 2022-02-26: 2 g via INTRAVENOUS
  Filled 2022-02-25: qty 20

## 2022-02-25 MED ORDER — HYDROCHLOROTHIAZIDE 25 MG PO TABS
25.0000 mg | ORAL_TABLET | Freq: Once | ORAL | Status: AC
  Administered 2022-02-25: 25 mg via ORAL
  Filled 2022-02-25: qty 1

## 2022-02-25 MED ORDER — ACETAMINOPHEN 650 MG RE SUPP: 650.0000 mg | Freq: Four times a day (QID) | RECTAL | Status: DC | PRN

## 2022-02-25 MED ORDER — LACTATED RINGERS IV BOLUS
500.0000 mL | Freq: Once | INTRAVENOUS | Status: AC
  Administered 2022-02-25: 500 mL via INTRAVENOUS

## 2022-02-25 MED ORDER — SODIUM CHLORIDE 0.9 % IV SOLN: INTRAVENOUS | Status: DC

## 2022-02-25 MED ORDER — TAMSULOSIN HCL 0.4 MG PO CAPS
0.4000 mg | ORAL_CAPSULE | Freq: Every day | ORAL | Status: DC
  Administered 2022-02-25 – 2022-03-02 (×6): 0.4 mg via ORAL
  Filled 2022-02-25 (×6): qty 1

## 2022-02-25 MED ORDER — POTASSIUM CHLORIDE CRYS ER 20 MEQ PO TBCR
40.0000 meq | EXTENDED_RELEASE_TABLET | Freq: Once | ORAL | Status: AC
Start: 2022-02-25 — End: 2022-02-25
  Administered 2022-02-25: 40 meq via ORAL
  Filled 2022-02-25: qty 2

## 2022-02-25 MED ORDER — ONDANSETRON HCL 4 MG/2ML IJ SOLN
4.0000 mg | Freq: Once | INTRAMUSCULAR | Status: AC
  Administered 2022-02-25: 4 mg via INTRAVENOUS
  Filled 2022-02-25: qty 2

## 2022-02-25 MED ORDER — RISAQUAD PO CAPS
2.0000 | ORAL_CAPSULE | Freq: Three times a day (TID) | ORAL | Status: DC
  Administered 2022-02-25 – 2022-03-02 (×15): 2 via ORAL
  Filled 2022-02-25 (×15): qty 2

## 2022-02-25 MED ORDER — AMLODIPINE BESYLATE 5 MG PO TABS
5.0000 mg | ORAL_TABLET | Freq: Once | ORAL | Status: AC
  Administered 2022-02-25: 5 mg via ORAL
  Filled 2022-02-25: qty 1

## 2022-02-25 MED ORDER — LABETALOL HCL 5 MG/ML IV SOLN: 10.0000 mg | INTRAVENOUS | Status: DC | PRN

## 2022-02-25 NOTE — H&P (Signed)
History and Physical    Calvin [name] KDX:[id number] DOB: 29-Nov-1945 DOA: 02/24/2022  PCP: Laurey Morale, MD (Confirm with patient/family/NH records and if not entered, this has to be entered at [facility] point of entry) Patient coming from: Home  I have personally briefly reviewed patient's old medical records in [place]  Chief Complaint: Flank pain, diarrhea  HPI: Calvin [name] is a 76 y.o. male with medical history significant of BPH, HTN, anxiety/depression, presented with persistent bilateral flank pain and diarrhea.  Patient reports developed a watery diarrhea 3-4 times a day about 4 to 5 days ago, with cramping-like periumbilical pain, no fever or chills.  He called his PCP who suspected patient had a virus enteritis and recommended him to keep hydrated.  3 days ago, patient started to develop bilateral flank pain and left> right, radiating to the groin area, episode of chills no fever.  Then he started to have vomiting yesterday afternoon and decided to come to the hospital.  He denied any dysuria, he has chronic weak urine stream and urinary frequency likely from uncontrolled BPH.  ED Course: Afebrile, borderline tachycardia, blood pressure on the high side SBP> 150.  CT abdomen showed enlarged irregular thickening of prostate causing mass effect on bladder base and possible extension into the bladder wall causing severe bladder distention with bilateral hydronephrosis moderate severe right> left with periureteral inflammation and fluid stranding, highly suspicious for malignant process.  Urology consulted, Foley placed in and urology recommended antibiotics and urology follow-up  Review of Systems: As per HPI otherwise 14 point review of systems negative.   Past Medical History:  Diagnosis Date   Benign prostatic hypertrophy    ED (erectile dysfunction)    Headache(784.0)    Hypertension    Hypogonadism male    Nephrolithiasis    hx of had lithotripsy in 1-10.    Past  Surgical History:  Procedure Laterality Date   colonoscopy  10/05/2020   per Dr. Fuller Plan, adenomatous polyps, repeat in 3 yrs   [place]  03/12/2012   Procedure: APPENDECTOMY LAPAROSCOPIC;  Surgeon: Harl Bowie, MD;  Location: [place];  Service: General;  Laterality: N/A;   STAPLE HEMORRHOIDECTOMY  04/14/2010   per Dr. Johney Maine    TONSILLECTOMY       reports that he has never smoked. He has never used smokeless tobacco. He reports current alcohol use of about 14.0 standard drinks of alcohol per week. He reports that he does not use drugs.  No Known Allergies  Family History  Problem Relation Age of Onset   Heart Problems Mother        Caused by a Virus when she was a teen   Stroke Father    Prostate cancer Father    Hypertension Other    Stroke Other    Heart disease Other        rheumatic    Coronary artery disease Other    Healthy Child    Colon cancer Neg Hx    Colon polyps Neg Hx    Esophageal cancer Neg Hx    Rectal cancer Neg Hx    Stomach cancer Neg Hx      Prior to Admission medications   Medication Sig Start Date End Date Taking? Authorizing Provider  amLODipine (NORVASC) 5 MG tablet Take 1 tablet (5 mg total) by mouth daily. 04/25/21   Laurey Morale, MD  cholecalciferol (VITAMIN D3) 25 MCG (1000 UNIT) tablet  Take 1,000 Units by mouth daily.    [provider]  cyanocobalamin (,VITAMIN B-12,) 1000 MCG/ML injection Inject 1 mL (1,000 mcg total) into the muscle once a week. 12/26/21   Laurey Morale, MD  cyclobenzaprine (FLEXERIL) 10 MG tablet Take 1 tablet (10 mg total) by mouth 3 (three) times daily as needed for muscle spasms. 02/06/22   Laurey Morale, MD  diazepam (VALIUM) 5 MG tablet Take 1 or 2 tablets about 30 minutes before procedures such as MRI scans 07/28/21   Laurey Morale, MD  finasteride (PROSCAR) 5 MG tablet Take 1 tablet (5 mg total) by mouth daily. 04/25/21   Laurey Morale, MD  hydrochlorothiazide  (HYDRODIURIL) 25 MG tablet TAKE 1 TABLET BY MOUTH EVERY DAY 07/10/21   Laurey Morale, MD  losartan (COZAAR) 100 MG tablet Take 1 tablet (100 mg total) by mouth daily. 04/25/21   Laurey Morale, MD  potassium chloride (KLOR-CON 10) 10 MEQ tablet Take 1 tablet (10 mEq total) by mouth daily. 07/20/21   Laurey Morale, MD  potassium chloride (KLOR-CON M) 10 MEQ tablet Take 1 tablet (10 mEq total) by mouth 2 (two) times daily. 12/26/21   Laurey Morale, MD  sertraline (ZOLOFT) 100 MG tablet TAKE 1 TABLET BY MOUTH EVERY DAY 02/05/22   Laurey Morale, MD  SYRINGE-NEEDLE, DISP, 3 ML (BD ECLIPSE SYRINGE/NEEDLE) 25G X 5/8" 3 ML MISC Use as directed once a week 12/26/21   Laurey Morale, MD  tamsulosin (FLOMAX) 0.4 MG CAPS capsule Take 1 capsule (0.4 mg total) by mouth daily. 04/25/21   Laurey Morale, MD  vitamin C (ASCORBIC ACID) 500 MG tablet Take 500 mg by mouth daily.    [provider]  zinc gluconate 50 MG tablet Take 50 mg by mouth daily.    [provider]    Physical Exam: Vitals:   02/24/22 2222 02/25/22 0200 02/25/22 0630 02/25/22 0907  BP: (!) 166/117 (!) 153/114 (!) 155/118 (!) 162/118  Pulse: (!) 112 (!) 113 (!) 57 (!) 108  Resp: '18 19 18 18  '$ Temp: 98.5 F (36.9 C)   97.7 F (36.5 C)  TempSrc:    Oral  SpO2: 96% 95% 93% 95%    Constitutional: NAD, calm, comfortable Vitals:   02/24/22 2222 02/25/22 0200 02/25/22 0630 02/25/22 0907  BP: (!) 166/117 (!) 153/114 (!) 155/118 (!) 162/118  Pulse: (!) 112 (!) 113 (!) 57 (!) 108  Resp: '18 19 18 18  '$ Temp: 98.5 F (36.9 C)   97.7 F (36.5 C)  TempSrc:    Oral  SpO2: 96% 95% 93% 95%   Eyes: PERRL, lids and conjunctivae normal ENMT: Mucous membranes are dry. Posterior pharynx clear of any exudate or lesions.Normal dentition.  Neck: normal, supple, no masses, no thyromegaly Respiratory: clear to auscultation bilaterally, no wheezing, no crackles. Normal respiratory effort. No accessory muscle use.  Cardiovascular: Regular  rate and rhythm, no murmurs / rubs / gallops. No extremity edema. 2+ pedal pulses. No carotid bruits.  Abdomen: Bilateral CVA tenderness, no masses palpated. No hepatosplenomegaly. Bowel sounds positive.  Musculoskeletal: no clubbing / cyanosis. No joint deformity upper and lower extremities. Good ROM, no contractures. Normal muscle tone.  Skin: no rashes, lesions, ulcers. No induration Neurologic: CN 2-12 grossly intact. Sensation intact, DTR normal. Strength 5/5 in all 4.  Psychiatric: Normal judgment and insight. Alert and oriented x 3. Normal mood.     Labs on Admission: I have personally reviewed  following labs and imaging studies  CBC: Recent Labs  Lab 02/21/22 1709 02/24/22 2018  WBC 10.1 13.6*  NEUTROABS 7.4 11.0*  HGB 15.3 16.0  HCT 44.1 43.1  MCV 98.9 94.7  PLT 258.0 761   Basic Metabolic Panel: Recent Labs  Lab 02/21/22 1709 02/24/22 2018 02/25/22 1132  NA 137 136 135  K 3.5 3.0* 3.1*  CL 101 99 101  CO2 20 17* 21*  GLUCOSE 99 117* 118*  BUN 24* 32* 38*  CREATININE 1.03 1.97* 2.27*  CALCIUM 9.8 9.7 8.9  MG 1.5  --   --    GFR: Estimated Creatinine Clearance: 35 mL/min (A) (by C-G formula based on SCr of 2.27 mg/dL (H)). Liver Function Tests: Recent Labs  Lab 02/21/22 1709 02/24/22 2018  AST 18 21  ALT 24 24  ALKPHOS 143* 127*  BILITOT 0.7 1.9*  PROT 6.9 7.4  ALBUMIN 4.1 3.8   Recent Labs  Lab 02/24/22 2018  LIPASE 26   Recent Labs  Lab 02/21/22 1709  AMMONIA 30   Coagulation Profile: No results for input(s): "INR", "PROTIME" in the last 168 hours. Cardiac Enzymes: No results for input(s): "CKTOTAL", "CKMB", "CKMBINDEX", "TROPONINI" in the last 168 hours. BNP (last 3 results) No results for input(s): "PROBNP" in the last 8760 hours. HbA1C: No results for input(s): "HGBA1C" in the last 72 hours. CBG: No results for input(s): "GLUCAP" in the last 168 hours. Lipid Profile: No results for input(s): "CHOL", "HDL", "LDLCALC", "TRIG",  "CHOLHDL", "LDLDIRECT" in the last 72 hours. Thyroid Function Tests: No results for input(s): "TSH", "T4TOTAL", "FREET4", "T3FREE", "THYROIDAB" in the last 72 hours. Anemia Panel: No results for input(s): "VITAMINB12", "FOLATE", "FERRITIN", "TIBC", "IRON", "RETICCTPCT" in the last 72 hours. Urine analysis:    Component Value Date/Time   COLORURINE YELLOW 02/25/2022 1132   APPEARANCEUR CLOUDY (A) 02/25/2022 1132   LABSPEC 1.014 02/25/2022 1132   PHURINE 5.0 02/25/2022 1132   GLUCOSEU NEGATIVE 02/25/2022 1132   HGBUR LARGE (A) 02/25/2022 1132   HGBUR negative 12/14/2009 0823   BILIRUBINUR NEGATIVE 02/25/2022 1132   BILIRUBINUR neg 09/14/2020 1459   KETONESUR 5 (A) 02/25/2022 1132   PROTEINUR 30 (A) 02/25/2022 1132   UROBILINOGEN 1.0 09/14/2020 1459   UROBILINOGEN 1.0 03/12/2012 1250   NITRITE NEGATIVE 02/25/2022 1132   LEUKOCYTESUR LARGE (A) 02/25/2022 1132    Radiological Exams on Admission: CT ABDOMEN PELVIS W CONTRAST  Result Date: 02/25/2022 CLINICAL DATA:  Diarrhea. Abdominal mass/hernia. Anion gap metabolic acidosis. Diarrhea with nausea onset last week. Emesis today and mid upper abdominal pain. EXAM: CT ABDOMEN AND PELVIS WITH CONTRAST TECHNIQUE: Multidetector CT imaging of the abdomen and pelvis was performed using the standard protocol following bolus administration of intravenous contrast. RADIATION DOSE REDUCTION: This exam was performed according to the departmental dose-optimization program which includes automated exposure control, adjustment of the mA and/or kV according to patient size and/or use of iterative reconstruction technique. CONTRAST:  6m OMNIPAQUE IOHEXOL 350 MG/ML SOLN COMPARISON:  CT abdomen dated 01/19/2016. FINDINGS: Lower chest: Mild scarring/atelectasis at the lung bases. No acute findings. Hepatobiliary: Several hepatic cysts. No follow-up imaging is recommended for this benign finding. No acute or suspicious findings within the liver. Gallbladder is  unremarkable. No bile duct dilatation is seen. Pancreas: Unremarkable. No pancreatic ductal dilatation or surrounding inflammatory changes. Spleen: Normal in size without focal abnormality. Adrenals/Urinary Tract: Bladder is markedly distended. Prostate gland is enlarged, with the irregular thickening anteriorly, causing mass effect on the bladder base and possible extension  into the bladder wall. Associated bilateral hydronephrosis, moderate to severe in degree, RIGHT greater than LEFT. Associated perinephric and periureteral inflammation/fluid stranding. No renal or ureteral calculi. LEFT renal cysts. No follow-up imaging is recommended for this benign finding. Stomach/Bowel: No dilated large or small bowel loops. No evidence of bowel wall inflammation. Appendix is not seen but there are no inflammatory changes about the cecum to suggest acute appendicitis. Stomach is unremarkable, partially decompressed. Vascular/Lymphatic: Aortic atherosclerosis. No abdominal aortic aneurysm. No acute-appearing vascular abnormality. No enlarged lymph nodes are seen in the abdomen or pelvis. Reproductive: Prostate gland is heterogeneously enlarged, as above. Can not exclude prostate extension to the overlying posterior-inferior bladder wall. Other: No abscess collection is seen.  No free intraperitoneal air. Musculoskeletal: No acute or suspicious osseous abnormality. Degenerative spondylosis scattered throughout the thoracolumbar spine, mild to moderate in degree. IMPRESSION: 1. Bladder is markedly distended. 2. Prostate gland is heterogeneously enlarged, with irregular thickening anteriorly, causing mass effect on the bladder base and possible extension into the bladder wall, and causing the severe bladder distention. 3. Associated bilateral hydronephrosis, moderate to severe in degree, RIGHT greater than LEFT, with associated perinephric and periureteral inflammation/fluid stranding. Findings are highly suspicious for a  neoplastic process. Again, can not exclude prostate neoplasm with extension to the inferior bladder wall. Recommend Urology consultation for further workup considerations. Aortic Atherosclerosis (ICD10-I70.0). Electronically Signed   By: Franki Cabot M.D.   On: 02/25/2022 10:08    EKG: Independently reviewed.  Sinus tachycardia, no acute ST changes.  Assessment/Plan Principal Problem:   Pyelonephritis Active Problems:   Acute pyelonephritis   AKI (acute kidney injury) ([place])  (please populate well all problems here in Problem List. (For example, if patient is on BP meds at home and you resume or decide to hold them, it is a problem that needs to be her. Same for CAD, COPD, HLD and so on)  AKI secondary to obstructive uropathy -CT abdomen showed suspicious invasive prostate malignancy into the bladder base and the wall with mass effect. -Foley placed in and drained about 800 mL urine so far. -Cardiology recommended maintain Foley and outpatient follow-up with urology for further prostate cancer work-up. -Received 2.5 L IV bolus in the ED, kidney function still trending up, continue high rate of maintenance IV fluid  Bilateral acute pyelonephritis, with microscopic hematuria and pyuria -Secondary to obstructive uropathy at the bladder level -Ceftriaxone for now -Urine culture sent  Acute diarrhea -Likely viral enteritis, appears to be self-limiting, monitor without additional antibiotics.  Ordered probiotics and Imodium  Hypokalemia -P.o. and IV replacement, recheck level tomorrow.  New onset of prostate malignancy -Probably will need to complete antibiotic treatment before consider cystoscopy and biopsy, as per urology's recommendation, follow-up with urology as outpatient.  DVT prophylaxis: Heparin subcu Code Status: full code  nication: Daughter over the phone Disposition Plan: Patient sick with AKI, pyonephritis requiring IV antibiotics and IV fluids, expect more than 2 midnight  hospital stay Consults called: Urology, reconsult if necessary. Admission status: MedSurg admission   Lequita Halt MD [facility] Pager [phone]  02/25/2022, 1:18 PM

## 2022-02-25 NOTE — ED Notes (Signed)
Pt seen walking in lobby with stable gait

## 2022-02-25 NOTE — ED Provider Notes (Signed)
[facility] EMERGENCY DEPARTMENT Provider Note   CSN: [id number] Arrival date & time: 02/24/22  1842     History {Add pertinent medical, surgical, social history, OB history to HPI:1} Chief Complaint  Patient presents with   Emesis/Diarrhea    Calvin [name] is a 76 y.o. male.  HPI    76 year old male with a history of hypertension, nephrolithiasis, BPH, presents with concern for diarrhea, nausea and vomiting.  Reports that he has been having diarrhea for the last 3 days, reporting almost constant diarrhea, too many episodes to count.  Denies black or bloody stools.  Has had 3 episodes of vomiting starting last night.  Has not been able to tolerate anything since last night.  Denies fevers, urinary symptoms.  Describes generalized weakness and fatigue, but denies lightheadedness or syncope.  No recent antibiotic use or travel.  No known sick contacts.  Denies ongoing abdominal pain.  Had lab work sent by primary care doctor on the sixth   Past Medical History:  Diagnosis Date   Benign prostatic hypertrophy    ED (erectile dysfunction)    Headache(784.0)    Hypertension    Hypogonadism male    Nephrolithiasis    hx of had lithotripsy in 1-10.     Home Medications Prior to Admission medications   Medication Sig Start Date End Date Taking? Authorizing Provider  amLODipine (NORVASC) 5 MG tablet Take 1 tablet (5 mg total) by mouth daily. 04/25/21   Laurey Morale, MD  cholecalciferol (VITAMIN D3) 25 MCG (1000 UNIT) tablet Take 1,000 Units by mouth daily.    [provider]  cyanocobalamin (,VITAMIN B-12,) 1000 MCG/ML injection Inject 1 mL (1,000 mcg total) into the muscle once a week. 12/26/21   Laurey Morale, MD  cyclobenzaprine (FLEXERIL) 10 MG tablet Take 1 tablet (10 mg total) by mouth 3 (three) times daily as needed for muscle spasms. 02/06/22   Laurey Morale, MD  diazepam (VALIUM) 5 MG tablet Take 1 or 2 tablets about 30 minutes before procedures  such as MRI scans 07/28/21   Laurey Morale, MD  finasteride (PROSCAR) 5 MG tablet Take 1 tablet (5 mg total) by mouth daily. 04/25/21   Laurey Morale, MD  hydrochlorothiazide (HYDRODIURIL) 25 MG tablet TAKE 1 TABLET BY MOUTH EVERY DAY 07/10/21   Laurey Morale, MD  losartan (COZAAR) 100 MG tablet Take 1 tablet (100 mg total) by mouth daily. 04/25/21   Laurey Morale, MD  potassium chloride (KLOR-CON 10) 10 MEQ tablet Take 1 tablet (10 mEq total) by mouth daily. 07/20/21   Laurey Morale, MD  potassium chloride (KLOR-CON M) 10 MEQ tablet Take 1 tablet (10 mEq total) by mouth 2 (two) times daily. 12/26/21   Laurey Morale, MD  sertraline (ZOLOFT) 100 MG tablet TAKE 1 TABLET BY MOUTH EVERY DAY 02/05/22   Laurey Morale, MD  SYRINGE-NEEDLE, DISP, 3 ML (BD ECLIPSE SYRINGE/NEEDLE) 25G X 5/8" 3 ML MISC Use as directed once a week 12/26/21   Laurey Morale, MD  tamsulosin (FLOMAX) 0.4 MG CAPS capsule Take 1 capsule (0.4 mg total) by mouth daily. 04/25/21   Laurey Morale, MD  vitamin C (ASCORBIC ACID) 500 MG tablet Take 500 mg by mouth daily.    [provider]  zinc gluconate 50 MG tablet Take 50 mg by mouth daily.    [provider]      Allergies    Patient has no known allergies.  Review of Systems   Review of Systems  Physical Exam Updated Vital Signs BP (!) 162/118   Pulse (!) 108   Temp 97.7 F (36.5 C) (Oral)   Resp 18   SpO2 95%  Physical Exam  ED Results / Procedures / Treatments   Labs (all labs ordered are listed, but only abnormal results are displayed) Labs Reviewed  CBC WITH DIFFERENTIAL/PLATELET - Abnormal; Notable for the following components:      Result Value   WBC 13.6 (*)    MCH 35.2 (*)    MCHC 37.1 (*)    Neutro Abs 11.0 (*)    Monocytes Absolute 1.7 (*)    All other components within normal limits  COMPREHENSIVE METABOLIC PANEL - Abnormal; Notable for the following components:   Potassium 3.0 (*)    CO2 17 (*)    Glucose, Bld 117 (*)    BUN 32  (*)    Creatinine, Ser 1.97 (*)    Alkaline Phosphatase 127 (*)    Total Bilirubin 1.9 (*)    GFR, Estimated 35 (*)    Anion gap 20 (*)    All other components within normal limits  LIPASE, BLOOD  URINALYSIS, ROUTINE W REFLEX MICROSCOPIC  MAGNESIUM  LACTIC ACID, PLASMA  LACTIC ACID, PLASMA    EKG None  Radiology No results found.  Procedures Procedures  {Document cardiac monitor, telemetry assessment procedure when appropriate:1}  Medications Ordered in ED Medications  potassium chloride 10 mEq in 100 mL IVPB (has no administration in time range)  lactated ringers bolus 1,000 mL (1,000 mLs Intravenous New Bag/Given 02/25/22 0903)    ED Course/ Medical Decision Making/ A&P                           Medical Decision Making Amount and/or Complexity of Data Reviewed Labs: ordered. Radiology: ordered.  Risk Prescription drug management.     76 year old male with a history of hypertension, nephrolithiasis, BPH, gait instability for which she is being worked up by neurology, presents with concern for diarrhea, nausea and vomiting.  Labs completed and personally evaluated interpreted by me show a mild leukocytosis of 13,000, increased from days ago, mild hypokalemia with a potassium of 3, bicarb of 17, mild acute kidney injury with a creatinine of 1.97 from previous in July of 0.97, and an anion gap metabolic acidosis.  Lipase is within normal limits and have low suspicion for pancreatitis.  Have low suspicion for cholecystitis or choledocholithiasis.    CT abdomen pelvis was completed which showed***     {Document critical care time when appropriate:1} {Document review of labs and clinical decision tools ie heart score, Chads2Vasc2 etc:1}  {Document your independent review of radiology images, and any outside records:1} {Document your discussion with family members, caretakers, and with consultants:1} {Document social determinants of health affecting pt's  care:1} {Document your decision making why or why not admission, treatments were needed:1} Final Clinical Impression(s) / ED Diagnoses Final diagnoses:  None    Rx / DC Orders ED Discharge Orders     None

## 2022-02-26 ENCOUNTER — Ambulatory Visit: Payer: Medicare HMO | Admitting: Physical Therapy

## 2022-02-26 DIAGNOSIS — N12 Tubulo-interstitial nephritis, not specified as acute or chronic: Secondary | ICD-10-CM | POA: Diagnosis not present

## 2022-02-26 LAB — BASIC METABOLIC PANEL
Anion gap: 8 (ref 5–15)
Anion gap: 7 (ref 5–15)
BUN: 22 mg/dL (ref 8–23)
BUN: 21 mg/dL (ref 8–23)
CO2: 24 mmol/L (ref 22–32)
CO2: 25 mmol/L (ref 22–32)
Calcium: 8.4 mg/dL — ABNORMAL LOW (ref 8.9–10.3)
Calcium: 8.2 mg/dL — ABNORMAL LOW (ref 8.9–10.3)
Chloride: 107 mmol/L (ref 98–111)
Chloride: 105 mmol/L (ref 98–111)
Creatinine, Ser: 1.17 mg/dL (ref 0.61–1.24)
Creatinine, Ser: 1.13 mg/dL (ref 0.61–1.24)
GFR, Estimated: >60 mL/min (ref >60)
GFR, Estimated: >60 mL/min (ref >60)
Glucose, Bld: 106 mg/dL — ABNORMAL HIGH (ref 70–99)
Glucose, Bld: 98 mg/dL (ref 70–99)
Potassium: 2.7 mmol/L — CL (ref 3.5–5.1)
Potassium: 3 mmol/L — ABNORMAL LOW (ref 3.5–5.1)
Sodium: 139 mmol/L (ref 135–145)
Sodium: 137 mmol/L (ref 135–145)

## 2022-02-26 LAB — CBC
HCT: 38 % — ABNORMAL LOW (ref 39.0–52.0)
Hemoglobin: 13.6 g/dL (ref 13.0–17.0)
MCH: 34.8 pg — ABNORMAL HIGH (ref 26.0–34.0)
MCHC: 35.8 g/dL (ref 30.0–36.0)
MCV: 97.2 fL (ref 80.0–100.0)
Platelets: 220 10*3/uL (ref 150–400)
RBC: 3.91 MIL/uL — ABNORMAL LOW (ref 4.22–5.81)
RDW: 11.9 % (ref 11.5–15.5)
WBC: 8.2 10*3/uL (ref 4.0–10.5)
nRBC: 0 % (ref 0.0–0.2)

## 2022-02-26 MED ORDER — POTASSIUM CHLORIDE CRYS ER 20 MEQ PO TBCR
40.0000 meq | EXTENDED_RELEASE_TABLET | ORAL | Status: DC
  Administered 2022-02-26: 40 meq via ORAL
  Filled 2022-02-26: qty 2

## 2022-02-26 MED ORDER — LACTATED RINGERS IV SOLN: INTRAVENOUS | Status: DC

## 2022-02-26 MED ORDER — POTASSIUM CHLORIDE CRYS ER 20 MEQ PO TBCR
40.0000 meq | EXTENDED_RELEASE_TABLET | Freq: Once | ORAL | Status: AC
  Administered 2022-02-26: 40 meq via ORAL
  Filled 2022-02-26: qty 2

## 2022-02-26 NOTE — Hospital Course (Addendum)
76 year old man PMH including BPH presented with vomiting and diarrhea, bilateral flank pain.  CT showed enlarged prostate irregular causing mass effect on bladder with severe bladder distention and bilateral hydronephrosis with periureteral inflammation and fluid stranding with concern for malignant process.  Foley catheter placed in the emergency department with return of almost 5 L.  Admitted for AKI secondary to obstructive uropathy, bilateral acute pyelonephritis, viral enteritis.  Condition gradually improved with resolution of AKI and infection.  Seen by physical and Occupational Therapy with recommendation for SNF short-term.

## 2022-02-26 NOTE — Progress Notes (Signed)
  Progress Note Patient: Calvin [name] MOQ:[id number] DOB: 08/17/1945 DOA: 02/24/2022  DOS: the patient was seen and examined on 02/26/2022  Brief hospital course: PMH of BPH, HTN, anxiety, depression presented to hospital with complaints of flank pain and diarrhea found to have hypokalemia, bilateral hydronephrosis and suspicious malignant process in the prostate. Foley catheter inserted in the ER. Currently receiving IV antibiotics.  Monitor cultures.  Assessment and Plan: Acute kidney injury secondary to obstructive uropathy. Bilateral hydronephrosis  Presents with complaints of flank pain. CT abdomen shows bilateral hydronephrosis likely from bladder outlet obstruction from prostatic hypertrophy. Baseline serum creatinine normal. On presentation serum creatinine 1.97. Currently improving to normal. Patient generated 10 L of urine in last 24 hours. We will monitor urine output and hospital. Foley catheter inserted in the ER. Urology was consulted by EDP.  Outpatient follow-up recommended based on the documentation.  Prostate enlargement with possible malignancy  CT [place] shows heterogeneously enlarged prostate with irregular thickening mass effect on bladder base and extension into bladder wall. We will check PSA. 0.93 on 11/22.  Bilateral perinephric stranding. Concern for UTI/pyelonephritis Has leukocytosis but no fever Patient was started on empiric IV ceftriaxone 2 g in the ER. Currently no cultures performed. We will continue empiric therapy for total 5 days.  Transition to p.o. likely tomorrow.  Severe hypokalemia. Potassium level 2.7. Continue to replace aggressively. We will recheck tomorrow.  Aortic atherosclerosis Seen incidentally on the CT. Monitor.  Ongoing diarrhea. Patient reports to have diarrhea at home. Suspected to have viral enteritis. For last 4 days patient does not have a BM. Given that the patient had further loose BM here in the hospital check C.  difficile.  Subjective: Currently no nausea no vomiting no fever no chills.  No pain.    Physical Exam: Vitals:   02/26/22 0000 02/26/22 0403 02/26/22 0821 02/26/22 1722  BP: 104/60 128/79 (!) 138/91 (!) 141/93  Pulse: 87 83 90 85  Resp: '16  16 16  '$ Temp: 98.6 F (37 C) 98.6 F (37 C) 97.8 F (36.6 C) 97.6 F (36.4 C)  TempSrc: Oral Oral Oral Oral  SpO2: 95% 93% 95% 96%  Weight:      Height:       General: Appear in moderate distress; no visible Abnormal Neck Mass Or lumps, Conjunctiva normal, oral mucosa dry Cardiovascular: S1 and S2 Present, no Murmur, Respiratory: good respiratory effort, Bilateral Air entry present and CTA, no Crackles, no wheezes Abdomen: Bowel Sound present, diffuse tenderness, mild Extremities: no Pedal edema Neurology: alert and oriented to time, place, and person  Gait not checked due to patient safety concerns   Data Reviewed: I have Reviewed nursing notes, Vitals, and Lab results since pt's last encounter. Pertinent lab results CBC and BMP I have ordered test including CBC and BMP    Family Communication: No one at bedside  Disposition: Status is: Inpatient Remains inpatient appropriate because: Requiring IV antibiotics and aggressive correction of potassium levels.  Author: Berle Mull, MD 02/26/2022 6:32 PM  Please look on [email or web address] to find out who is on call.

## 2022-02-26 NOTE — Progress Notes (Addendum)
Overnight it was brought to the  RNs  attention by NT that the patient's wallet may be missing. The Charge nurse and [facility] was informed and Ed was asked to keep a look for it.RN and charge nurse went to the beside to make an enquiry. Patient verbalize that he believe the last place he saw it was on the unit prior to being admitted to 2W.  Addendum: Calvin [name] was found in patient's bed by RN at 0552am. Charge nurse was notified.

## 2022-02-27 DIAGNOSIS — N12 Tubulo-interstitial nephritis, not specified as acute or chronic: Secondary | ICD-10-CM | POA: Diagnosis not present

## 2022-02-27 LAB — CBC
HCT: 34.3 % — ABNORMAL LOW (ref 39.0–52.0)
Hemoglobin: 12.2 g/dL — ABNORMAL LOW (ref 13.0–17.0)
MCH: 34.3 pg — ABNORMAL HIGH (ref 26.0–34.0)
MCHC: 35.6 g/dL (ref 30.0–36.0)
MCV: 96.3 fL (ref 80.0–100.0)
Platelets: 217 10*3/uL (ref 150–400)
RBC: 3.56 MIL/uL — ABNORMAL LOW (ref 4.22–5.81)
RDW: 11.7 % (ref 11.5–15.5)
WBC: 6.9 10*3/uL (ref 4.0–10.5)
nRBC: 0 % (ref 0.0–0.2)

## 2022-02-27 LAB — BASIC METABOLIC PANEL
Anion gap: 7 (ref 5–15)
Anion gap: 7 (ref 5–15)
BUN: 19 mg/dL (ref 8–23)
BUN: 14 mg/dL (ref 8–23)
CO2: 24 mmol/L (ref 22–32)
CO2: 25 mmol/L (ref 22–32)
Calcium: 8.2 mg/dL — ABNORMAL LOW (ref 8.9–10.3)
Calcium: 8.3 mg/dL — ABNORMAL LOW (ref 8.9–10.3)
Chloride: 105 mmol/L (ref 98–111)
Chloride: 103 mmol/L (ref 98–111)
Creatinine, Ser: 1 mg/dL (ref 0.61–1.24)
Creatinine, Ser: 1.05 mg/dL (ref 0.61–1.24)
GFR, Estimated: >60 mL/min (ref >60)
GFR, Estimated: >60 mL/min (ref >60)
Glucose, Bld: 98 mg/dL (ref 70–99)
Glucose, Bld: 92 mg/dL (ref 70–99)
Potassium: 3 mmol/L — ABNORMAL LOW (ref 3.5–5.1)
Potassium: 3.9 mmol/L (ref 3.5–5.1)
Sodium: 136 mmol/L (ref 135–145)
Sodium: 135 mmol/L (ref 135–145)

## 2022-02-27 LAB — C DIFFICILE QUICK SCREEN W PCR REFLEX
C Diff antigen: NEGATIVE
C Diff interpretation: NOT DETECTED
C Diff toxin: NEGATIVE

## 2022-02-27 LAB — MAGNESIUM
Magnesium: 1.2 mg/dL — ABNORMAL LOW (ref 1.7–2.4)
Magnesium: 1.8 mg/dL (ref 1.7–2.4)

## 2022-02-27 MED ORDER — POTASSIUM CHLORIDE CRYS ER 20 MEQ PO TBCR
40.0000 meq | EXTENDED_RELEASE_TABLET | ORAL | Status: AC
  Administered 2022-02-27 (×3): 40 meq via ORAL
  Filled 2022-02-27 (×3): qty 2

## 2022-02-27 MED ORDER — CEFDINIR 300 MG PO CAPS
300.0000 mg | ORAL_CAPSULE | Freq: Two times a day (BID) | ORAL | Status: DC
  Administered 2022-02-27 – 2022-03-02 (×7): 300 mg via ORAL
  Filled 2022-02-27 (×10): qty 1

## 2022-02-27 MED ORDER — MAGNESIUM SULFATE 4 GM/100ML IV SOLN
4.0000 g | Freq: Once | INTRAVENOUS | Status: AC
  Administered 2022-02-27: 4 g via INTRAVENOUS
  Filled 2022-02-27: qty 100

## 2022-02-27 NOTE — Evaluation (Signed)
Occupational Therapy Evaluation Patient Details Name: Calvin [name] [name]: [id number] DOB: [birth year] Today's Date: 02/27/2022   History of Present Illness Patient is 76 y.o. male presented to hospital with complaints of flank pain and diarrhea found to have hypokalemia, bilateral hydronephrosis and suspicious malignant process in the prostate. PMH of BPH, HTN, anxiety, depression.   Clinical Impression   PTA pt lives alone @ modified independent level using a straight cane. Pt reports increased weakness over the last 5-6 weeks with increased falls (5 alone in the week before admission).  Pt currently requires min A for mobility and ADL tasks due to deficits listed below. Feel pt would benefit from rehab at SNF to maximize functional level of independence. Acute OT to follow.      Recommendations for follow up therapy are one component of a multi-disciplinary discharge planning process, led by the attending physician.  Recommendations may be updated based on patient status, additional functional criteria and insurance authorization.   Follow Up Recommendations  Skilled nursing-short term rehab (<3 hours/day)    Assistance Recommended at Discharge Intermittent Supervision/Assistance  Patient can return home with the following A little help with walking and/or transfers;A little help with bathing/dressing/bathroom;Assistance with cooking/housework;Help with stairs or ramp for entrance    Functional Status Assessment  Patient has had a recent decline in their functional status and demonstrates the ability to make significant improvements in function in a reasonable and predictable amount of time.  Equipment Recommendations  BSC/3in1    Recommendations for Other Services       Precautions / Restrictions Precautions Precautions: Fall Precaution Comments: 1 every week for a couple months, in the last week pt reports he fell about 5 times Restrictions Weight Bearing Restrictions: No       Mobility Bed Mobility Overal bed mobility:  (OOB in chair)                  Transfers Overall transfer level: Needs assistance Equipment used: Rolling walker (2 wheels) Transfers: Sit to/from Stand Sit to Stand: Min assist           General transfer comment: min assist to rise from low EOB height, pt required cues for hand placement, pt verbalized awareness of proper foot position to power up      Balance Overall balance assessment: Needs assistance, History of Falls Sitting-balance support: Feet supported Sitting balance-Leahy Scale: Good     Standing balance support: Reliant on assistive device for balance, Bilateral upper extremity supported, During functional activity Standing balance-Leahy Scale: Poor                             ADL either performed or assessed with clinical judgement   ADL Overall ADL's : Needs assistance/impaired     Grooming: Set up   Upper Body Bathing: Set up   Lower Body Bathing: Min guard   Upper Body Dressing : Set up   Lower Body Dressing: Min guard;Sit to/from stand   Toilet Transfer: Minimal assistance;Ambulation;Rolling walker (2 wheels)   Toileting- Clothing Manipulation and Hygiene: Min guard;Sit to/from stand       Functional mobility during ADLs: Rolling walker (2 wheels);Minimal assistance       Vision Baseline Vision/History: 1 Wears glasses Vision Assessment?: No apparent visual deficits     Perception     Praxis      Pertinent Vitals/Pain Pain Assessment Pain Assessment: No/denies pain     Hand Dominance Right  Extremity/Trunk Assessment Upper Extremity Assessment Upper Extremity Assessment: Generalized weakness   Lower Extremity Assessment Lower Extremity Assessment: Defer to PT evaluation       Communication Communication Communication: HOH   Cognition Arousal/Alertness: Awake/alert Behavior During Therapy: WFL for tasks assessed/performed Overall Cognitive Status:  Within Functional Limits for tasks assessed                                       General Comments       Exercises Exercises: Other exercises, General Upper Extremity General Exercises - Upper Extremity Shoulder Flexion: AROM, Strengthening, Both, 15 reps Shoulder ABduction: Strengthening, Both, 15 reps, Seated Elbow Flexion: Strengthening, Both, 15 reps Elbow Extension: Strengthening, Both, 15 reps Chair Push Up: 5 reps   Shoulder Instructions      Home Living Family/patient expects to be discharged to:: Private residence Living Arrangements: Alone Available Help at Discharge:  (family in [place], no friends/neighbors to help) Type of Home: House Home Access: Stairs to enter [employer] of Steps: 2 Entrance Stairs-Rails: Right Home Layout: One level;Laundry or work area in basement     [employer] Shower/Tub: Teacher, early years/pre:  (1 low 1 high) Bathroom Accessibility: Yes   Home Equipment: Cane - single point   Additional Comments: ex wife in area but no other or regular assistance available      Prior Functioning/Environment Prior Level of Function : Independent/Modified Independent             Mobility Comments: mod independent with SPC for limited household mobility. Pt reports he drives and does his own grocery shopping but fatogues after walking @ 3 ailse and can "barely make it back to the car" ADLs Comments: reports independence        OT Problem List: Decreased strength;Decreased activity tolerance;Impaired balance (sitting and/or standing);Decreased safety awareness;Decreased knowledge of use of DME or AE;Obesity      OT Treatment/Interventions: Self-care/ADL training;Therapeutic exercise;Energy conservation;DME and/or AE instruction;Therapeutic activities;Balance training;Patient/family education    OT Goals(Current goals can be found in the care plan section) Acute Rehab OT Goals Patient Stated Goal: to get  stronger OT Goal Formulation: With patient Time For Goal Achievement: 03/13/22 Potential to Achieve Goals: Good  OT Frequency: Min 2X/week    Co-evaluation              AM-PAC OT "6 Clicks" Daily Activity     Outcome Measure Help from another person eating meals?: None Help from another person taking care of personal grooming?: A Little Help from another person toileting, which includes using toliet, bedpan, or urinal?: A Little Help from another person bathing (including washing, rinsing, drying)?: None Help from another person to put on and taking off regular upper body clothing?: None Help from another person to put on and taking off regular lower body clothing?: None 6 Click Score: 22   End of Session Equipment Utilized During Treatment: Gait belt;Rolling walker (2 wheels) Nurse Communication: Mobility status  Activity Tolerance: Patient tolerated treatment well Patient left: in chair;with call bell/phone within reach  OT Visit Diagnosis: Unsteadiness on feet (R26.81);Other abnormalities of gait and mobility (R26.89);Repeated falls (R29.6);Muscle weakness (generalized) (M62.81)                Time: 2951-8841 OT Time Calculation (min): 19 min Charges:  OT General Charges $OT Visit: 1 Visit OT Evaluation $OT Eval Moderate Complexity: 1 Mod  .hil  Jullisa Grigoryan,HILLARY 02/27/2022, 1:54 PM

## 2022-02-27 NOTE — TOC Initial Note (Signed)
Transition of Care [facility]) - Initial/Assessment Note    Patient Details  Name: Calvin [name] [name]: [id number] Date of Birth: [birth year]  Transition of Care [facility]) CM/SW Contact:    Curlene Labrum, RN Phone Number: 02/27/2022, 2:31 PM  Clinical Narrative:                 CM met with the patient at the bedside to discuss transitions of care needs.  The patient lives alone and only living relative, daughter lives in [place], [place].  The patient is agreeable to SNF for care and will be medically ready for discharge in the next 2 days per MD.  Phoebe Perch completed and patient was faxed out in the hub.  I will present bed offers to the patient and afterwards, facility to start insurance authorization since [employer] is insurance provider.  CM will follow the patient for SNF placement needs.  Expected Discharge Plan: [place] Barriers to Discharge: Continued Medical Work up   Patient Goals and CMS Choice Patient states their goals for this hospitalization and ongoing recovery are:: To get better and go to Rehab CMS Medicare.gov Compare Post Acute Care list provided to:: Patient Choice offered to / list presented to : Patient  Expected Discharge Plan and Services Expected Discharge Plan: [place]   Discharge Planning Services: CM Consult Post Acute Care Choice: [place] Living arrangements for the past 2 months: Single Family Home                                      Prior Living Arrangements/Services Living arrangements for the past 2 months: Single Family Home Lives with:: Self Patient language and need for interpreter reviewed:: Yes Do you feel safe going back to the place where you live?: Yes      Need for Family Participation in Patient Care: Yes (Comment) Care giver support system in place?: Yes (comment) Current home services: DME (DME and Hearing aids at home) Criminal Activity/Legal Involvement Pertinent to Current  Situation/Hospitalization: No - Comment as needed  Activities of Daily Living Home Assistive Devices/Equipment: None ADL Screening (condition at time of admission) Patient's cognitive ability adequate to safely complete daily activities?: No Is the patient deaf or have difficulty hearing?: No Does the patient have difficulty seeing, even when wearing glasses/contacts?: No Does the patient have difficulty concentrating, remembering, or making decisions?: No Patient able to express need for assistance with ADLs?: Yes Does the patient have difficulty dressing or bathing?: Yes Independently performs ADLs?: No Communication: Independent Dressing (OT): Needs assistance Is this a change from baseline?: Pre-admission baseline Grooming: Needs assistance Is this a change from baseline?: Pre-admission baseline Feeding: Independent Bathing: Needs assistance Is this a change from baseline?: Pre-admission baseline Toileting: Needs assistance Is this a change from baseline?: Pre-admission baseline In/Out Bed: Needs assistance Is this a change from baseline?: Pre-admission baseline Walks in Home: Independent Does the patient have difficulty walking or climbing stairs?: Yes Weakness of Legs: Both Weakness of Arms/Hands: Both  Permission Sought/Granted Permission sought to share information with : Case Manager, Customer service manager, PCP, Family Supports Permission granted to share information with : Yes, Verbal Permission Granted     Permission granted to share info w AGENCY: SNF facility        Emotional Assessment Appearance:: Appears stated age Attitude/Demeanor/Rapport: Gracious Affect (typically observed): Accepting Orientation: : Oriented to Self, Oriented to Place,  Oriented to  Time, Oriented to Situation Alcohol / Substance Use: Not Applicable Psych Involvement: No (comment)  Admission diagnosis:  Diarrhea of presumed infectious origin [R19.7] Urinary retention  [R33.9] Prostate disorder [N42.9] Pyelonephritis [N12] AKI (acute kidney injury) ([place]) [N17.9] Nausea and vomiting, unspecified vomiting type [R11.2] Patient Active Problem List   Diagnosis Date Noted   Acute pyelonephritis 02/25/2022   AKI (acute kidney injury) ([place]) 02/25/2022   Pyelonephritis 02/25/2022   B12 deficiency 02/06/2022   Bilateral leg weakness 01/10/2022   Depression with anxiety 12/25/2021   Alcohol abuse 12/25/2021   Low back pain 06/09/2020   COVID-19 virus infection 02/24/2020   Primary osteoarthritis of left knee 04/23/2018   Hypogonadism in male 01/19/2016   Foot swelling 01/19/2016   Insomnia 10/30/2013   INTERNAL HEMORRHOIDS WITH OTHER COMPLICATION 95/53/9714   BPH (benign prostatic hyperplasia) 10/25/2008   NEPHROLITHIASIS, HX OF 10/25/2008   Headache 09/15/2007   Essential hypertension 07/07/2007   PCP:  Laurey Morale, MD Pharmacy:   [id number] [id number]  [id number]: [phone]: [phone]    Social Determinants of Health (SDOH) Interventions    Readmission Risk Interventions    02/27/2022    2:30 PM  Readmission Risk Prevention Plan  Post Dischage Appt Complete  Medication Screening Complete  Transportation Screening Complete

## 2022-02-27 NOTE — Evaluation (Deleted)
Occupational Therapy Evaluation Patient Details Name: Calvin [name] [name]: [id number] DOB: [birth year] Today's Date: 02/27/2022   History of Present Illness Patient is 76 y.o. male presented to hospital with complaints of flank pain and diarrhea found to have hypokalemia, bilateral hydronephrosis and suspicious malignant process in the prostate. PMH of BPH, HTN, anxiety, depression.   Clinical Impression   PTA pt lives alone independently. Pt states that he has experienced increased weakness over the last 6 weeks and has had numerous falls ( 5 in the week PTA). Given recent decline in functional status, recommend rehab @ SNF to achieve goal of returning home independently . Acute OT to follow.       Recommendations for follow up therapy are one component of a multi-disciplinary discharge planning process, led by the attending physician.  Recommendations may be updated based on patient status, additional functional criteria and insurance authorization.   Follow Up Recommendations  Skilled nursing-short term rehab (<3 hours/day)    Assistance Recommended at Discharge Intermittent Supervision/Assistance  Patient can return home with the following A little help with walking and/or transfers;A little help with bathing/dressing/bathroom;Assistance with cooking/housework;Help with stairs or ramp for entrance    Functional Status Assessment  Patient has had a recent decline in their functional status and demonstrates the ability to make significant improvements in function in a reasonable and predictable amount of time.  Equipment Recommendations  BSC/3in1    Recommendations for Other Services       Precautions / Restrictions Precautions Precautions: Fall Precaution Comments: 1 every week for a couple months, in the last week pt reports he fell about 5 times Restrictions Weight Bearing Restrictions: No      Mobility Bed Mobility Overal bed mobility:  (OOB in chair)                   Transfers Overall transfer level: Needs assistance Equipment used: Rolling walker (2 wheels) Transfers: Sit to/from Stand Sit to Stand: Min assist           General transfer comment: min assist to rise from low EOB height, pt required cues for hand placement, pt verbalized awareness of proper foot position to power up      Balance Overall balance assessment: Needs assistance, History of Falls Sitting-balance support: Feet supported Sitting balance-Leahy Scale: Good     Standing balance support: Reliant on assistive device for balance, Bilateral upper extremity supported, During functional activity Standing balance-Leahy Scale: Poor                             ADL either performed or assessed with clinical judgement   ADL Overall ADL's : Needs assistance/impaired     Grooming: Set up   Upper Body Bathing: Set up   Lower Body Bathing: Min guard   Upper Body Dressing : Set up   Lower Body Dressing: Min guard;Sit to/from stand   Toilet Transfer: Minimal assistance;Ambulation;Rolling walker (2 wheels)   Toileting- Clothing Manipulation and Hygiene: Min guard;Sit to/from stand       Functional mobility during ADLs: Rolling walker (2 wheels);Minimal assistance       Vision Baseline Vision/History: 1 Wears glasses Vision Assessment?: No apparent visual deficits     Perception     Praxis      Pertinent Vitals/Pain Pain Assessment Pain Assessment: No/denies pain     Hand Dominance Right   Extremity/Trunk Assessment Upper Extremity Assessment Upper Extremity Assessment: Generalized weakness  Lower Extremity Assessment Lower Extremity Assessment: Defer to PT evaluation       Communication Communication Communication: HOH   Cognition Arousal/Alertness: Awake/alert Behavior During Therapy: WFL for tasks assessed/performed Overall Cognitive Status: Within Functional Limits for tasks assessed                                        General Comments  Began discussion of alternative living situations/needs; pt has a daughter in [place] who wants him to move in with her; discussed recommendation for a fall alert system    Exercises Exercises: Other exercises, General Upper Extremity General Exercises - Upper Extremity Shoulder Flexion: AROM, Strengthening, Both, 15 reps Shoulder ABduction: Strengthening, Both, 15 reps, Seated Elbow Flexion: Strengthening, Both, 15 reps Elbow Extension: Strengthening, Both, 15 reps Chair Push Up: 5 reps   Shoulder Instructions      Home Living Family/patient expects to be discharged to:: Private residence Living Arrangements: Alone Available Help at Discharge:  (family in [place], no friends/neighbors to help) Type of Home: House Home Access: Stairs to enter [employer] of Steps: 2 Entrance Stairs-Rails: Right Home Layout: One level;Laundry or work area in basement     [employer] Shower/Tub: Teacher, early years/pre:  (1 low 1 high) Bathroom Accessibility: Yes   Home Equipment: Cane - single point   Additional Comments: ex wife in area but no other or regular assistance available      Prior Functioning/Environment Prior Level of Function : Independent/Modified Independent             Mobility Comments: mod independent with SPC for limited household mobility. Pt reports he drives and does his own grocery shopping but fatogues after walking @ 3 ailse and can "barely make it back to the car" ADLs Comments: reports independence        OT Problem List: Decreased strength;Decreased activity tolerance;Impaired balance (sitting and/or standing);Decreased safety awareness;Decreased knowledge of use of DME or AE;Obesity      OT Treatment/Interventions: Self-care/ADL training;Therapeutic exercise;Energy conservation;DME and/or AE instruction;Therapeutic activities;Balance training;Patient/family education    OT Goals(Current goals can be found in  the care plan section) Acute Rehab OT Goals Patient Stated Goal: to get stronger OT Goal Formulation: With patient Time For Goal Achievement: 03/13/22 Potential to Achieve Goals: Good ADL Goals Pt Will Perform Lower Body Bathing: with modified independence;sit to/from stand Pt Will Perform Lower Body Dressing: with modified independence;sit to/from stand Pt Will Transfer to Toilet: with modified independence;ambulating Pt Will Perform Toileting - Clothing Manipulation and hygiene: with modified independence Additional ADL Goal #1: Pt will independently verbalie 3 strategies to reduce risk of falls  OT Frequency: Min 2X/week    Co-evaluation              AM-PAC OT "6 Clicks" Daily Activity     Outcome Measure Help from another person eating meals?: None Help from another person taking care of personal grooming?: A Little Help from another person toileting, which includes using toliet, bedpan, or urinal?: A Little Help from another person bathing (including washing, rinsing, drying)?: None Help from another person to put on and taking off regular upper body clothing?: None Help from another person to put on and taking off regular lower body clothing?: None 6 Click Score: 22   End of Session Equipment Utilized During Treatment: Gait belt;Rolling walker (2 wheels) Nurse Communication: Mobility status  Activity Tolerance:  Patient tolerated treatment well Patient left: in chair;with call bell/phone within reach  OT Visit Diagnosis: Unsteadiness on feet (R26.81);Other abnormalities of gait and mobility (R26.89);Repeated falls (R29.6);Muscle weakness (generalized) (M62.81)                Time: 0962-8366 OT Time Calculation (min): 19 min Charges:  OT General Charges $OT Visit: 1 Visit OT Evaluation $OT Eval Moderate Complexity: [place], OT/L   Acute OT Clinical Specialist Acute Rehabilitation Services Pager [phone] Office [phone]    [facility] 02/27/2022, 2:00 PM

## 2022-02-27 NOTE — Progress Notes (Signed)
  Progress Note Patient: Calvin [name] VFI:[id number] DOB: Feb 10, 1946 DOA: 02/24/2022  DOS: the patient was seen and examined on 02/27/2022  Brief hospital course: PMH of BPH, HTN, anxiety, depression presented to hospital with complaints of flank pain and diarrhea found to have hypokalemia, bilateral hydronephrosis and suspicious malignant process in the prostate. Foley catheter inserted in the ER. Was started on IV antibiotics on admission for concern for UTI but no cultures were performed. Outpatient follow-up was recommended per urology per H&P  Assessment and Plan: Acute kidney injury secondary to obstructive uropathy. Bilateral hydronephrosis  Possible cystitis Presents with complaints of flank pain. CT abdomen shows bilateral hydronephrosis likely from bladder outlet obstruction from prostatic hypertrophy. Baseline serum creatinine normal. On presentation serum creatinine 1.97. Currently improving to normal. Foley catheter inserted in the ER. Urology was consulted by EDP.  Outpatient follow-up recommended based on the documentation.  Prostate enlargement with possible malignancy  CT [place] shows heterogeneously enlarged prostate with irregular thickening mass effect on bladder base and extension into bladder wall. No indication to check PSA in the setting of Foley insertion. 0.93 on 11/22.  Bilateral perinephric stranding. Concern for UTI/pyelonephritis/cystitis Has leukocytosis but no fever Patient was started on empiric IV ceftriaxone 2 g in the ER. Currently no cultures performed. We will continue empiric therapy for total 5 days.  Transition to p.o. today.  Severe hypokalemia. Hypomagnesemia Replaced. Now stable.  Aortic atherosclerosis Seen incidentally on the CT. Monitor.  Ongoing diarrhea. Patient reports to have diarrhea at home. Suspected to have viral enteritis. For last 4 days patient does not have a BM. Given that the patient had further loose BM here in the  hospital check C. difficile.  Deconditioning. Patient lives alone. PT OT recommended SNF. Social worker consulted. Medically stable for discharge on 9/13.  Subjective: No bowel movement so far.  No nausea no vomiting no fever no chills.  Oral intake adequate.  Physical Exam: Vitals:   02/27/22 0342 02/27/22 0733 02/27/22 1649 02/27/22 1927  BP: 123/89 (!) 141/94 (!) 141/101 135/86  Pulse: 82 90 86 75  Resp: '18 16 16 16  '$ Temp: 99.8 F (37.7 C) 98.7 F (37.1 C) 98 F (36.7 C) 98.4 F (36.9 C)  TempSrc: Oral Oral Oral Oral  SpO2: 98% 96% 99% 97%  Weight:      Height:       General: Appear in mild distress; no visible Abnormal Neck Mass Or lumps, Conjunctiva normal Cardiovascular: S1 and S2 Present, aortic systolic Murmur, Respiratory: good respiratory effort, Bilateral Air entry present and CTA, no Crackles, no wheezes Abdomen: Bowel Sound present, Non tender Extremities: no Pedal edema Neurology: alert and oriented to Self, Place and time.   Data Reviewed: I have Reviewed nursing notes, Vitals, and Lab results since pt's last encounter. Pertinent lab results CBC and BMP I have ordered test including BMP     Family Communication: No one at bedside  Disposition: Status is: Inpatient Remains inpatient appropriate because: Awaiting SNF placement  Author: Berle Mull, MD 02/27/2022 7:43 PM  Please look on [email or web address] to find out who is on call.

## 2022-02-27 NOTE — Evaluation (Signed)
Physical Therapy Evaluation Patient Details Name: Calvin [name] [name]: [id number] DOB: 1945-11-13 Today's Date: 02/27/2022  History of Present Illness  Patient is 76 y.o. male presented to hospital with complaints of flank pain and diarrhea found to have hypokalemia, bilateral hydronephrosis and suspicious malignant process in the prostate. PMH of BPH, HTN, anxiety, depression.    Clinical Impression  Calvin [name] is 76 y.o. male admitted with above HPI and diagnosis. Patient is currently limited by functional impairments below (see PT problem list). Patient lives alone and is modified independent with intermittent use of SPC for mobility at baseline. Pt is a limited community ambulator at baseline and most often stays home. Currently he requires Min Assist for transfers and gait with RW and has not assist available at home. Patient will benefit from continued skilled PT interventions to address impairments and progress independence with mobility, recommending ST rehab at [facility]. Acute PT will follow and progress as able.        Recommendations for follow up therapy are one component of a multi-disciplinary discharge planning process, led by the attending physician.  Recommendations may be updated based on patient status, additional functional criteria and insurance authorization.  Follow Up Recommendations Skilled nursing-short term rehab (<3 hours/day) Can patient physically be transported by private vehicle: No (no family available to transport pt)    Assistance Recommended at Discharge Intermittent Supervision/Assistance  Patient can return home with the following  A little help with walking and/or transfers;A little help with bathing/dressing/bathroom;Assistance with cooking/housework;Direct supervision/assist for medications management;Assist for transportation;Help with stairs or ramp for entrance    Equipment Recommendations Rolling walker (2 wheels)  Recommendations for Other  Services       Functional Status Assessment Patient has had a recent decline in their functional status and demonstrates the ability to make significant improvements in function in a reasonable and predictable amount of time.     Precautions / Restrictions Precautions Precautions: Fall Precaution Comments: 1 every week for a couple months, in the last week pt reports he fell about 5 times Restrictions Weight Bearing Restrictions: No      Mobility  Bed Mobility Overal bed mobility: Needs Assistance Bed Mobility: Supine to Sit     Supine to sit: Min guard, HOB elevated     General bed mobility comments: pt taking extra time to pivot/raise trunk to EOB    Transfers Overall transfer level: Needs assistance Equipment used: Rolling walker (2 wheels) Transfers: Sit to/from Stand Sit to Stand: Min assist           General transfer comment: min assist to rise from low EOB height, pt required cues for hand placement, pt verbalized awareness of proper foot position to power up    Ambulation/Gait Ambulation/Gait assistance: Min assist Gait Distance (Feet): 80 Feet Assistive device: Rolling walker (2 wheels) Gait Pattern/deviations: Step-through pattern, Decreased stride length, Decreased step length - right, Decreased stance time - left, Trunk flexed, Shuffle Gait velocity: decr     General Gait Details: pt with poor hip flexion and shuffled low steps with poor foot clearance. cues required to maintain safe proximity to RW and for heel to to pattern to facilitate improved dorsiflexion/hip flexion. pt taking shortened steps with turning and when fatigued.  Stairs            Wheelchair Mobility    Modified Rankin (Stroke Patients Only)       Balance Overall balance assessment: Needs assistance, History of Falls Sitting-balance support: Feet supported Sitting  balance-Leahy Scale: Good     Standing balance support: Reliant on assistive device for balance,  Bilateral upper extremity supported, During functional activity Standing balance-Leahy Scale: Poor                               Pertinent Vitals/Pain Pain Assessment Pain Assessment: No/denies pain    Home Living Family/patient expects to be discharged to:: Private residence Living Arrangements: Alone Available Help at Discharge:  (family in [place], no friends/neighbors to help) Type of Home: House Home Access: Stairs to enter Entrance Stairs-Rails: Right Entrance Stairs-Number of Steps: 2   Home Layout: One level;Laundry or work area in [place]: Kasandra Knudsen - single point Additional Comments: ex wife in area but no other or regular assistance available    Prior Function Prior Level of Function : Independent/Modified Independent             Mobility Comments: mod independent with SPC for limited household mobility. Pt reports he does his own grocery shopping but unclear how often or how long his trips are. ADLs Comments: reports independence     Hand Dominance   Dominant Hand: Right    Extremity/Trunk Assessment   Upper Extremity Assessment Upper Extremity Assessment: Generalized weakness;Defer to OT evaluation    Lower Extremity Assessment Lower Extremity Assessment: Generalized weakness;RLE deficits/detail;LLE deficits/detail RLE Deficits / Details: hip weakness 3+/5 for hip fleixon, 4/5 for knee extension/flexion and dorsiflexion RLE Sensation: WNL RLE Coordination: WNL LLE Deficits / Details: hip weakness 3+/5 for hip fleixon, 4/5 for knee extension/flexion and dorsiflexion LLE Sensation: WNL LLE Coordination: WNL    Cervical / Trunk Assessment Cervical / Trunk Assessment: Normal  Communication   Communication: HOH (doesn't have his hearing aids)  Cognition Arousal/Alertness: Awake/alert Behavior During Therapy: WFL for tasks assessed/performed Overall Cognitive Status: Within Functional Limits for tasks assessed                                           General Comments      Exercises     Assessment/Plan    PT Assessment Patient needs continued PT services  PT Problem List Decreased strength;Decreased activity tolerance;Decreased balance;Decreased mobility;Decreased knowledge of use of DME;Decreased knowledge of precautions;Decreased safety awareness       PT Treatment Interventions DME instruction;Gait training;Stair training;Functional mobility training;Therapeutic activities;Therapeutic exercise;Balance training;Patient/family education    PT Goals (Current goals can be found in the Care Plan section)  Acute Rehab PT Goals Patient Stated Goal: get stronger, improve balance, stop falling. PT Goal Formulation: With patient Time For Goal Achievement: 03/13/22 Potential to Achieve Goals: Good    Frequency Min 3X/week     Co-evaluation               AM-PAC PT "6 Clicks" Mobility  Outcome Measure Help needed turning from your back to your side while in a flat bed without using bedrails?: A Little Help needed moving from lying on your back to sitting on the side of a flat bed without using bedrails?: A Little Help needed moving to and from a bed to a chair (including a wheelchair)?: A Little Help needed standing up from a chair using your arms (e.g., wheelchair or bedside chair)?: A Little Help needed to walk in hospital room?: A Little Help needed climbing 3-5 steps with a railing? : A  Lot 6 Click Score: 17    End of Session Equipment Utilized During Treatment: Gait belt Activity Tolerance: Patient tolerated treatment well Patient left: in chair;with call bell/phone within reach Nurse Communication: Mobility status PT Visit Diagnosis: Muscle weakness (generalized) (M62.81);Difficulty in walking, not elsewhere classified (R26.2);Unsteadiness on feet (R26.81);History of falling (Z91.81)    Time: 4445-8483 PT Time Calculation (min) (ACUTE ONLY): 33 min   Charges:   PT  Evaluation $PT Eval Moderate Complexity: 1 Mod PT Treatments $Gait Training: 8-22 mins        Verner Mould, DPT Acute Rehabilitation Services Office [phone] Pager [phone]  02/27/22 11:39 AM

## 2022-02-27 NOTE — NC FL2 (Signed)
[place] LEVEL OF CARE SCREENING TOOL     IDENTIFICATION  Patient Name: Calvin [name] Birthdate: 06-24-1945 Sex: male Admission Date (Current Location): 02/24/2022  [facility] and [place] Number:  Herbalist and Address:  [employer], [place] [address], [place], [place] [address]      Provider Number: [id number]  Attending Physician Name and Address:  Lavina Hamman, MD  Relative Name and Phone Number:       Current Level of Care: Hospital Recommended Level of Care: [place] Prior Approval Number:    Date Approved/Denied:   PASRR Number: [id number]  Discharge Plan: SNF    Current Diagnoses: Patient Active Problem List   Diagnosis Date Noted   Acute pyelonephritis 02/25/2022   AKI (acute kidney injury) ([place]) 02/25/2022   Pyelonephritis 02/25/2022   B12 deficiency 02/06/2022   Bilateral leg weakness 01/10/2022   Depression with anxiety 12/25/2021   Alcohol abuse 12/25/2021   Low back pain 06/09/2020   COVID-19 virus infection 02/24/2020   Primary osteoarthritis of left knee 04/23/2018   Hypogonadism in male 01/19/2016   Foot swelling 01/19/2016   Insomnia 10/30/2013   INTERNAL HEMORRHOIDS WITH OTHER COMPLICATION 37/85/8850   BPH (benign prostatic hyperplasia) 10/25/2008   NEPHROLITHIASIS, HX OF 10/25/2008   Headache 09/15/2007   Essential hypertension 07/07/2007    Orientation RESPIRATION BLADDER Height & Weight     Self, Time, Situation, Place  Normal Indwelling catheter Weight: 99.2 kg Height:  6' (182.9 cm)  BEHAVIORAL SYMPTOMS/MOOD NEUROLOGICAL BOWEL NUTRITION STATUS      Continent Diet  AMBULATORY STATUS COMMUNICATION OF NEEDS Skin   Limited Assist Verbally Normal                       Personal Care Assistance Level of Assistance  Bathing, Feeding, Dressing Bathing Assistance: Limited assistance Feeding assistance: Independent Dressing Assistance: Limited assistance      Functional Limitations Info  Sight, Hearing, Speech Sight Info: Impaired (Wears Glasses) Hearing Info: Impaired (Wears Hearing Aids) Speech Info: Adequate    SPECIAL CARE FACTORS FREQUENCY  PT (By licensed PT), OT (By licensed OT)     PT Frequency: 3-5 x per week OT Frequency: 3-5 x per week            Contractures Contractures Info: Not present    Additional Factors Info  Code Status, Allergies, Psychotropic, Isolation Precautions Code Status Info: Full code Allergies Info: NKDA Psychotropic Info: Valium, Zoloft   Isolation Precautions Info: Enteric Precautions at this time     Current Medications (02/27/2022):  This is the current hospital active medication list Current Facility-Administered Medications  Medication Dose Route Frequency Provider Last Rate Last Admin   acetaminophen (TYLENOL) tablet 650 mg  650 mg Oral Q6H PRN Wynetta Fines T, MD       Or   acetaminophen (TYLENOL) suppository 650 mg  650 mg Rectal Q6H PRN Wynetta Fines T, MD       acidophilus (RISAQUAD) capsule 2 capsule  2 capsule Oral TID Wynetta Fines T, MD   2 capsule at 02/27/22 0926   amLODipine (NORVASC) tablet 5 mg  5 mg Oral Daily Wynetta Fines T, MD   5 mg at 02/27/22 0926   cefdinir (OMNICEF) capsule 300 mg  300 mg Oral Q12H Lavina Hamman, MD   300 mg at 02/27/22 1137   diazepam (VALIUM) tablet 2 mg  2 mg Oral Q12H PRN Lequita Halt,  MD       finasteride (PROSCAR) tablet 5 mg  5 mg Oral Daily Wynetta Fines T, MD   5 mg at 02/27/22 0926   heparin injection 5,000 Units  5,000 Units Subcutaneous Q8H Wynetta Fines T, MD   5,000 Units at 02/27/22 0522   HYDROmorphone (DILAUDID) injection 0.5-1 mg  0.5-1 mg Intravenous Q2H PRN Wynetta Fines T, MD       labetalol (NORMODYNE) injection 10 mg  10 mg Intravenous Q4H PRN Lequita Halt, MD       lactated ringers infusion   Intravenous Continuous Lavina Hamman, MD 100 mL/hr at 02/27/22 0521 New Bag at 02/27/22 0521   loperamide (IMODIUM) capsule 2 mg  2 mg Oral PRN  Lequita Halt, MD       ondansetron [facility]) tablet 4 mg  4 mg Oral Q6H PRN Wynetta Fines T, MD       Or   ondansetron [facility]) injection 4 mg  4 mg Intravenous Q6H PRN Wynetta Fines T, MD       sertraline (ZOLOFT) tablet 100 mg  100 mg Oral Daily Wynetta Fines T, MD   100 mg at 02/27/22 1833   tamsulosin (FLOMAX) capsule 0.4 mg  0.4 mg Oral Daily Wynetta Fines T, MD   0.4 mg at 02/27/22 5825     Discharge Medications: Please see discharge summary for a list of discharge medications.  Relevant Imaging Results:  Relevant Lab Results:   Additional Information SS# [id number]  Curlene Labrum, RN

## 2022-02-28 DIAGNOSIS — N10 Acute tubulo-interstitial nephritis: Secondary | ICD-10-CM | POA: Diagnosis not present

## 2022-02-28 DIAGNOSIS — N179 Acute kidney failure, unspecified: Secondary | ICD-10-CM

## 2022-02-28 DIAGNOSIS — R339 Retention of urine, unspecified: Secondary | ICD-10-CM | POA: Diagnosis not present

## 2022-02-28 LAB — CBC
HCT: 35.6 % — ABNORMAL LOW (ref 39.0–52.0)
Hemoglobin: 13.1 g/dL (ref 13.0–17.0)
MCH: 34.7 pg — ABNORMAL HIGH (ref 26.0–34.0)
MCHC: 36.8 g/dL — ABNORMAL HIGH (ref 30.0–36.0)
MCV: 94.2 fL (ref 80.0–100.0)
Platelets: 241 10*3/uL (ref 150–400)
RBC: 3.78 MIL/uL — ABNORMAL LOW (ref 4.22–5.81)
RDW: 11.4 % — ABNORMAL LOW (ref 11.5–15.5)
WBC: 7.5 10*3/uL (ref 4.0–10.5)
nRBC: 0 % (ref 0.0–0.2)

## 2022-02-28 LAB — MAGNESIUM: Magnesium: 1.4 mg/dL — ABNORMAL LOW (ref 1.7–2.4)

## 2022-02-28 LAB — BASIC METABOLIC PANEL
Anion gap: 8 (ref 5–15)
BUN: 14 mg/dL (ref 8–23)
CO2: 23 mmol/L (ref 22–32)
Calcium: 8.1 mg/dL — ABNORMAL LOW (ref 8.9–10.3)
Chloride: 103 mmol/L (ref 98–111)
Creatinine, Ser: 0.86 mg/dL (ref 0.61–1.24)
GFR, Estimated: >60 mL/min (ref >60)
Glucose, Bld: 96 mg/dL (ref 70–99)
Potassium: 3.7 mmol/L (ref 3.5–5.1)
Sodium: 134 mmol/L — ABNORMAL LOW (ref 135–145)

## 2022-02-28 MED ORDER — CHLORHEXIDINE GLUCONATE CLOTH 2 % EX PADS
6.0000 | MEDICATED_PAD | Freq: Every day | CUTANEOUS | Status: DC
  Administered 2022-02-28 – 2022-03-02 (×3): 6 via TOPICAL

## 2022-02-28 MED ORDER — MAGNESIUM SULFATE 2 GM/50ML IV SOLN
2.0000 g | Freq: Once | INTRAVENOUS | Status: AC
  Administered 2022-02-28: 2 g via INTRAVENOUS
  Filled 2022-02-28: qty 50

## 2022-02-28 NOTE — TOC Progression Note (Addendum)
Transition of Care [facility]) - Progression Note    Patient Details  Name: BORIS ENGELMANN [name]: [id number] Date of Birth: 31-Jul-1945  Transition of Care [facility]) CM/SW [place], RN Phone Number: 02/28/2022, 12:25 PM  Clinical Narrative:    CM spoke with the patient and gave Medicare choice regarding SNF placement and the patient chose [place].  I called [facility] and left a message to accept the bed offer and start insurance authorization for the patient.  02/28/2022 1547 - CM spoke with Shirlee Limerick, [place] at [facility] and the facility will start insurance authorization and is aware that the patient is requesting a private room.   Expected Discharge Plan: [place] Barriers to Discharge: Continued Medical Work up  Expected Discharge Plan and Services Expected Discharge Plan: [place]   Discharge Planning Services: CM Consult Post Acute Care Choice: [place] Living arrangements for the past 2 months: Single Family Home                                       Social Determinants of Health (SDOH) Interventions    Readmission Risk Interventions    02/27/2022    2:30 PM  Readmission Risk Prevention Plan  Post Dischage Appt Complete  Medication Screening Complete  Transportation Screening Complete

## 2022-02-28 NOTE — Progress Notes (Signed)
  Progress Note   Patient: Calvin [name] SNK:[id number] DOB: [birth year] DOA: 02/24/2022     3 DOS: the patient was seen and examined on 02/28/2022   Brief hospital course:  Assessment and Plan: Acute kidney injury secondary to obstructive uropathy. Bilateral hydronephrosis  Possible cystitis -- CT showed bilateral hydronephrosis likely from bladder outlet obstruction from prostatic hypertrophy. --Foley catheter inserted in the ER. Urology was consulted by EDP.  Outpatient follow-up recommended based on the documentation.   Prostate enlargement with possible malignancy  --CT abdomen/pelvis showed heterogeneously enlarged prostate with irregular thickening mass effect on bladder base and extension into bladder wall. No indication to check PSA in the setting of Foley insertion. 0.93 on 11/22.   Bilateral perinephric stranding. Concern for UTI/pyelonephritis/cystitis --treating with empiric therapy, no cultures performed   Hypomagnesemia --replete   Aortic atherosclerosis --follow-up as an outpatient   Ongoing diarrhea. --resolved. Suspected to have viral enteritis.   Deconditioning. --Patient lives alone. PT OT recommended SNF.      Subjective:  Feels fine Eating fine No n/v Diarrhea resolved  Physical Exam: Vitals:   02/27/22 1649 02/27/22 1927 02/28/22 0414 02/28/22 0744  BP: (!) 141/101 135/86 (!) 127/91 (!) 152/94  Pulse: 86 75 70 84  Resp: '16 16 18 18  '$ Temp: 98 F (36.7 C) 98.4 F (36.9 C) 99 F (37.2 C) 98.4 F (36.9 C)  TempSrc: Oral Oral Oral Oral  SpO2: 99% 97% 96% 94%  Weight:      Height:       Physical Exam Vitals reviewed.  Constitutional:      General: He is not in acute distress.    Appearance: He is not ill-appearing or toxic-appearing.  Cardiovascular:     Rate and Rhythm: Normal rate and regular rhythm.     Heart sounds: No murmur heard. Pulmonary:     Effort: Pulmonary effort is normal. No respiratory distress.     Breath sounds: No  wheezing, rhonchi or rales.  Neurological:     Mental Status: He is alert.  Psychiatric:        Mood and Affect: Mood normal.        Behavior: Behavior normal.     Data Reviewed:  UOP 3275 Mg2+ 1.4 BMP noted  Family Communication:   Disposition: Status is: Inpatient Remains inpatient appropriate because: await SNF  Planned Discharge Destination: Skilled nursing facility    Time spent: 25 minutes  Author: Murray Hodgkins, MD 02/28/2022 11:52 AM  For on call review [email or web address].

## 2022-03-01 DIAGNOSIS — N429 Disorder of prostate, unspecified: Secondary | ICD-10-CM

## 2022-03-01 DIAGNOSIS — N179 Acute kidney failure, unspecified: Secondary | ICD-10-CM | POA: Diagnosis not present

## 2022-03-01 DIAGNOSIS — N12 Tubulo-interstitial nephritis, not specified as acute or chronic: Secondary | ICD-10-CM | POA: Diagnosis not present

## 2022-03-01 LAB — BASIC METABOLIC PANEL
Anion gap: 8 (ref 5–15)
BUN: 15 mg/dL (ref 8–23)
CO2: 25 mmol/L (ref 22–32)
Calcium: 8.6 mg/dL — ABNORMAL LOW (ref 8.9–10.3)
Chloride: 102 mmol/L (ref 98–111)
Creatinine, Ser: 1.09 mg/dL (ref 0.61–1.24)
GFR, Estimated: >60 mL/min (ref >60)
Glucose, Bld: 102 mg/dL — ABNORMAL HIGH (ref 70–99)
Potassium: 3.6 mmol/L (ref 3.5–5.1)
Sodium: 135 mmol/L (ref 135–145)

## 2022-03-01 LAB — CBC
HCT: 40.8 % (ref 39.0–52.0)
Hemoglobin: 14.4 g/dL (ref 13.0–17.0)
MCH: 33.6 pg (ref 26.0–34.0)
MCHC: 35.3 g/dL (ref 30.0–36.0)
MCV: 95.3 fL (ref 80.0–100.0)
Platelets: 282 10*3/uL (ref 150–400)
RBC: 4.28 MIL/uL (ref 4.22–5.81)
RDW: 11.5 % (ref 11.5–15.5)
WBC: 7.5 10*3/uL (ref 4.0–10.5)
nRBC: 0 % (ref 0.0–0.2)

## 2022-03-01 LAB — MAGNESIUM: Magnesium: 1.6 mg/dL — ABNORMAL LOW (ref 1.7–2.4)

## 2022-03-01 MED ORDER — MAGNESIUM SULFATE 2 GM/50ML IV SOLN
2.0000 g | Freq: Once | INTRAVENOUS | Status: AC
  Administered 2022-03-01: 2 g via INTRAVENOUS
  Filled 2022-03-01: qty 50

## 2022-03-01 NOTE — Progress Notes (Signed)
Physical Therapy Treatment Patient Details Name: Calvin [name] [name]: [id number] DOB: 14-Apr-1946 Today's Date: 03/01/2022   History of Present Illness Patient is 76 y.o. male presented to hospital with complaints of flank pain and diarrhea found to have hypokalemia, bilateral hydronephrosis and suspicious malignant process in the prostate. PMH of BPH, HTN, anxiety, depression.    PT Comments    Pt received in supine, agreeable to therapy session and states "I haven't been out of the bed in days" with good effort for transfer and gait training and stair negotiation. Pt needing up to heavy minA with mod safety cues for sit<>stand with RW and increased time to perform household distance gait trial with heavy reliance on RW. Pt with personal cane in room but preferring to use RW currently and agrees RW is safer device for him at this time. Pt continues to benefit from PT services to progress toward functional mobility goals.    Recommendations for follow up therapy are one component of a multi-disciplinary discharge planning process, led by the attending physician.  Recommendations may be updated based on patient status, additional functional criteria and insurance authorization.  Follow Up Recommendations  Skilled nursing-short term rehab (<3 hours/day) Can patient physically be transported by private vehicle: No (no family available to transport pt)   Assistance Recommended at Discharge Intermittent Supervision/Assistance  Patient can return home with the following A little help with walking and/or transfers;A little help with bathing/dressing/bathroom;Assistance with cooking/housework;Direct supervision/assist for medications management;Assist for transportation;Help with stairs or ramp for entrance   Equipment Recommendations  Rolling walker (2 wheels)    Recommendations for Other Services       Precautions / Restrictions Precautions Precautions: Fall Precaution Comments: 1 every week for  a couple months, in the last week pt reports he fell about 5 times Restrictions Weight Bearing Restrictions: No     Mobility  Bed Mobility Overal bed mobility: Needs Assistance Bed Mobility: Supine to Sit     Supine to sit: HOB elevated, Min assist     General bed mobility comments: pt taking extra time to pivot/raise trunk to EOB    Transfers Overall transfer level: Needs assistance Equipment used: Rolling walker (2 wheels) Transfers: Sit to/from Stand Sit to Stand: Min assist           General transfer comment: min assist to rise from low EOB height, pt required cues for hand placement/not to pull up on RW; from EOB and to recliner; use of momentum to achieve upright    Ambulation/Gait Ambulation/Gait assistance: Min assist Gait Distance (Feet): 60 Feet (41f, seated break, then 637f Assistive device: Rolling walker (2 wheels) Gait Pattern/deviations: Decreased stride length, Decreased step length - right, Decreased stance time - left, Shuffle, Step-to pattern, [place] Gait velocity: <0.2 m/s     General Gait Details: downward gaze despite cues for upright posture and small shuffled steps, does better with consistent cues each step for improved heel strike and step length. Pt needs cues for activity pacing and energy conservation with poor carryover.   Stairs Stairs: Yes Stairs assistance: Min assist Stair Management: Two rails, Step to pattern, Forwards, With walker Number of Stairs: 5 General stair comments: RW to simulate bilateral handrails; increased time to perform and cues for step sequencing and activity pacing; step-ups onto 7" step in room x 5 reps   Wheelchair Mobility    Modified Rankin (Stroke Patients Only)       Balance Overall balance assessment: Needs assistance, History of Falls Sitting-balance  support: Feet supported Sitting balance-Leahy Scale: Good     Standing balance support: Reliant on assistive device for balance, Bilateral  upper extremity supported, During functional activity Standing balance-Leahy Scale: Poor                              Cognition Arousal/Alertness: Awake/alert Behavior During Therapy: WFL for tasks assessed/performed Overall Cognitive Status: Within Functional Limits for tasks assessed         General Comments: slow processing, hard of hearing        Exercises      General Comments General comments (skin integrity, edema, etc.): Pt reports his hearing aid battery is dead, pt put new battery in but reports it still is not working. VSS on RA per chart review and no acute s/sx distress. Pt up in recliner to eat dinner with chair alarm on for safety at end of session.      Pertinent Vitals/Pain Pain Assessment Pain Assessment: Faces Faces Pain Scale: Hurts little more Pain Location: low back pain (per pt it is not new) Pain Descriptors / Indicators: Discomfort, Grimacing Pain Intervention(s): Monitored during session, Repositioned     PT Goals (current goals can now be found in the care plan section) Acute Rehab PT Goals Patient Stated Goal: get stronger, improve balance, stop falling. PT Goal Formulation: With patient Time For Goal Achievement: 03/13/22 Progress towards PT goals: Progressing toward goals    Frequency    Min 3X/week      PT Plan Current plan remains appropriate       AM-PAC PT "6 Clicks" Mobility   Outcome Measure  Help needed turning from your back to your side while in a flat bed without using bedrails?: A Little Help needed moving from lying on your back to sitting on the side of a flat bed without using bedrails?: A Little Help needed moving to and from a bed to a chair (including a wheelchair)?: A Little Help needed standing up from a chair using your arms (e.g., wheelchair or bedside chair)?: A Lot (mod safety cues for this and all items below) Help needed to walk in hospital room?: A Lot Help needed climbing 3-5 steps with a  railing? : A Lot 6 Click Score: 15    End of Session Equipment Utilized During Treatment: Gait belt Activity Tolerance: Patient tolerated treatment well Patient left: in chair;with call bell/phone within reach;with chair alarm set Nurse Communication: Mobility status PT Visit Diagnosis: Muscle weakness (generalized) (M62.81);Difficulty in walking, not elsewhere classified (R26.2);Unsteadiness on feet (R26.81);History of falling (Z91.81)     Time: 0092-3300 PT Time Calculation (min) (ACUTE ONLY): 29 min  Charges:  $Gait Training: 8-22 mins $Therapeutic Activity: 8-22 mins                     Misa Fedorko P., PTA Acute Rehabilitation Services Secure Chat Preferred 9a-5:30pm Office: [place] 03/01/2022, 5:29 PM

## 2022-03-01 NOTE — Progress Notes (Signed)
  Progress Note   Patient: Calvin [name] XJD:[id number] DOB: Jun 22, 1945 DOA: 02/24/2022     4 DOS: the patient was seen and examined on 03/01/2022   Brief hospital course:  Assessment and Plan: Acute kidney injury secondary to obstructive uropathy. Bilateral hydronephrosis  Possible cystitis -- CT showed bilateral hydronephrosis likely from bladder outlet obstruction from prostatic hypertrophy. --Foley catheter inserted in the ER. Urology was consulted by EDP.  Outpatient follow-up recommended based on the documentation. --Acute kidney injury resolved.   Prostate enlargement with possible malignancy  --CT abdomen/pelvis showed heterogeneously enlarged prostate with irregular thickening mass effect on bladder base and extension into bladder wall. No indication to check PSA in the setting of Foley insertion.    Bilateral perinephric stranding. Concern for UTI/pyelonephritis/cystitis --treating with empiric therapy, no cultures performed   Hypomagnesemia --replete again today   Aortic atherosclerosis --follow-up as an outpatient   Ongoing diarrhea. --resolved. Suspected to have viral enteritis.   Deconditioning. --Patient lives alone. PT OT recommended SNF.      Subjective:  Feels ok today Breathing fine Eating fine Slept poorly  Physical Exam: Vitals:   02/28/22 1621 02/28/22 2122 03/01/22 0358 03/01/22 0802  BP: 130/87 (!) 143/75 118/84 (!) 136/92  Pulse: 81 85 82 88  Resp: '19 18 18 16  '$ Temp: 98.1 F (36.7 C) 98.5 F (36.9 C) 98 F (36.7 C) 98.6 F (37 C)  TempSrc: Oral Oral Oral Oral  SpO2: 96% 98% 96% 96%  Weight:      Height:       Physical Exam Vitals reviewed.  Constitutional:      General: He is not in acute distress.    Appearance: He is not ill-appearing or toxic-appearing.  Cardiovascular:     Rate and Rhythm: Normal rate and regular rhythm.     Heart sounds: No murmur heard. Pulmonary:     Effort: No respiratory distress.     Breath sounds:  No wheezing, rhonchi or rales.  Neurological:     Mental Status: He is alert.  Psychiatric:        Behavior: Behavior normal.     Data Reviewed:  UOP 5.6L Mg 1.6 Creatinine stable 1.09  Family Communication:   Disposition: Status is: Inpatient Remains inpatient appropriate because: awaiting SNF  Planned Discharge Destination: Skilled nursing facility    Time spent: 20 minutes  Author: Murray Hodgkins, MD 03/01/2022 4:41 PM  For on call review [email or web address].

## 2022-03-01 NOTE — TOC Progression Note (Signed)
Transition of Care [facility]) - Progression Note    Patient Details  Name: Calvin [name] [name]: [id number] Date of Birth: [birth year]  Transition of Care [facility]) CM/SW [place], RN Phone Number: 03/01/2022, 9:05 AM  Clinical Narrative:    The patient's daughter called and asked that another SNF facility outside the area be selected for placement considering the two accepting facilities were sited for safety issues.  I called Ebony Hail, CM at [facility] facility and they are able to accept the patient for admission and start authorization today.  The patient and daughter are in agreement.   Expected Discharge Plan: [place] Barriers to Discharge: Continued Medical Work up  Expected Discharge Plan and Services Expected Discharge Plan: [place]   Discharge Planning Services: CM Consult Post Acute Care Choice: [place] Living arrangements for the past 2 months: Single Family Home                                       Social Determinants of Health (SDOH) Interventions    Readmission Risk Interventions    02/27/2022    2:30 PM  Readmission Risk Prevention Plan  Post Dischage Appt Complete  Medication Screening Complete  Transportation Screening Complete

## 2022-03-01 NOTE — Care Management Important Message (Signed)
Important Message  Patient Details  Name: Calvin [name] [name]: [id number] Date of Birth: 11-28-1945   Medicare Important Message Given:  Yes     Orbie Pyo 03/01/2022, 2:45 PM

## 2022-03-02 DIAGNOSIS — I700 Atherosclerosis of aorta: Secondary | ICD-10-CM | POA: Diagnosis not present

## 2022-03-02 DIAGNOSIS — N179 Acute kidney failure, unspecified: Secondary | ICD-10-CM | POA: Diagnosis not present

## 2022-03-02 DIAGNOSIS — G20 Parkinson's disease: Secondary | ICD-10-CM | POA: Diagnosis not present

## 2022-03-02 DIAGNOSIS — N429 Disorder of prostate, unspecified: Secondary | ICD-10-CM | POA: Diagnosis not present

## 2022-03-02 DIAGNOSIS — R2689 Other abnormalities of gait and mobility: Secondary | ICD-10-CM | POA: Diagnosis not present

## 2022-03-02 DIAGNOSIS — N3281 Overactive bladder: Secondary | ICD-10-CM | POA: Diagnosis not present

## 2022-03-02 DIAGNOSIS — Z741 Need for assistance with personal care: Secondary | ICD-10-CM | POA: Diagnosis not present

## 2022-03-02 DIAGNOSIS — N10 Acute tubulo-interstitial nephritis: Secondary | ICD-10-CM | POA: Diagnosis not present

## 2022-03-02 DIAGNOSIS — R339 Retention of urine, unspecified: Secondary | ICD-10-CM | POA: Diagnosis not present

## 2022-03-02 DIAGNOSIS — R338 Other retention of urine: Secondary | ICD-10-CM | POA: Diagnosis not present

## 2022-03-02 DIAGNOSIS — N139 Obstructive and reflux uropathy, unspecified: Secondary | ICD-10-CM

## 2022-03-02 DIAGNOSIS — R69 Illness, unspecified: Secondary | ICD-10-CM | POA: Diagnosis not present

## 2022-03-02 DIAGNOSIS — Z7401 Bed confinement status: Secondary | ICD-10-CM | POA: Diagnosis not present

## 2022-03-02 DIAGNOSIS — M6281 Muscle weakness (generalized): Secondary | ICD-10-CM | POA: Diagnosis not present

## 2022-03-02 DIAGNOSIS — R531 Weakness: Secondary | ICD-10-CM | POA: Diagnosis not present

## 2022-03-02 DIAGNOSIS — R262 Difficulty in walking, not elsewhere classified: Secondary | ICD-10-CM | POA: Diagnosis not present

## 2022-03-02 DIAGNOSIS — N1330 Unspecified hydronephrosis: Secondary | ICD-10-CM

## 2022-03-02 DIAGNOSIS — R5381 Other malaise: Secondary | ICD-10-CM | POA: Diagnosis not present

## 2022-03-02 DIAGNOSIS — N401 Enlarged prostate with lower urinary tract symptoms: Secondary | ICD-10-CM | POA: Diagnosis not present

## 2022-03-02 DIAGNOSIS — N320 Bladder-neck obstruction: Secondary | ICD-10-CM | POA: Diagnosis not present

## 2022-03-02 DIAGNOSIS — N2889 Other specified disorders of kidney and ureter: Secondary | ICD-10-CM | POA: Diagnosis not present

## 2022-03-02 DIAGNOSIS — N1339 Other hydronephrosis: Secondary | ICD-10-CM | POA: Diagnosis not present

## 2022-03-02 DIAGNOSIS — R41841 Cognitive communication deficit: Secondary | ICD-10-CM | POA: Diagnosis not present

## 2022-03-02 MED ORDER — CEFDINIR 300 MG PO CAPS: 300.0000 mg | ORAL_CAPSULE | Freq: Two times a day (BID) | ORAL | Status: AC

## 2022-03-02 NOTE — TOC Progression Note (Signed)
Transition of Care [facility]) - Progression Note    Patient Details  Name: Calvin [name] [name]: [id number] Date of Birth: [birth year]  Transition of Care [facility]) CM/SW [place], RN Phone Number: 03/02/2022, 8:18 AM  Clinical Narrative:    CM spoke with Ebony Hail, [place] with [facility] rehabilitation and the patient insurance provider denied patient SNF placement and is offering peer-to-peer with the attending physician.  Contact information for peer-to-peer was given to Dr. Sarajane Jews this morning, if in agreement, to call [employer] at [phone] option 3 Ref # [id number].  CM will continue to follow the patient for discharge needs.   Expected Discharge Plan: [place] Barriers to Discharge: Continued Medical Work up  Expected Discharge Plan and Services Expected Discharge Plan: [place]   Discharge Planning Services: CM Consult Post Acute Care Choice: [place] Living arrangements for the past 2 months: Single Family Home                                       Social Determinants of Health (SDOH) Interventions    Readmission Risk Interventions    02/27/2022    2:30 PM  Readmission Risk Prevention Plan  Post Dischage Appt Complete  Medication Screening Complete  Transportation Screening Complete

## 2022-03-02 NOTE — Plan of Care (Signed)

## 2022-03-02 NOTE — Plan of Care (Signed)
  Problem: Education: Goal: Knowledge of General Education information will improve Description: Including pain rating scale, medication(s)/side effects and non-pharmacologic comfort measures 03/02/2022 0610 by Sharion Settler, RN Outcome: Progressing 03/02/2022 0610 by Sharion Settler, RN Outcome: Progressing   Problem: Health Behavior/Discharge Planning: Goal: Ability to manage health-related needs will improve 03/02/2022 0610 by Sharion Settler, RN Outcome: Progressing 03/02/2022 0610 by Sharion Settler, RN Outcome: Progressing   Problem: Clinical Measurements: Goal: Ability to maintain clinical measurements within normal limits will improve 03/02/2022 0610 by Sharion Settler, RN Outcome: Progressing 03/02/2022 0610 by Sharion Settler, RN Outcome: Progressing Goal: Will remain free from infection 03/02/2022 0610 by Sharion Settler, RN Outcome: Progressing 03/02/2022 0610 by Sharion Settler, RN Outcome: Progressing Goal: Diagnostic test results will improve 03/02/2022 0610 by Sharion Settler, RN Outcome: Progressing 03/02/2022 0610 by Sharion Settler, RN Outcome: Progressing Goal: Respiratory complications will improve 03/02/2022 0610 by Sharion Settler, RN Outcome: Progressing 03/02/2022 0610 by Sharion Settler, RN Outcome: Progressing Goal: Cardiovascular complication will be avoided 03/02/2022 0610 by Sharion Settler, RN Outcome: Progressing 03/02/2022 0610 by Sharion Settler, RN Outcome: Progressing   Problem: Activity: Goal: Risk for activity intolerance will decrease 03/02/2022 0610 by Sharion Settler, RN Outcome: Progressing 03/02/2022 0610 by Sharion Settler, RN Outcome: Progressing   Problem: Nutrition: Goal: Adequate nutrition will be maintained 03/02/2022 0610 by Sharion Settler, RN Outcome: Progressing 03/02/2022 0610 by Sharion Settler, RN Outcome: Progressing   Problem: Coping: Goal: Level  of anxiety will decrease 03/02/2022 0610 by Sharion Settler, RN Outcome: Progressing 03/02/2022 0610 by Sharion Settler, RN Outcome: Progressing   Problem: Elimination: Goal: Will not experience complications related to bowel motility 03/02/2022 0610 by Sharion Settler, RN Outcome: Progressing 03/02/2022 0610 by Sharion Settler, RN Outcome: Progressing Goal: Will not experience complications related to urinary retention 03/02/2022 0610 by Sharion Settler, RN Outcome: Progressing 03/02/2022 0610 by Sharion Settler, RN Outcome: Progressing   Problem: Pain Managment: Goal: General experience of comfort will improve 03/02/2022 0610 by Sharion Settler, RN Outcome: Progressing 03/02/2022 0610 by Sharion Settler, RN Outcome: Progressing   Problem: Safety: Goal: Ability to remain free from injury will improve 03/02/2022 0610 by Sharion Settler, RN Outcome: Progressing 03/02/2022 0610 by Sharion Settler, RN Outcome: Progressing   Problem: Skin Integrity: Goal: Risk for impaired skin integrity will decrease 03/02/2022 0610 by Sharion Settler, RN Outcome: Progressing 03/02/2022 0610 by Sharion Settler, RN Outcome: Progressing

## 2022-03-02 NOTE — Progress Notes (Signed)
Progress Note   Patient: Calvin [name] JOI:[id number] DOB: 1946/02/18 DOA: 02/24/2022     5 DOS: the patient was seen and examined on 03/02/2022   Brief hospital course: 76 year old man PMH including BPH presented with vomiting and diarrhea, bilateral flank pain.  CT showed enlarged prostate irregular causing mass effect on bladder with severe bladder distention and bilateral hydronephrosis with periureteral inflammation and fluid stranding with concern for malignant process.  Foley catheter placed in the emergency department with return of almost 5 L.  Admitted for AKI secondary to obstructive uropathy, bilateral acute pyelonephritis, viral enteritis.  Condition gradually improved with resolution of AKI and infection.  Seen by physical and Occupational Therapy with recommendation for SNF short-term.  Assessment and Plan: Acute kidney injury secondary to obstructive uropathy Bilateral hydronephrosis  Bilateral perinephric stranding with pyelonephritis -- CT showed bilateral hydronephrosis likely from bladder outlet obstruction from prostatic hypertrophy. --Foley catheter inserted in the ER with return of 4830m. Urology was consulted by EDP.  Will continue on discharge given anatomic abnormalities.  Close follow-up with urology as an outpatient. --Acute kidney injury resolved.  Baseline creatinine around 0.7-1 --Pyelonephritis appears clinically resolved.  Will complete course of oral antibiotics.  No cultures obtained.   Prostate enlargement with possible malignancy  --CT abdomen/pelvis showed heterogeneously enlarged prostate with irregular thickening mass effect on bladder base and extension into bladder wall.  -- Follow-up with urology as an outpatient.   Vomiting and diarrhea --Resolved.  Likely viral enteritis.   Hypomagnesemia -- Repleted  Hypokalemia --Resolved.   Aortic atherosclerosis --follow-up as an outpatient   Deconditioning. --Patient lives alone. PT OT recommended  SNF. -- Previously independent ambulating and driving, abrupt change in condition secondary to acute illness with multiple falls at home.  Currently requiring have a minimal assistance with therapy.  High fall risk as patient is going home with Foley catheter and having difficulty with short distances.  Insurance company refused SNF.  Peer to peer in process.  Subjective:  Feels ok Breathing ok Eating ok Lives alone, was ambulatory and driving before recent illness. Has had acute decline in function. Occasionally used a cane at home. Multiple falls at home recently  Physical Exam: Vitals:   03/01/22 0802 03/01/22 2124 03/02/22 0524 03/02/22 0921  BP: (!) 136/92 98/73 114/84 122/84  Pulse: 88 86 80 95  Resp: '16 18 20 18  '$ Temp: 98.6 F (37 C) 99 F (37.2 C) 98.2 F (36.8 C) 97.9 F (36.6 C)  TempSrc: Oral Oral  Oral  SpO2: 96% 95% 95% 95%  Weight:      Height:       Physical Exam Vitals reviewed.  Constitutional:      General: He is not in acute distress.    Appearance: He is not ill-appearing or toxic-appearing.  Cardiovascular:     Rate and Rhythm: Normal rate and regular rhythm.     Heart sounds: No murmur heard. Pulmonary:     Effort: Pulmonary effort is normal. No respiratory distress.     Breath sounds: No wheezing, rhonchi or rales.  Neurological:     Mental Status: He is alert.  Psychiatric:        Mood and Affect: Mood normal.        Behavior: Behavior normal.     Data Reviewed:  UOP 2450 No new labs  Family Communication: none  Disposition: Status is: Inpatient Remains inpatient appropriate because: awaiting SNF  Planned Discharge Destination: Skilled nursing facility    Time spent:  45 minutes  Author: Murray Hodgkins, MD 03/02/2022 11:20 AM  For on call review [email or web address].

## 2022-03-02 NOTE — Progress Notes (Signed)
Occupational Therapy Treatment Patient Details Name: Calvin [name] [name]: [id number] DOB: [birth year] Today's Date: 03/02/2022   History of present illness Patient is 76 y.o. male presented to hospital with complaints of flank pain and diarrhea found to have hypokalemia, bilateral hydronephrosis and suspicious malignant process in the prostate. PMH of BPH, HTN, anxiety, depression.   OT comments  Patient received in supine and agreeable to OT session. Patient able to get to EOB with min assist and min assist to stand from EOB. Patient able to stand at sink and perform grooming tasks with min guard assist. Patient performed functional mobility in hallway without seated rest break and returned to supine awaiting transportation for discharge. Patient would benefit from further OT services in SNF setting.    Recommendations for follow up therapy are one component of a multi-disciplinary discharge planning process, led by the attending physician.  Recommendations may be updated based on patient status, additional functional criteria and insurance authorization.    Follow Up Recommendations  Skilled nursing-short term rehab (<3 hours/day)    Assistance Recommended at Discharge Intermittent Supervision/Assistance  Patient can return home with the following  A little help with walking and/or transfers;A little help with bathing/dressing/bathroom;Assistance with cooking/housework;Help with stairs or ramp for entrance   Equipment Recommendations  BSC/3in1    Recommendations for Other Services      Precautions / Restrictions Precautions Precautions: Fall Precaution Comments: 1 every week for a couple months, in the last week pt reports he fell about 5 times Restrictions Weight Bearing Restrictions: No       Mobility Bed Mobility Overal bed mobility: Needs Assistance Bed Mobility: Supine to Sit, Sit to Supine     Supine to sit: HOB elevated, Min assist Sit to supine: Min guard   General  bed mobility comments: instructions on rail use and assistance with BLEs    Transfers Overall transfer level: Needs assistance Equipment used: Rolling walker (2 wheels) Transfers: Sit to/from Stand Sit to Stand: Min assist           General transfer comment: min assist to stand from raised bed, cues for hand placement     Balance Overall balance assessment: Needs assistance, History of Falls Sitting-balance support: Feet supported Sitting balance-Leahy Scale: Good     Standing balance support: Single extremity supported, Bilateral upper extremity supported, During functional activity Standing balance-Leahy Scale: Poor Standing balance comment: able to stand at sink with one extremity support                           ADL either performed or assessed with clinical judgement   ADL Overall ADL's : Needs assistance/impaired     Grooming: Wash/dry hands;Wash/dry face;Oral care;Min guard;Standing Grooming Details (indicate cue type and reason): standing at sink                               General ADL Comments: declined performing toilet transfers    Extremity/Trunk Assessment              Vision       Perception     Praxis      Cognition Arousal/Alertness: Awake/alert Behavior During Therapy: WFL for tasks assessed/performed Overall Cognitive Status: Within Functional Limits for tasks assessed  General Comments: hard of hearing. able to give strategies for fall prevention; ie. know limitations        Exercises      Shoulder Instructions       General Comments SpO2 and HR WFL on RA    Pertinent Vitals/ Pain       Pain Assessment Pain Assessment: Faces Faces Pain Scale: Hurts a little bit Pain Location: low back pain Pain Descriptors / Indicators: Discomfort, Grimacing Pain Intervention(s): Monitored during session, Repositioned  Home Living                                           Prior Functioning/Environment              Frequency  Min 2X/week        Progress Toward Goals  OT Goals(current goals can now be found in the care plan section)  Progress towards OT goals: Progressing toward goals  Acute Rehab OT Goals Patient Stated Goal: get better OT Goal Formulation: With patient Time For Goal Achievement: 03/13/22 Potential to Achieve Goals: Good ADL Goals Pt Will Perform Lower Body Bathing: with modified independence;sit to/from stand Pt Will Perform Lower Body Dressing: with modified independence;sit to/from stand Pt Will Transfer to Toilet: with modified independence;ambulating Pt Will Perform Toileting - Clothing Manipulation and hygiene: with modified independence Additional ADL Goal #1: Pt will independently verbalie 3 strategies to reduce risk of falls  Plan Discharge plan remains appropriate    Co-evaluation                 AM-PAC OT "6 Clicks" Daily Activity     Outcome Measure   Help from another person eating meals?: None Help from another person taking care of personal grooming?: A Little Help from another person toileting, which includes using toliet, bedpan, or urinal?: A Little Help from another person bathing (including washing, rinsing, drying)?: None Help from another person to put on and taking off regular upper body clothing?: None Help from another person to put on and taking off regular lower body clothing?: None 6 Click Score: 22    End of Session Equipment Utilized During Treatment: Gait belt;Rolling walker (2 wheels)  OT Visit Diagnosis: Unsteadiness on feet (R26.81);Other abnormalities of gait and mobility (R26.89);Repeated falls (R29.6);Muscle weakness (generalized) (M62.81)   Activity Tolerance Patient tolerated treatment well   Patient Left in bed;with call bell/phone within reach;with bed alarm set   Nurse Communication Mobility status        Time: 8676-1950 OT Time  Calculation (min): 23 min  Charges: OT General Charges $OT Visit: 1 Visit OT Treatments $Self Care/Home Management : 23-37 mins  Lodema Hong, [place]  Office [place] 03/02/2022, 2:24 PM

## 2022-03-02 NOTE — Progress Notes (Addendum)
Mobility Specialist Progress Note:   03/02/22 1125  Mobility  Activity Ambulated with assistance in room;Transferred to/from [facility]  Level of Assistance Minimal assist, patient does 75% or more  Assistive Device Front wheel walker  Distance Ambulated (ft) 30 ft  Activity Response Tolerated fair  $Mobility charge 1 Mobility   Pt received in bed and agreeable. C/o of feeling "wobbly". Pt was slightly unsteady on feet, requiring support from gait belt to stay balanced. No complaints of pain or SOB. Pt left on BSC with call bell in reach. Will f/u as needed.   Kenyanna Grzesiak Mobility Specialist-Acute Rehab Secure Chat only

## 2022-03-02 NOTE — TOC Transition Note (Addendum)
Transition of Care [facility]) - CM/SW Discharge Note   Patient Details  Name: Calvin [name] [name]: [id number] Date of Birth: 19-Mar-1946  [facility]) CM/SW Contact:  Curlene Labrum, RN Phone Number: 03/02/2022, 12:14 PM   Clinical Narrative:    Dr. Sarajane Jews completed Peer-to-Peer with Bernadene Person MD and the patient was approved for [facility] placement at [facility] rehabilitation.  I spoke with Ebony Hail, [place] at [place] rehabilitation and they have a bed available for the patient to transfer today.  I updated the attending physician and discharge orders and summary will be completed.    PTAR transportation will be coordinated.  Bedside nursing - Please call report to [facility] at [phone] - Room 506 B.   Final next level of care: [place] Barriers to Discharge: Continued Medical Work up   Patient Goals and CMS Choice Patient states their goals for this hospitalization and ongoing recovery are:: To get better and go to Rehab CMS Medicare.gov Compare Post Acute Care list provided to:: Patient Choice offered to / list presented to : Patient  Discharge Placement                       Discharge Plan and Services   Discharge Planning Services: CM Consult Post Acute Care Choice: [place]                               Social Determinants of Health (SDOH) Interventions     Readmission Risk Interventions    02/27/2022    2:30 PM  Readmission Risk Prevention Plan  Post Dischage Appt Complete  Medication Screening Complete  Transportation Screening Complete

## 2022-03-02 NOTE — Discharge Summary (Addendum)
Physician Discharge Summary   Patient: Calvin [name] [name]: [id number] DOB: 26-Jun-1945  Admit date:     02/24/2022  Discharge date: 03/02/22  Discharge Physician: Murray Hodgkins   PCP: Laurey Morale, MD   Recommendations at discharge:   Acute kidney injury secondary to obstructive uropathy, acute urinary retention Bilateral hydronephrosis  Bilateral perinephric stranding with pyelonephritis -- CT showed bilateral hydronephrosis likely from bladder outlet obstruction from prostatic hypertrophy and possible mass --Foley catheter inserted in the ER with return of 4855m. Urology was consulted by EDP.  Will continue on discharge given anatomic abnormalities.  Close follow-up with urology as an outpatient.   Prostate enlargement with possible malignancy  --CT abdomen/pelvis showed heterogeneously enlarged prostate with irregular thickening mass effect on bladder base and possible extension into bladder wall.  -- Follow-up with urology as an outpatient in near future for urinary retention and possible prostate malignancy   Discharge Diagnoses: Principal Problem:   Pyelonephritis Active Problems:   Acute pyelonephritis   AKI (acute kidney injury) ([place] Obstructive uropathy Bilateral hydronephrosis  Acute urinary retention Bilateral perinephric stranding  Prostate enlargement with possible malignancy  Vomiting and diarrhea Hypomagnesemia Hypokalemia Aortic atherosclerosis  Deconditioning.  Hospital Course: 76year old man PMH including BPH presented with vomiting and diarrhea, bilateral flank pain.  CT showed enlarged prostate irregular causing mass effect on bladder with severe bladder distention and bilateral hydronephrosis with periureteral inflammation and fluid stranding with concern for malignant process.  Foley catheter placed in the emergency department with return of almost 5 L.  Admitted for AKI secondary to obstructive uropathy, bilateral acute pyelonephritis, viral  enteritis.  Condition gradually improved with resolution of AKI and infection.  Seen by physical and Occupational Therapy with recommendation for SNF short-term.  Acute kidney injury secondary to obstructive uropathy Bilateral hydronephrosis  Acute urinary retention Bilateral perinephric stranding with pyelonephritis -- CT showed bilateral hydronephrosis likely from bladder outlet obstruction from prostatic hypertrophy. --Foley catheter inserted in the ER with return of 48079m Urology was consulted by EDP.  Will continue on discharge given anatomic abnormalities.  Close follow-up with urology as an outpatient. --Acute kidney injury resolved.  Baseline creatinine around 0.7-1 --Pyelonephritis appears clinically resolved.  Will complete course of oral antibiotics.  No cultures obtained.   Prostate enlargement with possible malignancy  --CT abdomen/pelvis showed heterogeneously enlarged prostate with irregular thickening mass effect on bladder base and possible extension into bladder wall.  -- Follow-up with urology as an outpatient.   Vomiting and diarrhea --Resolved.  Likely viral enteritis.   Hypomagnesemia -- Repleted   Hypokalemia --Resolved.   Aortic atherosclerosis --follow-up as an outpatient   Deconditioning. --Patient lives alone. PT OT recommended SNF. -- Previously independent ambulating and driving, abrupt change in condition secondary to acute illness with multiple falls at home.  Currently requiring heavy minimal assistance with therapy.  High fall risk as patient is going home with Foley catheter and having difficulty with short distances. Peer to peer completed by wrProbation officeroday with approval.      Consultants: None Procedures performed: foley insertion  Disposition: Skilled nursing facility Diet recommendation:  Regular diet DISCHARGE MEDICATION: Allergies as of 03/02/2022   No Known Allergies      Medication List     STOP taking these medications     cyclobenzaprine 10 MG tablet Commonly known as: FLEXERIL   potassium chloride 10 MEQ tablet Commonly known as: Klor-Con 10       TAKE these medications    amLODipine 5 MG tablet  Commonly known as: NORVASC Take 1 tablet (5 mg total) by mouth daily.   ascorbic acid 500 MG tablet Commonly known as: VITAMIN C Take 500 mg by mouth daily.   atorvastatin 20 MG tablet Commonly known as: LIPITOR Take 20 mg by mouth daily.   BD Eclipse Syringe/Needle 25G X 5/8" 3 ML Misc Generic drug: SYRINGE-NEEDLE (DISP) 3 ML Use as directed once a week   cefdinir 300 MG capsule Commonly known as: OMNICEF Take 1 capsule (300 mg total) by mouth every 12 (twelve) hours for 5 days. Completes therapy 9/20   cholecalciferol 25 MCG (1000 UNIT) tablet Commonly known as: VITAMIN D3 Take 1,000 Units by mouth daily.   cyanocobalamin 1000 MCG/ML injection Commonly known as: VITAMIN B12 Inject 1 mL (1,000 mcg total) into the muscle once a week.   diazepam 5 MG tablet Commonly known as: VALIUM Take 1 or 2 tablets about 30 minutes before procedures such as MRI scans   finasteride 5 MG tablet Commonly known as: PROSCAR Take 1 tablet (5 mg total) by mouth daily.   hydrochlorothiazide 25 MG tablet Commonly known as: HYDRODIURIL TAKE 1 TABLET BY MOUTH EVERY DAY   losartan 100 MG tablet Commonly known as: COZAAR Take 1 tablet (100 mg total) by mouth daily.   potassium chloride 10 MEQ tablet Commonly known as: KLOR-CON M Take 1 tablet (10 mEq total) by mouth 2 (two) times daily.   sertraline 100 MG tablet Commonly known as: ZOLOFT TAKE 1 TABLET BY MOUTH EVERY DAY   tamsulosin 0.4 MG Caps capsule Commonly known as: FLOMAX Take 1 capsule (0.4 mg total) by mouth daily.   zinc gluconate 50 MG tablet Take 50 mg by mouth daily.        Contact information for follow-up providers     [facility]. Schedule an appointment as soon as possible for a visit .   Contact  information: [place]        Laurey Morale, MD .   Specialty: Family Medicine Contact information: [place] [address] [phone]              Contact information for after-discharge care     [facility] .   Service: Skilled Nursing Contact information: [address] [place] [phone]                    Discharge Exam: Danley Danker Weights   02/25/22 1510 02/25/22 1621 02/25/22 1633  Weight: 99.2 kg 99.2 kg 99.2 kg   See progress note same day  Condition at discharge: good  The results of significant diagnostics from this hospitalization (including imaging, microbiology, ancillary and laboratory) are listed below for reference.   Imaging Studies: CT ABDOMEN PELVIS W CONTRAST  Result Date: 02/25/2022 CLINICAL DATA:  Diarrhea. Abdominal mass/hernia. Anion gap metabolic acidosis. Diarrhea with nausea onset last week. Emesis today and mid upper abdominal pain. EXAM: CT ABDOMEN AND PELVIS WITH CONTRAST TECHNIQUE: Multidetector CT imaging of the abdomen and pelvis was performed using the standard protocol following bolus administration of intravenous contrast. RADIATION DOSE REDUCTION: This exam was performed according to the departmental dose-optimization program which includes automated exposure control, adjustment of the mA and/or kV according to patient size and/or use of iterative reconstruction technique. CONTRAST:  60m OMNIPAQUE IOHEXOL 350 MG/ML SOLN COMPARISON:  CT abdomen dated 01/19/2016. FINDINGS: Lower  chest: Mild scarring/atelectasis at the lung bases. No acute findings. Hepatobiliary: Several hepatic cysts. No follow-up imaging is recommended for this benign finding. No acute or suspicious findings within the liver. Gallbladder is unremarkable. No bile duct dilatation is seen. Pancreas:  Unremarkable. No pancreatic ductal dilatation or surrounding inflammatory changes. Spleen: Normal in size without focal abnormality. Adrenals/Urinary Tract: Bladder is markedly distended. Prostate gland is enlarged, with the irregular thickening anteriorly, causing mass effect on the bladder base and possible extension into the bladder wall. Associated bilateral hydronephrosis, moderate to severe in degree, RIGHT greater than LEFT. Associated perinephric and periureteral inflammation/fluid stranding. No renal or ureteral calculi. LEFT renal cysts. No follow-up imaging is recommended for this benign finding. Stomach/Bowel: No dilated large or small bowel loops. No evidence of bowel wall inflammation. Appendix is not seen but there are no inflammatory changes about the cecum to suggest acute appendicitis. Stomach is unremarkable, partially decompressed. Vascular/Lymphatic: Aortic atherosclerosis. No abdominal aortic aneurysm. No acute-appearing vascular abnormality. No enlarged lymph nodes are seen in the abdomen or pelvis. Reproductive: Prostate gland is heterogeneously enlarged, as above. Can not exclude prostate extension to the overlying posterior-inferior bladder wall. Other: No abscess collection is seen.  No free intraperitoneal air. Musculoskeletal: No acute or suspicious osseous abnormality. Degenerative spondylosis scattered throughout the thoracolumbar spine, mild to moderate in degree. IMPRESSION: 1. Bladder is markedly distended. 2. Prostate gland is heterogeneously enlarged, with irregular thickening anteriorly, causing mass effect on the bladder base and possible extension into the bladder wall, and causing the severe bladder distention. 3. Associated bilateral hydronephrosis, moderate to severe in degree, RIGHT greater than LEFT, with associated perinephric and periureteral inflammation/fluid stranding. Findings are highly suspicious for a neoplastic process. Again, can not exclude prostate neoplasm  with extension to the inferior bladder wall. Recommend Urology consultation for further workup considerations. Aortic Atherosclerosis (ICD10-I70.0). Electronically Signed   By: Franki Cabot M.D.   On: 02/25/2022 10:08    Microbiology: Results for orders placed or performed during the hospital encounter of 02/24/22  C Difficile Quick Screen w PCR reflex     Status: None   Collection Time: 02/26/22  6:37 PM   Specimen: STOOL  Result Value Ref Range Status   C Diff antigen NEGATIVE NEGATIVE Final   C Diff toxin NEGATIVE NEGATIVE Final   C Diff interpretation No C. difficile detected.  Final    Comment: Performed at [facility], [place] [address] [place], [place] [address]    Labs: CBC: Recent Labs  Lab 02/24/22 2018 02/26/22 0900 02/27/22 0329 02/28/22 0405 03/01/22 0543  WBC 13.6* 8.2 6.9 7.5 7.5  NEUTROABS 11.0*  --   --   --   --   HGB 16.0 13.6 12.2* 13.1 14.4  HCT 43.1 38.0* 34.3* 35.6* 40.8  MCV 94.7 97.2 96.3 94.2 95.3  PLT 282 220 217 241 244   Basic Metabolic Panel: Recent Labs  Lab 02/26/22 1445 02/27/22 0329 02/27/22 1449 02/28/22 0405 03/01/22 0543  NA 137 136 135 134* 135  K 3.0* 3.0* 3.9 3.7 3.6  CL 105 105 103 103 102  CO2 '25 24 25 23 25  '$ GLUCOSE 98 98 92 96 102*  BUN '21 19 14 14 15  '$ CREATININE 1.13 1.00 1.05 0.86 1.09  CALCIUM 8.2* 8.2* 8.3* 8.1* 8.6*  MG  --  1.2* 1.8 1.4* 1.6*   Liver Function Tests: Recent Labs  Lab 02/24/22 2018  AST 21  ALT 24  ALKPHOS 127*  BILITOT 1.9*  PROT 7.4  ALBUMIN 3.8   CBG: No results for input(s): "GLUCAP" in the last 168 hours.  Discharge time spent: greater than 30 minutes.  Signed: Murray Hodgkins, MD [facility] 03/02/2022

## 2022-03-02 NOTE — Progress Notes (Signed)
Physical Therapy Treatment Patient Details Name: Calvin [name] [name]: [id number] DOB: 07/30/1945 Today's Date: 03/02/2022   History of Present Illness Patient is 76 y.o. male presented to hospital with complaints of flank pain and diarrhea found to have hypokalemia, bilateral hydronephrosis and suspicious malignant process in the prostate. PMH of BPH, HTN, anxiety, depression.    PT Comments    Pt received in recliner, recently done with eating lunch and agreeable to therapy session with emphasis on therapeutic exercises for strengthening, pt performed therex in chair, standing at RW and in supine, pt with good effort throughout. Pt needing mod safety cues and consistent minA lift assist for transfers from chair and bed heights to RW and minA with assist to manage RW, with increased cues for safety with backward stepping. Gait distance limited due to pt fatigue after recently working with mobility specialist and with PTA on exercises. Pt agreeable to sit up in bed in chair posture as he may be DC soon. Pt continues to benefit from PT services to progress toward functional mobility goals.    Recommendations for follow up therapy are one component of a multi-disciplinary discharge planning process, led by the attending physician.  Recommendations may be updated based on patient status, additional functional criteria and insurance authorization.  Follow Up Recommendations  Skilled nursing-short term rehab (<3 hours/day) Can patient physically be transported by private vehicle: No (no family available to transport pt)   Assistance Recommended at Discharge Intermittent Supervision/Assistance  Patient can return home with the following A little help with walking and/or transfers;A little help with bathing/dressing/bathroom;Assistance with cooking/housework;Direct supervision/assist for medications management;Assist for transportation;Help with stairs or ramp for entrance   Equipment Recommendations   Rolling walker (2 wheels)    Recommendations for Other Services       Precautions / Restrictions Precautions Precautions: Fall Precaution Comments: 1 every week for a couple months, in the last week pt reports he fell about 5 times Restrictions Weight Bearing Restrictions: No     Mobility  Bed Mobility Overal bed mobility: Needs Assistance Bed Mobility: Sit to Supine       Sit to supine: Min guard   General bed mobility comments: cues for body mechanics/positioning in bed needed, pt using bed rail    Transfers Overall transfer level: Needs assistance Equipment used: Rolling walker (2 wheels) Transfers: Sit to/from Stand Sit to Stand: Min assist           General transfer comment: min assist to rise from low EOB and chair heights, pt required cues for hand placement/not to pull up on RW; Pt stands better with use of momentum to achieve upright, otherwise needs nearly modA to stand. STS x 3 from chair and x1 from EOB    Ambulation/Gait Ambulation/Gait assistance: Min assist Gait Distance (Feet): 15 Feet Assistive device: Rolling walker (2 wheels) Gait Pattern/deviations: Decreased stride length, Decreased step length - right, Decreased stance time - left, Shuffle, Step-to pattern Gait velocity: <0.2 m/s     General Gait Details: pt needs manual assist to manage RW with backward stepping and maintains downward gaze. small shuffling steps with RW but able to perform standing hip flexion exercise and hamstring curls with good ROM when cued.       Balance Overall balance assessment: Needs assistance, History of Falls Sitting-balance support: Feet supported Sitting balance-Leahy Scale: Good     Standing balance support: Reliant on assistive device for balance, Bilateral upper extremity supported, During functional activity Standing balance-Leahy Scale: Poor  Cognition Arousal/Alertness: Awake/alert Behavior During  Therapy: WFL for tasks assessed/performed Overall Cognitive Status: Within Functional Limits for tasks assessed                                 General Comments: slow processing, hard of hearing; fair insight into deficits.        Exercises General Exercises - Upper Extremity Chair Push Up: AROM, Both, 10 reps, Seated (pt using BLE and BUE engaged to perform) Other Exercises Other Exercises: seated BLE AROM: Hip flexion, LAQ x10 reps ea Other Exercises: standing BLE AROM: hip flexion, heel raises, hamstring curls x10 reps ea Other Exercises: supine bridges x5 reps and posterior pelvic tilts x5 reps    General Comments General comments (skin integrity, edema, etc.): SpO2 and HR WFL on RA      Pertinent Vitals/Pain Pain Assessment Pain Assessment: Faces Faces Pain Scale: Hurts little more Pain Location: low back pain (per pt it is not new) Pain Descriptors / Indicators: Discomfort, Grimacing Pain Intervention(s): Monitored during session, Limited activity within patient's tolerance, Repositioned     PT Goals (current goals can now be found in the care plan section) Acute Rehab PT Goals Patient Stated Goal: get stronger, improve balance, stop falling, go back to the gym if I am able to PT Goal Formulation: With patient Time For Goal Achievement: 03/13/22 Progress towards PT goals: Progressing toward goals    Frequency    Min 3X/week      PT Plan Current plan remains appropriate       AM-PAC PT "6 Clicks" Mobility   Outcome Measure  Help needed turning from your back to your side while in a flat bed without using bedrails?: A Little Help needed moving from lying on your back to sitting on the side of a flat bed without using bedrails?: A Little Help needed moving to and from a bed to a chair (including a wheelchair)?: A Little Help needed standing up from a chair using your arms (e.g., wheelchair or bedside chair)?: A Lot (mod safety cues for this and  all items below) Help needed to walk in hospital room?: A Lot Help needed climbing 3-5 steps with a railing? : A Lot 6 Click Score: 15    End of Session Equipment Utilized During Treatment: Gait belt Activity Tolerance: Patient tolerated treatment well Patient left: with call bell/phone within reach;in bed;with bed alarm set;Other (comment) (bed in chair posture) Nurse Communication: Mobility status PT Visit Diagnosis: Muscle weakness (generalized) (M62.81);Difficulty in walking, not elsewhere classified (R26.2);Unsteadiness on feet (R26.81);History of falling (Z91.81)     Time: 0102-7253 PT Time Calculation (min) (ACUTE ONLY): 24 min  Charges:  $Therapeutic Exercise: 8-22 mins $Therapeutic Activity: 8-22 mins                     Mayla Biddy P., PTA Acute Rehabilitation Services Secure Chat Preferred 9a-5:30pm Office: [place] 03/02/2022, 1:50 PM

## 2022-03-02 NOTE — Progress Notes (Signed)
Mobility Specialist Progress Note:   03/02/22 1149  Mobility  Activity Ambulated with assistance in room;Transferred to/from BSC  Level of Assistance Minimal assist, patient does 75% or more  Assistive Device Front wheel walker  Distance Ambulated (ft) 5 ft  Activity Response Tolerated fair  $Mobility charge 1 Mobility   Pt received on BSC and requesting assistance getting to chair. C/o of feeling "wobbly". No complaints of pain or SOB. Pt left in chair with all needs met and call bell in reach.     Mobility Specialist-Acute Rehab Secure Chat only  

## 2022-03-05 ENCOUNTER — Telehealth: Payer: Self-pay | Admitting: Family Medicine

## 2022-03-05 ENCOUNTER — Ambulatory Visit: Payer: Medicare HMO | Admitting: Physical Therapy

## 2022-03-05 ENCOUNTER — Telehealth: Payer: Self-pay | Admitting: Neurology

## 2022-03-05 DIAGNOSIS — R531 Weakness: Secondary | ICD-10-CM | POA: Diagnosis not present

## 2022-03-05 DIAGNOSIS — G20 Parkinson's disease: Secondary | ICD-10-CM | POA: Diagnosis not present

## 2022-03-05 DIAGNOSIS — R262 Difficulty in walking, not elsewhere classified: Secondary | ICD-10-CM | POA: Diagnosis not present

## 2022-03-05 NOTE — Telephone Encounter (Signed)
Patient's daughter Lorriane Shire called for results from DAT scan.

## 2022-03-05 NOTE — Telephone Encounter (Signed)
Called and spoke to Dr. Leonia Reeves and he said it was not in his work que and he would do some research and get it read right away  Called daughter and left message that we would call her as soon as possible with those results

## 2022-03-05 NOTE — Telephone Encounter (Signed)
Left message for patient to call back and schedule Medicare Annual Wellness Visit (AWV) either virtually or in office. Left  my Herbie Drape number [phone]   Last AWV ;03/08/21  please schedule at anytime with [facility] Nurse Health Advisor 1 or 2

## 2022-03-06 DIAGNOSIS — R339 Retention of urine, unspecified: Secondary | ICD-10-CM | POA: Diagnosis not present

## 2022-03-06 DIAGNOSIS — N139 Obstructive and reflux uropathy, unspecified: Secondary | ICD-10-CM | POA: Diagnosis not present

## 2022-03-06 DIAGNOSIS — R5381 Other malaise: Secondary | ICD-10-CM | POA: Diagnosis not present

## 2022-03-06 NOTE — Telephone Encounter (Signed)
Called daughter on 03-05-22 to let her know the Dr was reading the Dat Scan and I did get those results and called daughter to give her those results. Unable to reach the daughter both times and left voicemail message

## 2022-03-07 NOTE — Telephone Encounter (Signed)
Called patients daughter again and left voicemail. I will be sending a my chart message as well

## 2022-03-09 ENCOUNTER — Ambulatory Visit: Payer: Medicare HMO | Admitting: Family Medicine

## 2022-03-09 DIAGNOSIS — N401 Enlarged prostate with lower urinary tract symptoms: Secondary | ICD-10-CM | POA: Diagnosis not present

## 2022-03-09 DIAGNOSIS — R5381 Other malaise: Secondary | ICD-10-CM | POA: Diagnosis not present

## 2022-03-09 DIAGNOSIS — N429 Disorder of prostate, unspecified: Secondary | ICD-10-CM | POA: Diagnosis not present

## 2022-03-09 DIAGNOSIS — N3281 Overactive bladder: Secondary | ICD-10-CM | POA: Diagnosis not present

## 2022-03-09 DIAGNOSIS — N1339 Other hydronephrosis: Secondary | ICD-10-CM | POA: Diagnosis not present

## 2022-03-09 DIAGNOSIS — I700 Atherosclerosis of aorta: Secondary | ICD-10-CM | POA: Diagnosis not present

## 2022-03-09 DIAGNOSIS — R69 Illness, unspecified: Secondary | ICD-10-CM | POA: Diagnosis not present

## 2022-03-09 DIAGNOSIS — N2889 Other specified disorders of kidney and ureter: Secondary | ICD-10-CM | POA: Diagnosis not present

## 2022-03-09 DIAGNOSIS — N10 Acute tubulo-interstitial nephritis: Secondary | ICD-10-CM | POA: Diagnosis not present

## 2022-03-09 DIAGNOSIS — N139 Obstructive and reflux uropathy, unspecified: Secondary | ICD-10-CM | POA: Diagnosis not present

## 2022-03-12 ENCOUNTER — Other Ambulatory Visit: Payer: Self-pay | Admitting: Family Medicine

## 2022-03-12 ENCOUNTER — Ambulatory Visit: Payer: Medicare HMO | Admitting: Physical Therapy

## 2022-03-12 DIAGNOSIS — N2889 Other specified disorders of kidney and ureter: Secondary | ICD-10-CM | POA: Diagnosis not present

## 2022-03-12 DIAGNOSIS — I700 Atherosclerosis of aorta: Secondary | ICD-10-CM | POA: Diagnosis not present

## 2022-03-12 DIAGNOSIS — N139 Obstructive and reflux uropathy, unspecified: Secondary | ICD-10-CM | POA: Diagnosis not present

## 2022-03-12 DIAGNOSIS — N401 Enlarged prostate with lower urinary tract symptoms: Secondary | ICD-10-CM | POA: Diagnosis not present

## 2022-03-12 DIAGNOSIS — R69 Illness, unspecified: Secondary | ICD-10-CM | POA: Diagnosis not present

## 2022-03-12 DIAGNOSIS — N1339 Other hydronephrosis: Secondary | ICD-10-CM | POA: Diagnosis not present

## 2022-03-12 DIAGNOSIS — N3281 Overactive bladder: Secondary | ICD-10-CM | POA: Diagnosis not present

## 2022-03-12 DIAGNOSIS — N429 Disorder of prostate, unspecified: Secondary | ICD-10-CM | POA: Diagnosis not present

## 2022-03-12 DIAGNOSIS — R5381 Other malaise: Secondary | ICD-10-CM | POA: Diagnosis not present

## 2022-03-12 DIAGNOSIS — N10 Acute tubulo-interstitial nephritis: Secondary | ICD-10-CM | POA: Diagnosis not present

## 2022-03-12 DIAGNOSIS — R339 Retention of urine, unspecified: Secondary | ICD-10-CM | POA: Diagnosis not present

## 2022-03-13 DIAGNOSIS — N401 Enlarged prostate with lower urinary tract symptoms: Secondary | ICD-10-CM | POA: Diagnosis not present

## 2022-03-13 DIAGNOSIS — R338 Other retention of urine: Secondary | ICD-10-CM | POA: Diagnosis not present

## 2022-03-14 ENCOUNTER — Ambulatory Visit: Payer: Medicare HMO | Admitting: Family Medicine

## 2022-03-14 ENCOUNTER — Ambulatory Visit (INDEPENDENT_AMBULATORY_CARE_PROVIDER_SITE_OTHER): Payer: Medicare HMO | Admitting: Family Medicine

## 2022-03-14 ENCOUNTER — Encounter: Payer: Self-pay | Admitting: Family Medicine

## 2022-03-14 VITALS — BP 98/74 | HR 105 | Temp 98.8°F | Wt 221.0 lb

## 2022-03-14 DIAGNOSIS — I10 Essential (primary) hypertension: Secondary | ICD-10-CM

## 2022-03-14 DIAGNOSIS — N401 Enlarged prostate with lower urinary tract symptoms: Secondary | ICD-10-CM | POA: Diagnosis not present

## 2022-03-14 DIAGNOSIS — N10 Acute tubulo-interstitial nephritis: Secondary | ICD-10-CM | POA: Diagnosis not present

## 2022-03-14 DIAGNOSIS — N179 Acute kidney failure, unspecified: Secondary | ICD-10-CM

## 2022-03-14 DIAGNOSIS — N138 Other obstructive and reflux uropathy: Secondary | ICD-10-CM

## 2022-03-14 NOTE — Progress Notes (Signed)
   Subjective:    Patient ID: Calvin [name], male    DOB: [birth year], 76 y.o.   [name]: [id number]  HPI Here with his wife to follow up on a hospital stay from 02-24-22 to 03-02-22, and then a rehab stay at [facility] until yesterday. He was admitted with abdominal pain and diarrhea. He was found to have a severely enlarged bladder and bilateral hydroureters and bilateral hydronephrosis due to bladder outlet obstruction. The initial catheterization drained 4800 cc of urine. He has had an indwelling catheter ever since, he had AKI at first, but his creatinine went from 2.27 at admission to 1.09 at DC. His WBC went from 13.6 to 7.5. A CT scan revealed a large prostate with an area of increased irregular thickening anteriorly. This was pushing on the bladder and possibly extending into the bladder wall. This was felt to likely indicate a prostate cancer. Urology was consulted and they said to leave the Foley catheter in place for at least 4 weeks. Today he feels weak but he has no abdominal pain. His appetite is poor. He eats 2 small microwave meals a day. His bowels are moving well.    Review of Systems  Constitutional:  Positive for fatigue.  Respiratory: Negative.    Cardiovascular: Negative.   Gastrointestinal: Negative.   Genitourinary:  Positive for difficulty urinating. Negative for flank pain.  Neurological:  Positive for weakness.       Objective:   Physical Exam Constitutional:      Appearance: He is not ill-appearing.     Comments: In a wheelchair, Foley in place   Cardiovascular:     Rate and Rhythm: Normal rate and regular rhythm.     Pulses: Normal pulses.     Heart sounds: Normal heart sounds.  Pulmonary:     Effort: Pulmonary effort is normal.     Breath sounds: Normal breath sounds.  Abdominal:     General: Abdomen is flat. Bowel sounds are normal. There is no distension.     Palpations: Abdomen is soft. There is no mass.     Tenderness: There is no abdominal  tenderness. There is no right CVA tenderness, guarding or rebound.     Hernia: No hernia is present.  Neurological:     General: No focal deficit present.     Mental Status: He is alert and oriented to person, place, and time.           Assessment & Plan:  He is recovering from a severe bladder outlet obstruction that caused bilateral hydronephroses and AKI. He has a Foley in place that is draining well. He is scheduled to see Dr. Jacalyn Lefevre at [facility] on 04-20-22 to address the BOO as well as the prostatic mass seen on the CT scan. I encouraged Bill to drink 2 Boost protein shakes a day and to drink plenty of water in addition to his microwave meals. We will check a BMET today for the renal function as well as a PSA. He is expecting to begin PT at home in the next day or two. We spent a total of ( 34  ) minutes reviewing records and discussing these issues.  Alysia Penna, MD

## 2022-03-15 DIAGNOSIS — R69 Illness, unspecified: Secondary | ICD-10-CM | POA: Diagnosis not present

## 2022-03-15 DIAGNOSIS — N10 Acute tubulo-interstitial nephritis: Secondary | ICD-10-CM | POA: Diagnosis not present

## 2022-03-15 DIAGNOSIS — F1010 Alcohol abuse, uncomplicated: Secondary | ICD-10-CM | POA: Diagnosis not present

## 2022-03-15 DIAGNOSIS — R262 Difficulty in walking, not elsewhere classified: Secondary | ICD-10-CM | POA: Diagnosis not present

## 2022-03-15 DIAGNOSIS — D519 Vitamin B12 deficiency anemia, unspecified: Secondary | ICD-10-CM | POA: Diagnosis not present

## 2022-03-15 DIAGNOSIS — H9190 Unspecified hearing loss, unspecified ear: Secondary | ICD-10-CM | POA: Diagnosis not present

## 2022-03-15 DIAGNOSIS — E785 Hyperlipidemia, unspecified: Secondary | ICD-10-CM | POA: Diagnosis not present

## 2022-03-15 DIAGNOSIS — Z9181 History of falling: Secondary | ICD-10-CM | POA: Diagnosis not present

## 2022-03-15 DIAGNOSIS — N136 Pyonephrosis: Secondary | ICD-10-CM | POA: Diagnosis not present

## 2022-03-15 DIAGNOSIS — R338 Other retention of urine: Secondary | ICD-10-CM | POA: Diagnosis not present

## 2022-03-15 DIAGNOSIS — S82892D Other fracture of left lower leg, subsequent encounter for closed fracture with routine healing: Secondary | ICD-10-CM | POA: Diagnosis not present

## 2022-03-15 DIAGNOSIS — I700 Atherosclerosis of aorta: Secondary | ICD-10-CM | POA: Diagnosis not present

## 2022-03-15 DIAGNOSIS — I10 Essential (primary) hypertension: Secondary | ICD-10-CM | POA: Diagnosis not present

## 2022-03-15 DIAGNOSIS — N401 Enlarged prostate with lower urinary tract symptoms: Secondary | ICD-10-CM | POA: Diagnosis not present

## 2022-03-15 DIAGNOSIS — S82832D Other fracture of upper and lower end of left fibula, subsequent encounter for closed fracture with routine healing: Secondary | ICD-10-CM | POA: Diagnosis not present

## 2022-03-15 DIAGNOSIS — N3281 Overactive bladder: Secondary | ICD-10-CM | POA: Diagnosis not present

## 2022-03-15 DIAGNOSIS — N320 Bladder-neck obstruction: Secondary | ICD-10-CM | POA: Diagnosis not present

## 2022-03-15 DIAGNOSIS — Z466 Encounter for fitting and adjustment of urinary device: Secondary | ICD-10-CM | POA: Diagnosis not present

## 2022-03-15 LAB — BASIC METABOLIC PANEL
BUN: 20 mg/dL (ref 6–23)
CO2: 25 mEq/L (ref 19–32)
Calcium: 9.7 mg/dL (ref 8.4–10.5)
Chloride: 100 mEq/L (ref 96–112)
Creatinine, Ser: 0.98 mg/dL (ref 0.40–1.50)
GFR: 75.87 mL/min (ref >60.00)
Glucose, Bld: 95 mg/dL (ref 70–99)
Potassium: 4.2 mEq/L (ref 3.5–5.1)
Sodium: 136 mEq/L (ref 135–145)

## 2022-03-15 LAB — PSA: PSA: 0.77 ng/mL (ref 0.10–4.00)

## 2022-03-17 ENCOUNTER — Other Ambulatory Visit: Payer: Self-pay

## 2022-03-17 ENCOUNTER — Inpatient Hospital Stay (HOSPITAL_COMMUNITY)
Admission: EM | Admit: 2022-03-17 | Discharge: 2022-03-22 | DRG: 699 | Disposition: A | Discharge status: Skilled Nursing Facility | Payer: Medicare HMO | Attending: Internal Medicine | Admitting: Internal Medicine

## 2022-03-17 DIAGNOSIS — T83511A Infection and inflammatory reaction due to indwelling urethral catheter, initial encounter: Principal | ICD-10-CM | POA: Diagnosis present

## 2022-03-17 DIAGNOSIS — Z8249 Family history of ischemic heart disease and other diseases of the circulatory system: Secondary | ICD-10-CM

## 2022-03-17 DIAGNOSIS — H9193 Unspecified hearing loss, bilateral: Secondary | ICD-10-CM | POA: Diagnosis present

## 2022-03-17 DIAGNOSIS — E871 Hypo-osmolality and hyponatremia: Secondary | ICD-10-CM | POA: Diagnosis present

## 2022-03-17 DIAGNOSIS — I10 Essential (primary) hypertension: Secondary | ICD-10-CM | POA: Diagnosis not present

## 2022-03-17 DIAGNOSIS — Z823 Family history of stroke: Secondary | ICD-10-CM

## 2022-03-17 DIAGNOSIS — E872 Acidosis, unspecified: Secondary | ICD-10-CM | POA: Diagnosis present

## 2022-03-17 DIAGNOSIS — N320 Bladder-neck obstruction: Secondary | ICD-10-CM | POA: Diagnosis present

## 2022-03-17 DIAGNOSIS — R194 Change in bowel habit: Secondary | ICD-10-CM | POA: Diagnosis present

## 2022-03-17 DIAGNOSIS — Z743 Need for continuous supervision: Secondary | ICD-10-CM | POA: Diagnosis not present

## 2022-03-17 DIAGNOSIS — T83018A Breakdown (mechanical) of other indwelling urethral catheter, initial encounter: Secondary | ICD-10-CM | POA: Diagnosis present

## 2022-03-17 DIAGNOSIS — N400 Enlarged prostate without lower urinary tract symptoms: Secondary | ICD-10-CM | POA: Diagnosis present

## 2022-03-17 DIAGNOSIS — Z79899 Other long term (current) drug therapy: Secondary | ICD-10-CM

## 2022-03-17 DIAGNOSIS — T83021A Displacement of indwelling urethral catheter, initial encounter: Principal | ICD-10-CM

## 2022-03-17 DIAGNOSIS — N138 Other obstructive and reflux uropathy: Secondary | ICD-10-CM | POA: Diagnosis present

## 2022-03-17 DIAGNOSIS — Z9049 Acquired absence of other specified parts of digestive tract: Secondary | ICD-10-CM

## 2022-03-17 DIAGNOSIS — T83198A Other mechanical complication of other urinary devices and implants, initial encounter: Secondary | ICD-10-CM | POA: Diagnosis not present

## 2022-03-17 DIAGNOSIS — R339 Retention of urine, unspecified: Secondary | ICD-10-CM | POA: Diagnosis not present

## 2022-03-17 DIAGNOSIS — T83098A Other mechanical complication of other indwelling urethral catheter, initial encounter: Secondary | ICD-10-CM | POA: Diagnosis not present

## 2022-03-17 DIAGNOSIS — Y846 Urinary catheterization as the cause of abnormal reaction of the patient, or of later complication, without mention of misadventure at the time of the procedure: Secondary | ICD-10-CM | POA: Diagnosis present

## 2022-03-17 DIAGNOSIS — N390 Urinary tract infection, site not specified: Secondary | ICD-10-CM | POA: Diagnosis present

## 2022-03-17 DIAGNOSIS — E876 Hypokalemia: Secondary | ICD-10-CM | POA: Diagnosis present

## 2022-03-17 DIAGNOSIS — E860 Dehydration: Secondary | ICD-10-CM | POA: Diagnosis present

## 2022-03-17 DIAGNOSIS — N401 Enlarged prostate with lower urinary tract symptoms: Secondary | ICD-10-CM | POA: Diagnosis present

## 2022-03-17 DIAGNOSIS — Z8042 Family history of malignant neoplasm of prostate: Secondary | ICD-10-CM

## 2022-03-17 LAB — BASIC METABOLIC PANEL
Anion gap: 14 (ref 5–15)
BUN: 20 mg/dL (ref 8–23)
CO2: 20 mmol/L — ABNORMAL LOW (ref 22–32)
Calcium: 9.3 mg/dL (ref 8.9–10.3)
Chloride: 100 mmol/L (ref 98–111)
Creatinine, Ser: 0.85 mg/dL (ref 0.61–1.24)
GFR, Estimated: >60 mL/min (ref >60)
Glucose, Bld: 133 mg/dL — ABNORMAL HIGH (ref 70–99)
Potassium: 3.4 mmol/L — ABNORMAL LOW (ref 3.5–5.1)
Sodium: 134 mmol/L — ABNORMAL LOW (ref 135–145)

## 2022-03-17 LAB — CBC WITH DIFFERENTIAL/PLATELET
Abs Immature Granulocytes: 0.1 10*3/uL — ABNORMAL HIGH (ref 0.00–0.07)
Basophils Absolute: 0 10*3/uL (ref 0.0–0.1)
Basophils Relative: 0 %
Eosinophils Absolute: 0 10*3/uL (ref 0.0–0.5)
Eosinophils Relative: 0 %
HCT: 42.5 % (ref 39.0–52.0)
Hemoglobin: 15.1 g/dL (ref 13.0–17.0)
Immature Granulocytes: 1 %
Lymphocytes Relative: 8 %
Lymphs Abs: 1.2 10*3/uL (ref 0.7–4.0)
MCH: 33.7 pg (ref 26.0–34.0)
MCHC: 35.5 g/dL (ref 30.0–36.0)
MCV: 94.9 fL (ref 80.0–100.0)
Monocytes Absolute: 1.4 10*3/uL — ABNORMAL HIGH (ref 0.1–1.0)
Monocytes Relative: 9 %
Neutro Abs: 12.9 10*3/uL — ABNORMAL HIGH (ref 1.7–7.7)
Neutrophils Relative %: 82 %
Platelets: 340 10*3/uL (ref 150–400)
RBC: 4.48 MIL/uL (ref 4.22–5.81)
RDW: 11.9 % (ref 11.5–15.5)
WBC: 15.7 10*3/uL — ABNORMAL HIGH (ref 4.0–10.5)
nRBC: 0 % (ref 0.0–0.2)

## 2022-03-17 LAB — URINALYSIS, ROUTINE W REFLEX MICROSCOPIC
Bilirubin Urine: NEGATIVE
Glucose, UA: NEGATIVE mg/dL
Ketones, ur: NEGATIVE mg/dL
Nitrite: NEGATIVE
Protein, ur: 100 mg/dL — AB
RBC / HPF: >50 RBC/hpf — ABNORMAL HIGH (ref 0–5)
Specific Gravity, Urine: 1.021 (ref 1.005–1.030)
pH: 7 (ref 5.0–8.0)

## 2022-03-17 NOTE — ED Provider Triage Note (Signed)
Emergency Medicine Provider Triage Evaluation Note  Calvin [name] , a 76 y.o. male  was evaluated in triage.  Pt complains of urinary retention.  Patient states that he had a catheter placed due to urinary retention on September 9.  This catheter was changed 3 days ago.  At that time the patient states that the catheter felt uncomfortable but that he had normal drainage from the catheter.  He emptied the bag this morning and has had no urinary output since that time.  He also complains of diarrhea, nausea and vomiting but states that his concern is the urinary retention  Review of Systems  Positive: As above Negative: As above  Physical Exam  BP 121/77 (BP Location: Right Arm)   Pulse (!) 112   Temp 97.8 F (36.6 C) (Oral)   Resp 17   SpO2 94%  Gen:   Awake, no distress   Resp:  Normal effort  MSK:   Moves extremities without difficulty  Other:    Medical Decision Making  Medically screening exam initiated at 7:58 PM.  Appropriate orders placed.  Calvin [name] was informed that the remainder of the evaluation will be completed by another provider, this initial triage assessment does not replace that evaluation, and the importance of remaining in the ED until their evaluation is complete.     Dorothyann Peng, [place] 03/17/22 [phone]

## 2022-03-17 NOTE — ED Triage Notes (Addendum)
Pt here from home via [facility]. Pt has had urinary catheter in placed since 9/9 and is currently being treated for a UTI, but last night he emptied the bag and today he hasn't had any urine output. Pt denies abd pain. 156/94, 97% RA

## 2022-03-18 ENCOUNTER — Encounter (HOSPITAL_COMMUNITY): Payer: Self-pay | Admitting: Family Medicine

## 2022-03-18 DIAGNOSIS — Y846 Urinary catheterization as the cause of abnormal reaction of the patient, or of later complication, without mention of misadventure at the time of the procedure: Secondary | ICD-10-CM | POA: Diagnosis not present

## 2022-03-18 DIAGNOSIS — I10 Essential (primary) hypertension: Secondary | ICD-10-CM | POA: Diagnosis not present

## 2022-03-18 DIAGNOSIS — Z79899 Other long term (current) drug therapy: Secondary | ICD-10-CM | POA: Diagnosis not present

## 2022-03-18 DIAGNOSIS — N400 Enlarged prostate without lower urinary tract symptoms: Secondary | ICD-10-CM

## 2022-03-18 DIAGNOSIS — Z8042 Family history of malignant neoplasm of prostate: Secondary | ICD-10-CM | POA: Diagnosis not present

## 2022-03-18 DIAGNOSIS — Z823 Family history of stroke: Secondary | ICD-10-CM | POA: Diagnosis not present

## 2022-03-18 DIAGNOSIS — T83511A Infection and inflammatory reaction due to indwelling urethral catheter, initial encounter: Secondary | ICD-10-CM

## 2022-03-18 DIAGNOSIS — N320 Bladder-neck obstruction: Secondary | ICD-10-CM | POA: Diagnosis present

## 2022-03-18 DIAGNOSIS — E860 Dehydration: Secondary | ICD-10-CM | POA: Diagnosis not present

## 2022-03-18 DIAGNOSIS — Z9049 Acquired absence of other specified parts of digestive tract: Secondary | ICD-10-CM | POA: Diagnosis not present

## 2022-03-18 DIAGNOSIS — N138 Other obstructive and reflux uropathy: Secondary | ICD-10-CM | POA: Diagnosis not present

## 2022-03-18 DIAGNOSIS — T83018A Breakdown (mechanical) of other indwelling urethral catheter, initial encounter: Secondary | ICD-10-CM | POA: Diagnosis not present

## 2022-03-18 DIAGNOSIS — N390 Urinary tract infection, site not specified: Secondary | ICD-10-CM

## 2022-03-18 DIAGNOSIS — N401 Enlarged prostate with lower urinary tract symptoms: Secondary | ICD-10-CM | POA: Diagnosis not present

## 2022-03-18 DIAGNOSIS — N3001 Acute cystitis with hematuria: Secondary | ICD-10-CM

## 2022-03-18 DIAGNOSIS — H9193 Unspecified hearing loss, bilateral: Secondary | ICD-10-CM | POA: Diagnosis not present

## 2022-03-18 DIAGNOSIS — E872 Acidosis, unspecified: Secondary | ICD-10-CM | POA: Diagnosis not present

## 2022-03-18 DIAGNOSIS — R339 Retention of urine, unspecified: Secondary | ICD-10-CM | POA: Diagnosis not present

## 2022-03-18 DIAGNOSIS — Z8249 Family history of ischemic heart disease and other diseases of the circulatory system: Secondary | ICD-10-CM | POA: Diagnosis not present

## 2022-03-18 DIAGNOSIS — R197 Diarrhea, unspecified: Secondary | ICD-10-CM | POA: Diagnosis not present

## 2022-03-18 DIAGNOSIS — E876 Hypokalemia: Secondary | ICD-10-CM | POA: Diagnosis not present

## 2022-03-18 DIAGNOSIS — E871 Hypo-osmolality and hyponatremia: Secondary | ICD-10-CM | POA: Diagnosis not present

## 2022-03-18 DIAGNOSIS — R194 Change in bowel habit: Secondary | ICD-10-CM | POA: Diagnosis not present

## 2022-03-18 LAB — URINALYSIS, ROUTINE W REFLEX MICROSCOPIC
Bilirubin Urine: NEGATIVE
Glucose, UA: NEGATIVE mg/dL
Ketones, ur: NEGATIVE mg/dL
Nitrite: NEGATIVE
Protein, ur: 100 mg/dL — AB
Specific Gravity, Urine: 1.012 (ref 1.005–1.030)
WBC, UA: >50 WBC/hpf — ABNORMAL HIGH (ref 0–5)
pH: 6 (ref 5.0–8.0)

## 2022-03-18 LAB — CBC
HCT: 38.2 % — ABNORMAL LOW (ref 39.0–52.0)
Hemoglobin: 13.4 g/dL (ref 13.0–17.0)
MCH: 33.8 pg (ref 26.0–34.0)
MCHC: 35.1 g/dL (ref 30.0–36.0)
MCV: 96.5 fL (ref 80.0–100.0)
Platelets: 263 10*3/uL (ref 150–400)
RBC: 3.96 MIL/uL — ABNORMAL LOW (ref 4.22–5.81)
RDW: 12.1 % (ref 11.5–15.5)
WBC: 13.1 10*3/uL — ABNORMAL HIGH (ref 4.0–10.5)
nRBC: 0 % (ref 0.0–0.2)

## 2022-03-18 LAB — BASIC METABOLIC PANEL
Anion gap: 10 (ref 5–15)
BUN: 18 mg/dL (ref 8–23)
CO2: 21 mmol/L — ABNORMAL LOW (ref 22–32)
Calcium: 8.8 mg/dL — ABNORMAL LOW (ref 8.9–10.3)
Chloride: 105 mmol/L (ref 98–111)
Creatinine, Ser: 0.64 mg/dL (ref 0.61–1.24)
GFR, Estimated: >60 mL/min (ref >60)
Glucose, Bld: 115 mg/dL — ABNORMAL HIGH (ref 70–99)
Potassium: 3.5 mmol/L (ref 3.5–5.1)
Sodium: 136 mmol/L (ref 135–145)

## 2022-03-18 LAB — MAGNESIUM: Magnesium: 1.8 mg/dL (ref 1.7–2.4)

## 2022-03-18 LAB — LACTIC ACID, PLASMA: Lactic Acid, Venous: 1 mmol/L (ref 0.5–1.9)

## 2022-03-18 MED ORDER — SERTRALINE HCL 100 MG PO TABS
100.0000 mg | ORAL_TABLET | Freq: Every day | ORAL | Status: DC
  Administered 2022-03-18 – 2022-03-22 (×5): 100 mg via ORAL
  Filled 2022-03-18 (×5): qty 1

## 2022-03-18 MED ORDER — POTASSIUM CHLORIDE CRYS ER 10 MEQ PO TBCR
10.0000 meq | EXTENDED_RELEASE_TABLET | Freq: Two times a day (BID) | ORAL | Status: DC
  Administered 2022-03-18: 10 meq via ORAL
  Filled 2022-03-18: qty 1

## 2022-03-18 MED ORDER — ENOXAPARIN SODIUM 40 MG/0.4ML IJ SOSY
40.0000 mg | PREFILLED_SYRINGE | INTRAMUSCULAR | Status: DC
  Administered 2022-03-18 – 2022-03-21 (×4): 40 mg via SUBCUTANEOUS
  Filled 2022-03-18 (×4): qty 0.4

## 2022-03-18 MED ORDER — SODIUM CHLORIDE 0.9 % IV BOLUS
1000.0000 mL | Freq: Once | INTRAVENOUS | Status: AC
  Administered 2022-03-18: 1000 mL via INTRAVENOUS

## 2022-03-18 MED ORDER — ACETAMINOPHEN 650 MG RE SUPP: 650.0000 mg | Freq: Four times a day (QID) | RECTAL | Status: DC | PRN

## 2022-03-18 MED ORDER — SODIUM CHLORIDE 0.9 % IV SOLN
1.0000 g | INTRAVENOUS | Status: DC
  Administered 2022-03-18 – 2022-03-22 (×5): 1 g via INTRAVENOUS
  Filled 2022-03-18 (×5): qty 10

## 2022-03-18 MED ORDER — AMLODIPINE BESYLATE 5 MG PO TABS
5.0000 mg | ORAL_TABLET | Freq: Every day | ORAL | Status: DC
  Administered 2022-03-18 – 2022-03-22 (×5): 5 mg via ORAL
  Filled 2022-03-18 (×5): qty 1

## 2022-03-18 MED ORDER — ACETAMINOPHEN 325 MG PO TABS
650.0000 mg | ORAL_TABLET | Freq: Four times a day (QID) | ORAL | Status: DC | PRN
  Administered 2022-03-21: 650 mg via ORAL
  Filled 2022-03-18 (×2): qty 2

## 2022-03-18 MED ORDER — ATORVASTATIN CALCIUM 10 MG PO TABS
20.0000 mg | ORAL_TABLET | Freq: Every day | ORAL | Status: DC
  Administered 2022-03-18 – 2022-03-22 (×5): 20 mg via ORAL
  Filled 2022-03-18 (×5): qty 2

## 2022-03-18 MED ORDER — POTASSIUM CHLORIDE IN NACL 20-0.9 MEQ/L-% IV SOLN
INTRAVENOUS | Status: AC
  Filled 2022-03-18: qty 1000

## 2022-03-18 MED ORDER — PIPERACILLIN-TAZOBACTAM 3.375 G IVPB 30 MIN
3.3750 g | Freq: Once | INTRAVENOUS | Status: AC
  Administered 2022-03-18: 3.375 g via INTRAVENOUS
  Filled 2022-03-18: qty 50

## 2022-03-18 MED ORDER — LOSARTAN POTASSIUM 50 MG PO TABS
100.0000 mg | ORAL_TABLET | Freq: Every day | ORAL | Status: DC
  Administered 2022-03-18 – 2022-03-22 (×5): 100 mg via ORAL
  Filled 2022-03-18 (×5): qty 2

## 2022-03-18 MED ORDER — FINASTERIDE 5 MG PO TABS
5.0000 mg | ORAL_TABLET | Freq: Every day | ORAL | Status: DC
  Administered 2022-03-18 – 2022-03-22 (×5): 5 mg via ORAL
  Filled 2022-03-18 (×5): qty 1

## 2022-03-18 MED ORDER — OXYCODONE HCL 5 MG PO TABS
5.0000 mg | ORAL_TABLET | ORAL | Status: DC | PRN
  Administered 2022-03-21: 5 mg via ORAL
  Filled 2022-03-18: qty 1

## 2022-03-18 MED ORDER — TAMSULOSIN HCL 0.4 MG PO CAPS
0.4000 mg | ORAL_CAPSULE | Freq: Every day | ORAL | Status: DC
  Administered 2022-03-18 – 2022-03-22 (×5): 0.4 mg via ORAL
  Filled 2022-03-18 (×5): qty 1

## 2022-03-18 NOTE — ED Notes (Signed)
This NT emptied 3,250 ml of urine from the new catheter bag. Urine amber color with foul odor. RN notified.

## 2022-03-18 NOTE — ED Notes (Signed)
Patient resting in bed with eyes closed, no s/s of distress, respirations even and unlabored, will continue to monitor.

## 2022-03-18 NOTE — ED Notes (Signed)
NT informed RN that pt choked and vomited while eating food, pt now has mild wheezing. Admitted paged.

## 2022-03-18 NOTE — ED Notes (Signed)
RN spoke w/ admitting MD, verbal order given for continuous pulse oximeter monitoring. Order placed. Pt in no apparent distress, SpO2 95% on RA, equal and unlabored respirations noted

## 2022-03-18 NOTE — ED Provider Notes (Signed)
[facility] EMERGENCY DEPARTMENT Provider Note   CSN: [id number] Arrival date & time: 03/17/22  1854     History  Chief Complaint  Patient presents with   foley catheter problem    Calvin [name] is a 76 y.o. male.  The history is provided by the patient and medical records.  Calvin [name] is a 76 y.o. male who presents to the Emergency Department complaining of catheter problem.  He has a history of urinary retention secondary to prostatic mass and had a catheter exchange 3 days ago.  He states that he has not been feeling right since and has not had any output in his catheter bag for 24 hours.  No fevers, abdominal pain, nausea, vomiting.  He has been experiencing diarrhea since he was hospitalized for bladder outlet obstruction and acute renal failure.  Only 1 bowel movement today. Catheter exchange three days ago.  Didn't feel right.  Not currently on antibiotics.  Lives alone.     Home Medications Prior to Admission medications   Medication Sig Start Date End Date Taking? Authorizing Provider  amLODipine (NORVASC) 5 MG tablet Take 1 tablet (5 mg total) by mouth daily. 04/25/21  Yes Laurey Morale, MD  atorvastatin (LIPITOR) 20 MG tablet Take 20 mg by mouth daily. 02/04/22  Yes [provider]  cyanocobalamin (VITAMIN B12) 1000 MCG/ML injection INJECT 1 ML (1,000 MCG TOTAL) INTO THE MUSCLE ONCE A WEEK. 03/13/22  Yes Laurey Morale, MD  finasteride (PROSCAR) 5 MG tablet Take 1 tablet (5 mg total) by mouth daily. 04/25/21  Yes Laurey Morale, MD  losartan (COZAAR) 100 MG tablet Take 1 tablet (100 mg total) by mouth daily. 04/25/21  Yes Laurey Morale, MD  sertraline (ZOLOFT) 100 MG tablet TAKE 1 TABLET BY MOUTH EVERY DAY 02/05/22  Yes Laurey Morale, MD  sertraline (ZOLOFT) 50 MG tablet Take 50 mg by mouth daily. Take with '100mg'$  tablet for a total dose of '150mg'$  daily for 30 days 03/01/22  Yes [provider]  tamsulosin (FLOMAX) 0.4 MG CAPS capsule  Take 1 capsule (0.4 mg total) by mouth daily. 04/25/21  Yes Laurey Morale, MD  diazepam (VALIUM) 5 MG tablet Take 1 or 2 tablets about 30 minutes before procedures such as MRI scans 07/28/21   Laurey Morale, MD  hydrochlorothiazide (HYDRODIURIL) 25 MG tablet TAKE 1 TABLET BY MOUTH EVERY DAY Patient not taking: Reported on 03/18/2022 07/10/21   Laurey Morale, MD  potassium chloride (KLOR-CON M) 10 MEQ tablet Take 1 tablet (10 mEq total) by mouth 2 (two) times daily. Patient not taking: Reported on 03/18/2022 12/26/21   Laurey Morale, MD  SYRINGE-NEEDLE, DISP, 3 ML (BD ECLIPSE SYRINGE/NEEDLE) 25G X 5/8" 3 ML MISC Use as directed once a week 12/26/21   Laurey Morale, MD      Allergies    Patient has no known allergies.    Review of Systems   Review of Systems  All other systems reviewed and are negative.   Physical Exam Updated Vital Signs BP (!) 156/98   Pulse 97   Temp 98.9 F (37.2 C) (Oral)   Resp 18   SpO2 94%  Physical Exam Vitals and nursing note reviewed.  Constitutional:      Appearance: He is well-developed.     Comments: Stool on lower abdomen, bilateral lower extremities  HENT:     Head: Normocephalic and atraumatic.  Cardiovascular:     Rate  and Rhythm: Regular rhythm. Tachycardia present.  Pulmonary:     Effort: Pulmonary effort is normal. No respiratory distress.  Abdominal:     General: There is distension.     Palpations: Abdomen is soft.     Tenderness: There is no abdominal tenderness. There is no guarding or rebound.  Genitourinary:    Comments: Catheter in place with small amount of blood at the meatus, no urine in the catheter bag Musculoskeletal:        General: No tenderness.  Skin:    General: Skin is warm and dry.  Neurological:     Mental Status: He is alert and oriented to person, place, and time.  Psychiatric:        Behavior: Behavior normal.     ED Results / Procedures / Treatments   Labs (all labs ordered are listed, but only abnormal  results are displayed) Labs Reviewed  BASIC METABOLIC PANEL - Abnormal; Notable for the following components:      Result Value   Sodium 134 (*)    Potassium 3.4 (*)    CO2 20 (*)    Glucose, Bld 133 (*)    All other components within normal limits  CBC WITH DIFFERENTIAL/PLATELET - Abnormal; Notable for the following components:   WBC 15.7 (*)    Neutro Abs 12.9 (*)    Monocytes Absolute 1.4 (*)    Abs Immature Granulocytes 0.10 (*)    All other components within normal limits  URINALYSIS, ROUTINE W REFLEX MICROSCOPIC - Abnormal; Notable for the following components:   Color, Urine AMBER (*)    APPearance TURBID (*)    Hgb urine dipstick MODERATE (*)    Protein, ur 100 (*)    Leukocytes,Ua SMALL (*)    RBC / HPF >50 (*)    Bacteria, UA MANY (*)    All other components within normal limits  URINALYSIS, ROUTINE W REFLEX MICROSCOPIC - Abnormal; Notable for the following components:   Color, Urine AMBER (*)    APPearance CLOUDY (*)    Hgb urine dipstick LARGE (*)    Protein, ur 100 (*)    Leukocytes,Ua LARGE (*)    WBC, UA >50 (*)    Bacteria, UA MANY (*)    All other components within normal limits  URINE CULTURE  CULTURE, BLOOD (ROUTINE X 2)  CULTURE, BLOOD (ROUTINE X 2)  LACTIC ACID, PLASMA    EKG None  Radiology No results found.  Procedures Procedures    Medications Ordered in ED Medications  amLODipine (NORVASC) tablet 5 mg (has no administration in time range)  atorvastatin (LIPITOR) tablet 20 mg (has no administration in time range)  losartan (COZAAR) tablet 100 mg (has no administration in time range)  sertraline (ZOLOFT) tablet 100 mg (has no administration in time range)  finasteride (PROSCAR) tablet 5 mg (has no administration in time range)  tamsulosin (FLOMAX) capsule 0.4 mg (has no administration in time range)  enoxaparin (LOVENOX) injection 40 mg (40 mg Subcutaneous Given 03/18/22 0624)  acetaminophen (TYLENOL) tablet 650 mg (has no administration  in time range)    Or  acetaminophen (TYLENOL) suppository 650 mg (has no administration in time range)  oxyCODONE (Oxy IR/ROXICODONE) immediate release tablet 5 mg (has no administration in time range)  0.9 % NaCl with KCl 20 mEq/ L  infusion ( Intravenous New Bag/Given 03/18/22 0624)  potassium chloride (KLOR-CON M) CR tablet 10 mEq (has no administration in time range)  cefTRIAXone (ROCEPHIN) 1 g in sodium  chloride 0.9 % 100 mL IVPB (has no administration in time range)  piperacillin-tazobactam (ZOSYN) IVPB 3.375 g (0 g Intravenous Stopped 03/18/22 0433)  sodium chloride 0.9 % bolus 1,000 mL (0 mLs Intravenous Stopped 03/18/22 0450)    ED Course/ Medical Decision Making/ A&P                           Medical Decision Making Amount and/or Complexity of Data Reviewed Labs: ordered.  Risk Prescription drug management. Decision regarding hospitalization.  Pt with hx/o bladder outlet obstruction with urinary catheter in place here for evaluation of discomfort in setting of replaced urinary catheter with decreased urinary output.  Patient with empty catheter bag, full bladder on bedside ultrasound.  Catheter was removed and replaced with a new catheter with return of 3248m of cloudy dark urine.  Second urinalysis sent is off of the new catheter.  Patient continues to put out dark and cloudy urine.  Patient with borderline blood pressure, tachycardia at time of ED presentation.  Given degree of retention, vital signs and urinalysis results concern for recurrent infection and he was started on antibiotics, IV fluids.  Recommend admission for observation         Final Clinical Impression(s) / ED Diagnoses Final diagnoses:  Dislodged Foley catheter ([facility]  Urinary retention  Acute UTI    Rx / DC Orders ED Discharge Orders     None         RQuintella Reichert MD 03/18/22 [phone]

## 2022-03-18 NOTE — ED Notes (Signed)
This NT bladder scanned the patient, the scanner read >841. MD and RN notified.

## 2022-03-18 NOTE — Progress Notes (Signed)
    SHORT PROGRESS NOTE  76 y/o with HTN, BPH with a foley cath present for no urine output   on 9/29. His catheter was changed 3 days prior to this episode.   He was admitted from 9/9-9/15 for AKI, b/l hydronephrosis, pyelonephritis and acute urinary retention. He had a foley cath placed, finised a course of antibiotics and was recommended to f/u with urology as outpt. He has an appt next month.He was discharged to a SNF and was later discharged home.   In the ED: found to have an obstructed foley which was replaced WBC 15.7 Sodium 134 K 3.4 Bicarb 20 UA consistent with a UTI  Subjective:   Feels tired. Poor appetite.    Today's Vitals   03/18/22 1145 03/18/22 1230 03/18/22 1319 03/18/22 1330  BP: (!) 139/95 (!) 136/92  (!) 142/104  Pulse: 97 89  84  Resp:    17  Temp:   98.6 F (37 C) 98 F (36.7 C)  TempSrc:   Oral   SpO2: 96% 94%  95%  PainSc:       There is no height or weight on file to calculate BMI.    Assessment and Plan: Principal Problem:   UTI (urinary tract infection) due to urinary indwelling Foley catheter ([facility]) - in setting of urinary obstruction- BPH - f/u urine culture - cont Ceftriaxone, Proscar and Flomax  Active Problems: Hypokalemia - replaced - K 3.4> 3.5  Dehydration/ hyponatremia - improving- cont IVF as he states his oral intake is poor - HCTZ    Essential hypertension - Amlodipine, Losartan    Debbe Odea, MD 03/18/2022 [facility] Pager: [email or web address]

## 2022-03-18 NOTE — H&P (Signed)
History and Physical    Calvin [name] WYO:[id number] DOB: December 19, 1945 DOA: 03/17/2022  PCP: Laurey Morale, MD   Patient coming from: Home   Chief Complaint: No urine output   HPI: Calvin [name] is a 76 y.o. male with medical history significant for hypertension, nephrolithiasis, and enlarged prostate with bladder outlet obstruction and placement of Foley catheter last month who now presents for evaluation of no urine output since 03/16/2022.  Patient reports that his Foley catheter was exchanged 3 days ago and he has not had any output since draining the bag on 03/16/2022.  He denies abdominal pain or fever.  ED Course: Upon arrival to the ED, patient is found to be afebrile and saturating mid 90s on room air with transient tachycardia and SBP 93 and greater.  Blood work notable for WBC of 15,700 and normal lactate.  Catheter was replaced and more than 3 L of urine was drained in the ED.  Blood and urine cultures were collected and the patient was given a liter of normal saline and Zosyn.  Review of Systems:  All other systems reviewed and apart from HPI, are negative.  Past Medical History:  Diagnosis Date   Benign prostatic hypertrophy    ED (erectile dysfunction)    Headache(784.0)    Hypertension    Hypogonadism male    Nephrolithiasis    hx of had lithotripsy in 1-10.    Past Surgical History:  Procedure Laterality Date   colonoscopy  10/05/2020   per Dr. Fuller Plan, adenomatous polyps, repeat in 3 yrs   [place]  03/12/2012   Procedure: APPENDECTOMY LAPAROSCOPIC;  Surgeon: Harl Bowie, MD;  Location: [facility];  Service: General;  Laterality: N/A;   STAPLE HEMORRHOIDECTOMY  04/14/2010   per Dr. Johney Maine    TONSILLECTOMY      Social History:   reports that he has never smoked. He has never used smokeless tobacco. He reports current alcohol use of about 14.0 standard drinks of alcohol per week. He reports that he does not use drugs.  No  Known Allergies  Family History  Problem Relation Age of Onset   Heart Problems Mother        Caused by a Virus when she was a teen   Stroke Father    Prostate cancer Father    Hypertension Other    Stroke Other    Heart disease Other        rheumatic    Coronary artery disease Other    Healthy Child    Colon cancer Neg Hx    Colon polyps Neg Hx    Esophageal cancer Neg Hx    Rectal cancer Neg Hx    Stomach cancer Neg Hx      Prior to Admission medications   Medication Sig Start Date End Date Taking? Authorizing Provider  amLODipine (NORVASC) 5 MG tablet Take 1 tablet (5 mg total) by mouth daily. 04/25/21   Laurey Morale, MD  atorvastatin (LIPITOR) 20 MG tablet Take 20 mg by mouth daily. 02/04/22   [provider]  cyanocobalamin (VITAMIN B12) 1000 MCG/ML injection INJECT 1 ML (1,000 MCG TOTAL) INTO THE MUSCLE ONCE A WEEK. 03/13/22   Laurey Morale, MD  diazepam (VALIUM) 5 MG tablet Take 1 or 2 tablets about 30 minutes before procedures such as MRI scans 07/28/21   Laurey Morale, MD  finasteride (PROSCAR) 5 MG tablet Take 1 tablet (5 mg total)  by mouth daily. 04/25/21   Laurey Morale, MD  hydrochlorothiazide (HYDRODIURIL) 25 MG tablet TAKE 1 TABLET BY MOUTH EVERY DAY 07/10/21   Laurey Morale, MD  losartan (COZAAR) 100 MG tablet Take 1 tablet (100 mg total) by mouth daily. 04/25/21   Laurey Morale, MD  potassium chloride (KLOR-CON M) 10 MEQ tablet Take 1 tablet (10 mEq total) by mouth 2 (two) times daily. 12/26/21   Laurey Morale, MD  sertraline (ZOLOFT) 100 MG tablet TAKE 1 TABLET BY MOUTH EVERY DAY 02/05/22   Laurey Morale, MD  SYRINGE-NEEDLE, DISP, 3 ML (BD ECLIPSE SYRINGE/NEEDLE) 25G X 5/8" 3 ML MISC Use as directed once a week 12/26/21   Laurey Morale, MD  tamsulosin (FLOMAX) 0.4 MG CAPS capsule Take 1 capsule (0.4 mg total) by mouth daily. 04/25/21   Laurey Morale, MD    Physical Exam: Vitals:   03/18/22 0130 03/18/22 0230 03/18/22 0400 03/18/22 0500  BP: 111/85  127/72 (!) 152/102 (!) 146/93  Pulse: (!) 102 99 98 92  Resp: '14 16 18 16  '$ Temp:      TempSrc:      SpO2: 97% 91% 94% 94%    Constitutional: NAD, no diaphoresis or pallor   Eyes: PERTLA, lids and conjunctivae normal ENMT: Mucous membranes are moist. Posterior pharynx clear of any exudate or lesions.   Neck: supple, no masses  Respiratory: no wheezing, no crackles. No accessory muscle use.  Cardiovascular: S1 & S2 heard, regular rate and rhythm. No extremity edema.   Abdomen: Soft, no tenderness. Bowel sounds active.  Musculoskeletal: no clubbing / cyanosis. No joint deformity upper and lower extremities.   Skin: no significant rashes, lesions, ulcers. Warm, dry, well-perfused. Neurologic: Gross hearing deficit. Moving all extremities. Alert and oriented.  Psychiatric: Calm. Cooperative.    Labs and Imaging on Admission: I have personally reviewed following labs and imaging studies  CBC: Recent Labs  Lab 03/17/22 2005  WBC 15.7*  NEUTROABS 12.9*  HGB 15.1  HCT 42.5  MCV 94.9  PLT 614   Basic Metabolic Panel: Recent Labs  Lab 03/14/22 1523 03/17/22 2005  NA 136 134*  K 4.2 3.4*  CL 100 100  CO2 25 20*  GLUCOSE 95 133*  BUN 20 20  CREATININE 0.98 0.85  CALCIUM 9.7 9.3   GFR: Estimated Creatinine Clearance: 93.4 mL/min (by C-G formula based on SCr of 0.85 mg/dL). Liver Function Tests: No results for input(s): "AST", "ALT", "ALKPHOS", "BILITOT", "PROT", "ALBUMIN" in the last 168 hours. No results for input(s): "LIPASE", "AMYLASE" in the last 168 hours. No results for input(s): "AMMONIA" in the last 168 hours. Coagulation Profile: No results for input(s): "INR", "PROTIME" in the last 168 hours. Cardiac Enzymes: No results for input(s): "CKTOTAL", "CKMB", "CKMBINDEX", "TROPONINI" in the last 168 hours. BNP (last 3 results) No results for input(s): "PROBNP" in the last 8760 hours. HbA1C: No results for input(s): "HGBA1C" in the last 72 hours. CBG: No results for  input(s): "GLUCAP" in the last 168 hours. Lipid Profile: No results for input(s): "CHOL", "HDL", "LDLCALC", "TRIG", "CHOLHDL", "LDLDIRECT" in the last 72 hours. Thyroid Function Tests: No results for input(s): "TSH", "T4TOTAL", "FREET4", "T3FREE", "THYROIDAB" in the last 72 hours. Anemia Panel: No results for input(s): "VITAMINB12", "FOLATE", "FERRITIN", "TIBC", "IRON", "RETICCTPCT" in the last 72 hours. Urine analysis:    Component Value Date/Time   COLORURINE AMBER (A) 03/18/2022 0205   APPEARANCEUR CLOUDY (A) 03/18/2022 0205   LABSPEC 1.012 03/18/2022 0205  PHURINE 6.0 03/18/2022 0205   GLUCOSEU NEGATIVE 03/18/2022 0205   HGBUR LARGE (A) 03/18/2022 0205   HGBUR negative 12/14/2009 0823   BILIRUBINUR NEGATIVE 03/18/2022 0205   BILIRUBINUR neg 09/14/2020 1459   KETONESUR NEGATIVE 03/18/2022 0205   PROTEINUR 100 (A) 03/18/2022 0205   UROBILINOGEN 1.0 09/14/2020 1459   UROBILINOGEN 1.0 03/12/2012 1250   NITRITE NEGATIVE 03/18/2022 0205   LEUKOCYTESUR LARGE (A) 03/18/2022 0205   Sepsis Labs: '@LABRCNTIP'$ (procalcitonin:4,lacticidven:4) )No results found for this or any previous visit (from the past 240 hour(s)).   Radiological Exams on Admission: No results found.   Assessment/Plan  1. UTI; Foley catheter malfunction  - Patient with Foley for bladder outlet obstruction presents with no UOP since 9/29; >3 L urine was drained upon insertion of new Foley  - He has multiple SIRS criteria and UA compatible with infection; fortunately he is afebrile with normal lactate and SBP >90  - Blood and urine cultures were collected in ED and he was treated with Zosyn  - Treat with Rocephin for now while following cultures and clinical course    2. Bladder outlet obstruction; enlarged prostate  - Recent CT demonstrated prostatic hypertrophy with possible mass  - Continue Foley catheter and outpatient urology follow-up as planned    3. Hypertension  - Continue losartan and Norvasc      DVT prophylaxis: Lovenox  Code Status: Full  Level of Care: Level of care: Med-Surg Family Communication: None present  Disposition Plan:  Patient is from: home  Anticipated d/c is to: Home Anticipated d/c date is: Possibly as early as 03/19/22  Patient currently: Pending improved/stable vitals, transition to oral antibiotic  Consults called: none  Admission status: Observation     Vianne Bulls, MD [facility]  03/18/2022, 6:08 AM

## 2022-03-18 NOTE — ED Notes (Addendum)
Patient cleaned of incontinence of bowels.

## 2022-03-19 ENCOUNTER — Inpatient Hospital Stay (HOSPITAL_COMMUNITY): Payer: Medicare HMO

## 2022-03-19 DIAGNOSIS — E860 Dehydration: Secondary | ICD-10-CM

## 2022-03-19 DIAGNOSIS — E876 Hypokalemia: Secondary | ICD-10-CM | POA: Diagnosis not present

## 2022-03-19 DIAGNOSIS — N320 Bladder-neck obstruction: Secondary | ICD-10-CM | POA: Diagnosis not present

## 2022-03-19 DIAGNOSIS — T83511A Infection and inflammatory reaction due to indwelling urethral catheter, initial encounter: Secondary | ICD-10-CM | POA: Diagnosis not present

## 2022-03-19 LAB — BASIC METABOLIC PANEL
Anion gap: 10 (ref 5–15)
BUN: 15 mg/dL (ref 8–23)
CO2: 22 mmol/L (ref 22–32)
Calcium: 8.8 mg/dL — ABNORMAL LOW (ref 8.9–10.3)
Chloride: 103 mmol/L (ref 98–111)
Creatinine, Ser: 0.7 mg/dL (ref 0.61–1.24)
GFR, Estimated: >60 mL/min (ref >60)
Glucose, Bld: 103 mg/dL — ABNORMAL HIGH (ref 70–99)
Potassium: 3.1 mmol/L — ABNORMAL LOW (ref 3.5–5.1)
Sodium: 135 mmol/L (ref 135–145)

## 2022-03-19 LAB — CBC
HCT: 33.4 % — ABNORMAL LOW (ref 39.0–52.0)
Hemoglobin: 12 g/dL — ABNORMAL LOW (ref 13.0–17.0)
MCH: 34 pg (ref 26.0–34.0)
MCHC: 35.9 g/dL (ref 30.0–36.0)
MCV: 94.6 fL (ref 80.0–100.0)
Platelets: 239 10*3/uL (ref 150–400)
RBC: 3.53 MIL/uL — ABNORMAL LOW (ref 4.22–5.81)
RDW: 12 % (ref 11.5–15.5)
WBC: 10.6 10*3/uL — ABNORMAL HIGH (ref 4.0–10.5)
nRBC: 0 % (ref 0.0–0.2)

## 2022-03-19 LAB — URINE CULTURE: Culture: >=100000 — AB

## 2022-03-19 LAB — MAGNESIUM: Magnesium: 1.7 mg/dL (ref 1.7–2.4)

## 2022-03-19 MED ORDER — POTASSIUM CHLORIDE CRYS ER 20 MEQ PO TBCR
40.0000 meq | EXTENDED_RELEASE_TABLET | ORAL | Status: AC
  Administered 2022-03-19 (×2): 40 meq via ORAL
  Filled 2022-03-19 (×2): qty 2

## 2022-03-19 MED ORDER — DIPHENOXYLATE-ATROPINE 2.5-0.025 MG/5ML PO LIQD
5.0000 mL | Freq: Once | ORAL | Status: AC
  Administered 2022-03-19: 5 mL via ORAL
  Filled 2022-03-19: qty 5

## 2022-03-19 MED ORDER — LOPERAMIDE HCL 2 MG PO CAPS
2.0000 mg | ORAL_CAPSULE | ORAL | Status: DC | PRN
  Administered 2022-03-19 – 2022-03-21 (×2): 2 mg via ORAL
  Filled 2022-03-19 (×3): qty 1

## 2022-03-19 NOTE — TOC Initial Note (Addendum)
Transition of Care [facility]) - Initial/Assessment Note    Patient Details  Name: Calvin [name] [name]: [id number] Date of Birth: February 29, 1946  [facility] CM/SW Contact:    Curlene Labrum, RN Phone Number: 03/19/2022, 2:17 PM  Clinical Narrative:                 CM met with the patient at the bedside to discuss transitions of care needs.  The patient was admitted from his home (alone) after recently discharged from [facility] facility.  The patient states that his foley stopped working shortly after he returned home and he was unable to urinate and returned to the hospital with an infection.  The patient states that the nursing rehabilitation center set him up for home health services - but they were unable to start services since he was only home for 3 days before returning to the hospital for care.  I called Ebony Hail, CM at [facility] and the facility was agreeable to offer a bed to the patient and restart insurance authorization.  The last time the patient was admitted to the hospital - a peer-to-peer was requested before the insurance approved SNf placement.  I called and left a voicemail with the patient's daughter, Karma Ganja.  SNF work up done - the patient was agreeable to returning to [facility] if insurance will approve.  I called Ebony Hail, CM at [facility] and they will start insurance authorization today.  Ebony Hail, CM at the facility states that the patient was set up with [facility] after discharge home from the facility.  CM will continue to follow the patient for SNF placement versus home with home health depending on insurance authorization.  Expected Discharge Plan: Skilled Nursing Facility Barriers to Discharge: Continued Medical Work up   Patient Goals and CMS Choice Patient states their goals for this hospitalization and ongoing recovery are:: To go to rehab CMS Medicare.gov Compare Post Acute Care list  provided to:: Patient Choice offered to / list presented to : Patient  Expected Discharge Plan and Services Expected Discharge Plan: [place]   Discharge Planning Services: CM Consult Post Acute Care Choice: [place] Living arrangements for the past 2 months: Single Family Home                                      Prior Living Arrangements/Services Living arrangements for the past 2 months: Single Family Home Lives with:: Self Patient language and need for interpreter reviewed:: Yes Do you feel safe going back to the place where you live?: Yes      Need for Family Participation in Patient Care: Yes (Comment) Care giver support system in place?: Yes (comment) Current home services: DME ([facility] at home) Criminal Activity/Legal Involvement Pertinent to Current Situation/Hospitalization: No - Comment as needed  Activities of Daily Living      Permission Sought/Granted Permission sought to share information with : Case Manager, Customer service manager, Family Supports Permission granted to share information with : Yes, Verbal Permission Granted     Permission granted to share info w AGENCY: [facility] facility  Permission granted to share info w Relationship: daughter - Karma Ganja - [phone]     Emotional Assessment Appearance:: Appears stated age Attitude/Demeanor/Rapport: Gracious Affect (typically observed): Accepting Orientation: : Oriented to Self, Oriented to Place, Oriented to  Time, Oriented to Situation Alcohol / Substance Use: Not Applicable  Psych Involvement: No (comment)  Admission diagnosis:  Urinary retention [R33.9] UTI (urinary tract infection) [N39.0] Acute UTI [N39.0] Dislodged Foley catheter ([place]) [K35.075P] Patient Active Problem List   Diagnosis Date Noted   Bladder outflow obstruction 03/18/2022   Dehydration 03/18/2022   UTI (urinary tract infection) due to urinary indwelling Foley catheter ([place])  03/18/2022   Acute pyelonephritis 02/25/2022   Pyelonephritis 02/25/2022   B12 deficiency 02/06/2022   Bilateral leg weakness 01/10/2022   Depression with anxiety 12/25/2021   Alcohol abuse 12/25/2021   Low back pain 06/09/2020   COVID-19 virus infection 02/24/2020   Primary osteoarthritis of left knee 04/23/2018   Hypogonadism in male 01/19/2016   Foot swelling 01/19/2016   Insomnia 10/30/2013   INTERNAL HEMORRHOIDS WITH OTHER COMPLICATION 32/25/6720   Enlarged prostate 10/25/2008   NEPHROLITHIASIS, HX OF 10/25/2008   Headache 09/15/2007   Essential hypertension 07/07/2007   PCP:  Laurey Morale, MD Pharmacy:   [id number] [place] [id number]: [phone]: [phone]    Social Determinants of Health (SDOH) Interventions    Readmission Risk Interventions    03/19/2022    2:17 PM 02/27/2022    2:30 PM  Readmission Risk Prevention Plan  Post Dischage Appt Complete Complete  Medication Screening Complete Complete  Transportation Screening Complete Complete

## 2022-03-19 NOTE — Progress Notes (Signed)
[facility] Progress Note  Patient: Calvin [name]     QQV:[id number]  DOA: 03/17/2022   PCP: Laurey Morale, MD       Brief hospital course: 76 y/o with HTN, BPH with a foley cath present for no urine output   on 9/29. His catheter was changed 3 days prior to this episode.    He was admitted from 9/9-9/15 for AKI, b/l hydronephrosis, pyelonephritis and acute urinary retention. He had a foley cath placed, finised a course of antibiotics and was recommended to f/u with urology as outpt. He has an appt next month.He was discharged to a SNF and was later discharged home.    In the ED: found to have an obstructed foley which was replaced WBC 15.7 Sodium 134 K 3.4 Bicarb 20 UA consistent with a UTI  Subjective:  He states he is having loose stools since his last admission to the hospital. Having 1- 2 episodes a day. No abdominal pain, nausea, vomiting or diarrhea.  Assessment and Plan: Principal Problem:   UTI (urinary tract infection) due to urinary indwelling Foley catheter ([place]) - in setting of urinary obstruction- BPH - urine culture> multiple morphotypes  - cont Ceftriaxone, Proscar and Flomax - leukocytosis improving   Active Problems: Hypokalemia - replaced - K 3.4> 3.5> 3.1 - cont to replace    Dehydration/ hyponatremia - given IVF - HCTZ on hold  Loose stools - abdominal xray unrevealing - will order a dose of Lomotil  Metabolic acidosis - resolved     Essential hypertension - Amlodipine, Losartan  HOH    Deconditioning - will need SNF- he is in agreement- he went to a SNF on 03/02/22 for about 1 wk prior to going home     Code Status: Full Code DVT prophylaxis:  enoxaparin (LOVENOX) injection 40 mg Start: 03/18/22 0530  Consultants: none Level of Care: Level of care: Med-Surg  Objective:   Vitals:   03/19/22 0335 03/19/22 0341 03/19/22 0822 03/19/22 1212  BP: (!) 133/98  (!) 141/99 134/89  Pulse: 91  85 86  Resp: '18  16 18  '$ Temp: 98.2  F (36.8 C)  98 F (36.7 C) (!) 97.5 F (36.4 C)  TempSrc: Oral  Oral Oral  SpO2: 96%  96% 97%  Weight:  100 kg     Filed Weights   03/19/22 0341  Weight: 100 kg   Exam: General exam: Appears comfortable  HEENT: oral mucosa moist Respiratory system: Clear to auscultation.  Cardiovascular system: S1 & S2 heard  Gastrointestinal system: Abdomen soft, non-tender, nondistended. Normal bowel sounds   Extremities: No cyanosis, clubbing or edema Psychiatry:  Mood & affect appropriate.    Imaging and lab data was personally reviewed    CBC: Recent Labs  Lab 03/17/22 2005 03/18/22 1150 03/19/22 0327  WBC 15.7* 13.1* 10.6*  NEUTROABS 12.9*  --   --   HGB 15.1 13.4 12.0*  HCT 42.5 38.2* 33.4*  MCV 94.9 96.5 94.6  PLT 340 263 332   Basic Metabolic Panel: Recent Labs  Lab 03/14/22 1523 03/17/22 2005 03/18/22 1150 03/19/22 0327  NA 136 134* 136 135  K 4.2 3.4* 3.5 3.1*  CL 100 100 105 103  CO2 25 20* 21* 22  GLUCOSE 95 133* 115* 103*  BUN '20 20 18 15  '$ CREATININE 0.98 0.85 0.64 0.70  CALCIUM 9.7 9.3 8.8* 8.8*  MG  --   --  1.8 1.7   GFR: Estimated Creatinine Clearance: 99.2 mL/min (by  C-G formula based on SCr of 0.7 mg/dL).  Scheduled Meds:  amLODipine  5 mg Oral Daily   atorvastatin  20 mg Oral Daily   diphenoxylate-atropine  5 mL Oral Once   enoxaparin (LOVENOX) injection  40 mg Subcutaneous Q24H   finasteride  5 mg Oral Daily   losartan  100 mg Oral Daily   sertraline  100 mg Oral Daily   tamsulosin  0.4 mg Oral Daily   Continuous Infusions:  cefTRIAXone (ROCEPHIN)  IV 1 g (03/19/22 1241)     LOS: 1 day   Author: Debbe Odea  03/19/2022 3:08 PM

## 2022-03-19 NOTE — NC FL2 (Signed)
[place] LEVEL OF CARE SCREENING TOOL     IDENTIFICATION  Patient Name: Calvin [name] Birthdate: March 06, 1946 Sex: male Admission Date (Current Location): 03/17/2022  [facility] and [place] Number:  Herbalist and Address:  [facility] [place] [address], [place], [place] [address]      Provider Number: [id number]  Attending Physician Name and Address:  Debbe Odea, MD  Relative Name and Phone Number:       Current Level of Care:   Recommended Level of Care: [place] Prior Approval Number:    Date Approved/Denied:   PASRR Number: [id number]  Discharge Plan: SNF    Current Diagnoses: Patient Active Problem List   Diagnosis Date Noted   Bladder outflow obstruction 03/18/2022   Dehydration 03/18/2022   UTI (urinary tract infection) due to urinary indwelling Foley catheter ([place]) 03/18/2022   Acute pyelonephritis 02/25/2022   Pyelonephritis 02/25/2022   B12 deficiency 02/06/2022   Bilateral leg weakness 01/10/2022   Depression with anxiety 12/25/2021   Alcohol abuse 12/25/2021   Low back pain 06/09/2020   COVID-19 virus infection 02/24/2020   Primary osteoarthritis of left knee 04/23/2018   Hypogonadism in male 01/19/2016   Foot swelling 01/19/2016   Insomnia 10/30/2013   INTERNAL HEMORRHOIDS WITH OTHER COMPLICATION 02/63/7858   Enlarged prostate 10/25/2008   NEPHROLITHIASIS, HX OF 10/25/2008   Headache 09/15/2007   Essential hypertension 07/07/2007    Orientation RESPIRATION BLADDER Height & Weight     Self, Time, Situation, Place  Normal Indwelling catheter Weight: 100 kg Height:     BEHAVIORAL SYMPTOMS/MOOD NEUROLOGICAL BOWEL NUTRITION STATUS      Continent Diet  AMBULATORY STATUS COMMUNICATION OF NEEDS Skin   Extensive Assist Verbally Normal                       Personal Care Assistance Level of Assistance  Bathing, Feeding, Dressing Bathing Assistance: Limited assistance Feeding  assistance: Independent Dressing Assistance: Limited assistance     Functional Limitations Info  Sight, Hearing, Speech Sight Info: Impaired (wears glasses) Hearing Info: Impaired (wears hearing aides) Speech Info: Adequate    SPECIAL CARE FACTORS FREQUENCY  PT (By licensed PT), OT (By licensed OT)     PT Frequency: 3-5 x per week OT Frequency: 3-5 x per week            Contractures Contractures Info: Not present    Additional Factors Info  Code Status, Allergies Code Status Info: Full code Allergies Info: NKDA Psychotropic Info: Zoloft         Current Medications (03/19/2022):  This is the current hospital active medication list Current Facility-Administered Medications  Medication Dose Route Frequency Provider Last Rate Last Admin   acetaminophen (TYLENOL) tablet 650 mg  650 mg Oral Q6H PRN Opyd, Ilene Qua, MD       Or   acetaminophen (TYLENOL) suppository 650 mg  650 mg Rectal Q6H PRN Opyd, Ilene Qua, MD       amLODipine (NORVASC) tablet 5 mg  5 mg Oral Daily Opyd, Ilene Qua, MD   5 mg at 03/19/22 1012   atorvastatin (LIPITOR) tablet 20 mg  20 mg Oral Daily Opyd, Ilene Qua, MD   20 mg at 03/19/22 1012   cefTRIAXone (ROCEPHIN) 1 g in sodium chloride 0.9 % 100 mL IVPB  1 g Intravenous Q24H Opyd, Ilene Qua, MD 200 mL/hr at 03/19/22 1241 1 g at 03/19/22 1241   enoxaparin (  LOVENOX) injection 40 mg  40 mg Subcutaneous Q24H Opyd, Ilene Qua, MD   40 mg at 03/19/22 0603   finasteride (PROSCAR) tablet 5 mg  5 mg Oral Daily Opyd, Ilene Qua, MD   5 mg at 03/19/22 1013   losartan (COZAAR) tablet 100 mg  100 mg Oral Daily Opyd, Ilene Qua, MD   100 mg at 03/19/22 1012   oxyCODONE (Oxy IR/ROXICODONE) immediate release tablet 5 mg  5 mg Oral Q4H PRN Opyd, Ilene Qua, MD       sertraline (ZOLOFT) tablet 100 mg  100 mg Oral Daily Opyd, Ilene Qua, MD   100 mg at 03/19/22 1012   tamsulosin (FLOMAX) capsule 0.4 mg  0.4 mg Oral Daily Opyd, Ilene Qua, MD   0.4 mg at 03/19/22 1012      Discharge Medications: Please see discharge summary for a list of discharge medications.  Relevant Imaging Results:  Relevant Lab Results:   Additional Information SS# [id number]  Curlene Labrum, RN

## 2022-03-19 NOTE — Evaluation (Signed)
Physical Therapy Evaluation Patient Details Name: Calvin [name] [name]: [id number] DOB: 06-Aug-1945 Today's Date: 03/19/2022  History of Present Illness  76 yo male [facility] with decreased urine output with known UTI currently being treated, has had catheter placed since 9/9. Recent admission 9/9-9/15/2023 for bilat hydronephrosis, Recent CT demonstrated prostatic hypertrophy with possible mass. PMH of BPH, HTN, anxiety, depression.  Clinical Impression   Pt presents with generalized weakness, impaired gait with shuffling steps, impaired activity tolerance, impaired balance with history of falls as recently as days ago. Pt to benefit from acute PT to address deficits. Pt ambulated short room distance before fatiguing and feeling like LLE was going to "give way", pt reports he has felt very weak since being home from SNF, which pt endorses was about 3 days. Pt lives alone and has limited social support, recommend SNF. PT to progress mobility as tolerated, and will continue to follow acutely.         Recommendations for follow up therapy are one component of a multi-disciplinary discharge planning process, led by the attending physician.  Recommendations may be updated based on patient status, additional functional criteria and insurance authorization.  Follow Up Recommendations Skilled nursing-short term rehab (<3 hours/day) Can patient physically be transported by private vehicle: No (no family support in the area)    Assistance Recommended at Discharge Intermittent Supervision/Assistance  Patient can return home with the following  A little help with walking and/or transfers;A little help with bathing/dressing/bathroom;Assistance with cooking/housework;Direct supervision/assist for medications management;Assist for transportation;Help with stairs or ramp for entrance    Equipment Recommendations Rolling walker (2 wheels)  Recommendations for Other Services       Functional  Status Assessment Patient has had a recent decline in their functional status and demonstrates the ability to make significant improvements in function in a reasonable and predictable amount of time.     Precautions / Restrictions Precautions Precautions: Fall Precaution Comments: reports fallingat home in the last few days, states he was able to get self back up Restrictions Weight Bearing Restrictions: No      Mobility  Bed Mobility Overal bed mobility: Needs Assistance Bed Mobility: Sit to Supine     Supine to sit: Mod assist     General bed mobility comments: OOB upon PT arrival to room, mod assist for LE lift back into bed and truncal positioning    Transfers Overall transfer level: Needs assistance Equipment used: Rolling walker (2 wheels) Transfers: Sit to/from Stand Sit to Stand: Min assist           General transfer comment: initial rise assist, cues for porper hand placement when rising as pt tends to pull up with boths hands on RW    Ambulation/Gait Ambulation/Gait assistance: Min assist Gait Distance (Feet): 12 Feet Assistive device: Rolling walker (2 wheels) Gait Pattern/deviations: Decreased stride length, Decreased step length - right, Shuffle, Step-through pattern, Decreased weight shift to right Gait velocity: decr     General Gait Details: assist to steady, guide RW especially during directional changes. Cues for upright posture, proximity to [employer] Mobility    Modified Rankin (Stroke Patients Only)       Balance Overall balance assessment: Needs assistance, History of Falls Sitting-balance support: No upper extremity supported, Feet supported Sitting balance-Leahy Scale: Fair     Standing balance support: Bilateral upper extremity supported, During functional activity Standing balance-Leahy Scale: Poor Standing  balance comment: reliant on external support                              Pertinent Vitals/Pain Pain Assessment Pain Assessment: Faces Faces Pain Scale: Hurts little more Pain Location: R ankle Pain Descriptors / Indicators: Discomfort, Grimacing Pain Intervention(s): Limited activity within patient's tolerance, Monitored during session, Repositioned    Home Living Family/patient expects to be discharged to:: Private residence Living Arrangements: Alone Available Help at Discharge: Personal care attendant (pt reports she is a retired Engineer, production, can stay nearly all day if needed) Type of Home: House Home Access: Stairs to enter Entrance Stairs-Rails: Right Entrance Stairs-Number of Steps: 3, but spread out   Home Layout: One level;Laundry or work area in [place]: Kasandra Knudsen - single Barista (2 wheels);Grab bars - tub/shower      Prior Function Prior Level of Function : Needs assist             Mobility Comments: pt reports using RW since d/c from SNF, states he has had one fall in the past week where he slid down the wall to the floor and was able to get self up ADLs Comments: Pt reports independence, but was "too weak to do it all" once home from SNF. Pt was home x3 days from SNF before readmission to hospital     Hand Dominance   Dominant Hand: Right    Extremity/Trunk Assessment   Upper Extremity Assessment Upper Extremity Assessment: Defer to OT evaluation    Lower Extremity Assessment Lower Extremity Assessment: Generalized weakness    Cervical / Trunk Assessment Cervical / Trunk Assessment: Kyphotic  Communication   Communication: HOH  Cognition Arousal/Alertness: Awake/alert Behavior During Therapy: WFL for tasks assessed/performed Overall Cognitive Status: Within Functional Limits for tasks assessed                                          General Comments      Exercises     Assessment/Plan    PT Assessment Patient needs continued PT services  PT Problem List Decreased  strength;Decreased activity tolerance;Decreased balance;Decreased mobility;Decreased knowledge of use of DME;Decreased knowledge of precautions;Decreased safety awareness;Pain       PT Treatment Interventions DME instruction;Gait training;Stair training;Functional mobility training;Therapeutic activities;Therapeutic exercise;Balance training;Patient/family education    PT Goals (Current goals can be found in the Care Plan section)  Acute Rehab PT Goals PT Goal Formulation: With patient Time For Goal Achievement: 04/02/22 Potential to Achieve Goals: Good    Frequency Min 3X/week     Co-evaluation               AM-PAC PT "6 Clicks" Mobility  Outcome Measure Help needed turning from your back to your side while in a flat bed without using bedrails?: A Little Help needed moving from lying on your back to sitting on the side of a flat bed without using bedrails?: A Little Help needed moving to and from a bed to a chair (including a wheelchair)?: A Little Help needed standing up from a chair using your arms (e.g., wheelchair or bedside chair)?: A Little Help needed to walk in hospital room?: A Lot Help needed climbing 3-5 steps with a railing? : A Lot 6 Click Score: 16    End of Session   Activity Tolerance: Patient tolerated treatment well  Patient left: with call bell/phone within reach;in bed;with bed alarm set Nurse Communication: Mobility status PT Visit Diagnosis: Muscle weakness (generalized) (M62.81);Difficulty in walking, not elsewhere classified (R26.2);Unsteadiness on feet (R26.81);History of falling (Z91.81)    Time: 7893-8101 PT Time Calculation (min) (ACUTE ONLY): 18 min   Charges:   PT Evaluation $PT Eval Low Complexity: 1 Low          Nairi Oswald S, PT DPT Acute Rehabilitation Services Pager [phone]  Office [phone]   Sarah Ann E Ruffin Pyo 03/19/2022, 10:26 AM

## 2022-03-19 NOTE — Evaluation (Signed)
Occupational Therapy Evaluation Patient Details Name: Calvin [name] [name]: [id number] DOB: 1945-10-09 Today's Date: 03/19/2022   History of Present Illness 76 yo male presents to [facility] on 9/30 with decreased urine output with known UTI currently being treated, has had catheter placed since 9/9. Recent admission 9/9-9/15/2023 for bilat hydronephrosis, Recent CT demonstrated prostatic hypertrophy with possible mass. PMH of BPH, HTN, anxiety, depression.   Clinical Impression   Pt states he had been at a SNF for rehab for only 1 week prior to DC home with hired help. Prior to his recent hospitalization, Calvin [name] was living alone independently. Currently requires mod A for mobility and ADL tasks aA RW level and would benefit from rehab at SNF to maximize functional level of independence to facilitate safe DC home.  Pt with bleeding around foley insertion - nsg alerted. Acute OT to follow.      Recommendations for follow up therapy are one component of a multi-disciplinary discharge planning process, led by the attending physician.  Recommendations may be updated based on patient status, additional functional criteria and insurance authorization.   Follow Up Recommendations  Skilled nursing-short term rehab (<3 hours/day)    Assistance Recommended at Discharge Frequent or constant Supervision/Assistance  Patient can return home with the following A lot of help with walking and/or transfers;A lot of help with bathing/dressing/bathroom;Direct supervision/assist for medications management;Direct supervision/assist for financial management;Assist for transportation;Help with stairs or ramp for entrance    Functional Status Assessment  Patient has had a recent decline in their functional status and demonstrates the ability to make significant improvements in function in a reasonable and predictable amount of time.  Equipment Recommendations  BSC/3in1    Recommendations for Other Services        Precautions / Restrictions Precautions Precautions: Fall Precaution Comments: reports fallingat home in the last few days, states he was able to get self back up Restrictions Weight Bearing Restrictions: No      Mobility Bed Mobility   Bed Mobility: Supine to Sit, Sit to Supine     Supine to sit: Mod assist Sit to supine: Min assist        Transfers Overall transfer level: Needs assistance   Transfers: Sit to/from Stand Sit to Stand: Mod assist           General transfer comment: significant lifting assistance provided      Balance     Sitting balance-Leahy Scale: Fair       Standing balance-Leahy Scale: Poor                             ADL either performed or assessed with clinical judgement   ADL Overall ADL's : Needs assistance/impaired     Grooming: Set up   Upper Body Bathing: Min guard   Lower Body Bathing: Moderate assistance   Upper Body Dressing : Set up   Lower Body Dressing: Moderate assistance;Sit to/from stand   Toilet Transfer: Moderate assistance   Toileting- Clothing Manipulation and Hygiene: Total assistance Toileting - Clothing Manipulation Details (indicate cue type and reason): foley     Functional mobility during ADLs: Moderate assistance;Rolling walker (2 wheels)       Vision   Vision Assessment?:  (wears glasses)     Perception     Praxis      Pertinent Vitals/Pain Pain Assessment Pain Assessment: Faces Faces Pain Scale: Hurts little more Pain Location: R ankle Pain Descriptors / Indicators: Discomfort,  Grimacing Pain Intervention(s): Limited activity within patient's tolerance     Hand Dominance Right   Extremity/Trunk Assessment Upper Extremity Assessment Upper Extremity Assessment: Generalized weakness   Lower Extremity Assessment Lower Extremity Assessment: Defer to PT evaluation   Cervical / Trunk Assessment Cervical / Trunk Assessment: Kyphotic   Communication  Communication Communication: HOH   Cognition Arousal/Alertness: Awake/alert Behavior During Therapy: WFL for tasks assessed/performed Overall Cognitive Status: No family/caregiver present to determine baseline cognitive functioning                                 General Comments: slower processing; will further assess     General Comments  bleeding from penis @ foley - nsg notified    Exercises Other Exercises Other Exercises: encouraged BLE bed level exercise   Shoulder Instructions      Home Living Family/patient expects to be discharged to:: Private residence Living Arrangements: Alone Available Help at Discharge: Personal care attendant (pt reports she is a retired Engineer, production, can stay nearly all day if needed) Type of Home: House Home Access: Stairs to enter [employer] of Steps: 3, but spread out Entrance Stairs-Rails: Right Home Layout: One level;Laundry or work area in basement     [employer] Shower/Tub: Teacher, early years/pre: Handicapped height Bathroom Accessibility: Yes   Home Equipment: [place] - single Barista (2 wheels);Grab bars - tub/shower          Prior Functioning/Environment Prior Level of Function : Needs assist             Mobility Comments: pt reports using RW since d/c from SNF, states he has had one fall in the past week where he slid down the wall to the floor and was able to get self up ADLs Comments: Pt reports independence, but was "too weak to do it all" once home from SNF. Pt was home x3 days from SNF before readmission to hospital; had hired someone to help after DC form SNF but she got sick and he was trying to "do it on his own"        OT Problem List: Decreased strength;Decreased activity tolerance;Impaired balance (sitting and/or standing);Decreased safety awareness;Obesity;Pain      OT Treatment/Interventions: Self-care/ADL training;Therapeutic exercise;Energy conservation;DME and/or  AE instruction;Therapeutic activities;Patient/family education;Balance training    OT Goals(Current goals can be found in the care plan section) Acute Rehab OT Goals Patient Stated Goal: To get stronger OT Goal Formulation: With patient Time For Goal Achievement: 04/02/22 Potential to Achieve Goals: Good  OT Frequency: Min 2X/week    Co-evaluation              AM-PAC OT "6 Clicks" Daily Activity     Outcome Measure Help from another person eating meals?: None Help from another person taking care of personal grooming?: A Little Help from another person toileting, which includes using toliet, bedpan, or urinal?: Total Help from another person bathing (including washing, rinsing, drying)?: A Lot Help from another person to put on and taking off regular upper body clothing?: A Little Help from another person to put on and taking off regular lower body clothing?: A Lot 6 Click Score: 15   End of Session Equipment Utilized During Treatment: Gait belt;Rolling walker (2 wheels) Nurse Communication:  (bleeding @ foley)  Activity Tolerance: Patient tolerated treatment well Patient left: in bed;with call bell/phone within reach;with bed alarm set  OT Visit Diagnosis: Unsteadiness on  feet (R26.81);Other abnormalities of gait and mobility (R26.89);Repeated falls (R29.6);Muscle weakness (generalized) (M62.81);Pain Pain - Right/Left: Left Pain - part of body: Ankle and joints of foot                Time: 1300-1320 OT Time Calculation (min): 20 min Charges:  OT General Charges $OT Visit: 1 Visit OT Evaluation $OT Eval Moderate Complexity: [place], OT/L   Acute OT Clinical Specialist Acute Rehabilitation Services Pager [phone] Office [phone]   [facility] 03/19/2022, 1:29 PM

## 2022-03-20 DIAGNOSIS — T83511A Infection and inflammatory reaction due to indwelling urethral catheter, initial encounter: Secondary | ICD-10-CM | POA: Diagnosis not present

## 2022-03-20 DIAGNOSIS — N320 Bladder-neck obstruction: Secondary | ICD-10-CM | POA: Diagnosis not present

## 2022-03-20 DIAGNOSIS — E860 Dehydration: Secondary | ICD-10-CM | POA: Diagnosis not present

## 2022-03-20 DIAGNOSIS — E876 Hypokalemia: Secondary | ICD-10-CM | POA: Diagnosis not present

## 2022-03-20 LAB — CBC
HCT: 34.6 % — ABNORMAL LOW (ref 39.0–52.0)
Hemoglobin: 12.7 g/dL — ABNORMAL LOW (ref 13.0–17.0)
MCH: 34.6 pg — ABNORMAL HIGH (ref 26.0–34.0)
MCHC: 36.7 g/dL — ABNORMAL HIGH (ref 30.0–36.0)
MCV: 94.3 fL (ref 80.0–100.0)
Platelets: 229 10*3/uL (ref 150–400)
RBC: 3.67 MIL/uL — ABNORMAL LOW (ref 4.22–5.81)
RDW: 11.9 % (ref 11.5–15.5)
WBC: 7.6 10*3/uL (ref 4.0–10.5)
nRBC: 0 % (ref 0.0–0.2)

## 2022-03-20 LAB — BASIC METABOLIC PANEL
Anion gap: 9 (ref 5–15)
BUN: 12 mg/dL (ref 8–23)
CO2: 23 mmol/L (ref 22–32)
Calcium: 8.8 mg/dL — ABNORMAL LOW (ref 8.9–10.3)
Chloride: 103 mmol/L (ref 98–111)
Creatinine, Ser: 0.7 mg/dL (ref 0.61–1.24)
GFR, Estimated: >60 mL/min (ref >60)
Glucose, Bld: 91 mg/dL (ref 70–99)
Potassium: 3.6 mmol/L (ref 3.5–5.1)
Sodium: 135 mmol/L (ref 135–145)

## 2022-03-20 MED ORDER — POTASSIUM CHLORIDE CRYS ER 20 MEQ PO TBCR
40.0000 meq | EXTENDED_RELEASE_TABLET | ORAL | Status: AC
  Administered 2022-03-20 (×2): 40 meq via ORAL
  Filled 2022-03-20 (×2): qty 2

## 2022-03-20 MED ORDER — CHLORHEXIDINE GLUCONATE CLOTH 2 % EX PADS
6.0000 | MEDICATED_PAD | Freq: Every day | CUTANEOUS | Status: DC
  Administered 2022-03-20 – 2022-03-21 (×2): 6 via TOPICAL

## 2022-03-20 NOTE — TOC Progression Note (Signed)
Transition of Care [facility]) - Progression Note    Patient Details  Name: Calvin [name] [name]: [id number] Date of Birth: Sep 27, 1945  Transition of Care [facility] CM/SW [place], RN Phone Number: 03/20/2022, 1:47 PM  Clinical Narrative:    CM called and left a voicemail message with Ebony Hail, [place] with [facility] to follow up regarding insurance authorization started yesterday by the facility.   Expected Discharge Plan: [place] Barriers to Discharge: Continued Medical Work up  Expected Discharge Plan and Services Expected Discharge Plan: [place]   Discharge Planning Services: CM Consult Post Acute Care Choice: [place] Living arrangements for the past 2 months: Single Family Home                                       Social Determinants of Health (SDOH) Interventions    Readmission Risk Interventions    03/19/2022    2:17 PM 02/27/2022    2:30 PM  Readmission Risk Prevention Plan  Post Dischage Appt Complete Complete  Medication Screening Complete Complete  Transportation Screening Complete Complete

## 2022-03-20 NOTE — Progress Notes (Signed)
[facility] Progress Note  Patient: Calvin [name]     OJJ:[id number]  DOA: 03/17/2022   PCP: Laurey Morale, MD       Brief hospital course: 76 y/o with HTN, BPH with a foley cath present for no urine output   on 9/29. His catheter was changed in the urology office 3 days prior to this episode.    He was admitted from 9/9-9/15 for AKI, b/l hydronephrosis, pyelonephritis and acute urinary retention. He had a foley cath placed, finised a course of antibiotics and was recommended to f/u with urology as outpt. He has an appt next month.He was discharged to a SNF and was later discharged home.    In the ED: found to have an obstructed foley which was replaced WBC 15.7 Sodium 134 K 3.4 Bicarb 20 UA consistent with a UTI  Subjective:  Loose stools improved after Lomotil and Imodium. He is worried about which facility he will be transferring to.   Assessment and Plan: Principal Problem:   UTI (urinary tract infection) due to urinary indwelling Foley catheter ([place]) - in setting of urinary obstruction- BPH - urine culture> multiple morphotypes  - cont Ceftriaxone (started 10/1) - cont  Proscar and Flomax - WBC 7.6   Active Problems: Loose stools - having 1-2 loose BMs- this was severe when last admitted but did not resolve completely - abdominal xray unrevealing - Given a dose of Lomotil and a dose of Imodium yesterday - have asked him to hold off on taking more doses for today  Hypokalemia - replaced - K 3.4> 3.5> 3.1> 3.6 - cont to replace    Dehydration/ hyponatremia - given IVF - HCTZ on hold  Metabolic acidosis - resolved     Essential hypertension - Amlodipine, Losartan  HOH    Deconditioning - He feels that he is weaker now than he was during the last admission - he lives alone - will need SNF- he is in agreement- he went to a SNF in [place] on 03/02/22 for about 1 wk prior to going home     Code Status: Full Code DVT prophylaxis:  enoxaparin  (LOVENOX) injection 40 mg Start: 03/18/22 0530  Consultants: none Level of Care: Level of care: Med-Surg  Objective:   Vitals:   03/20/22 0434 03/20/22 0438 03/20/22 0751 03/20/22 0834  BP:  (!) 144/99 (!) 92/59 115/80  Pulse:  85 95 87  Resp:  18 16   Temp:  98 F (36.7 C) (!) 97.5 F (36.4 C)   TempSrc:  Oral Oral   SpO2:  96% 96% 95%  Weight: 100 kg      Filed Weights   03/19/22 0341 03/20/22 0434  Weight: 100 kg 100 kg   Exam: General exam: Appears comfortable  HEENT: oral mucosa moist Respiratory system: Clear to auscultation.  Cardiovascular system: S1 & S2 heard  Gastrointestinal system: Abdomen soft, non-tender, nondistended. Normal bowel sounds   Extremities: No cyanosis, clubbing or edema Psychiatry:  Mood & affect appropriate.     Imaging and lab data was personally reviewed    CBC: Recent Labs  Lab 03/17/22 2005 03/18/22 1150 03/19/22 0327 03/20/22 0447  WBC 15.7* 13.1* 10.6* 7.6  NEUTROABS 12.9*  --   --   --   HGB 15.1 13.4 12.0* 12.7*  HCT 42.5 38.2* 33.4* 34.6*  MCV 94.9 96.5 94.6 94.3  PLT 340 263 239 937    Basic Metabolic Panel: Recent Labs  Lab 03/14/22 1523 03/17/22 2005 03/18/22  1150 03/19/22 0327 03/20/22 0447  NA 136 134* 136 135 135  K 4.2 3.4* 3.5 3.1* 3.6  CL 100 100 105 103 103  CO2 25 20* 21* 22 23  GLUCOSE 95 133* 115* 103* 91  BUN '20 20 18 15 12  '$ CREATININE 0.98 0.85 0.64 0.70 0.70  CALCIUM 9.7 9.3 8.8* 8.8* 8.8*  MG  --   --  1.8 1.7  --     GFR: Estimated Creatinine Clearance: 99.2 mL/min (by C-G formula based on SCr of 0.7 mg/dL).  Scheduled Meds:  amLODipine  5 mg Oral Daily   atorvastatin  20 mg Oral Daily   Chlorhexidine Gluconate Cloth  6 each Topical Daily   enoxaparin (LOVENOX) injection  40 mg Subcutaneous Q24H   finasteride  5 mg Oral Daily   losartan  100 mg Oral Daily   sertraline  100 mg Oral Daily   tamsulosin  0.4 mg Oral Daily   Continuous Infusions:  cefTRIAXone (ROCEPHIN)  IV 1 g  (03/20/22 1229)     LOS: 2 days   Author: Debbe Odea  03/20/2022 1:47 PM

## 2022-03-20 NOTE — Consult Note (Signed)
   [facility] Inpatient Consult   03/20/2022  Calvin [name] 1946-02-08 [id number]  [facility] [ACO] Patient: Calvin [name]  Primary Care Provider:  Laurey Morale, MD with [facility] which has access to a Care Coordination team and program, and is listed to provide the Transition of Care follow up *Patient noted to have a active Chronic Care Management service with [facility] per Care Teams list.   Patient screened for less than 30 days readmission hospitalization in the [facility] system. Reviewed to assess for potential [place] Management service needs for post hospital transition.  Review of patient's medical record reveals patient is recently from [place] rehab and PT/OT notes ongoing skilled nursing facility needs for rehab and noted limited family support. Discussed with inpatient [facility] RNCM for transition of care needs.  Went to the bedside to speak with patient and he is fast asleep in bed in a fowlers position, respirations are even and non-labored at the time of this visit.   Plan:  Continue to follow progress and disposition to assess for post hospital care management needs.  This Probation officer can alert the [facility] RN if patient transitions to a [facility] affiliated facility, as well as pending Biochemist, clinical.    For questions contact:   Natividad Brood, RN BSN [facility] Liaison  [phone] business mobile phone Toll free office [phone]  Fax number: [phone] [email or web address]

## 2022-03-21 DIAGNOSIS — N320 Bladder-neck obstruction: Secondary | ICD-10-CM | POA: Diagnosis not present

## 2022-03-21 DIAGNOSIS — T83511A Infection and inflammatory reaction due to indwelling urethral catheter, initial encounter: Secondary | ICD-10-CM | POA: Diagnosis not present

## 2022-03-21 DIAGNOSIS — I10 Essential (primary) hypertension: Secondary | ICD-10-CM | POA: Diagnosis not present

## 2022-03-21 DIAGNOSIS — E860 Dehydration: Secondary | ICD-10-CM | POA: Diagnosis not present

## 2022-03-21 LAB — CBC
HCT: 38.5 % — ABNORMAL LOW (ref 39.0–52.0)
Hemoglobin: 13.7 g/dL (ref 13.0–17.0)
MCH: 33.3 pg (ref 26.0–34.0)
MCHC: 35.6 g/dL (ref 30.0–36.0)
MCV: 93.4 fL (ref 80.0–100.0)
Platelets: 269 10*3/uL (ref 150–400)
RBC: 4.12 MIL/uL — ABNORMAL LOW (ref 4.22–5.81)
RDW: 11.9 % (ref 11.5–15.5)
WBC: 8.8 10*3/uL (ref 4.0–10.5)
nRBC: 0 % (ref 0.0–0.2)

## 2022-03-21 LAB — BASIC METABOLIC PANEL
Anion gap: 10 (ref 5–15)
BUN: 12 mg/dL (ref 8–23)
CO2: 24 mmol/L (ref 22–32)
Calcium: 9.1 mg/dL (ref 8.9–10.3)
Chloride: 99 mmol/L (ref 98–111)
Creatinine, Ser: 0.73 mg/dL (ref 0.61–1.24)
GFR, Estimated: >60 mL/min (ref >60)
Glucose, Bld: 102 mg/dL — ABNORMAL HIGH (ref 70–99)
Potassium: 3.7 mmol/L (ref 3.5–5.1)
Sodium: 133 mmol/L — ABNORMAL LOW (ref 135–145)

## 2022-03-21 MED ORDER — CEPHALEXIN 500 MG PO CAPS: 500.0000 mg | ORAL_CAPSULE | Freq: Three times a day (TID) | ORAL | 0 refills | Status: AC

## 2022-03-21 MED ORDER — LIDOCAINE HCL URETHRAL/MUCOSAL 2 % EX GEL
1.0000 | Freq: Once | CUTANEOUS | Status: AC
  Administered 2022-03-21: 1 via URETHRAL
  Filled 2022-03-21: qty 6

## 2022-03-21 MED ORDER — LOPERAMIDE HCL 2 MG PO CAPS: 2.0000 mg | ORAL_CAPSULE | Freq: Three times a day (TID) | ORAL | 0 refills | Status: DC | PRN

## 2022-03-21 NOTE — TOC Transition Note (Addendum)
Transition of Care [facility]) - CM/SW Discharge Note   Patient Details  Name: Calvin [name] [name]: [id number] Date of Birth: [birth year]  Transition [facility]) CM/SW Contact:  Curlene Labrum, RN Phone Number: 03/21/2022, 11:01 AM   Clinical Narrative:    Patient is medically stable to discharge to [facility] today.  I met with the patient and called the patient's daughter this morning and they are aware that patient will discharge to the facility today via ambulance.  Discharge summary and transfer report were uploaded in the hub - [facility] [place] [facility] is aware and bed is available at the facility.  The facility received insurance authorization for placement.  PTAR was called and transportation was arranged to the facility.  PTAR packet to include facesheet, medical necessity, discharge summary and AVS report.  Bedside nursing - please call report to [facility] rehabilitation SNF at [phone], Room 509-A.  03/21/2022 1405 - PTAR arrived and bedside nurse states that the patient's foley is not draining urine - bladder scan showing >900 cc urine.  MD notified and foley to be replaced by bedside nursing.  [facility] SNF aware and awaiting determination of foley replacement before PTAR recalled back to transport.  03/21/2022 1500 - Bedside nursing placed a new foley and 2600 cc emptied out of bladder.  Dr. Jeffie Pollock will see the patient today.  The patient will be discharged by PTAR to the facility tomorrow.  The patient, family and facility are all aware.   Final next level of care: Skilled Nursing Facility Barriers to Discharge: Continued Medical Work up   Patient Goals and CMS Choice Patient states their goals for this hospitalization and ongoing recovery are:: To go to rehab CMS Medicare.gov Compare Post Acute Care list provided to:: Patient Choice offered to / list presented to : Patient  Discharge Placement                        Discharge Plan and Services   Discharge Planning Services: CM Consult Post Acute Care Choice: [place]                               Social Determinants of Health (SDOH) Interventions     Readmission Risk Interventions    03/19/2022    2:17 PM 02/27/2022    2:30 PM  Readmission Risk Prevention Plan  Post Dischage Appt Complete Complete  Medication Screening Complete Complete  Transportation Screening Complete Complete

## 2022-03-21 NOTE — Progress Notes (Signed)
54 RN into room to take out IV and empty foley prior to D/C. Pt only had about 10cc of pink tinged urine in foley bag. RN asked NT if she had emptied it earlier but she didn't. RN assessed pt's abdomen and pelvis, pt grimaced when pressed on bladder.   [place] bladder scanned pt and greater than 933m revealed on bladder scan. Call placed to MD.   1300 RN received order to irrigate foley.   1300 RNs x2 attempted and only able to flush NS, unable to pull back any clots or urine. Pt now in pain and uncomfortable in pelvic area. MD paged.   1330 Order obtained by urology to change foley and irrigate.   [place] and 26052mof pink tinged, blood clots, urine noted. Pt with relief of bladder but complains of pain in tip of his penis. RN attempted to again irrigate bladder and RN able to push in NS but unable to pull back any urine. Bloody discharged noted at tip of penis, peri and foley care performed.   1564D and urology notified. Urology will be around to see patient and attending MD will hold off on D/C until seen by urology.

## 2022-03-21 NOTE — Care Management Important Message (Signed)
Important Message  Patient Details  Name: Calvin [name] [name]: [id number] Date of Birth: 01/02/1946   Medicare Important Message Given:  Yes     Orbie Pyo 03/21/2022, 3:03 PM

## 2022-03-21 NOTE — Progress Notes (Addendum)
Patient ID: [name], male   DOB: August 12, 1945, 76 y.o.   [name]: [id number]  Consult requested by Dr. Starla Link   I was called about Mr Calvin [name] and his obstructed foley from clots.  It was replaced with a larger catheter and irrigated with a return of 2674m and clots.  The foley is now draining well and the urine is clear.   He has f/u scheduled in our office with Dr. PClaudia Desanctisand should go home with the foley and keep that f/u appointment.    BP (!) 119/90 (BP Location: Left Arm)   Pulse 91   Temp 97.7 F (36.5 C) (Oral)   Resp 18   Wt 100 kg   SpO2 96%   BMI 29.90 kg/m   I have reviewed the chart and our office notes as well as his prior CT.  He was not arousable when I came by so I was unable to obtain additional history.  Imp: BPH with BOO and a history of retention and hydronephrosis now a recently dislodged foley with clot retention.   He is doing well s/p replacement and irrigation of the foley.    Rec: He will need to go home with the foley and follow as planned with Dr. PClaudia Desanctis   CC: Dr. KAline August

## 2022-03-21 NOTE — Discharge Summary (Signed)
Physician Discharge Summary  Calvin [name]:[id number] DOB: 02/11/1946 DOA: 03/17/2022  PCP: Laurey Morale, MD  Admit date: 03/17/2022 Discharge date: 03/22/2022  Admitted From: Home Disposition: SNF  Recommendations for Outpatient Follow-up:  Follow up with PCP in 1 week with repeat CBC/BMP Outpatient follow-up with urology Follow up in ED if symptoms worsen or new appear   Home Health: No Equipment/Devices: Foley catheter  Discharge Condition: Stable CODE STATUS: Full Diet recommendation: Heart healthy  Brief/Interim Summary: 76 year old male with history of hypertension, BPH, admission from 02/24/2022-03/02/2022 for AKI due to bilateral hydronephrosis and pyelonephritis and acute urinary retention requiring Foley catheter and antibiotic treatment presented with no urine output after his catheter was changed in the urology office 3 days prior.  On presentation, he had leukocytosis with UA consistent with UTI.  Foley catheter was replaced in the ED.  Addendum on 03/22/2022: Patient was supposed to be discharged to SNF on 03/21/2022 but discharge was held since Foley catheter stopped working patient was in discomfort.  As per the nursing staff, the Foley catheter could not be flushed and there was some blood-tinged urine in the bag.  On-call urology/Dr. Jeffie Pollock was consulted and subsequently the nursing staff changed the Foley catheter which resulted in large amount of pink-tinged urine with blood clots.  Dr. Jeffie Pollock subsequently evaluated the patient and recommended to continue Foley catheter on discharge and follow-up with urology as an outpatient.  Patient seen and examined at bedside this morning and plan of care discussed with him.  Nursing staff reports that Foley catheter is draining but still passing clots.  Urology recommended to irrigate the catheter this morning and if the catheter is functioning, patient still can be discharged to SNF with outpatient follow-up with urology.     Discharge Diagnoses:   Complicated UTI, possibly associated with indwelling Foley catheter: Present on admission Foley catheter malfunction -In the setting of urinary obstruction and BPH -Urine culture grew multiple species.  Currently on Rocephin.  Afebrile.  Hemodynamically stable.  Continue Proscar and Flomax.  Discharge patient to SNF today on oral Keflex for few more days.  Outpatient follow-up with urology as above. -Foley catheter replaced  Loose stools -Use Imodium as needed.  Abdominal x-ray unrevealing.  Hypokalemia -Improved  Hyponatremia -Mild.  Monitor as an outpatient  Dehydration/hyponatremia -Treated with IV fluids.  Hydrochlorothiazide to remain on hold  Essential hypertension--continue amlodipine and losartan.  Hydrochlorothiazide on hold  Hard of hearing -Outpatient follow-up  Metabolic acidosis -Resolved  Physical deconditioning -Will need PT eval at SNF. Discharge Instructions  Discharge Instructions     Diet - low sodium heart healthy   Complete by: As directed    Increase activity slowly   Complete by: As directed       Allergies as of 03/21/2022   No Known Allergies      Medication List     STOP taking these medications    diazepam 5 MG tablet Commonly known as: VALIUM   hydrochlorothiazide 25 MG tablet Commonly known as: HYDRODIURIL   potassium chloride 10 MEQ tablet Commonly known as: KLOR-CON M       TAKE these medications    amLODipine 5 MG tablet Commonly known as: NORVASC Take 1 tablet (5 mg total) by mouth daily.   atorvastatin 20 MG tablet Commonly known as: LIPITOR Take 20 mg by mouth daily.   BD Eclipse Syringe/Needle 25G X 5/8" 3 ML Misc Generic drug: SYRINGE-NEEDLE (DISP) 3 ML Use as directed once a week  cephALEXin 500 MG capsule Commonly known as: KEFLEX Take 1 capsule (500 mg total) by mouth 3 (three) times daily for 5 days.   cyanocobalamin 1000 MCG/ML injection Commonly known as: VITAMIN  B12 INJECT 1 ML (1,000 MCG TOTAL) INTO THE MUSCLE ONCE A WEEK.   finasteride 5 MG tablet Commonly known as: PROSCAR Take 1 tablet (5 mg total) by mouth daily.   loperamide 2 MG capsule Commonly known as: IMODIUM Take 1 capsule (2 mg total) by mouth every 8 (eight) hours as needed for diarrhea or loose stools.   losartan 100 MG tablet Commonly known as: COZAAR Take 1 tablet (100 mg total) by mouth daily.   sertraline 100 MG tablet Commonly known as: ZOLOFT TAKE 1 TABLET BY MOUTH EVERY DAY   sertraline 50 MG tablet Commonly known as: ZOLOFT Take 50 mg by mouth daily. Take with '100mg'$  tablet for a total dose of '150mg'$  daily for 30 days   tamsulosin 0.4 MG Caps capsule Commonly known as: FLOMAX Take 1 capsule (0.4 mg total) by mouth daily.        Follow-up Information     [facility]. Schedule an appointment as soon as possible for a visit in 1 week(s).   Contact information: [facility] [place]        Laurey Morale, MD. Schedule an appointment as soon as possible for a visit.   Specialty: Family Medicine Contact information: [address] [place] [address] [phone]                No Known Allergies  Consultations: None   Procedures/Studies: DG Abd 1 View  Result Date: 03/19/2022 CLINICAL DATA:  Diarrhea. EXAM: ABDOMEN - 1 VIEW COMPARISON:  January 19, 2016. FINDINGS: The bowel gas pattern is normal. No radio-opaque calculi or other significant radiographic abnormality are seen. IMPRESSION: Negative. Electronically Signed   By: Marijo Conception M.D.   On: 03/19/2022 14:52   NM BRAIN DATSCAN TUMOR LOC INFLAM SPECT 1 DAY  Result Date: 03/05/2022 CLINICAL DATA:  76 year old male with leg weakness and difficulty walking. EXAM: NUCLEAR MEDICINE BRAIN IMAGING WITH SPECT  (DaTscan ) TECHNIQUE: SPECT images of the brain were obtained after intravenous injection of radiopharmaceutical. 4  hour post injection imaging. Appropriate positioning. 130 mg IO STAT given orally for thyroid blockade. RADIOPHARMACEUTICALS:  4.8 millicuries I 132 Ioflupane COMPARISON:  None Available. FINDINGS: The LEFT and RIGHT striatum are asymmetric. There is intense activity in the LEFT caudate nucleus and putamen. Mild tapering of the RIGHT striatum posteriorly. Findings favored within normal limits. IMPRESSION: Ioflupane scan within normal limits. No reduced radiotracer activity in basal ganglia to suggest Parkinson's syndrome pathology. Of note, DaTSCAN is not diagnostic of Parkinsonian syndromes, which remains a clinical diagnosis. DaTscan is an adjuvant test to aid in the clinical diagnosis of Parkinsonian syndromes. These results will be called to the ordering clinician or representative by the Radiologist Assistant, and communication documented in the PACS or [employer]. Electronically Signed   By: Suzy Bouchard M.D.   On: 03/05/2022 15:20   CT ABDOMEN PELVIS W CONTRAST  Result Date: 02/25/2022 CLINICAL DATA:  Diarrhea. Abdominal mass/hernia. Anion gap metabolic acidosis. Diarrhea with nausea onset last week. Emesis today and mid upper abdominal pain. EXAM: CT ABDOMEN AND PELVIS WITH CONTRAST TECHNIQUE: Multidetector CT imaging of the abdomen and pelvis was performed using the standard protocol following bolus administration of intravenous contrast. RADIATION DOSE REDUCTION: This exam  was performed according to the departmental dose-optimization program which includes automated exposure control, adjustment of the mA and/or kV according to patient size and/or use of iterative reconstruction technique. CONTRAST:  31m OMNIPAQUE IOHEXOL 350 MG/ML SOLN COMPARISON:  CT abdomen dated 01/19/2016. FINDINGS: Lower chest: Mild scarring/atelectasis at the lung bases. No acute findings. Hepatobiliary: Several hepatic cysts. No follow-up imaging is recommended for this benign finding. No acute or suspicious findings  within the liver. Gallbladder is unremarkable. No bile duct dilatation is seen. Pancreas: Unremarkable. No pancreatic ductal dilatation or surrounding inflammatory changes. Spleen: Normal in size without focal abnormality. Adrenals/Urinary Tract: Bladder is markedly distended. Prostate gland is enlarged, with the irregular thickening anteriorly, causing mass effect on the bladder base and possible extension into the bladder wall. Associated bilateral hydronephrosis, moderate to severe in degree, RIGHT greater than LEFT. Associated perinephric and periureteral inflammation/fluid stranding. No renal or ureteral calculi. LEFT renal cysts. No follow-up imaging is recommended for this benign finding. Stomach/Bowel: No dilated large or small bowel loops. No evidence of bowel wall inflammation. Appendix is not seen but there are no inflammatory changes about the cecum to suggest acute appendicitis. Stomach is unremarkable, partially decompressed. Vascular/Lymphatic: Aortic atherosclerosis. No abdominal aortic aneurysm. No acute-appearing vascular abnormality. No enlarged lymph nodes are seen in the abdomen or pelvis. Reproductive: Prostate gland is heterogeneously enlarged, as above. Can not exclude prostate extension to the overlying posterior-inferior bladder wall. Other: No abscess collection is seen.  No free intraperitoneal air. Musculoskeletal: No acute or suspicious osseous abnormality. Degenerative spondylosis scattered throughout the thoracolumbar spine, mild to moderate in degree. IMPRESSION: 1. Bladder is markedly distended. 2. Prostate gland is heterogeneously enlarged, with irregular thickening anteriorly, causing mass effect on the bladder base and possible extension into the bladder wall, and causing the severe bladder distention. 3. Associated bilateral hydronephrosis, moderate to severe in degree, RIGHT greater than LEFT, with associated perinephric and periureteral inflammation/fluid stranding. Findings  are highly suspicious for a neoplastic process. Again, can not exclude prostate neoplasm with extension to the inferior bladder wall. Recommend Urology consultation for further workup considerations. Aortic Atherosclerosis (ICD10-I70.0). Electronically Signed   By: SFranki CabotM.D.   On: 02/25/2022 10:08      Subjective: Patient seen and examined at bedside.  Hard of hearing.  Having some diarrhea.  Feels okay to be discharged to rehab today.  No fever, vomiting, seizures or agitation reported. Discharge Exam: Vitals:   03/21/22 0452 03/21/22 0750  BP: (!) 145/99 (!) 156/107  Pulse: 92 93  Resp:  18  Temp: 98.2 F (36.8 C) 97.8 F (36.6 C)  SpO2: 94% 93%    General: Pt is alert, awake, not in acute distress.  Looks chronically ill and deconditioned.  Currently on room air.  Indwelling Foley catheter present Cardiovascular: rate controlled, S1/S2 + Respiratory: bilateral decreased breath sounds at bases with some scattered crackles Abdominal: Soft, NT, ND, bowel sounds + Extremities: Trace lower extremity edema; no cyanosis    The results of significant diagnostics from this hospitalization (including imaging, microbiology, ancillary and laboratory) are listed below for reference.     Microbiology: Recent Results (from the past 240 hour(s))  Urine Culture     Status: Abnormal   Collection Time: 03/18/22  2:05 AM   Specimen: Urine, Catheterized  Result Value Ref Range Status   Specimen Description URINE, CATHETERIZED  Final   Special Requests   Final    NONE Performed at [facility] [address]  [place] [id number]    Culture (A)  Final    >=100,000 COLONIES/mL MULTIPLE SPECIES PRESENT, SUGGEST RECOLLECTION   Report Status 03/19/2022 FINAL  Final  Culture, blood (routine x 2)     Status: None (Preliminary result)   Collection Time: 03/18/22  3:40 AM   Specimen: BLOOD  Result Value Ref Range Status   Specimen Description BLOOD RIGHT ANTECUBITAL  Final    Special Requests   Final    BOTTLES DRAWN AEROBIC AND ANAEROBIC Blood Culture results may not be optimal due to an excessive volume of blood received in culture bottles   Culture   Final    NO GROWTH 3 DAYS Performed at [facility], [place] [address], [place], [place] [address]    Report Status PENDING  Incomplete  Culture, blood (routine x 2)     Status: None (Preliminary result)   Collection Time: 03/18/22  5:57 AM   Specimen: BLOOD  Result Value Ref Range Status   Specimen Description BLOOD SITE NOT SPECIFIED  Final   Special Requests   Final    BOTTLES DRAWN AEROBIC AND ANAEROBIC Blood Culture results may not be optimal due to an excessive volume of blood received in culture bottles   Culture   Final    NO GROWTH 3 DAYS Performed at [facility], [place] [address] [place], [place] [address]    Report Status PENDING  Incomplete     Labs: BNP (last 3 results) No results for input(s): "BNP" in the last 8760 hours. Basic Metabolic Panel: Recent Labs  Lab 03/17/22 2005 03/18/22 1150 03/19/22 0327 03/20/22 0447 03/21/22 0539  NA 134* 136 135 135 133*  K 3.4* 3.5 3.1* 3.6 3.7  CL 100 105 103 103 99  CO2 20* 21* '22 23 24  '$ GLUCOSE 133* 115* 103* 91 102*  BUN '20 18 15 12 12  '$ CREATININE 0.85 0.64 0.70 0.70 0.73  CALCIUM 9.3 8.8* 8.8* 8.8* 9.1  MG  --  1.8 1.7  --   --    Liver Function Tests: No results for input(s): "AST", "ALT", "ALKPHOS", "BILITOT", "PROT", "ALBUMIN" in the last 168 hours. No results for input(s): "LIPASE", "AMYLASE" in the last 168 hours. No results for input(s): "AMMONIA" in the last 168 hours. CBC: Recent Labs  Lab 03/17/22 2005 03/18/22 1150 03/19/22 0327 03/20/22 0447 03/21/22 0539  WBC 15.7* 13.1* 10.6* 7.6 8.8  NEUTROABS 12.9*  --   --   --   --   HGB 15.1 13.4 12.0* 12.7* 13.7  HCT 42.5 38.2* 33.4* 34.6* 38.5*  MCV 94.9 96.5 94.6 94.3 93.4  PLT 340 263 239 229 269   Cardiac Enzymes: No results for input(s): "CKTOTAL",  "CKMB", "CKMBINDEX", "TROPONINI" in the last 168 hours. BNP: Invalid input(s): "POCBNP" CBG: No results for input(s): "GLUCAP" in the last 168 hours. D-Dimer No results for input(s): "DDIMER" in the last 72 hours. Hgb A1c No results for input(s): "HGBA1C" in the last 72 hours. Lipid Profile No results for input(s): "CHOL", "HDL", "LDLCALC", "TRIG", "CHOLHDL", "LDLDIRECT" in the last 72 hours. Thyroid function studies No results for input(s): "TSH", "T4TOTAL", "T3FREE", "THYROIDAB" in the last 72 hours.  Invalid input(s): "FREET3" Anemia work up No results for input(s): "VITAMINB12", "FOLATE", "FERRITIN", "TIBC", "IRON", "RETICCTPCT" in the last 72 hours. Urinalysis    Component Value Date/Time   COLORURINE AMBER (A) 03/18/2022 0205   APPEARANCEUR CLOUDY (A) 03/18/2022 0205   LABSPEC 1.012 03/18/2022 0205   PHURINE 6.0 03/18/2022 0205  GLUCOSEU NEGATIVE 03/18/2022 0205   HGBUR LARGE (A) 03/18/2022 0205   HGBUR negative 12/14/2009 0823   BILIRUBINUR NEGATIVE 03/18/2022 0205   BILIRUBINUR neg 09/14/2020 1459   KETONESUR NEGATIVE 03/18/2022 0205   PROTEINUR 100 (A) 03/18/2022 0205   UROBILINOGEN 1.0 09/14/2020 1459   UROBILINOGEN 1.0 03/12/2012 1250   NITRITE NEGATIVE 03/18/2022 0205   LEUKOCYTESUR LARGE (A) 03/18/2022 0205   Sepsis Labs Recent Labs  Lab 03/18/22 1150 03/19/22 0327 03/20/22 0447 03/21/22 0539  WBC 13.1* 10.6* 7.6 8.8   Microbiology Recent Results (from the past 240 hour(s))  Urine Culture     Status: Abnormal   Collection Time: 03/18/22  2:05 AM   Specimen: Urine, Catheterized  Result Value Ref Range Status   Specimen Description URINE, CATHETERIZED  Final   Special Requests   Final    NONE Performed at [facility], [place] [address], [place], [place] [address]    Culture (A)  Final    >=100,000 COLONIES/mL MULTIPLE SPECIES PRESENT, SUGGEST RECOLLECTION   Report Status 03/19/2022 FINAL  Final  Culture, blood (routine x 2)     Status: None  (Preliminary result)   Collection Time: 03/18/22  3:40 AM   Specimen: BLOOD  Result Value Ref Range Status   Specimen Description BLOOD RIGHT ANTECUBITAL  Final   Special Requests   Final    BOTTLES DRAWN AEROBIC AND ANAEROBIC Blood Culture results may not be optimal due to an excessive volume of blood received in culture bottles   Culture   Final    NO GROWTH 3 DAYS Performed at [facility], [place] [address], [place], [place] [address]    Report Status PENDING  Incomplete  Culture, blood (routine x 2)     Status: None (Preliminary result)   Collection Time: 03/18/22  5:57 AM   Specimen: BLOOD  Result Value Ref Range Status   Specimen Description BLOOD SITE NOT SPECIFIED  Final   Special Requests   Final    BOTTLES DRAWN AEROBIC AND ANAEROBIC Blood Culture results may not be optimal due to an excessive volume of blood received in culture bottles   Culture   Final    NO GROWTH 3 DAYS Performed at [facility], [place] [address] [place], [place] [address]    Report Status PENDING  Incomplete     Time coordinating discharge: 35 minutes  SIGNED:   Aline August, MD  [facility] 03/21/2022, 10:31 AM

## 2022-03-21 NOTE — Progress Notes (Signed)
Mobility Specialist Progress Note:   03/21/22 1657  Mobility  Activity Dangled on edge of bed  Activity Response Tolerated fair  Distance Ambulated (ft) 0 ft  $Mobility charge 1 Mobility  Level of Assistance Minimal assist, patient does 75% or more  Assistive Device None  Mobility Referral Yes   Pt received in bed and agreeable. Sat EOB and had difficulties keeping upright, requiring MinA to keep balance while seated. Deferred further mobility d/t balance difficulties. Pt left in bed with all needs met, call bell in reach, and bed alarm on.   [facility] Mobility Specialist-Acute Rehab Secure Chat only

## 2022-03-22 DIAGNOSIS — N390 Urinary tract infection, site not specified: Secondary | ICD-10-CM | POA: Diagnosis not present

## 2022-03-22 DIAGNOSIS — R300 Dysuria: Secondary | ICD-10-CM | POA: Diagnosis not present

## 2022-03-22 DIAGNOSIS — S82832S Other fracture of upper and lower end of left fibula, sequela: Secondary | ICD-10-CM | POA: Diagnosis not present

## 2022-03-22 DIAGNOSIS — R338 Other retention of urine: Secondary | ICD-10-CM | POA: Diagnosis not present

## 2022-03-22 DIAGNOSIS — Z87440 Personal history of urinary (tract) infections: Secondary | ICD-10-CM | POA: Diagnosis not present

## 2022-03-22 DIAGNOSIS — N10 Acute tubulo-interstitial nephritis: Secondary | ICD-10-CM | POA: Diagnosis not present

## 2022-03-22 DIAGNOSIS — I10 Essential (primary) hypertension: Secondary | ICD-10-CM | POA: Diagnosis not present

## 2022-03-22 DIAGNOSIS — R5381 Other malaise: Secondary | ICD-10-CM | POA: Diagnosis not present

## 2022-03-22 DIAGNOSIS — Z5181 Encounter for therapeutic drug level monitoring: Secondary | ICD-10-CM | POA: Diagnosis not present

## 2022-03-22 DIAGNOSIS — I700 Atherosclerosis of aorta: Secondary | ICD-10-CM | POA: Diagnosis not present

## 2022-03-22 DIAGNOSIS — R262 Difficulty in walking, not elsewhere classified: Secondary | ICD-10-CM | POA: Diagnosis not present

## 2022-03-22 DIAGNOSIS — S82832A Other fracture of upper and lower end of left fibula, initial encounter for closed fracture: Secondary | ICD-10-CM | POA: Diagnosis not present

## 2022-03-22 DIAGNOSIS — T83511D Infection and inflammatory reaction due to indwelling urethral catheter, subsequent encounter: Secondary | ICD-10-CM | POA: Diagnosis not present

## 2022-03-22 DIAGNOSIS — Z978 Presence of other specified devices: Secondary | ICD-10-CM | POA: Diagnosis not present

## 2022-03-22 DIAGNOSIS — N3281 Overactive bladder: Secondary | ICD-10-CM | POA: Diagnosis not present

## 2022-03-22 DIAGNOSIS — Z7401 Bed confinement status: Secondary | ICD-10-CM | POA: Diagnosis not present

## 2022-03-22 DIAGNOSIS — N139 Obstructive and reflux uropathy, unspecified: Secondary | ICD-10-CM | POA: Diagnosis not present

## 2022-03-22 DIAGNOSIS — R296 Repeated falls: Secondary | ICD-10-CM | POA: Diagnosis not present

## 2022-03-22 DIAGNOSIS — R41841 Cognitive communication deficit: Secondary | ICD-10-CM | POA: Diagnosis not present

## 2022-03-22 DIAGNOSIS — N1339 Other hydronephrosis: Secondary | ICD-10-CM | POA: Diagnosis not present

## 2022-03-22 DIAGNOSIS — F339 Major depressive disorder, recurrent, unspecified: Secondary | ICD-10-CM | POA: Diagnosis not present

## 2022-03-22 DIAGNOSIS — Z9181 History of falling: Secondary | ICD-10-CM | POA: Diagnosis not present

## 2022-03-22 DIAGNOSIS — N401 Enlarged prostate with lower urinary tract symptoms: Secondary | ICD-10-CM | POA: Diagnosis not present

## 2022-03-22 DIAGNOSIS — M25572 Pain in left ankle and joints of left foot: Secondary | ICD-10-CM | POA: Diagnosis not present

## 2022-03-22 DIAGNOSIS — R69 Illness, unspecified: Secondary | ICD-10-CM | POA: Diagnosis not present

## 2022-03-22 DIAGNOSIS — R339 Retention of urine, unspecified: Secondary | ICD-10-CM | POA: Diagnosis not present

## 2022-03-22 DIAGNOSIS — N429 Disorder of prostate, unspecified: Secondary | ICD-10-CM | POA: Diagnosis not present

## 2022-03-22 DIAGNOSIS — R531 Weakness: Secondary | ICD-10-CM | POA: Diagnosis not present

## 2022-03-22 DIAGNOSIS — R2681 Unsteadiness on feet: Secondary | ICD-10-CM | POA: Diagnosis not present

## 2022-03-22 DIAGNOSIS — N320 Bladder-neck obstruction: Secondary | ICD-10-CM | POA: Diagnosis not present

## 2022-03-22 DIAGNOSIS — R278 Other lack of coordination: Secondary | ICD-10-CM | POA: Diagnosis not present

## 2022-03-22 DIAGNOSIS — R319 Hematuria, unspecified: Secondary | ICD-10-CM | POA: Diagnosis not present

## 2022-03-22 DIAGNOSIS — M19072 Primary osteoarthritis, left ankle and foot: Secondary | ICD-10-CM | POA: Diagnosis not present

## 2022-03-22 DIAGNOSIS — M79672 Pain in left foot: Secondary | ICD-10-CM | POA: Diagnosis not present

## 2022-03-22 DIAGNOSIS — S82402A Unspecified fracture of shaft of left fibula, initial encounter for closed fracture: Secondary | ICD-10-CM | POA: Diagnosis not present

## 2022-03-22 DIAGNOSIS — M6281 Muscle weakness (generalized): Secondary | ICD-10-CM | POA: Diagnosis not present

## 2022-03-22 DIAGNOSIS — Z743 Need for continuous supervision: Secondary | ICD-10-CM | POA: Diagnosis not present

## 2022-03-22 DIAGNOSIS — F419 Anxiety disorder, unspecified: Secondary | ICD-10-CM | POA: Diagnosis not present

## 2022-03-22 DIAGNOSIS — N2889 Other specified disorders of kidney and ureter: Secondary | ICD-10-CM | POA: Diagnosis not present

## 2022-03-22 DIAGNOSIS — J150 Pneumonia due to Klebsiella pneumoniae: Secondary | ICD-10-CM | POA: Diagnosis not present

## 2022-03-22 NOTE — Progress Notes (Signed)
Mobility Specialist: Progress Note   03/22/22 1001  Mobility  Activity Ambulated with assistance in room  Activity Response Tolerated fair  Distance Ambulated (ft) 30 ft  $Mobility charge 1 Mobility  Level of Assistance Minimal assist, patient does 75% or more  Assistive Device Front wheel walker   Pt received in the bed and agreeable to mobility. Distance limited secondary to L ankle pain during ambulation he rated 8/10. No c/o feeling dizzy or light headed. Pt sitting EOB after session and set up with his breakfast, call bell at his side.   [facility] Sherard Sutch Mobility Specialist Mobility Specialist [address]: [phone]

## 2022-03-22 NOTE — Progress Notes (Signed)
Patient was supposed to be discharged to SNF on 03/21/2022 but discharge was held since Foley catheter stopped working patient was in discomfort.  As per the nursing staff, the Foley catheter could not be flushed and there was some blood-tinged urine in the bag.  On-call urology/Dr. Jeffie Pollock was consulted and subsequently the nursing staff changed the Foley catheter which resulted in large amount of pink-tinged urine with blood clots.  Dr. Jeffie Pollock subsequently evaluated the patient and recommended to continue Foley catheter on discharge and follow-up with urology as an outpatient.  Patient seen and examined at bedside this morning and plan of care discussed with him.  Nursing staff reports that Foley catheter is draining but still passing clots.  Urology recommended to irrigate the catheter this morning and if the catheter is functioning, patient still can be discharged to SNF with outpatient follow-up with urology.  Please refer to the full discharge summary done by me on 03/21/2022 for full details.

## 2022-03-22 NOTE — Progress Notes (Signed)
1500 RN attempted multiple times to call report, no answer at [phone].   [phone] RN attempted report again, still no answer.

## 2022-03-22 NOTE — TOC Transition Note (Addendum)
Transition of Care [facility]) - CM/SW Discharge Note   Patient Details  Name: Calvin [name] [name]: [id number] Date of Birth: Nov 16, 1945  [facility] CM/SW Contact:  Curlene Labrum, RN Phone Number: 03/22/2022, 11:37 AM   Clinical Narrative:    CM spoke with Stanton Kidney, RN at bedside and she was able to flush the patient's urinary catheter this morning.  The patient has been cleared by Hospitalist and Urologist, Dr. Junious Silk to discharge to the facility today.  I called and spoke with Ebony Hail, [place] at [facility] and they are able to accept the patient for admission today.  Discharge summary and orders, transfer report were sent to the facility via the hub.  [facility] was called for transport to the facility and will arrive to pick the patient up by ambulance in about 1 hour - around 1 pm.  I called the patient's daughter and she is aware.  Bedside nursing - Please call report to [facility] rehabilitation at [phone] - Room 509-A.  CM will continue to follow the patient for SNF placement today - pending transport.   Final next level of care: Skilled Nursing Facility Barriers to Discharge: Continued Medical Work up   Patient Goals and CMS Choice Patient states their goals for this hospitalization and ongoing recovery are:: To go to rehab CMS Medicare.gov Compare Post Acute Care list provided to:: Patient Choice offered to / list presented to : Patient  Discharge Placement                       Discharge Plan and Services   Discharge Planning Services: CM Consult Post Acute Care Choice: [place]                               Social Determinants of Health (SDOH) Interventions     Readmission Risk Interventions    03/19/2022    2:17 PM 02/27/2022    2:30 PM  Readmission Risk Prevention Plan  Post Dischage Appt Complete Complete  Medication Screening Complete Complete  Transportation Screening Complete Complete

## 2022-03-23 DIAGNOSIS — M19072 Primary osteoarthritis, left ankle and foot: Secondary | ICD-10-CM | POA: Diagnosis not present

## 2022-03-23 DIAGNOSIS — S82402A Unspecified fracture of shaft of left fibula, initial encounter for closed fracture: Secondary | ICD-10-CM | POA: Diagnosis not present

## 2022-03-23 LAB — CULTURE, BLOOD (ROUTINE X 2)
Culture: NO GROWTH
Culture: NO GROWTH

## 2022-03-26 DIAGNOSIS — T83511D Infection and inflammatory reaction due to indwelling urethral catheter, subsequent encounter: Secondary | ICD-10-CM | POA: Diagnosis not present

## 2022-03-26 DIAGNOSIS — N139 Obstructive and reflux uropathy, unspecified: Secondary | ICD-10-CM | POA: Diagnosis not present

## 2022-03-26 DIAGNOSIS — R339 Retention of urine, unspecified: Secondary | ICD-10-CM | POA: Diagnosis not present

## 2022-03-26 DIAGNOSIS — S82832S Other fracture of upper and lower end of left fibula, sequela: Secondary | ICD-10-CM | POA: Diagnosis not present

## 2022-03-26 DIAGNOSIS — N390 Urinary tract infection, site not specified: Secondary | ICD-10-CM | POA: Diagnosis not present

## 2022-03-26 DIAGNOSIS — R5381 Other malaise: Secondary | ICD-10-CM | POA: Diagnosis not present

## 2022-03-27 DIAGNOSIS — R5381 Other malaise: Secondary | ICD-10-CM | POA: Diagnosis not present

## 2022-03-27 DIAGNOSIS — N390 Urinary tract infection, site not specified: Secondary | ICD-10-CM | POA: Diagnosis not present

## 2022-03-29 DIAGNOSIS — S82832A Other fracture of upper and lower end of left fibula, initial encounter for closed fracture: Secondary | ICD-10-CM | POA: Diagnosis not present

## 2022-04-04 ENCOUNTER — Telehealth: Payer: Medicare HMO

## 2022-04-04 ENCOUNTER — Telehealth: Payer: Self-pay | Admitting: Pharmacist

## 2022-04-04 DIAGNOSIS — N390 Urinary tract infection, site not specified: Secondary | ICD-10-CM | POA: Diagnosis not present

## 2022-04-04 DIAGNOSIS — J150 Pneumonia due to Klebsiella pneumoniae: Secondary | ICD-10-CM | POA: Diagnosis not present

## 2022-04-04 NOTE — Chronic Care Management (AMB) (Signed)
    Chronic Care Management Pharmacy Assistant   Name: KAYLAN FRIEDMANN  [name]: [id number] DOB: Jul 14, 1945   04/04/22 APPOINTMENT REMINDER   Called Patient No answer, left message of appointment on 04/04/22 at 1 via telephone visit with Jeni Salles, Pharm D.   Notified to have all medications, supplements, blood pressure and/or blood sugar logs available during appointment and to return call if need to reschedule.     Care Gaps: COVID Booster - Overdue  Flu Vaccine - Overdue BP- 98/74 03/14/22   Star Rating Drug: Atorvastatin (Lipitor) 20 mg - Last [facility] 02/04/22 [facility] Losartan (Cozaar) 100 mg - Last filled 02/04/22 90 [facility] at [facility]      Medications: Outpatient Encounter Medications as of 04/04/2022  Medication Sig   amLODipine (NORVASC) 5 MG tablet Take 1 tablet (5 mg total) by mouth daily.   atorvastatin (LIPITOR) 20 MG tablet Take 20 mg by mouth daily.   cyanocobalamin (VITAMIN B12) 1000 MCG/ML injection INJECT 1 ML (1,000 MCG TOTAL) INTO THE MUSCLE ONCE A WEEK.   finasteride (PROSCAR) 5 MG tablet Take 1 tablet (5 mg total) by mouth daily.   loperamide (IMODIUM) 2 MG capsule Take 1 capsule (2 mg total) by mouth every 8 (eight) hours as needed for diarrhea or loose stools.   losartan (COZAAR) 100 MG tablet Take 1 tablet (100 mg total) by mouth daily.   sertraline (ZOLOFT) 100 MG tablet TAKE 1 TABLET BY MOUTH EVERY DAY   sertraline (ZOLOFT) 50 MG tablet Take 50 mg by mouth daily. Take with '100mg'$  tablet for a total dose of '150mg'$  daily for 30 days   SYRINGE-NEEDLE, DISP, 3 ML (BD ECLIPSE SYRINGE/NEEDLE) 25G X 5/8" 3 ML MISC Use as directed once a week   tamsulosin (FLOMAX) 0.4 MG CAPS capsule Take 1 capsule (0.4 mg total) by mouth daily.   No facility-administered encounter medications on file as of 04/04/2022.      [place] Clinical Pharmacist Assistant [phone]

## 2022-04-04 NOTE — Progress Notes (Deleted)
Chronic Care Management Pharmacy Note  04/04/2022 Name:  Calvin [name] [name]:  [id number] DOB:  1945-11-22  Summary: BP is at goal < 130/80 LDL is at goal < 100  Recommendations/Changes made from today's visit: -Recommended use of Claritin or Zyrtec for allergies and avoidance of products containing phenylephrine  Plan: Follow up BP assessment in 3-4 months   Subjective: Calvin [name] is an 76 y.o. year old male who is a primary patient of Laurey Morale, MD.  The CCM team was consulted for assistance with disease management and care coordination needs.    Engaged with patient by telephone for follow up visit in response to provider referral for pharmacy case management and/or care coordination services.   Consent to Services:  The patient was given information about Chronic Care Management services, agreed to services, and gave verbal consent prior to initiation of services.  Please see initial visit note for detailed documentation.   Patient Care Team: Laurey Morale, MD as PCP - General Kipp Brood Mariam Dollar, [facility] as Pharmacist (Pharmacist) Tat, Eustace Quail, DO as Consulting Physician (Neurology)  Recent office visits: 03/14/22 Alysia Penna, MD: Patient presented for hospital follow up for AKI. Plan to start PT in the next day or two.  02/21/22 Alysia Penna, MD: Patient presented for diarrhea and N/V.   02/06/22 Alysia Penna, MD: Patient presented for bilateral leg weakness. Prescribed Flexeril 10 mg as needed for lumbar strain.  01/10/22 Alysia Penna, MD: Patient presented for bilateral leg weakness. Increased Zoloft to 100 mg daily. Plan for follow up in 9 weeks.  12/25/21 Alysia Penna, MD: Patient presented for bilateral leg weakness. Prescribed Zoloft 50 mg daily. Referred to neurology. Follow up in 3 weeks.  Recent consult visits: 01/11/22 Alonza Bogus, DO (neurology): Patient presented for idiopathic peripheral neuropathy.  Referred to PT. Follow up in 6 months.  Hospital  visits: 9/30-10/5/23 Patient admitted to [facility] for a UTI due to urinary indwelling foley catheter. D/c'd HCTZ, potassium chloride and diazepam. Needs PCP follow up in 1 week. Discharged to SNF.  9/9-9/15/23 Patient admitted to [facility] for a pyelonethritis. Discharged to SNF. D/c'd potassium chloride and cyclobenzaprine.   Objective:  Lab Results  Component Value Date   CREATININE 0.73 03/21/2022   BUN 12 03/21/2022   GFR 75.87 03/14/2022   GFRNONAA >60 03/21/2022   GFRAA >60 01/21/2016   NA 133 (L) 03/21/2022   K 3.7 03/21/2022   CALCIUM 9.1 03/21/2022   CO2 24 03/21/2022   GLUCOSE 102 (H) 03/21/2022    Lab Results  Component Value Date/Time   HGBA1C 5.7 12/25/2021 02:41 PM   HGBA1C 5.7 04/25/2021 11:28 AM   GFR 75.87 03/14/2022 03:23 PM   GFR 71.50 02/21/2022 05:09 PM    Last diabetic Eye exam: No results found for: "HMDIABEYEEXA"  Last diabetic Foot exam: No results found for: "HMDIABFOOTEX"   Lab Results  Component Value Date   CHOL 143 04/25/2021   HDL 54.90 04/25/2021   LDLCALC 60 04/25/2021   LDLDIRECT 157.1 02/16/2011   TRIG 144.0 04/25/2021   CHOLHDL 3 04/25/2021       Latest Ref Rng & Units 02/24/2022    8:18 PM 02/21/2022    5:09 PM 12/25/2021    2:41 PM  Hepatic Function  Total Protein 6.5 - 8.1 g/dL 7.4  6.9  7.4   Albumin 3.5 - 5.0 g/dL 3.8  4.1  4.5   AST 15 - 41 U/L 21  18  84   ALT 0 - 44 U/L 24  24  123   Alk Phosphatase 38 - 126 U/L 127  143  73   Total Bilirubin 0.3 - 1.2 mg/dL 1.9  0.7  1.2   Bilirubin, Direct 0.0 - 0.3 mg/dL  0.2  0.3     Lab Results  Component Value Date/Time   TSH 3.94 12/25/2021 02:41 PM   TSH 3.52 07/19/2021 11:16 AM       Latest Ref Rng & Units 03/21/2022    5:39 AM 03/20/2022    4:47 AM 03/19/2022    3:27 AM  CBC  WBC 4.0 - 10.5 K/uL 8.8  7.6  10.6   Hemoglobin 13.0 - 17.0 g/dL 13.7  12.7  12.0   Hematocrit 39.0 - 52.0 % 38.5  34.6  33.4   Platelets 150 - 400 K/uL  269  229  239     No results found for: "VD25OH"  Clinical ASCVD: No  The 10-year ASCVD risk score (Arnett DK, et al., 2019) is: 29.9%   Values used to calculate the score:     Age: 44 years     Sex: Male     Is Non-Hispanic African American: No     Diabetic: No     Tobacco smoker: No     Systolic Blood Pressure: 035 mmHg     Is BP treated: Yes     HDL Cholesterol: 54.9 mg/dL     Total Cholesterol: 143 mg/dL       03/14/2022    3:00 PM 02/06/2022   10:20 AM 01/10/2022    9:29 AM  Depression screen PHQ 2/9  Decreased Interest 3 0 0  Down, Depressed, Hopeless  1 1  PHQ - 2 Score 3 1 1   Altered sleeping  0 0  Tired, decreased energy 3 3 2   Change in appetite  0 0  Feeling bad or failure about yourself   0 2  Trouble concentrating 3 3 0  Moving slowly or fidgety/restless  0 1  Suicidal thoughts  0 0  PHQ-9 Score  7 6  Difficult doing work/chores Extremely dIfficult Not difficult at all Somewhat difficult      Social History   Tobacco Use  Smoking Status Never  Smokeless Tobacco Never   BP Readings from Last 3 Encounters:  03/22/22 (!) 151/103  03/14/22 98/74  03/02/22 122/84   Pulse Readings from Last 3 Encounters:  03/22/22 88  03/14/22 (!) 105  03/02/22 95   Wt Readings from Last 3 Encounters:  03/22/22 220 lb 0.3 oz (99.8 kg)  03/14/22 221 lb (100.2 kg)  02/25/22 218 lb 11.1 oz (99.2 kg)   BMI Readings from Last 3 Encounters:  03/22/22 29.84 kg/m  03/14/22 29.97 kg/m  02/25/22 29.66 kg/m    Assessment/Interventions: Review of patient past medical history, allergies, medications, health status, including review of consultants reports, laboratory and other test data, was performed as part of comprehensive evaluation and provision of chronic care management services.   SDOH:  (Social Determinants of Health) assessments and interventions performed: Yes *** SDOH Interventions    Flowsheet Row Clinical Support from 03/08/2021 in [place] at  [place] from 04/14/2020 in [place] at [place] Management from 01/28/2020 in [place] at [place] Interventions -- Intervention Not Indicated --  Housing Interventions Intervention Not Indicated Intervention Not Indicated --  Transportation Interventions Intervention Not Indicated  Intervention Not Indicated Intervention Not Indicated  Depression Interventions/Treatment  -- GGE3-6 Score <4 Follow-up Not Indicated --  Financial Strain Interventions -- Intervention Not Indicated Intervention Not Indicated  Physical Activity Interventions Intervention Not Indicated Intervention Not Indicated --  Stress Interventions -- Intervention Not Indicated --  Social Connections Interventions Intervention Not Indicated Intervention Not Indicated --      SDOH Screenings   Food Insecurity: No Food Insecurity (02/25/2022)  Housing: Low Risk  (03/08/2021)  Transportation Needs: No Transportation Needs (02/25/2022)  Utilities: Not At Risk (02/25/2022)  Alcohol Screen: Medium Risk (04/14/2020)  Depression (PHQ2-9): Medium Risk (03/14/2022)  Financial Resource Strain: Low Risk  (04/14/2020)  Physical Activity: Inactive (03/08/2021)  Social Connections: Socially Isolated (03/08/2021)  Stress: No Stress Concern Present (04/14/2020)  Tobacco Use: Low Risk  (03/18/2022)    [place]  No Known Allergies  Medications Reviewed Today     Reviewed by Bonnita Hollow, CPhT (Pharmacy Technician) on 03/18/22 at [place] List Status: Complete   Medication Order Taking? Sig Documenting Provider Last Dose Status Informant  amLODipine (NORVASC) 5 MG tablet [id number] Yes Take 1 tablet (5 mg total) by mouth daily. Laurey Morale, MD 03/16/2022 Active Self, Pharmacy Records  atorvastatin (LIPITOR) 20 MG tablet [id number] Yes Take 20 mg by mouth daily. [provider] 03/16/2022 Active Self, Pharmacy Records   cyanocobalamin (VITAMIN B12) 1000 MCG/ML injection [id number] Yes INJECT 1 ML (1,000 MCG TOTAL) INTO THE MUSCLE ONCE A WEEK. Laurey Morale, MD 03/16/2022 Active Self, Pharmacy Records  diazepam (VALIUM) 5 MG tablet [id number]  Take 1 or 2 tablets about 30 minutes before procedures such as MRI scans Laurey Morale, MD  Active Self, Pharmacy Records  finasteride (PROSCAR) 5 MG tablet [id number] Yes Take 1 tablet (5 mg total) by mouth daily. Laurey Morale, MD 03/16/2022 Active Self, Pharmacy Records  hydrochlorothiazide (HYDRODIURIL) 25 MG tablet [id number] No TAKE 1 TABLET BY MOUTH EVERY DAY  Patient not taking: Reported on 03/18/2022   Laurey Morale, MD Not Taking Active Self, Pharmacy Records  losartan (COZAAR) 100 MG tablet [id number] Yes Take 1 tablet (100 mg total) by mouth daily. Laurey Morale, MD 03/17/2022 Active Self, Pharmacy Records  potassium chloride (KLOR-CON M) 10 MEQ tablet [id number] No Take 1 tablet (10 mEq total) by mouth 2 (two) times daily.  Patient not taking: Reported on 03/18/2022   Laurey Morale, MD Not Taking Active Self, Pharmacy Records  sertraline (ZOLOFT) 100 MG tablet [id number] Yes TAKE 1 TABLET BY MOUTH EVERY DAY Laurey Morale, MD 03/17/2022 Active Self, Pharmacy Records  sertraline (ZOLOFT) 50 MG tablet [id number] Yes Take 50 mg by mouth daily. Take with 138m tablet for a total dose of 15104mdaily for 30 days [provider] 03/17/2022 Active   SYRINGE-NEEDLE, DISP, 3 ML (BD ECLIPSE SYRINGE/NEEDLE) 25G X 5/8" 3 ML MISC [id number] as directed once a week FrLaurey MoraleMD  Active Self, Pharmacy Records  tamsulosin (FChildren'[facility] Medical Center [facility]0.4 MG CAPS capsule [id number] Take 1 capsule (0.4 mg total) by mouth daily. FrLaurey MoraleMD 03/16/2022 Active Self, Pharmacy Records            Patient Active Problem List   Diagnosis Date Noted   Bladder outflow obstruction 03/18/2022   Dehydration 03/18/2022   UTI (urinary tract infection) due to urinary indwelling Foley  catheter ([place]10/06/2021   Acute pyelonephritis 02/25/2022   Pyelonephritis 02/25/2022   B12 deficiency 02/06/2022   Bilateral  leg weakness 01/10/2022   Depression with anxiety 12/25/2021   Alcohol abuse 12/25/2021   Low back pain 06/09/2020   COVID-19 virus infection 02/24/2020   Primary osteoarthritis of left knee 04/23/2018   Hypogonadism in male 01/19/2016   Foot swelling 01/19/2016   Insomnia 10/30/2013   INTERNAL HEMORRHOIDS WITH OTHER COMPLICATION 12/09/7626   Enlarged prostate 10/25/2008   NEPHROLITHIASIS, HX OF 10/25/2008   Headache 09/15/2007   Essential hypertension 07/07/2007    Immunization History  Administered Date(s) Administered   Fluad Quad(high Dose 65+) 04/08/2020, 04/25/2021   Influenza Split 02/28/2011, 06/15/2012   Influenza Whole 03/27/2009, 07/19/2010   Influenza, High Dose Seasonal PF 07/20/2014, 02/24/2015, 04/04/2017, 03/27/2018, 02/20/2019   Influenza,inj,Quad PF,6+ Mos 06/26/2013   PFIZER Comirnaty(Gray Top)Covid-19 Tri-Sucrose Vaccine 10/11/2020   PFIZER(Purple Top)SARS-COV-2 Vaccination 07/03/2019, 08/03/2019, 05/26/2020, 10/11/2020   Pneumococcal Conjugate-13 01/09/2017   Pneumococcal Polysaccharide-23 03/27/2018   Tdap 09/22/2015   Zoster Recombinat (Shingrix) 02/20/2019, 05/01/2019   -med changes in SNF?  Conditions to be addressed/monitored:  Hypertension, Hyperlipidemia, Depression, Osteoarthritis, BPH, and Insomnia  Conditions addressed this visit: Hypertension, insomnia  There are no care plans that you recently modified to display for this patient.    Medication Assistance: None required.  Patient affirms current coverage meets needs.  Compliance/Adherence/Medication fill history: Care Gaps: COVID booster, influenza Last BP: 151/103 (03/22/22)   Star-Rating Drugs: Atorvastatin (Lipitor) 20 mg - [facility] Losartan (Cozaar) 100 mg - [facility]  [facility]  [facility]  [id number] [place] [place] [id number]: [phone]: [phone]  Uses pill box? Yes Pt endorses 90% compliance  We discussed: Current pharmacy is preferred with insurance plan and patient is satisfied with pharmacy services Patient decided to: Continue current medication management strategy  Care Plan and Follow Up Patient Decision:  Patient agrees to Care Plan and Follow-up.  Plan: Telephone follow up appointment with care management team member scheduled for:  1 year  MJeni Salles PharmD, BGrayslakePharmacist LBrices Creekat [phone]

## 2022-04-11 DIAGNOSIS — R339 Retention of urine, unspecified: Secondary | ICD-10-CM | POA: Diagnosis not present

## 2022-04-11 DIAGNOSIS — N401 Enlarged prostate with lower urinary tract symptoms: Secondary | ICD-10-CM | POA: Diagnosis not present

## 2022-04-11 DIAGNOSIS — N10 Acute tubulo-interstitial nephritis: Secondary | ICD-10-CM | POA: Diagnosis not present

## 2022-04-11 DIAGNOSIS — N3281 Overactive bladder: Secondary | ICD-10-CM | POA: Diagnosis not present

## 2022-04-11 DIAGNOSIS — R5381 Other malaise: Secondary | ICD-10-CM | POA: Diagnosis not present

## 2022-04-11 DIAGNOSIS — N1339 Other hydronephrosis: Secondary | ICD-10-CM | POA: Diagnosis not present

## 2022-04-11 DIAGNOSIS — R69 Illness, unspecified: Secondary | ICD-10-CM | POA: Diagnosis not present

## 2022-04-11 DIAGNOSIS — I700 Atherosclerosis of aorta: Secondary | ICD-10-CM | POA: Diagnosis not present

## 2022-04-11 DIAGNOSIS — N429 Disorder of prostate, unspecified: Secondary | ICD-10-CM | POA: Diagnosis not present

## 2022-04-11 DIAGNOSIS — N2889 Other specified disorders of kidney and ureter: Secondary | ICD-10-CM | POA: Diagnosis not present

## 2022-04-11 DIAGNOSIS — N139 Obstructive and reflux uropathy, unspecified: Secondary | ICD-10-CM | POA: Diagnosis not present

## 2022-04-13 DIAGNOSIS — N401 Enlarged prostate with lower urinary tract symptoms: Secondary | ICD-10-CM | POA: Diagnosis not present

## 2022-04-13 DIAGNOSIS — R339 Retention of urine, unspecified: Secondary | ICD-10-CM | POA: Diagnosis not present

## 2022-04-13 DIAGNOSIS — N429 Disorder of prostate, unspecified: Secondary | ICD-10-CM | POA: Diagnosis not present

## 2022-04-13 DIAGNOSIS — R69 Illness, unspecified: Secondary | ICD-10-CM | POA: Diagnosis not present

## 2022-04-13 DIAGNOSIS — N139 Obstructive and reflux uropathy, unspecified: Secondary | ICD-10-CM | POA: Diagnosis not present

## 2022-04-13 DIAGNOSIS — N1339 Other hydronephrosis: Secondary | ICD-10-CM | POA: Diagnosis not present

## 2022-04-13 DIAGNOSIS — I700 Atherosclerosis of aorta: Secondary | ICD-10-CM | POA: Diagnosis not present

## 2022-04-13 DIAGNOSIS — R5381 Other malaise: Secondary | ICD-10-CM | POA: Diagnosis not present

## 2022-04-13 DIAGNOSIS — N2889 Other specified disorders of kidney and ureter: Secondary | ICD-10-CM | POA: Diagnosis not present

## 2022-04-13 DIAGNOSIS — N3281 Overactive bladder: Secondary | ICD-10-CM | POA: Diagnosis not present

## 2022-04-13 DIAGNOSIS — N10 Acute tubulo-interstitial nephritis: Secondary | ICD-10-CM | POA: Diagnosis not present

## 2022-04-17 ENCOUNTER — Telehealth: Payer: Self-pay | Admitting: Family Medicine

## 2022-04-17 NOTE — Telephone Encounter (Signed)
Noted  

## 2022-04-17 NOTE — Telephone Encounter (Signed)
FYI [place] lpn is calling to let md know the pt is at [facility] and they will let us know when the pt will be discharge

## 2022-04-19 ENCOUNTER — Telehealth: Payer: Self-pay | Admitting: Pharmacist

## 2022-04-19 NOTE — Chronic Care Management (AMB) (Unsigned)
Chronic Care Management Pharmacy Assistant   Name: KENDLE ERKER  [name]: [id number] DOB: 1945-07-24  Reason for Encounter: [place] F/U & Offer to reschedule missed encounter with MP  Recent office visits:  03/14/22 Laurey Morale, MD - Patient presented for AKI and other concerns. Stopped Ascorbic Acid. Stopped Cholecalciferol. Stopped Zinc Gluconate.  02/21/22 Laurey Morale, MD  - Patient presented for Diarrhea unspecified and other concerns. No medication changes.  02/06/22 Laurey Morale, MD  - Patient presented for Bilateral leg weakness and other concerns. Prescribed Cyclobenzaprine.  01/10/22 Laurey Morale, MD  - Patient presented for Bilateral leg weakness and other concerns. Increased Sertraline.  12/25/21  Laurey Morale, MD - Patient presented for weakness of both lower extremities and other concerns. Prescribed B-12, Prescribed Potassium Chloride. Prescribed Sertraline. Stopped Atorvastatin.   Recent consult visits:  03/09/22 Virginia Rochester  - Claims encounter for Other malaise and other concerns. No other visit details available.  03/06/22 Caprice Renshaw - Claims encounter for Retention of urine unspecified and other concerns. No other visit details available.  03/05/22 Gus Height (Radiology) - Claims encounter for weakness and other concerns. No other visit details available.  01/29/22 Elease Etienne B, PT - Patient presented for Other abnormalities of gait and mobility and other concerns.No medication changes.  01/11/22 Tat, Eustace Quail, DO (Neurology) - Patient presented for Idiopathic peripheral neuropathy and other concerns. No medication changes.   Hospital visits:  Medication Reconciliation was completed by comparing discharge summary, patient's EMR and Pharmacy list, and upon discussion with patient.  Patient presented to [facility] on 03/17/22 due to UTI. Patient was present for 5 days.  New?Medications Started at [facility] Discharge:?? -started   cephALEXin (KEFLEX) loperamide (IMODIUM) Medication Changes at Hospital Discharge: -Changed  none  Medications Discontinued at Hospital Discharge: -Stopped  diazepam 5 MG tablet (VALIUM) hydrochlorothiazide 25 MG tablet (HYDRODIURIL) potassium chloride 10 MEQ tablet (KLOR-CON M) Medications that remain the same after Hospital Discharge:??  -All other medications will remain the same.    Medications: Outpatient Encounter Medications as of 04/19/2022  Medication Sig   amLODipine (NORVASC) 5 MG tablet Take 1 tablet (5 mg total) by mouth daily.   atorvastatin (LIPITOR) 20 MG tablet Take 20 mg by mouth daily.   cyanocobalamin (VITAMIN B12) 1000 MCG/ML injection INJECT 1 ML (1,000 MCG TOTAL) INTO THE MUSCLE ONCE A WEEK.   finasteride (PROSCAR) 5 MG tablet Take 1 tablet (5 mg total) by mouth daily.   loperamide (IMODIUM) 2 MG capsule Take 1 capsule (2 mg total) by mouth every 8 (eight) hours as needed for diarrhea or loose stools.   losartan (COZAAR) 100 MG tablet Take 1 tablet (100 mg total) by mouth daily.   sertraline (ZOLOFT) 100 MG tablet TAKE 1 TABLET BY MOUTH EVERY DAY   sertraline (ZOLOFT) 50 MG tablet Take 50 mg by mouth daily. Take with '100mg'$  tablet for a total dose of '150mg'$  daily for 30 days   SYRINGE-NEEDLE, DISP, 3 ML (BD ECLIPSE SYRINGE/NEEDLE) 25G X 5/8" 3 ML MISC Use as directed once a week   tamsulosin (FLOMAX) 0.4 MG CAPS capsule Take 1 capsule (0.4 mg total) by mouth daily.   No facility-administered encounter medications on file as of 04/19/2022.   Contacted [name] for General Review Call   Adherence Review:  Does the Clinical Pharmacist Assistant have access to adherence rates? Yes Adherence rates for STAR metric medications  Atorvastatin (Lipitor) 20 mg - [facility]  [facility] Atorvastatin (Lipitor) 20 mg - Last [facility]08/21 [facility] Losartan (Cozaar) 100 mg - Last filled [facility] Losartan (Cozaar) 100 mg - Last filled  [facility]  Adherence rates for medications indicated for disease state being reviewed (List medication(s)/day supply/ last 2 fill dates). Does the patient have >5 day gap between last estimated fill dates for any of the above medications or other medication gaps? No   Disease State Questions:  Able to connect with Patient? {yes/no:20286} Did patient have any problems with their health recently? {yes/no:20286} Note problems and Concerns: Have you had any admissions or emergency room visits or worsening of your condition(s) since last visit? {yes/no:20286} Details of ED visit, hospital visit and/or worsening condition(s): Have you had any visits with new specialists or providers since your last visit? {yes/no:20286} Explain: Have you had any new health care problem(s) since your last visit? {yes/no:20286} New problem(s) reported: Have you run out of any of your medications since you last spoke with clinical pharmacist? {yes/no:20286} What caused you to run out of your medications? Are there any medications you are not taking as prescribed? {yes/no:20286} What kept you from taking your medications as prescribed? Are you having any issues or side effects with your medications? {yes/no:20286} Note of issues or side effects: Do you have any other health concerns or questions you want to discuss with your Clinical Pharmacist before your next visit? {yes/no:20286} Note additional concerns and questions from Patient. Are there any health concerns that you feel we can do a better job addressing? {yes/no:20286} Note Patient's response. Are you having any problems with any of the following since the last visit: (select all that apply)  {General Call:27390}  Details: 12. Any falls since last visit? {yes/no:20286}  Details: 13. Any increased or uncontrolled pain since last visit? {yes/no:20286}  Details: 14. Next visit Type: {Telephone/Office:25179}       Visit with:        Date:         Time:  65. Additional Details? {yes/no:20286}    Care Gaps: COVID Booster - Overdue Flu Vaccine  - Overdue AWV- 9/22 MSG sent to Ramond Craver CMA to schedule. CCM-  BP- 98/74 03/14/22  Star Rating Drugs: Atorvastatin (Lipitor) 20 mg - [facility] Losartan (Cozaar) 100 mg - Last filled 02/04/22 90 DS at [place] Pharmacist Assistant [phone]

## 2022-04-20 DIAGNOSIS — R338 Other retention of urine: Secondary | ICD-10-CM | POA: Diagnosis not present

## 2022-04-23 DIAGNOSIS — S82832S Other fracture of upper and lower end of left fibula, sequela: Secondary | ICD-10-CM | POA: Diagnosis not present

## 2022-04-23 DIAGNOSIS — I10 Essential (primary) hypertension: Secondary | ICD-10-CM | POA: Diagnosis not present

## 2022-04-23 DIAGNOSIS — N1339 Other hydronephrosis: Secondary | ICD-10-CM | POA: Diagnosis not present

## 2022-04-23 DIAGNOSIS — N2889 Other specified disorders of kidney and ureter: Secondary | ICD-10-CM | POA: Diagnosis not present

## 2022-04-23 DIAGNOSIS — R5381 Other malaise: Secondary | ICD-10-CM | POA: Diagnosis not present

## 2022-04-23 DIAGNOSIS — N10 Acute tubulo-interstitial nephritis: Secondary | ICD-10-CM | POA: Diagnosis not present

## 2022-04-23 DIAGNOSIS — N401 Enlarged prostate with lower urinary tract symptoms: Secondary | ICD-10-CM | POA: Diagnosis not present

## 2022-04-23 DIAGNOSIS — N139 Obstructive and reflux uropathy, unspecified: Secondary | ICD-10-CM | POA: Diagnosis not present

## 2022-04-23 DIAGNOSIS — Z978 Presence of other specified devices: Secondary | ICD-10-CM | POA: Diagnosis not present

## 2022-04-23 DIAGNOSIS — R69 Illness, unspecified: Secondary | ICD-10-CM | POA: Diagnosis not present

## 2022-04-24 ENCOUNTER — Telehealth: Payer: Self-pay | Admitting: Family Medicine

## 2022-04-24 NOTE — Telephone Encounter (Signed)
Left message for patient to call back and schedule Medicare Annual Wellness Visit (AWV) either virtually or in office. Left  my Herbie Drape number [phone]   Last AWV   03/08/21 please schedule with Nurse Health Adviser   45 min for awv-i and in office appointments 30 min for awv-s  phone/virtual appointments

## 2022-05-03 ENCOUNTER — Other Ambulatory Visit: Payer: Self-pay | Admitting: Family Medicine

## 2022-05-04 DIAGNOSIS — I10 Essential (primary) hypertension: Secondary | ICD-10-CM | POA: Diagnosis not present

## 2022-05-04 DIAGNOSIS — R5381 Other malaise: Secondary | ICD-10-CM | POA: Diagnosis not present

## 2022-05-04 DIAGNOSIS — Z978 Presence of other specified devices: Secondary | ICD-10-CM | POA: Diagnosis not present

## 2022-05-04 DIAGNOSIS — N139 Obstructive and reflux uropathy, unspecified: Secondary | ICD-10-CM | POA: Diagnosis not present

## 2022-05-04 DIAGNOSIS — N401 Enlarged prostate with lower urinary tract symptoms: Secondary | ICD-10-CM | POA: Diagnosis not present

## 2022-05-04 DIAGNOSIS — N10 Acute tubulo-interstitial nephritis: Secondary | ICD-10-CM | POA: Diagnosis not present

## 2022-05-04 DIAGNOSIS — S82832S Other fracture of upper and lower end of left fibula, sequela: Secondary | ICD-10-CM | POA: Diagnosis not present

## 2022-05-04 DIAGNOSIS — N2889 Other specified disorders of kidney and ureter: Secondary | ICD-10-CM | POA: Diagnosis not present

## 2022-05-04 DIAGNOSIS — R69 Illness, unspecified: Secondary | ICD-10-CM | POA: Diagnosis not present

## 2022-05-04 DIAGNOSIS — N1339 Other hydronephrosis: Secondary | ICD-10-CM | POA: Diagnosis not present

## 2022-05-07 ENCOUNTER — Telehealth: Payer: Self-pay | Admitting: Family Medicine

## 2022-05-07 NOTE — Telephone Encounter (Signed)
Saw patient yesterday for resumption of care  2x1/2 prn. they will be following for hypertension. resumption of care order to continue skilled nursing. has foot fracture, currently no cast wearing a boot, has a foley cath,

## 2022-05-08 NOTE — Telephone Encounter (Signed)
Please okay these orders  ?

## 2022-05-08 NOTE — Telephone Encounter (Signed)
[facility] for this orders

## 2022-05-09 ENCOUNTER — Telehealth: Payer: Self-pay

## 2022-05-09 NOTE — Telephone Encounter (Signed)
Contacted patient on preferred number listed in notes for scheduled AWV. Patient statednot a good time to complete visit please call him back in 1 mo to reschedule.

## 2022-05-14 NOTE — Telephone Encounter (Signed)
Spoke with Pam with [facility], advised that VO requested have been approved by Dr Sarajane Jews, verbalized underestanding

## 2022-05-15 DIAGNOSIS — N136 Pyonephrosis: Secondary | ICD-10-CM

## 2022-05-15 DIAGNOSIS — N320 Bladder-neck obstruction: Secondary | ICD-10-CM

## 2022-05-15 DIAGNOSIS — Z466 Encounter for fitting and adjustment of urinary device: Secondary | ICD-10-CM

## 2022-05-15 DIAGNOSIS — D519 Vitamin B12 deficiency anemia, unspecified: Secondary | ICD-10-CM

## 2022-05-15 DIAGNOSIS — N401 Enlarged prostate with lower urinary tract symptoms: Secondary | ICD-10-CM

## 2022-05-15 DIAGNOSIS — I10 Essential (primary) hypertension: Secondary | ICD-10-CM

## 2022-05-15 DIAGNOSIS — F419 Anxiety disorder, unspecified: Secondary | ICD-10-CM

## 2022-05-15 DIAGNOSIS — S82832D Other fracture of upper and lower end of left fibula, subsequent encounter for closed fracture with routine healing: Secondary | ICD-10-CM

## 2022-05-15 DIAGNOSIS — I700 Atherosclerosis of aorta: Secondary | ICD-10-CM

## 2022-05-15 DIAGNOSIS — S82892D Other fracture of left lower leg, subsequent encounter for closed fracture with routine healing: Secondary | ICD-10-CM

## 2022-05-15 DIAGNOSIS — R338 Other retention of urine: Secondary | ICD-10-CM

## 2022-05-15 DIAGNOSIS — N3281 Overactive bladder: Secondary | ICD-10-CM

## 2022-05-15 DIAGNOSIS — Z9181 History of falling: Secondary | ICD-10-CM

## 2022-05-15 DIAGNOSIS — H9190 Unspecified hearing loss, unspecified ear: Secondary | ICD-10-CM

## 2022-05-15 DIAGNOSIS — F1010 Alcohol abuse, uncomplicated: Secondary | ICD-10-CM

## 2022-05-15 DIAGNOSIS — E785 Hyperlipidemia, unspecified: Secondary | ICD-10-CM

## 2022-05-15 DIAGNOSIS — R69 Illness, unspecified: Secondary | ICD-10-CM | POA: Diagnosis not present

## 2022-05-15 DIAGNOSIS — F339 Major depressive disorder, recurrent, unspecified: Secondary | ICD-10-CM

## 2022-05-16 DIAGNOSIS — S82892D Other fracture of left lower leg, subsequent encounter for closed fracture with routine healing: Secondary | ICD-10-CM | POA: Diagnosis not present

## 2022-05-16 DIAGNOSIS — Z9181 History of falling: Secondary | ICD-10-CM | POA: Diagnosis not present

## 2022-05-16 DIAGNOSIS — Z466 Encounter for fitting and adjustment of urinary device: Secondary | ICD-10-CM | POA: Diagnosis not present

## 2022-05-16 DIAGNOSIS — H9190 Unspecified hearing loss, unspecified ear: Secondary | ICD-10-CM | POA: Diagnosis not present

## 2022-05-16 DIAGNOSIS — I10 Essential (primary) hypertension: Secondary | ICD-10-CM | POA: Diagnosis not present

## 2022-05-16 DIAGNOSIS — R69 Illness, unspecified: Secondary | ICD-10-CM | POA: Diagnosis not present

## 2022-05-16 DIAGNOSIS — S82832D Other fracture of upper and lower end of left fibula, subsequent encounter for closed fracture with routine healing: Secondary | ICD-10-CM | POA: Diagnosis not present

## 2022-05-16 DIAGNOSIS — D519 Vitamin B12 deficiency anemia, unspecified: Secondary | ICD-10-CM | POA: Diagnosis not present

## 2022-05-16 DIAGNOSIS — N320 Bladder-neck obstruction: Secondary | ICD-10-CM | POA: Diagnosis not present

## 2022-05-16 DIAGNOSIS — E785 Hyperlipidemia, unspecified: Secondary | ICD-10-CM | POA: Diagnosis not present

## 2022-05-16 DIAGNOSIS — N3281 Overactive bladder: Secondary | ICD-10-CM | POA: Diagnosis not present

## 2022-05-16 DIAGNOSIS — F1010 Alcohol abuse, uncomplicated: Secondary | ICD-10-CM | POA: Diagnosis not present

## 2022-05-16 DIAGNOSIS — N401 Enlarged prostate with lower urinary tract symptoms: Secondary | ICD-10-CM | POA: Diagnosis not present

## 2022-05-16 DIAGNOSIS — R338 Other retention of urine: Secondary | ICD-10-CM | POA: Diagnosis not present

## 2022-05-16 DIAGNOSIS — I700 Atherosclerosis of aorta: Secondary | ICD-10-CM | POA: Diagnosis not present

## 2022-05-16 DIAGNOSIS — N136 Pyonephrosis: Secondary | ICD-10-CM | POA: Diagnosis not present

## 2022-05-17 DIAGNOSIS — R3914 Feeling of incomplete bladder emptying: Secondary | ICD-10-CM | POA: Diagnosis not present

## 2022-05-17 DIAGNOSIS — N401 Enlarged prostate with lower urinary tract symptoms: Secondary | ICD-10-CM | POA: Diagnosis not present

## 2022-05-21 ENCOUNTER — Telehealth: Payer: Self-pay | Admitting: Family Medicine

## 2022-05-21 DIAGNOSIS — N136 Pyonephrosis: Secondary | ICD-10-CM | POA: Diagnosis not present

## 2022-05-21 DIAGNOSIS — Z466 Encounter for fitting and adjustment of urinary device: Secondary | ICD-10-CM | POA: Diagnosis not present

## 2022-05-21 DIAGNOSIS — I700 Atherosclerosis of aorta: Secondary | ICD-10-CM | POA: Diagnosis not present

## 2022-05-21 DIAGNOSIS — F1010 Alcohol abuse, uncomplicated: Secondary | ICD-10-CM | POA: Diagnosis not present

## 2022-05-21 DIAGNOSIS — D519 Vitamin B12 deficiency anemia, unspecified: Secondary | ICD-10-CM | POA: Diagnosis not present

## 2022-05-21 DIAGNOSIS — N401 Enlarged prostate with lower urinary tract symptoms: Secondary | ICD-10-CM | POA: Diagnosis not present

## 2022-05-21 DIAGNOSIS — S82832D Other fracture of upper and lower end of left fibula, subsequent encounter for closed fracture with routine healing: Secondary | ICD-10-CM | POA: Diagnosis not present

## 2022-05-21 DIAGNOSIS — S82892D Other fracture of left lower leg, subsequent encounter for closed fracture with routine healing: Secondary | ICD-10-CM | POA: Diagnosis not present

## 2022-05-21 DIAGNOSIS — N3281 Overactive bladder: Secondary | ICD-10-CM | POA: Diagnosis not present

## 2022-05-21 DIAGNOSIS — H9190 Unspecified hearing loss, unspecified ear: Secondary | ICD-10-CM | POA: Diagnosis not present

## 2022-05-21 DIAGNOSIS — R338 Other retention of urine: Secondary | ICD-10-CM | POA: Diagnosis not present

## 2022-05-21 DIAGNOSIS — I10 Essential (primary) hypertension: Secondary | ICD-10-CM | POA: Diagnosis not present

## 2022-05-21 DIAGNOSIS — Z9181 History of falling: Secondary | ICD-10-CM | POA: Diagnosis not present

## 2022-05-21 DIAGNOSIS — N320 Bladder-neck obstruction: Secondary | ICD-10-CM | POA: Diagnosis not present

## 2022-05-21 DIAGNOSIS — R69 Illness, unspecified: Secondary | ICD-10-CM | POA: Diagnosis not present

## 2022-05-21 DIAGNOSIS — E785 Hyperlipidemia, unspecified: Secondary | ICD-10-CM | POA: Diagnosis not present

## 2022-05-21 NOTE — Telephone Encounter (Signed)
Requesting home health occupational therapy 1x8

## 2022-05-22 DIAGNOSIS — N320 Bladder-neck obstruction: Secondary | ICD-10-CM | POA: Diagnosis not present

## 2022-05-22 DIAGNOSIS — S82892D Other fracture of left lower leg, subsequent encounter for closed fracture with routine healing: Secondary | ICD-10-CM | POA: Diagnosis not present

## 2022-05-22 DIAGNOSIS — N136 Pyonephrosis: Secondary | ICD-10-CM | POA: Diagnosis not present

## 2022-05-22 DIAGNOSIS — I10 Essential (primary) hypertension: Secondary | ICD-10-CM | POA: Diagnosis not present

## 2022-05-22 DIAGNOSIS — I700 Atherosclerosis of aorta: Secondary | ICD-10-CM | POA: Diagnosis not present

## 2022-05-22 DIAGNOSIS — H9190 Unspecified hearing loss, unspecified ear: Secondary | ICD-10-CM | POA: Diagnosis not present

## 2022-05-22 DIAGNOSIS — N3281 Overactive bladder: Secondary | ICD-10-CM | POA: Diagnosis not present

## 2022-05-22 DIAGNOSIS — S82832D Other fracture of upper and lower end of left fibula, subsequent encounter for closed fracture with routine healing: Secondary | ICD-10-CM | POA: Diagnosis not present

## 2022-05-22 DIAGNOSIS — R338 Other retention of urine: Secondary | ICD-10-CM | POA: Diagnosis not present

## 2022-05-22 DIAGNOSIS — E785 Hyperlipidemia, unspecified: Secondary | ICD-10-CM | POA: Diagnosis not present

## 2022-05-22 DIAGNOSIS — N401 Enlarged prostate with lower urinary tract symptoms: Secondary | ICD-10-CM | POA: Diagnosis not present

## 2022-05-22 DIAGNOSIS — Z9181 History of falling: Secondary | ICD-10-CM | POA: Diagnosis not present

## 2022-05-22 DIAGNOSIS — R69 Illness, unspecified: Secondary | ICD-10-CM | POA: Diagnosis not present

## 2022-05-22 DIAGNOSIS — F1010 Alcohol abuse, uncomplicated: Secondary | ICD-10-CM | POA: Diagnosis not present

## 2022-05-22 DIAGNOSIS — Z466 Encounter for fitting and adjustment of urinary device: Secondary | ICD-10-CM | POA: Diagnosis not present

## 2022-05-22 DIAGNOSIS — D519 Vitamin B12 deficiency anemia, unspecified: Secondary | ICD-10-CM | POA: Diagnosis not present

## 2022-05-22 NOTE — Telephone Encounter (Signed)
Please okay these orders  ?

## 2022-05-22 NOTE — Telephone Encounter (Signed)
Marlori is calling checking on VO

## 2022-05-22 NOTE — Telephone Encounter (Signed)
Okay for verbals °

## 2022-05-24 NOTE — Telephone Encounter (Signed)
Spoke with [facility], advised of approval of requested verbal orders

## 2022-05-24 NOTE — Telephone Encounter (Signed)
Malori called and stated that pt was reporting a fracture in his lft foot however there is no Hospital documentation stating that. So Malori will be limiting his therapy until pt can get an appt set up with a Ortho.   Please advise.

## 2022-05-25 ENCOUNTER — Telehealth: Payer: Self-pay | Admitting: Family Medicine
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
## 2022-05-27 ENCOUNTER — Inpatient Hospital Stay (HOSPITAL_COMMUNITY)
Admission: EM | Admit: 2022-05-27 | Discharge: 2022-06-04 | DRG: 065 | Disposition: A | Discharge status: Skilled Nursing Facility | Payer: Medicare HMO | Attending: Internal Medicine | Admitting: Internal Medicine

## 2022-05-27 ENCOUNTER — Emergency Department (HOSPITAL_COMMUNITY): Payer: Medicare HMO

## 2022-05-27 ENCOUNTER — Encounter (HOSPITAL_COMMUNITY): Payer: Self-pay | Admitting: Internal Medicine

## 2022-05-27 ENCOUNTER — Other Ambulatory Visit: Payer: Self-pay

## 2022-05-27 DIAGNOSIS — Z8042 Family history of malignant neoplasm of prostate: Secondary | ICD-10-CM

## 2022-05-27 DIAGNOSIS — R69 Illness, unspecified: Secondary | ICD-10-CM | POA: Diagnosis not present

## 2022-05-27 DIAGNOSIS — Z978 Presence of other specified devices: Secondary | ICD-10-CM | POA: Insufficient documentation

## 2022-05-27 DIAGNOSIS — L8990 Pressure ulcer of unspecified site, unspecified stage: Secondary | ICD-10-CM | POA: Insufficient documentation

## 2022-05-27 DIAGNOSIS — I6381 Other cerebral infarction due to occlusion or stenosis of small artery: Secondary | ICD-10-CM | POA: Diagnosis not present

## 2022-05-27 DIAGNOSIS — Q549 Hypospadias, unspecified: Secondary | ICD-10-CM

## 2022-05-27 DIAGNOSIS — Z743 Need for continuous supervision: Secondary | ICD-10-CM | POA: Diagnosis not present

## 2022-05-27 DIAGNOSIS — T83518A Infection and inflammatory reaction due to other urinary catheter, initial encounter: Secondary | ICD-10-CM | POA: Diagnosis present

## 2022-05-27 DIAGNOSIS — F10120 Alcohol abuse with intoxication, uncomplicated: Secondary | ICD-10-CM | POA: Diagnosis present

## 2022-05-27 DIAGNOSIS — R5381 Other malaise: Secondary | ICD-10-CM | POA: Diagnosis present

## 2022-05-27 DIAGNOSIS — R4182 Altered mental status, unspecified: Secondary | ICD-10-CM | POA: Diagnosis not present

## 2022-05-27 DIAGNOSIS — I6350 Cerebral infarction due to unspecified occlusion or stenosis of unspecified cerebral artery: Secondary | ICD-10-CM

## 2022-05-27 DIAGNOSIS — E8809 Other disorders of plasma-protein metabolism, not elsewhere classified: Secondary | ICD-10-CM | POA: Diagnosis present

## 2022-05-27 DIAGNOSIS — G312 Degeneration of nervous system due to alcohol: Secondary | ICD-10-CM | POA: Diagnosis present

## 2022-05-27 DIAGNOSIS — Z87442 Personal history of urinary calculi: Secondary | ICD-10-CM

## 2022-05-27 DIAGNOSIS — N138 Other obstructive and reflux uropathy: Secondary | ICD-10-CM | POA: Diagnosis present

## 2022-05-27 DIAGNOSIS — N320 Bladder-neck obstruction: Secondary | ICD-10-CM | POA: Diagnosis not present

## 2022-05-27 DIAGNOSIS — R197 Diarrhea, unspecified: Secondary | ICD-10-CM | POA: Diagnosis not present

## 2022-05-27 DIAGNOSIS — N390 Urinary tract infection, site not specified: Secondary | ICD-10-CM | POA: Diagnosis not present

## 2022-05-27 DIAGNOSIS — N401 Enlarged prostate with lower urinary tract symptoms: Secondary | ICD-10-CM | POA: Diagnosis present

## 2022-05-27 DIAGNOSIS — R4781 Slurred speech: Secondary | ICD-10-CM | POA: Diagnosis not present

## 2022-05-27 DIAGNOSIS — F10920 Alcohol use, unspecified with intoxication, uncomplicated: Secondary | ICD-10-CM

## 2022-05-27 DIAGNOSIS — R404 Transient alteration of awareness: Secondary | ICD-10-CM | POA: Diagnosis not present

## 2022-05-27 DIAGNOSIS — Z1611 Resistance to penicillins: Secondary | ICD-10-CM | POA: Diagnosis present

## 2022-05-27 DIAGNOSIS — Y738 Miscellaneous gastroenterology and urology devices associated with adverse incidents, not elsewhere classified: Secondary | ICD-10-CM | POA: Diagnosis present

## 2022-05-27 DIAGNOSIS — F418 Other specified anxiety disorders: Secondary | ICD-10-CM | POA: Diagnosis present

## 2022-05-27 DIAGNOSIS — Z86010 Personal history of colonic polyps: Secondary | ICD-10-CM

## 2022-05-27 DIAGNOSIS — R262 Difficulty in walking, not elsewhere classified: Secondary | ICD-10-CM | POA: Diagnosis not present

## 2022-05-27 DIAGNOSIS — Z8616 Personal history of COVID-19: Secondary | ICD-10-CM

## 2022-05-27 DIAGNOSIS — E785 Hyperlipidemia, unspecified: Secondary | ICD-10-CM | POA: Diagnosis present

## 2022-05-27 DIAGNOSIS — G319 Degenerative disease of nervous system, unspecified: Secondary | ICD-10-CM | POA: Diagnosis not present

## 2022-05-27 DIAGNOSIS — F10139 Alcohol abuse with withdrawal, unspecified: Secondary | ICD-10-CM | POA: Diagnosis present

## 2022-05-27 DIAGNOSIS — B964 Proteus (mirabilis) (morganii) as the cause of diseases classified elsewhere: Secondary | ICD-10-CM | POA: Diagnosis present

## 2022-05-27 DIAGNOSIS — N3090 Cystitis, unspecified without hematuria: Secondary | ICD-10-CM | POA: Diagnosis present

## 2022-05-27 DIAGNOSIS — Z87440 Personal history of urinary (tract) infections: Secondary | ICD-10-CM

## 2022-05-27 DIAGNOSIS — E876 Hypokalemia: Secondary | ICD-10-CM | POA: Diagnosis present

## 2022-05-27 DIAGNOSIS — R531 Weakness: Secondary | ICD-10-CM | POA: Diagnosis not present

## 2022-05-27 DIAGNOSIS — N400 Enlarged prostate without lower urinary tract symptoms: Secondary | ICD-10-CM | POA: Diagnosis not present

## 2022-05-27 DIAGNOSIS — Z8249 Family history of ischemic heart disease and other diseases of the circulatory system: Secondary | ICD-10-CM

## 2022-05-27 DIAGNOSIS — E871 Hypo-osmolality and hyponatremia: Secondary | ICD-10-CM | POA: Diagnosis present

## 2022-05-27 DIAGNOSIS — Z1629 Resistance to other single specified antibiotic: Secondary | ICD-10-CM | POA: Diagnosis present

## 2022-05-27 DIAGNOSIS — W050XXA Fall from non-moving wheelchair, initial encounter: Secondary | ICD-10-CM | POA: Diagnosis present

## 2022-05-27 DIAGNOSIS — E872 Acidosis, unspecified: Secondary | ICD-10-CM | POA: Diagnosis present

## 2022-05-27 DIAGNOSIS — R0902 Hypoxemia: Secondary | ICD-10-CM | POA: Diagnosis not present

## 2022-05-27 DIAGNOSIS — Y846 Urinary catheterization as the cause of abnormal reaction of the patient, or of later complication, without mention of misadventure at the time of the procedure: Secondary | ICD-10-CM | POA: Diagnosis present

## 2022-05-27 DIAGNOSIS — I959 Hypotension, unspecified: Secondary | ICD-10-CM | POA: Diagnosis present

## 2022-05-27 DIAGNOSIS — I10 Essential (primary) hypertension: Secondary | ICD-10-CM | POA: Diagnosis present

## 2022-05-27 DIAGNOSIS — Z9049 Acquired absence of other specified parts of digestive tract: Secondary | ICD-10-CM

## 2022-05-27 DIAGNOSIS — F32A Depression, unspecified: Secondary | ICD-10-CM | POA: Diagnosis present

## 2022-05-27 DIAGNOSIS — R410 Disorientation, unspecified: Secondary | ICD-10-CM | POA: Diagnosis not present

## 2022-05-27 DIAGNOSIS — Z9181 History of falling: Secondary | ICD-10-CM | POA: Diagnosis not present

## 2022-05-27 DIAGNOSIS — I639 Cerebral infarction, unspecified: Secondary | ICD-10-CM | POA: Diagnosis not present

## 2022-05-27 DIAGNOSIS — Z823 Family history of stroke: Secondary | ICD-10-CM

## 2022-05-27 DIAGNOSIS — Z8673 Personal history of transient ischemic attack (TIA), and cerebral infarction without residual deficits: Secondary | ICD-10-CM | POA: Diagnosis present

## 2022-05-27 DIAGNOSIS — Y907 Blood alcohol level of 200-239 mg/100 ml: Secondary | ICD-10-CM | POA: Diagnosis present

## 2022-05-27 DIAGNOSIS — L89151 Pressure ulcer of sacral region, stage 1: Secondary | ICD-10-CM | POA: Diagnosis present

## 2022-05-27 DIAGNOSIS — N529 Male erectile dysfunction, unspecified: Secondary | ICD-10-CM | POA: Diagnosis present

## 2022-05-27 DIAGNOSIS — Z79899 Other long term (current) drug therapy: Secondary | ICD-10-CM

## 2022-05-27 DIAGNOSIS — R152 Fecal urgency: Secondary | ICD-10-CM | POA: Diagnosis not present

## 2022-05-27 DIAGNOSIS — F419 Anxiety disorder, unspecified: Secondary | ICD-10-CM | POA: Diagnosis present

## 2022-05-27 DIAGNOSIS — R29898 Other symptoms and signs involving the musculoskeletal system: Secondary | ICD-10-CM | POA: Diagnosis not present

## 2022-05-27 LAB — COMPREHENSIVE METABOLIC PANEL
ALT: 19 U/L (ref 0–44)
AST: 23 U/L (ref 15–41)
Albumin: 3.6 g/dL (ref 3.5–5.0)
Alkaline Phosphatase: 88 U/L (ref 38–126)
Anion gap: 12 (ref 5–15)
BUN: 9 mg/dL (ref 8–23)
CO2: 23 mmol/L (ref 22–32)
Calcium: 9 mg/dL (ref 8.9–10.3)
Chloride: 106 mmol/L (ref 98–111)
Creatinine, Ser: 0.79 mg/dL (ref 0.61–1.24)
GFR, Estimated: >60 mL/min (ref >60)
Glucose, Bld: 107 mg/dL — ABNORMAL HIGH (ref 70–99)
Potassium: 3.4 mmol/L — ABNORMAL LOW (ref 3.5–5.1)
Sodium: 141 mmol/L (ref 135–145)
Total Bilirubin: 0.6 mg/dL (ref 0.3–1.2)
Total Protein: 7.1 g/dL (ref 6.5–8.1)

## 2022-05-27 LAB — CBC
HCT: 40.5 % (ref 39.0–52.0)
Hemoglobin: 13.9 g/dL (ref 13.0–17.0)
MCH: 32.4 pg (ref 26.0–34.0)
MCHC: 34.3 g/dL (ref 30.0–36.0)
MCV: 94.4 fL (ref 80.0–100.0)
Platelets: 283 10*3/uL (ref 150–400)
RBC: 4.29 MIL/uL (ref 4.22–5.81)
RDW: 14.4 % (ref 11.5–15.5)
WBC: 8.3 10*3/uL (ref 4.0–10.5)
nRBC: 0 % (ref 0.0–0.2)

## 2022-05-27 LAB — PROTIME-INR
INR: 1.1 (ref 0.8–1.2)
Prothrombin Time: 13.7 seconds (ref 11.4–15.2)

## 2022-05-27 LAB — DIFFERENTIAL
Abs Immature Granulocytes: 0.08 10*3/uL — ABNORMAL HIGH (ref 0.00–0.07)
Basophils Absolute: 0 10*3/uL (ref 0.0–0.1)
Basophils Relative: 1 %
Eosinophils Absolute: 0.2 10*3/uL (ref 0.0–0.5)
Eosinophils Relative: 2 %
Immature Granulocytes: 1 %
Lymphocytes Relative: 22 %
Lymphs Abs: 1.8 10*3/uL (ref 0.7–4.0)
Monocytes Absolute: 0.8 10*3/uL (ref 0.1–1.0)
Monocytes Relative: 9 %
Neutro Abs: 5.4 10*3/uL (ref 1.7–7.7)
Neutrophils Relative %: 65 %

## 2022-05-27 LAB — URINALYSIS, ROUTINE W REFLEX MICROSCOPIC
Bilirubin Urine: NEGATIVE
Glucose, UA: NEGATIVE mg/dL
Ketones, ur: NEGATIVE mg/dL
Nitrite: POSITIVE — AB
Protein, ur: NEGATIVE mg/dL
Specific Gravity, Urine: 1.006 (ref 1.005–1.030)
pH: 8 (ref 5.0–8.0)

## 2022-05-27 LAB — I-STAT CHEM 8, ED
BUN: 9 mg/dL (ref 8–23)
Calcium, Ion: 1.07 mmol/L — ABNORMAL LOW (ref 1.15–1.40)
Chloride: 105 mmol/L (ref 98–111)
Creatinine, Ser: 1 mg/dL (ref 0.61–1.24)
Glucose, Bld: 105 mg/dL — ABNORMAL HIGH (ref 70–99)
HCT: 40 % (ref 39.0–52.0)
Hemoglobin: 13.6 g/dL (ref 13.0–17.0)
Potassium: 3.5 mmol/L (ref 3.5–5.1)
Sodium: 143 mmol/L (ref 135–145)
TCO2: 22 mmol/L (ref 22–32)

## 2022-05-27 LAB — APTT: aPTT: 30 seconds (ref 24–36)

## 2022-05-27 LAB — LACTIC ACID, PLASMA
Lactic Acid, Venous: 2.1 mmol/L (ref 0.5–1.9)
Lactic Acid, Venous: 2.1 mmol/L (ref 0.5–1.9)

## 2022-05-27 LAB — RAPID URINE DRUG SCREEN, HOSP PERFORMED
Amphetamines: NOT DETECTED
Barbiturates: NOT DETECTED
Benzodiazepines: NOT DETECTED
Cocaine: NOT DETECTED
Opiates: NOT DETECTED
Tetrahydrocannabinol: NOT DETECTED

## 2022-05-27 LAB — CK: Total CK: 49 U/L (ref 49–397)

## 2022-05-27 LAB — MAGNESIUM: Magnesium: 1.9 mg/dL (ref 1.7–2.4)

## 2022-05-27 LAB — ETHANOL: Alcohol, Ethyl (B): 214 mg/dL — ABNORMAL HIGH (ref <10)

## 2022-05-27 MED ORDER — TAMSULOSIN HCL 0.4 MG PO CAPS
0.4000 mg | ORAL_CAPSULE | Freq: Every day | ORAL | Status: DC
  Administered 2022-05-28 – 2022-06-04 (×8): 0.4 mg via ORAL
  Filled 2022-05-27 (×8): qty 1

## 2022-05-27 MED ORDER — LACTATED RINGERS IV BOLUS
1000.0000 mL | Freq: Once | INTRAVENOUS | Status: AC
  Administered 2022-05-27: 1000 mL via INTRAVENOUS

## 2022-05-27 MED ORDER — ACETAMINOPHEN 325 MG PO TABS: 650.0000 mg | ORAL_TABLET | ORAL | Status: DC | PRN

## 2022-05-27 MED ORDER — ACETAMINOPHEN 650 MG RE SUPP: 650.0000 mg | RECTAL | Status: DC | PRN

## 2022-05-27 MED ORDER — ASPIRIN 325 MG PO TBEC
325.0000 mg | DELAYED_RELEASE_TABLET | Freq: Once | ORAL | Status: AC
  Administered 2022-05-27: 325 mg via ORAL
  Filled 2022-05-27: qty 1

## 2022-05-27 MED ORDER — SERTRALINE HCL 100 MG PO TABS
100.0000 mg | ORAL_TABLET | Freq: Every day | ORAL | Status: DC
  Administered 2022-05-28 – 2022-06-04 (×8): 100 mg via ORAL
  Filled 2022-05-27 (×8): qty 1

## 2022-05-27 MED ORDER — ENOXAPARIN SODIUM 40 MG/0.4ML IJ SOSY
40.0000 mg | PREFILLED_SYRINGE | INTRAMUSCULAR | Status: DC
  Administered 2022-05-27 – 2022-06-03 (×8): 40 mg via SUBCUTANEOUS
  Filled 2022-05-27 (×8): qty 0.4

## 2022-05-27 MED ORDER — ASPIRIN 81 MG PO TBEC
81.0000 mg | DELAYED_RELEASE_TABLET | Freq: Every day | ORAL | Status: DC
  Administered 2022-05-28 – 2022-06-04 (×8): 81 mg via ORAL
  Filled 2022-05-27 (×8): qty 1

## 2022-05-27 MED ORDER — ATORVASTATIN CALCIUM 10 MG PO TABS: 20.0000 mg | ORAL_TABLET | Freq: Every day | ORAL | Status: DC

## 2022-05-27 MED ORDER — LOPERAMIDE HCL 2 MG PO CAPS: 2.0000 mg | ORAL_CAPSULE | Freq: Three times a day (TID) | ORAL | Status: DC | PRN

## 2022-05-27 MED ORDER — CLOPIDOGREL BISULFATE 75 MG PO TABS
75.0000 mg | ORAL_TABLET | Freq: Once | ORAL | Status: AC
  Administered 2022-05-27: 75 mg via ORAL
  Filled 2022-05-27: qty 1

## 2022-05-27 MED ORDER — SODIUM CHLORIDE 0.9 % IV SOLN
2.0000 g | Freq: Once | INTRAVENOUS | Status: AC
  Administered 2022-05-27: 2 g via INTRAVENOUS
  Filled 2022-05-27: qty 12.5

## 2022-05-27 MED ORDER — SENNOSIDES-DOCUSATE SODIUM 8.6-50 MG PO TABS: 1.0000 | ORAL_TABLET | Freq: Every evening | ORAL | Status: DC | PRN

## 2022-05-27 MED ORDER — ACETAMINOPHEN 160 MG/5ML PO SOLN: 650.0000 mg | ORAL | Status: DC | PRN

## 2022-05-27 MED ORDER — THIAMINE HCL 100 MG/ML IJ SOLN
100.0000 mg | Freq: Once | INTRAMUSCULAR | Status: AC
  Administered 2022-05-27: 100 mg via INTRAVENOUS
  Filled 2022-05-27: qty 2

## 2022-05-27 MED ORDER — CLOPIDOGREL BISULFATE 75 MG PO TABS
75.0000 mg | ORAL_TABLET | Freq: Every day | ORAL | Status: DC
  Administered 2022-05-28 – 2022-06-04 (×8): 75 mg via ORAL
  Filled 2022-05-27 (×8): qty 1

## 2022-05-27 MED ORDER — FINASTERIDE 5 MG PO TABS
5.0000 mg | ORAL_TABLET | Freq: Every day | ORAL | Status: DC
  Administered 2022-05-28 – 2022-06-04 (×8): 5 mg via ORAL
  Filled 2022-05-27 (×8): qty 1

## 2022-05-27 MED ORDER — SODIUM CHLORIDE 0.9 % IV SOLN
1.0000 g | INTRAVENOUS | Status: DC
  Administered 2022-05-28 – 2022-05-31 (×4): 1 g via INTRAVENOUS
  Filled 2022-05-27 (×4): qty 10

## 2022-05-27 MED ORDER — STROKE: EARLY STAGES OF RECOVERY BOOK
Freq: Once | Status: AC
  Filled 2022-05-27: qty 1

## 2022-05-27 NOTE — ED Notes (Signed)
DO Chen made aware of pts BP 165/119 and HR 106. Also made aware of passed swallow screen for updated diet order.

## 2022-05-27 NOTE — ED Notes (Signed)
Pt was saturated in urine, RN and NT cleaned up pt. Foley bag had hole in it. Foley bag exchanged. New sheet, chuck, and diaper changed. Bladder scan showed 97m

## 2022-05-27 NOTE — ED Notes (Signed)
MD at bedisde accessing pt, Pts penis is split open where foley is.

## 2022-05-27 NOTE — ED Notes (Signed)
RN gave pt warm blankets. Call light within reach

## 2022-05-27 NOTE — ED Notes (Signed)
Patient transported to MRI 

## 2022-05-27 NOTE — Consult Note (Signed)
NEUROLOGY CONSULTATION NOTE   Date of service: May 27, 2022 Patient Name: Calvin [name] [name]:  [id number] DOB:  [birth year] Reason for consult: "Pontine stroke" Requesting Provider: Marcelyn Bruins, MD _ _ _   _ __   _ __ _ _  __ __   _ __   __ _  History of Present Illness  Calvin [name] is a 76 y.o. male with PMH significant for BPH, hypertension, nephrolithiasis, alcohol use who presents with altered mental status.  It seems like his neighbor helps him out and noted that patient was confused and altered and called EMS.  EMS found patient sitting on the floor next to his wheelchair and concerned that he probably slid out of his wheelchair.   Patient reports that he got out of his wheel chair to transfer to the couch and was unable to get to the couch and helpde himself to the ground. He was noted to be hypotensive and hypoxic in the field which resolved and his confusion is somewhat improved.  He was brought into the ED where workup demonstrated an acute right pontine infarct on the CT head.  MRI brain without contrast confirmed the noted pontine stroke.   Neurology consulted for further assistance with stroke workup.  He denies any prior history of strokes, endorses his father and multiple strokes and passed away from a stroke.  Denies any history of diabetes.  Endorses history of hypertension and hyperlipidemia.  He does not smoke, does not use any recreational substances.  LKW: 05/25/22 mRS: 3 tNKASE: not offered, outside window Thrombectomy: not offered, low suspicion for LVO NIHSS components Score: Comment  1a Level of Conscious 0'[x]'$  1'[]'$  2'[]'$  3'[]'$      1b LOC Questions 0'[x]'$  1'[]'$  2'[]'$       1c LOC Commands 0'[x]'$  1'[]'$  2'[]'$       2 Best Gaze 0'[x]'$  1'[]'$  2'[]'$       3 Visual 0'[x]'$  1'[]'$  2'[]'$  3'[]'$      4 Facial Palsy 0'[x]'$  1'[]'$  2'[]'$  3'[]'$      5a Motor Arm - left 0'[x]'$  1'[]'$  2'[]'$  3'[]'$  4'[]'$  UN'[]'$    5b Motor Arm - Right 0'[x]'$  1'[]'$  2'[]'$  3'[]'$  4'[]'$  UN'[]'$    6a Motor Leg - Left 0'[]'$  1'[]'$  2'[x]'$  3'[]'$  4'[]'$  UN'[]'$    6b Motor Leg  - Right 0'[x]'$  1'[]'$  2'[]'$  3'[]'$  4'[]'$  UN'[]'$    7 Limb Ataxia 0'[x]'$  1'[]'$  2'[]'$  3'[]'$  UN'[]'$     8 Sensory 0'[x]'$  1'[]'$  2'[]'$  UN'[]'$      9 Best Language 0'[x]'$  1'[]'$  2'[]'$  3'[]'$      10 Dysarthria 0'[x]'$  1'[]'$  2'[]'$  UN'[]'$      11 Extinct. and Inattention 0'[x]'$  1'[]'$  2'[]'$       TOTAL: 2        ROS   Constitutional Denies weight loss, fever and chills.   HEENT Denies changes in vision and hearing.   Respiratory Denies SOB and cough.   CV Denies palpitations and CP   GI Denies abdominal pain, nausea, vomiting and diarrhea.   GU Denies dysuria and urinary frequency.   MSK Denies myalgia and joint pain.   Skin Denies rash and pruritus.   Neurological Denies headache and syncope.   Psychiatric Denies recent changes in mood. Denies anxiety and depression.    Past History   Past Medical History:  Diagnosis Date   Benign prostatic hypertrophy    COVID-19 virus infection 02/24/2020   ED (erectile dysfunction)    Headache(784.0)    Hypertension    Hypogonadism male    Low back pain 06/09/2020  Nephrolithiasis    hx of had lithotripsy in 1-10.   NEPHROLITHIASIS, HX OF 10/25/2008   Qualifier: Diagnosis of   By: Sarajane Jews MD, Ishmael Holter      Pyelonephritis 02/25/2022   Past Surgical History:  Procedure Laterality Date   colonoscopy  10/05/2020   per Dr. Fuller Plan, adenomatous polyps, repeat in 3 yrs   FOOT SURGERY     LAPAROSCOPIC APPENDECTOMY  03/12/2012   Procedure: APPENDECTOMY LAPAROSCOPIC;  Surgeon: Harl Bowie, MD;  Location: [facility];  Service: General;  Laterality: N/A;   STAPLE HEMORRHOIDECTOMY  04/14/2010   per Dr. Johney Maine    TONSILLECTOMY     Family History  Problem Relation Age of Onset   Heart Problems Mother        Caused by a Virus when she was a teen   Stroke Father    Prostate cancer Father    Hypertension Other    Stroke Other    Heart disease Other        rheumatic    Coronary artery disease Other    Healthy Child    Colon cancer Neg Hx    Colon polyps Neg Hx    Esophageal cancer Neg Hx    Rectal cancer  Neg Hx    Stomach cancer Neg Hx    Social History   Socioeconomic History   Marital status: Widowed    Spouse name: Not on file   Number of children: Not on file   Years of education: Not on file   Highest education level: Not on file  Occupational History   Occupation: retired    Comment: owned [employer]  Tobacco Use   Smoking status: Never   Smokeless tobacco: Never  Vaping Use   Vaping Use: Never used  Substance and Sexual Activity   Alcohol use: Yes    Alcohol/week: 14.0 standard drinks of alcohol    Types: 14 Standard drinks or equivalent per week    Comment: quit 3 weeks ago   Drug use: No   Sexual activity: Not on file  Other Topics Concern   Not on file  Social History Narrative   Right Handed    Lives in a one story home with a basement.    Social Determinants of Health   Financial Resource Strain: Low Risk  (04/14/2020)   Overall Financial Resource Strain (CARDIA)    Difficulty of Paying Living Expenses: Not hard at all  Food Insecurity: No Food Insecurity (02/25/2022)   Hunger Vital Sign    Worried About Running Out of Food in the Last Year: Never true    Ran Out of Food in the Last Year: Never true  Transportation Needs: No Transportation Needs (02/25/2022)   PRAPARE - Hydrologist (Medical): No    Lack of Transportation (Non-Medical): No  Physical Activity: Inactive (03/08/2021)   Exercise Vital Sign    Days of Exercise per Week: 0 days    Minutes of Exercise per Session: 0 min  Stress: No Stress Concern Present (04/14/2020)   [place]    Feeling of Stress : Not at all  Social Connections: Socially Isolated (03/08/2021)   Social Connection and Isolation Panel [NHANES]    Frequency of Communication with Friends and Family: Once a week    Frequency of Social Gatherings with Friends and Family: Once a week    Attends Religious Services: Never    Building control surveyor of  Clubs or Organizations: No    Attends Archivist Meetings: Never    Marital Status: Widowed   No Known Allergies  Medications  (Not in a hospital admission)    Vitals   Vitals:   05/27/22 1948 05/27/22 2045 05/27/22 2100 05/27/22 2130  BP: (!) 165/119 (!) 165/108 (!) 158/105 (!) 162/119  Pulse: (!) 106 100 (!) 106 (!) 102  Resp:  16 (!) 22 (!) 22  Temp:      TempSrc:      SpO2:  95% 97% 94%     There is no height or weight on file to calculate BMI.  Physical Exam   General: Laying comfortably in bed; in no acute distress.  HENT: Normal oropharynx and mucosa. Normal external appearance of ears and nose.  Neck: Supple, no pain or tenderness  CV: No JVD. No peripheral edema.  Pulmonary: Symmetric Chest rise. Normal respiratory effort. Abdomen: Soft to touch, non-tender.  Ext: No cyanosis, edema, or deformity  Skin: No rash. Normal palpation of skin.   Musculoskeletal: Normal digits and nails by inspection. No clubbing.   Neurologic Examination  Mental status/Cognition: Alert, oriented to self, place, month and year, good attention.  Speech/language: Fluent, comprehension intact, object naming intact, repetition intact.  Cranial nerves:   CN II Pupils equal and reactive to light, no VF deficits   CN III,IV,VI EOM intact, no gaze preference or deviation, no nystagmus    CN V normal sensation in V1, V2, and V3 segments bilaterally    CN VII no asymmetry, no nasolabial fold flattening    CN VIII normal hearing to speech    CN IX & X normal palatal elevation, no uvular deviation    CN XI 5/5 head turn and 5/5 shoulder shrug bilaterally    CN XII midline tongue protrusion    Motor:  Muscle bulk: normal, tone normal, pronator drift none tremor none Mvmt Root Nerve  Muscle Right Left Comments  SA C5/6 Ax Deltoid 5 5   EF C5/6 Mc Biceps 5 5   EE C6/7/8 Rad Triceps 5 5   WF C6/7 Med FCR     WE C7/8 PIN ECU     F Ab C8/T1 U ADM/FDI 5 5   HF L1/2/3 Fem  Illopsoas 5 3   KE L2/3/4 Fem Quad 5    DF L4/5 D Peron Tib Ant 5 4   PF S1/2 Tibial Grc/Sol 5 4    Sensation:  Light touch Intact throughout   Pin prick    Temperature    Vibration   Proprioception    Coordination/Complex Motor:  - Finger to Nose intact bilaterally - Heel to shin unable to do - Rapid alternating movement are slow in the left leg. - Gait: Deferred for patient's safety  Labs   CBC:  Recent Labs  Lab 05/27/22 1215 05/27/22 1222  WBC 8.3  --   NEUTROABS 5.4  --   HGB 13.9 13.6  HCT 40.5 40.0  MCV 94.4  --   PLT 283  --     Basic Metabolic Panel:  Lab Results  Component Value Date   NA 143 05/27/2022   K 3.5 05/27/2022   CO2 23 05/27/2022   GLUCOSE 105 (H) 05/27/2022   BUN 9 05/27/2022   CREATININE 1.00 05/27/2022   CALCIUM 9.0 05/27/2022   GFRNONAA >60 05/27/2022   GFRAA >60 01/21/2016   Lipid Panel:  Lab Results  Component Value Date   LDLCALC 60 04/25/2021  HgbA1c:  Lab Results  Component Value Date   HGBA1C 5.7 12/25/2021   Urine Drug Screen:     Component Value Date/Time   LABOPIA NONE DETECTED 05/27/2022 1544   COCAINSCRNUR NONE DETECTED 05/27/2022 1544   LABBENZ NONE DETECTED 05/27/2022 1544   AMPHETMU NONE DETECTED 05/27/2022 1544   THCU NONE DETECTED 05/27/2022 1544   LABBARB NONE DETECTED 05/27/2022 1544    Alcohol Level     Component Value Date/Time   ETH 214 (H) 05/27/2022 1215    CT Head without contrast(Personally reviewed): R pontine stroke.  CT angio Head and Neck with contrast(Personally reviewed): Pending.  MRI Brain(Personally reviewed): R pontine stroke  Impression   Calvin [name] is a 76 y.o. male with PMH significant for BPH, hypertension, nephrolithiasis, alcohol use who presents with confusion, hypotension that resolved. Endorses falling when trying to transfer from wheelchair to the couch but helpde himself to the ground and did not hit his head. On exam, has LLE weakness that he attributes to  his fracture but seems to be diffusely weak on the left with left leg incoordination and I do not think that the entirety of this is explain by the fracture. Likely the noted R pontine stroke is contributing. He was outside the window for TNK or thrombectomy arrival.  Primary Diagnosis:  Other cerebral infarction due to occlusion of stenosis of small artery.  Secondary Diagnosis: Essential (primary) hypertension  Recommendations   - Frequent Neuro checks per stroke unit protocol - Recommend brain imaging with MRI Brain without contrast - Recommend Vascular imaging with CT angio head and neck - Recommend obtaining TTE - Recommend obtaining Lipid panel with LDL - Please start statin if LDL > 70 - Recommend HbA1c - Antithrombotic -aspirin 81 mg daily along with Plavix 75 mg daily for 21 days, followed by aspirin 81 mg daily alone. - Recommend DVT ppx - SBP goal - permissive hypertension first 24 h < 220/110. Held home meds.  - Recommend Telemetry monitoring for arrythmia - Recommend bedside swallow screen prior to PO intake. - Stroke education booklet - Recommend PT/OT/SLP consult   ______________________________________________________________________   Thank you for the opportunity to take part in the care of this patient. If you have any further questions, please contact the neurology consultation attending.  Signed,  [place] Pager Number [phone] _ _ _   _ __   _ __ _ _  __ __   _ __   __ _

## 2022-05-27 NOTE — ED Triage Notes (Signed)
Pt BIB EMS due to AMS. Pt slid out of wheelchair and was found on floor hunched over by ems. Per ems, pt having slurred speech and left sided weakness. Sx resolved on arrival.

## 2022-05-27 NOTE — ED Provider Notes (Signed)
[facility] EMERGENCY DEPARTMENT Provider Note   CSN: [id number] Arrival date & time: 05/27/22  1132     History  Chief Complaint  Patient presents with   Altered Mental Status    Calvin [name] is a 76 y.o. male.   Altered Mental Status Presenting symptoms: confusion   Associated symptoms: weakness   Patient presents for altered mental status.  Per EMS, he reportedly lives alone but has frequent help from his neighbor.  His neighbor was present on their arrival.  When EMS arrived on scene, patient was sitting on the floor at the base of his wheelchair.  They suspect that he slid out of his wheelchair and had unknown downtime on the ground.  He was slumped over and minimally responsive.  He was hypoxic and hypotensive.  When they sat him up, breathing improved as did his mentation.  On arrival, he denies any areas of discomfort.  He does report that he continues to drink.  He believes that his last drink was 2 days ago.     Home Medications Prior to Admission medications   Medication Sig Start Date End Date Taking? Authorizing Provider  amLODipine (NORVASC) 5 MG tablet TAKE 1 TABLET (5 MG TOTAL) BY MOUTH DAILY. 05/03/22   Laurey Morale, MD  atorvastatin (LIPITOR) 20 MG tablet TAKE 1 TABLET BY MOUTH EVERY DAY 05/03/22   Laurey Morale, MD  cyanocobalamin (VITAMIN B12) 1000 MCG/ML injection INJECT 1 ML (1,000 MCG TOTAL) INTO THE MUSCLE ONCE A WEEK. 03/13/22   Laurey Morale, MD  finasteride (PROSCAR) 5 MG tablet Take 1 tablet (5 mg total) by mouth daily. 04/25/21   Laurey Morale, MD  loperamide (IMODIUM) 2 MG capsule Take 1 capsule (2 mg total) by mouth every 8 (eight) hours as needed for diarrhea or loose stools. 03/21/22   Aline August, MD  losartan (COZAAR) 100 MG tablet TAKE 1 TABLET BY MOUTH EVERY DAY 05/03/22   Laurey Morale, MD  sertraline (ZOLOFT) 100 MG tablet TAKE 1 TABLET BY MOUTH EVERY DAY 05/03/22   Laurey Morale, MD  sertraline (ZOLOFT) 50 MG  tablet Take 50 mg by mouth daily. Take with '100mg'$  tablet for a total dose of '150mg'$  daily for 30 days 03/01/22   [provider]  SYRINGE-NEEDLE, DISP, 3 ML (BD ECLIPSE SYRINGE/NEEDLE) 25G X 5/8" 3 ML MISC Use as directed once a week 12/26/21   Laurey Morale, MD  tamsulosin (FLOMAX) 0.4 MG CAPS capsule TAKE 1 CAPSULE BY MOUTH EVERY DAY 05/03/22   Laurey Morale, MD      Allergies    Patient has no known allergies.    Review of Systems   Review of Systems  Unable to perform ROS: Mental status change  Neurological:  Positive for weakness.  Psychiatric/Behavioral:  Positive for confusion.     Physical Exam Updated Vital Signs BP (!) 143/102   Pulse 100   Temp 98.2 F (36.8 C) (Oral)   Resp 19   SpO2 95%  Physical Exam Vitals and nursing note reviewed.  Constitutional:      General: He is not in acute distress.    Appearance: He is well-developed. He is not ill-appearing, toxic-appearing or diaphoretic.  HENT:     Head: Normocephalic and atraumatic.     Right Ear: External ear normal.     Left Ear: External ear normal.     Nose: Nose normal.     Mouth/Throat:  Mouth: Mucous membranes are moist.     Pharynx: Oropharynx is clear.  Eyes:     General: No visual field deficit.    Extraocular Movements: Extraocular movements intact.     Conjunctiva/sclera: Conjunctivae normal.  Cardiovascular:     Rate and Rhythm: Normal rate and regular rhythm.     Heart sounds: No murmur heard. Pulmonary:     Effort: Pulmonary effort is normal. No respiratory distress.     Breath sounds: Normal breath sounds. No wheezing or rales.  Chest:     Chest wall: No tenderness.  Abdominal:     General: There is no distension.     Palpations: Abdomen is soft.     Tenderness: There is no abdominal tenderness.  Genitourinary:    Comments: A Foley catheter is present.  It appears the patient has had a traumatic hypospadias.  Blood is present in brief. Musculoskeletal:        General: No  swelling.     Cervical back: Normal range of motion and neck supple.     Right lower leg: No edema.     Left lower leg: No edema.  Skin:    General: Skin is warm and dry.     Capillary Refill: Capillary refill takes less than 2 seconds.     Coloration: Skin is not jaundiced or pale.  Neurological:     Mental Status: He is alert and oriented to person, place, and time.     Cranial Nerves: Cranial nerves 2-12 are intact. No cranial nerve deficit, dysarthria or facial asymmetry.     Sensory: Sensation is intact. No sensory deficit.     Motor: Pronator drift present.     Coordination: Coordination is intact.     Comments: NIHSS 3  Psychiatric:        Mood and Affect: Mood normal.     ED Results / Procedures / Treatments   Labs (all labs ordered are listed, but only abnormal results are displayed) Labs Reviewed  ETHANOL - Abnormal; Notable for the following components:      Result Value   Alcohol, Ethyl (B) 214 (*)    All other components within normal limits  DIFFERENTIAL - Abnormal; Notable for the following components:   Abs Immature Granulocytes 0.08 (*)    All other components within normal limits  COMPREHENSIVE METABOLIC PANEL - Abnormal; Notable for the following components:   Potassium 3.4 (*)    Glucose, Bld 107 (*)    All other components within normal limits  URINALYSIS, ROUTINE W REFLEX MICROSCOPIC - Abnormal; Notable for the following components:   APPearance CLOUDY (*)    Hgb urine dipstick MODERATE (*)    Nitrite POSITIVE (*)    Leukocytes,Ua LARGE (*)    Bacteria, UA FEW (*)    All other components within normal limits  LACTIC ACID, PLASMA - Abnormal; Notable for the following components:   Lactic Acid, Venous 2.1 (*)    All other components within normal limits  LACTIC ACID, PLASMA - Abnormal; Notable for the following components:   Lactic Acid, Venous 2.1 (*)    All other components within normal limits  I-STAT CHEM 8, ED - Abnormal; Notable for the  following components:   Glucose, Bld 105 (*)    Calcium, Ion 1.07 (*)    All other components within normal limits  CULTURE, BLOOD (ROUTINE X 2)  CULTURE, BLOOD (ROUTINE X 2)  URINE CULTURE  PROTIME-INR  APTT  CBC  RAPID URINE DRUG  SCREEN, HOSP PERFORMED  MAGNESIUM  CK    EKG EKG Interpretation  Date/Time:  Sunday May 27 2022 11:43:09 EST Ventricular Rate:  92 PR Interval:  160 QRS Duration: 93 QT Interval:  392 QTC Calculation: 485 R Axis:   15 Text Interpretation: Sinus rhythm Low voltage, extremity leads Abnormal R-wave progression, early transition Borderline prolonged QT interval Confirmed by Godfrey Pick ([id number]) on 05/27/2022 1:05:14 PM  Radiology MR BRAIN WO CONTRAST  Result Date: 05/27/2022 CLINICAL DATA:  Neuro deficit, acute, stroke suspected. Fell from wheelchair. Slurred speech and left-sided weakness. EXAM: MRI HEAD WITHOUT CONTRAST TECHNIQUE: Multiplanar, multiecho pulse sequences of the brain and surrounding structures were obtained without intravenous contrast. COMPARISON:  Head CT earlier same day FINDINGS: Brain: Diffusion imaging confirms the presence of an acute stroke in the right para median pons. No gross swelling or hemorrhage. No other acute infarction. No focal cerebellar insult. Cerebral hemispheres show generalized atrophy with low moderate chronic small-vessel ischemic changes of the white matter. No supra tentorial large vessel stroke. No mass, hemorrhage, hydrocephalus or extra-axial collection. Vascular: Major vessels at the base of the brain show flow. Skull and upper cervical spine: Negative Sinuses/Orbits: Inflammatory changes of the sphenoid sinus. The other sinuses are clear. Orbits negative. Other: None IMPRESSION: 1. Acute infarction in the right para median pons. No swelling or hemorrhage. 2. Generalized atrophy. Moderate chronic small-vessel ischemic changes of the cerebral hemispheric white matter. Electronically Signed   By: Nelson Chimes  M.D.   On: 05/27/2022 17:16   CT HEAD WO CONTRAST  Result Date: 05/27/2022 CLINICAL DATA:  Multiple neuro deficit, acute, stroke suspected. Mental status change. EXAM: CT HEAD WITHOUT CONTRAST TECHNIQUE: Contiguous axial images were obtained from the base of the skull through the vertex without intravenous contrast. RADIATION DOSE REDUCTION: This exam was performed according to the departmental dose-optimization program which includes automated exposure control, adjustment of the mA and/or kV according to patient size and/or use of iterative reconstruction technique. COMPARISON:  MRI 07/29/2021 FINDINGS: Brain: Low-density in the right para median pons consistent with stroke. This is of indeterminate age but was not visible in February of this year. No focal cerebellar insult. Cerebral hemispheres show atrophy, ex vacuo enlargement of the lateral ventricles, and chronic small vessel change of the white matter. No large vessel territory stroke is visible. No mass or extra-axial collection. No hemorrhage. Vascular: There is atherosclerotic calcification of the major vessels at the base of the brain. Skull: No skull fracture. Sinuses/Orbits: Chronic inflammatory changes of the sphenoid sinus. Other paranasal sinuses are clear. Mastoid air cells are clear. Orbits negative. Other: None IMPRESSION: 1. Low-density in the right para median pons consistent with stroke. This is of indeterminate age but was not visible in February of this year and therefore could be recent. 2. Atrophy and chronic small-vessel ischemic changes of the white matter. 3. Chronic inflammatory changes of the sphenoid sinus. Electronically Signed   By: Nelson Chimes M.D.   On: 05/27/2022 14:03   DG Chest Portable 1 View  Result Date: 05/27/2022 CLINICAL DATA:  Hypoxia EXAM: PORTABLE CHEST 1 VIEW COMPARISON:  01/19/2016 FINDINGS: Artifact overlies the chest. Mild cardiomegaly. Mild aortic tortuosity. The lungs are clear. No edema, infiltrate,  collapse or effusion. IMPRESSION: No active disease. Mild cardiomegaly and aortic tortuosity. Electronically Signed   By: Nelson Chimes M.D.   On: 05/27/2022 12:58    Procedures Procedures    Medications Ordered in ED Medications  ceFEPIme (MAXIPIME) 2 g in sodium chloride  0.9 % 100 mL IVPB (has no administration in time range)  aspirin EC tablet 325 mg (has no administration in time range)  clopidogrel (PLAVIX) tablet 75 mg (has no administration in time range)  lactated ringers bolus 1,000 mL (0 mLs Intravenous Stopped 05/27/22 1539)  thiamine (VITAMIN B1) injection 100 mg (100 mg Intravenous Given 05/27/22 1235)    ED Course/ Medical Decision Making/ A&P                           Medical Decision Making Amount and/or Complexity of Data Reviewed Labs: ordered. Radiology: ordered.  Risk OTC drugs. Prescription drug management.   This patient presents to the ED for concern of altered mental status, this involves an extensive number of treatment options, and is a complaint that carries with it a high risk of complications and morbidity.  The differential diagnosis includes CVA, ICH, seizure, intoxication, withdrawal, infection, metabolic derangements   Co morbidities that complicate the patient evaluation  HTN, enlarged prostate, depression, anxiety, alcohol abuse   Additional history obtained:  Additional history obtained from EMS External records from outside source obtained and reviewed including EMR   Lab Tests:  I Ordered, and personally interpreted labs.  The pertinent results include: Elevated ethanol level, normal kidney function, normal electrolytes, 1, no leukocytosis.  Urinalysis is consistent with UTI.   Imaging Studies ordered:  I ordered imaging studies including chest x-ray, CT head, MRI brain I independently visualized and interpreted imaging which showed acute right pontine stroke I agree with the radiologist interpretation   Cardiac Monitoring: /  EKG:  The patient was maintained on a cardiac monitor.  I personally viewed and interpreted the cardiac monitored which showed an underlying rhythm of: Sinus rhythm   Consultations Obtained:  I requested consultation with the urologist, Dr. Alinda Money,  and discussed lab and imaging findings as well as pertinent plan - they recommend: No acute management for traumatic hypospadias is indicated.  Patient currently has follow-up appointment scheduled with urology on January 3.  If patient does get admitted, urology can be consulted nonemergently. I requested consultation with the neurologist, Dr. Quinn Axe,  and discussed lab and imaging findings as well as pertinent plan - they recommend: Aspirin, Plavix, permissive hypertension up to 220 SBP, admission to medicine   Problem List / ED Course / Critical interventions / Medication management  Patient is a 76 year old male presenting from home by EMS due to altered mental status and generalized weakness.  On their arrival, patient was seated on the floor at the base of his wheelchair.  He was hypoxic and hypotensive.  He was mentally responsive.  He was slumped forward and EMS felt that he may have occluded his airway while slumped in that position.  His breathing and mentation improved when repositioned.  On arrival in the ED, he is alert and oriented.  He is not able to provide history.  He does state that he continues to drink.  He believes his last drink was 2 days ago.  Physical exam is notable for what appears to be traumatic hypospadias from his Foley catheter.  Neuroexam is notable for left arm and leg pronator drift.  He denies any sensory deficits.  He has lateral nystagmus on ocular exam.  He has no visual field deficits.  He does not seem to have any speech deficits.  Per EMS, neighbor saw him well at 11 PM last night.  Although patient does have focal neurologic deficits,  exam not consistent with LVO.  Noncontrasted CT scan of head as well as MRI were  ordered.  I spoke with urologist on-call, Dr. Alinda Money, regarding traumatic hypospadias.  Dr. Alinda Money reviewed urology notes and it does appear that patient's Foley catheter was exchanged to 1.5 weeks ago.  He stated that there is no need for acute management of the traumatic hypospadias.  Urology can follow in consult the patient is admitted.  The patient goes home, he has a scheduled follow-up appointment on January 3.  Noncontrasted CT of head showed an age-indeterminate low-density in right paramedian pons.  Lab work is notable for elevated ethanol level.  Urinalysis shows pyuria, bacteriuria, and nitrates.  Cefepime was ordered for empiric treatment of UTI.  Urine cultures were sent.  MRI showed confirmation of acute right pontine stroke.  Findings were discussed with neurologist on-call, Dr. Quinn Axe.  Recommendations are for aspirin, Plavix, permissive hypertension, and admission to medicine.  Patient was admitted for further management. I ordered medication including IV fluids and thiamine for alcohol intoxication, aspirin and Plavix for acute stroke, cefepime for UTI Reevaluation of the patient after these medicines showed that the patient improved I have reviewed the patients home medicines and have made adjustments as needed   Social Determinants of Health:  Lives independently         Final Clinical Impression(s) / ED Diagnoses Final diagnoses:  Cystitis  Right pontine stroke ([place])  Alcoholic intoxication without complication [facility]    Rx / DC Orders ED Discharge Orders     None         Godfrey Pick, MD 05/27/22 1747

## 2022-05-27 NOTE — H&P (Signed)
History and Physical   Calvin [name] BPZ:[id number] DOB: 11-20-1945 DOA: 05/27/2022  PCP: Laurey Morale, MD   Patient coming from: Home  Chief Complaint: Altered mental status  HPI: Calvin [name] is a 76 y.o. male with medical history significant of UTI, Foley, BPH with bladder outlet obstruction, renal stones, hypertension, depression, anxiety, alcohol use presenting with altered mental status.  Patient lives alone but his neighbor helps him regularly per EMS.  His neighbor was with him when EMS arrived.  He tells me that this "neighbor "actually lives with him and used to take care of people was part of their profession.  Concern for altered mentation and weakness.  He was last normal last night.  On EMS arrival patient found sitting on the floor near his wheelchair and there is suspicion that he slid out of the wheelchair.  Unknown downtime, was found slumped over and minimally responsive at first.  There was initial concern for hypotension and hypoxia.  His hypoxia seemed to improve as well as his mentation somewhat on standing.  Subsequently transported to the ED for further evaluation.  Denies fevers, chills, chest pain, abdominal pain, constipation, diarrhea, nausea.  ED Course: Vital signs in the ED significant for heart rate in the 90s to 100s, blood pressure in the 242P to 536R systolic, respiratory rate in the teens to 20s.  Lab workup included CMP with potassium 3.4, glucose 107.  CBC within normal limits.  PT, PTT, INR within normal limits.  Magnesium normal.  CK level normal.  Lactic acid borderline at 2.1 x 2.  Ethanol level elevated to 14.  UDS negative.  Urinalysis with hemoglobin, nitrates, leukocytes, bacteria.  Urine culture and blood culture pending.  Chest x-ray showed no acute normality, cardiomegaly.  CT head showed changes consistent with acute stroke at the right pons.  MRI confirmed acute infarct in the right pons.  Patient received aspirin, Plavix, cefepime, thiamine,  liter fluids in the ED.  Urology was consulted as it appeared patient had traumatic hypospadias in the setting of indwelling Foley catheter.  They said there is nothing to do for this and for him to follow-up as scheduled on January 3.  They state they can be reconsulted if needed.  Neurology consulted for acute stroke and will see the patient recommending usual acute stroke workup.  Review of Systems: As per HPI otherwise all other systems reviewed and are negative.  Past Medical History:  Diagnosis Date   Benign prostatic hypertrophy    COVID-19 virus infection 02/24/2020   ED (erectile dysfunction)    Headache(784.0)    Hypertension    Hypogonadism male    Low back pain 06/09/2020   Nephrolithiasis    hx of had lithotripsy in 1-10.   NEPHROLITHIASIS, HX OF 10/25/2008   Qualifier: Diagnosis of   By: Sarajane Jews MD, Ishmael Holter      Pyelonephritis 02/25/2022    Past Surgical History:  Procedure Laterality Date   colonoscopy  10/05/2020   per Dr. Fuller Plan, adenomatous polyps, repeat in 3 yrs   [place]  03/12/2012   Procedure: APPENDECTOMY LAPAROSCOPIC;  Surgeon: Harl Bowie, MD;  Location: [facility];  Service: General;  Laterality: N/A;   STAPLE HEMORRHOIDECTOMY  04/14/2010   per Dr. Johney Maine    TONSILLECTOMY      Social History  reports that he has never smoked. He has never used smokeless tobacco. He reports current alcohol use of about 14.0 standard drinks  of alcohol per week. He reports that he does not use drugs.  No Known Allergies  Family History  Problem Relation Age of Onset   Heart Problems Mother        Caused by a Virus when she was a teen   Stroke Father    Prostate cancer Father    Hypertension Other    Stroke Other    Heart disease Other        rheumatic    Coronary artery disease Other    Healthy Child    Colon cancer Neg Hx    Colon polyps Neg Hx    Esophageal cancer Neg Hx    Rectal cancer Neg Hx    Stomach cancer Neg Hx    Reviewed on admission  Prior to Admission medications   Medication Sig Start Date End Date Taking? Authorizing Provider  cyclobenzaprine (FLEXERIL) 10 MG tablet Take 10 mg by mouth 3 (three) times daily as needed for muscle spasms. 03/04/22  Yes [provider]  KLOR-CON M10 10 MEQ tablet Take 10 mEq by mouth 2 (two) times daily. 03/22/22  Yes [provider]  amLODipine (NORVASC) 5 MG tablet TAKE 1 TABLET (5 MG TOTAL) BY MOUTH DAILY. 05/03/22   Laurey Morale, MD  atorvastatin (LIPITOR) 20 MG tablet TAKE 1 TABLET BY MOUTH EVERY DAY 05/03/22   Laurey Morale, MD  cyanocobalamin (VITAMIN B12) 1000 MCG/ML injection INJECT 1 ML (1,000 MCG TOTAL) INTO THE MUSCLE ONCE A WEEK. 03/13/22   Laurey Morale, MD  finasteride (PROSCAR) 5 MG tablet Take 1 tablet (5 mg total) by mouth daily. 04/25/21   Laurey Morale, MD  loperamide (IMODIUM) 2 MG capsule Take 1 capsule (2 mg total) by mouth every 8 (eight) hours as needed for diarrhea or loose stools. 03/21/22   Aline August, MD  losartan (COZAAR) 100 MG tablet TAKE 1 TABLET BY MOUTH EVERY DAY 05/03/22   Laurey Morale, MD  sertraline (ZOLOFT) 100 MG tablet TAKE 1 TABLET BY MOUTH EVERY DAY 05/03/22   Laurey Morale, MD  sertraline (ZOLOFT) 50 MG tablet Take 50 mg by mouth daily. Take with '100mg'$  tablet for a total dose of '150mg'$  daily for 30 days 03/01/22   [provider]  SYRINGE-NEEDLE, DISP, 3 ML (BD ECLIPSE SYRINGE/NEEDLE) 25G X 5/8" 3 ML MISC Use as directed once a week 12/26/21   Laurey Morale, MD  tamsulosin (FLOMAX) 0.4 MG CAPS capsule TAKE 1 CAPSULE BY MOUTH EVERY DAY 05/03/22   Laurey Morale, MD    Physical Exam: Vitals:   05/27/22 1545 05/27/22 1600 05/27/22 1615 05/27/22 1810  BP: (!) 136/101 (!) 142/104 (!) 143/102 (!) 165/119  Pulse: (!) 105 98 100 (!) 102  Resp: 19 (!) '21 19 17  '$ Temp:      TempSrc:      SpO2: 95% 95% 95% 98%    Physical Exam Constitutional:      General: He is not in acute distress.     Appearance: Normal appearance.  HENT:     Head: Normocephalic and atraumatic.     Mouth/Throat:     Mouth: Mucous membranes are moist.     Pharynx: Oropharynx is clear.  Eyes:     Extraocular Movements: Extraocular movements intact.     Pupils: Pupils are equal, round, and reactive to light.  Cardiovascular:     Rate and Rhythm: Regular rhythm. Tachycardia present.     Pulses: Normal pulses.  Heart sounds: Normal heart sounds.  Pulmonary:     Effort: Pulmonary effort is normal. No respiratory distress.     Breath sounds: Normal breath sounds.  Abdominal:     General: Bowel sounds are normal. There is no distension.     Palpations: Abdomen is soft.     Tenderness: There is no abdominal tenderness.  Musculoskeletal:        General: No swelling or deformity.  Skin:    General: Skin is warm and dry.  Neurological:     Comments: Mental Status: Patient is awake, alert, oriented No signs of aphasia or neglect Cranial Nerves: II: Pupils equal, round, and reactive to light.   III,IV, VI: EOMI without ptosis or diploplia.  V: Facial sensation is symmetric to light touch. VII: Facial movement is symmetric.  VIII: hearing is intact to voice X: Uvula elevates symmetrically XI: Shoulder shrug is symmetric. XII: tongue is midline without atrophy or fasciculations.  Motor: Good effort thorughout, at Least 5/5 bilateral UE, 5/5 bilateral lower extremitiy  Sensory: Sensation is grossly intact bilateral UEs & LEs Cerebellar: Finger-Nose intact bilalat    Labs on Admission: I have personally reviewed following labs and imaging studies  CBC: Recent Labs  Lab 05/27/22 1215 05/27/22 1222  WBC 8.3  --   NEUTROABS 5.4  --   HGB 13.9 13.6  HCT 40.5 40.0  MCV 94.4  --   PLT 283  --     Basic Metabolic Panel: Recent Labs  Lab 05/27/22 1215 05/27/22 1222  NA 141 143  K 3.4* 3.5  CL 106 105  CO2 23  --   GLUCOSE 107* 105*  BUN 9 9  CREATININE 0.79 1.00  CALCIUM 9.0  --   MG  1.9  --     GFR: CrCl cannot be calculated (Unknown ideal weight.).  Liver Function Tests: Recent Labs  Lab 05/27/22 1215  AST 23  ALT 19  ALKPHOS 88  BILITOT 0.6  PROT 7.1  ALBUMIN 3.6    Urine analysis:    Component Value Date/Time   COLORURINE YELLOW 05/27/2022 1544   APPEARANCEUR CLOUDY (A) 05/27/2022 1544   LABSPEC 1.006 05/27/2022 1544   PHURINE 8.0 05/27/2022 1544   GLUCOSEU NEGATIVE 05/27/2022 1544   HGBUR MODERATE (A) 05/27/2022 1544   HGBUR negative 12/14/2009 0823   BILIRUBINUR NEGATIVE 05/27/2022 1544   BILIRUBINUR neg 09/14/2020 1459   KETONESUR NEGATIVE 05/27/2022 1544   PROTEINUR NEGATIVE 05/27/2022 1544   UROBILINOGEN 1.0 09/14/2020 1459   UROBILINOGEN 1.0 03/12/2012 1250   NITRITE POSITIVE (A) 05/27/2022 1544   LEUKOCYTESUR LARGE (A) 05/27/2022 1544    Radiological Exams on Admission: MR BRAIN WO CONTRAST  Result Date: 05/27/2022 CLINICAL DATA:  Neuro deficit, acute, stroke suspected. Fell from wheelchair. Slurred speech and left-sided weakness. EXAM: MRI HEAD WITHOUT CONTRAST TECHNIQUE: Multiplanar, multiecho pulse sequences of the brain and surrounding structures were obtained without intravenous contrast. COMPARISON:  Head CT earlier same day FINDINGS: Brain: Diffusion imaging confirms the presence of an acute stroke in the right para median pons. No gross swelling or hemorrhage. No other acute infarction. No focal cerebellar insult. Cerebral hemispheres show generalized atrophy with low moderate chronic small-vessel ischemic changes of the white matter. No supra tentorial large vessel stroke. No mass, hemorrhage, hydrocephalus or extra-axial collection. Vascular: Major vessels at the base of the brain show flow. Skull and upper cervical spine: Negative Sinuses/Orbits: Inflammatory changes of the sphenoid sinus. The other sinuses are clear. Orbits negative. Other: None  IMPRESSION: 1. Acute infarction in the right para median pons. No swelling or  hemorrhage. 2. Generalized atrophy. Moderate chronic small-vessel ischemic changes of the cerebral hemispheric white matter. Electronically Signed   By: Nelson Chimes M.D.   On: 05/27/2022 17:16   CT HEAD WO CONTRAST  Result Date: 05/27/2022 CLINICAL DATA:  Multiple neuro deficit, acute, stroke suspected. Mental status change. EXAM: CT HEAD WITHOUT CONTRAST TECHNIQUE: Contiguous axial images were obtained from the base of the skull through the vertex without intravenous contrast. RADIATION DOSE REDUCTION: This exam was performed according to the departmental dose-optimization program which includes automated exposure control, adjustment of the mA and/or kV according to patient size and/or use of iterative reconstruction technique. COMPARISON:  MRI 07/29/2021 FINDINGS: Brain: Low-density in the right para median pons consistent with stroke. This is of indeterminate age but was not visible in February of this year. No focal cerebellar insult. Cerebral hemispheres show atrophy, ex vacuo enlargement of the lateral ventricles, and chronic small vessel change of the white matter. No large vessel territory stroke is visible. No mass or extra-axial collection. No hemorrhage. Vascular: There is atherosclerotic calcification of the major vessels at the base of the brain. Skull: No skull fracture. Sinuses/Orbits: Chronic inflammatory changes of the sphenoid sinus. Other paranasal sinuses are clear. Mastoid air cells are clear. Orbits negative. Other: None IMPRESSION: 1. Low-density in the right para median pons consistent with stroke. This is of indeterminate age but was not visible in February of this year and therefore could be recent. 2. Atrophy and chronic small-vessel ischemic changes of the white matter. 3. Chronic inflammatory changes of the sphenoid sinus. Electronically Signed   By: Nelson Chimes M.D.   On: 05/27/2022 14:03   DG Chest Portable 1 View  Result Date: 05/27/2022 CLINICAL DATA:  Hypoxia EXAM:  PORTABLE CHEST 1 VIEW COMPARISON:  01/19/2016 FINDINGS: Artifact overlies the chest. Mild cardiomegaly. Mild aortic tortuosity. The lungs are clear. No edema, infiltrate, collapse or effusion. IMPRESSION: No active disease. Mild cardiomegaly and aortic tortuosity. Electronically Signed   By: Nelson Chimes M.D.   On: 05/27/2022 12:58    EKG: Independently reviewed.  Sinus rhythm at 92 bpm.  Nonspecific T wave flattening.  Low voltage multiple leads.  Assessment/Plan Principal Problem:   Acute CVA (cerebrovascular accident) [facility]) Active Problems:   Essential hypertension   Enlarged prostate   Depression with anxiety   Bladder outflow obstruction   UTI (urinary tract infection)   Indwelling Foley catheter present   Acute CVA Encephalopathy > Patient presenting with weakness and encephalopathy.  This is per neighbor who helps him out.  Was normal last night but found to be weak and confused today. > Mentation has improved somewhat since EMS found him.  Confounding factor is he was intoxicated on arrival with ethanol level of 214.  Also noted to have evidence of UTI as below. > Both CT head and MR brain indicated acute infarct of right pons. > Neurology consulted and are following. - Neurology consult - Allow for permissive HTN (systolic < 314 and diastolic < 970)  -  Daily aspirin and Plavix - Continue home statin - Echocardiogram  - Carotid doppler, defer to neurology if they would like CTA or MRA - A1C  - Lipid panel  - Tele monitoring  - SLP eval - PT/OT  UTI Foley catheter in place Traumatic hypospadias BPH with outflow obstruction > Patient with evidence of UTI on urinalysis with hemoglobin, nitrates, leukocytes, bacteria.  Urine cultures pending.  Started on cefepime in the ED. > Also noted to have evidence of traumatic hypospadias, urology consulted and said there is nothing to do.  Is to follow-up with urology as scheduled early in January but they can be reconsulted as  needed.  Catheter last exchanged 1.5 weeks ago. - De-escalate to ceftriaxone - Follow-up urine culture - Trend fever curve and WBC - Continue with Foley catheter - Continue home finasteride and tamsulosin  Depression Anxiety - Continue home sertraline  Hypertension - Holding home amlodipine and losartan in the setting of permissive hypertension as above  Alcohol use > Intoxicated on arrival as above.  Ethanol level 214.  States he does not drink every day and does not have history of withdrawal. - Received a dose of thiamine in the ED - CIWA without Ativan - Continue Vitamins  DVT prophylaxis: Lovenox Code Status:   Full Family Communication:  None on admission  Disposition Plan:   Patient is from:  Home  Anticipated DC to:  Home  Anticipated DC date:  1 to 3 days  Anticipated DC barriers: None  Consults called:  Neurology, urology (urology signed off) Admission status:  Observation, telemetry  Severity of Illness: The appropriate patient status for this patient is OBSERVATION. Observation status is judged to be reasonable and necessary in order to provide the required intensity of service to ensure the patient's safety. The patient's presenting symptoms, physical exam findings, and initial radiographic and laboratory data in the context of their medical condition is felt to place them at decreased risk for further clinical deterioration. Furthermore, it is anticipated that the patient will be medically stable for discharge from the hospital within 2 midnights of admission.    Marcelyn Bruins MD [facility]  How to contact the [facility] Attending or Consulting provider [place] or covering provider during after hours [place], for this patient?   Check the care team in [facility] and look for a) attending/consulting [facility] provider listed and b) the [facility] team listed Log into [email or web address] and use [place]'s universal password to access. If you do not have the password, please  contact the hospital operator. Locate the [facility] provider you are looking for under [facility] and page to a number that you can be directly reached. If you still have difficulty reaching the provider, please page the [facility] (Director on Call) for the Hospitalists listed on amion for assistance.  05/27/2022, 6:33 PM

## 2022-05-28 ENCOUNTER — Observation Stay (HOSPITAL_COMMUNITY): Payer: Medicare HMO

## 2022-05-28 DIAGNOSIS — R69 Illness, unspecified: Secondary | ICD-10-CM | POA: Diagnosis not present

## 2022-05-28 DIAGNOSIS — F10920 Alcohol use, unspecified with intoxication, uncomplicated: Secondary | ICD-10-CM | POA: Diagnosis not present

## 2022-05-28 DIAGNOSIS — I6389 Other cerebral infarction: Secondary | ICD-10-CM

## 2022-05-28 DIAGNOSIS — I6350 Cerebral infarction due to unspecified occlusion or stenosis of unspecified cerebral artery: Secondary | ICD-10-CM

## 2022-05-28 DIAGNOSIS — M79641 Pain in right hand: Secondary | ICD-10-CM | POA: Diagnosis not present

## 2022-05-28 DIAGNOSIS — I672 Cerebral atherosclerosis: Secondary | ICD-10-CM | POA: Diagnosis not present

## 2022-05-28 DIAGNOSIS — I639 Cerebral infarction, unspecified: Secondary | ICD-10-CM | POA: Diagnosis not present

## 2022-05-28 LAB — LIPID PANEL
Cholesterol: 173 mg/dL (ref 0–200)
HDL: 59 mg/dL (ref >40)
LDL Cholesterol: 92 mg/dL (ref 0–99)
Total CHOL/HDL Ratio: 2.9 RATIO
Triglycerides: 111 mg/dL (ref <150)
VLDL: 22 mg/dL (ref 0–40)

## 2022-05-28 LAB — CBC
HCT: 40.9 % (ref 39.0–52.0)
Hemoglobin: 14.4 g/dL (ref 13.0–17.0)
MCH: 32.6 pg (ref 26.0–34.0)
MCHC: 35.2 g/dL (ref 30.0–36.0)
MCV: 92.5 fL (ref 80.0–100.0)
Platelets: 279 10*3/uL (ref 150–400)
RBC: 4.42 MIL/uL (ref 4.22–5.81)
RDW: 14.1 % (ref 11.5–15.5)
WBC: 10.3 10*3/uL (ref 4.0–10.5)
nRBC: 0 % (ref 0.0–0.2)

## 2022-05-28 LAB — ECHOCARDIOGRAM COMPLETE: S' Lateral: 2.9 cm

## 2022-05-28 LAB — HEMOGLOBIN A1C
Hgb A1c MFr Bld: 5.5 % (ref 4.8–5.6)
Mean Plasma Glucose: 111 mg/dL

## 2022-05-28 MED ORDER — THIAMINE MONONITRATE 100 MG PO TABS
100.0000 mg | ORAL_TABLET | Freq: Every day | ORAL | Status: DC
  Administered 2022-05-28 – 2022-06-04 (×8): 100 mg via ORAL
  Filled 2022-05-28 (×8): qty 1

## 2022-05-28 MED ORDER — HYDRALAZINE HCL 25 MG PO TABS
25.0000 mg | ORAL_TABLET | Freq: Three times a day (TID) | ORAL | Status: DC | PRN
  Administered 2022-06-01: 25 mg via ORAL
  Filled 2022-05-28 (×2): qty 1

## 2022-05-28 MED ORDER — ATORVASTATIN CALCIUM 40 MG PO TABS
40.0000 mg | ORAL_TABLET | Freq: Every day | ORAL | Status: DC
  Administered 2022-05-28 – 2022-06-04 (×8): 40 mg via ORAL
  Filled 2022-05-28 (×8): qty 1

## 2022-05-28 MED ORDER — CHLORHEXIDINE GLUCONATE CLOTH 2 % EX PADS
6.0000 | MEDICATED_PAD | Freq: Every day | CUTANEOUS | Status: DC
  Administered 2022-05-28 – 2022-06-04 (×8): 6 via TOPICAL

## 2022-05-28 MED ORDER — LORAZEPAM 1 MG PO TABS
1.0000 mg | ORAL_TABLET | ORAL | Status: AC | PRN
  Administered 2022-05-28: 4 mg via ORAL
  Administered 2022-05-28: 3 mg via ORAL
  Filled 2022-05-28: qty 3
  Filled 2022-05-28: qty 4

## 2022-05-28 MED ORDER — FOLIC ACID 1 MG PO TABS
1.0000 mg | ORAL_TABLET | Freq: Every day | ORAL | Status: DC
  Administered 2022-05-28 – 2022-06-04 (×8): 1 mg via ORAL
  Filled 2022-05-28 (×8): qty 1

## 2022-05-28 MED ORDER — THIAMINE HCL 100 MG/ML IJ SOLN
100.0000 mg | Freq: Every day | INTRAMUSCULAR | Status: DC
  Filled 2022-05-28: qty 2

## 2022-05-28 MED ORDER — IOHEXOL 350 MG/ML SOLN
75.0000 mL | Freq: Once | INTRAVENOUS | Status: AC | PRN
  Administered 2022-05-28: 75 mL via INTRAVENOUS

## 2022-05-28 MED ORDER — LORAZEPAM 2 MG/ML IJ SOLN
1.0000 mg | INTRAMUSCULAR | Status: AC | PRN
  Administered 2022-05-28 – 2022-05-29 (×3): 2 mg via INTRAVENOUS
  Filled 2022-05-28 (×3): qty 1

## 2022-05-28 MED ORDER — ADULT MULTIVITAMIN W/MINERALS CH
1.0000 | ORAL_TABLET | Freq: Every day | ORAL | Status: DC
  Administered 2022-05-28 – 2022-06-04 (×8): 1 via ORAL
  Filled 2022-05-28 (×8): qty 1

## 2022-05-28 NOTE — Progress Notes (Signed)
PROGRESS NOTE    Calvin DEMONTE  [name]:[id number] DOB: 10-05-1945 DOA: 05/27/2022 PCP: Laurey Morale, MD    Brief Narrative:   Calvin [name] is a 76 y.o. male with past medical history significant for chronic bladder outlet obstruction/BPH with chronic Foley catheter, history of recurrent UTI, nephrolithiasis, HTN, anxiety/depression, EtOH use disorder, left lower extremity stress fracture currently utilizing a cam walker boot who presented via EMS to [facility] ED on 12/10 with confusion, weakness.  Patient lives alone but has a neighbor who has been helping him out more frequently.  Last known normal was night prior to admission.  On EMS arrival, patient was found sitting on the floor near his wheelchair with suspicion that he slid out of the wheelchair with unknown downtime.  He was minimally responsive to first and there was initial concern for hypotension and hypoxia.  Upon EMS standing him up his symptoms somewhat improved and he was transported to the ED for further evaluation.  Patient denied fever, no chills, no chest pain, no abdominal pain, no nausea/vomiting/diarrhea.  In the ED, temperature 97.8 F, HR 90, RR 16, BP 113/76, SpO2 96% on room air.  Sodium 141, potassium 3.4, chloride 106, CO2 23, glucose 107, BUN 9, creatinine 0.79.  Magnesium 1.9, AST 23, ALT 19, total bilirubin 0.6.  Lactic acid 2.1.  WBC 8.3, hemoglobin 13.9, platelets 283.  INR 1.1.  Urinalysis with large leukocytes, positive nitrite, few bacteria, 21-50 WBCs.  UDS negative.  CT head without contrast with low-density right paramedian pons consistent with CVA, atrophy and chronic small vessel ischemic changes of the white matter.  CT angiogram head/neck with no emergent finding, generalized atherosclerosis most notably causing 60% narrowing at the origin of the nondominant left vertebral artery.  MR brain without contrast with acute infarction right paramedial pons, no swelling or hemorrhage, generalized atrophy and moderate  chronic small vessel ischemic changes of the cerebral hemispheric white matter.  Neurology was consulted.  Hospitalist service consulted for evaluation management of acute CVA and UTI.  Assessment & Plan:   Acute ischemic CVA Presenting to the ED via EMS after being found down in his home by his neighbor on the floor with unknown downtime.  Last known normal was day prior to admission.  MR brain with acute infarction right paramedial pons.  CT angiogram head/neck with no emergent finding.  LDL 92. -- Neurology following, appreciate assistance -- Hemoglobin A1c: Pending -- TTE: Pending -- Atorvastatin 40 mg p.o. daily -- Aspirin 81 mg p.o. daily, Plavix 75 mg p.o. daily -- Pending PT/OT/SLP evaluation -- Continue to trend telemetry  Urinary tract infection Complicated by this requirement of chronic Foley catheter use.  Follows with urology outpatient.  Review of previous urine cultures all notable for E. coli and only resistance to ampicillin. -- Blood cultures x 2: Pending -- Urine culture: Pending -- Ceftriaxone 1 g IV every 24 hours  Hypokalemia Potassium slightly low 3.4 on admission, repleted. -- Repeat BMP in a.m.  History of bladder outlet obstruction/BPH with chronic Foley catheter --Continue Foley catheter, exchanged 1.5 weeks prior -- Tamsulosin 0.4 mg p.o. daily -- Finasteride 5 mg p.o. daily -- Continue outpatient follow-up with urology  Essential hypertension:  Home medication include Norvasc 5 mg p.o. daily, losartan '100mg'$  p.o. daily. -- Holding antihypertensives to allow permissive hypertension in the first 24hours following acute CVA up to 220/120. --continue to monitor BP closely  Anxiety/depression: -- Zoloft 100 mg p.o. daily  Left lower extremity stress fracture Patient  follows with orthopedics outpatient, currently utilizing a cam walker boot for ambulation. --Weightbearing as tolerates left lower extremity --PT/OT evaluation -- Continue outpatient  follow-up with orthopedics  EtOH use disorder/intoxication EtOH level elevated to 14 on admission.  Counseled on need for complete cessation --CIWA protocol   DVT prophylaxis: enoxaparin (LOVENOX) injection 40 mg Start: 05/27/22 2200    Code Status: Full Code Family Communication:   Disposition Plan:  Level of care: Telemetry Medical Status is: Observation The patient remains OBS appropriate and will d/c before 2 midnights.    Consultants:  Neurology  Procedures:  TTE: Pending  Antimicrobials:  None   Subjective: Patient seen examined bedside, resting comfortably.  No specific complaints this morning.  Wanting something to eat.  Feels that he has no "deficits".  Discussed findings of imaging with patient and further workup initiated.  Also concerned about possible urinary tract infection.  Discussed need for complete alcohol cessation.  No other specific complaints or concerns at this time.  Denies headache, no dizziness, no chest pain, no palpitations, no shortness of breath, no abdominal pain, no fever/chills/night sweats, no nausea/vomiting/diarrhea, no focal weakness, no fatigue, no paresthesias.  No acute events overnight per nursing staff.  Objective: Vitals:   05/28/22 0110 05/28/22 0400 05/28/22 0511 05/28/22 0852  BP: (!) 163/112 (!) 160/96 (!) 158/98 (!) 143/103  Pulse: (!) 102 (!) 104 99 96  Resp: '20 16 18 17  '$ Temp:   97.7 F (36.5 C) 97.9 F (36.6 C)  TempSrc:   Oral Oral  SpO2: 96% 95% 95% 97%    Intake/Output Summary (Last 24 hours) at 05/28/2022 1225 Last data filed at 05/28/2022 0059 Gross per 24 hour  Intake 94.88 ml  Output 1175 ml  Net -1080.12 ml   There were no vitals filed for this visit.  Examination:  Physical Exam: GEN: NAD, alert and oriented x 3, chronically ill in appearance, appears older than stated age 35: NCAT, PERRL, EOMI, sclera clear, MMM PULM: CTAB w/o wheezes/crackles, normal respiratory effort, on room air CV: RRR w/o  M/G/R GI: abd soft, NTND, NABS, no R/G/M MSK: no peripheral edema, muscle strength globally intact 5/5 bilateral upper/lower extremities NEURO: CN II-XII intact, no focal deficits, sensation to light touch intact PSYCH: normal mood/affect Integumentary: dry/intact, no rashes or wounds    Data Reviewed: I have personally reviewed following labs and imaging studies  CBC: Recent Labs  Lab 05/27/22 1215 05/27/22 1222 05/28/22 0250  WBC 8.3  --  10.3  NEUTROABS 5.4  --   --   HGB 13.9 13.6 14.4  HCT 40.5 40.0 40.9  MCV 94.4  --  92.5  PLT 283  --  675   Basic Metabolic Panel: Recent Labs  Lab 05/27/22 1215 05/27/22 1222  NA 141 143  K 3.4* 3.5  CL 106 105  CO2 23  --   GLUCOSE 107* 105*  BUN 9 9  CREATININE 0.79 1.00  CALCIUM 9.0  --   MG 1.9  --    GFR: CrCl cannot be calculated (Unknown ideal weight.). Liver Function Tests: Recent Labs  Lab 05/27/22 1215  AST 23  ALT 19  ALKPHOS 88  BILITOT 0.6  PROT 7.1  ALBUMIN 3.6   No results for input(s): "LIPASE", "AMYLASE" in the last 168 hours. No results for input(s): "AMMONIA" in the last 168 hours. Coagulation Profile: Recent Labs  Lab 05/27/22 1215  INR 1.1   Cardiac Enzymes: Recent Labs  Lab 05/27/22 1215  CKTOTAL 49  BNP (last 3 results) No results for input(s): "PROBNP" in the last 8760 hours. HbA1C: No results for input(s): "HGBA1C" in the last 72 hours. CBG: No results for input(s): "GLUCAP" in the last 168 hours. Lipid Profile: Recent Labs    05/28/22 0250  CHOL 173  HDL 59  LDLCALC 92  TRIG 111  CHOLHDL 2.9   Thyroid Function Tests: No results for input(s): "TSH", "T4TOTAL", "FREET4", "T3FREE", "THYROIDAB" in the last 72 hours. Anemia Panel: No results for input(s): "VITAMINB12", "FOLATE", "FERRITIN", "TIBC", "IRON", "RETICCTPCT" in the last 72 hours. Sepsis Labs: Recent Labs  Lab 05/27/22 1215 05/27/22 1535  LATICACIDVEN 2.1* 2.1*    Recent Results (from the past 240  hour(s))  Blood culture (routine x 2)     Status: None (Preliminary result)   Collection Time: 05/27/22 12:15 PM   Specimen: BLOOD  Result Value Ref Range Status   Specimen Description BLOOD SITE NOT SPECIFIED  Final   Special Requests   Final    BOTTLES DRAWN AEROBIC AND ANAEROBIC Blood Culture adequate volume   Culture   Final    NO GROWTH < 24 HOURS Performed at [facility], [place] [address] [place], [place] [address]    Report Status PENDING  Incomplete  Blood culture (routine x 2)     Status: None (Preliminary result)   Collection Time: 05/27/22 12:15 PM   Specimen: BLOOD  Result Value Ref Range Status   Specimen Description BLOOD SITE NOT SPECIFIED  Final   Special Requests   Final    BOTTLES DRAWN AEROBIC AND ANAEROBIC Blood Culture adequate volume   Culture   Final    NO GROWTH < 24 HOURS Performed at [facility], [place] [address], [place], [place] [address]    Report Status PENDING  Incomplete         Radiology Studies: CT ANGIO HEAD NECK W WO CM  Result Date: 05/28/2022 CLINICAL DATA:  Stroke workup EXAM: CT ANGIOGRAPHY HEAD AND NECK TECHNIQUE: Multidetector CT imaging of the head and neck was performed using the standard protocol during bolus administration of intravenous contrast. Multiplanar CT image reconstructions and MIPs were obtained to evaluate the vascular anatomy. Carotid stenosis measurements (when applicable) are obtained utilizing NASCET criteria, using the distal internal carotid diameter as the denominator. RADIATION DOSE REDUCTION: This exam was performed according to the departmental dose-optimization program which includes automated exposure control, adjustment of the mA and/or kV according to patient size and/or use of iterative reconstruction technique. CONTRAST:  86m OMNIPAQUE IOHEXOL 350 MG/ML SOLN COMPARISON:  Brain MRI from yesterday FINDINGS: CT HEAD FINDINGS Brain: Known right pontine stroke. Generalized brain atrophy with  ventriculomegaly. Mild chronic small vessel ischemia in the deep white matter. No hemorrhage, mass, or collection. Vascular: No acute finding Skull: No acute finding Sinuses/Orbits: Mucosal thickening in the left sphenoid sinus Review of the MIP images confirms the above findings CTA NECK FINDINGS Aortic arch: Mild atheromatous plaque Right carotid system: Atheromatous wall thickening of the common carotid with heavily calcified plaque at the bifurcation. No ulceration or significant stenosis Left carotid system: Mild calcified plaque at the bifurcation without stenosis or ulceration Vertebral arteries: No proximal subclavian stenosis. Dominant right vertebral artery. Left vertebral origin plaque causes 60% narrowing based on coronal reformats. The left vertebral artery ends in the PICA Skeleton: Ordinary cervical spine degeneration. Other neck: No acute finding Upper chest: Clear apical lungs Review of the MIP images confirms the above findings CTA HEAD FINDINGS Anterior circulation:  Atheromatous calcification of the carotid siphons. Intracranial branch atheromatous irregularity that is mild. No flow reducing stenosis. No branch occlusion, beading, or aneurysm Posterior circulation: Atheromatous calcification of the right vertebral artery and basilar without flow reducing stenosis. The left vertebral artery terminates in the left PICA. No branch occlusion, beading, or aneurysm Venous sinuses: Unremarkable for the arterial phase Anatomic variants: None significant Review of the MIP images confirms the above findings IMPRESSION: 1. No emergent finding. 2. Generalized atherosclerosis most notably causing 60% narrowing at the origin of the non dominant left vertebral artery. 3. No irregularity or significant stenosis at the level of the basilar. Electronically Signed   By: Jorje Guild M.D.   On: 05/28/2022 05:57   MR BRAIN WO CONTRAST  Result Date: 05/27/2022 CLINICAL DATA:  Neuro deficit, acute, stroke  suspected. Fell from wheelchair. Slurred speech and left-sided weakness. EXAM: MRI HEAD WITHOUT CONTRAST TECHNIQUE: Multiplanar, multiecho pulse sequences of the brain and surrounding structures were obtained without intravenous contrast. COMPARISON:  Head CT earlier same day FINDINGS: Brain: Diffusion imaging confirms the presence of an acute stroke in the right para median pons. No gross swelling or hemorrhage. No other acute infarction. No focal cerebellar insult. Cerebral hemispheres show generalized atrophy with low moderate chronic small-vessel ischemic changes of the white matter. No supra tentorial large vessel stroke. No mass, hemorrhage, hydrocephalus or extra-axial collection. Vascular: Major vessels at the base of the brain show flow. Skull and upper cervical spine: Negative Sinuses/Orbits: Inflammatory changes of the sphenoid sinus. The other sinuses are clear. Orbits negative. Other: None IMPRESSION: 1. Acute infarction in the right para median pons. No swelling or hemorrhage. 2. Generalized atrophy. Moderate chronic small-vessel ischemic changes of the cerebral hemispheric white matter. Electronically Signed   By: Nelson Chimes M.D.   On: 05/27/2022 17:16   CT HEAD WO CONTRAST  Result Date: 05/27/2022 CLINICAL DATA:  Multiple neuro deficit, acute, stroke suspected. Mental status change. EXAM: CT HEAD WITHOUT CONTRAST TECHNIQUE: Contiguous axial images were obtained from the base of the skull through the vertex without intravenous contrast. RADIATION DOSE REDUCTION: This exam was performed according to the departmental dose-optimization program which includes automated exposure control, adjustment of the mA and/or kV according to patient size and/or use of iterative reconstruction technique. COMPARISON:  MRI 07/29/2021 FINDINGS: Brain: Low-density in the right para median pons consistent with stroke. This is of indeterminate age but was not visible in February of this year. No focal cerebellar  insult. Cerebral hemispheres show atrophy, ex vacuo enlargement of the lateral ventricles, and chronic small vessel change of the white matter. No large vessel territory stroke is visible. No mass or extra-axial collection. No hemorrhage. Vascular: There is atherosclerotic calcification of the major vessels at the base of the brain. Skull: No skull fracture. Sinuses/Orbits: Chronic inflammatory changes of the sphenoid sinus. Other paranasal sinuses are clear. Mastoid air cells are clear. Orbits negative. Other: None IMPRESSION: 1. Low-density in the right para median pons consistent with stroke. This is of indeterminate age but was not visible in February of this year and therefore could be recent. 2. Atrophy and chronic small-vessel ischemic changes of the white matter. 3. Chronic inflammatory changes of the sphenoid sinus. Electronically Signed   By: Nelson Chimes M.D.   On: 05/27/2022 14:03   DG Chest Portable 1 View  Result Date: 05/27/2022 CLINICAL DATA:  Hypoxia EXAM: PORTABLE CHEST 1 VIEW COMPARISON:  01/19/2016 FINDINGS: Artifact overlies the chest. Mild cardiomegaly. Mild aortic tortuosity. The lungs are  clear. No edema, infiltrate, collapse or effusion. IMPRESSION: No active disease. Mild cardiomegaly and aortic tortuosity. Electronically Signed   By: Nelson Chimes M.D.   On: 05/27/2022 12:58        Scheduled Meds:  aspirin EC  81 mg Oral Daily   atorvastatin  40 mg Oral Daily   clopidogrel  75 mg Oral Daily   enoxaparin (LOVENOX) injection  40 mg Subcutaneous Q24H   finasteride  5 mg Oral Daily   sertraline  100 mg Oral Daily   tamsulosin  0.4 mg Oral Daily   Continuous Infusions:  cefTRIAXone (ROCEPHIN)  IV Stopped (05/28/22 0509)     LOS: 0 days    Time spent: 51 minutes spent on chart review, discussion with nursing staff, consultants, updating family and interview/physical exam; more than 50% of that time was spent in counseling and/or coordination of care.    Kynan Peasley J  [place] DO [facility] Available via Epic secure chat 7am-7pm After these hours, please refer to coverage provider listed on [email or web address] 05/28/2022, 12:25 PM

## 2022-05-28 NOTE — Evaluation (Signed)
Physical Therapy Evaluation Patient Details Name: Calvin [name] [name]: [id number] DOB: 02/26/1946 Today's Date: 05/28/2022  History of Present Illness  Pt is a 76 y/o male who presented after being found sitting on the floor near his wheelchair on 05/27/22. 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 EtOH elevated on arrival. Pt also admitted for treatment of UTI and hypokalemia. PMH: BPH w/ chronic foley catheter, HTN, etoh use, L lower extremity fx (WBAT w/ cam boot per hospitalist note)  Clinical Impression  Pt admitted with above diagnosis. Pt is questionable historian, particularly with timeline of events.  Seems that he was at home with assist from aide with limited ambulation.  He had recent hospitalization (10/23) and SNF stay; however, found no documentation/imaging in regards to the stress fx on the left that he reports.  Per hospitalist note would be WBAT in CAM.  Today, pt requiring min A to sit EOB and unable to progress OOB due to nausea, suspect would need assist of 2.  Bil LE strength appear equal and generally weak with tremors.  Pt currently with functional limitations due to the deficits listed below (see PT Problem List). Pt will benefit from skilled PT to increase their independence and safety with mobility to allow discharge to the venue listed below.          Recommendations for follow up therapy are one component of a multi-disciplinary discharge planning process, led by the attending physician.  Recommendations may be updated based on patient status, additional functional criteria and insurance authorization.  Follow Up Recommendations Skilled nursing-short term rehab (<3 hours/day) Can patient physically be transported by private vehicle: No    Assistance Recommended at Discharge Frequent or constant Supervision/Assistance  Patient can return home with the following  Two people to help with walking and/or transfers;Two people to help with  bathing/dressing/bathroom;Help with stairs or ramp for entrance;Assistance with cooking/housework    Equipment Recommendations None recommended by PT  Recommendations for Other Services       Functional Status Assessment Patient has had a recent decline in their functional status and demonstrates the ability to make significant improvements in function in a reasonable and predictable amount of time.     Precautions / Restrictions Precautions Precautions: Fall;Other (comment) Precaution Comments: watch HR, BP, DOE Required Braces or Orthoses: Other Brace Other Brace: LLE cam boot Restrictions Weight Bearing Restrictions: Yes LLE Weight Bearing: Weight bearing as tolerated      Mobility  Bed Mobility Overal bed mobility: Needs Assistance Bed Mobility: Supine to Sit, Sit to Supine     Supine to sit: Min assist, HOB elevated Sit to supine: Min assist   General bed mobility comments: light Min A to lift trunk, manuever LEs, use of bedrail    Transfers                   General transfer comment: deferred due to increasing nausea    Ambulation/Gait                  Stairs            Wheelchair Mobility    Modified Rankin (Stroke Patients Only) Modified Rankin (Stroke Patients Only) Pre-Morbid Rankin Score: Moderately severe disability Modified Rankin: Severe disability     Balance Overall balance assessment: Needs assistance Sitting-balance support: No upper extremity supported, Feet supported Sitting balance-Leahy Scale: Fair         Standing balance comment: deferred due to nausea  Pertinent Vitals/Pain Pain Assessment Pain Assessment: Faces Faces Pain Scale: Hurts little more Pain Location: lower abdomen Pain Descriptors / Indicators: Sore, Dull, Guarding, Grimacing Pain Intervention(s): Monitored during session, Limited activity within patient's tolerance    Home Living Family/patient  expects to be discharged to:: Private residence Living Arrangements: Alone Available Help at Discharge: Personal care attendant (later reports aide lives with him) Type of Home: House Home Access: Stairs to enter Entrance Stairs-Rails: Right Entrance Stairs-Number of Steps: 3, but spread out   Home Layout: One level;Laundry or work area in [place]: Kasandra Knudsen - single Barista (2 wheels);Grab bars - tub/shower;Shower seat;Wheelchair - manual Additional Comments: reports aide (who was also a former Industrial/product designer) can stay with him to assist    Prior Function Prior Level of Function : Needs assist;Patient poor historian/Family not available             Mobility Comments: reports initially using RW since DC from SNF but now using w/c more recently ADLs Comments: prior to Oct 2023, pt typically independent with ADLs though difficulty managing. Since DC from SNF, pt's friend/aide has been assisting with toilet transfers, bathing, IADLs.     Hand Dominance   Dominant Hand: Right    Extremity/Trunk Assessment   Upper Extremity Assessment Upper Extremity Assessment: Defer to OT evaluation;Generalized weakness (tremors)    Lower Extremity Assessment Lower Extremity Assessment: LLE deficits/detail;RLE deficits/detail RLE Deficits / Details: ROM WFL; MMT : ankle DF 5/5, hip flex 4/5, good quad set (unable to test at EOB due to nausea/fatigue); tremors LLE Deficits / Details: ROM WFL; MMT : ankle DF 5/5, hip flex 4/5, good quad set (unable to test at EOB due to nausea/fatigue); tremors    Cervical / Trunk Assessment Cervical / Trunk Assessment: Kyphotic  Communication   Communication: HOH  Cognition Arousal/Alertness: Awake/alert Behavior During Therapy: WFL for tasks assessed/performed, Restless Overall Cognitive Status: Impaired/Different from baseline Area of Impairment: Orientation, Attention, Memory, Following commands, Safety/judgement, Problem solving,  Awareness                 Orientation Level: Disoriented to, Situation Current Attention Level: Sustained Memory: Decreased short-term memory Following Commands: Follows one step commands with increased time, Follows one step commands consistently Safety/Judgement: Decreased awareness of safety, Decreased awareness of deficits Awareness: Intellectual, Emergent Problem Solving: Slow processing, Decreased initiation, Difficulty sequencing, Requires verbal cues, Requires tactile cues General Comments: contradictory info given throughout session (ex reported came to hospital for nausea but told MD it just started a few mins ago), follows one step directions well, answers majority of orientation questions; timeline of events given do not line up well (fx of L foot not documented in prior admissions, assist at home, etc)        General Comments General comments (skin integrity, edema, etc.): HR up to 131 bpm with activity with increasing WOB but with SpO2 96% on RA. BP elevated.    Exercises     Assessment/Plan    PT Assessment Patient needs continued PT services  PT Problem List Decreased strength;Decreased coordination;Cardiopulmonary status limiting activity;Decreased range of motion;Decreased cognition;Decreased activity tolerance;Decreased knowledge of use of DME;Decreased balance;Decreased safety awareness;Decreased mobility;Decreased knowledge of precautions       PT Treatment Interventions DME instruction;Therapeutic exercise;Gait training;Balance training;Neuromuscular re-education;Functional mobility training;Modalities;Therapeutic activities;Patient/family education;Cognitive remediation    PT Goals (Current goals can be found in the Care Plan section)  Acute Rehab PT Goals Patient Stated Goal: decrease nausea PT Goal Formulation: With patient Time For Goal  Achievement: 06/11/22 Potential to Achieve Goals: Fair    Frequency Min 3X/week     Co-evaluation PT/OT/SLP  Co-Evaluation/Treatment: Yes Reason for Co-Treatment: For patient/therapist safety PT goals addressed during session: Mobility/safety with mobility OT goals addressed during session: ADL's and self-care;Strengthening/ROM       AM-PAC PT "6 Clicks" Mobility  Outcome Measure Help needed turning from your back to your side while in a flat bed without using bedrails?: A Little Help needed moving from lying on your back to sitting on the side of a flat bed without using bedrails?: A Little Help needed moving to and from a bed to a chair (including a wheelchair)?: Total Help needed standing up from a chair using your arms (e.g., wheelchair or bedside chair)?: Total Help needed to walk in hospital room?: Total Help needed climbing 3-5 steps with a railing? : Total 6 Click Score: 10    End of Session Equipment Utilized During Treatment:  (Has CAM boot in home) Activity Tolerance: Patient tolerated treatment well Patient left: in bed;with call bell/phone within reach;with bed alarm set Nurse Communication: Mobility status PT Visit Diagnosis: Other abnormalities of gait and mobility (R26.89);Muscle weakness (generalized) (M62.81)    Time: 4037-5436 PT Time Calculation (min) (ACUTE ONLY): 23 min   Charges:   PT Evaluation $PT Eval Moderate Complexity: 1 Mod          Lyniah Fujita, PT Acute Rehab [facility] [phone]   Karlton Lemon 05/28/2022, 3:14 PM

## 2022-05-28 NOTE — Progress Notes (Signed)
Orthopedic Tech Progress Note Patient Details:  Calvin [name] 11/23/1945 [id number]  Applied CAM WALKER to the pt's LLE. Pt stated they have another CAM walker at home but nursing wanted them to have another one.   Ortho Devices Type of Ortho Device: CAM walker Ortho Device/Splint Location: LLE Ortho Device/Splint Interventions: Ordered, Application, Adjustment   Post Interventions Patient Tolerated: Well Instructions Provided: Adjustment of device, Care of device  Arville Go 05/28/2022, 8:24 AM

## 2022-05-28 NOTE — Telephone Encounter (Signed)
Please okay these orders  ?

## 2022-05-28 NOTE — Progress Notes (Signed)
  Echocardiogram 2D Echocardiogram has been performed.  Calvin [name] 05/28/2022, 11:45 AM

## 2022-05-28 NOTE — ED Notes (Signed)
Pt removed CAM boot when asked why patient stated " I can't walk so why the hell do I need it on". RN explained to patient why, patient " I don't want the damn thing on". Boot was put back together and placed on the counter.

## 2022-05-28 NOTE — Evaluation (Signed)
Occupational Therapy Evaluation Patient Details Name: Calvin [name] [name]: [id number] DOB: March 19, 1946 Today's Date: 05/28/2022   History of Present Illness Pt is a 76 y/o male who presented after being found sitting on the floor near his wheelchair. 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 EtOH elevated at 14 on arrival. Pt also admitted for treatment of UTI and hypokalemia. PMH: BPH w/ chronic foley catheter, HTN, etoh use, L lower extremity fx (WBAT w/ cam boot per hospitalist note)   Clinical Impression   PTA, pt lives alone though reports he has a friend/aide that has been staying to assist him. Pt reports using wheelchair for mobility, declining from recent RW use with increasing assist needed for ADLs. No family/friends present to confirm PLOF reports. Pt presents with deficits noted below with increased WOB and tremors w/ activity. Pt requires Min A for bed mobility, unable to progress OOB due to increasing nausea EOB. Pt requires Min A for UB ADL and up to Total A for LB ADLs due to deficits. Based on functional decline and high risk for falls, rec SNF rehab at DC prior to return home.      Recommendations for follow up therapy are one component of a multi-disciplinary discharge planning process, led by the attending physician.  Recommendations may be updated based on patient status, additional functional criteria and insurance authorization.   Follow Up Recommendations  Skilled nursing-short term rehab (<3 hours/day)     Assistance Recommended at Discharge Frequent or constant Supervision/Assistance  Patient can return home with the following A lot of help with walking and/or transfers;A lot of help with bathing/dressing/bathroom    Functional Status Assessment  Patient has had a recent decline in their functional status and demonstrates the ability to make significant improvements in function in a reasonable and predictable amount of time.  Equipment Recommendations   None recommended by OT    Recommendations for Other Services       Precautions / Restrictions Precautions Precautions: Fall;Other (comment) Precaution Comments: watch HR, BP, DOE Required Braces or Orthoses: Other Brace Other Brace: LLE cam boot Restrictions Weight Bearing Restrictions: Yes LLE Weight Bearing: Weight bearing as tolerated      Mobility Bed Mobility Overal bed mobility: Needs Assistance Bed Mobility: Supine to Sit, Sit to Supine     Supine to sit: Min assist, HOB elevated Sit to supine: Min assist   General bed mobility comments: light Min A to lift trunk, manuever LEs, use of bedrail    Transfers                   General transfer comment: deferred due to increasing nausea      Balance Overall balance assessment: Needs assistance Sitting-balance support: No upper extremity supported, Feet supported Sitting balance-Leahy Scale: Fair                                     ADL either performed or assessed with clinical judgement   ADL Overall ADL's : Needs assistance/impaired Eating/Feeding: Set up   Grooming: Set up;Sitting   Upper Body Bathing: Minimal assistance;Sitting   Lower Body Bathing: Maximal assistance;Bed level;Sitting/lateral leans   Upper Body Dressing : Minimal assistance;Sitting   Lower Body Dressing: Maximal assistance;Sitting/lateral leans;Sit to/from stand       Toileting- Water quality scientist and Hygiene: Total assistance;Bed level         General ADL Comments: Tremulous  with movement, increased pain/nausea, and impaired cognition increasing fall risk.     Vision Ability to See in Adequate Light: 0 Adequate Patient Visual Report: No change from baseline Vision Assessment?: No apparent visual deficits     Perception     Praxis      Pertinent Vitals/Pain Pain Assessment Pain Assessment: Faces Faces Pain Scale: Hurts little more Pain Location: lower abdomen Pain Descriptors / Indicators:  Sore, Dull, Guarding, Grimacing Pain Intervention(s): Monitored during session, Limited activity within patient's tolerance     Hand Dominance Right   Extremity/Trunk Assessment Upper Extremity Assessment Upper Extremity Assessment: Generalized weakness (tremulous)   Lower Extremity Assessment Lower Extremity Assessment: Defer to PT evaluation   Cervical / Trunk Assessment Cervical / Trunk Assessment: Normal   Communication Communication Communication: HOH   Cognition Arousal/Alertness: Awake/alert Behavior During Therapy: WFL for tasks assessed/performed, Restless Overall Cognitive Status: Impaired/Different from baseline Area of Impairment: Orientation, Attention, Memory, Following commands, Safety/judgement, Problem solving, Awareness                 Orientation Level: Disoriented to, Situation Current Attention Level: Sustained Memory: Decreased short-term memory Following Commands: Follows one step commands with increased time, Follows one step commands consistently Safety/Judgement: Decreased awareness of safety, Decreased awareness of deficits Awareness: Intellectual, Emergent Problem Solving: Slow processing, Decreased initiation, Difficulty sequencing, Requires verbal cues, Requires tactile cues General Comments: contradictory info given throughout session, follows one step directions well, answers majority of orientation questions well. timeline of events given do not line up well (fx of L foot not documented in prior admissions, assist at home, etc)     General Comments  Hr to 131bpm, increasing WOB though SpO2 25% on RA. systolic BP high    Exercises     Shoulder Instructions      Home Living Family/patient expects to be discharged to:: Private residence Living Arrangements: Alone Available Help at Discharge: Personal care attendant (later reports aide lives with him) Type of Home: House Home Access: Stairs to enter [employer] of Steps: 3,  but spread out Entrance Stairs-Rails: Right Home Layout: One level;Laundry or work area in basement     [employer] Shower/Tub: Teacher, early years/pre: Handicapped height Bathroom Accessibility: Yes   Home Equipment: [place] - single Barista (2 wheels);Grab bars - tub/shower;Shower seat;Wheelchair - manual   Additional Comments: reports aide (who was also a former Industrial/product designer) can stay with him to assist      Prior Functioning/Environment Prior Level of Function : Needs assist;Patient poor historian/Family not available             Mobility Comments: reports initially using RW since DC from SNF but now using w/c more recently ADLs Comments: prior to Oct 2023, pt typically independent with ADLs though difficulty managing. Since DC from SNF, pt's friend/aide has been assisting with toilet transfers, bathing, IADLs.        OT Problem List: Decreased strength;Impaired balance (sitting and/or standing);Decreased activity tolerance;Decreased cognition;Decreased safety awareness;Pain;Cardiopulmonary status limiting activity      OT Treatment/Interventions: Self-care/ADL training;Therapeutic exercise;Energy conservation;DME and/or AE instruction;Therapeutic activities    OT Goals(Current goals can be found in the care plan section) Acute Rehab OT Goals Patient Stated Goal: resolve nausea OT Goal Formulation: With patient Time For Goal Achievement: 06/11/22 Potential to Achieve Goals: Fair  OT Frequency: Min 2X/week    Co-evaluation PT/OT/SLP Co-Evaluation/Treatment: Yes Reason for Co-Treatment: For patient/therapist safety;To address functional/ADL transfers   OT goals addressed during session: ADL's and  self-care;Strengthening/ROM      AM-PAC OT "6 Clicks" Daily Activity     Outcome Measure Help from another person eating meals?: None Help from another person taking care of personal grooming?: A Little Help from another person toileting, which includes  using toliet, bedpan, or urinal?: Total Help from another person bathing (including washing, rinsing, drying)?: A Lot Help from another person to put on and taking off regular upper body clothing?: A Little Help from another person to put on and taking off regular lower body clothing?: A Lot 6 Click Score: 15   End of Session Nurse Communication: Mobility status  Activity Tolerance: Other (comment) (limited by nausea) Patient left: in bed;with call bell/phone within reach;with bed alarm set  OT Visit Diagnosis: Other abnormalities of gait and mobility (R26.89);Unsteadiness on feet (R26.81);Muscle weakness (generalized) (M62.81)                Time: 1224-4975 OT Time Calculation (min): 23 min Charges:  OT General Charges $OT Visit: 1 Visit OT Evaluation $OT Eval Moderate Complexity: 1 Mod  Malachy Chamber, OTR/L Acute Rehab Services Office: [phone]   Layla Maw 05/28/2022, 2:37 PM

## 2022-05-28 NOTE — Progress Notes (Addendum)
STROKE TEAM PROGRESS NOTE   INTERVAL HISTORY Patient was evaluated at bedside. He expresses moderate discomfort with shortness of breath, suprapubic fullness and tenderness, nausea, and fecal urgency with diarrhea x2 while being evaluated. Neuro exam notable only for mild L sided LE weakness.  Vitals:   05/28/22 0852 05/28/22 1000 05/28/22 1200 05/28/22 1415  BP: (!) 143/103 (!) 163/132  (!) 149/95  Pulse: 96 (!) 114 (!) 112 (!) 144  Resp: 17 (!) 25 (!) 22 17  Temp: 97.9 F (36.6 C)     TempSrc: Oral     SpO2: 97% 94% 96% 93%   CBC:  Recent Labs  Lab 05/27/22 1215 05/27/22 1222 05/28/22 0250  WBC 8.3  --  10.3  NEUTROABS 5.4  --   --   HGB 13.9 13.6 14.4  HCT 40.5 40.0 40.9  MCV 94.4  --  92.5  PLT 283  --  597   Basic Metabolic Panel:  Recent Labs  Lab 05/27/22 1215 05/27/22 1222  NA 141 143  K 3.4* 3.5  CL 106 105  CO2 23  --   GLUCOSE 107* 105*  BUN 9 9  CREATININE 0.79 1.00  CALCIUM 9.0  --   MG 1.9  --    Lipid Panel:  Recent Labs  Lab 05/28/22 0250  CHOL 173  TRIG 111  HDL 59  CHOLHDL 2.9  VLDL 22  LDLCALC 92   HgbA1c: No results for input(s): "HGBA1C" in the last 168 hours. Urine Drug Screen:  Recent Labs  Lab 05/27/22 1544  LABOPIA NONE DETECTED  COCAINSCRNUR NONE DETECTED  LABBENZ NONE DETECTED  AMPHETMU NONE DETECTED  THCU NONE DETECTED  LABBARB NONE DETECTED    Alcohol Level  Recent Labs  Lab 05/27/22 1215  ETH 214*    IMAGING past 24 hours ECHOCARDIOGRAM COMPLETE  Result Date: 05/28/2022    ECHOCARDIOGRAM REPORT   Patient Name:   Calvin [name] Date of Exam: 05/28/2022 Medical Rec #:  [id number]      Height:       72.0 in Accession #:    [id number]     Weight:       220.0 lb Date of Birth:  [birth year]      BSA:          2.219 m Patient Age:    76 years       BP:           143/103 mmHg Patient Gender: M              HR:           107 bpm. Exam Location:  Inpatient Procedure: 2D Echo Indications:    stroke  History:        Patient  has prior history of Echocardiogram examinations, most                 recent 07/20/2021. Risk Factors:Hypertension.  Sonographer:    Johny Chess RDCS Referring Phys: [id number] Candace Gallus MELVIN  Sonographer Comments: Technically difficult study due to poor echo windows. Image acquisition challenging due to respiratory motion. IMPRESSIONS  1. Left ventricular ejection fraction, by estimation, is 60 to 65%. The left ventricle has normal function. The left ventricle has no regional wall motion abnormalities. There is mild concentric left ventricular hypertrophy. Left ventricular diastolic function could not be evaluated.  2. Right ventricular systolic function is normal. The right ventricular size is normal.  3. The mitral valve is grossly normal. No evidence  of mitral valve regurgitation. No evidence of mitral stenosis.  4. The aortic valve was not well visualized. Aortic valve regurgitation is not visualized. No aortic stenosis is present.  5. Aortic dilatation noted. There is mild dilatation of the ascending aorta, measuring 41 mm.  6. The inferior vena cava is normal in size with greater than 50% respiratory variability, suggesting right atrial pressure of 3 mmHg. Comparison(s): No significant change from prior study. Conclusion(s)/Recommendation(s): No intracardiac source of embolism detected on this transthoracic study. Consider a transesophageal echocardiogram to exclude cardiac source of embolism if clinically indicated. Cannot exclude atrial fibrillation based on  tracings--recommend confirming on telemetry/ECG. FINDINGS  Left Ventricle: Left ventricular ejection fraction, by estimation, is 60 to 65%. The left ventricle has normal function. The left ventricle has no regional wall motion abnormalities. The left ventricular internal cavity size was normal in size. There is  mild concentric left ventricular hypertrophy. Left ventricular diastolic function could not be evaluated. Right Ventricle: The right  ventricular size is normal. No increase in right ventricular wall thickness. Right ventricular systolic function is normal. Left Atrium: Left atrial size was normal in size. Right Atrium: Right atrial size was normal in size. Pericardium: There is no evidence of pericardial effusion. Mitral Valve: The mitral valve is grossly normal. No evidence of mitral valve regurgitation. No evidence of mitral valve stenosis. Tricuspid Valve: The tricuspid valve is grossly normal. Tricuspid valve regurgitation is not demonstrated. No evidence of tricuspid stenosis. Aortic Valve: The aortic valve was not well visualized. Aortic valve regurgitation is not visualized. No aortic stenosis is present. Pulmonic Valve: The pulmonic valve was not well visualized. Pulmonic valve regurgitation is not visualized. Aorta: Aortic dilatation noted. There is mild dilatation of the ascending aorta, measuring 41 mm. Venous: The inferior vena cava is normal in size with greater than 50% respiratory variability, suggesting right atrial pressure of 3 mmHg. IAS/Shunts: The interatrial septum was not well visualized. Additional Comments: There is a small pleural effusion in the left lateral region.  LEFT VENTRICLE PLAX 2D LVIDd:         4.40 cm LVIDs:         2.90 cm LV PW:         1.20 cm LV IVS:        1.30 cm LVOT diam:     2.30 cm LV SV:         66 LV SV Index:   30 LVOT Area:     4.15 cm  IVC IVC diam: 1.80 cm LEFT ATRIUM             Index        RIGHT ATRIUM           Index LA diam:        3.90 cm 1.76 cm/m   RA Area:     14.50 cm LA Vol (A2C):   53.8 ml 24.25 ml/m  RA Volume:   29.70 ml  13.39 ml/m LA Vol (A4C):   42.7 ml 19.25 ml/m LA Biplane Vol: 47.9 ml 21.59 ml/m  AORTIC VALVE LVOT Vmax:   77.10 cm/s LVOT Vmean:  51.400 cm/s LVOT VTI:    0.160 m  AORTA Ao Root diam: 3.50 cm Ao Asc diam:  4.10 cm  SHUNTS Systemic VTI:  0.16 m Systemic Diam: 2.30 cm Calvin Dresser MD Electronically signed by Calvin Dresser MD Signature  Date/Time: 05/28/2022/2:13:05 PM    Final    CT ANGIO HEAD NECK W WO CM  Result Date: 05/28/2022 CLINICAL DATA:  Stroke workup EXAM: CT ANGIOGRAPHY HEAD AND NECK TECHNIQUE: Multidetector CT imaging of the head and neck was performed using the standard protocol during bolus administration of intravenous contrast. Multiplanar CT image reconstructions and MIPs were obtained to evaluate the vascular anatomy. Carotid stenosis measurements (when applicable) are obtained utilizing NASCET criteria, using the distal internal carotid diameter as the denominator. RADIATION DOSE REDUCTION: This exam was performed according to the departmental dose-optimization program which includes automated exposure control, adjustment of the mA and/or kV according to patient size and/or use of iterative reconstruction technique. CONTRAST:  49m OMNIPAQUE IOHEXOL 350 MG/ML SOLN COMPARISON:  Brain MRI from yesterday FINDINGS: CT HEAD FINDINGS Brain: Known right pontine stroke. Generalized brain atrophy with ventriculomegaly. Mild chronic small vessel ischemia in the deep white matter. No hemorrhage, mass, or collection. Vascular: No acute finding Skull: No acute finding Sinuses/Orbits: Mucosal thickening in the left sphenoid sinus Review of the MIP images confirms the above findings CTA NECK FINDINGS Aortic arch: Mild atheromatous plaque Right carotid system: Atheromatous wall thickening of the common carotid with heavily calcified plaque at the bifurcation. No ulceration or significant stenosis Left carotid system: Mild calcified plaque at the bifurcation without stenosis or ulceration Vertebral arteries: No proximal subclavian stenosis. Dominant right vertebral artery. Left vertebral origin plaque causes 60% narrowing based on coronal reformats. The left vertebral artery ends in the PICA Skeleton: Ordinary cervical spine degeneration. Other neck: No acute finding Upper chest: Clear apical lungs Review of the MIP images confirms the  above findings CTA HEAD FINDINGS Anterior circulation: Atheromatous calcification of the carotid siphons. Intracranial branch atheromatous irregularity that is mild. No flow reducing stenosis. No branch occlusion, beading, or aneurysm Posterior circulation: Atheromatous calcification of the right vertebral artery and basilar without flow reducing stenosis. The left vertebral artery terminates in the left PICA. No branch occlusion, beading, or aneurysm Venous sinuses: Unremarkable for the arterial phase Anatomic variants: None significant Review of the MIP images confirms the above findings IMPRESSION: 1. No emergent finding. 2. Generalized atherosclerosis most notably causing 60% narrowing at the origin of the non dominant left vertebral artery. 3. No irregularity or significant stenosis at the level of the basilar. Electronically Signed   By: JJorje GuildM.D.   On: 05/28/2022 05:57   MR BRAIN WO CONTRAST  Result Date: 05/27/2022 CLINICAL DATA:  Neuro deficit, acute, stroke suspected. Fell from wheelchair. Slurred speech and left-sided weakness. EXAM: MRI HEAD WITHOUT CONTRAST TECHNIQUE: Multiplanar, multiecho pulse sequences of the brain and surrounding structures were obtained without intravenous contrast. COMPARISON:  Head CT earlier same day FINDINGS: Brain: Diffusion imaging confirms the presence of an acute stroke in the right para median pons. No gross swelling or hemorrhage. No other acute infarction. No focal cerebellar insult. Cerebral hemispheres show generalized atrophy with low moderate chronic small-vessel ischemic changes of the white matter. No supra tentorial large vessel stroke. No mass, hemorrhage, hydrocephalus or extra-axial collection. Vascular: Major vessels at the base of the brain show flow. Skull and upper cervical spine: Negative Sinuses/Orbits: Inflammatory changes of the sphenoid sinus. The other sinuses are clear. Orbits negative. Other: None IMPRESSION: 1. Acute infarction in  the right para median pons. No swelling or hemorrhage. 2. Generalized atrophy. Moderate chronic small-vessel ischemic changes of the cerebral hemispheric white matter. Electronically Signed   By: MNelson ChimesM.D.   On: 05/27/2022 17:16    PHYSICAL EXAM General: well-appearing and in moderate distress HEENT: normocephalic and atraumatic Cardiovascular: tachycardic Respiratory:  on RA and tachypneic on exam Gastrointestinal:  suprapubic tenderness and fullness Extremities: moving all extremities spontaneously  Mental Status: Calvin [name] is alert; he is oriented to person, place, time, and situation. Speech was clear and fluent without evidence of aphasia. He was able to follow 3 step commands without difficulty.  Cranial Nerves: II:  Visual fields grossly normal III,IV, VI: no ptosis, extra-ocular motions intact bilaterally V,VII: smile symmetric, facial light touch sensation intact bilaterally VIII: hearing is grossly impaired IX,X: uvula rises symmetrically XI: shoulder symmetrically elevate bilaterally XII: midline tongue extension without atrophy and without fasciculations  Motor: Right : Upper extremity   5/5 full power  Lower extremity   5/5 full power  Left: Upper extremity   5/5 full power Lower extremity   4/5 full range of motion against gravity and offers some resistance  Tone and bulk: normal tone throughout; no atrophy noted  Sensory: sensation to light touch intact throughout bilaterally  Cerebellar: Finger-to-nose test normal, heel-to-shin test assessed but equivocal given patient discomfort 2/2 suprapubic tenderness  Gait: not observed during encounter  ASSESSMENT/PLAN Calvin [name] is a 76 y.o. male with PMHx significant for BPH, hypertension, nephrolithiasis, alcohol use who presents with transient confusion and hypotension. On exam, had LLE weakness and on head CT found to have R pontine stroke deemed beyond TNK intervention window.  Right pontine  stroke, likely small vessel disease Code Stroke CT head Low-density in the right para median pons consistent with stroke of indeterminate age but was not visible in February of this year and therefore could be recent. Chronic small vessel ischemic disease. Atrophy.  CTA head & neck generalized atherosclerosis most notably causing 60% narrowing at the origin of the non dominant left vertebral artery. No irregularity or significant stenosis at the level of the basilar. MRI  shows acute infarction in the right para median pons.  2D Echo EF 60-65%, mild LVH LDL 92 HgbA1c pending UDS negative VTE prophylaxis - enoxaparin No antithrombotic prior to admission, now on aspirin 81 mg daily and clopidogrel 75 mg daily for 21 days, followed by aspirin 81 mg daily alone indefinitely.  Therapy recommendations:  pending Disposition:  pending therapy recs, medical clearance  Hypertension, chronic Home meds: amlodipine, resumed in hospital Stable Long-term BP goal normotensive  Hyperlipidemia Home meds:  atorvastatin 20 mg daily, increased to 40 mg in hospital LDL 92, goal < 70 Continue statin at discharge  Other Stroke Risk Factors Advanced Age >/= 61  ETOH use, alcohol level 214, advised to stop drinking or decrease intake  Other Active Problems #Encephalopathy #UTI #Foley catheter in place #Traumatic hypospadias #BPH with outflow obstruction #Depression #Anxiety  Hospital day # 0  ATTENDING NOTE: I reviewed above note and agree with the assessment and plan. Pt was seen and examined.   76 year old male with history of hypertension, hyperlipidemia, alcohol abuse, BPH admitted for confusion and altered mental status, slid out of wheelchair with possible hypotension and hypoxia.  Symptoms improved in the ER.  CT showed right pontine infarct.  MRI confirmed right pontine infarct.  CTA head and neck showed left VA origin stenosis and right V4 and bilateral ICA Bulb atherosclerosis. EF 60 to 65%,  LDL 92, A1c pending, UDS negative.  Creatinine 1.00.  UA WBC 21-50.    Etiology for patient stroke likely due to small vessel disease.  Currently on aspirin 81 and the Plavix 75 DAPT for 3 weeks and then aspirin alone.  Increase Lipitor 20-40 given elevated LDL  not at goal.  PT/OT recommend SNF.  Limitation of alcohol education provided.  Stroke risk factor modification.  Patient also complaining of stomach ache, abdominal pain with diarrhea.  Currently has Foley catheter due to BPH.  On Proscar and Flomax.  For detailed assessment and plan, please refer to above/below as I have made changes wherever appropriate.   Neurology will sign off. Please call with questions. Pt will follow up with Dr. Carles Collet at [facility] on 07/17/21. Thanks for the consult.   Rosalin Hawking, MD PhD Stroke Neurology 05/28/2022 6:25 PM    To contact Stroke Continuity provider, please refer to [email or web address] After hours, contact General Neurology

## 2022-05-28 NOTE — ED Notes (Signed)
Placed patient on the bedpan , small BM patient cleaned and warm blankets given

## 2022-05-29 ENCOUNTER — Telehealth: Payer: Self-pay | Admitting: Neurology

## 2022-05-29 DIAGNOSIS — F10139 Alcohol abuse with withdrawal, unspecified: Secondary | ICD-10-CM | POA: Diagnosis not present

## 2022-05-29 DIAGNOSIS — T83518A Infection and inflammatory reaction due to other urinary catheter, initial encounter: Secondary | ICD-10-CM | POA: Diagnosis not present

## 2022-05-29 DIAGNOSIS — Y738 Miscellaneous gastroenterology and urology devices associated with adverse incidents, not elsewhere classified: Secondary | ICD-10-CM | POA: Diagnosis not present

## 2022-05-29 DIAGNOSIS — Y846 Urinary catheterization as the cause of abnormal reaction of the patient, or of later complication, without mention of misadventure at the time of the procedure: Secondary | ICD-10-CM | POA: Diagnosis not present

## 2022-05-29 DIAGNOSIS — F10120 Alcohol abuse with intoxication, uncomplicated: Secondary | ICD-10-CM | POA: Diagnosis not present

## 2022-05-29 DIAGNOSIS — N138 Other obstructive and reflux uropathy: Secondary | ICD-10-CM | POA: Diagnosis not present

## 2022-05-29 DIAGNOSIS — E8809 Other disorders of plasma-protein metabolism, not elsewhere classified: Secondary | ICD-10-CM | POA: Diagnosis not present

## 2022-05-29 DIAGNOSIS — Z978 Presence of other specified devices: Secondary | ICD-10-CM | POA: Diagnosis not present

## 2022-05-29 DIAGNOSIS — I6381 Other cerebral infarction due to occlusion or stenosis of small artery: Secondary | ICD-10-CM | POA: Diagnosis not present

## 2022-05-29 DIAGNOSIS — N401 Enlarged prostate with lower urinary tract symptoms: Secondary | ICD-10-CM | POA: Diagnosis not present

## 2022-05-29 DIAGNOSIS — N3090 Cystitis, unspecified without hematuria: Secondary | ICD-10-CM | POA: Diagnosis present

## 2022-05-29 DIAGNOSIS — Z1629 Resistance to other single specified antibiotic: Secondary | ICD-10-CM | POA: Diagnosis not present

## 2022-05-29 DIAGNOSIS — E872 Acidosis, unspecified: Secondary | ICD-10-CM | POA: Diagnosis not present

## 2022-05-29 DIAGNOSIS — F32A Depression, unspecified: Secondary | ICD-10-CM | POA: Diagnosis not present

## 2022-05-29 DIAGNOSIS — F418 Other specified anxiety disorders: Secondary | ICD-10-CM | POA: Diagnosis not present

## 2022-05-29 DIAGNOSIS — E876 Hypokalemia: Secondary | ICD-10-CM | POA: Diagnosis not present

## 2022-05-29 DIAGNOSIS — W050XXA Fall from non-moving wheelchair, initial encounter: Secondary | ICD-10-CM | POA: Diagnosis present

## 2022-05-29 DIAGNOSIS — E785 Hyperlipidemia, unspecified: Secondary | ICD-10-CM | POA: Diagnosis not present

## 2022-05-29 DIAGNOSIS — I639 Cerebral infarction, unspecified: Secondary | ICD-10-CM | POA: Diagnosis not present

## 2022-05-29 DIAGNOSIS — L8990 Pressure ulcer of unspecified site, unspecified stage: Secondary | ICD-10-CM | POA: Insufficient documentation

## 2022-05-29 DIAGNOSIS — R69 Illness, unspecified: Secondary | ICD-10-CM | POA: Diagnosis not present

## 2022-05-29 DIAGNOSIS — E871 Hypo-osmolality and hyponatremia: Secondary | ICD-10-CM | POA: Diagnosis not present

## 2022-05-29 DIAGNOSIS — Z8616 Personal history of COVID-19: Secondary | ICD-10-CM | POA: Diagnosis not present

## 2022-05-29 DIAGNOSIS — N390 Urinary tract infection, site not specified: Secondary | ICD-10-CM | POA: Diagnosis not present

## 2022-05-29 DIAGNOSIS — Y907 Blood alcohol level of 200-239 mg/100 ml: Secondary | ICD-10-CM | POA: Diagnosis not present

## 2022-05-29 DIAGNOSIS — I10 Essential (primary) hypertension: Secondary | ICD-10-CM | POA: Diagnosis not present

## 2022-05-29 DIAGNOSIS — Z1611 Resistance to penicillins: Secondary | ICD-10-CM | POA: Diagnosis not present

## 2022-05-29 DIAGNOSIS — B964 Proteus (mirabilis) (morganii) as the cause of diseases classified elsewhere: Secondary | ICD-10-CM | POA: Diagnosis not present

## 2022-05-29 DIAGNOSIS — L89151 Pressure ulcer of sacral region, stage 1: Secondary | ICD-10-CM | POA: Diagnosis not present

## 2022-05-29 DIAGNOSIS — Z79899 Other long term (current) drug therapy: Secondary | ICD-10-CM | POA: Diagnosis not present

## 2022-05-29 DIAGNOSIS — Z8673 Personal history of transient ischemic attack (TIA), and cerebral infarction without residual deficits: Secondary | ICD-10-CM | POA: Diagnosis present

## 2022-05-29 DIAGNOSIS — R4182 Altered mental status, unspecified: Secondary | ICD-10-CM | POA: Diagnosis not present

## 2022-05-29 DIAGNOSIS — I959 Hypotension, unspecified: Secondary | ICD-10-CM | POA: Diagnosis not present

## 2022-05-29 DIAGNOSIS — G312 Degeneration of nervous system due to alcohol: Secondary | ICD-10-CM | POA: Diagnosis not present

## 2022-05-29 DIAGNOSIS — N400 Enlarged prostate without lower urinary tract symptoms: Secondary | ICD-10-CM | POA: Diagnosis not present

## 2022-05-29 DIAGNOSIS — F419 Anxiety disorder, unspecified: Secondary | ICD-10-CM | POA: Diagnosis present

## 2022-05-29 DIAGNOSIS — A498 Other bacterial infections of unspecified site: Secondary | ICD-10-CM | POA: Diagnosis not present

## 2022-05-29 DIAGNOSIS — N320 Bladder-neck obstruction: Secondary | ICD-10-CM | POA: Diagnosis not present

## 2022-05-29 LAB — BASIC METABOLIC PANEL
Anion gap: 15 (ref 5–15)
BUN: 17 mg/dL (ref 8–23)
CO2: 23 mmol/L (ref 22–32)
Calcium: 9.6 mg/dL (ref 8.9–10.3)
Chloride: 95 mmol/L — ABNORMAL LOW (ref 98–111)
Creatinine, Ser: 0.86 mg/dL (ref 0.61–1.24)
GFR, Estimated: >60 mL/min (ref >60)
Glucose, Bld: 154 mg/dL — ABNORMAL HIGH (ref 70–99)
Potassium: 3.5 mmol/L (ref 3.5–5.1)
Sodium: 133 mmol/L — ABNORMAL LOW (ref 135–145)

## 2022-05-29 LAB — CBC
HCT: 42.8 % (ref 39.0–52.0)
Hemoglobin: 15.5 g/dL (ref 13.0–17.0)
MCH: 32.2 pg (ref 26.0–34.0)
MCHC: 36.2 g/dL — ABNORMAL HIGH (ref 30.0–36.0)
MCV: 88.8 fL (ref 80.0–100.0)
Platelets: 331 10*3/uL (ref 150–400)
RBC: 4.82 MIL/uL (ref 4.22–5.81)
RDW: 14.1 % (ref 11.5–15.5)
WBC: 15.3 10*3/uL — ABNORMAL HIGH (ref 4.0–10.5)
nRBC: 0 % (ref 0.0–0.2)

## 2022-05-29 LAB — MAGNESIUM: Magnesium: 1.8 mg/dL (ref 1.7–2.4)

## 2022-05-29 MED ORDER — LIDOCAINE HCL URETHRAL/MUCOSAL 2 % EX GEL
1.0000 | Freq: Once | CUTANEOUS | Status: AC
  Administered 2022-05-29: 1 via URETHRAL
  Filled 2022-05-29: qty 6

## 2022-05-29 MED ORDER — SODIUM CHLORIDE 0.9 % IV SOLN: INTRAVENOUS | Status: DC

## 2022-05-29 MED ORDER — LACTATED RINGERS IV SOLN: INTRAVENOUS | Status: DC

## 2022-05-29 MED ORDER — MAGNESIUM SULFATE 2 GM/50ML IV SOLN
2.0000 g | Freq: Once | INTRAVENOUS | Status: AC
  Administered 2022-05-29: 2 g via INTRAVENOUS
  Filled 2022-05-29: qty 50

## 2022-05-29 NOTE — NC FL2 (Signed)
[place] LEVEL OF CARE FORM     IDENTIFICATION  Patient Name: Calvin [name] Birthdate: 1945-11-17 Sex: male Admission Date (Current Location): 05/27/2022  [facility] [place] Number:  Herbalist and Address:  [facility], [place] [address] [place], [place] [address]      Provider Number: [id number]  Attending Physician Name and Address:  [place] Eric J, DO  Relative Name and Phone Number:  Vickii Chafe [phone]    Current Level of Care: Hospital Recommended Level of Care: [place] Prior Approval Number:    Date Approved/Denied:   PASRR Number: [id number]  Discharge Plan: SNF    Current Diagnoses: Patient Active Problem List   Diagnosis Date Noted   CVA (cerebral vascular accident) ([place]) 05/29/2022   Pressure injury of skin 05/29/2022   Acute CVA (cerebrovascular accident) ([place]) 05/27/2022   UTI (urinary tract infection) 05/27/2022   Indwelling Foley catheter present 05/27/2022   Bladder outflow obstruction 03/18/2022   B12 deficiency 02/06/2022   Bilateral leg weakness 01/10/2022   Depression with anxiety 12/25/2021   Alcohol abuse 12/25/2021   Primary osteoarthritis of left knee 04/23/2018   Hypogonadism in male 01/19/2016   Foot swelling 01/19/2016   Insomnia 10/30/2013   INTERNAL HEMORRHOIDS WITH OTHER COMPLICATION 05/69/7948   Enlarged prostate 10/25/2008   Headache 09/15/2007   Essential hypertension 07/07/2007    Orientation RESPIRATION BLADDER Height & Weight          External catheter, Incontinent Weight:   Height:     BEHAVIORAL SYMPTOMS/MOOD NEUROLOGICAL BOWEL NUTRITION STATUS      Incontinent Diet (see d/c summary)  AMBULATORY STATUS COMMUNICATION OF NEEDS Skin   Extensive Assist Verbally Normal                       Personal Care Assistance Level of Assistance  Dressing, Feeding, Bathing Bathing Assistance: Maximum assistance Feeding assistance: Limited  assistance Dressing Assistance: Maximum assistance     Functional Limitations Info  Sight, Hearing, Speech Sight Info: Adequate Hearing Info: Adequate Speech Info: Impaired    SPECIAL CARE FACTORS FREQUENCY  PT (By licensed PT), OT (By licensed OT)     PT Frequency: 5x/wk OT Frequency: 5x/wk            Contractures Contractures Info: Not present    Additional Factors Info  Code Status Code Status Info: Full             Current Medications (05/29/2022):  This is the current hospital active medication list Current Facility-Administered Medications  Medication Dose Route Frequency Provider Last Rate Last Admin   0.9 %  sodium chloride infusion   Intravenous Continuous [place] Eric J, DO 100 mL/hr at 05/29/22 1006 New Bag at 05/29/22 1006   acetaminophen (TYLENOL) tablet 650 mg  650 mg Oral Q4H PRN Marcelyn Bruins, MD       Or   acetaminophen (TYLENOL) 160 MG/5ML solution 650 mg  650 mg Per Tube Q4H PRN Marcelyn Bruins, MD       Or   acetaminophen (TYLENOL) suppository 650 mg  650 mg Rectal Q4H PRN Marcelyn Bruins, MD       aspirin EC tablet 81 mg  81 mg Oral Daily Marcelyn Bruins, MD   81 mg at 05/29/22 1007   atorvastatin (LIPITOR) tablet 40 mg  40 mg Oral Daily Rosalin Hawking, MD   40 mg at 05/29/22 1007   cefTRIAXone (ROCEPHIN)  1 g in sodium chloride 0.9 % 100 mL IVPB  1 g Intravenous Q24H Marcelyn Bruins, MD 200 mL/hr at 05/29/22 0329 1 g at 05/29/22 0329   Chlorhexidine Gluconate Cloth 2 % PADS 6 each  6 each Topical Daily [place] Eric J, DO   6 each at 05/29/22 1008   clopidogrel (PLAVIX) tablet 75 mg  75 mg Oral Daily Marcelyn Bruins, MD   75 mg at 05/29/22 1007   enoxaparin (LOVENOX) injection 40 mg  40 mg Subcutaneous Q24H Marcelyn Bruins, MD   40 mg at 05/28/22 2037   finasteride (PROSCAR) tablet 5 mg  5 mg Oral Daily Marcelyn Bruins, MD   5 mg at 99/24/26 8341   folic acid (FOLVITE) tablet 1 mg  1 mg Oral Daily [place] Donnamarie Poag, DO    1 mg at 05/29/22 1007   hydrALAZINE (APRESOLINE) tablet 25 mg  25 mg Oral Q8H PRN [place] Donnamarie Poag, DO       lactated ringers infusion   Intravenous Continuous Kristopher Oppenheim, DO 100 mL/hr at 05/29/22 0414 New Bag at 05/29/22 0414   loperamide (IMODIUM) capsule 2 mg  2 mg Oral Q8H PRN Marcelyn Bruins, MD       LORazepam (ATIVAN) tablet 1-4 mg  1-4 mg Oral Q1H PRN [place] Eric J, DO   3 mg at 05/28/22 1711   Or   LORazepam (ATIVAN) injection 1-4 mg  1-4 mg Intravenous Q1H PRN [place] Donnamarie Poag, DO   2 mg at 05/29/22 0407   magnesium sulfate IVPB 2 g 50 mL  2 g Intravenous Once [place] Eric J, DO 50 mL/hr at 05/29/22 1007 2 g at 05/29/22 1007   multivitamin with minerals tablet 1 tablet  1 tablet Oral Daily [place] Eric J, DO   1 tablet at 05/29/22 1008   senna-docusate (Senokot-S) tablet 1 tablet  1 tablet Oral QHS PRN Marcelyn Bruins, MD       sertraline (ZOLOFT) tablet 100 mg  100 mg Oral Daily Marcelyn Bruins, MD   100 mg at 05/29/22 1007   tamsulosin (FLOMAX) capsule 0.4 mg  0.4 mg Oral Daily Marcelyn Bruins, MD   0.4 mg at 05/29/22 1008   thiamine (VITAMIN B1) tablet 100 mg  100 mg Oral Daily [place] Eric J, DO   100 mg at 05/29/22 1008   Or   thiamine (VITAMIN B1) injection 100 mg  100 mg Intravenous Daily [place] Eric J, DO         Discharge Medications: Please see discharge summary for a list of discharge medications.  Relevant Imaging Results:  Relevant Lab Results:   Additional Information SS#: [id number]  Jinger Neighbors, LCSW

## 2022-05-29 NOTE — TOC Initial Note (Addendum)
Transition of Care [facility]) - Initial/Assessment Note    Patient Details  Name: Calvin [name] [name]: [id number] Date of Birth: 07-25-1945  Transition of [facility]) CM/SW Contact:    Calvin Neighbors, LCSW Phone Number: 05/29/2022, 10:03 AM  Clinical Narrative:                 CSW attempted to meet with pt to complete assessment; however, pt unable to wake up to converse. CSW called Calvin [name], family support listed in pt's contacts. Calvin [name] reports pt came from home with paid supports in place to assist with ADLs. Past SNF 10/[facility].  Work up [employer] out complete.   Expected Discharge Plan: Skilled Nursing Facility Barriers to Discharge: Continued Medical Work up   Patient Goals and CMS Choice   CMS Medicare.gov Compare Post Acute Care list provided to:: Patient Represenative (must comment) Chief Operating Officer) Choice offered to / list presented to :  ([place] friend, Clinical cytogeneticist)  Expected Discharge Plan and Services Expected Discharge Plan: [place]                                              Prior Living Arrangements/Services     Patient language and need for interpreter reviewed:: Yes Do you feel safe going back to the place where you live?: Yes      Need for Family Participation in Patient Care: Yes (Comment) Care giver support system in place?: Yes (comment)   Criminal Activity/Legal Involvement Pertinent to Current Situation/Hospitalization: No - Comment as needed  Activities of Daily Living      Permission Sought/Granted                  Emotional Assessment   Attitude/Demeanor/Rapport: Unable to Assess Affect (typically observed): Unable to Assess   Alcohol / Substance Use: Alcohol Use    Admission diagnosis:  Right pontine stroke ([place]) [I63.50] Cystitis [N30.90] Acute CVA (cerebrovascular accident) ([place]) [X21.1] Alcoholic intoxication without complication ([place]) [H41.740] CVA (cerebral vascular accident) [facility]) [I63.9] Patient  Active Problem List   Diagnosis Date Noted   CVA (cerebral vascular accident) ([place]) 05/29/2022   Pressure injury of skin 05/29/2022   Acute CVA (cerebrovascular accident) ([place]) 05/27/2022   UTI (urinary tract infection) 05/27/2022   Indwelling Foley catheter present 05/27/2022   Bladder outflow obstruction 03/18/2022   B12 deficiency 02/06/2022   Bilateral leg weakness 01/10/2022   Depression with anxiety 12/25/2021   Alcohol abuse 12/25/2021   Primary osteoarthritis of left knee 04/23/2018   Hypogonadism in male 01/19/2016   Foot swelling 01/19/2016   Insomnia 10/30/2013   INTERNAL HEMORRHOIDS WITH OTHER COMPLICATION 81/44/8185   Enlarged prostate 10/25/2008   Headache 09/15/2007   Essential hypertension 07/07/2007   PCP:  Calvin Morale, MD Pharmacy:   [id number] [place] [place]Phone: [phone]: [phone]    Social Determinants of Health (SDOH) Interventions    Readmission Risk Interventions    03/19/2022    2:17 PM 02/27/2022    2:30 PM  Readmission Risk Prevention Plan  Post Dischage Appt Complete Complete  Medication Screening Complete Complete  Transportation Screening Complete Complete

## 2022-05-29 NOTE — Progress Notes (Addendum)
PROGRESS NOTE    Calvin [name]  TOI:[id number] DOB: March 26, 1946 DOA: 05/27/2022 PCP: Laurey Morale, MD    Brief Narrative:   Calvin [name] is a 76 y.o. male with past medical history significant for chronic bladder outlet obstruction/BPH with chronic Foley catheter, history of recurrent UTI, nephrolithiasis, HTN, anxiety/depression, EtOH use disorder, left lower extremity stress fracture currently utilizing a cam walker boot who presented via EMS to [facility] ED on 12/10 with confusion, weakness.  Patient lives alone but has a neighbor who has been helping him out more frequently.  Last known normal was night prior to admission.  On EMS arrival, patient was found sitting on the floor near his wheelchair with suspicion that he slid out of the wheelchair with unknown downtime.  He was minimally responsive to first and there was initial concern for hypotension and hypoxia.  Upon EMS standing him up his symptoms somewhat improved and he was transported to the ED for further evaluation.  Patient denied fever, no chills, no chest pain, no abdominal pain, no nausea/vomiting/diarrhea.  In the ED, temperature 97.8 F, HR 90, RR 16, BP 113/76, SpO2 96% on room air.  Sodium 141, potassium 3.4, chloride 106, CO2 23, glucose 107, BUN 9, creatinine 0.79.  Magnesium 1.9, AST 23, ALT 19, total bilirubin 0.6.  Lactic acid 2.1.  WBC 8.3, hemoglobin 13.9, platelets 283.  INR 1.1.  Urinalysis with large leukocytes, positive nitrite, few bacteria, 21-50 WBCs.  UDS negative.  CT head without contrast with low-density right paramedian pons consistent with CVA, atrophy and chronic small vessel ischemic changes of the white matter.  CT angiogram head/neck with no emergent finding, generalized atherosclerosis most notably causing 60% narrowing at the origin of the nondominant left vertebral artery.  MR brain without contrast with acute infarction right paramedial pons, no swelling or hemorrhage, generalized atrophy and moderate  chronic small vessel ischemic changes of the cerebral hemispheric white matter.  Neurology was consulted.  Hospitalist service consulted for evaluation management of acute CVA and UTI.  Assessment & Plan:   Acute ischemic CVA Presenting to the ED via EMS after being found down in his home by his neighbor on the floor with unknown downtime.  Last known normal was day prior to admission.  MR brain with acute infarction right paramedial pons.  CT angiogram head/neck with no emergent finding.  LDL 92.  Hemoglobin A1c 5.5.  TTE with LVEF 60 to 65%, mild concentric LVH, no LV regional wall motion normalities, no aortic stenosis, aortic dilation ascending aorta measuring 41 mm, IVC normal in size.  Was followed by neurology initially and now signed off. -- Neurology following, appreciate assistance -- Atorvastatin 40 mg p.o. daily -- DAPT with Aspirin 81 mg p.o. daily, Plavix 75 mg p.o. daily x 21 days followed by aspirin alone -- Outpatient follow-up with [facility] neurology, Dr. Carles Collet on 07/17/2021.  Urinary tract infection Complicated by this requirement of chronic Foley catheter use.  Follows with urology outpatient.  Review of previous urine cultures all notable for E. coli and only resistance to ampicillin. -- Blood cultures x 2: No growth less than 24 hours -- Urine culture: Pending -- Ceftriaxone 1 g IV every 24 hours  Hypokalemia Repleted during the hospitalization. -- Repeat electrolytes in a.m.  History of bladder outlet obstruction/BPH with chronic Foley catheter Patient with chronic Foley catheter in place on admission, developed obstruction during hospitalization and was exchanged on 05/29/2022 with 18 French catheter. -- Continue Foley catheter -- Tamsulosin 0.4  mg p.o. daily -- Finasteride 5 mg p.o. daily -- Continue outpatient follow-up with urology -- Continue to monitor urinary output, bladder scan as needed  Essential hypertension:  Home medication include Norvasc 5 mg p.o.  daily, losartan '100mg'$  p.o. daily. -- Holding antihypertensives to allow permissive hypertension in the first 24hours following acute CVA up to 220/120. --continue to monitor BP closely  Anxiety/depression: -- Zoloft 100 mg p.o. daily  Left lower extremity stress fracture Patient follows with orthopedics outpatient, currently utilizing a cam walker boot for ambulation. --Weightbearing as tolerates left lower extremity -- Continue outpatient follow-up with orthopedics  EtOH use disorder/intoxication/withdrawal EtOH level elevated to 14 on admission.  Counseled on need for complete cessation --CIWA protocol  Weakness/debility/deconditioning: -- PT/OT recommend SNF placement, TOC for evaluation   DVT prophylaxis: enoxaparin (LOVENOX) injection 40 mg Start: 05/27/22 2200    Code Status: Full Code Family Communication: No family present at bedside this morning  Disposition Plan:  Level of care: Telemetry Medical Status is: Inpatient Remains inpatient appropriate because: Continues with active alcohol withdrawal, pending placement    Consultants:  Neurology Urology  Procedures:  TTE:  Foley catheter replaced 12/12  Antimicrobials:  None   Subjective: Patient seen examined bedside, confused.  Continues undergoing alcohol withdrawal.  RN reports decreased urine output with greater than '9 9 9 '$ mL on bladder scan.  Foley catheter replaced with 2 L output initially.  Therapy currently recommending SNF placement, TOC aware.  Unable to obtain any further ROS from patient this morning due to his current mental status, recently received Ativan.  No other acute concerns overnight per nursing staff.  Objective: Vitals:   05/28/22 2341 05/29/22 0401 05/29/22 0824 05/29/22 1105  BP: (!) 167/114 (!) 158/117 (!) 123/105 114/82  Pulse: 83 (!) 109 (!) 102 98  Resp: (!) 23 (!) 21 (!) 22 20  Temp: 98.4 F (36.9 C) 98.5 F (36.9 C) (!) 97.5 F (36.4 C)   TempSrc: Oral Oral Axillary  Axillary  SpO2: (!) 87% 99% 100%    No intake or output data in the 24 hours ending 05/29/22 1318  There were no vitals filed for this visit.  Examination:  Physical Exam: GEN: Confused, chronically ill in appearance, appears older than stated age HEENT: NCAT, PERRL, EOMI, sclera clear, MMM PULM: CTAB w/o wheezes/crackles, normal respiratory effort, on room air CV: RRR w/o M/G/R GI: abd soft, NTND, NABS, no R/G/M GU: Foley catheter noted MSK: no peripheral edema, moves all extremities independently Integumentary: dry/intact, no rashes or wounds    Data Reviewed: I have personally reviewed following labs and imaging studies  CBC: Recent Labs  Lab 05/27/22 1215 05/27/22 1222 05/28/22 0250 05/29/22 0257  WBC 8.3  --  10.3 15.3*  NEUTROABS 5.4  --   --   --   HGB 13.9 13.6 14.4 15.5  HCT 40.5 40.0 40.9 42.8  MCV 94.4  --  92.5 88.8  PLT 283  --  279 557   Basic Metabolic Panel: Recent Labs  Lab 05/27/22 1215 05/27/22 1222 05/29/22 0257  NA 141 143 133*  K 3.4* 3.5 3.5  CL 106 105 95*  CO2 23  --  23  GLUCOSE 107* 105* 154*  BUN '9 9 17  '$ CREATININE 0.79 1.00 0.86  CALCIUM 9.0  --  9.6  MG 1.9  --  1.8   GFR: CrCl cannot be calculated (Unknown ideal weight.). Liver Function Tests: Recent Labs  Lab 05/27/22 1215  AST 23  ALT 19  ALKPHOS 88  BILITOT 0.6  PROT 7.1  ALBUMIN 3.6   No results for input(s): "LIPASE", "AMYLASE" in the last 168 hours. No results for input(s): "AMMONIA" in the last 168 hours. Coagulation Profile: Recent Labs  Lab 05/27/22 1215  INR 1.1   Cardiac Enzymes: Recent Labs  Lab 05/27/22 1215  CKTOTAL 49   BNP (last 3 results) No results for input(s): "PROBNP" in the last 8760 hours. HbA1C: Recent Labs    05/28/22 0250  HGBA1C 5.5   CBG: No results for input(s): "GLUCAP" in the last 168 hours. Lipid Profile: Recent Labs    05/28/22 0250  CHOL 173  HDL 59  LDLCALC 92  TRIG 111  CHOLHDL 2.9   Thyroid Function  Tests: No results for input(s): "TSH", "T4TOTAL", "FREET4", "T3FREE", "THYROIDAB" in the last 72 hours. Anemia Panel: No results for input(s): "VITAMINB12", "FOLATE", "FERRITIN", "TIBC", "IRON", "RETICCTPCT" in the last 72 hours. Sepsis Labs: Recent Labs  Lab 05/27/22 1215 05/27/22 1535  LATICACIDVEN 2.1* 2.1*    Recent Results (from the past 240 hour(s))  Blood culture (routine x 2)     Status: None (Preliminary result)   Collection Time: 05/27/22 12:15 PM   Specimen: BLOOD  Result Value Ref Range Status   Specimen Description BLOOD SITE NOT SPECIFIED  Final   Special Requests   Final    BOTTLES DRAWN AEROBIC AND ANAEROBIC Blood Culture adequate volume   Culture   Final    NO GROWTH < 24 HOURS Performed at [facility], [place] [address] [place], [place] [address]    Report Status PENDING  Incomplete  Blood culture (routine x 2)     Status: None (Preliminary result)   Collection Time: 05/27/22 12:15 PM   Specimen: BLOOD  Result Value Ref Range Status   Specimen Description BLOOD SITE NOT SPECIFIED  Final   Special Requests   Final    BOTTLES DRAWN AEROBIC AND ANAEROBIC Blood Culture adequate volume   Culture   Final    NO GROWTH < 24 HOURS Performed at [facility], [place] [address], [place], [place] [address]    Report Status PENDING  Incomplete  Culture, Urine (Do not remove urinary catheter, catheter placed by urology or difficult to place)     Status: Abnormal (Preliminary result)   Collection Time: 05/28/22  7:41 AM   Specimen: Urine, Catheterized  Result Value Ref Range Status   Specimen Description URINE, CATHETERIZED  Final   Special Requests NONE  Final   Culture (A)  Final    10,000 COLONIES/mL PROTEUS MIRABILIS SUSCEPTIBILITIES TO FOLLOW Performed at [facility], [place] [address] [place], [place] [address]    Report Status PENDING  Incomplete         Radiology Studies: ECHOCARDIOGRAM COMPLETE  Result Date: 05/28/2022     ECHOCARDIOGRAM REPORT   Patient Name:   Calvin [name] Date of Exam: 05/28/2022 Medical Rec #:  [id number]      Height:       72.0 in Accession #:    [id number]     Weight:       220.0 lb Date of Birth:  05-19-1946      BSA:          2.219 m Patient Age:    73 years       BP:           143/103 mmHg Patient Gender: M  HR:           107 bpm. Exam Location:  Inpatient Procedure: 2D Echo Indications:    stroke  History:        Patient has prior history of Echocardiogram examinations, most                 recent 07/20/2021. Risk Factors:Hypertension.  Sonographer:    Johny Chess RDCS Referring Phys: [id number] Candace Gallus MELVIN  Sonographer Comments: Technically difficult study due to poor echo windows. Image acquisition challenging due to respiratory motion. IMPRESSIONS  1. Left ventricular ejection fraction, by estimation, is 60 to 65%. The left ventricle has normal function. The left ventricle has no regional wall motion abnormalities. There is mild concentric left ventricular hypertrophy. Left ventricular diastolic function could not be evaluated.  2. Right ventricular systolic function is normal. The right ventricular size is normal.  3. The mitral valve is grossly normal. No evidence of mitral valve regurgitation. No evidence of mitral stenosis.  4. The aortic valve was not well visualized. Aortic valve regurgitation is not visualized. No aortic stenosis is present.  5. Aortic dilatation noted. There is mild dilatation of the ascending aorta, measuring 41 mm.  6. The inferior vena cava is normal in size with greater than 50% respiratory variability, suggesting right atrial pressure of 3 mmHg. Comparison(s): No significant change from prior study. Conclusion(s)/Recommendation(s): No intracardiac source of embolism detected on this transthoracic study. Consider a transesophageal echocardiogram to exclude cardiac source of embolism if clinically indicated. Cannot exclude atrial fibrillation based on   tracings--recommend confirming on telemetry/ECG. FINDINGS  Left Ventricle: Left ventricular ejection fraction, by estimation, is 60 to 65%. The left ventricle has normal function. The left ventricle has no regional wall motion abnormalities. The left ventricular internal cavity size was normal in size. There is  mild concentric left ventricular hypertrophy. Left ventricular diastolic function could not be evaluated. Right Ventricle: The right ventricular size is normal. No increase in right ventricular wall thickness. Right ventricular systolic function is normal. Left Atrium: Left atrial size was normal in size. Right Atrium: Right atrial size was normal in size. Pericardium: There is no evidence of pericardial effusion. Mitral Valve: The mitral valve is grossly normal. No evidence of mitral valve regurgitation. No evidence of mitral valve stenosis. Tricuspid Valve: The tricuspid valve is grossly normal. Tricuspid valve regurgitation is not demonstrated. No evidence of tricuspid stenosis. Aortic Valve: The aortic valve was not well visualized. Aortic valve regurgitation is not visualized. No aortic stenosis is present. Pulmonic Valve: The pulmonic valve was not well visualized. Pulmonic valve regurgitation is not visualized. Aorta: Aortic dilatation noted. There is mild dilatation of the ascending aorta, measuring 41 mm. Venous: The inferior vena cava is normal in size with greater than 50% respiratory variability, suggesting right atrial pressure of 3 mmHg. IAS/Shunts: The interatrial septum was not well visualized. Additional Comments: There is a small pleural effusion in the left lateral region.  LEFT VENTRICLE PLAX 2D LVIDd:         4.40 cm LVIDs:         2.90 cm LV PW:         1.20 cm LV IVS:        1.30 cm LVOT diam:     2.30 cm LV SV:         66 LV SV Index:   30 LVOT Area:     4.15 cm  IVC IVC diam: 1.80 cm LEFT ATRIUM  Index        RIGHT ATRIUM           Index LA diam:        3.90 cm 1.76 cm/m    RA Area:     14.50 cm LA Vol (A2C):   53.8 ml 24.25 ml/m  RA Volume:   29.70 ml  13.39 ml/m LA Vol (A4C):   42.7 ml 19.25 ml/m LA Biplane Vol: 47.9 ml 21.59 ml/m  AORTIC VALVE LVOT Vmax:   77.10 cm/s LVOT Vmean:  51.400 cm/s LVOT VTI:    0.160 m  AORTA Ao Root diam: 3.50 cm Ao Asc diam:  4.10 cm  SHUNTS Systemic VTI:  0.16 m Systemic Diam: 2.30 cm Buford Dresser MD Electronically signed by Buford Dresser MD Signature Date/Time: 05/28/2022/2:13:05 PM    Final    CT ANGIO HEAD NECK W WO CM  Result Date: 05/28/2022 CLINICAL DATA:  Stroke workup EXAM: CT ANGIOGRAPHY HEAD AND NECK TECHNIQUE: Multidetector CT imaging of the head and neck was performed using the standard protocol during bolus administration of intravenous contrast. Multiplanar CT image reconstructions and MIPs were obtained to evaluate the vascular anatomy. Carotid stenosis measurements (when applicable) are obtained utilizing NASCET criteria, using the distal internal carotid diameter as the denominator. RADIATION DOSE REDUCTION: This exam was performed according to the departmental dose-optimization program which includes automated exposure control, adjustment of the mA and/or kV according to patient size and/or use of iterative reconstruction technique. CONTRAST:  52m OMNIPAQUE IOHEXOL 350 MG/ML SOLN COMPARISON:  Brain MRI from yesterday FINDINGS: CT HEAD FINDINGS Brain: Known right pontine stroke. Generalized brain atrophy with ventriculomegaly. Mild chronic small vessel ischemia in the deep white matter. No hemorrhage, mass, or collection. Vascular: No acute finding Skull: No acute finding Sinuses/Orbits: Mucosal thickening in the left sphenoid sinus Review of the MIP images confirms the above findings CTA NECK FINDINGS Aortic arch: Mild atheromatous plaque Right carotid system: Atheromatous wall thickening of the common carotid with heavily calcified plaque at the bifurcation. No ulceration or significant stenosis Left  carotid system: Mild calcified plaque at the bifurcation without stenosis or ulceration Vertebral arteries: No proximal subclavian stenosis. Dominant right vertebral artery. Left vertebral origin plaque causes 60% narrowing based on coronal reformats. The left vertebral artery ends in the PICA Skeleton: Ordinary cervical spine degeneration. Other neck: No acute finding Upper chest: Clear apical lungs Review of the MIP images confirms the above findings CTA HEAD FINDINGS Anterior circulation: Atheromatous calcification of the carotid siphons. Intracranial branch atheromatous irregularity that is mild. No flow reducing stenosis. No branch occlusion, beading, or aneurysm Posterior circulation: Atheromatous calcification of the right vertebral artery and basilar without flow reducing stenosis. The left vertebral artery terminates in the left PICA. No branch occlusion, beading, or aneurysm Venous sinuses: Unremarkable for the arterial phase Anatomic variants: None significant Review of the MIP images confirms the above findings IMPRESSION: 1. No emergent finding. 2. Generalized atherosclerosis most notably causing 60% narrowing at the origin of the non dominant left vertebral artery. 3. No irregularity or significant stenosis at the level of the basilar. Electronically Signed   By: JJorje GuildM.D.   On: 05/28/2022 05:57   MR BRAIN WO CONTRAST  Result Date: 05/27/2022 CLINICAL DATA:  Neuro deficit, acute, stroke suspected. Fell from wheelchair. Slurred speech and left-sided weakness. EXAM: MRI HEAD WITHOUT CONTRAST TECHNIQUE: Multiplanar, multiecho pulse sequences of the brain and surrounding structures were obtained without intravenous contrast. COMPARISON:  Head CT earlier same day  FINDINGS: Brain: Diffusion imaging confirms the presence of an acute stroke in the right para median pons. No gross swelling or hemorrhage. No other acute infarction. No focal cerebellar insult. Cerebral hemispheres show generalized  atrophy with low moderate chronic small-vessel ischemic changes of the white matter. No supra tentorial large vessel stroke. No mass, hemorrhage, hydrocephalus or extra-axial collection. Vascular: Major vessels at the base of the brain show flow. Skull and upper cervical spine: Negative Sinuses/Orbits: Inflammatory changes of the sphenoid sinus. The other sinuses are clear. Orbits negative. Other: None IMPRESSION: 1. Acute infarction in the right para median pons. No swelling or hemorrhage. 2. Generalized atrophy. Moderate chronic small-vessel ischemic changes of the cerebral hemispheric white matter. Electronically Signed   By: Nelson Chimes M.D.   On: 05/27/2022 17:16   CT HEAD WO CONTRAST  Result Date: 05/27/2022 CLINICAL DATA:  Multiple neuro deficit, acute, stroke suspected. Mental status change. EXAM: CT HEAD WITHOUT CONTRAST TECHNIQUE: Contiguous axial images were obtained from the base of the skull through the vertex without intravenous contrast. RADIATION DOSE REDUCTION: This exam was performed according to the departmental dose-optimization program which includes automated exposure control, adjustment of the mA and/or kV according to patient size and/or use of iterative reconstruction technique. COMPARISON:  MRI 07/29/2021 FINDINGS: Brain: Low-density in the right para median pons consistent with stroke. This is of indeterminate age but was not visible in February of this year. No focal cerebellar insult. Cerebral hemispheres show atrophy, ex vacuo enlargement of the lateral ventricles, and chronic small vessel change of the white matter. No large vessel territory stroke is visible. No mass or extra-axial collection. No hemorrhage. Vascular: There is atherosclerotic calcification of the major vessels at the base of the brain. Skull: No skull fracture. Sinuses/Orbits: Chronic inflammatory changes of the sphenoid sinus. Other paranasal sinuses are clear. Mastoid air cells are clear. Orbits negative.  Other: None IMPRESSION: 1. Low-density in the right para median pons consistent with stroke. This is of indeterminate age but was not visible in February of this year and therefore could be recent. 2. Atrophy and chronic small-vessel ischemic changes of the white matter. 3. Chronic inflammatory changes of the sphenoid sinus. Electronically Signed   By: Nelson Chimes M.D.   On: 05/27/2022 14:03        Scheduled Meds:  aspirin EC  81 mg Oral Daily   atorvastatin  40 mg Oral Daily   Chlorhexidine Gluconate Cloth  6 each Topical Daily   clopidogrel  75 mg Oral Daily   enoxaparin (LOVENOX) injection  40 mg Subcutaneous Q24H   finasteride  5 mg Oral Daily   folic acid  1 mg Oral Daily   multivitamin with minerals  1 tablet Oral Daily   sertraline  100 mg Oral Daily   tamsulosin  0.4 mg Oral Daily   thiamine  100 mg Oral Daily   Or   thiamine  100 mg Intravenous Daily   Continuous Infusions:  sodium chloride 100 mL/hr at 05/29/22 1006   cefTRIAXone (ROCEPHIN)  IV 1 g (05/29/22 0329)   lactated ringers 100 mL/hr at 05/29/22 0414     LOS: 0 days    Time spent: 51 minutes spent on chart review, discussion with nursing staff, consultants, updating family and interview/physical exam; more than 50% of that time was spent in counseling and/or coordination of care.    Briany Aye J [place] DO [facility] Available via Epic secure chat 7am-7pm After these hours, please refer to coverage provider listed  on [email or web address] 05/29/2022, 1:18 PM

## 2022-05-29 NOTE — Progress Notes (Signed)
RN was called to insert caude 18 foley; took out previous straight cath 16 and inserted Caude 18 per order. Initially red and bloody clots returned with over 2000 ml UO. RN notified.

## 2022-05-29 NOTE — Progress Notes (Signed)
SLP Cancellation Note  Patient Details Name: COLSON BARCO [name]: [id number] DOB: [birth year]   Cancelled treatment:       Reason Eval/Treat Not Completed: Fatigue/lethargy limiting ability to participate. Does not appear that patient will d/c today but if so, defer cognitive-linguistic evaluation to SNF.   Darah Simkin MA, CCC-SLP    Lyndell Gillyard Meryl 05/29/2022, 9:00 AM

## 2022-05-29 NOTE — Telephone Encounter (Signed)
Currently still admitted at 1:13pm

## 2022-05-29 NOTE — Telephone Encounter (Signed)
3:21pm, still currently admitted

## 2022-05-29 NOTE — Telephone Encounter (Signed)
Once patient d/c (possibly today), please do tcm phone call and put him on schedule for Thursday in one of NP slots for hospital f/u

## 2022-05-29 NOTE — Progress Notes (Signed)
E [employer] informed of patient no urine out put and that patient is not drinking

## 2022-05-29 NOTE — Progress Notes (Signed)
   05/29/22 2929  Urine Characteristics  Urinary Interventions Bladder scan  Bladder Scan Volume (mL) (S)  999 mL   Bladder scan performed for primary nurse Davy Pique, RN. Bladder scan result above. Notified primary nurse.

## 2022-05-29 NOTE — Plan of Care (Signed)
  Problem: Education: Goal: Knowledge of disease or condition will improve Outcome: Not Met (add Reason)ETOH withdrawal Goal: Knowledge of secondary prevention will improve (MUST DOCUMENT ALL) Outcome: Not Met (add Reason)ETOH withdrawal Problem: Ischemic Stroke/TIA Tissue Perfusion: Goal: Complications of ischemic stroke/TIA will be minimized Outcome: Progressing   Problem: Coping: Goal: Will verbalize positive feelings about self Outcome: Progressing Goal: Will identify appropriate support needs Outcome: Progressing   Problem: Health Behavior/Discharge Planning: Goal: Ability to manage health-related needs will improve Outcome: Progressing Goal: Goals will be collaboratively established with patient/family Outcome: Progressing

## 2022-05-30 DIAGNOSIS — A498 Other bacterial infections of unspecified site: Secondary | ICD-10-CM

## 2022-05-30 LAB — BASIC METABOLIC PANEL
Anion gap: 11 (ref 5–15)
BUN: 17 mg/dL (ref 8–23)
CO2: 20 mmol/L — ABNORMAL LOW (ref 22–32)
Calcium: 9 mg/dL (ref 8.9–10.3)
Chloride: 103 mmol/L (ref 98–111)
Creatinine, Ser: 0.74 mg/dL (ref 0.61–1.24)
GFR, Estimated: >60 mL/min (ref >60)
Glucose, Bld: 100 mg/dL — ABNORMAL HIGH (ref 70–99)
Potassium: 3.6 mmol/L (ref 3.5–5.1)
Sodium: 134 mmol/L — ABNORMAL LOW (ref 135–145)

## 2022-05-30 LAB — URINE CULTURE: Culture: 10000 — AB

## 2022-05-30 LAB — CBC
HCT: 39.5 % (ref 39.0–52.0)
Hemoglobin: 14.1 g/dL (ref 13.0–17.0)
MCH: 32.5 pg (ref 26.0–34.0)
MCHC: 35.7 g/dL (ref 30.0–36.0)
MCV: 91 fL (ref 80.0–100.0)
Platelets: 288 10*3/uL (ref 150–400)
RBC: 4.34 MIL/uL (ref 4.22–5.81)
RDW: 14.4 % (ref 11.5–15.5)
WBC: 9.3 10*3/uL (ref 4.0–10.5)
nRBC: 0 % (ref 0.0–0.2)

## 2022-05-30 LAB — MAGNESIUM: Magnesium: 2 mg/dL (ref 1.7–2.4)

## 2022-05-30 LAB — PHOSPHORUS: Phosphorus: 3.5 mg/dL (ref 2.5–4.6)

## 2022-05-30 NOTE — Progress Notes (Signed)
Physical Therapy Treatment Patient Details Name: Calvin [name] [name]: [id number] DOB: Apr 03, 1946 Today's Date: 05/30/2022   History of Present Illness Pt is a 76 y/o male who presented after being found sitting on the floor near his wheelchair on 05/27/22. 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 EtOH elevated on arrival. Pt also admitted for treatment of UTI and hypokalemia. PMH: BPH w/ chronic foley catheter, HTN, etoh use, L lower extremity fx (WBAT w/ cam boot per hospitalist note)    PT Comments    Pt agreeable to session however continues to be limited by weakness, impaired balance/postural reactions and pain. Pt needing increased assist this date for all mobility, with pt requiring mod assist to come to sitting EOB with intermittent mod assist to maintain static sitting balance with x3 posterior LOB needing max assist to correct. Pt able to come to partial stand with RW and max-total assist however unable to power up to full upright standing. Current plan remains appropriate to address deficits and maximize functional independence and decrease caregiver burden. Pt continues to benefit from skilled PT services to progress toward functional mobility goals.    Recommendations for follow up therapy are one component of a multi-disciplinary discharge planning process, led by the attending physician.  Recommendations may be updated based on patient status, additional functional criteria and insurance authorization.  Follow Up Recommendations  Skilled nursing-short term rehab (<3 hours/day) Can patient physically be transported by private vehicle: No   Assistance Recommended at Discharge Frequent or constant Supervision/Assistance  Patient can return home with the following Two people to help with walking and/or transfers;Two people to help with bathing/dressing/bathroom;Help with stairs or ramp for entrance;Assistance with cooking/housework   Equipment Recommendations  None  recommended by PT    Recommendations for Other Services       Precautions / Restrictions Precautions Precautions: Fall;Other (comment) Precaution Comments: watch HR, BP, DOE Required Braces or Orthoses: Other Brace Other Brace: LLE cam boot Restrictions Weight Bearing Restrictions: Yes LLE Weight Bearing: Weight bearing as tolerated     Mobility  Bed Mobility Overal bed mobility: Needs Assistance Bed Mobility: Supine to Sit, Sit to Supine     Supine to sit: HOB elevated, Mod assist Sit to supine: Max assist   General bed mobility comments: mod assist to elevate trunk and return BLEs back to bed    Transfers Overall transfer level: Needs assistance Equipment used: Rolling walker (2 wheels) Transfers: Sit to/from Stand Sit to Stand: Total assist, Max assist           General transfer comment: max-total assist to attempt standing with pt able to clear hips from EOB but unable to come to full standing    Ambulation/Gait                   Stairs             Wheelchair Mobility    Modified Rankin (Stroke Patients Only) Modified Rankin (Stroke Patients Only) Pre-Morbid Rankin Score: Moderately severe disability Modified Rankin: Severe disability     Balance Overall balance assessment: Needs assistance Sitting-balance support: Feet supported, Bilateral upper extremity supported Sitting balance-Leahy Scale: Poor Sitting balance - Comments: x3 posterior LOB needing asssit to maintain staic siting                                    Cognition Arousal/Alertness: Awake/alert Behavior During Therapy: [facility]  for tasks assessed/performed, Restless Overall Cognitive Status: Impaired/Different from baseline Area of Impairment: Orientation, Attention, Memory, Following commands, Safety/judgement, Problem solving, Awareness                 Orientation Level: Disoriented to, Situation Current Attention Level: Sustained Memory: Decreased  short-term memory Following Commands: Follows one step commands with increased time, Follows one step commands consistently Safety/Judgement: Decreased awareness of safety, Decreased awareness of deficits Awareness: Intellectual, Emergent Problem Solving: Slow processing, Decreased initiation, Difficulty sequencing, Requires verbal cues, Requires tactile cues General Comments: follows one step directions well, answers majority of orientation questions        Exercises      General Comments General comments (skin integrity, edema, etc.): VSS on RA      Pertinent Vitals/Pain Pain Assessment Pain Assessment: Faces Faces Pain Scale: Hurts even more Pain Location: L hip Pain Descriptors / Indicators: Grimacing, Guarding Pain Intervention(s): Monitored during session, Limited activity within patient's tolerance    Home Living                          Prior Function            PT Goals (current goals can now be found in the care plan section) Acute Rehab PT Goals Patient Stated Goal: feel better PT Goal Formulation: With patient Time For Goal Achievement: 06/11/22 Potential to Achieve Goals: Fair    Frequency    Min 3X/week      PT Plan      Co-evaluation              AM-PAC PT "6 Clicks" Mobility   Outcome Measure  Help needed turning from your back to your side while in a flat bed without using bedrails?: A Little Help needed moving from lying on your back to sitting on the side of a flat bed without using bedrails?: A Little Help needed moving to and from a bed to a chair (including a wheelchair)?: Total Help needed standing up from a chair using your arms (e.g., wheelchair or bedside chair)?: Total Help needed to walk in hospital room?: Total Help needed climbing 3-5 steps with a railing? : Total 6 Click Score: 10    End of Session   Activity Tolerance: Patient tolerated treatment well Patient left: in bed;with call bell/phone within  reach;with bed alarm set Nurse Communication: Mobility status PT Visit Diagnosis: Other abnormalities of gait and mobility (R26.89);Muscle weakness (generalized) (M62.81)     Time: 6767-2094 PT Time Calculation (min) (ACUTE ONLY): 26 min  Charges:  $Therapeutic Activity: 23-37 mins                     Quincie Haroon R. PTA Acute Rehabilitation Services Office: [place] 05/30/2022, 3:48 PM

## 2022-05-30 NOTE — Telephone Encounter (Signed)
11:06am still admitted 05/30/2022

## 2022-05-30 NOTE — Progress Notes (Signed)
PROGRESS NOTE    Calvin [name]  DGL:[id number] DOB: [birth year] DOA: 05/27/2022 PCP: Laurey Morale, MD   Brief Narrative:  Calvin [name] is a 76 y.o. male with past medical history significant for chronic bladder outlet obstruction/BPH with chronic Foley catheter, history of recurrent UTI, nephrolithiasis, HTN, anxiety/depression, EtOH use disorder, left lower extremity stress fracture currently utilizing a cam walker boot who presented via EMS to [facility] ED on 12/10 with confusion, weakness.  Patient lives alone but has a neighbor who has been helping him out more frequently.  Last known normal was night prior to admission.  On EMS arrival, patient was found sitting on the floor near his wheelchair with suspicion that he slid out of the wheelchair with unknown downtime.  He was minimally responsive to first and there was initial concern for hypotension and hypoxia.  Upon EMS standing him up his symptoms somewhat improved and he was transported to the ED for further evaluation.  Patient denied fever, no chills, no chest pain, no abdominal pain, no nausea/vomiting/diarrhea.   In the ED, temperature 97.8 F, HR 90, RR 16, BP 113/76, SpO2 96% on room air.  Sodium 141, potassium 3.4, chloride 106, CO2 23, glucose 107, BUN 9, creatinine 0.79.  Magnesium 1.9, AST 23, ALT 19, total bilirubin 0.6.  Lactic acid 2.1.  WBC 8.3, hemoglobin 13.9, platelets 283.  INR 1.1.  Urinalysis with large leukocytes, positive nitrite, few bacteria, 21-50 WBCs.  UDS negative.  CT head without contrast with low-density right paramedian pons consistent with CVA, atrophy and chronic small vessel ischemic changes of the white matter.  CT angiogram head/neck with no emergent finding, generalized atherosclerosis most notably causing 60% narrowing at the origin of the nondominant left vertebral artery.  MR brain without contrast with acute infarction right paramedial pons, no swelling or hemorrhage, generalized atrophy and moderate chronic  small vessel ischemic changes of the cerebral hemispheric white matter.  Neurology was consulted.  Hospitalist service consulted for evaluation management of acute CVA and UTI.  **Interim History Continues to appear fatigued and lethargic.  PT OT still recommending SNF and he continues to not void properly still had to have his Foley catheter reinserted.  Assessment and Plan:  Acute ischemic CVA -Presenting to the ED via EMS after being found down in his home by his neighbor on the floor with unknown downtime.   -Last known normal was day prior to admission.   -MR brain with acute infarction right paramedial pons.   -CT angiogram head/neck with no emergent finding.  -LDL 92.   -Hemoglobin A1c 5.5.   -TTE with LVEF 60 to 65%, mild concentric LVH, no LV regional wall motion normalities, no aortic stenosis, aortic dilation ascending aorta measuring 41 mm, IVC normal in size.   -UDS Negative  -Was followed by neurology initially and now signed off. -C/w Atorvastatin 40 mg p.o. daily -C/w DAPT with Aspirin 81 mg p.o. daily, Plavix 75 mg p.o. daily x 21 days followed by aspirin alone -Outpatient follow-up with [facility]rology, Dr. Carles Collet on 07/17/2021. -PT OT recommending SNF and TOC assisting with discharge disposition.  Patient continues to acknowledge that he is weak but prefers to return home and will think about SNF option and discussed with caseworker   Urinary tract infection -Complicated by this requirement of chronic Foley catheter use.   -Urinalysis showed a cloudy appearance with moderate hemoglobin, large leukocytes, positive nitrites, few bacteria, 11-20 RBCs per high-power field, 21-50 WBCs -Follows with urology outpatient.   -  Review of previous urine cultures all notable for E. coli and only resistance to Ampicillin. -WBC went from 10.3 -> 15.3 -> 9.3 -Blood cultures x 2: No growth less than 24 hours -Urine culture: Showing Pan-sensitive Proteus 10,000 colony-forming units and is  only resistant to Macrobid -C/w Ceftriaxone 1 g IV every 24 hours   Hypokalemia -Repleted during the hospitalization. -Improved and K+ went from 3.5 -> 3.6 -Continue to Monitor and Trend -Repeat CMP in the AM    History of bladder outlet obstruction/BPH with chronic Foley catheter -Patient with chronic Foley catheter in place on admission, developed obstruction during hospitalization and was exchanged on 05/29/2022 with 18 French catheter. -Continue Foley catheter (Had to be replaced) -C/w Tamsulosin 0.4 mg p.o. daily -C/w Finasteride 5 mg p.o. daily -Continue outpatient follow-up with urology -Continue to monitor urinary output, bladder scan as needed   Essential Hypertension:  -Home medication include Norvasc 5 mg p.o. daily, losartan '100mg'$  p.o. daily. -Was Holding antihypertensives to allow permissive hypertension in the first 24hours following acute CVA up to 220/120. -Continue to monitor BP closely and last BP reading is    Anxiety/Depression: -C/w Zoloft 100 mg p.o. daily   Left lower extremity stress fracture -Patient follows with orthopedics outpatient, currently utilizing a cam walker boot for ambulation. -Weightbearing as tolerates left lower extremity -Continue outpatient follow-up with orthopedics   EtOH use disorder/intoxication/withdrawal -EtOH level elevated to 14 on admission.   -Counseled on need for complete cessation -CIWA protocol  Hyponatremia -Patient's Na+ went from 143 -> 133 -> 134 -Continue to Monitor and Trend -Repeat CMP in the AM  Metabolic Acidosis -Patient's CO2 is now 20, AG is 11, Chloride Level is 103 -Continue to Monitor and Trend -Repeat CMP in the AM    Weakness/debility/deconditioning: -PT/OT recommend SNF placement, TOC for evaluation and discussing Options with Patient     DVT prophylaxis: enoxaparin (LOVENOX) injection 40 mg Start: 05/27/22 2200    Code Status: Full Code Family Communication: No family currently at  bedside  Disposition Plan:  Level of care: Telemetry Medical Status is: Inpatient Remains inpatient appropriate because: Needs further clinical improvement and PT OT recommending SNF and will need to discuss with TOC about discharge disposition this patient remains weak and wants to go home   Consultants:  None  Procedures:  As delineated as above  Antimicrobials:  Anti-infectives (From admission, onward)    Start     Dose/Rate Route Frequency Ordered Stop   05/28/22 0400  cefTRIAXone (ROCEPHIN) 1 g in sodium chloride 0.9 % 100 mL IVPB        1 g 200 mL/hr over 30 Minutes Intravenous Every 24 hours 05/27/22 1827     05/27/22 1715  ceFEPIme (MAXIPIME) 2 g in sodium chloride 0.9 % 100 mL IVPB        2 g 200 mL/hr over 30 Minutes Intravenous  Once 05/27/22 1707 05/27/22 1846       Subjective: And examined at bedside and he was a little bit somnolent.  Answers questions but is very fatigued.  Denies any chest pain or shortness breath.  No nausea or vomiting.  No other concerns or complaints at this time.  Objective: Vitals:   05/29/22 1505 05/29/22 2305 05/30/22 0412 05/30/22 0823  BP: (!) 134/96 (!) 118/93 (!) 137/106 108/63  Pulse: 92 89 100 81  Resp: '17 18 19 18  '$ Temp: (!) 97.5 F (36.4 C) 98.5 F (36.9 C) 98.4 F (36.9 C) (!) 97.4 F (36.3 C)  TempSrc: Axillary Oral Oral Oral  SpO2: 97% 96% 98% 92%    Intake/Output Summary (Last 24 hours) at 05/30/2022 0829 Last data filed at 05/30/2022 0441 Gross per 24 hour  Intake 400 ml  Output 5400 ml  Net -5000 ml   There were no vitals filed for this visit.  Examination: Physical Exam:  Constitutional: WN/WD elderly chronically ill-appearing Caucasian male in no acute distress Respiratory: Diminished to auscultation bilaterally, no wheezing, rales, rhonchi or crackles. Normal respiratory effort and patient is not tachypenic. No accessory muscle use.  Unlabored breathing Cardiovascular: RRR, no murmurs / rubs / gallops.  S1 and S2 auscultated.  Abdomen: Soft, non-tender, non-distended. Bowel sounds positive.  GU: Deferred. Musculoskeletal: No clubbing / cyanosis of digits/nails. No joint deformity upper and lower extremities.  Skin: No rashes, lesions, ulcers on limited skin evaluation. No induration; Warm and dry.  Neurologic: CN 2-12 grossly intact with no focal deficits. Romberg sign and cerebellar reflexes not assessed.  Psychiatric: Normal judgment and insight. Alert and oriented x 3. Normal mood and appropriate affect.   Data Reviewed: I have personally reviewed following labs and imaging studies  CBC: Recent Labs  Lab 05/27/22 1215 05/27/22 1222 05/28/22 0250 05/29/22 0257 05/30/22 0351  WBC 8.3  --  10.3 15.3* 9.3  NEUTROABS 5.4  --   --   --   --   HGB 13.9 13.6 14.4 15.5 14.1  HCT 40.5 40.0 40.9 42.8 39.5  MCV 94.4  --  92.5 88.8 91.0  PLT 283  --  279 331 423   Basic Metabolic Panel: Recent Labs  Lab 05/27/22 1215 05/27/22 1222 05/29/22 0257 05/30/22 0351  NA 141 143 133* 134*  K 3.4* 3.5 3.5 3.6  CL 106 105 95* 103  CO2 23  --  23 20*  GLUCOSE 107* 105* 154* 100*  BUN '9 9 17 17  '$ CREATININE 0.79 1.00 0.86 0.74  CALCIUM 9.0  --  9.6 9.0  MG 1.9  --  1.8 2.0  PHOS  --   --   --  3.5   GFR: CrCl cannot be calculated (Unknown ideal weight.). Liver Function Tests: Recent Labs  Lab 05/27/22 1215  AST 23  ALT 19  ALKPHOS 88  BILITOT 0.6  PROT 7.1  ALBUMIN 3.6   No results for input(s): "LIPASE", "AMYLASE" in the last 168 hours. No results for input(s): "AMMONIA" in the last 168 hours. Coagulation Profile: Recent Labs  Lab 05/27/22 1215  INR 1.1   Cardiac Enzymes: Recent Labs  Lab 05/27/22 1215  CKTOTAL 49   BNP (last 3 results) No results for input(s): "PROBNP" in the last 8760 hours. HbA1C: Recent Labs    05/28/22 0250  HGBA1C 5.5   CBG: No results for input(s): "GLUCAP" in the last 168 hours. Lipid Profile: Recent Labs    05/28/22 0250  CHOL  173  HDL 59  LDLCALC 92  TRIG 111  CHOLHDL 2.9   Thyroid Function Tests: No results for input(s): "TSH", "T4TOTAL", "FREET4", "T3FREE", "THYROIDAB" in the last 72 hours. Anemia Panel: No results for input(s): "VITAMINB12", "FOLATE", "FERRITIN", "TIBC", "IRON", "RETICCTPCT" in the last 72 hours. Sepsis Labs: Recent Labs  Lab 05/27/22 1215 05/27/22 1535  LATICACIDVEN 2.1* 2.1*    Recent Results (from the past 240 hour(s))  Blood culture (routine x 2)     Status: None (Preliminary result)   Collection Time: 05/27/22 12:15 PM   Specimen: BLOOD  Result Value Ref Range Status   Specimen Description  BLOOD SITE NOT SPECIFIED  Final   Special Requests   Final    BOTTLES DRAWN AEROBIC AND ANAEROBIC Blood Culture adequate volume   Culture   Final    NO GROWTH 2 DAYS Performed at [facility], [address], [place], [place] [address]    Report Status PENDING  Incomplete  Blood culture (routine x 2)     Status: None (Preliminary result)   Collection Time: 05/27/22 12:15 PM   Specimen: BLOOD  Result Value Ref Range Status   Specimen Description BLOOD SITE NOT SPECIFIED  Final   Special Requests   Final    BOTTLES DRAWN AEROBIC AND ANAEROBIC Blood Culture adequate volume   Culture   Final    NO GROWTH 2 DAYS Performed at [facility], [address] [place], [place] [address]    Report Status PENDING  Incomplete  Culture, Urine (Do not remove urinary catheter, catheter placed by urology or difficult to place)     Status: Abnormal   Collection Time: 05/28/22  7:41 AM   Specimen: Urine, Catheterized  Result Value Ref Range Status   Specimen Description URINE, CATHETERIZED  Final   Special Requests   Final    NONE Performed at [facility], [place] [address] [place], [place] [address]    Culture 10,000 COLONIES/mL PROTEUS MIRABILIS (A)  Final   Report Status 05/30/2022 FINAL  Final   Organism ID, Bacteria PROTEUS MIRABILIS (A)  Final      Susceptibility    Proteus mirabilis - MIC*    AMPICILLIN <=2 SENSITIVE Sensitive     CEFAZOLIN <=4 SENSITIVE Sensitive     CEFEPIME <=0.12 SENSITIVE Sensitive     CEFTRIAXONE <=0.25 SENSITIVE Sensitive     CIPROFLOXACIN <=0.25 SENSITIVE Sensitive     GENTAMICIN <=1 SENSITIVE Sensitive     IMIPENEM 2 SENSITIVE Sensitive     NITROFURANTOIN 128 RESISTANT Resistant     TRIMETH/SULFA <=20 SENSITIVE Sensitive     AMPICILLIN/SULBACTAM <=2 SENSITIVE Sensitive     PIP/TAZO <=4 SENSITIVE Sensitive     * 10,000 COLONIES/mL PROTEUS MIRABILIS    Radiology Studies: ECHOCARDIOGRAM COMPLETE  Result Date: 05/28/2022    ECHOCARDIOGRAM REPORT   Patient Name:   JAMONI HEWES Date of Exam: 05/28/2022 Medical Rec #:  [id number]      Height:       72.0 in Accession #:    [id number]     Weight:       220.0 lb Date of Birth:  [birth year]      BSA:          2.219 m Patient Age:    69 years       BP:           143/103 mmHg Patient Gender: M              HR:           107 bpm. Exam Location:  Inpatient Procedure: 2D Echo Indications:    stroke  History:        Patient has prior history of Echocardiogram examinations, most                 recent 07/20/2021. Risk Factors:Hypertension.  Sonographer:    Johny Chess RDCS Referring Phys: [id number] Candace Gallus MELVIN  Sonographer Comments: Technically difficult study due to poor echo windows. Image acquisition challenging due to respiratory motion. IMPRESSIONS  1. Left ventricular ejection fraction, by estimation, is 60  to 65%. The left ventricle has normal function. The left ventricle has no regional wall motion abnormalities. There is mild concentric left ventricular hypertrophy. Left ventricular diastolic function could not be evaluated.  2. Right ventricular systolic function is normal. The right ventricular size is normal.  3. The mitral valve is grossly normal. No evidence of mitral valve regurgitation. No evidence of mitral stenosis.  4. The aortic valve was not well visualized. Aortic valve  regurgitation is not visualized. No aortic stenosis is present.  5. Aortic dilatation noted. There is mild dilatation of the ascending aorta, measuring 41 mm.  6. The inferior vena cava is normal in size with greater than 50% respiratory variability, suggesting right atrial pressure of 3 mmHg. Comparison(s): No significant change from prior study. Conclusion(s)/Recommendation(s): No intracardiac source of embolism detected on this transthoracic study. Consider a transesophageal echocardiogram to exclude cardiac source of embolism if clinically indicated. Cannot exclude atrial fibrillation based on  tracings--recommend confirming on telemetry/ECG. FINDINGS  Left Ventricle: Left ventricular ejection fraction, by estimation, is 60 to 65%. The left ventricle has normal function. The left ventricle has no regional wall motion abnormalities. The left ventricular internal cavity size was normal in size. There is  mild concentric left ventricular hypertrophy. Left ventricular diastolic function could not be evaluated. Right Ventricle: The right ventricular size is normal. No increase in right ventricular wall thickness. Right ventricular systolic function is normal. Left Atrium: Left atrial size was normal in size. Right Atrium: Right atrial size was normal in size. Pericardium: There is no evidence of pericardial effusion. Mitral Valve: The mitral valve is grossly normal. No evidence of mitral valve regurgitation. No evidence of mitral valve stenosis. Tricuspid Valve: The tricuspid valve is grossly normal. Tricuspid valve regurgitation is not demonstrated. No evidence of tricuspid stenosis. Aortic Valve: The aortic valve was not well visualized. Aortic valve regurgitation is not visualized. No aortic stenosis is present. Pulmonic Valve: The pulmonic valve was not well visualized. Pulmonic valve regurgitation is not visualized. Aorta: Aortic dilatation noted. There is mild dilatation of the ascending aorta, measuring 41 mm.  Venous: The inferior vena cava is normal in size with greater than 50% respiratory variability, suggesting right atrial pressure of 3 mmHg. IAS/Shunts: The interatrial septum was not well visualized. Additional Comments: There is a small pleural effusion in the left lateral region.  LEFT VENTRICLE PLAX 2D LVIDd:         4.40 cm LVIDs:         2.90 cm LV PW:         1.20 cm LV IVS:        1.30 cm LVOT diam:     2.30 cm LV SV:         66 LV SV Index:   30 LVOT Area:     4.15 cm  IVC IVC diam: 1.80 cm LEFT ATRIUM             Index        RIGHT ATRIUM           Index LA diam:        3.90 cm 1.76 cm/m   RA Area:     14.50 cm LA Vol (A2C):   53.8 ml 24.25 ml/m  RA Volume:   29.70 ml  13.39 ml/m LA Vol (A4C):   42.7 ml 19.25 ml/m LA Biplane Vol: 47.9 ml 21.59 ml/m  AORTIC VALVE LVOT Vmax:   77.10 cm/s LVOT Vmean:  51.400 cm/s LVOT VTI:  0.160 m  AORTA Ao Root diam: 3.50 cm Ao Asc diam:  4.10 cm  SHUNTS Systemic VTI:  0.16 m Systemic Diam: 2.30 cm Buford Dresser MD Electronically signed by Buford Dresser MD Signature Date/Time: 05/28/2022/2:13:05 PM    Final     Scheduled Meds:  aspirin EC  81 mg Oral Daily   atorvastatin  40 mg Oral Daily   Chlorhexidine Gluconate Cloth  6 each Topical Daily   clopidogrel  75 mg Oral Daily   enoxaparin (LOVENOX) injection  40 mg Subcutaneous Q24H   finasteride  5 mg Oral Daily   folic acid  1 mg Oral Daily   multivitamin with minerals  1 tablet Oral Daily   sertraline  100 mg Oral Daily   tamsulosin  0.4 mg Oral Daily   thiamine  100 mg Oral Daily   Or   thiamine  100 mg Intravenous Daily   Continuous Infusions:  sodium chloride 100 mL/hr at 05/29/22 2046   cefTRIAXone (ROCEPHIN)  IV 1 g (05/30/22 0422)   lactated ringers 100 mL/hr at 05/29/22 0414    LOS: 1 day   Raiford Noble, DO [facility] Available via Epic secure chat 7am-7pm After these hours, please refer to coverage provider listed on [email or web address] 05/30/2022, 8:29 AM

## 2022-05-30 NOTE — TOC Progression Note (Addendum)
Transition of Care [facility]) - Progression Note    Patient Details  Name: Calvin [name] [name]: [id number] Date of Birth: 1945/10/07  Transition of Care [facility]) CM/SW Contact  Jinger Neighbors, [place] Phone Number: 05/30/2022, 11:14 AM  Clinical Narrative:     CSW met with patient at bedside. Pt appears sleepy, but AAOx3. Pt reports he is hard of hearing and wears hearing aids, but unsure as to where they are currently. Pt states he currently has an "in home, live in" caregiver and really does not want to go to SNF. [facility] and he reports he did not benefit from the PT. Pt acknowledges he is really weak, but prefers to return home. CSW will further discuss the recommendation of SNF and the option of another SNF vs. Returning to [facility]. CSW assessed pt's current EtOH use. Pt reports he quit drinking about 4 moths ago. CSW discussed the alcohol in pt's system at admission. He further explained he quit drinking 4 months ago, but recently started back drinking. He decided to quit drinking four months ago due to his PCP's recommendations. Pt reports he's been drinking since the age of 77 and denies wanting any SU linkage to a therapist or any community resources; however, CSW will still provide a list of resources. 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 CSW informed pt CSW will let him think about the recommendations and will follow back up.  CSW provided pt with SU resources.   Expected Discharge Plan: [place] Barriers to Discharge: Continued Medical Work up  Expected Discharge Plan and Services Expected Discharge Plan: [place]                                               Social Determinants of Health (SDOH) Interventions    Readmission Risk Interventions    03/19/2022    2:17 PM 02/27/2022    2:30 PM   Readmission Risk Prevention Plan  Post Dischage Appt Complete Complete  Medication Screening Complete Complete  Transportation Screening Complete Complete

## 2022-05-30 NOTE — Plan of Care (Signed)
  Problem: Ischemic Stroke/TIA Tissue Perfusion: Goal: Complications of ischemic stroke/TIA will be minimized Outcome: Progressing   Problem: Coping: Goal: Will identify appropriate support needs Outcome: Progressing   Problem: Health Behavior/Discharge Planning: Goal: Goals will be collaboratively established with patient/family Outcome: Progressing

## 2022-05-31 DIAGNOSIS — I639 Cerebral infarction, unspecified: Secondary | ICD-10-CM | POA: Diagnosis not present

## 2022-05-31 DIAGNOSIS — N390 Urinary tract infection, site not specified: Secondary | ICD-10-CM | POA: Diagnosis not present

## 2022-05-31 LAB — CBC WITH DIFFERENTIAL/PLATELET
Abs Immature Granulocytes: 0.04 10*3/uL (ref 0.00–0.07)
Basophils Absolute: 0 10*3/uL (ref 0.0–0.1)
Basophils Relative: 0 %
Eosinophils Absolute: 0.2 10*3/uL (ref 0.0–0.5)
Eosinophils Relative: 2 %
HCT: 38.2 % — ABNORMAL LOW (ref 39.0–52.0)
Hemoglobin: 13.1 g/dL (ref 13.0–17.0)
Immature Granulocytes: 1 %
Lymphocytes Relative: 20 %
Lymphs Abs: 1.6 10*3/uL (ref 0.7–4.0)
MCH: 32.2 pg (ref 26.0–34.0)
MCHC: 34.3 g/dL (ref 30.0–36.0)
MCV: 93.9 fL (ref 80.0–100.0)
Monocytes Absolute: 0.8 10*3/uL (ref 0.1–1.0)
Monocytes Relative: 10 %
Neutro Abs: 5.3 10*3/uL (ref 1.7–7.7)
Neutrophils Relative %: 67 %
Platelets: 239 10*3/uL (ref 150–400)
RBC: 4.07 MIL/uL — ABNORMAL LOW (ref 4.22–5.81)
RDW: 14.2 % (ref 11.5–15.5)
WBC: 7.9 10*3/uL (ref 4.0–10.5)
nRBC: 0 % (ref 0.0–0.2)

## 2022-05-31 LAB — COMPREHENSIVE METABOLIC PANEL
ALT: 19 U/L (ref 0–44)
AST: 27 U/L (ref 15–41)
Albumin: 3.1 g/dL — ABNORMAL LOW (ref 3.5–5.0)
Alkaline Phosphatase: 67 U/L (ref 38–126)
Anion gap: 10 (ref 5–15)
BUN: 16 mg/dL (ref 8–23)
CO2: 21 mmol/L — ABNORMAL LOW (ref 22–32)
Calcium: 8.7 mg/dL — ABNORMAL LOW (ref 8.9–10.3)
Chloride: 107 mmol/L (ref 98–111)
Creatinine, Ser: 0.79 mg/dL (ref 0.61–1.24)
GFR, Estimated: >60 mL/min (ref >60)
Glucose, Bld: 93 mg/dL (ref 70–99)
Potassium: 3.5 mmol/L (ref 3.5–5.1)
Sodium: 138 mmol/L (ref 135–145)
Total Bilirubin: 0.7 mg/dL (ref 0.3–1.2)
Total Protein: 6.3 g/dL — ABNORMAL LOW (ref 6.5–8.1)

## 2022-05-31 LAB — GASTROINTESTINAL PANEL BY PCR, STOOL (REPLACES STOOL CULTURE)

## 2022-05-31 LAB — URINE CULTURE: Culture: >=100000 — AB

## 2022-05-31 LAB — PHOSPHORUS: Phosphorus: 3.4 mg/dL (ref 2.5–4.6)

## 2022-05-31 LAB — MAGNESIUM: Magnesium: 1.8 mg/dL (ref 1.7–2.4)

## 2022-05-31 MED ORDER — POTASSIUM CHLORIDE CRYS ER 20 MEQ PO TBCR
40.0000 meq | EXTENDED_RELEASE_TABLET | Freq: Two times a day (BID) | ORAL | Status: AC
  Administered 2022-05-31 (×2): 40 meq via ORAL
  Filled 2022-05-31 (×2): qty 2

## 2022-05-31 MED ORDER — CEPHALEXIN 500 MG PO CAPS
500.0000 mg | ORAL_CAPSULE | Freq: Two times a day (BID) | ORAL | Status: AC
  Administered 2022-05-31 – 2022-06-03 (×8): 500 mg via ORAL
  Filled 2022-05-31 (×8): qty 1

## 2022-05-31 MED ORDER — CLOPIDOGREL BISULFATE 75 MG PO TABS: 75.0000 mg | ORAL_TABLET | Freq: Every day | ORAL | 0 refills | Status: AC

## 2022-05-31 MED ORDER — ADULT MULTIVITAMIN W/MINERALS CH: 1.0000 | ORAL_TABLET | Freq: Every day | ORAL | 0 refills | Status: AC

## 2022-05-31 MED ORDER — FOLIC ACID 1 MG PO TABS: 1.0000 mg | ORAL_TABLET | Freq: Every day | ORAL | 0 refills | Status: DC

## 2022-05-31 MED ORDER — VITAMIN B-1 100 MG PO TABS: 100.0000 mg | ORAL_TABLET | Freq: Every day | ORAL | 0 refills | Status: DC

## 2022-05-31 MED ORDER — ATORVASTATIN CALCIUM 40 MG PO TABS: 40.0000 mg | ORAL_TABLET | Freq: Every day | ORAL | 0 refills | Status: DC

## 2022-05-31 MED ORDER — MAGNESIUM SULFATE 2 GM/50ML IV SOLN
2.0000 g | Freq: Once | INTRAVENOUS | Status: AC
  Administered 2022-05-31: 2 g via INTRAVENOUS
  Filled 2022-05-31: qty 50

## 2022-05-31 MED ORDER — ASPIRIN 81 MG PO TBEC: 81.0000 mg | DELAYED_RELEASE_TABLET | Freq: Every day | ORAL | 12 refills | Status: DC

## 2022-05-31 MED ORDER — CEPHALEXIN 500 MG PO CAPS: 500.0000 mg | ORAL_CAPSULE | Freq: Two times a day (BID) | ORAL | 0 refills | Status: AC

## 2022-05-31 NOTE — Discharge Summary (Signed)
Physician Discharge Summary   Patient: Calvin [name] [name]: [id number] DOB: December 03, 1945  Admit date:     05/27/2022  Discharge date: 05/31/22  Discharge Physician: Raiford Noble, DO   PCP: Laurey Morale, MD   Recommendations at discharge:   Follow up with PCP within 1-2 weeks and repeat CBC,CMP, Mag, Phos within 1 week Follow up with Neurology in the outpatient setting within 6 weeks; Appointment Scheduled with Dr. Carles Collet on 07/17/21 Follow-up with orthopedic surgery in outpatient setting within 1 to 2 weeks Follow-up with urology in outpatient setting  Discharge Diagnoses: Principal Problem:   Acute CVA (cerebrovascular accident) [facility]) Active Problems:   Essential hypertension   Enlarged prostate   Depression with anxiety   Bladder outflow obstruction   UTI (urinary tract infection)   Indwelling Foley catheter present   CVA (cerebral vascular accident) ([place])   Pressure injury of skin  Resolved Problems:   * No resolved hospital problems. [facility] Course: Calvin [name] is a 76 y.o. male with past medical history significant for chronic bladder outlet obstruction/BPH with chronic Foley catheter, history of recurrent UTI, nephrolithiasis, HTN, anxiety/depression, EtOH use disorder, left lower extremity stress fracture currently utilizing a cam walker boot who presented via EMS to [facility] ED on 12/10 with confusion, weakness.  Patient lives alone but has a neighbor who has been helping him out more frequently.  Last known normal was night prior to admission.  On EMS arrival, patient was found sitting on the floor near his wheelchair with suspicion that he slid out of the wheelchair with unknown downtime.  He was minimally responsive to first and there was initial concern for hypotension and hypoxia.  Upon EMS standing him up his symptoms somewhat improved and he was transported to the ED for further evaluation.  Patient denied fever, no chills, no chest pain, no abdominal pain, no  nausea/vomiting/diarrhea.   In the ED, temperature 97.8 F, HR 90, RR 16, BP 113/76, SpO2 96% on room air.  Sodium 141, potassium 3.4, chloride 106, CO2 23, glucose 107, BUN 9, creatinine 0.79.  Magnesium 1.9, AST 23, ALT 19, total bilirubin 0.6.  Lactic acid 2.1.  WBC 8.3, hemoglobin 13.9, platelets 283.  INR 1.1.  Urinalysis with large leukocytes, positive nitrite, few bacteria, 21-50 WBCs.  UDS negative.  CT head without contrast with low-density right paramedian pons consistent with CVA, atrophy and chronic small vessel ischemic changes of the white matter.  CT angiogram head/neck with no emergent finding, generalized atherosclerosis most notably causing 60% narrowing at the origin of the nondominant left vertebral artery.  MR brain without contrast with acute infarction right paramedial pons, no swelling or hemorrhage, generalized atrophy and moderate chronic small vessel ischemic changes of the cerebral hemispheric white matter.  Neurology was consulted.  Hospitalist service consulted for evaluation management of acute CVA and UTI.   **Interim History Continues to appear fatigued and lethargic.  PT OT still recommending SNF and he continues to not void properly still had to have his Foley catheter reinserted. He is improving clinically along with his labs and is medically stable to D/C to SNF when bed is available.     Assessment and Plan:  Acute Ischemic CVA -Presenting to the ED via EMS after being found down in his home by his neighbor on the floor with unknown downtime.   -Last known normal was day prior to admission.   -MR brain with acute infarction right paramedial pons.   -CT angiogram head/neck with  no emergent finding.  -LDL 92.   -Hemoglobin A1c 5.5.   -TTE with LVEF 60 to 65%, mild concentric LVH, no LV regional wall motion normalities, no aortic stenosis, aortic dilation ascending aorta measuring 41 mm, IVC normal in size.   -UDS Negative  -Was followed by neurology initially  and now signed off. -C/w Atorvastatin 40 mg p.o. daily given that LDL is not at goal -C/w DAPT with Aspirin 81 mg p.o. daily, Plavix 75 mg p.o. daily x 21 days followed by aspirin alone indefinitely  -Outpatient follow-up with [facility], Dr. Carles Collet on 07/17/2021. -PT OT recommending SNF and TOC assisting with discharge disposition and he is agreeable to SNF    Proteus Urinary tract infection -Complicated by this requirement of chronic Foley catheter use.   -Urinalysis showed a cloudy appearance with moderate hemoglobin, large leukocytes, positive nitrites, few bacteria, 11-20 RBCs per high-power field, 21-50 WBCs -Follows with urology outpatient.   -Review of previous urine cultures all notable for E. coli and only resistance to Ampicillin. -WBC went from 10.3 -> 15.3 -> 9.3 -> 7.9 -Blood cultures x 2: No growth to Date at 4 days  -Urine culture: Showing Pan-sensitive Proteus 10,000 colony-forming units and is only resistant to Macrobid; Prior Urine Cx showed >100,000 CFU of GNR that was also Proteus  -C/w Ceftriaxone 1 g IV every 24 hours while hospitalized and change po closer to D/C to complete course    Hypokalemia -Repleted during the hospitalization. -Improved and K+ went from 3.5 -> 3.6 -> 3.5 -Continue to Monitor and Trend -Repeat CMP in the AM    History of bladder outlet obstruction/BPH with chronic Foley catheter -Patient with chronic Foley catheter in place on admission, developed obstruction during hospitalization and was exchanged on 05/29/2022 with 18 French catheter. -Continue Foley catheter (Had to be replaced) -C/w Tamsulosin 0.4 mg p.o. daily -C/w Finasteride 5 mg p.o. daily -Continue outpatient follow-up with urology -Continue to monitor urinary output, bladder scan as needed   Essential Hypertension:  -Home medication include Norvasc 5 mg p.o. daily, losartan '100mg'$  p.o. daily. -Was Holding antihypertensives to allow permissive hypertension in the first 24hours  following acute CVA up to 220/120. -Continue to monitor BP closely and last BP reading is 141/110   Anxiety/Depression: -C/w Zoloft 100 mg p.o. daily   Left lower extremity stress fracture -Patient follows with orthopedics outpatient, currently utilizing a cam walker boot for ambulation. -Weightbearing as tolerates left lower extremity -Continue outpatient follow-up with orthopedics   EtOH use disorder/intoxication/withdrawal -EtOH level elevated to 14 on admission.   -Counseled on need for complete cessation -CIWA protocol   Hyponatremia -Patient's Na+ went from 143 -> 133 -> 134 -> 138 -Continue to Monitor and Trend -Repeat CMP within 1 week    Metabolic Acidosis -Patient's CO2 is now 21, AG is 10, Chloride Level is 107 -Continue to Monitor and Trend -Repeat CMP within 1 week   Hypoalbuminemia -Patient's Albumin Level is now 3.1 -Continue to Monitor and Trend -Repeat CMP within 1 week   Diarrhea/Loose Stools -Unlikely C Diff so cancelled Testing -GI Pathogen panel pending    Weakness/debility/deconditioning: -PT/OT recommend SNF placement, TOC for evaluation and discussing Options with Patient   Pressure Ulcer, poA Active Pressure Injury/Wound(s)     Pressure Ulcer  Duration          Pressure Injury 05/28/22 Coccyx Medial Stage 1 -  Intact skin with non-blanchable redness of a localized area usually over a bony prominence. 2 days  Consultants: Neurology  Procedures performed: As above  Disposition: Skilled nursing facility Diet recommendation:  Cardiac diet DISCHARGE MEDICATION: Allergies as of 05/31/2022   No Known Allergies      Medication List     STOP taking these medications    diazepam 5 MG tablet Commonly known as: VALIUM       TAKE these medications    amLODipine 5 MG tablet Commonly known as: NORVASC TAKE 1 TABLET (5 MG TOTAL) BY MOUTH DAILY.   aspirin EC 81 MG tablet Take 1 tablet (81 mg total) by mouth daily.  Swallow whole. Start taking on: June 01, 2022   atorvastatin 40 MG tablet Commonly known as: LIPITOR Take 1 tablet (40 mg total) by mouth daily. Start taking on: June 01, 2022 What changed:  medication strength how much to take   BD Eclipse Syringe/Needle 25G X 5/8" 3 ML Misc Generic drug: SYRINGE-NEEDLE (DISP) 3 ML Use as directed once a week   clopidogrel 75 MG tablet Commonly known as: PLAVIX Take 1 tablet (75 mg total) by mouth daily for 21 days. Start taking on: June 01, 2022   cyanocobalamin 1000 MCG/ML injection Commonly known as: VITAMIN B12 INJECT 1 ML (1,000 MCG TOTAL) INTO THE MUSCLE ONCE A WEEK.   cyclobenzaprine 10 MG tablet Commonly known as: FLEXERIL Take 10 mg by mouth 3 (three) times daily as needed for muscle spasms.   finasteride 5 MG tablet Commonly known as: PROSCAR Take 1 tablet (5 mg total) by mouth daily.   folic acid 1 MG tablet Commonly known as: FOLVITE Take 1 tablet (1 mg total) by mouth daily. Start taking on: June 01, 2022   Klor-Con M10 10 MEQ tablet Generic drug: potassium chloride Take 10 mEq by mouth 2 (two) times daily.   loperamide 2 MG capsule Commonly known as: IMODIUM Take 1 capsule (2 mg total) by mouth every 8 (eight) hours as needed for diarrhea or loose stools.   losartan 100 MG tablet Commonly known as: COZAAR TAKE 1 TABLET BY MOUTH EVERY DAY   multivitamin with minerals Tabs tablet Take 1 tablet by mouth daily. Start taking on: June 01, 2022   ondansetron 4 MG tablet Commonly known as: ZOFRAN Take 4 mg by mouth every 8 (eight) hours as needed for nausea or vomiting.   sertraline 100 MG tablet Commonly known as: ZOLOFT TAKE 1 TABLET BY MOUTH EVERY DAY   tamsulosin 0.4 MG Caps capsule Commonly known as: FLOMAX TAKE 1 CAPSULE BY MOUTH EVERY DAY   thiamine 100 MG tablet Commonly known as: Vitamin B-1 Take 1 tablet (100 mg total) by mouth daily. Start taking on: June 01, 2022                Discharge Care Instructions  (From admission, onward)           Start     Ordered   05/31/22 0000  Discharge wound care:       Comments: Pressure Injury 05/28/22 Coccyx Medial Stage 1 -  Intact skin with non-blanchable redness of a localized area usually over a bony prominence.   05/31/22 1303            Follow-up Information     Tat, Eustace Quail, DO. Go on 07/17/2022.   Specialty: Neurology Contact information: [place] [address] [phone]                Discharge Exam: There were no vitals filed for  this visit. Vitals:   05/31/22 0744 05/31/22 1123  BP: (!) 146/108 (!) 141/110  Pulse: 86 87  Resp: 17 16  Temp: 98.6 F (37 C) 98.6 F (37 C)  SpO2:     Examination: Physical Exam:  Constitutional: WN/WD chronically ill appearing Caucasian male in NAD  Respiratory: Diminished to auscultation bilaterally, no wheezing, rales, rhonchi or crackles. Normal respiratory effort and patient is not tachypenic. No accessory muscle use. Unlabored breathing  Cardiovascular: RRR, no murmurs / rubs / gallops. S1 and S2 auscultated. No extremity edema. Abdomen: Soft, non-tender, non-distended. Bowel sounds positive.  GU: Deferred. Foley in place Musculoskeletal: No clubbing / cyanosis of digits/nails. No joint deformity upper and lower extremities.  Skin: No rashes, lesions, ulcers on a limited skin evaluation. No induration; Warm and dry.  Neurologic: CN 2-12 grossly intact with no focal deficits. Romberg sign and cerebellar reflexes not assessed.  Psychiatric: Normal judgment and insight. Alert and oriented x 3. Normal mood and appropriate affect.   Condition at discharge: stable  The results of significant diagnostics from this hospitalization (including imaging, microbiology, ancillary and laboratory) are listed below for reference.   Imaging Studies: ECHOCARDIOGRAM COMPLETE  Result Date: 05/28/2022     ECHOCARDIOGRAM REPORT   Patient Name:   Calvin [name] Date of Exam: 05/28/2022 Medical Rec #:  [id number]      Height:       72.0 in Accession #:    [id number]     Weight:       220.0 lb Date of Birth:  [birth year]      BSA:          2.219 m Patient Age:    23 years       BP:           143/103 mmHg Patient Gender: M              HR:           107 bpm. Exam Location:  Inpatient Procedure: 2D Echo Indications:    stroke  History:        Patient has prior history of Echocardiogram examinations, most                 recent 07/20/2021. Risk Factors:Hypertension.  Sonographer:    Johny Chess RDCS Referring Phys: [id number] Candace Gallus MELVIN  Sonographer Comments: Technically difficult study due to poor echo windows. Image acquisition challenging due to respiratory motion. IMPRESSIONS  1. Left ventricular ejection fraction, by estimation, is 60 to 65%. The left ventricle has normal function. The left ventricle has no regional wall motion abnormalities. There is mild concentric left ventricular hypertrophy. Left ventricular diastolic function could not be evaluated.  2. Right ventricular systolic function is normal. The right ventricular size is normal.  3. The mitral valve is grossly normal. No evidence of mitral valve regurgitation. No evidence of mitral stenosis.  4. The aortic valve was not well visualized. Aortic valve regurgitation is not visualized. No aortic stenosis is present.  5. Aortic dilatation noted. There is mild dilatation of the ascending aorta, measuring 41 mm.  6. The inferior vena cava is normal in size with greater than 50% respiratory variability, suggesting right atrial pressure of 3 mmHg. Comparison(s): No significant change from prior study. Conclusion(s)/Recommendation(s): No intracardiac source of embolism detected on this transthoracic study. Consider a transesophageal echocardiogram to exclude cardiac source of embolism if clinically indicated. Cannot exclude atrial fibrillation based on   tracings--recommend confirming on telemetry/ECG. FINDINGS  Left Ventricle: Left ventricular ejection fraction, by estimation, is 60 to 65%. The left ventricle has normal function. The left ventricle has no regional wall motion abnormalities. The left ventricular internal cavity size was normal in size. There is  mild concentric left ventricular hypertrophy. Left ventricular diastolic function could not be evaluated. Right Ventricle: The right ventricular size is normal. No increase in right ventricular wall thickness. Right ventricular systolic function is normal. Left Atrium: Left atrial size was normal in size. Right Atrium: Right atrial size was normal in size. Pericardium: There is no evidence of pericardial effusion. Mitral Valve: The mitral valve is grossly normal. No evidence of mitral valve regurgitation. No evidence of mitral valve stenosis. Tricuspid Valve: The tricuspid valve is grossly normal. Tricuspid valve regurgitation is not demonstrated. No evidence of tricuspid stenosis. Aortic Valve: The aortic valve was not well visualized. Aortic valve regurgitation is not visualized. No aortic stenosis is present. Pulmonic Valve: The pulmonic valve was not well visualized. Pulmonic valve regurgitation is not visualized. Aorta: Aortic dilatation noted. There is mild dilatation of the ascending aorta, measuring 41 mm. Venous: The inferior vena cava is normal in size with greater than 50% respiratory variability, suggesting right atrial pressure of 3 mmHg. IAS/Shunts: The interatrial septum was not well visualized. Additional Comments: There is a small pleural effusion in the left lateral region.  LEFT VENTRICLE PLAX 2D LVIDd:         4.40 cm LVIDs:         2.90 cm LV PW:         1.20 cm LV IVS:        1.30 cm LVOT diam:     2.30 cm LV SV:         66 LV SV Index:   30 LVOT Area:     4.15 cm  IVC IVC diam: 1.80 cm LEFT ATRIUM             Index        RIGHT ATRIUM           Index LA diam:        3.90 cm 1.76 cm/m    RA Area:     14.50 cm LA Vol (A2C):   53.8 ml 24.25 ml/m  RA Volume:   29.70 ml  13.39 ml/m LA Vol (A4C):   42.7 ml 19.25 ml/m LA Biplane Vol: 47.9 ml 21.59 ml/m  AORTIC VALVE LVOT Vmax:   77.10 cm/s LVOT Vmean:  51.400 cm/s LVOT VTI:    0.160 m  AORTA Ao Root diam: 3.50 cm Ao Asc diam:  4.10 cm  SHUNTS Systemic VTI:  0.16 m Systemic Diam: 2.30 cm Buford Dresser MD Electronically signed by Buford Dresser MD Signature Date/Time: 05/28/2022/2:13:05 PM    Final    CT ANGIO HEAD NECK W WO CM  Result Date: 05/28/2022 CLINICAL DATA:  Stroke workup EXAM: CT ANGIOGRAPHY HEAD AND NECK TECHNIQUE: Multidetector CT imaging of the head and neck was performed using the standard protocol during bolus administration of intravenous contrast. Multiplanar CT image reconstructions and MIPs were obtained to evaluate the vascular anatomy. Carotid stenosis measurements (when applicable) are obtained utilizing NASCET criteria, using the distal internal carotid diameter as the denominator. RADIATION DOSE REDUCTION: This exam was performed according to the departmental dose-optimization program which includes automated exposure control, adjustment of the mA and/or kV according to patient size and/or use of iterative reconstruction technique. CONTRAST:  86m OMNIPAQUE IOHEXOL 350 MG/ML SOLN COMPARISON:  Brain  MRI from yesterday FINDINGS: CT HEAD FINDINGS Brain: Known right pontine stroke. Generalized brain atrophy with ventriculomegaly. Mild chronic small vessel ischemia in the deep white matter. No hemorrhage, mass, or collection. Vascular: No acute finding Skull: No acute finding Sinuses/Orbits: Mucosal thickening in the left sphenoid sinus Review of the MIP images confirms the above findings CTA NECK FINDINGS Aortic arch: Mild atheromatous plaque Right carotid system: Atheromatous wall thickening of the common carotid with heavily calcified plaque at the bifurcation. No ulceration or significant stenosis Left  carotid system: Mild calcified plaque at the bifurcation without stenosis or ulceration Vertebral arteries: No proximal subclavian stenosis. Dominant right vertebral artery. Left vertebral origin plaque causes 60% narrowing based on coronal reformats. The left vertebral artery ends in the PICA Skeleton: Ordinary cervical spine degeneration. Other neck: No acute finding Upper chest: Clear apical lungs Review of the MIP images confirms the above findings CTA HEAD FINDINGS Anterior circulation: Atheromatous calcification of the carotid siphons. Intracranial branch atheromatous irregularity that is mild. No flow reducing stenosis. No branch occlusion, beading, or aneurysm Posterior circulation: Atheromatous calcification of the right vertebral artery and basilar without flow reducing stenosis. The left vertebral artery terminates in the left PICA. No branch occlusion, beading, or aneurysm Venous sinuses: Unremarkable for the arterial phase Anatomic variants: None significant Review of the MIP images confirms the above findings IMPRESSION: 1. No emergent finding. 2. Generalized atherosclerosis most notably causing 60% narrowing at the origin of the non dominant left vertebral artery. 3. No irregularity or significant stenosis at the level of the basilar. Electronically Signed   By: Jorje Guild M.D.   On: 05/28/2022 05:57   MR BRAIN WO CONTRAST  Result Date: 05/27/2022 CLINICAL DATA:  Neuro deficit, acute, stroke suspected. Fell from wheelchair. Slurred speech and left-sided weakness. EXAM: MRI HEAD WITHOUT CONTRAST TECHNIQUE: Multiplanar, multiecho pulse sequences of the brain and surrounding structures were obtained without intravenous contrast. COMPARISON:  Head CT earlier same day FINDINGS: Brain: Diffusion imaging confirms the presence of an acute stroke in the right para median pons. No gross swelling or hemorrhage. No other acute infarction. No focal cerebellar insult. Cerebral hemispheres show generalized  atrophy with low moderate chronic small-vessel ischemic changes of the white matter. No supra tentorial large vessel stroke. No mass, hemorrhage, hydrocephalus or extra-axial collection. Vascular: Major vessels at the base of the brain show flow. Skull and upper cervical spine: Negative Sinuses/Orbits: Inflammatory changes of the sphenoid sinus. The other sinuses are clear. Orbits negative. Other: None IMPRESSION: 1. Acute infarction in the right para median pons. No swelling or hemorrhage. 2. Generalized atrophy. Moderate chronic small-vessel ischemic changes of the cerebral hemispheric white matter. Electronically Signed   By: Nelson Chimes M.D.   On: 05/27/2022 17:16   CT HEAD WO CONTRAST  Result Date: 05/27/2022 CLINICAL DATA:  Multiple neuro deficit, acute, stroke suspected. Mental status change. EXAM: CT HEAD WITHOUT CONTRAST TECHNIQUE: Contiguous axial images were obtained from the base of the skull through the vertex without intravenous contrast. RADIATION DOSE REDUCTION: This exam was performed according to the departmental dose-optimization program which includes automated exposure control, adjustment of the mA and/or kV according to patient size and/or use of iterative reconstruction technique. COMPARISON:  MRI 07/29/2021 FINDINGS: Brain: Low-density in the right para median pons consistent with stroke. This is of indeterminate age but was not visible in February of this year. No focal cerebellar insult. Cerebral hemispheres show atrophy, ex vacuo enlargement of the lateral ventricles, and chronic small vessel change of  the white matter. No large vessel territory stroke is visible. No mass or extra-axial collection. No hemorrhage. Vascular: There is atherosclerotic calcification of the major vessels at the base of the brain. Skull: No skull fracture. Sinuses/Orbits: Chronic inflammatory changes of the sphenoid sinus. Other paranasal sinuses are clear. Mastoid air cells are clear. Orbits negative.  Other: None IMPRESSION: 1. Low-density in the right para median pons consistent with stroke. This is of indeterminate age but was not visible in February of this year and therefore could be recent. 2. Atrophy and chronic small-vessel ischemic changes of the white matter. 3. Chronic inflammatory changes of the sphenoid sinus. Electronically Signed   By: Nelson Chimes M.D.   On: 05/27/2022 14:03   DG Chest Portable 1 View  Result Date: 05/27/2022 CLINICAL DATA:  Hypoxia EXAM: PORTABLE CHEST 1 VIEW COMPARISON:  01/19/2016 FINDINGS: Artifact overlies the chest. Mild cardiomegaly. Mild aortic tortuosity. The lungs are clear. No edema, infiltrate, collapse or effusion. IMPRESSION: No active disease. Mild cardiomegaly and aortic tortuosity. Electronically Signed   By: Nelson Chimes M.D.   On: 05/27/2022 12:58    Microbiology: Results for orders placed or performed during the hospital encounter of 05/27/22  Culture, Urine (Do not remove urinary catheter, catheter placed by urology or difficult to place)     Status: Abnormal   Collection Time: 05/27/22 12:00 PM   Specimen: Urine, Catheterized  Result Value Ref Range Status   Specimen Description URINE, CATHETERIZED  Final   Special Requests   Final    NONE Performed at [facility], [place] [address], [place], [place] [address]    Culture >=100,000 COLONIES/mL PROTEUS MIRABILIS (A)  Final   Report Status 05/31/2022 FINAL  Final   Organism ID, Bacteria PROTEUS MIRABILIS (A)  Final      Susceptibility   Proteus mirabilis - MIC*    AMPICILLIN <=2 SENSITIVE Sensitive     CEFAZOLIN <=4 SENSITIVE Sensitive     CEFEPIME <=0.12 SENSITIVE Sensitive     CEFTRIAXONE <=0.25 SENSITIVE Sensitive     CIPROFLOXACIN <=0.25 SENSITIVE Sensitive     GENTAMICIN <=1 SENSITIVE Sensitive     IMIPENEM 2 SENSITIVE Sensitive     NITROFURANTOIN 128 RESISTANT Resistant     TRIMETH/SULFA <=20 SENSITIVE Sensitive     AMPICILLIN/SULBACTAM <=2 SENSITIVE Sensitive      PIP/TAZO <=4 SENSITIVE Sensitive     * >=100,000 COLONIES/mL PROTEUS MIRABILIS  Blood culture (routine x 2)     Status: None (Preliminary result)   Collection Time: 05/27/22 12:15 PM   Specimen: BLOOD  Result Value Ref Range Status   Specimen Description BLOOD SITE NOT SPECIFIED  Final   Special Requests   Final    BOTTLES DRAWN AEROBIC AND ANAEROBIC Blood Culture adequate volume   Culture   Final    NO GROWTH 4 DAYS Performed at [facility], [address], [place], [place] [address]    Report Status PENDING  Incomplete  Blood culture (routine x 2)     Status: None (Preliminary result)   Collection Time: 05/27/22 12:15 PM   Specimen: BLOOD  Result Value Ref Range Status   Specimen Description BLOOD SITE NOT SPECIFIED  Final   Special Requests   Final    BOTTLES DRAWN AEROBIC AND ANAEROBIC Blood Culture adequate volume   Culture   Final    NO GROWTH 4 DAYS Performed at [facility], [address] [place], [place] [address]    Report Status  PENDING  Incomplete  Culture, Urine (Do not remove urinary catheter, catheter placed by urology or difficult to place)     Status: Abnormal   Collection Time: 05/28/22  7:41 AM   Specimen: Urine, Catheterized  Result Value Ref Range Status   Specimen Description URINE, CATHETERIZED  Final   Special Requests   Final    NONE Performed at [facility], [address], [place], [place] [address]    Culture 10,000 COLONIES/mL PROTEUS MIRABILIS (A)  Final   Report Status 05/30/2022 FINAL  Final   Organism ID, Bacteria PROTEUS MIRABILIS (A)  Final      Susceptibility   Proteus mirabilis - MIC*    AMPICILLIN <=2 SENSITIVE Sensitive     CEFAZOLIN <=4 SENSITIVE Sensitive     CEFEPIME <=0.12 SENSITIVE Sensitive     CEFTRIAXONE <=0.25 SENSITIVE Sensitive     CIPROFLOXACIN <=0.25 SENSITIVE Sensitive     GENTAMICIN <=1 SENSITIVE Sensitive     IMIPENEM 2 SENSITIVE Sensitive     NITROFURANTOIN 128 RESISTANT Resistant      TRIMETH/SULFA <=20 SENSITIVE Sensitive     AMPICILLIN/SULBACTAM <=2 SENSITIVE Sensitive     PIP/TAZO <=4 SENSITIVE Sensitive     * 10,000 COLONIES/mL PROTEUS MIRABILIS   Labs: CBC: Recent Labs  Lab 05/27/22 1215 05/27/22 1222 05/28/22 0250 05/29/22 0257 05/30/22 0351 05/31/22 0337  WBC 8.3  --  10.3 15.3* 9.3 7.9  NEUTROABS 5.4  --   --   --   --  5.3  HGB 13.9 13.6 14.4 15.5 14.1 13.1  HCT 40.5 40.0 40.9 42.8 39.5 38.2*  MCV 94.4  --  92.5 88.8 91.0 93.9  PLT 283  --  279 331 288 675   Basic Metabolic Panel: Recent Labs  Lab 05/27/22 1215 05/27/22 1222 05/29/22 0257 05/30/22 0351 05/31/22 0337  NA 141 143 133* 134* 138  K 3.4* 3.5 3.5 3.6 3.5  CL 106 105 95* 103 107  CO2 23  --  23 20* 21*  GLUCOSE 107* 105* 154* 100* 93  BUN '9 9 17 17 16  '$ CREATININE 0.79 1.00 0.86 0.74 0.79  CALCIUM 9.0  --  9.6 9.0 8.7*  MG 1.9  --  1.8 2.0 1.8  PHOS  --   --   --  3.5 3.4   Liver Function Tests: Recent Labs  Lab 05/27/22 1215 05/31/22 0337  AST 23 27  ALT 19 19  ALKPHOS 88 67  BILITOT 0.6 0.7  PROT 7.1 6.3*  ALBUMIN 3.6 3.1*   CBG: No results for input(s): "GLUCAP" in the last 168 hours.  Discharge time spent: greater than 30 minutes.  Signed: Raiford Noble, DO [facility] 05/31/2022

## 2022-05-31 NOTE — Telephone Encounter (Signed)
Still admitted at [facility]

## 2022-05-31 NOTE — TOC Progression Note (Addendum)
Transition of Care [facility]) - Progression Note    Patient Details  Name: ERIN UECKER [name]: [id number] Date of Birth: 24-May-1946  Transition of Care [facility] CM/SW Contact  Jinger Neighbors, [place] Phone Number: 05/31/2022, 10:59 AM  Clinical Narrative:     CSW reviewed bed offers, printed, and provided/reviewed with pt. CSW also sent pt's dtr the accepted SNF facilities for review. CSW will continue to follow pt.  CSW received a message from pt's dtr stating [facility] a good option. CSW followed up with pt who also reports he would like to go to [facility]. CSW called Whitney at [facility] to inform her of pt's bed choice and she reports she will start auth.   Expected Discharge Plan: [place] Barriers to Discharge: Continued Medical Work up  Expected Discharge Plan and Services Expected Discharge Plan: [place]                                               Social Determinants of Health (SDOH) Interventions    Readmission Risk Interventions    03/19/2022    2:17 PM 02/27/2022    2:30 PM  Readmission Risk Prevention Plan  Post Dischage Appt Complete Complete  Medication Screening Complete Complete  Transportation Screening Complete Complete

## 2022-05-31 NOTE — Progress Notes (Signed)
Occupational Therapy Treatment Patient Details Name: Calvin [name] [name]: [id number] DOB: 04-10-1946 Today's Date: 05/31/2022   History of present illness Pt is a 76 y/o male who presented after being found sitting on the floor near his wheelchair on 05/27/22. 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 EtOH elevated on arrival. Pt also admitted for treatment of UTI and hypokalemia. PMH: BPH w/ chronic foley catheter, HTN, etoh use, L lower extremity fx (WBAT w/ cam boot per hospitalist note)   OT comments  Pt making good progress towards OT goals this session with focus on progression of OOB transfers. Pt with improving cognition though still benefits from sequencing and problem solving cues. Pt overall requires Mod A x 2 for pivot to chair using RW. Pt did fatigue with this task and require L knee blocking due to buckling. Recommend Stedy use for back to bed transfers to decrease fall risk. Continue to rec SNF rehab at DC with pt appearing agreeable to this today.   Recommendations for follow up therapy are one component of a multi-disciplinary discharge planning process, led by the attending physician.  Recommendations may be updated based on patient status, additional functional criteria and insurance authorization.    Follow Up Recommendations  Skilled nursing-short term rehab (<3 hours/day)     Assistance Recommended at Discharge Frequent or constant Supervision/Assistance  Patient can return home with the following  A lot of help with walking and/or transfers;A lot of help with bathing/dressing/bathroom   Equipment Recommendations  None recommended by OT    Recommendations for Other Services      Precautions / Restrictions Precautions Precautions: Fall;Other (comment) Precaution Comments: watch BP, DOE Required Braces or Orthoses: Other Brace Other Brace: LLE cam boot Restrictions Weight Bearing Restrictions: Yes LLE Weight Bearing: Weight bearing as tolerated        Mobility Bed Mobility Overal bed mobility: Needs Assistance Bed Mobility: Supine to Sit     Supine to sit: Mod assist, HOB elevated     General bed mobility comments: Assist to lift trunk, partial log roll to minimize chronic back pain. able to bring LEs off of bed with light assist and lift trunk using bedrail    Transfers Overall transfer level: Needs assistance Equipment used: Rolling walker (2 wheels) Transfers: Sit to/from Stand, Bed to chair/wheelchair/BSC Sit to Stand: Mod assist, +2 physical assistance, From elevated surface     Step pivot transfers: Mod assist, +2 safety/equipment     General transfer comment: Mod A x 2 to stand from bedside with RW, slow coordination of stepping to chair with initial Min A progressing to require Mod A for L knee blocking due to buckling     Balance Overall balance assessment: Needs assistance Sitting-balance support: Feet supported, Bilateral upper extremity supported Sitting balance-Leahy Scale: Fair Sitting balance - Comments: EOB without support   Standing balance support: Bilateral upper extremity supported, During functional activity, Reliant on assistive device for balance Standing balance-Leahy Scale: Poor                             ADL either performed or assessed with clinical judgement   ADL Overall ADL's : Needs assistance/impaired Eating/Feeding: Independent;Sitting   Grooming: Set up;Sitting           Upper Body Dressing : Set up;Sitting                     General ADL Comments:  Focus on progression of OOB activity w/ transfer training    Extremity/Trunk Assessment Upper Extremity Assessment Upper Extremity Assessment: Generalized weakness   Lower Extremity Assessment Lower Extremity Assessment: Defer to PT evaluation        Vision   Vision Assessment?: No apparent visual deficits   Perception     Praxis      Cognition Arousal/Alertness: Awake/alert Behavior During  Therapy: WFL for tasks assessed/performed, Restless Overall Cognitive Status: Impaired/Different from baseline Area of Impairment: Attention, Memory, Following commands, Safety/judgement, Problem solving, Awareness                   Current Attention Level: Selective Memory: Decreased short-term memory Following Commands: Follows one step commands with increased time, Follows one step commands consistently Safety/Judgement: Decreased awareness of safety, Decreased awareness of deficits Awareness: Emergent Problem Solving: Slow processing, Decreased initiation, Difficulty sequencing, Requires verbal cues, Requires tactile cues General Comments: follows one step directions well, pleasant and participatory with increased insight into situation        Exercises      Shoulder Instructions       General Comments HR 90s with activity, 3/4 DOE with activity that quickly normalized with seated rest break    Pertinent Vitals/ Pain       Pain Assessment Pain Assessment: Faces Faces Pain Scale: Hurts little more Pain Location: L hip, chronic back pain Pain Descriptors / Indicators: Grimacing, Guarding Pain Intervention(s): Monitored during session, Limited activity within patient's tolerance  Home Living                                          Prior Functioning/Environment              Frequency  Min 2X/week        Progress Toward Goals  OT Goals(current goals can now be found in the care plan section)  Progress towards OT goals: Progressing toward goals  Acute Rehab OT Goals Patient Stated Goal: regain mobility in order to go home OT Goal Formulation: With patient Time For Goal Achievement: 06/11/22 Potential to Achieve Goals: Fair ADL Goals Pt Will Perform Lower Body Bathing: with min assist;sitting/lateral leans;sit to/from stand Pt Will Perform Lower Body Dressing: with min assist;sitting/lateral leans;sit to/from stand Pt Will Transfer  to Toilet: with min assist;stand pivot transfer;bedside commode  Plan Discharge plan remains appropriate    Co-evaluation                 AM-PAC OT "6 Clicks" Daily Activity     Outcome Measure   Help from another person eating meals?: None Help from another person taking care of personal grooming?: A Little Help from another person toileting, which includes using toliet, bedpan, or urinal?: Total Help from another person bathing (including washing, rinsing, drying)?: A Lot Help from another person to put on and taking off regular upper body clothing?: A Little Help from another person to put on and taking off regular lower body clothing?: A Lot 6 Click Score: 15    End of Session Equipment Utilized During Treatment: Gait belt;Rolling walker (2 wheels)  OT Visit Diagnosis: Other abnormalities of gait and mobility (R26.89);Unsteadiness on feet (R26.81);Muscle weakness (generalized) (M62.81)   Activity Tolerance Patient tolerated treatment well   Patient Left in chair;with call bell/phone within reach;with chair alarm set;with nursing/sitter in room   Nurse Communication Mobility status;Need for lift  equipment        Time: [phone] OT Time Calculation (min): 30 min  Charges: OT General Charges $OT Visit: 1 Visit OT Treatments $Self Care/Home Management : 8-22 mins $Therapeutic Activity: 8-22 mins  Malachy Chamber, OTR/L Acute Rehab Services Office: [phone]   Layla Maw 05/31/2022, 10:37 AM

## 2022-06-01 ENCOUNTER — Telehealth: Payer: Self-pay

## 2022-06-01 DIAGNOSIS — I639 Cerebral infarction, unspecified: Secondary | ICD-10-CM | POA: Diagnosis not present

## 2022-06-01 DIAGNOSIS — Z978 Presence of other specified devices: Secondary | ICD-10-CM | POA: Diagnosis not present

## 2022-06-01 DIAGNOSIS — N320 Bladder-neck obstruction: Secondary | ICD-10-CM | POA: Diagnosis not present

## 2022-06-01 DIAGNOSIS — N400 Enlarged prostate without lower urinary tract symptoms: Secondary | ICD-10-CM | POA: Diagnosis not present

## 2022-06-01 LAB — CULTURE, BLOOD (ROUTINE X 2)
Culture: NO GROWTH
Culture: NO GROWTH
Special Requests: ADEQUATE
Special Requests: ADEQUATE

## 2022-06-01 NOTE — Plan of Care (Signed)
  Problem: Education: Goal: Knowledge of disease or condition will improve Outcome: Progressing Goal: Knowledge of secondary prevention will improve (MUST DOCUMENT ALL) Outcome: Progressing Goal: Knowledge of patient specific risk factors will improve [place] N/A or DELETE if not current risk factor) Outcome: Progressing   Problem: Ischemic Stroke/TIA Tissue Perfusion: Goal: Complications of ischemic stroke/TIA will be minimized Outcome: Progressing   Problem: Coping: Goal: Will verbalize positive feelings about self Outcome: Progressing Goal: Will identify appropriate support needs Outcome: Progressing   Problem: Nutrition: Goal: Risk of aspiration will decrease Outcome: Progressing

## 2022-06-01 NOTE — Telephone Encounter (Signed)
[facility] [phone], I had to leave message on voicemail to call the office back.

## 2022-06-01 NOTE — Progress Notes (Signed)
PROGRESS NOTE    Calvin [name]  JHE:[id number] DOB: 11/24/1945 DOA: 05/27/2022 PCP: Laurey Morale, MD   Brief Narrative:  Calvin [name] is a 76 y.o. male with past medical history significant for chronic bladder outlet obstruction/BPH with chronic Foley catheter, history of recurrent UTI, nephrolithiasis, HTN, anxiety/depression, EtOH use disorder, left lower extremity stress fracture currently utilizing a cam walker boot who presented via EMS to [facility] ED on 12/10 with confusion, weakness.  Patient lives alone but has a neighbor who has been helping him out more frequently.  Last known normal was night prior to admission.  On EMS arrival, patient was found sitting on the floor near his wheelchair with suspicion that he slid out of the wheelchair with unknown downtime.  He was minimally responsive to first and there was initial concern for hypotension and hypoxia.  Upon EMS standing him up his symptoms somewhat improved and he was transported to the ED for further evaluation.  Patient denied fever, no chills, no chest pain, no abdominal pain, no nausea/vomiting/diarrhea.   In the ED, temperature 97.8 F, HR 90, RR 16, BP 113/76, SpO2 96% on room air.  Sodium 141, potassium 3.4, chloride 106, CO2 23, glucose 107, BUN 9, creatinine 0.79.  Magnesium 1.9, AST 23, ALT 19, total bilirubin 0.6.  Lactic acid 2.1.  WBC 8.3, hemoglobin 13.9, platelets 283.  INR 1.1.  Urinalysis with large leukocytes, positive nitrite, few bacteria, 21-50 WBCs.  UDS negative.  CT head without contrast with low-density right paramedian pons consistent with CVA, atrophy and chronic small vessel ischemic changes of the white matter.  CT angiogram head/neck with no emergent finding, generalized atherosclerosis most notably causing 60% narrowing at the origin of the nondominant left vertebral artery.  MR brain without contrast with acute infarction right paramedial pons, no swelling or hemorrhage, generalized atrophy and moderate chronic  small vessel ischemic changes of the cerebral hemispheric white matter.  Neurology was consulted.  Hospitalist service consulted for evaluation management of acute CVA and UTI.   **Interim History Continues to appear fatigued and lethargic.  PT OT still recommending SNF and he continues to not void properly still had to have his Foley catheter reinserted. He is improving clinically along with his labs and is medically stable to D/C to SNF when bed is available.    Assessment and Plan:  Acute Ischemic CVA -Presenting to the ED via EMS after being found down in his home by his neighbor on the floor with unknown downtime.   -Last known normal was day prior to admission.   -MR brain with acute infarction right paramedial pons.   -CT angiogram head/neck with no emergent finding.  -LDL 92.   -Hemoglobin A1c 5.5.   -TTE with LVEF 60 to 65%, mild concentric LVH, no LV regional wall motion normalities, no aortic stenosis, aortic dilation ascending aorta measuring 41 mm, IVC normal in size.   -UDS Negative  -Was followed by neurology initially and now signed off. -C/w Atorvastatin 40 mg p.o. daily given that LDL is not at goal -C/w DAPT with Aspirin 81 mg p.o. daily, Plavix 75 mg p.o. daily x 21 days followed by aspirin alone indefinitely  -Outpatient follow-up with [facility], Dr. Carles Collet on 07/17/2021. -PT OT recommending SNF and TOC assisting with discharge disposition and he is agreeable to SNF    Proteus Urinary tract infection -Complicated by this requirement of chronic Foley catheter use.   -Urinalysis showed a cloudy appearance with moderate hemoglobin, large leukocytes,  positive nitrites, few bacteria, 11-20 RBCs per high-power field, 21-50 WBCs -Follows with urology outpatient.   -Review of previous urine cultures all notable for E. coli and only resistance to Ampicillin. -WBC went from 10.3 -> 15.3 -> 9.3 -> 7.9 -Blood cultures x 2: No growth to Date at 4 days  -Urine culture:  Showing Pan-sensitive Proteus 10,000 colony-forming units and is only resistant to Macrobid; Prior Urine Cx showed >100,000 CFU of GNR that was also Proteus  -C/w Ceftriaxone 1 g IV every 24 hours while hospitalized and change po closer to D/C to complete course    Hypokalemia -Repleted during the hospitalization. -Improved and K+ went from 3.5 -> 3.6 -> 3.5 on last check  -Continue to Monitor and Trend -Repeat CMP within 1 week    History of bladder outlet obstruction/BPH with chronic Foley catheter -Patient with chronic Foley catheter in place on admission, developed obstruction during hospitalization and was exchanged on 05/29/2022 with 18 French catheter. -Continue Foley catheter (Had to be replaced) -C/w Tamsulosin 0.4 mg p.o. daily -C/w Finasteride 5 mg p.o. daily -Continue outpatient follow-up with urology -Continue to monitor urinary output, bladder scan as needed   Essential Hypertension:  -Home medication include Norvasc 5 mg p.o. daily, losartan '100mg'$  p.o. daily. -Was Holding antihypertensives to allow permissive hypertension in the first 24hours following acute CVA up to 220/120. -Continue to monitor BP closely and last BP reading is 134/79   Anxiety/Depression: -C/w Zoloft 100 mg p.o. daily   Left lower extremity stress fracture -Patient follows with orthopedics outpatient, currently utilizing a cam walker boot for ambulation. -Weightbearing as tolerates left lower extremity -Continue outpatient follow-up with orthopedics   EtOH use disorder/intoxication/withdrawal -EtOH level elevated to 14 on admission.   -Counseled on need for complete cessation -CIWA protocol   Hyponatremia -Patient's Na+ went from 143 -> 133 -> 134 -> 138 -Continue to Monitor and Trend -Repeat CMP within 1 week    Metabolic Acidosis -Patient's CO2 is now 21, AG is 10, Chloride Level is 107 on last check  -Continue to Monitor and Trend -Repeat CMP within 1 week     Hypoalbuminemia -Patient's Albumin Level is now 3.1 on last check  -Continue to Monitor and Trend -Repeat CMP within 1 week    Diarrhea/Loose Stools -Unlikely C Diff so cancelled Testing -GI Pathogen panel Negative   Weakness/debility/deconditioning: -PT/OT recommend SNF placement, TOC for evaluation and discussing Options with Patient  DVT prophylaxis: enoxaparin (LOVENOX) injection 40 mg Start: 05/27/22 2200    Code Status: Full Code Family Communication: No family currently at bedside   Disposition Plan:  Level of care: Telemetry Medical Status is: Inpatient Remains inpatient appropriate because: Awaiting insurance authorization as he is medically stable   Consultants:  None  Procedures:  As delineated as above   Antimicrobials:  Anti-infectives (From admission, onward)    Start     Dose/Rate Route Frequency Ordered Stop   05/31/22 1415  cephALEXin (KEFLEX) capsule 500 mg        500 mg Oral Every 12 hours 05/31/22 1316 06/04/22 0959   05/31/22 0000  cephALEXin (KEFLEX) 500 MG capsule        500 mg Oral Every 12 hours 05/31/22 1327 06/04/22 2359   05/28/22 0400  cefTRIAXone (ROCEPHIN) 1 g in sodium chloride 0.9 % 100 mL IVPB  Status:  Discontinued        1 g 200 mL/hr over 30 Minutes Intravenous Every 24 hours 05/27/22 1827 05/31/22 2006   05/27/22  1715  ceFEPIme (MAXIPIME) 2 g in sodium chloride 0.9 % 100 mL IVPB        2 g 200 mL/hr over 30 Minutes Intravenous  Once 05/27/22 1707 05/27/22 1846       Subjective: Seen and examined at bedside and he states that he is very fatigued and tired.  Otherwise he is doing fairly well.  No other concerns or complaints at this time.  Objective: Vitals:   06/01/22 1237 06/01/22 1246 06/01/22 1535 06/01/22 1540  BP:  (!) 153/98  134/79  Pulse: 92   90  Resp:  (!) 22 (!) 26 13  Temp: 98.6 F (37 C)   98.2 F (36.8 C)  TempSrc: Oral   Oral  SpO2:        Intake/Output Summary (Last 24 hours) at 06/01/2022 1726 Last  data filed at 06/01/2022 1538 Gross per 24 hour  Intake 2266.05 ml  Output 7450 ml  Net -5183.95 ml   There were no vitals filed for this visit.  Examination: Physical Exam:  Constitutional: WN/WD elderly chronically ill-appearing Caucasian male in no acute distress Respiratory: Diminished to auscultation bilaterally, no wheezing, rales, rhonchi or crackles. Normal respiratory effort and patient is not tachypenic. No accessory muscle use.  Unlabored breathing Cardiovascular: RRR, no murmurs / rubs / gallops. S1 and S2 auscultated. Abdomen: Soft, non-tender, non-distended.  Bowel sounds positive.  GU: Deferred. Musculoskeletal: No clubbing / cyanosis of digits/nails. No joint deformity upper and lower extremities.  Skin: No rashes, lesions, ulcers on limited skin evaluation. No induration; Warm and dry.  Neurologic: CN 2-12 grossly intact with no focal deficits. Romberg sign and cerebellar reflexes not assessed.  Psychiatric: Normal judgment and insight. Alert and oriented x 3. Normal mood and appropriate affect.   Data Reviewed: I have personally reviewed following labs and imaging studies  CBC: Recent Labs  Lab 05/27/22 1215 05/27/22 1222 05/28/22 0250 05/29/22 0257 05/30/22 0351 05/31/22 0337  WBC 8.3  --  10.3 15.3* 9.3 7.9  NEUTROABS 5.4  --   --   --   --  5.3  HGB 13.9 13.6 14.4 15.5 14.1 13.1  HCT 40.5 40.0 40.9 42.8 39.5 38.2*  MCV 94.4  --  92.5 88.8 91.0 93.9  PLT 283  --  279 331 288 916   Basic Metabolic Panel: Recent Labs  Lab 05/27/22 1215 05/27/22 1222 05/29/22 0257 05/30/22 0351 05/31/22 0337  NA 141 143 133* 134* 138  K 3.4* 3.5 3.5 3.6 3.5  CL 106 105 95* 103 107  CO2 23  --  23 20* 21*  GLUCOSE 107* 105* 154* 100* 93  BUN '9 9 17 17 16  '$ CREATININE 0.79 1.00 0.86 0.74 0.79  CALCIUM 9.0  --  9.6 9.0 8.7*  MG 1.9  --  1.8 2.0 1.8  PHOS  --   --   --  3.5 3.4   GFR: CrCl cannot be calculated (Unknown ideal weight.). Liver Function  Tests: Recent Labs  Lab 05/27/22 1215 05/31/22 0337  AST 23 27  ALT 19 19  ALKPHOS 88 67  BILITOT 0.6 0.7  PROT 7.1 6.3*  ALBUMIN 3.6 3.1*   No results for input(s): "LIPASE", "AMYLASE" in the last 168 hours. No results for input(s): "AMMONIA" in the last 168 hours. Coagulation Profile: Recent Labs  Lab 05/27/22 1215  INR 1.1   Cardiac Enzymes: Recent Labs  Lab 05/27/22 1215  CKTOTAL 49   BNP (last 3 results) No results for input(s): "PROBNP"  in the last 8760 hours. HbA1C: No results for input(s): "HGBA1C" in the last 72 hours. CBG: No results for input(s): "GLUCAP" in the last 168 hours. Lipid Profile: No results for input(s): "CHOL", "HDL", "LDLCALC", "TRIG", "CHOLHDL", "LDLDIRECT" in the last 72 hours. Thyroid Function Tests: No results for input(s): "TSH", "T4TOTAL", "FREET4", "T3FREE", "THYROIDAB" in the last 72 hours. Anemia Panel: No results for input(s): "VITAMINB12", "FOLATE", "FERRITIN", "TIBC", "IRON", "RETICCTPCT" in the last 72 hours. Sepsis Labs: Recent Labs  Lab 05/27/22 1215 05/27/22 1535  LATICACIDVEN 2.1* 2.1*   Recent Results (from the past 240 hour(s))  Culture, Urine (Do not remove urinary catheter, catheter placed by urology or difficult to place)     Status: Abnormal   Collection Time: 05/27/22 12:00 PM   Specimen: Urine, Catheterized  Result Value Ref Range Status   Specimen Description URINE, CATHETERIZED  Final   Special Requests   Final    NONE Performed at [facility], [place] [address], [place], [place] [address]    Culture >=100,000 COLONIES/mL PROTEUS MIRABILIS (A)  Final   Report Status 05/31/2022 FINAL  Final   Organism ID, Bacteria PROTEUS MIRABILIS (A)  Final      Susceptibility   Proteus mirabilis - MIC*    AMPICILLIN <=2 SENSITIVE Sensitive     CEFAZOLIN <=4 SENSITIVE Sensitive     CEFEPIME <=0.12 SENSITIVE Sensitive     CEFTRIAXONE <=0.25 SENSITIVE Sensitive     CIPROFLOXACIN <=0.25 SENSITIVE Sensitive      GENTAMICIN <=1 SENSITIVE Sensitive     IMIPENEM 2 SENSITIVE Sensitive     NITROFURANTOIN 128 RESISTANT Resistant     TRIMETH/SULFA <=20 SENSITIVE Sensitive     AMPICILLIN/SULBACTAM <=2 SENSITIVE Sensitive     PIP/TAZO <=4 SENSITIVE Sensitive     * >=100,000 COLONIES/mL PROTEUS MIRABILIS  Blood culture (routine x 2)     Status: None   Collection Time: 05/27/22 12:15 PM   Specimen: BLOOD  Result Value Ref Range Status   Specimen Description BLOOD SITE NOT SPECIFIED  Final   Special Requests   Final    BOTTLES DRAWN AEROBIC AND ANAEROBIC Blood Culture adequate volume   Culture   Final    NO GROWTH 5 DAYS Performed at [facility], [address] [place], [place] [address]    Report Status 06/01/2022 FINAL  Final  Blood culture (routine x 2)     Status: None   Collection Time: 05/27/22 12:15 PM   Specimen: BLOOD  Result Value Ref Range Status   Specimen Description BLOOD SITE NOT SPECIFIED  Final   Special Requests   Final    BOTTLES DRAWN AEROBIC AND ANAEROBIC Blood Culture adequate volume   Culture   Final    NO GROWTH 5 DAYS Performed at [facility], [place] [address], [place], [place] [address]    Report Status 06/01/2022 FINAL  Final  Culture, Urine (Do not remove urinary catheter, catheter placed by urology or difficult to place)     Status: Abnormal   Collection Time: 05/28/22  7:41 AM   Specimen: Urine, Catheterized  Result Value Ref Range Status   Specimen Description URINE, CATHETERIZED  Final   Special Requests   Final    NONE Performed at [facility], [place] [address], [place], [place] [address]    Culture 10,000 COLONIES/mL PROTEUS MIRABILIS (A)  Final   Report Status 05/30/2022 FINAL  Final   Organism ID, Bacteria PROTEUS MIRABILIS (A)  Final  Susceptibility   Proteus mirabilis - MIC*    AMPICILLIN <=2 SENSITIVE Sensitive     CEFAZOLIN <=4 SENSITIVE Sensitive     CEFEPIME <=0.12 SENSITIVE Sensitive     CEFTRIAXONE <=0.25 SENSITIVE  Sensitive     CIPROFLOXACIN <=0.25 SENSITIVE Sensitive     GENTAMICIN <=1 SENSITIVE Sensitive     IMIPENEM 2 SENSITIVE Sensitive     NITROFURANTOIN 128 RESISTANT Resistant     TRIMETH/SULFA <=20 SENSITIVE Sensitive     AMPICILLIN/SULBACTAM <=2 SENSITIVE Sensitive     PIP/TAZO <=4 SENSITIVE Sensitive     * 10,000 COLONIES/mL PROTEUS MIRABILIS  Gastrointestinal Panel by PCR , Stool     Status: None   Collection Time: 05/29/22  7:02 AM   Specimen: Stool  Result Value Ref Range Status   Campylobacter species NOT DETECTED NOT DETECTED Final   Plesimonas shigelloides NOT DETECTED NOT DETECTED Final   Salmonella species NOT DETECTED NOT DETECTED Final   Yersinia enterocolitica NOT DETECTED NOT DETECTED Final   Vibrio species NOT DETECTED NOT DETECTED Final   Vibrio cholerae NOT DETECTED NOT DETECTED Final   Enteroaggregative E coli (EAEC) NOT DETECTED NOT DETECTED Final   Enteropathogenic E coli (EPEC) NOT DETECTED NOT DETECTED Final   Enterotoxigenic E coli (ETEC) NOT DETECTED NOT DETECTED Final   Shiga like toxin producing E coli (STEC) NOT DETECTED NOT DETECTED Final   Shigella/Enteroinvasive E coli (EIEC) NOT DETECTED NOT DETECTED Final   Cryptosporidium NOT DETECTED NOT DETECTED Final   Cyclospora cayetanensis NOT DETECTED NOT DETECTED Final   Entamoeba histolytica NOT DETECTED NOT DETECTED Final   Giardia lamblia NOT DETECTED NOT DETECTED Final   Adenovirus F40/41 NOT DETECTED NOT DETECTED Final   Astrovirus NOT DETECTED NOT DETECTED Final   Norovirus GI/GII NOT DETECTED NOT DETECTED Final   Rotavirus A NOT DETECTED NOT DETECTED Final   Sapovirus (I, II, IV, and V) NOT DETECTED NOT DETECTED Final    Comment: Performed at [facility], [address] [place], [place] [address]    Radiology Studies: No results found.  Scheduled Meds:  aspirin EC  81 mg Oral Daily   atorvastatin  40 mg Oral Daily   cephALEXin  500 mg Oral Q12H   Chlorhexidine Gluconate Cloth  6 each  Topical Daily   clopidogrel  75 mg Oral Daily   enoxaparin (LOVENOX) injection  40 mg Subcutaneous Q24H   finasteride  5 mg Oral Daily   folic acid  1 mg Oral Daily   multivitamin with minerals  1 tablet Oral Daily   sertraline  100 mg Oral Daily   tamsulosin  0.4 mg Oral Daily   thiamine  100 mg Oral Daily   Or   thiamine  100 mg Intravenous Daily   Continuous Infusions:  lactated ringers 100 mL/hr at 05/31/22 1620    LOS: 3 days   Raiford Noble, DO [facility] Available via Epic secure chat 7am-7pm After these hours, please refer to coverage provider listed on [email or web address] 06/01/2022, 5:26 PM

## 2022-06-01 NOTE — Progress Notes (Signed)
Physical Therapy Treatment Patient Details Name: Calvin [name] [name]: [id number] DOB: [birth year] Today's Date: 06/01/2022   History of Present Illness Pt is a 76 y/o male who presented after being found sitting on the floor near his wheelchair on 05/27/22. 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 EtOH elevated on arrival. Pt also admitted for treatment of UTI and hypokalemia. PMH: BPH w/ chronic foley catheter, HTN, etoh use, L lower extremity fx (WBAT w/ cam boot per hospitalist note)    PT Comments    Pt greeted supine in bed and agreeable to session with focus on functional transfers and standing tolerance. Pt able to come to sitting EOB with min assist with significantly increased time as pt with noted difficulty sequencing mobility. Pt continues to demonstrate poor sitting balance with posterior bias needed intermittent assist to maintain upright sitting. Pt able to come to stand with min-mod assist from EOB and maintain for extended time for peri-care. Pt able to step pivot to chair with mod assist with noted LLE weakness needing physical assist to advance and for asssit for RW management. Pt agreeable to time up in chair at end of session.  Current plan remains appropriate to address deficits and maximize functional independence and decrease caregiver burden. Pt continues to benefit from skilled PT services to progress toward functional mobility goals.    Recommendations for follow up therapy are one component of a multi-disciplinary discharge planning process, led by the attending p Follow Up Recommendations  Skilled nursing-short term rehab (<3 hours/day) Can patient physically be transported by private vehicle: No   Assistance Recommended at Discharge Frequent or constant Supervision/Assistance  Patient can return home with the following Two people to help with walking and/or transfers;Two people to help with bathing/dressing/bathroom;Help with stairs or ramp for  entrance;Assistance with cooking/housework   Equipment Recommendations  None recommended by PT    Recommendations for Other Services       Precautions / Restrictions Precautions Precautions: Fall;Other (comment) Precaution Comments: watch BP, DOE Required Braces or Orthoses: Other Brace Other Brace: LLE cam boot Restrictions Weight Bearing Restrictions: Yes LLE Weight Bearing: Weight bearing as tolerated     Mobility  Bed Mobility Overal bed mobility: Needs Assistance Bed Mobility: Supine to Sit     Supine to sit: Mod assist, HOB elevated     General bed mobility comments: Assist to lift trunk, partial log roll to minimize chronic back pain. able to bring LEs off of bed with light assist and lift trunk using bedrail    Transfers Overall transfer level: Needs assistance Equipment used: Rolling walker (2 wheels) Transfers: Sit to/from Stand, Bed to chair/wheelchair/BSC Sit to Stand: Mod assist   Step pivot transfers: Mod assist       General transfer comment: Mod A to stand from bedside with RW, slow coordination of stepping to chair with initial Min A progressing to require Mod A for L knee blocking due to buckling    Ambulation/Gait                   Stairs             Wheelchair Mobility    Modified Rankin (Stroke Patients Only) Modified Rankin (Stroke Patients Only) Pre-Morbid Rankin Score: Moderately severe disability Modified Rankin: Severe disability     Balance Overall balance assessment: Needs assistance Sitting-balance support: Feet supported, Bilateral upper extremity supported Sitting balance-Leahy Scale: Fair Sitting balance - Comments: EOB without support   Standing balance support:  Bilateral upper extremity supported, During functional activity, Reliant on assistive device for balance Standing balance-Leahy Scale: Poor Standing balance comment: heavy reliance on RW                            Cognition  Arousal/Alertness: Awake/alert Behavior During Therapy: WFL for tasks assessed/performed, Restless Overall Cognitive Status: Impaired/Different from baseline Area of Impairment: Attention, Memory, Following commands, Safety/judgement, Problem solving, Awareness                 Orientation Level: Disoriented to, Situation Current Attention Level: Selective Memory: Decreased short-term memory Following Commands: Follows one step commands with increased time, Follows one step commands consistently Safety/Judgement: Decreased awareness of safety, Decreased awareness of deficits Awareness: Emergent Problem Solving: Slow processing, Decreased initiation, Difficulty sequencing, Requires verbal cues, Requires tactile cues General Comments: follows one step directions well, pleasant and participatory with increased insight into situation        Exercises      General Comments        Pertinent Vitals/Pain Pain Assessment Pain Assessment: No/denies pain Pain Intervention(s): Monitored during session    Home Living                          Prior Function            PT Goals (current goals can now be found in the care plan section) Acute Rehab PT Goals PT Goal Formulation: With patient Time For Goal Achievement: 06/11/22    Frequency    Min 3X/week      PT Plan      Co-evaluation              AM-PAC PT "6 Clicks" Mobility   Outcome Measure  Help needed turning from your back to your side while in a flat bed without using bedrails?: A Little Help needed moving from lying on your back to sitting on the side of a flat bed without using bedrails?: A Little Help needed moving to and from a bed to a chair (including a wheelchair)?: A Lot Help needed standing up from a chair using your arms (e.g., wheelchair or bedside chair)?: A Lot Help needed to walk in hospital room?: Total Help needed climbing 3-5 steps with a railing? : Total 6 Click Score: 12     End of Session Equipment Utilized During Treatment: Gait belt Activity Tolerance: Patient tolerated treatment well Patient left: with call bell/phone within reach;in chair;with chair alarm set Nurse Communication: Mobility status PT Visit Diagnosis: Other abnormalities of gait and mobility (R26.89);Muscle weakness (generalized) (M62.81)     Time: 3267-1245 PT Time Calculation (min) (ACUTE ONLY): 30 min  Charges:  $Therapeutic Activity: 23-37 mins                     Roth Ress R. PTA Acute Rehabilitation Services Office: [place] 06/01/2022, 3:51 PM

## 2022-06-01 NOTE — Telephone Encounter (Signed)
Spoke with Joey with [facility] advised that PT orders requested was approved by Dr Sarajane Jews

## 2022-06-01 NOTE — Telephone Encounter (Signed)
Spoke with patient daughter and Charna Archer place tolet both of them know when the Appt is on 06-04-22 at 2:30

## 2022-06-01 NOTE — Telephone Encounter (Signed)
Contacted center, they still have not got a call or even a report for patient to be discharged per the center.11:47am, will touch base in a bit

## 2022-06-01 NOTE — Telephone Encounter (Signed)
3:10 still at [facility] advised to Dr.Tat, awaiting insurance approval for center

## 2022-06-01 NOTE — Discharge Summary (Signed)
Physician Discharge Summary   Patient: Calvin [name] [name]: [id number] DOB: Apr 12, 1946  Admit date:     05/27/2022  Discharge date: 05/31/22  Discharge Physician: Raiford Noble, DO   PCP: Laurey Morale, MD   Recommendations at discharge:   Follow up with PCP within 1-2 weeks and repeat CBC,CMP, Mag, Phos within 1 week Follow up with Neurology in the outpatient setting within 6 weeks; Appointment Scheduled with Dr. Carles Collet on 07/17/21 Follow-up with orthopedic surgery in outpatient setting within 1 to 2 weeks Follow-up with urology in outpatient setting  Discharge Diagnoses: Principal Problem:   Acute CVA (cerebrovascular accident) [facility]) Active Problems:   Essential hypertension   Enlarged prostate   Depression with anxiety   Bladder outflow obstruction   UTI (urinary tract infection)   Indwelling Foley catheter present   CVA (cerebral vascular accident) ([place])   Pressure injury of skin  Resolved Problems:   * No resolved hospital problems. [facility] Course: Calvin [name] is a 76 y.o. male with past medical history significant for chronic bladder outlet obstruction/BPH with chronic Foley catheter, history of recurrent UTI, nephrolithiasis, HTN, anxiety/depression, EtOH use disorder, left lower extremity stress fracture currently utilizing a cam walker boot who presented via EMS to [facility] ED on 12/10 with confusion, weakness.  Patient lives alone but has a neighbor who has been helping him out more frequently.  Last known normal was night prior to admission.  On EMS arrival, patient was found sitting on the floor near his wheelchair with suspicion that he slid out of the wheelchair with unknown downtime.  He was minimally responsive to first and there was initial concern for hypotension and hypoxia.  Upon EMS standing him up his symptoms somewhat improved and he was transported to the ED for further evaluation.  Patient denied fever, no chills, no chest pain, no abdominal pain, no  nausea/vomiting/diarrhea.   In the ED, temperature 97.8 F, HR 90, RR 16, BP 113/76, SpO2 96% on room air.  Sodium 141, potassium 3.4, chloride 106, CO2 23, glucose 107, BUN 9, creatinine 0.79.  Magnesium 1.9, AST 23, ALT 19, total bilirubin 0.6.  Lactic acid 2.1.  WBC 8.3, hemoglobin 13.9, platelets 283.  INR 1.1.  Urinalysis with large leukocytes, positive nitrite, few bacteria, 21-50 WBCs.  UDS negative.  CT head without contrast with low-density right paramedian pons consistent with CVA, atrophy and chronic small vessel ischemic changes of the white matter.  CT angiogram head/neck with no emergent finding, generalized atherosclerosis most notably causing 60% narrowing at the origin of the nondominant left vertebral artery.  MR brain without contrast with acute infarction right paramedial pons, no swelling or hemorrhage, generalized atrophy and moderate chronic small vessel ischemic changes of the cerebral hemispheric white matter.  Neurology was consulted.  Hospitalist service consulted for evaluation management of acute CVA and UTI.   **Interim History Continues to appear fatigued and lethargic.  PT OT still recommending SNF and he continues to not void properly still had to have his Foley catheter reinserted. He is improving clinically along with his labs and is medically stable to D/C to SNF when bed is available.   ADDENDUM 06/04/22:  Patient was D/C'd 05/31/26  but did not leave due to lack of Insurance Auth. He remains medically stable to D/C and had no acute events overnight.     Assessment and Plan:  Acute Ischemic CVA -Presenting to the ED via EMS after being found down in his home by his neighbor  on the floor with unknown downtime.   -Last known normal was day prior to admission.   -MR brain with acute infarction right paramedial pons.   -CT angiogram head/neck with no emergent finding.  -LDL 92.   -Hemoglobin A1c 5.5.   -TTE with LVEF 60 to 65%, mild concentric LVH, no LV regional  wall motion normalities, no aortic stenosis, aortic dilation ascending aorta measuring 41 mm, IVC normal in size.   -UDS Negative  -Was followed by neurology initially and now signed off. -C/w Atorvastatin 40 mg p.o. daily given that LDL is not at goal -C/w DAPT with Aspirin 81 mg p.o. daily, Plavix 75 mg p.o. daily x 21 days followed by aspirin alone indefinitely  -Outpatient follow-up with [facility], Dr. Carles Collet on 07/17/2021. -PT OT recommending SNF and TOC assisting with discharge disposition and he is agreeable to SNF and is medically stable to D/C and awaiting Insurance Auth and finally received it today   Proteus Urinary tract infection -Complicated by this requirement of chronic Foley catheter use.   -Urinalysis showed a cloudy appearance with moderate hemoglobin, large leukocytes, positive nitrites, few bacteria, 11-20 RBCs per high-power field, 21-50 WBCs -Follows with urology outpatient.   -Review of previous urine cultures all notable for E. coli and only resistance to Ampicillin. -WBC went from 10.3 -> 15.3 -> 9.3 -> 7.9 on last check  -Blood cultures x 2: No growth to Date at 5 days  -Urine culture: Showing Pan-sensitive Proteus 10,000 colony-forming units and is only resistant to Macrobid; Prior Urine Cx showed >100,000 CFU of GNR that was also Proteus  -C/w Ceftriaxone 1 g IV every 24 hours while hospitalized and change po closer to D/C to complete course    Hypokalemia -Repleted during the hospitalization. -Improved and K+ went from 3.5 -> 3.6 -> 3.5 on last check  -Continue to Monitor and Trend -Repeat CMP within 1 week    History of bladder outlet obstruction/BPH with chronic Foley catheter -Patient with chronic Foley catheter in place on admission, developed obstruction during hospitalization and was exchanged on 05/29/2022 with 18 French catheter. -Continue Foley catheter (Had to be replaced) -C/w Tamsulosin 0.4 mg p.o. daily -C/w Finasteride 5 mg p.o.  daily -Continue outpatient follow-up with urology -Continue to monitor urinary output, bladder scan as needed   Essential Hypertension:  -Home medication include Norvasc 5 mg p.o. daily, losartan '100mg'$  p.o. daily. -Was Holding antihypertensives to allow permissive hypertension in the first 24hours following acute CVA up to 220/120. -Continue to monitor BP closely and last BP reading is improved to 144/109   Anxiety/Depression: -C/w Zoloft 100 mg p.o. daily   Left lower extremity stress fracture -Patient follows with orthopedics outpatient, currently utilizing a cam walker boot for ambulation. CAM boot at bedside  -Weightbearing as tolerates left lower extremity -Continue outpatient follow-up with orthopedics   EtOH Use Disorder/intoxication/withdrawal -EtOH level elevated to 14 on admission.   -Counseled on need for complete cessation -CIWA protocol was initiated but he remains Stable   Hyponatremia -Patient's Na+ went from 143 -> 133 -> 134 -> 138 on last check  -Continue to Monitor and Trend -Repeat CMP within 1 week    Metabolic Acidosis -Patient's CO2 is now 21, AG is 10, Chloride Level is 107 on last check  -Continue to Monitor and Trend -Repeat CMP within 1 week    Hypoalbuminemia -Patient's Albumin Level is now 3.1 on last check  -Continue to Monitor and Trend -Repeat CMP within 1 week  Diarrhea/Loose Stools -Unlikely C Diff so cancelled Testing given stool sample consistency  -GI Pathogen panel Negative -Continue to Monitor    Weakness/debility/deconditioning: -PT/OT recommend SNF placement, TOC for evaluation and discussing Options with Patient anjd he is medically stable to D/C but awaiting Insurance Auth  Pressure Ulcer, poA Active Pressure Injury/Wound(s)     Pressure Ulcer  Duration          Pressure Injury 05/28/22 Coccyx Medial Stage 1 -  Intact skin with non-blanchable redness of a localized area usually over a bony prominence. 2 days            Consultants: Neurology  Procedures performed: As above  Disposition: Skilled nursing facility Diet recommendation:  Cardiac diet DISCHARGE MEDICATION: Allergies as of 06/04/2022   No Known Allergies      Medication List     STOP taking these medications    diazepam 5 MG tablet Commonly known as: VALIUM       TAKE these medications    amLODipine 5 MG tablet Commonly known as: NORVASC TAKE 1 TABLET (5 MG TOTAL) BY MOUTH DAILY.   aspirin EC 81 MG tablet Take 1 tablet (81 mg total) by mouth daily. Swallow whole.   atorvastatin 40 MG tablet Commonly known as: LIPITOR Take 1 tablet (40 mg total) by mouth daily. What changed:  medication strength how much to take   BD Eclipse Syringe/Needle 25G X 5/8" 3 ML Misc Generic drug: SYRINGE-NEEDLE (DISP) 3 ML Use as directed once a week   cephALEXin 500 MG capsule Commonly known as: KEFLEX Take 1 capsule (500 mg total) by mouth every 12 (twelve) hours for 4 days.   clopidogrel 75 MG tablet Commonly known as: PLAVIX Take 1 tablet (75 mg total) by mouth daily for 21 days.   cyanocobalamin 1000 MCG/ML injection Commonly known as: VITAMIN B12 INJECT 1 ML (1,000 MCG TOTAL) INTO THE MUSCLE ONCE A WEEK.   cyclobenzaprine 10 MG tablet Commonly known as: FLEXERIL Take 10 mg by mouth 3 (three) times daily as needed for muscle spasms.   finasteride 5 MG tablet Commonly known as: PROSCAR Take 1 tablet (5 mg total) by mouth daily.   folic acid 1 MG tablet Commonly known as: FOLVITE Take 1 tablet (1 mg total) by mouth daily.   Klor-Con M10 10 MEQ tablet Generic drug: potassium chloride Take 10 mEq by mouth 2 (two) times daily.   loperamide 2 MG capsule Commonly known as: IMODIUM Take 1 capsule (2 mg total) by mouth every 8 (eight) hours as needed for diarrhea or loose stools.   losartan 100 MG tablet Commonly known as: COZAAR TAKE 1 TABLET BY MOUTH EVERY DAY   multivitamin with minerals Tabs tablet Take 1 tablet  by mouth daily.   ondansetron 4 MG tablet Commonly known as: ZOFRAN Take 4 mg by mouth every 8 (eight) hours as needed for nausea or vomiting.   sertraline 100 MG tablet Commonly known as: ZOLOFT TAKE 1 TABLET BY MOUTH EVERY DAY   tamsulosin 0.4 MG Caps capsule Commonly known as: FLOMAX TAKE 1 CAPSULE BY MOUTH EVERY DAY   thiamine 100 MG tablet Commonly known as: Vitamin B-1 Take 1 tablet (100 mg total) by mouth daily.               Discharge Care Instructions  (From admission, onward)           Start     Ordered   05/31/22 0000  Discharge wound care:  Comments: Pressure Injury 05/28/22 Coccyx Medial Stage 1 -  Intact skin with non-blanchable redness of a localized area usually over a bony prominence.   05/31/22 1303            Contact information for follow-up providers     Tat, Eustace Quail, DO. Go on 07/17/2022.   Specialty: Neurology Contact information: [place] [address] [phone]              Contact information for after-discharge care     Destination     [facility] Preferred SNF .   Service: Skilled Nursing Contact information: [place] [phone]                    Discharge Exam: There were no vitals filed for this visit. Vitals:   06/04/22 0428 06/04/22 0739  BP: (!) 172/90 (!) 144/109  Pulse: 89 85  Resp: 16   Temp: 98.3 F (36.8 C) 97.8 F (36.6 C)  SpO2: 94% 95%   Examination: Physical Exam:  Constitutional: WN/WD chronically ill appearing Caucasian male in NAD  Respiratory: Diminished to auscultation bilaterally, no wheezing, rales, rhonchi or crackles. Normal respiratory effort and patient is not tachypenic. No accessory muscle use. Unlabored breathing  Cardiovascular: RRR, no murmurs / rubs / gallops. S1 and S2 auscultated. No extremity edema. Abdomen: Soft, non-tender, non-distended. Bowel sounds positive.   GU: Deferred. Foley in place Musculoskeletal: No clubbing / cyanosis of digits/nails. No joint deformity upper and lower extremities.  Skin: No rashes, lesions, ulcers on a limited skin evaluation. No induration; Warm and dry.  Neurologic: CN 2-12 grossly intact with no focal deficits. Romberg sign and cerebellar reflexes not assessed.  Psychiatric: Normal judgment and insight. Alert and oriented x 3. Normal mood and appropriate affect.   Condition at discharge: stable  The results of significant diagnostics from this hospitalization (including imaging, microbiology, ancillary and laboratory) are listed below for reference.   Imaging Studies: ECHOCARDIOGRAM COMPLETE  Result Date: 05/28/2022    ECHOCARDIOGRAM REPORT   Patient Name:   Calvin [name] Date of Exam: 05/28/2022 Medical Rec #:  [id number]      Height:       72.0 in Accession #:    [id number]     Weight:       220.0 lb Date of Birth:  [birth year]      BSA:          2.219 m Patient Age:    86 years       BP:           143/103 mmHg Patient Gender: M              HR:           107 bpm. Exam Location:  Inpatient Procedure: 2D Echo Indications:    stroke  History:        Patient has prior history of Echocardiogram examinations, most                 recent 07/20/2021. Risk Factors:Hypertension.  Sonographer:    Johny Chess RDCS Referring Phys: [id number] Candace Gallus MELVIN  Sonographer Comments: Technically difficult study due to poor echo windows. Image acquisition challenging due to respiratory motion. IMPRESSIONS  1. Left ventricular ejection fraction, by estimation, is 60 to 65%. The left ventricle has normal function. The left ventricle has no regional wall motion abnormalities. There is mild concentric  left ventricular hypertrophy. Left ventricular diastolic function could not be evaluated.  2. Right ventricular systolic function is normal. The right ventricular size is normal.  3. The mitral valve is grossly normal. No evidence of mitral  valve regurgitation. No evidence of mitral stenosis.  4. The aortic valve was not well visualized. Aortic valve regurgitation is not visualized. No aortic stenosis is present.  5. Aortic dilatation noted. There is mild dilatation of the ascending aorta, measuring 41 mm.  6. The inferior vena cava is normal in size with greater than 50% respiratory variability, suggesting right atrial pressure of 3 mmHg. Comparison(s): No significant change from prior study. Conclusion(s)/Recommendation(s): No intracardiac source of embolism detected on this transthoracic study. Consider a transesophageal echocardiogram to exclude cardiac source of embolism if clinically indicated. Cannot exclude atrial fibrillation based on  tracings--recommend confirming on telemetry/ECG. FINDINGS  Left Ventricle: Left ventricular ejection fraction, by estimation, is 60 to 65%. The left ventricle has normal function. The left ventricle has no regional wall motion abnormalities. The left ventricular internal cavity size was normal in size. There is  mild concentric left ventricular hypertrophy. Left ventricular diastolic function could not be evaluated. Right Ventricle: The right ventricular size is normal. No increase in right ventricular wall thickness. Right ventricular systolic function is normal. Left Atrium: Left atrial size was normal in size. Right Atrium: Right atrial size was normal in size. Pericardium: There is no evidence of pericardial effusion. Mitral Valve: The mitral valve is grossly normal. No evidence of mitral valve regurgitation. No evidence of mitral valve stenosis. Tricuspid Valve: The tricuspid valve is grossly normal. Tricuspid valve regurgitation is not demonstrated. No evidence of tricuspid stenosis. Aortic Valve: The aortic valve was not well visualized. Aortic valve regurgitation is not visualized. No aortic stenosis is present. Pulmonic Valve: The pulmonic valve was not well visualized. Pulmonic valve regurgitation is  not visualized. Aorta: Aortic dilatation noted. There is mild dilatation of the ascending aorta, measuring 41 mm. Venous: The inferior vena cava is normal in size with greater than 50% respiratory variability, suggesting right atrial pressure of 3 mmHg. IAS/Shunts: The interatrial septum was not well visualized. Additional Comments: There is a small pleural effusion in the left lateral region.  LEFT VENTRICLE PLAX 2D LVIDd:         4.40 cm LVIDs:         2.90 cm LV PW:         1.20 cm LV IVS:        1.30 cm LVOT diam:     2.30 cm LV SV:         66 LV SV Index:   30 LVOT Area:     4.15 cm  IVC IVC diam: 1.80 cm LEFT ATRIUM             Index        RIGHT ATRIUM           Index LA diam:        3.90 cm 1.76 cm/m   RA Area:     14.50 cm LA Vol (A2C):   53.8 ml 24.25 ml/m  RA Volume:   29.70 ml  13.39 ml/m LA Vol (A4C):   42.7 ml 19.25 ml/m LA Biplane Vol: 47.9 ml 21.59 ml/m  AORTIC VALVE LVOT Vmax:   77.10 cm/s LVOT Vmean:  51.400 cm/s LVOT VTI:    0.160 m  AORTA Ao Root diam: 3.50 cm Ao Asc diam:  4.10 cm  SHUNTS Systemic  VTI:  0.16 m Systemic Diam: 2.30 cm Buford Dresser MD Electronically signed by Buford Dresser MD Signature Date/Time: 05/28/2022/2:13:05 PM    Final    CT ANGIO HEAD NECK W WO CM  Result Date: 05/28/2022 CLINICAL DATA:  Stroke workup EXAM: CT ANGIOGRAPHY HEAD AND NECK TECHNIQUE: Multidetector CT imaging of the head and neck was performed using the standard protocol during bolus administration of intravenous contrast. Multiplanar CT image reconstructions and MIPs were obtained to evaluate the vascular anatomy. Carotid stenosis measurements (when applicable) are obtained utilizing NASCET criteria, using the distal internal carotid diameter as the denominator. RADIATION DOSE REDUCTION: This exam was performed according to the departmental dose-optimization program which includes automated exposure control, adjustment of the mA and/or kV according to patient size and/or use of  iterative reconstruction technique. CONTRAST:  3m OMNIPAQUE IOHEXOL 350 MG/ML SOLN COMPARISON:  Brain MRI from yesterday FINDINGS: CT HEAD FINDINGS Brain: Known right pontine stroke. Generalized brain atrophy with ventriculomegaly. Mild chronic small vessel ischemia in the deep white matter. No hemorrhage, mass, or collection. Vascular: No acute finding Skull: No acute finding Sinuses/Orbits: Mucosal thickening in the left sphenoid sinus Review of the MIP images confirms the above findings CTA NECK FINDINGS Aortic arch: Mild atheromatous plaque Right carotid system: Atheromatous wall thickening of the common carotid with heavily calcified plaque at the bifurcation. No ulceration or significant stenosis Left carotid system: Mild calcified plaque at the bifurcation without stenosis or ulceration Vertebral arteries: No proximal subclavian stenosis. Dominant right vertebral artery. Left vertebral origin plaque causes 60% narrowing based on coronal reformats. The left vertebral artery ends in the PICA Skeleton: Ordinary cervical spine degeneration. Other neck: No acute finding Upper chest: Clear apical lungs Review of the MIP images confirms the above findings CTA HEAD FINDINGS Anterior circulation: Atheromatous calcification of the carotid siphons. Intracranial branch atheromatous irregularity that is mild. No flow reducing stenosis. No branch occlusion, beading, or aneurysm Posterior circulation: Atheromatous calcification of the right vertebral artery and basilar without flow reducing stenosis. The left vertebral artery terminates in the left PICA. No branch occlusion, beading, or aneurysm Venous sinuses: Unremarkable for the arterial phase Anatomic variants: None significant Review of the MIP images confirms the above findings IMPRESSION: 1. No emergent finding. 2. Generalized atherosclerosis most notably causing 60% narrowing at the origin of the non dominant left vertebral artery. 3. No irregularity or  significant stenosis at the level of the basilar. Electronically Signed   By: JJorje GuildM.D.   On: 05/28/2022 05:57   MR BRAIN WO CONTRAST  Result Date: 05/27/2022 CLINICAL DATA:  Neuro deficit, acute, stroke suspected. Fell from wheelchair. Slurred speech and left-sided weakness. EXAM: MRI HEAD WITHOUT CONTRAST TECHNIQUE: Multiplanar, multiecho pulse sequences of the brain and surrounding structures were obtained without intravenous contrast. COMPARISON:  Head CT earlier same day FINDINGS: Brain: Diffusion imaging confirms the presence of an acute stroke in the right para median pons. No gross swelling or hemorrhage. No other acute infarction. No focal cerebellar insult. Cerebral hemispheres show generalized atrophy with low moderate chronic small-vessel ischemic changes of the white matter. No supra tentorial large vessel stroke. No mass, hemorrhage, hydrocephalus or extra-axial collection. Vascular: Major vessels at the base of the brain show flow. Skull and upper cervical spine: Negative Sinuses/Orbits: Inflammatory changes of the sphenoid sinus. The other sinuses are clear. Orbits negative. Other: None IMPRESSION: 1. Acute infarction in the right para median pons. No swelling or hemorrhage. 2. Generalized atrophy. Moderate chronic small-vessel ischemic changes of the  cerebral hemispheric white matter. Electronically Signed   By: Nelson Chimes M.D.   On: 05/27/2022 17:16   CT HEAD WO CONTRAST  Result Date: 05/27/2022 CLINICAL DATA:  Multiple neuro deficit, acute, stroke suspected. Mental status change. EXAM: CT HEAD WITHOUT CONTRAST TECHNIQUE: Contiguous axial images were obtained from the base of the skull through the vertex without intravenous contrast. RADIATION DOSE REDUCTION: This exam was performed according to the departmental dose-optimization program which includes automated exposure control, adjustment of the mA and/or kV according to patient size and/or use of iterative reconstruction  technique. COMPARISON:  MRI 07/29/2021 FINDINGS: Brain: Low-density in the right para median pons consistent with stroke. This is of indeterminate age but was not visible in February of this year. No focal cerebellar insult. Cerebral hemispheres show atrophy, ex vacuo enlargement of the lateral ventricles, and chronic small vessel change of the white matter. No large vessel territory stroke is visible. No mass or extra-axial collection. No hemorrhage. Vascular: There is atherosclerotic calcification of the major vessels at the base of the brain. Skull: No skull fracture. Sinuses/Orbits: Chronic inflammatory changes of the sphenoid sinus. Other paranasal sinuses are clear. Mastoid air cells are clear. Orbits negative. Other: None IMPRESSION: 1. Low-density in the right para median pons consistent with stroke. This is of indeterminate age but was not visible in February of this year and therefore could be recent. 2. Atrophy and chronic small-vessel ischemic changes of the white matter. 3. Chronic inflammatory changes of the sphenoid sinus. Electronically Signed   By: Nelson Chimes M.D.   On: 05/27/2022 14:03   DG Chest Portable 1 View  Result Date: 05/27/2022 CLINICAL DATA:  Hypoxia EXAM: PORTABLE CHEST 1 VIEW COMPARISON:  01/19/2016 FINDINGS: Artifact overlies the chest. Mild cardiomegaly. Mild aortic tortuosity. The lungs are clear. No edema, infiltrate, collapse or effusion. IMPRESSION: No active disease. Mild cardiomegaly and aortic tortuosity. Electronically Signed   By: Nelson Chimes M.D.   On: 05/27/2022 12:58    Microbiology: Results for orders placed or performed during the hospital encounter of 05/27/22  Culture, Urine (Do not remove urinary catheter, catheter placed by urology or difficult to place)     Status: Abnormal   Collection Time: 05/27/22 12:00 PM   Specimen: Urine, Catheterized  Result Value Ref Range Status   Specimen Description URINE, CATHETERIZED  Final   Special Requests   Final     NONE Performed at [facility], [address], [place], [place] [address]    Culture >=100,000 COLONIES/mL PROTEUS MIRABILIS (A)  Final   Report Status 05/31/2022 FINAL  Final   Organism ID, Bacteria PROTEUS MIRABILIS (A)  Final      Susceptibility   Proteus mirabilis - MIC*    AMPICILLIN <=2 SENSITIVE Sensitive     CEFAZOLIN <=4 SENSITIVE Sensitive     CEFEPIME <=0.12 SENSITIVE Sensitive     CEFTRIAXONE <=0.25 SENSITIVE Sensitive     CIPROFLOXACIN <=0.25 SENSITIVE Sensitive     GENTAMICIN <=1 SENSITIVE Sensitive     IMIPENEM 2 SENSITIVE Sensitive     NITROFURANTOIN 128 RESISTANT Resistant     TRIMETH/SULFA <=20 SENSITIVE Sensitive     AMPICILLIN/SULBACTAM <=2 SENSITIVE Sensitive     PIP/TAZO <=4 SENSITIVE Sensitive     * >=100,000 COLONIES/mL PROTEUS MIRABILIS  Blood culture (routine x 2)     Status: None   Collection Time: 05/27/22 12:15 PM   Specimen: BLOOD  Result Value Ref Range Status   Specimen Description BLOOD SITE NOT SPECIFIED  Final   Special Requests   Final    BOTTLES DRAWN AEROBIC AND ANAEROBIC Blood Culture adequate volume   Culture   Final    NO GROWTH 5 DAYS Performed at [facility], [address] [place], [place] [address]    Report Status 06/01/2022 FINAL  Final  Blood culture (routine x 2)     Status: None   Collection Time: 05/27/22 12:15 PM   Specimen: BLOOD  Result Value Ref Range Status   Specimen Description BLOOD SITE NOT SPECIFIED  Final   Special Requests   Final    BOTTLES DRAWN AEROBIC AND ANAEROBIC Blood Culture adequate volume   Culture   Final    NO GROWTH 5 DAYS Performed at [facility], [place] [address] [place], [place] [address]    Report Status 06/01/2022 FINAL  Final  Culture, Urine (Do not remove urinary catheter, catheter placed by urology or difficult to place)     Status: Abnormal   Collection Time: 05/28/22  7:41 AM   Specimen: Urine, Catheterized  Result Value Ref Range Status   Specimen Description  URINE, CATHETERIZED  Final   Special Requests   Final    NONE Performed at [facility], [place] [address], [place], [place] [address]    Culture 10,000 COLONIES/mL PROTEUS MIRABILIS (A)  Final   Report Status 05/30/2022 FINAL  Final   Organism ID, Bacteria PROTEUS MIRABILIS (A)  Final      Susceptibility   Proteus mirabilis - MIC*    AMPICILLIN <=2 SENSITIVE Sensitive     CEFAZOLIN <=4 SENSITIVE Sensitive     CEFEPIME <=0.12 SENSITIVE Sensitive     CEFTRIAXONE <=0.25 SENSITIVE Sensitive     CIPROFLOXACIN <=0.25 SENSITIVE Sensitive     GENTAMICIN <=1 SENSITIVE Sensitive     IMIPENEM 2 SENSITIVE Sensitive     NITROFURANTOIN 128 RESISTANT Resistant     TRIMETH/SULFA <=20 SENSITIVE Sensitive     AMPICILLIN/SULBACTAM <=2 SENSITIVE Sensitive     PIP/TAZO <=4 SENSITIVE Sensitive     * 10,000 COLONIES/mL PROTEUS MIRABILIS  Gastrointestinal Panel by PCR , Stool     Status: None   Collection Time: 05/29/22  7:02 AM   Specimen: Stool  Result Value Ref Range Status   Campylobacter species NOT DETECTED NOT DETECTED Final   Plesimonas shigelloides NOT DETECTED NOT DETECTED Final   Salmonella species NOT DETECTED NOT DETECTED Final   Yersinia enterocolitica NOT DETECTED NOT DETECTED Final   Vibrio species NOT DETECTED NOT DETECTED Final   Vibrio cholerae NOT DETECTED NOT DETECTED Final   Enteroaggregative E coli (EAEC) NOT DETECTED NOT DETECTED Final   Enteropathogenic E coli (EPEC) NOT DETECTED NOT DETECTED Final   Enterotoxigenic E coli (ETEC) NOT DETECTED NOT DETECTED Final   Shiga like toxin producing E coli (STEC) NOT DETECTED NOT DETECTED Final   Shigella/Enteroinvasive E coli (EIEC) NOT DETECTED NOT DETECTED Final   Cryptosporidium NOT DETECTED NOT DETECTED Final   Cyclospora cayetanensis NOT DETECTED NOT DETECTED Final   Entamoeba histolytica NOT DETECTED NOT DETECTED Final   Giardia lamblia NOT DETECTED NOT DETECTED Final   Adenovirus F40/41 NOT DETECTED NOT DETECTED Final    Astrovirus NOT DETECTED NOT DETECTED Final   Norovirus GI/GII NOT DETECTED NOT DETECTED Final   Rotavirus A NOT DETECTED NOT DETECTED Final   Sapovirus (I, II, IV, and V) NOT DETECTED NOT DETECTED Final    Comment: Performed at [facility], [address] [place],  [place] [address]   Labs: CBC: Recent Labs  Lab 05/29/22 0257 05/30/22 0351 05/31/22 0337  WBC 15.3* 9.3 7.9  NEUTROABS  --   --  5.3  HGB 15.5 14.1 13.1  HCT 42.8 39.5 38.2*  MCV 88.8 91.0 93.9  PLT 331 288 388   Basic Metabolic Panel: Recent Labs  Lab 05/29/22 0257 05/30/22 0351 05/31/22 0337 06/03/22 0743  NA 133* 134* 138  --   K 3.5 3.6 3.5  --   CL 95* 103 107  --   CO2 23 20* 21*  --   GLUCOSE 154* 100* 93  --   BUN '17 17 16  '$ --   CREATININE 0.86 0.74 0.79 0.72  CALCIUM 9.6 9.0 8.7*  --   MG 1.8 2.0 1.8  --   PHOS  --  3.5 3.4  --    Liver Function Tests: Recent Labs  Lab 05/31/22 0337  AST 27  ALT 19  ALKPHOS 67  BILITOT 0.7  PROT 6.3*  ALBUMIN 3.1*   CBG: No results for input(s): "GLUCAP" in the last 168 hours.  Discharge time spent: greater than 30 minutes.  Signed: Raiford Noble, DO [facility] 06/04/2022

## 2022-06-01 NOTE — Consult Note (Signed)
   [facility] Inpatient Consult   06/01/2022  Calvin [name] December 10, 1945 [id number]  [facility] [ACO] Patient: Calvin [name]  Primary Care Provider:  Laurey Morale, MD   Patient screened for hospitalization to assess for potential [place] [facility] service needs for post hospital transition for care coordination.  Review of patient's electronic medical record reveals patient is being recommended for a skilled nursing facility as reviewed of PT/OT/ST.  Barriers to transition:  Patient will need insurance authorization for SNF.  Plan:  Continue to follow progress and disposition to assess for post hospital community care coordination/management needs.  Referral request for community care coordination:  pending disposition  Of note, [place] does not replace or interfere with any arrangements made by the Inpatient Transition of Care team.  For questions contact:   Natividad Brood, RN BSN [place]  [phone] business mobile phone Toll free office [phone]  *[place]  [phone] Fax number: [phone] [email or web address]

## 2022-06-01 NOTE — Evaluation (Signed)
Speech Language Pathology Evaluation Patient Details Name: Calvin [name] [name]: [id number] DOB: 11/25/1945 Today's Date: 06/01/2022 Time: 4098-1191 SLP Time Calculation (min) (ACUTE ONLY): 30 min  Problem List:  Patient Active Problem List   Diagnosis Date Noted   CVA (cerebral vascular accident) ([place]) 05/29/2022   Pressure injury of skin 05/29/2022   Acute CVA (cerebrovascular accident) ([place]) 05/27/2022   UTI (urinary tract infection) 05/27/2022   Indwelling Foley catheter present 05/27/2022   Bladder outflow obstruction 03/18/2022   B12 deficiency 02/06/2022   Bilateral leg weakness 01/10/2022   Depression with anxiety 12/25/2021   Alcohol abuse 12/25/2021   Primary osteoarthritis of left knee 04/23/2018   Hypogonadism in male 01/19/2016   Foot swelling 01/19/2016   Insomnia 10/30/2013   INTERNAL HEMORRHOIDS WITH OTHER COMPLICATION 47/82/9562   Enlarged prostate 10/25/2008   Headache 09/15/2007   Essential hypertension 07/07/2007   Past Medical History:  Past Medical History:  Diagnosis Date   Benign prostatic hypertrophy    COVID-19 virus infection 02/24/2020   ED (erectile dysfunction)    Headache(784.0)    Hypertension    Hypogonadism male    Low back pain 06/09/2020   Nephrolithiasis    hx of had lithotripsy in 1-10.   NEPHROLITHIASIS, HX OF 10/25/2008   Qualifier: Diagnosis of   By: Sarajane Jews MD, Ishmael Holter      Pyelonephritis 02/25/2022   Past Surgical History:  Past Surgical History:  Procedure Laterality Date   colonoscopy  10/05/2020   per Dr. Fuller Plan, adenomatous polyps, repeat in 3 yrs   [place]  03/12/2012   Procedure: APPENDECTOMY LAPAROSCOPIC;  Surgeon: Harl Bowie, MD;  Location: [facility];  Service: General;  Laterality: N/A;   STAPLE HEMORRHOIDECTOMY  04/14/2010   per Dr. Johney Maine    TONSILLECTOMY     HPI:  Calvin [name] is a 76 y.o. male who presented via EMS to [facility] ED on 12/10 with confusion, weakness. MR brain  without contrast with acute infarction right paramedial pons, no swelling or hemorrhage, generalized atrophy and moderate chronic small vessel ischemic changes of the cerebral hemispheric white matter. Pt with past medical history significant for chronic bladder outlet obstruction/BPH with chronic Foley catheter, history of recurrent UTI, nephrolithiasis, HTN, anxiety/depression, EtOH use disorder, left lower extremity stress fracture currently utilizing a cam walker boot   Assessment / Plan / Recommendation Clinical Impression  Pt presents with mild cognitive-linguistic impairments which appear to be compounded by hearing loss.  Pt was assessed using the COGNISTAT (see below for additional information).  Unfortunately, this therapist had to keep facemask in place during today's assessment d/t recent COVID-19 infection, which exacerbated difficulties from hearing loss without visual cues to assist in comprehension.  Pt was noted to improve with tasks where SLP provided longer instruction and he could use context cues to repair hearing breakdowns versus single word or shorter targets. SLP used written prompts to assist in some tasks where appropriate.  Pt exhibited deficits in attention (digit span), word recall registration, language comprehension, and calculation.  Language comprehension is suspected to be an artifact from hearing deficits and possibly some interference of attention.  Pt was able to participate in higher level conversation with SLP without apparent difficulty aside from requesting repetition at times.  Pt's required 8 trials to meet registration criteria for word recall task and SLP supported registration trials with writing; however, pt recalled all targets with only category cues on 2 items and performed within  the average range. For this reason, hearing impairment is again suspected to be related to performance.  Pt exhibited difficulty with calculations, which he stated should have been  easy for him.  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  Pt benefited from having problem broken in to steps to solve.  He was noted to repeat problem to himself over and over and appeared to have difficulty attending to task. Pt required repetition with digit span task at 5 digits and even with repetition was unable to recall targets. It is unclear if hearing impairments were affecting peformance, but pt did not state that he "didn't catch" prompt as he did at other times when repetition resolved communication breakdowns.  Pt appears to have some awareness of deficits.  He exhibited mild frustration at times and was able to verbalize when something was more difficult for him in the moment after initially denying any changes prior to adminstration of assessment.  Pt is agreeable to therapy and would benefit from ST intervention to address the above related deficits in the acute setting and at next level of care.  Pt's speech is clear without dysarthria noted. Picture description was complete and grammatically correct. Pt was able to participate in conversation without any apparent word finding difficulties and performed within average range on expressive language tasks. Receptive deficits, as noted above, are suspected to be 2/2 hearing impairments.  COGNISTAT: All subtests are within the average range, except where otherwise specified.  Orientation:  11/12 Attention: 4/8, mild impairment Comprehension: 3/6, moderate impairment Repetition: 11/12 Naming: 8/8 Construction: not assessed Memory: 10/12 Calculations: 2/4, mild impairment Similarities: 8/8 Judgment: 5/6      SLP Assessment  SLP Recommendation/Assessment: Patient needs continued Speech Lanaguage Pathology Services SLP Visit Diagnosis: Cognitive communication deficit (R41.841)    Recommendations for follow up therapy are one component of a multi-disciplinary discharge planning  process, led by the attending physician.  Recommendations may be updated based on patient status, additional functional criteria and insurance authorization.    Follow Up Recommendations   (Continue ST at next level of care)    Assistance Recommended at Discharge  Intermittent Supervision/Assistance  Functional Status Assessment Patient has had a recent decline in their functional status and demonstrates the ability to make significant improvements in function in a reasonable and predictable amount of time.  Frequency and Duration min 2x/week  2 weeks      SLP Evaluation Cognition  Overall Cognitive Status: Impaired/Different from baseline Arousal/Alertness: Awake/alert Orientation Level: Oriented X4 Year: 2023 Month: December Day of Week: Correct Attention: Focused;Sustained Focused Attention: Impaired Focused Attention Impairment: Verbal basic Sustained Attention: Impaired Sustained Attention Impairment: Functional basic;Verbal basic Memory: Appears intact Awareness: Appears intact Problem Solving: Impaired       Comprehension  Auditory Comprehension Overall Auditory Comprehension: Appears within functional limits for tasks assessed Interfering Components: Hearing Visual Recognition/Discrimination Discrimination: Not tested Reading Comprehension Reading Status: Not tested    Expression Expression Primary Mode of Expression: Verbal Verbal Expression Overall Verbal Expression: Appears within functional limits for tasks assessed Repetition: No impairment Naming: No impairment Written Expression Dominant Hand: Right Written Expression: Not tested   Oral / Motor  Motor Speech Overall Motor Speech: Appears within functional limits for tasks assessed Respiration: Within functional limits Phonation: Normal Resonance: Within functional limits Articulation: Within functional limitis Intelligibility: Intelligible Motor Planning: Witnin functional limits Motor Speech  Errors: Not applicable  Celedonio Savage, [place], [place] Office: [phone] 06/01/2022, 12:01 PM

## 2022-06-02 DIAGNOSIS — N320 Bladder-neck obstruction: Secondary | ICD-10-CM | POA: Diagnosis not present

## 2022-06-02 DIAGNOSIS — Z978 Presence of other specified devices: Secondary | ICD-10-CM | POA: Diagnosis not present

## 2022-06-02 DIAGNOSIS — I639 Cerebral infarction, unspecified: Secondary | ICD-10-CM | POA: Diagnosis not present

## 2022-06-02 DIAGNOSIS — N400 Enlarged prostate without lower urinary tract symptoms: Secondary | ICD-10-CM | POA: Diagnosis not present

## 2022-06-02 NOTE — Progress Notes (Signed)
PROGRESS NOTE    Calvin [name]  QAS:[id number] DOB: [birth year] DOA: 05/27/2022 PCP: Laurey Morale, MD   Brief Narrative:  Calvin [name] is a 76 y.o. male with past medical history significant for chronic bladder outlet obstruction/BPH with chronic Foley catheter, history of recurrent UTI, nephrolithiasis, HTN, anxiety/depression, EtOH use disorder, left lower extremity stress fracture currently utilizing a cam walker boot who presented via EMS to [facility] ED on 12/10 with confusion, weakness.  Patient lives alone but has a neighbor who has been helping him out more frequently.  Last known normal was night prior to admission.  On EMS arrival, patient was found sitting on the floor near his wheelchair with suspicion that he slid out of the wheelchair with unknown downtime.  He was minimally responsive to first and there was initial concern for hypotension and hypoxia.  Upon EMS standing him up his symptoms somewhat improved and he was transported to the ED for further evaluation.  Patient denied fever, no chills, no chest pain, no abdominal pain, no nausea/vomiting/diarrhea.   In the ED, temperature 97.8 F, HR 90, RR 16, BP 113/76, SpO2 96% on room air.  Sodium 141, potassium 3.4, chloride 106, CO2 23, glucose 107, BUN 9, creatinine 0.79.  Magnesium 1.9, AST 23, ALT 19, total bilirubin 0.6.  Lactic acid 2.1.  WBC 8.3, hemoglobin 13.9, platelets 283.  INR 1.1.  Urinalysis with large leukocytes, positive nitrite, few bacteria, 21-50 WBCs.  UDS negative.  CT head without contrast with low-density right paramedian pons consistent with CVA, atrophy and chronic small vessel ischemic changes of the white matter.  CT angiogram head/neck with no emergent finding, generalized atherosclerosis most notably causing 60% narrowing at the origin of the nondominant left vertebral artery.  MR brain without contrast with acute infarction right paramedial pons, no swelling or hemorrhage, generalized atrophy and moderate chronic  small vessel ischemic changes of the cerebral hemispheric white matter.  Neurology was consulted.  Hospitalist service consulted for evaluation management of acute CVA and UTI.   **Interim History Continues to appear fatigued and lethargic.  PT OT still recommending SNF and he continues to not void properly still had to have his Foley catheter reinserted. He is improving clinically along with his labs and is medically stable to D/C to SNF when bed is available.    Assessment and Plan:  Acute Ischemic CVA -Presenting to the ED via EMS after being found down in his home by his neighbor on the floor with unknown downtime.   -Last known normal was day prior to admission.   -MR brain with acute infarction right paramedial pons.   -CT angiogram head/neck with no emergent finding.  -LDL 92.   -Hemoglobin A1c 5.5.   -TTE with LVEF 60 to 65%, mild concentric LVH, no LV regional wall motion normalities, no aortic stenosis, aortic dilation ascending aorta measuring 41 mm, IVC normal in size.   -UDS Negative  -Was followed by neurology initially and now signed off. -C/w Atorvastatin 40 mg p.o. daily given that LDL is not at goal -C/w DAPT with Aspirin 81 mg p.o. daily, Plavix 75 mg p.o. daily x 21 days followed by aspirin alone indefinitely  -Outpatient follow-up with [facility], Dr. Carles Collet on 07/17/2021. -PT OT recommending SNF and TOC assisting with discharge disposition and he is agreeable to SNF and is medically stable to D/C and awaiting Insurance Auth    Proteus Urinary tract infection -Complicated by this requirement of chronic Foley catheter use.   -  Urinalysis showed a cloudy appearance with moderate hemoglobin, large leukocytes, positive nitrites, few bacteria, 11-20 RBCs per high-power field, 21-50 WBCs -Follows with urology outpatient.   -Review of previous urine cultures all notable for E. coli and only resistance to Ampicillin. -WBC went from 10.3 -> 15.3 -> 9.3 -> 7.9 on last check   -Blood cultures x 2: No growth to Date at 5 days  -Urine culture: Showing Pan-sensitive Proteus 10,000 colony-forming units and is only resistant to Macrobid; Prior Urine Cx showed >100,000 CFU of GNR that was also Proteus  -C/w Ceftriaxone 1 g IV every 24 hours while hospitalized and change po closer to D/C to complete course    Hypokalemia -Repleted during the hospitalization. -Improved and K+ went from 3.5 -> 3.6 -> 3.5 on last check  -Continue to Monitor and Trend -Repeat CMP within 1 week    History of bladder outlet obstruction/BPH with chronic Foley catheter -Patient with chronic Foley catheter in place on admission, developed obstruction during hospitalization and was exchanged on 05/29/2022 with 18 French catheter. -Continue Foley catheter (Had to be replaced) -C/w Tamsulosin 0.4 mg p.o. daily -C/w Finasteride 5 mg p.o. daily -Continue outpatient follow-up with urology -Continue to monitor urinary output, bladder scan as needed   Essential Hypertension:  -Home medication include Norvasc 5 mg p.o. daily, losartan '100mg'$  p.o. daily. -Was Holding antihypertensives to allow permissive hypertension in the first 24hours following acute CVA up to 220/120. -Continue to monitor BP closely and last BP reading is 155/103   Anxiety/Depression: -C/w Zoloft 100 mg p.o. daily   Left lower extremity stress fracture -Patient follows with orthopedics outpatient, currently utilizing a cam walker boot for ambulation. -Weightbearing as tolerates left lower extremity -Continue outpatient follow-up with orthopedics   EtOH Use Disorder/intoxication/withdrawal -EtOH level elevated to 14 on admission.   -Counseled on need for complete cessation -CIWA protocol   Hyponatremia -Patient's Na+ went from 143 -> 133 -> 134 -> 138 on last check  -Continue to Monitor and Trend -Repeat CMP within 1 week    Metabolic Acidosis -Patient's CO2 is now 21, AG is 10, Chloride Level is 107 on last check   -Continue to Monitor and Trend -Repeat CMP within 1 week    Hypoalbuminemia -Patient's Albumin Level is now 3.1 on last check  -Continue to Monitor and Trend -Repeat CMP within 1 week    Diarrhea/Loose Stools -Unlikely C Diff so cancelled Testing -GI Pathogen panel Negative   Weakness/debility/deconditioning: -PT/OT recommend SNF placement, TOC for evaluation and discussing Options with Patient anjd he is medically stable to D/C  DVT prophylaxis: enoxaparin (LOVENOX) injection 40 mg Start: 05/27/22 2200    Code Status: Full Code Family Communication: No family present at bedside   Disposition Plan:  Level of care: Telemetry Medical Status is: Inpatient Remains inpatient appropriate because: Awaiting insurance authorization as he is medically stable   Consultants:  None  Procedures:  As delineated as above  Antimicrobials:  Anti-infectives (From admission, onward)    Start     Dose/Rate Route Frequency Ordered Stop   05/31/22 1415  cephALEXin (KEFLEX) capsule 500 mg        500 mg Oral Every 12 hours 05/31/22 1316 06/04/22 0959   05/31/22 0000  cephALEXin (KEFLEX) 500 MG capsule        500 mg Oral Every 12 hours 05/31/22 1327 06/04/22 2359   05/28/22 0400  cefTRIAXone (ROCEPHIN) 1 g in sodium chloride 0.9 % 100 mL IVPB  Status:  Discontinued  1 g 200 mL/hr over 30 Minutes Intravenous Every 24 hours 05/27/22 1827 05/31/22 2006   05/27/22 1715  ceFEPIme (MAXIPIME) 2 g in sodium chloride 0.9 % 100 mL IVPB        2 g 200 mL/hr over 30 Minutes Intravenous  Once 05/27/22 1707 05/27/22 1846       Subjective: Seen and examined at bedside and he states that he is doing okay but still fatigued.  No nausea or vomiting.  Still waiting to be discharged to SNF. Had no real acute issues.  Objective: Vitals:   06/01/22 2350 06/02/22 0020 06/02/22 0340 06/02/22 0420  BP: (!) 162/121 (!) 172/122 (!) 163/122 (!) 155/103  Pulse:      Resp: (!) '22 16 19 16  '$ Temp:  98.4 F  (36.9 C) 98.9 F (37.2 C)   TempSrc:  Oral Oral   SpO2:  97%      Intake/Output Summary (Last 24 hours) at 06/02/2022 1154 Last data filed at 06/02/2022 0550 Gross per 24 hour  Intake 1091.18 ml  Output 5100 ml  Net -4008.82 ml   There were no vitals filed for this visit.  Examination: Physical Exam:  Constitutional: WN/WD elderly chronically ill-appearing Caucasian male currently no acute distress Respiratory: Diminished to auscultation bilaterally, no wheezing, rales, rhonchi or crackles. Normal respiratory effort and patient is not tachypenic. No accessory muscle use.  Unlabored breathing Cardiovascular: RRR, no murmurs / rubs / gallops. S1 and S2 auscultated. No appreciable extremity edema.  Abdomen: Soft, non-tender, non-distended. Bowel sounds positive.  GU: Deferred. Foley Catheter in place  Musculoskeletal: No clubbing / cyanosis of digits/nails. No joint deformity upper and lower extremities. Skin: No rashes, lesions, ulcers. No induration; Warm and dry.  Neurologic: CN 2-12 grossly intact with no focal deficits. Romberg sign and cerebellar reflexes not assessed.  Psychiatric: Normal judgment and insight. Alert and oriented x 3. Normal mood and appropriate affect.   Data Reviewed: I have personally reviewed following labs and imaging studies  CBC: Recent Labs  Lab 05/27/22 1215 05/27/22 1222 05/28/22 0250 05/29/22 0257 05/30/22 0351 05/31/22 0337  WBC 8.3  --  10.3 15.3* 9.3 7.9  NEUTROABS 5.4  --   --   --   --  5.3  HGB 13.9 13.6 14.4 15.5 14.1 13.1  HCT 40.5 40.0 40.9 42.8 39.5 38.2*  MCV 94.4  --  92.5 88.8 91.0 93.9  PLT 283  --  279 331 288 623   Basic Metabolic Panel: Recent Labs  Lab 05/27/22 1215 05/27/22 1222 05/29/22 0257 05/30/22 0351 05/31/22 0337  NA 141 143 133* 134* 138  K 3.4* 3.5 3.5 3.6 3.5  CL 106 105 95* 103 107  CO2 23  --  23 20* 21*  GLUCOSE 107* 105* 154* 100* 93  BUN '9 9 17 17 16  '$ CREATININE 0.79 1.00 0.86 0.74 0.79   CALCIUM 9.0  --  9.6 9.0 8.7*  MG 1.9  --  1.8 2.0 1.8  PHOS  --   --   --  3.5 3.4   GFR: CrCl cannot be calculated (Unknown ideal weight.). Liver Function Tests: Recent Labs  Lab 05/27/22 1215 05/31/22 0337  AST 23 27  ALT 19 19  ALKPHOS 88 67  BILITOT 0.6 0.7  PROT 7.1 6.3*  ALBUMIN 3.6 3.1*   No results for input(s): "LIPASE", "AMYLASE" in the last 168 hours. No results for input(s): "AMMONIA" in the last 168 hours. Coagulation Profile: Recent Labs  Lab 05/27/22 1215  INR 1.1   Cardiac Enzymes: Recent Labs  Lab 05/27/22 1215  CKTOTAL 49   BNP (last 3 results) No results for input(s): "PROBNP" in the last 8760 hours. HbA1C: No results for input(s): "HGBA1C" in the last 72 hours. CBG: No results for input(s): "GLUCAP" in the last 168 hours. Lipid Profile: No results for input(s): "CHOL", "HDL", "LDLCALC", "TRIG", "CHOLHDL", "LDLDIRECT" in the last 72 hours. Thyroid Function Tests: No results for input(s): "TSH", "T4TOTAL", "FREET4", "T3FREE", "THYROIDAB" in the last 72 hours. Anemia Panel: No results for input(s): "VITAMINB12", "FOLATE", "FERRITIN", "TIBC", "IRON", "RETICCTPCT" in the last 72 hours. Sepsis Labs: Recent Labs  Lab 05/27/22 1215 05/27/22 1535  LATICACIDVEN 2.1* 2.1*    Recent Results (from the past 240 hour(s))  Culture, Urine (Do not remove urinary catheter, catheter placed by urology or difficult to place)     Status: Abnormal   Collection Time: 05/27/22 12:00 PM   Specimen: Urine, Catheterized  Result Value Ref Range Status   Specimen Description URINE, CATHETERIZED  Final   Special Requests   Final    NONE Performed at [facility], [place] [address] [place], [place] [address]    Culture >=100,000 COLONIES/mL PROTEUS MIRABILIS (A)  Final   Report Status 05/31/2022 FINAL  Final   Organism ID, Bacteria PROTEUS MIRABILIS (A)  Final      Susceptibility   Proteus mirabilis - MIC*    AMPICILLIN <=2 SENSITIVE Sensitive      CEFAZOLIN <=4 SENSITIVE Sensitive     CEFEPIME <=0.12 SENSITIVE Sensitive     CEFTRIAXONE <=0.25 SENSITIVE Sensitive     CIPROFLOXACIN <=0.25 SENSITIVE Sensitive     GENTAMICIN <=1 SENSITIVE Sensitive     IMIPENEM 2 SENSITIVE Sensitive     NITROFURANTOIN 128 RESISTANT Resistant     TRIMETH/SULFA <=20 SENSITIVE Sensitive     AMPICILLIN/SULBACTAM <=2 SENSITIVE Sensitive     PIP/TAZO <=4 SENSITIVE Sensitive     * >=100,000 COLONIES/mL PROTEUS MIRABILIS  Blood culture (routine x 2)     Status: None   Collection Time: 05/27/22 12:15 PM   Specimen: BLOOD  Result Value Ref Range Status   Specimen Description BLOOD SITE NOT SPECIFIED  Final   Special Requests   Final    BOTTLES DRAWN AEROBIC AND ANAEROBIC Blood Culture adequate volume   Culture   Final    NO GROWTH 5 DAYS Performed at [facility], [address] [place], [place] [address]    Report Status 06/01/2022 FINAL  Final  Blood culture (routine x 2)     Status: None   Collection Time: 05/27/22 12:15 PM   Specimen: BLOOD  Result Value Ref Range Status   Specimen Description BLOOD SITE NOT SPECIFIED  Final   Special Requests   Final    BOTTLES DRAWN AEROBIC AND ANAEROBIC Blood Culture adequate volume   Culture   Final    NO GROWTH 5 DAYS Performed at [facility], [place] [address], [place], [place] [address]    Report Status 06/01/2022 FINAL  Final  Culture, Urine (Do not remove urinary catheter, catheter placed by urology or difficult to place)     Status: Abnormal   Collection Time: 05/28/22  7:41 AM   Specimen: Urine, Catheterized  Result Value Ref Range Status   Specimen Description URINE, CATHETERIZED  Final   Special Requests   Final    NONE Performed at [facility], [place] [address] [place], [place] [address]  Culture 10,000 COLONIES/mL PROTEUS MIRABILIS (A)  Final   Report Status 05/30/2022 FINAL  Final   Organism ID, Bacteria PROTEUS MIRABILIS (A)  Final      Susceptibility   Proteus  mirabilis - MIC*    AMPICILLIN <=2 SENSITIVE Sensitive     CEFAZOLIN <=4 SENSITIVE Sensitive     CEFEPIME <=0.12 SENSITIVE Sensitive     CEFTRIAXONE <=0.25 SENSITIVE Sensitive     CIPROFLOXACIN <=0.25 SENSITIVE Sensitive     GENTAMICIN <=1 SENSITIVE Sensitive     IMIPENEM 2 SENSITIVE Sensitive     NITROFURANTOIN 128 RESISTANT Resistant     TRIMETH/SULFA <=20 SENSITIVE Sensitive     AMPICILLIN/SULBACTAM <=2 SENSITIVE Sensitive     PIP/TAZO <=4 SENSITIVE Sensitive     * 10,000 COLONIES/mL PROTEUS MIRABILIS  Gastrointestinal Panel by PCR , Stool     Status: None   Collection Time: 05/29/22  7:02 AM   Specimen: Stool  Result Value Ref Range Status   Campylobacter species NOT DETECTED NOT DETECTED Final   Plesimonas shigelloides NOT DETECTED NOT DETECTED Final   Salmonella species NOT DETECTED NOT DETECTED Final   Yersinia enterocolitica NOT DETECTED NOT DETECTED Final   Vibrio species NOT DETECTED NOT DETECTED Final   Vibrio cholerae NOT DETECTED NOT DETECTED Final   Enteroaggregative E coli (EAEC) NOT DETECTED NOT DETECTED Final   Enteropathogenic E coli (EPEC) NOT DETECTED NOT DETECTED Final   Enterotoxigenic E coli (ETEC) NOT DETECTED NOT DETECTED Final   Shiga like toxin producing E coli (STEC) NOT DETECTED NOT DETECTED Final   Shigella/Enteroinvasive E coli (EIEC) NOT DETECTED NOT DETECTED Final   Cryptosporidium NOT DETECTED NOT DETECTED Final   Cyclospora cayetanensis NOT DETECTED NOT DETECTED Final   Entamoeba histolytica NOT DETECTED NOT DETECTED Final   Giardia lamblia NOT DETECTED NOT DETECTED Final   Adenovirus F40/41 NOT DETECTED NOT DETECTED Final   Astrovirus NOT DETECTED NOT DETECTED Final   Norovirus GI/GII NOT DETECTED NOT DETECTED Final   Rotavirus A NOT DETECTED NOT DETECTED Final   Sapovirus (I, II, IV, and V) NOT DETECTED NOT DETECTED Final    Comment: Performed at [facility], [address], [place], [place] [address]    Radiology Studies: No  results found.  Scheduled Meds  aspirin EC  81 mg Oral Daily   atorvastatin  40 mg Oral Daily   cephALEXin  500 mg Oral Q12H   Chlorhexidine Gluconate Cloth  6 each Topical Daily   clopidogrel  75 mg Oral Daily   enoxaparin (LOVENOX) injection  40 mg Subcutaneous Q24H   finasteride  5 mg Oral Daily   folic acid  1 mg Oral Daily   multivitamin with minerals  1 tablet Oral Daily   sertraline  100 mg Oral Daily   tamsulosin  0.4 mg Oral Daily   thiamine  100 mg Oral Daily   Or   thiamine  100 mg Intravenous Daily   Continuous Infusions:   LOS: 4 days   Raiford Noble, DO [facility] Available via Epic secure chat 7am-7pm After these hours, please refer to coverage provider listed on [email or web address] 06/02/2022, 11:54 AM

## 2022-06-03 DIAGNOSIS — N400 Enlarged prostate without lower urinary tract symptoms: Secondary | ICD-10-CM | POA: Diagnosis not present

## 2022-06-03 DIAGNOSIS — N320 Bladder-neck obstruction: Secondary | ICD-10-CM | POA: Diagnosis not present

## 2022-06-03 DIAGNOSIS — I639 Cerebral infarction, unspecified: Secondary | ICD-10-CM | POA: Diagnosis not present

## 2022-06-03 DIAGNOSIS — Z978 Presence of other specified devices: Secondary | ICD-10-CM | POA: Diagnosis not present

## 2022-06-03 LAB — CREATININE, SERUM
Creatinine, Ser: 0.72 mg/dL (ref 0.61–1.24)
GFR, Estimated: >60 mL/min (ref >60)

## 2022-06-03 NOTE — Progress Notes (Signed)
PROGRESS NOTE    Calvin [name]  HQP:[id number] DOB: 05-18-1946 DOA: 05/27/2022 PCP: Laurey Morale, MD   Brief Narrative:  Calvin [name] is a 76 y.o. male with past medical history significant for chronic bladder outlet obstruction/BPH with chronic Foley catheter, history of recurrent UTI, nephrolithiasis, HTN, anxiety/depression, EtOH use disorder, left lower extremity stress fracture currently utilizing a cam walker boot who presented via EMS to [facility] ED on 12/10 with confusion, weakness.  Patient lives alone but has a neighbor who has been helping him out more frequently.  Last known normal was night prior to admission.  On EMS arrival, patient was found sitting on the floor near his wheelchair with suspicion that he slid out of the wheelchair with unknown downtime.  He was minimally responsive to first and there was initial concern for hypotension and hypoxia.  Upon EMS standing him up his symptoms somewhat improved and he was transported to the ED for further evaluation.  Patient denied fever, no chills, no chest pain, no abdominal pain, no nausea/vomiting/diarrhea.   In the ED, temperature 97.8 F, HR 90, RR 16, BP 113/76, SpO2 96% on room air.  Sodium 141, potassium 3.4, chloride 106, CO2 23, glucose 107, BUN 9, creatinine 0.79.  Magnesium 1.9, AST 23, ALT 19, total bilirubin 0.6.  Lactic acid 2.1.  WBC 8.3, hemoglobin 13.9, platelets 283.  INR 1.1.  Urinalysis with large leukocytes, positive nitrite, few bacteria, 21-50 WBCs.  UDS negative.  CT head without contrast with low-density right paramedian pons consistent with CVA, atrophy and chronic small vessel ischemic changes of the white matter.  CT angiogram head/neck with no emergent finding, generalized atherosclerosis most notably causing 60% narrowing at the origin of the nondominant left vertebral artery.  MR brain without contrast with acute infarction right paramedial pons, no swelling or hemorrhage, generalized atrophy and moderate chronic  small vessel ischemic changes of the cerebral hemispheric white matter.  Neurology was consulted.  Hospitalist service consulted for evaluation management of acute CVA and UTI.   **Interim History Continues to appear fatigued and lethargic.  PT OT still recommending SNF and he continues to not void properly still had to have his Foley catheter reinserted. He is improving clinically along with his labs and is medically stable to D/C to SNF when bed is available.   Assessment and Plan:  Acute Ischemic CVA -Presenting to the ED via EMS after being found down in his home by his neighbor on the floor with unknown downtime.   -Last known normal was day prior to admission.   -MR brain with acute infarction right paramedial pons.   -CT angiogram head/neck with no emergent finding.  -LDL 92.   -Hemoglobin A1c 5.5.   -TTE with LVEF 60 to 65%, mild concentric LVH, no LV regional wall motion normalities, no aortic stenosis, aortic dilation ascending aorta measuring 41 mm, IVC normal in size.   -UDS Negative  -Was followed by neurology initially and now signed off. -C/w Atorvastatin 40 mg p.o. daily given that LDL is not at goal -C/w DAPT with Aspirin 81 mg p.o. daily, Plavix 75 mg p.o. daily x 21 days followed by aspirin alone indefinitely  -Outpatient follow-up with [facility], Dr. Carles Collet on 07/17/2021. -PT OT recommending SNF and TOC assisting with discharge disposition and he is agreeable to SNF and is medically stable to D/C and awaiting Insurance Auth    Proteus Urinary tract infection -Complicated by this requirement of chronic Foley catheter use.   -Urinalysis  showed a cloudy appearance with moderate hemoglobin, large leukocytes, positive nitrites, few bacteria, 11-20 RBCs per high-power field, 21-50 WBCs -Follows with urology outpatient.   -Review of previous urine cultures all notable for E. coli and only resistance to Ampicillin. -WBC went from 10.3 -> 15.3 -> 9.3 -> 7.9 on last check   -Blood cultures x 2: No growth to Date at 5 days  -Urine culture: Showing Pan-sensitive Proteus 10,000 colony-forming units and is only resistant to Macrobid; Prior Urine Cx showed >100,000 CFU of GNR that was also Proteus  -C/w Ceftriaxone 1 g IV every 24 hours while hospitalized and change po closer to D/C to complete course    Hypokalemia -Repleted during the hospitalization. -Improved and K+ went from 3.5 -> 3.6 -> 3.5 on last check  -Continue to Monitor and Trend -Repeat CMP within 1 week    History of bladder outlet obstruction/BPH with chronic Foley catheter -Patient with chronic Foley catheter in place on admission, developed obstruction during hospitalization and was exchanged on 05/29/2022 with 18 French catheter. -Continue Foley catheter (Had to be replaced) -C/w Tamsulosin 0.4 mg p.o. daily -C/w Finasteride 5 mg p.o. daily -Continue outpatient follow-up with urology -Continue to monitor urinary output, bladder scan as needed   Essential Hypertension:  -Home medication include Norvasc 5 mg p.o. daily, losartan '100mg'$  p.o. daily. -Was Holding antihypertensives to allow permissive hypertension in the first 24hours following acute CVA up to 220/120. -Continue to monitor BP closely and last BP reading is improved to 120/75   Anxiety/Depression: -C/w Zoloft 100 mg p.o. daily   Left lower extremity stress fracture -Patient follows with orthopedics outpatient, currently utilizing a cam walker boot for ambulation. CAM boot at bedside  -Weightbearing as tolerates left lower extremity -Continue outpatient follow-up with orthopedics   EtOH Use Disorder/intoxication/withdrawal -EtOH level elevated to 14 on admission.   -Counseled on need for complete cessation -CIWA protocol was initiated but he remains Stable   Hyponatremia -Patient's Na+ went from 143 -> 133 -> 134 -> 138 on last check  -Continue to Monitor and Trend -Repeat CMP within 1 week    Metabolic  Acidosis -Patient's CO2 is now 21, AG is 10, Chloride Level is 107 on last check  -Continue to Monitor and Trend -Repeat CMP within 1 week    Hypoalbuminemia -Patient's Albumin Level is now 3.1 on last check  -Continue to Monitor and Trend -Repeat CMP within 1 week    Diarrhea/Loose Stools -Unlikely C Diff so cancelled Testing given stool sample consistency  -GI Pathogen panel Negative -Continue to Monitor    Weakness/debility/deconditioning: -PT/OT recommend SNF placement, TOC for evaluation and discussing Options with Patient anjd he is medically stable to D/C but awaiting Insurance Auth  DVT prophylaxis: enoxaparin (LOVENOX) injection 40 mg Start: 05/27/22 2200    Code Status: Full Code Family Communication: No family present at bedside   Disposition Plan:  Level of care: Telemetry Medical Status is: Inpatient Remains inpatient appropriate because: Awaiting insurance authorization as he is medically stable     Consultants:  None  Procedures:  As delineated as above   Antimicrobials:  Anti-infectives (From admission, onward)    Start     Dose/Rate Route Frequency Ordered Stop   05/31/22 1415  cephALEXin (KEFLEX) capsule 500 mg        500 mg Oral Every 12 hours 05/31/22 1316 06/04/22 0959   05/31/22 0000  cephALEXin (KEFLEX) 500 MG capsule        500 mg Oral Every  12 hours 05/31/22 1327 06/04/22 2359   05/28/22 0400  cefTRIAXone (ROCEPHIN) 1 g in sodium chloride 0.9 % 100 mL IVPB  Status:  Discontinued        1 g 200 mL/hr over 30 Minutes Intravenous Every 24 hours 05/27/22 1827 05/31/22 2006   05/27/22 1715  ceFEPIme (MAXIPIME) 2 g in sodium chloride 0.9 % 100 mL IVPB        2 g 200 mL/hr over 30 Minutes Intravenous  Once 05/27/22 1707 05/27/22 1846       Subjective: Seen and examined at bedside and he states he is doing ok. States when he leaves here he will need to get new glasses and hearing aids. No CP or SOB. Denies any other concerns or complaints at this  time and remains medically stable to D/C to SNF.   Objective: Vitals:   06/02/22 2026 06/02/22 2345 06/03/22 0411 06/03/22 0818  BP: (!) 155/94 (!) 145/88 (!) 136/98 120/75  Pulse: 83  88 73  Resp: '17 16 19   '$ Temp: 98.2 F (36.8 C) 98.2 F (36.8 C) 98.2 F (36.8 C) 98 F (36.7 C)  TempSrc: Oral Oral Oral Oral  SpO2: 97% 97% 96% 93%    Intake/Output Summary (Last 24 hours) at 06/03/2022 1111 Last data filed at 06/03/2022 1052 Gross per 24 hour  Intake --  Output 2900 ml  Net -2900 ml   There were no vitals filed for this visit.  Examination: Physical Exam:  Constitutional: WN/Wd elderly chronically ill-appearing Caucasian male in NAD Respiratory: Diminished to auscultation bilaterally, no wheezing, rales, rhonchi or crackles. Normal respiratory effort and patient is not tachypenic. No accessory muscle use. Unlabored breathing  Cardiovascular: RRR, no murmurs / rubs / gallops. S1 and S2 auscultated. No extremity edema.  Abdomen: Soft, non-tender, non-distended. Bowel sounds positive.  GU: Deferred. Foley catheter in place Musculoskeletal: No clubbing / cyanosis of digits/nails. No joint deformity upper and lower extremities.  Skin: No rashes, lesions, ulcers on a limited skin evaluation. No induration; Warm and dry.  Neurologic: CN 2-12 grossly intact with no focal deficits. Romberg sign and cerebellar reflexes not assessed.  Psychiatric: Normal judgment and insight. Alert and oriented x 3. Normal mood and appropriate affect.   Data Reviewed: I have personally reviewed following labs and imaging studies  CBC: Recent Labs  Lab 05/27/22 1215 05/27/22 1222 05/28/22 0250 05/29/22 0257 05/30/22 0351 05/31/22 0337  WBC 8.3  --  10.3 15.3* 9.3 7.9  NEUTROABS 5.4  --   --   --   --  5.3  HGB 13.9 13.6 14.4 15.5 14.1 13.1  HCT 40.5 40.0 40.9 42.8 39.5 38.2*  MCV 94.4  --  92.5 88.8 91.0 93.9  PLT 283  --  279 331 288 706   Basic Metabolic Panel: Recent Labs  Lab  05/27/22 1215 05/27/22 1222 05/29/22 0257 05/30/22 0351 05/31/22 0337 06/03/22 0743  NA 141 143 133* 134* 138  --   K 3.4* 3.5 3.5 3.6 3.5  --   CL 106 105 95* 103 107  --   CO2 23  --  23 20* 21*  --   GLUCOSE 107* 105* 154* 100* 93  --   BUN '9 9 17 17 16  '$ --   CREATININE 0.79 1.00 0.86 0.74 0.79 0.72  CALCIUM 9.0  --  9.6 9.0 8.7*  --   MG 1.9  --  1.8 2.0 1.8  --   PHOS  --   --   --  3.5 3.4  --    GFR: CrCl cannot be calculated (Unknown ideal weight.). Liver Function Tests: Recent Labs  Lab 05/27/22 1215 05/31/22 0337  AST 23 27  ALT 19 19  ALKPHOS 88 67  BILITOT 0.6 0.7  PROT 7.1 6.3*  ALBUMIN 3.6 3.1*   No results for input(s): "LIPASE", "AMYLASE" in the last 168 hours. No results for input(s): "AMMONIA" in the last 168 hours. Coagulation Profile: Recent Labs  Lab 05/27/22 1215  INR 1.1   Cardiac Enzymes: Recent Labs  Lab 05/27/22 1215  CKTOTAL 49   BNP (last 3 results) No results for input(s): "PROBNP" in the last 8760 hours. HbA1C: No results for input(s): "HGBA1C" in the last 72 hours. CBG: No results for input(s): "GLUCAP" in the last 168 hours. Lipid Profile: No results for input(s): "CHOL", "HDL", "LDLCALC", "TRIG", "CHOLHDL", "LDLDIRECT" in the last 72 hours. Thyroid Function Tests: No results for input(s): "TSH", "T4TOTAL", "FREET4", "T3FREE", "THYROIDAB" in the last 72 hours. Anemia Panel: No results for input(s): "VITAMINB12", "FOLATE", "FERRITIN", "TIBC", "IRON", "RETICCTPCT" in the last 72 hours. Sepsis Labs: Recent Labs  Lab 05/27/22 1215 05/27/22 1535  LATICACIDVEN 2.1* 2.1*    Recent Results (from the past 240 hour(s))  Culture, Urine (Do not remove urinary catheter, catheter placed by urology or difficult to place)     Status: Abnormal   Collection Time: 05/27/22 12:00 PM   Specimen: Urine, Catheterized  Result Value Ref Range Status   Specimen Description URINE, CATHETERIZED  Final   Special Requests   Final     NONE Performed at [facility], [place] [address] [place], [place] [address]    Culture >=100,000 COLONIES/mL PROTEUS MIRABILIS (A)  Final   Report Status 05/31/2022 FINAL  Final   Organism ID, Bacteria PROTEUS MIRABILIS (A)  Final      Susceptibility   Proteus mirabilis - MIC*    AMPICILLIN <=2 SENSITIVE Sensitive     CEFAZOLIN <=4 SENSITIVE Sensitive     CEFEPIME <=0.12 SENSITIVE Sensitive     CEFTRIAXONE <=0.25 SENSITIVE Sensitive     CIPROFLOXACIN <=0.25 SENSITIVE Sensitive     GENTAMICIN <=1 SENSITIVE Sensitive     IMIPENEM 2 SENSITIVE Sensitive     NITROFURANTOIN 128 RESISTANT Resistant     TRIMETH/SULFA <=20 SENSITIVE Sensitive     AMPICILLIN/SULBACTAM <=2 SENSITIVE Sensitive     PIP/TAZO <=4 SENSITIVE Sensitive     * >=100,000 COLONIES/mL PROTEUS MIRABILIS  Blood culture (routine x 2)     Status: None   Collection Time: 05/27/22 12:15 PM   Specimen: BLOOD  Result Value Ref Range Status   Specimen Description BLOOD SITE NOT SPECIFIED  Final   Special Requests   Final    BOTTLES DRAWN AEROBIC AND ANAEROBIC Blood Culture adequate volume   Culture   Final    NO GROWTH 5 DAYS Performed at [facility], [address] [place], [place] [address]    Report Status 06/01/2022 FINAL  Final  Blood culture (routine x 2)     Status: None   Collection Time: 05/27/22 12:15 PM   Specimen: BLOOD  Result Value Ref Range Status   Specimen Description BLOOD SITE NOT SPECIFIED  Final   Special Requests   Final    BOTTLES DRAWN AEROBIC AND ANAEROBIC Blood Culture adequate volume   Culture   Final    NO GROWTH 5 DAYS Performed at [facility], [place] [address] [place], [place] [address]  Report Status 06/01/2022 FINAL  Final  Culture, Urine (Do not remove urinary catheter, catheter placed by urology or difficult to place)     Status: Abnormal   Collection Time: 05/28/22  7:41 AM   Specimen: Urine, Catheterized  Result Value Ref Range Status   Specimen Description  URINE, CATHETERIZED  Final   Special Requests   Final    NONE Performed at [facility], [address] [place], [place] [address]    Culture 10,000 COLONIES/mL PROTEUS MIRABILIS (A)  Final   Report Status 05/30/2022 FINAL  Final   Organism ID, Bacteria PROTEUS MIRABILIS (A)  Final      Susceptibility   Proteus mirabilis - MIC*    AMPICILLIN <=2 SENSITIVE Sensitive     CEFAZOLIN <=4 SENSITIVE Sensitive     CEFEPIME <=0.12 SENSITIVE Sensitive     CEFTRIAXONE <=0.25 SENSITIVE Sensitive     CIPROFLOXACIN <=0.25 SENSITIVE Sensitive     GENTAMICIN <=1 SENSITIVE Sensitive     IMIPENEM 2 SENSITIVE Sensitive     NITROFURANTOIN 128 RESISTANT Resistant     TRIMETH/SULFA <=20 SENSITIVE Sensitive     AMPICILLIN/SULBACTAM <=2 SENSITIVE Sensitive     PIP/TAZO <=4 SENSITIVE Sensitive     * 10,000 COLONIES/mL PROTEUS MIRABILIS  Gastrointestinal Panel by PCR , Stool     Status: None   Collection Time: 05/29/22  7:02 AM   Specimen: Stool  Result Value Ref Range Status   Campylobacter species NOT DETECTED NOT DETECTED Final   Plesimonas shigelloides NOT DETECTED NOT DETECTED Final   Salmonella species NOT DETECTED NOT DETECTED Final   Yersinia enterocolitica NOT DETECTED NOT DETECTED Final   Vibrio species NOT DETECTED NOT DETECTED Final   Vibrio cholerae NOT DETECTED NOT DETECTED Final   Enteroaggregative E coli (EAEC) NOT DETECTED NOT DETECTED Final   Enteropathogenic E coli (EPEC) NOT DETECTED NOT DETECTED Final   Enterotoxigenic E coli (ETEC) NOT DETECTED NOT DETECTED Final   Shiga like toxin producing E coli (STEC) NOT DETECTED NOT DETECTED Final   Shigella/Enteroinvasive E coli (EIEC) NOT DETECTED NOT DETECTED Final   Cryptosporidium NOT DETECTED NOT DETECTED Final   Cyclospora cayetanensis NOT DETECTED NOT DETECTED Final   Entamoeba histolytica NOT DETECTED NOT DETECTED Final   Giardia lamblia NOT DETECTED NOT DETECTED Final   Adenovirus F40/41 NOT DETECTED NOT DETECTED Final    Astrovirus NOT DETECTED NOT DETECTED Final   Norovirus GI/GII NOT DETECTED NOT DETECTED Final   Rotavirus A NOT DETECTED NOT DETECTED Final   Sapovirus (I, II, IV, and V) NOT DETECTED NOT DETECTED Final    Comment: Performed at [facility], [address], [place], [place] [address]    Radiology Studies: No results found.  Scheduled Meds:  aspirin EC  81 mg Oral Daily   atorvastatin  40 mg Oral Daily   cephALEXin  500 mg Oral Q12H   Chlorhexidine Gluconate Cloth  6 each Topical Daily   clopidogrel  75 mg Oral Daily   enoxaparin (LOVENOX) injection  40 mg Subcutaneous Q24H   finasteride  5 mg Oral Daily   folic acid  1 mg Oral Daily   multivitamin with minerals  1 tablet Oral Daily   sertraline  100 mg Oral Daily   tamsulosin  0.4 mg Oral Daily   thiamine  100 mg Oral Daily   Or   thiamine  100 mg Intravenous Daily    LOS: 5 days   Raiford Noble, DO [facility] Available via  Epic secure chat 7am-7pm After these hours, please refer to coverage provider listed on [email or web address] 06/03/2022, 11:11 AM

## 2022-06-04 ENCOUNTER — Ambulatory Visit: Payer: Medicare HMO | Admitting: Neurology

## 2022-06-04 DIAGNOSIS — S82832S Other fracture of upper and lower end of left fibula, sequela: Secondary | ICD-10-CM | POA: Diagnosis not present

## 2022-06-04 DIAGNOSIS — M8430XA Stress fracture, unspecified site, initial encounter for fracture: Secondary | ICD-10-CM | POA: Diagnosis not present

## 2022-06-04 DIAGNOSIS — Z8616 Personal history of COVID-19: Secondary | ICD-10-CM | POA: Diagnosis not present

## 2022-06-04 DIAGNOSIS — M6281 Muscle weakness (generalized): Secondary | ICD-10-CM | POA: Diagnosis not present

## 2022-06-04 DIAGNOSIS — M62838 Other muscle spasm: Secondary | ICD-10-CM | POA: Diagnosis not present

## 2022-06-04 DIAGNOSIS — Z79899 Other long term (current) drug therapy: Secondary | ICD-10-CM | POA: Diagnosis not present

## 2022-06-04 DIAGNOSIS — R262 Difficulty in walking, not elsewhere classified: Secondary | ICD-10-CM | POA: Diagnosis not present

## 2022-06-04 DIAGNOSIS — R279 Unspecified lack of coordination: Secondary | ICD-10-CM | POA: Diagnosis not present

## 2022-06-04 DIAGNOSIS — Z978 Presence of other specified devices: Secondary | ICD-10-CM | POA: Diagnosis not present

## 2022-06-04 DIAGNOSIS — Z743 Need for continuous supervision: Secondary | ICD-10-CM | POA: Diagnosis not present

## 2022-06-04 DIAGNOSIS — N400 Enlarged prostate without lower urinary tract symptoms: Secondary | ICD-10-CM | POA: Diagnosis not present

## 2022-06-04 DIAGNOSIS — W19XXXD Unspecified fall, subsequent encounter: Secondary | ICD-10-CM | POA: Diagnosis not present

## 2022-06-04 DIAGNOSIS — E871 Hypo-osmolality and hyponatremia: Secondary | ICD-10-CM | POA: Diagnosis not present

## 2022-06-04 DIAGNOSIS — Z0000 Encounter for general adult medical examination without abnormal findings: Secondary | ICD-10-CM | POA: Diagnosis not present

## 2022-06-04 DIAGNOSIS — E538 Deficiency of other specified B group vitamins: Secondary | ICD-10-CM | POA: Diagnosis not present

## 2022-06-04 DIAGNOSIS — R296 Repeated falls: Secondary | ICD-10-CM | POA: Diagnosis not present

## 2022-06-04 DIAGNOSIS — R41841 Cognitive communication deficit: Secondary | ICD-10-CM | POA: Diagnosis not present

## 2022-06-04 DIAGNOSIS — N3289 Other specified disorders of bladder: Secondary | ICD-10-CM | POA: Diagnosis not present

## 2022-06-04 DIAGNOSIS — N281 Cyst of kidney, acquired: Secondary | ICD-10-CM | POA: Diagnosis not present

## 2022-06-04 DIAGNOSIS — R5383 Other fatigue: Secondary | ICD-10-CM | POA: Diagnosis not present

## 2022-06-04 DIAGNOSIS — I639 Cerebral infarction, unspecified: Secondary | ICD-10-CM | POA: Diagnosis not present

## 2022-06-04 DIAGNOSIS — E119 Type 2 diabetes mellitus without complications: Secondary | ICD-10-CM | POA: Diagnosis not present

## 2022-06-04 DIAGNOSIS — Z9181 History of falling: Secondary | ICD-10-CM | POA: Diagnosis not present

## 2022-06-04 DIAGNOSIS — R4182 Altered mental status, unspecified: Secondary | ICD-10-CM | POA: Diagnosis not present

## 2022-06-04 DIAGNOSIS — F418 Other specified anxiety disorders: Secondary | ICD-10-CM | POA: Diagnosis not present

## 2022-06-04 DIAGNOSIS — R319 Hematuria, unspecified: Secondary | ICD-10-CM | POA: Diagnosis present

## 2022-06-04 DIAGNOSIS — N320 Bladder-neck obstruction: Secondary | ICD-10-CM | POA: Diagnosis not present

## 2022-06-04 DIAGNOSIS — N3001 Acute cystitis with hematuria: Secondary | ICD-10-CM | POA: Diagnosis not present

## 2022-06-04 DIAGNOSIS — E876 Hypokalemia: Secondary | ICD-10-CM | POA: Diagnosis not present

## 2022-06-04 DIAGNOSIS — M84372D Stress fracture, left ankle, subsequent encounter for fracture with routine healing: Secondary | ICD-10-CM | POA: Diagnosis not present

## 2022-06-04 DIAGNOSIS — N1330 Unspecified hydronephrosis: Secondary | ICD-10-CM | POA: Diagnosis not present

## 2022-06-04 DIAGNOSIS — I10 Essential (primary) hypertension: Secondary | ICD-10-CM | POA: Diagnosis not present

## 2022-06-04 DIAGNOSIS — Z8673 Personal history of transient ischemic attack (TIA), and cerebral infarction without residual deficits: Secondary | ICD-10-CM | POA: Diagnosis not present

## 2022-06-04 DIAGNOSIS — F419 Anxiety disorder, unspecified: Secondary | ICD-10-CM | POA: Diagnosis not present

## 2022-06-04 DIAGNOSIS — Z7982 Long term (current) use of aspirin: Secondary | ICD-10-CM | POA: Diagnosis not present

## 2022-06-04 DIAGNOSIS — Z7401 Bed confinement status: Secondary | ICD-10-CM | POA: Diagnosis not present

## 2022-06-04 DIAGNOSIS — E8721 Acute metabolic acidosis: Secondary | ICD-10-CM | POA: Diagnosis not present

## 2022-06-04 DIAGNOSIS — F32A Depression, unspecified: Secondary | ICD-10-CM | POA: Diagnosis not present

## 2022-06-04 DIAGNOSIS — R110 Nausea: Secondary | ICD-10-CM | POA: Diagnosis not present

## 2022-06-04 DIAGNOSIS — R5381 Other malaise: Secondary | ICD-10-CM | POA: Diagnosis not present

## 2022-06-04 DIAGNOSIS — F1010 Alcohol abuse, uncomplicated: Secondary | ICD-10-CM | POA: Diagnosis not present

## 2022-06-04 DIAGNOSIS — R69 Illness, unspecified: Secondary | ICD-10-CM | POA: Diagnosis not present

## 2022-06-04 DIAGNOSIS — R339 Retention of urine, unspecified: Secondary | ICD-10-CM | POA: Diagnosis not present

## 2022-06-04 DIAGNOSIS — M25572 Pain in left ankle and joints of left foot: Secondary | ICD-10-CM | POA: Diagnosis not present

## 2022-06-04 DIAGNOSIS — R531 Weakness: Secondary | ICD-10-CM | POA: Diagnosis not present

## 2022-06-04 DIAGNOSIS — N390 Urinary tract infection, site not specified: Secondary | ICD-10-CM | POA: Diagnosis not present

## 2022-06-04 DIAGNOSIS — S8265XA Nondisplaced fracture of lateral malleolus of left fibula, initial encounter for closed fracture: Secondary | ICD-10-CM | POA: Diagnosis not present

## 2022-06-04 DIAGNOSIS — Z7189 Other specified counseling: Secondary | ICD-10-CM | POA: Diagnosis not present

## 2022-06-04 DIAGNOSIS — K7689 Other specified diseases of liver: Secondary | ICD-10-CM | POA: Diagnosis not present

## 2022-06-04 DIAGNOSIS — F1020 Alcohol dependence, uncomplicated: Secondary | ICD-10-CM | POA: Diagnosis not present

## 2022-06-04 DIAGNOSIS — R2681 Unsteadiness on feet: Secondary | ICD-10-CM | POA: Diagnosis not present

## 2022-06-04 NOTE — Care Management Important Message (Signed)
Important Message  Patient Details  Name: Calvin [name] [name]: [id number] Date of Birth: 11-11-1945   Medicare Important Message Given:  Yes     Orbie Pyo 06/04/2022, 10:16 AM

## 2022-06-04 NOTE — Care Management Important Message (Signed)
Important Message  Patient Details  Name: Calvin [name] [name]: [id number] Date of Birth: 27-Nov-1945   Medicare Important Message Given:  Yes     Orbie Pyo 06/04/2022, 10:16 AM

## 2022-06-04 NOTE — Plan of Care (Signed)
Adequate for discharge to [facility]

## 2022-06-04 NOTE — TOC Transition Note (Signed)
Transition of Care [facility]) - CM/SW Discharge Note   Patient Details  Name: Calvin [name] [name]: [id number] Date of Birth: 06-16-1946  Transition of [facility] CM/SW Contact:  Jinger Neighbors, LCSW Phone Number: 06/04/2022, 10:27 AM   Clinical Narrative:     PT going to [facility] via [employer] Call to report number: [phone]...ask for nurse on [facility]. Pt going to room 57A  Final next level of care: [place] Barriers to Discharge: No Barriers Identified   Patient Goals and CMS Choice Patient states their goals for this hospitalization and ongoing recovery are:: Patient report he would like to get stronger and have more than one hour of PT per day. CMS Medicare.gov Compare Post Acute Care list provided to:: Patient Choice offered to / list presented to : Patient, Adult Children  Discharge Placement              Patient chooses bed at: Other - please specify in the comment section below: ([facility]) Patient to be transferred to facility by: [place] Name of family member notified: Karma Ganja Patient and family notified of of transfer: 06/04/22  Discharge Plan and Services                                     Social Determinants of Health (SDOH) Interventions     Readmission Risk Interventions    03/19/2022    2:17 PM 02/27/2022    2:30 PM  Readmission Risk Prevention Plan  Post Dischage Appt Complete Complete  Medication Screening Complete Complete  Transportation Screening Complete Complete

## 2022-06-04 NOTE — Telephone Encounter (Signed)
Close encounter 

## 2022-06-08 ENCOUNTER — Encounter: Payer: Self-pay | Admitting: Family Medicine

## 2022-06-08 ENCOUNTER — Ambulatory Visit (INDEPENDENT_AMBULATORY_CARE_PROVIDER_SITE_OTHER): Payer: Medicare HMO | Admitting: Family Medicine

## 2022-06-08 VITALS — BP 120/78 | HR 87 | Temp 98.1°F | Wt 221.0 lb

## 2022-06-08 DIAGNOSIS — F1010 Alcohol abuse, uncomplicated: Secondary | ICD-10-CM

## 2022-06-08 DIAGNOSIS — F418 Other specified anxiety disorders: Secondary | ICD-10-CM | POA: Diagnosis not present

## 2022-06-08 DIAGNOSIS — Z8673 Personal history of transient ischemic attack (TIA), and cerebral infarction without residual deficits: Secondary | ICD-10-CM

## 2022-06-08 DIAGNOSIS — I639 Cerebral infarction, unspecified: Secondary | ICD-10-CM

## 2022-06-08 DIAGNOSIS — Z978 Presence of other specified devices: Secondary | ICD-10-CM | POA: Diagnosis not present

## 2022-06-08 DIAGNOSIS — N320 Bladder-neck obstruction: Secondary | ICD-10-CM | POA: Diagnosis not present

## 2022-06-08 DIAGNOSIS — R69 Illness, unspecified: Secondary | ICD-10-CM | POA: Diagnosis not present

## 2022-06-08 DIAGNOSIS — E538 Deficiency of other specified B group vitamins: Secondary | ICD-10-CM | POA: Diagnosis not present

## 2022-06-08 DIAGNOSIS — N390 Urinary tract infection, site not specified: Secondary | ICD-10-CM | POA: Diagnosis not present

## 2022-06-08 DIAGNOSIS — I10 Essential (primary) hypertension: Secondary | ICD-10-CM | POA: Diagnosis not present

## 2022-06-08 LAB — CBC WITH DIFFERENTIAL/PLATELET
Basophils Absolute: 0.1 10*3/uL (ref 0.0–0.1)
Basophils Relative: 0.7 % (ref 0.0–3.0)
Eosinophils Absolute: 0.4 10*3/uL (ref 0.0–0.7)
Eosinophils Relative: 4.1 % (ref 0.0–5.0)
HCT: 42.2 % (ref 39.0–52.0)
Hemoglobin: 14.5 g/dL (ref 13.0–17.0)
Lymphocytes Relative: 21.3 % (ref 12.0–46.0)
Lymphs Abs: 2 10*3/uL (ref 0.7–4.0)
MCHC: 34.4 g/dL (ref 30.0–36.0)
MCV: 94 fl (ref 78.0–100.0)
Monocytes Absolute: 0.8 10*3/uL (ref 0.1–1.0)
Monocytes Relative: 9.1 % (ref 3.0–12.0)
Neutro Abs: 6 10*3/uL (ref 1.4–7.7)
Neutrophils Relative %: 64.8 % (ref 43.0–77.0)
Platelets: 318 10*3/uL (ref 150.0–400.0)
RBC: 4.49 Mil/uL (ref 4.22–5.81)
RDW: 14.9 % (ref 11.5–15.5)
WBC: 9.2 10*3/uL (ref 4.0–10.5)

## 2022-06-08 LAB — BASIC METABOLIC PANEL
BUN: 13 mg/dL (ref 6–23)
CO2: 29 mEq/L (ref 19–32)
Calcium: 9.8 mg/dL (ref 8.4–10.5)
Chloride: 101 mEq/L (ref 96–112)
Creatinine, Ser: 0.72 mg/dL (ref 0.40–1.50)
GFR: 89.74 mL/min (ref >60.00)
Glucose, Bld: 95 mg/dL (ref 70–99)
Potassium: 4.1 mEq/L (ref 3.5–5.1)
Sodium: 138 mEq/L (ref 135–145)

## 2022-06-08 LAB — HEPATIC FUNCTION PANEL
ALT: 15 U/L (ref 0–53)
AST: 13 U/L (ref 0–37)
Albumin: 4.1 g/dL (ref 3.5–5.2)
Alkaline Phosphatase: 93 U/L (ref 39–117)
Bilirubin, Direct: 0.1 mg/dL (ref 0.0–0.3)
Total Bilirubin: 0.5 mg/dL (ref 0.2–1.2)
Total Protein: 7.1 g/dL (ref 6.0–8.3)

## 2022-06-08 LAB — PHOSPHORUS: Phosphorus: 3.8 mg/dL (ref 2.3–4.6)

## 2022-06-08 LAB — MAGNESIUM: Magnesium: 1.8 mg/dL (ref 1.5–2.5)

## 2022-06-08 NOTE — Progress Notes (Signed)
   Subjective:    Patient ID: Calvin [name], male    DOB: 1945-09-03, 76 y.o.   [name]: [id number]  HPI Here to follow up a hospital stay from 05-27-22 to 56-38-75 for complications of an acute ischemic strike to the right pons area. He was found sitting on his floor after apparently had slid out of his wheelchair. He has had a Foley catheter in place for several months due to a bladder outlet obstruction. He sees Dr. Arnette Schaumann at [facility] for this. He last saw them on 04-27-22. He has been wearing a Cam boot on the left lower leg for a stress fracture. He sees an Secondary school teacher in [place] for this. At the hospital his kidney function was normal, and his creatinine on arrival was 0.9. He was found to have a UTI, and the culture grew E coli and Proteus. This was treated with Rocephin, and he was sent out with no further antibiotics. He was stable neurologically. An MRI revealed the small acute CVA in the right paramedian pons. A CTA of the head and neck showed no significant stenoses. He was sent out on ASA 81 mg daily and Plavix daily for 21 days. Today he says he feels fine.    Review of Systems  Constitutional: Negative.   Respiratory: Negative.    Cardiovascular: Negative.   Gastrointestinal: Negative.   Genitourinary:  Negative for flank pain and hematuria.  Neurological: Negative.        Objective:   Physical Exam Constitutional:      Comments: In a wheelchair   Cardiovascular:     Rate and Rhythm: Normal rate and regular rhythm.     Pulses: Normal pulses.     Heart sounds: Normal heart sounds.  Pulmonary:     Effort: Pulmonary effort is normal.     Breath sounds: Normal breath sounds.  Abdominal:     General: Abdomen is flat. Bowel sounds are normal. There is no distension.     Palpations: Abdomen is soft. There is no mass.     Tenderness: There is no abdominal tenderness. There is no right CVA tenderness, left CVA tenderness, guarding or rebound.     Hernia: No  hernia is present.  Neurological:     Mental Status: He is alert and oriented to person, place, and time.     Cranial Nerves: No cranial nerve deficit.  Psychiatric:        Mood and Affect: Mood normal.           Assessment & Plan:  He is recovering from a right pons CVA and a UTI. He seems to be doing well. He says he is happy at [facility], the SNF where he is now living. He is scheduled for a neurology follow up with Dr. Carles Collet on 07-17-22. He does not know if he has a follow up appt with [facility] or not. He does not remember the name of the orthopedist who is taking care of his stress fracture, and he does not know if he has a follow up appt or not. We will draw a CBC, BMET. Mg, and Ph today. We will attempt to find out about his follow up appts with orthopedics and urology. We spent a total of (35   ) minutes reviewing records and discussing these issues.  Alysia Penna, MD

## 2022-06-09 LAB — VITAMIN B12: Vitamin B-12: 596 pg/mL (ref 211–911)

## 2022-06-15 DIAGNOSIS — S8265XA Nondisplaced fracture of lateral malleolus of left fibula, initial encounter for closed fracture: Secondary | ICD-10-CM | POA: Diagnosis not present

## 2022-06-15 DIAGNOSIS — M25572 Pain in left ankle and joints of left foot: Secondary | ICD-10-CM | POA: Diagnosis not present

## 2022-06-15 DIAGNOSIS — S8262XA Displaced fracture of lateral malleolus of left fibula, initial encounter for closed fracture: Secondary | ICD-10-CM | POA: Insufficient documentation

## 2022-06-20 DIAGNOSIS — R110 Nausea: Secondary | ICD-10-CM | POA: Diagnosis not present

## 2022-06-20 DIAGNOSIS — R339 Retention of urine, unspecified: Secondary | ICD-10-CM | POA: Diagnosis not present

## 2022-06-20 DIAGNOSIS — I10 Essential (primary) hypertension: Secondary | ICD-10-CM | POA: Diagnosis not present

## 2022-06-20 DIAGNOSIS — N320 Bladder-neck obstruction: Secondary | ICD-10-CM | POA: Diagnosis not present

## 2022-06-20 DIAGNOSIS — Z978 Presence of other specified devices: Secondary | ICD-10-CM | POA: Diagnosis not present

## 2022-06-21 ENCOUNTER — Emergency Department (HOSPITAL_COMMUNITY)
Admission: EM | Admit: 2022-06-21 | Discharge: 2022-06-21 | Disposition: A | Discharge status: Home / Self Care | Payer: Medicare HMO | Attending: Emergency Medicine | Admitting: Emergency Medicine

## 2022-06-21 ENCOUNTER — Encounter (HOSPITAL_COMMUNITY): Payer: Self-pay

## 2022-06-21 ENCOUNTER — Other Ambulatory Visit: Payer: Self-pay

## 2022-06-21 ENCOUNTER — Emergency Department (HOSPITAL_COMMUNITY): Payer: Medicare HMO

## 2022-06-21 DIAGNOSIS — N281 Cyst of kidney, acquired: Secondary | ICD-10-CM | POA: Diagnosis not present

## 2022-06-21 DIAGNOSIS — R339 Retention of urine, unspecified: Secondary | ICD-10-CM

## 2022-06-21 DIAGNOSIS — Z79899 Other long term (current) drug therapy: Secondary | ICD-10-CM | POA: Insufficient documentation

## 2022-06-21 DIAGNOSIS — I10 Essential (primary) hypertension: Secondary | ICD-10-CM | POA: Insufficient documentation

## 2022-06-21 DIAGNOSIS — N390 Urinary tract infection, site not specified: Secondary | ICD-10-CM | POA: Insufficient documentation

## 2022-06-21 DIAGNOSIS — R319 Hematuria, unspecified: Secondary | ICD-10-CM | POA: Diagnosis present

## 2022-06-21 DIAGNOSIS — Z7982 Long term (current) use of aspirin: Secondary | ICD-10-CM | POA: Insufficient documentation

## 2022-06-21 DIAGNOSIS — K7689 Other specified diseases of liver: Secondary | ICD-10-CM | POA: Diagnosis not present

## 2022-06-21 DIAGNOSIS — Z8616 Personal history of COVID-19: Secondary | ICD-10-CM | POA: Diagnosis not present

## 2022-06-21 DIAGNOSIS — N1330 Unspecified hydronephrosis: Secondary | ICD-10-CM | POA: Diagnosis not present

## 2022-06-21 DIAGNOSIS — N3289 Other specified disorders of bladder: Secondary | ICD-10-CM | POA: Diagnosis not present

## 2022-06-21 LAB — URINALYSIS, ROUTINE W REFLEX MICROSCOPIC
Bilirubin Urine: NEGATIVE
Glucose, UA: NEGATIVE mg/dL
Ketones, ur: NEGATIVE mg/dL
Nitrite: NEGATIVE
Protein, ur: 100 mg/dL — AB
RBC / HPF: >50 RBC/hpf — ABNORMAL HIGH (ref 0–5)
Specific Gravity, Urine: 1.012 (ref 1.005–1.030)
WBC, UA: >50 WBC/hpf — ABNORMAL HIGH (ref 0–5)
pH: 7 (ref 5.0–8.0)

## 2022-06-21 LAB — COMPREHENSIVE METABOLIC PANEL
ALT: 18 U/L (ref 0–44)
AST: 21 U/L (ref 15–41)
Albumin: 4.3 g/dL (ref 3.5–5.0)
Alkaline Phosphatase: 86 U/L (ref 38–126)
Anion gap: 15 (ref 5–15)
BUN: 22 mg/dL (ref 8–23)
CO2: 20 mmol/L — ABNORMAL LOW (ref 22–32)
Calcium: 9.8 mg/dL (ref 8.9–10.3)
Chloride: 97 mmol/L — ABNORMAL LOW (ref 98–111)
Creatinine, Ser: 1.16 mg/dL (ref 0.61–1.24)
GFR, Estimated: >60 mL/min (ref >60)
Glucose, Bld: 147 mg/dL — ABNORMAL HIGH (ref 70–99)
Potassium: 4.2 mmol/L (ref 3.5–5.1)
Sodium: 132 mmol/L — ABNORMAL LOW (ref 135–145)
Total Bilirubin: 1.4 mg/dL — ABNORMAL HIGH (ref 0.3–1.2)
Total Protein: 7.8 g/dL (ref 6.5–8.1)

## 2022-06-21 LAB — CBC WITH DIFFERENTIAL/PLATELET
Abs Immature Granulocytes: 0.09 10*3/uL — ABNORMAL HIGH (ref 0.00–0.07)
Basophils Absolute: 0 10*3/uL (ref 0.0–0.1)
Basophils Relative: 0 %
Eosinophils Absolute: 0 10*3/uL (ref 0.0–0.5)
Eosinophils Relative: 0 %
HCT: 44.8 % (ref 39.0–52.0)
Hemoglobin: 15.8 g/dL (ref 13.0–17.0)
Immature Granulocytes: 1 %
Lymphocytes Relative: 7 %
Lymphs Abs: 1.2 10*3/uL (ref 0.7–4.0)
MCH: 31.8 pg (ref 26.0–34.0)
MCHC: 35.3 g/dL (ref 30.0–36.0)
MCV: 90.1 fL (ref 80.0–100.0)
Monocytes Absolute: 1.3 10*3/uL — ABNORMAL HIGH (ref 0.1–1.0)
Monocytes Relative: 8 %
Neutro Abs: 15 10*3/uL — ABNORMAL HIGH (ref 1.7–7.7)
Neutrophils Relative %: 84 %
Platelets: 371 10*3/uL (ref 150–400)
RBC: 4.97 MIL/uL (ref 4.22–5.81)
RDW: 13.8 % (ref 11.5–15.5)
WBC: 17.7 10*3/uL — ABNORMAL HIGH (ref 4.0–10.5)
nRBC: 0 % (ref 0.0–0.2)

## 2022-06-21 LAB — LIPASE, BLOOD: Lipase: 31 U/L (ref 11–51)

## 2022-06-21 MED ORDER — SULFAMETHOXAZOLE-TRIMETHOPRIM 800-160 MG PO TABS: 1.0000 | ORAL_TABLET | Freq: Two times a day (BID) | ORAL | 0 refills | Status: AC

## 2022-06-21 MED ORDER — IOHEXOL 300 MG/ML  SOLN
100.0000 mL | Freq: Once | INTRAMUSCULAR | Status: AC | PRN
  Administered 2022-06-21: 100 mL via INTRAVENOUS

## 2022-06-21 MED ORDER — SULFAMETHOXAZOLE-TRIMETHOPRIM 800-160 MG PO TABS
1.0000 | ORAL_TABLET | Freq: Once | ORAL | Status: AC
  Administered 2022-06-21: 1 via ORAL
  Filled 2022-06-21: qty 1

## 2022-06-21 NOTE — Discharge Instructions (Signed)
You presented today with abdominal pain.  Your CAT scan showed that your Foley catheter was not draining the urine from your bladder.  We have replaced her catheter today.  You may continue to see some blood in the urine over the next week but please return with any severe pain.  You were given your first dose of antibiotics for your urinary tract infection today.  This is a twice daily medication, continue taking it as prescribed.  You have an appointment on February 9 with urology.  Please do not miss this appointment.  Call your PCP this week to let them know about your hospital visit and ask for a reevaluation.  It was a pleasure to meet you and we hope you feel better!

## 2022-06-21 NOTE — ED Notes (Signed)
[facility] contacted to transport patient home. States it may be a wait.

## 2022-06-21 NOTE — ED Provider Notes (Signed)
I received this patient in handoff from previous PA, Calvin [name], please see his note for original history and workup thus far.  In short patient is a 77 year old male with an indwelling Foley catheter who presented today with abdominal pain and hematuria in the Foley bag.  CT scan was ordered in triage and was revealing of a distended bladder with bilateral hydronephrosis.  The catheter also appeared to be in the patient's prostate.  At this time nursing is replacing his Foley catheter.  Plan is for me to follow-up with a renal ultrasound and potentially consult urology for further recommendations.   Physical Exam  BP (!) 131/90 (BP Location: Right Arm)   Pulse (!) 110   Temp 97.9 F (36.6 C) (Oral)   Resp 20   Ht 6' (1.829 m)   Wt 100.2 kg   SpO2 95%   BMI 29.97 kg/m   Physical Exam Vitals and nursing note reviewed.  Constitutional:      Appearance: Normal appearance.  HENT:     Head: Normocephalic and atraumatic.  Eyes:     General: No scleral icterus.    Conjunctiva/sclera: Conjunctivae normal.  Pulmonary:     Effort: Pulmonary effort is normal. No respiratory distress.  Abdominal:     General: There is distension.     Palpations: Abdomen is soft.  Skin:    Findings: No rash.  Neurological:     Mental Status: He is alert.  Psychiatric:        Mood and Affect: Mood normal.     Procedures  Procedures  ED Course / MDM    Medical Decision Making Amount and/or Complexity of Data Reviewed Labs: ordered. Radiology: ordered.  Risk Prescription drug management.   4pm: Patient now noted to have pink-tinged urine in his Foley catheter bag and some foggy debris.  No longer has a distended abdomen. Per RN, output is at 3850.  5pm: Renal ultrasound revealing of a resolved left-sided hydronephrosis and a nearly resolved right-sided hydro  Patient's urinalysis is leukocyte positive bacteruria.  Sample has been sent for culture.  Urine culture from last month grew out Proteus  and was susceptible to Bactrim.  Will give first dose of this and send him home with the same medication.  Spoke with Dr. Milford Cage with urology.  [facility] is [facility] and follows with Dr. Claudia Desanctis.  Dr. Milford Cage states that the patient has an appointment on February 9 for urodynamics.  Being that we have replaced his Foley today, follow-up for that appointment can occur as planned.  Patient will be discharged with antibiotics at this time    Calvin [name] 06/21/22 1724    Long, Wonda Olds, MD 06/26/22 [phone]

## 2022-06-21 NOTE — ED Provider Notes (Addendum)
[facility] DEPT Provider Note   CSN: [id number] Arrival date & time: 06/21/22  0857     History  Chief Complaint  Patient presents with   Hematuria   Abdominal Pain   Emesis    Calvin [name] is a 77 y.o. male with a past medical history significant for chronic bladder outlet obstruction/BPH with chronic Foley catheter, recurrent UTI, nephrolithiasis, hypertension, anxiety, EtOH use disorder presenting to the emergency room for evaluation of hematuria.  Patient reports he has seen blood in his urine in the last couple weeks.  He reports his Foley catheter was changed about a month ago at [facility] and since then has been seeing blood and some mild abdominal pain.  He reports nausea and vomiting in the last 2 days.  No constipation or diarrhea.  Denies any blood in his emesis or stools.  No fever chest pain or shortness of breath.   Hematuria Associated symptoms include abdominal pain.  Abdominal Pain Associated symptoms: hematuria and vomiting   Emesis Associated symptoms: abdominal pain     Past Medical History:  Diagnosis Date   Benign prostatic hypertrophy    COVID-19 virus infection 02/24/2020   ED (erectile dysfunction)    Headache(784.0)    Hypertension    Hypogonadism male    Low back pain 06/09/2020   Nephrolithiasis    hx of had lithotripsy in 1-10.   NEPHROLITHIASIS, HX OF 10/25/2008   Qualifier: Diagnosis of   By: Sarajane Jews MD, Ishmael Holter      Pyelonephritis 02/25/2022   Past Surgical History:  Procedure Laterality Date   colonoscopy  10/05/2020   per Dr. Fuller Plan, adenomatous polyps, repeat in 3 yrs   [place]  03/12/2012   Procedure: APPENDECTOMY LAPAROSCOPIC;  Surgeon: Harl Bowie, MD;  Location: [facility];  Service: General;  Laterality: N/A;   STAPLE HEMORRHOIDECTOMY  04/14/2010   per Dr. Johney Maine    TONSILLECTOMY       Home Medications Prior to Admission medications   Medication Sig Start Date  End Date Taking? Authorizing Provider  sulfamethoxazole-trimethoprim (BACTRIM DS) 800-160 MG tablet Take 1 tablet by mouth 2 (two) times daily for 7 days. 06/21/22 06/28/22 Yes Redwine, Madison A, PA-C  amLODipine (NORVASC) 5 MG tablet TAKE 1 TABLET (5 MG TOTAL) BY MOUTH DAILY. 05/03/22   Laurey Morale, MD  aspirin EC 81 MG tablet Take 1 tablet (81 mg total) by mouth daily. Swallow whole. 06/01/22   Raiford Noble Latif, DO  atorvastatin (LIPITOR) 40 MG tablet Take 1 tablet (40 mg total) by mouth daily. 06/01/22   Raiford Noble Latif, DO  clopidogrel (PLAVIX) 75 MG tablet Take 1 tablet (75 mg total) by mouth daily for 21 days. 06/01/22 06/22/22  Raiford Noble Latif, DO  cyanocobalamin (VITAMIN B12) 1000 MCG/ML injection INJECT 1 ML (1,000 MCG TOTAL) INTO THE MUSCLE ONCE A WEEK. 03/13/22   Laurey Morale, MD  cyclobenzaprine (FLEXERIL) 10 MG tablet Take 10 mg by mouth 3 (three) times daily as needed for muscle spasms. 03/04/22   [provider]  finasteride (PROSCAR) 5 MG tablet Take 1 tablet (5 mg total) by mouth daily. 04/25/21   Laurey Morale, MD  folic acid (FOLVITE) 1 MG tablet Take 1 tablet (1 mg total) by mouth daily. 06/01/22   Sheikh, Omair Latif, DO  KLOR-CON M10 10 MEQ tablet Take 10 mEq by mouth 2 (two) times daily. 03/22/22   [provider]  loperamide (IMODIUM) 2 MG capsule Take 1 capsule (2 mg total) by mouth every 8 (eight) hours as needed for diarrhea or loose stools. 03/21/22   Aline August, MD  losartan (COZAAR) 100 MG tablet TAKE 1 TABLET BY MOUTH EVERY DAY 05/03/22   Laurey Morale, MD  Multiple Vitamin (MULTIVITAMIN WITH MINERALS) TABS tablet Take 1 tablet by mouth daily. 06/01/22   Sheikh, Omair Latif, DO  ondansetron (ZOFRAN) 4 MG tablet Take 4 mg by mouth every 8 (eight) hours as needed for nausea or vomiting.    [provider]  sertraline (ZOLOFT) 100 MG tablet TAKE 1 TABLET BY MOUTH EVERY DAY 05/03/22   Laurey Morale, MD  SYRINGE-NEEDLE, DISP, 3 ML (BD  ECLIPSE SYRINGE/NEEDLE) 25G X 5/8" 3 ML MISC Use as directed once a week 12/26/21   Laurey Morale, MD  tamsulosin (FLOMAX) 0.4 MG CAPS capsule TAKE 1 CAPSULE BY MOUTH EVERY DAY 05/03/22   Laurey Morale, MD  thiamine (VITAMIN B-1) 100 MG tablet Take 1 tablet (100 mg total) by mouth daily. 06/01/22   Kerney Elbe, DO      Allergies    Patient has no known allergies.    Review of Systems   Review of Systems  Gastrointestinal:  Positive for abdominal pain and vomiting.  Genitourinary:  Positive for hematuria.    Physical Exam Updated Vital Signs BP (!) 158/93 (BP Location: Left Arm)   Pulse 93   Temp 98 F (36.7 C) (Oral)   Resp 16   Ht 6' (1.829 m)   Wt 100.2 kg   SpO2 98%   BMI 29.97 kg/m  Physical Exam Vitals and nursing note reviewed.  Constitutional:      Appearance: Normal appearance.  HENT:     Head: Normocephalic and atraumatic.     Mouth/Throat:     Mouth: Mucous membranes are moist.  Eyes:     General: No scleral icterus. Cardiovascular:     Rate and Rhythm: Normal rate and regular rhythm.     Pulses: Normal pulses.     Heart sounds: Normal heart sounds.  Pulmonary:     Effort: Pulmonary effort is normal.     Breath sounds: Normal breath sounds.  Abdominal:     Tenderness: There is no abdominal tenderness.     Comments: Abdomen is distended with diffuse tenderness to palpation.  Musculoskeletal:        General: No deformity.  Skin:    General: Skin is warm.     Findings: No rash.  Neurological:     General: No focal deficit present.     Mental Status: He is alert.  Psychiatric:        Mood and Affect: Mood normal.     ED Results / Procedures / Treatments   Labs (all labs ordered are listed, but only abnormal results are displayed) Labs Reviewed  CBC WITH DIFFERENTIAL/PLATELET - Abnormal; Notable for the following components:      Result Value   WBC 17.7 (*)    Neutro Abs 15.0 (*)    Monocytes Absolute 1.3 (*)    Abs Immature  Granulocytes 0.09 (*)    All other components within normal limits  COMPREHENSIVE METABOLIC PANEL - Abnormal; Notable for the following components:   Sodium 132 (*)    Chloride 97 (*)    CO2 20 (*)    Glucose, Bld 147 (*)    Total Bilirubin 1.4 (*)    All other components within normal  limits  URINALYSIS, ROUTINE W REFLEX MICROSCOPIC - Abnormal; Notable for the following components:   APPearance CLOUDY (*)    Hgb urine dipstick LARGE (*)    Protein, ur 100 (*)    Leukocytes,Ua MODERATE (*)    RBC / HPF >50 (*)    WBC, UA >50 (*)    Bacteria, UA MANY (*)    All other components within normal limits  URINE CULTURE  LIPASE, BLOOD    EKG None  Radiology US Renal  Result Date: 06/21/2022 CLINICAL DATA:  "Postvoid hydro." EXAM: RENAL / URINARY TRACT ULTRASOUND COMPLETE COMPARISON:  Abdominopelvic CT earlier today FINDINGS: Right Kidney: Renal measurements: 14.9 x 7.3 x 8.4 cm = volume: 400 mL. Moderate hydronephrosis, with decreased renal collecting system distension from CT earlier today. Pelvis measures 2.1 cm, previously 5.5 cm on CT and my retrospective measurement. No focal lesion or stone. Left Kidney: Renal measurements: 12.7 x 7 x 6 cm = volume: 277 mL. Near completely resolved hydronephrosis from earlier CT with only minimal prominence of the renal collecting system. There is a 2.4 cm cyst in the periphery of the left kidney. No specific imaging follow-up is needed. No suspicious renal lesion. No stone. Bladder: Decompressed by Foley catheter. Other: None. IMPRESSION: 1. Resolved left hydronephrosis from CT earlier today with only minimal residual prominence of the renal collecting system. 2. Improved right hydronephrosis with moderate residual. 3. Foley catheter decompresses the urinary bladder. Electronically Signed   By: Keith Rake M.D.   On: 06/21/2022 16:57   CT ABDOMEN PELVIS W CONTRAST  Result Date: 06/21/2022 CLINICAL DATA:  Acute abdominal pain. Foley in place with  bloody urine. EXAM: CT ABDOMEN AND PELVIS WITH CONTRAST TECHNIQUE: Multidetector CT imaging of the abdomen and pelvis was performed using the standard protocol following bolus administration of intravenous contrast. RADIATION DOSE REDUCTION: This exam was performed according to the departmental dose-optimization program which includes automated exposure control, adjustment of the mA and/or kV according to patient size and/or use of iterative reconstruction technique. CONTRAST:  159m OMNIPAQUE IOHEXOL 300 MG/ML  SOLN COMPARISON:  CT 02/25/2022 and older FINDINGS: Lower chest: Mild linear opacity lung bases likely scar or atelectasis. There is some lower lobe bronchiectasis as well. No pleural effusion. Coronary artery calcifications are seen. Small pericardial effusion. Hepatobiliary: Gallbladder is nondilated. There is some subtle thickening towards the fundus, unchanged from previous. Multiple low-attenuation cystic lesions are seen in liver. The largest in segment 3 measuring up to 2.4 cm. Many are under a cm. This benign configurations and are not significantly changed. No specific imaging follow-up. Pancreas: Slight atrophy of the pancreas. Spleen: Spleen is nonenlarged.  Preserved enhancement. Adrenals/Urinary Tract: Left adrenal gland is preserved. There is a right adrenal nodule identified measuring 2.4 by 1.9 cm. This has been present since at least 2013 vitals CT scan. Slow interval growth with the relatively slow growth this would be consistent with benign lesion. Diameter in 2013 was 15 by 13 mm. Mild bilateral renal atrophy. There is some benign Bosniak 1 cysts in the left kidney pylorus at the upper pole measuring 3.9 cm diameter with Hounsfield units close to 0 these also were seen 2013. Slow interval growth. No specific follow-up. Bosniak 1 lesion. There is collecting system dilatation of the right kidney, moderate and more mild on the left with perinephric stranding. The bladder is dilated with some  layering high attenuation debris, possible hemorrhage with the patient's history. Subtle mass lesion is not excluded. Dimensions of the bladder  are 16.6 by 17.7 by 22.1 cm. Of note there is a Foley catheter with balloon in the prostate. The prostate is enlarged with mass effect along the bladder. Recommend the Foley the repositioned. Stomach/Bowel: On this non oral contrast exam, large bowel has a normal course and caliber with scattered stool. Few colonic diverticula. Stomach is nondilated. Small bowel is nondilated. No free air. Nonspecific presacral fat stranding. Vascular/Lymphatic: Normal caliber aorta and IVC with some mild atherosclerotic changes. No developing abnormal lymph node enlargement identified in the abdomen and pelvis. Reproductive: Please see above in the adrenal urinary tract section. The prostate is enlarged. There is a Foley catheter with the balloon within the prostatic urethra. Recommend this be repositioned. Other: No abdominal wall hernia or abnormality. No abdominopelvic ascites. Musculoskeletal: Curvature and degenerative changes along the spine and pelvis. Multilevel stenosis. Schmorl's node deformity of the superior endplate of L1. Case discussed with the ordering service by myself at 1:25 p.m. Eastern standard time on 06/21/2022, Rex Kras IMPRESSION: Dilated urinary bladder with some layering dependent high attenuation material, possible hemorrhage with the patient's history although in principal lesion is not excluded. Associated moderate collecting system of the right kidney and mild left. There is an enlarged prostate with a Foley catheter in place but the balloon of the catheter is in the prostatic urethra. Recommend this be repositioned. In addition after draining the bladder recommend follow-up renal ultrasound to confirm resolution of the dilatation of the collecting systems of the kidneys. This dilatation may be passive. Stable hepatic, renal cysts. Benign-appearing right adrenal  nodule which has been present since at least 2013. Electronically Signed   By: Jill Side M.D.   On: 06/21/2022 13:32    Procedures Procedures    Medications Ordered in ED Medications  sulfamethoxazole-trimethoprim (BACTRIM DS) 800-160 MG per tablet 1 tablet (has no administration in time range)  iohexol (OMNIPAQUE) 300 MG/ML solution 100 mL (100 mLs Intravenous Contrast Given 06/21/22 1258)    ED Course/ Medical Decision Making/ A&P                           Medical Decision Making Amount and/or Complexity of Data Reviewed Labs: ordered. Radiology: ordered.  Risk Prescription drug management.   This patient presents to the ED for hematuria and abdominal pain, this involves an extensive number of treatment options, and is a complaint that carries with a high risk of complications and morbidity.  The differential diagnosis includes urinary retention, cystitis, nephrolithiasis, urunary tract obstruction, infectious etiology.  This is not an exhaustive list.  Lab tests: I ordered and personally interpreted labs.  The pertinent results include: WBC 17.7. Hbg unremarkable. Platelets unremarkable. No electrolyte abnormalities noted.  BUN, creatinine unremarkable. UA significant for Hgb, leukocytes, WBC, bacteria.  Imaging studies: I ordered imaging studies. I personally reviewed, interpreted imaging and agree with the radiologist's interpretations. The results include: CT abdomen pelvis showed dilated bladder and there is an enlarged prostate with a Foley catheter in place but the balloon of the catheter is in the prostatic urethra.  Problem list/ ED course/ Critical interventions/ Medical management: HPI: See above Vital signs significant for tachycardic otherwise within normal range and stable throughout visit. Laboratory/imaging studies significant for: See above. On physical examination, patient is afebrile and appears in no acute distress. Patient presents with urinary  retention fir unknown duration. Patient has a history of BPH and chronic Foley catheter which is the likely cause. CT abdomen pelvis showed  dilated bladder and there is an enlarged prostate with a Foley catheter in place but the balloon of the catheter is in the prostatic urethra. A new foley were ordered. Considered other etiologies but given history, exam and workup have low suspicion for cauda equina. UA showed evidence of infectious etiology including pyelonephritis/cystitis. Low suspicion for constipation induced retention, intraabdominal mass, trauma, nephrolithiasis, urolithiasis, drug reaction. Patient care signed out at shift change with pending US Renal post Foley replacement.  I have reviewed the patient home medicines and have made adjustments as needed.  Cardiac monitoring/EKG: The patient was maintained on a cardiac monitor.  I personally reviewed and interpreted the cardiac monitor which showed an underlying rhythm of: sinus rhythm.  Additional history obtained: External records from outside source obtained and reviewed including: Chart review including previous notes, labs, imaging.  Consultations obtained:  Disposition Patient care signed out at shift change with pending US Renal post Foley replacement.   This chart was dictated using voice recognition software.  Despite best efforts to proofread,  errors can occur which can change the documentation meaning.          Final Clinical Impression(s) / ED Diagnoses Final diagnoses:  Urinary retention  Lower urinary tract infectious disease    Rx / DC Orders ED Discharge Orders          Ordered    sulfamethoxazole-trimethoprim (BACTRIM DS) 800-160 MG tablet  2 times daily        06/21/22 1721              Rex Kras, PA 06/21/22 1747    Rex Kras, PA 06/21/22 1749    Elgie Congo, MD 06/21/22 1954

## 2022-06-21 NOTE — ED Provider Triage Note (Signed)
Emergency Medicine Provider Triage Evaluation Note  Calvin [name] , a 77 y.o. male  was evaluated in triage.  Pt complains of abdominal pain and hematuria.  Patient reports he was evaluated at [facility] where they changed his Foley catheter a month ago.  Patient reports he has been seeing blood in his urine and some right lower quadrant abdominal pain.  Reports nausea and vomiting in the last 2 days.  No constipation or diarrhea.  No blood in emesis or stools.  No fever, chest pain or shortness.  Review of Systems  Positive: As above Negative: As above  Physical Exam  BP (!) 158/113   Pulse (!) 116   Temp 98.4 F (36.9 C) (Oral)   Resp 20   Ht 6' (1.829 m)   Wt 100.2 kg   SpO2 97%   BMI 29.97 kg/m  Gen:   Awake, no distress   Resp:  Normal effort  MSK:   Moves extremities without difficulty  Other:  Abdomen is distended with diffuse tenderness to palpation.  Medical Decision Making  Medically screening exam initiated at 9:56 AM.  Appropriate orders placed.  [name] was informed that the remainder of the evaluation will be completed by another provider, this initial triage assessment does not replace that evaluation, and the importance of remaining in the ED until their evaluation is complete.     Rex Kras, [place] 06/21/22 1733

## 2022-06-21 NOTE — ED Notes (Signed)
Lab notified of urine culture add on 

## 2022-06-21 NOTE — ED Triage Notes (Signed)
Per EMS- patient is from [facility]. Patient arrives with a foley and bloody urine. Staff also reports mid abdominal pain, mild distention, and N/V x 2 days.  EMS did not obtain vitals.

## 2022-06-23 LAB — URINE CULTURE: Culture: >=100000 — AB

## 2022-06-24 ENCOUNTER — Telehealth (HOSPITAL_BASED_OUTPATIENT_CLINIC_OR_DEPARTMENT_OTHER): Payer: Self-pay | Admitting: *Deleted

## 2022-06-24 NOTE — Telephone Encounter (Signed)
Post ED Visit - Positive Culture Follow-up  Culture report reviewed by antimicrobial stewardship pharmacist: [place] Team '[]'$  Elenor Quinones, Pharm.D. '[]'$  Heide Guile, Pharm.D., BCPS AQ-ID '[]'$  Parks Neptune, Pharm.D., BCPS '[]'$  Alycia Rossetti, Pharm.D., BCPS '[]'$  [place], [place].D., BCPS, AAHIVP '[]'$  Legrand Como, Pharm.D., BCPS, AAHIVP '[]'$  Salome Arnt, PharmD, BCPS '[]'$  Johnnette Gourd, PharmD, BCPS '[]'$  Hughes Better, PharmD, BCPS '[]'$  Leeroy Cha, PharmD '[]'$  Laqueta Linden, PharmD, BCPS '[]'$  Albertina Parr, PharmD  [facility] Team '[]'$  Leodis Sias, PharmD '[]'$  Lindell Spar, PharmD '[]'$  Royetta Asal, PharmD '[]'$  Graylin Shiver, Rph '[]'$  Rema Fendt) Glennon Mac, PharmD '[]'$  Arlyn Dunning, PharmD '[]'$  Netta Cedars, PharmD '[]'$  Dia Sitter, PharmD '[]'$  Leone Haven, PharmD '[x]'$  Gretta Arab, PharmD '[]'$  Theodis Shove, PharmD '[]'$  Peggyann Juba, PharmD '[]'$  Reuel Boom, PharmD   Positive urine culture Treated with Sulfamethoxazole-Trimethoprim, organism sensitive to the same and no further patient follow-up is required at this time.  Calvin [name] 06/24/2022, 12:35 PM

## 2022-06-25 DIAGNOSIS — R110 Nausea: Secondary | ICD-10-CM | POA: Diagnosis not present

## 2022-06-25 DIAGNOSIS — N390 Urinary tract infection, site not specified: Secondary | ICD-10-CM | POA: Diagnosis not present

## 2022-06-25 DIAGNOSIS — R339 Retention of urine, unspecified: Secondary | ICD-10-CM | POA: Diagnosis not present

## 2022-06-25 DIAGNOSIS — Z978 Presence of other specified devices: Secondary | ICD-10-CM | POA: Diagnosis not present

## 2022-06-25 DIAGNOSIS — N320 Bladder-neck obstruction: Secondary | ICD-10-CM | POA: Diagnosis not present

## 2022-06-27 DIAGNOSIS — S82832S Other fracture of upper and lower end of left fibula, sequela: Secondary | ICD-10-CM | POA: Diagnosis not present

## 2022-06-27 DIAGNOSIS — R296 Repeated falls: Secondary | ICD-10-CM | POA: Diagnosis not present

## 2022-06-27 DIAGNOSIS — Z9181 History of falling: Secondary | ICD-10-CM | POA: Diagnosis not present

## 2022-06-27 DIAGNOSIS — R2681 Unsteadiness on feet: Secondary | ICD-10-CM | POA: Diagnosis not present

## 2022-06-27 DIAGNOSIS — M6281 Muscle weakness (generalized): Secondary | ICD-10-CM | POA: Diagnosis not present

## 2022-06-27 DIAGNOSIS — R262 Difficulty in walking, not elsewhere classified: Secondary | ICD-10-CM | POA: Diagnosis not present

## 2022-06-28 ENCOUNTER — Telehealth (INDEPENDENT_AMBULATORY_CARE_PROVIDER_SITE_OTHER): Payer: Medicare HMO | Admitting: Family Medicine

## 2022-06-28 DIAGNOSIS — Z0000 Encounter for general adult medical examination without abnormal findings: Secondary | ICD-10-CM

## 2022-06-28 NOTE — Progress Notes (Signed)
PATIENT CHECK-IN and HEALTH RISK ASSESSMENT QUESTIONNAIRE:  -completed by phone/video for upcoming Medicare Preventive Visit  Pre-Visit Check-in: 1)Vitals (height, wt, BP, etc) - record in vitals section for visit on day of visit 2)Review and Update Medications, Allergies PMH, Surgeries, Social history in Epic 3)Hospitalizations in the last year with date/reason?  4)Review and Update Care Team (patient's specialists) in Epic 5) Complete PHQ9 in Epic  6) Complete Fall Screening in Epic 7)Review all Health Maintenance Due and order under PCP if not done.  Medicare Wellness Patient Questionnaire:  Answer theses question about your habits: Do you drink alcohol? No  Have you ever smoked?No  How many packs a day do/did you smoke? N/a Do you use smokeless tobacco? No Do you use an illicit drugs? No Do you exercises? Yes, but currently in nursing home - doing physical therapy and occupational therapy every day Typical breakfast: Sausage, eggs toast Typical lunch/dinner: eating nursing home food   Beverages: Water  Answer theses question about you: Can you perform most household chores? Yes, but hasn't been home in 4 months Do you find it hard to follow a conversation in a noisy room? Yes, hearing aids - needs appt to recheck  Do you often ask people to speak up or repeat themselves? Yes Do you feel that you have a problem with memory? No Do you balance your checkbook and or bank acounts? Yes Do you feel safe at home? Yes Last dentist visit? 6 months ago Do you need assistance with any of the following: Please note if so   Driving? When at home he is able to do all but is still currently in rehab  Feeding yourself?  Getting from bed to chair?  Getting to the toilet?  Bathing or showering?  Dressing yourself?  Managing money?  Climbing a flight of stairs  Preparing meals?  Plans to see dentist, eye doctor and audiologist once out of rehab.   Do you have Advanced Directives in  place (Living Will, Healthcare Power or Attorney)? Yes   Last eye Exam and location? Due for one   Do you currently use prescribed or non-prescribed narcotic or opioid pain medications? No  Do you have a history or close family history of breast, ovarian, tubal or peritoneal cancer or a family member with BRCA (breast cancer susceptibility 1 and 2) gene mutations? No  Nurse/Assistant Credentials/time stamp:   ----------------------------------------------------------------------------------------------------------------------------------------------------------------------------------------------------------------------    MEDICARE ANNUAL PREVENTIVE CARE VISIT WITH PROVIDER (Welcome to Medicare, initial annual wellness or annual wellness exam)  Virtual Visit via Video Note  I connected with Calvin [name]  On 06/28/22 by phone and verified that I am speaking with the correct person using two identifiers.  Location patient: home Location provider:work or home office Persons participating in the virtual visit: patient, provider  Concerns and/or follow up today: none, nothing new, just saw PCP recently   See HM section in Epic for other details of completed HM.    ROS: negative for report of fevers, unintentional weight loss, vision changes, vision loss, hearing loss or change, chest pain, sob, hemoptysis, melena, hematochezia, hematuria, genital discharge or lesions, falls, bleeding or bruising, loc, thoughts of suicide or self harm, memory loss  Patient-completed extensive health risk assessment - reviewed and discussed with the patient: See Health Risk Assessment completed with patient prior to the visit either above or in recent phone note. This was reviewed in detailed with the patient today and appropriate recommendations, orders and referrals were placed as needed per Summary below  and patient instructions.   Review of Medical History: -PMH, PSH, Family History and current  specialty and care providers reviewed and updated and listed below   Patient Care Team: Tyrone Schimke., MD as PCP - General (Internal Medicine) Viona Gilmore, [facility] (Inactive) as Pharmacist (Pharmacist) Tat, Eustace Quail, DO as Consulting Physician (Neurology)   Past Medical History:  Diagnosis Date   Benign prostatic hypertrophy    COVID-19 virus infection 02/24/2020   ED (erectile dysfunction)    Headache(784.0)    Hypertension    Hypogonadism male    Low back pain 06/09/2020   Nephrolithiasis    hx of had lithotripsy in 1-10.   NEPHROLITHIASIS, HX OF 10/25/2008   Qualifier: Diagnosis of   By: Sarajane Jews MD, Ishmael Holter      Pyelonephritis 02/25/2022    Past Surgical History:  Procedure Laterality Date   colonoscopy  10/05/2020   per Dr. Fuller Plan, adenomatous polyps, repeat in 3 yrs   [place]  03/12/2012   Procedure: APPENDECTOMY LAPAROSCOPIC;  Surgeon: Harl Bowie, MD;  Location: [place];  Service: General;  Laterality: N/A;   STAPLE HEMORRHOIDECTOMY  04/14/2010   per Dr. Johney Maine    TONSILLECTOMY      Social History   Socioeconomic History   Marital status: Widowed    Spouse name: Not on file   Number of children: Not on file   Years of education: Not on file   Highest education level: Not on file  Occupational History   Occupation: retired    Comment: owned [employer]  Tobacco Use   Smoking status: Never   Smokeless tobacco: Never  Scientific laboratory technician Use: Never used  Substance and Sexual Activity   Alcohol use: Not Currently    Alcohol/week: 14.0 standard drinks of alcohol    Types: 14 Standard drinks or equivalent per week    Comment: quit 3 weeks ago   Drug use: No   Sexual activity: Not on file  Other Topics Concern   Not on file  Social History Narrative   Right Handed    Lives in a one story home with a basement.    Social Determinants of Health   Financial Resource Strain: Low Risk  (04/14/2020)   Overall  Financial Resource Strain (CARDIA)    Difficulty of Paying Living Expenses: Not hard at all  Food Insecurity: No Food Insecurity (02/25/2022)   Hunger Vital Sign    Worried About Running Out of Food in the Last Year: Never true    Ran Out of Food in the Last Year: Never true  Transportation Needs: No Transportation Needs (02/25/2022)   PRAPARE - Hydrologist (Medical): No    Lack of Transportation (Non-Medical): No  Physical Activity: Inactive (03/08/2021)   Exercise Vital Sign    Days of Exercise per Week: 0 days    Minutes of Exercise per Session: 0 min  Stress: No Stress Concern Present (04/14/2020)   [place]    Feeling of Stress : Not at all  Social Connections: Socially Isolated (03/08/2021)   Social Connection and Isolation Panel [NHANES]    Frequency of Communication with Friends and Family: Once a week    Frequency of Social Gatherings with Friends and Family: Once a week    Attends Religious Services: Never    Marine scientist or Organizations: No  Attends Archivist Meetings: Never    Marital Status: Widowed  Intimate Partner Violence: Unknown (02/25/2022)   Humiliation, Afraid, Rape, and Kick questionnaire    Fear of Current or Ex-Partner: No    Emotionally Abused: No    Physically Abused: Not on file    Sexually Abused: Not on file    Family History  Problem Relation Age of Onset   Heart Problems Mother        Caused by a Virus when she was a teen   Stroke Father    Prostate cancer Father    Hypertension Other    Stroke Other    Heart disease Other        rheumatic    Coronary artery disease Other    Healthy Child    Colon cancer Neg Hx    Colon polyps Neg Hx    Esophageal cancer Neg Hx    Rectal cancer Neg Hx    Stomach cancer Neg Hx     Current Outpatient Medications on File Prior to Visit  Medication Sig Dispense Refill   amLODipine  (NORVASC) 5 MG tablet TAKE 1 TABLET (5 MG TOTAL) BY MOUTH DAILY. 90 tablet 3   aspirin EC 81 MG tablet Take 1 tablet (81 mg total) by mouth daily. Swallow whole. 30 tablet 12   atorvastatin (LIPITOR) 40 MG tablet Take 1 tablet (40 mg total) by mouth daily. 30 tablet 0   cyanocobalamin (VITAMIN B12) 1000 MCG/ML injection INJECT 1 ML (1,000 MCG TOTAL) INTO THE MUSCLE ONCE A WEEK. 12 mL 1   cyclobenzaprine (FLEXERIL) 10 MG tablet Take 10 mg by mouth 3 (three) times daily as needed for muscle spasms.     finasteride (PROSCAR) 5 MG tablet Take 1 tablet (5 mg total) by mouth daily. 90 tablet 3   folic acid (FOLVITE) 1 MG tablet Take 1 tablet (1 mg total) by mouth daily. 30 tablet 0   KLOR-CON M10 10 MEQ tablet Take 10 mEq by mouth 2 (two) times daily.     loperamide (IMODIUM) 2 MG capsule Take 1 capsule (2 mg total) by mouth every 8 (eight) hours as needed for diarrhea or loose stools. 30 capsule 0   losartan (COZAAR) 100 MG tablet TAKE 1 TABLET BY MOUTH EVERY DAY 90 tablet 3   Multiple Vitamin (MULTIVITAMIN WITH MINERALS) TABS tablet Take 1 tablet by mouth daily. 30 tablet 0   ondansetron (ZOFRAN) 4 MG tablet Take 4 mg by mouth every 8 (eight) hours as needed for nausea or vomiting.     sertraline (ZOLOFT) 100 MG tablet TAKE 1 TABLET BY MOUTH EVERY DAY 90 tablet 0   SYRINGE-NEEDLE, DISP, 3 ML (BD ECLIPSE SYRINGE/NEEDLE) 25G X 5/8" 3 ML MISC Use as directed once a week 12 each 0   tamsulosin (FLOMAX) 0.4 MG CAPS capsule TAKE 1 CAPSULE BY MOUTH EVERY DAY 90 capsule 3   thiamine (VITAMIN B-1) 100 MG tablet Take 1 tablet (100 mg total) by mouth daily. 30 tablet 0   No current facility-administered medications on file prior to visit.    No Known Allergies     Physical Exam There were no vitals filed for this visit. Estimated body mass index is 29.97 kg/m as calculated from the following:   Height as of 06/21/22: 6' (1.829 m).   Weight as of 06/21/22: 221 lb (100.2 kg).  EKG (optional): deferred due  to virtual visit  GENERAL: alert, oriented, no audible sounds of distress, vision exam deferred  due to audio  PSYCH/NEURO: pleasant and cooperative, no obvious depression or anxiety, speech and thought processing grossly intact, Cognitive function grossly intact  Flowsheet Row Video Visit from 06/28/2022 in [place] at [place]  PHQ-9 Total Score 0           06/28/2022    4:44 PM 03/14/2022    3:00 PM 02/06/2022   10:20 AM 01/10/2022    9:29 AM 12/25/2021    2:48 PM  Depression screen PHQ 2/9  Decreased Interest 0 3 0 0 3  Down, Depressed, Hopeless 0  '1 1 3  '$ PHQ - 2 Score 0 '3 1 1 6  '$ Altered sleeping 0  0 0 3  Tired, decreased energy 0 '3 3 2 3  '$ Change in appetite 0  0 0 3  Feeling bad or failure about yourself  0  0 2 3  Trouble concentrating 0 3 3 0 1  Moving slowly or fidgety/restless 0  0 1 1  Suicidal thoughts 0  0 0 2  PHQ-9 Score 0  '7 6 22  '$ Difficult doing work/chores Not difficult at all Extremely dIfficult Not difficult at all Somewhat difficult Extremely dIfficult       06/02/2022    8:00 PM 06/03/2022    3:00 PM 06/03/2022    8:00 PM 06/21/2022    9:20 AM 06/28/2022    4:43 PM  Fall Risk  Falls in the past year?     1  Was there an injury with Fall?     1  Fall Risk Category Calculator     3  Fall Risk Category (Retired)     High  (RETIRED) Patient Fall Risk Level High fall risk High fall risk High fall risk High fall risk High fall risk  Patient at Risk for Falls Due to     History of fall(s)  Fall risk Follow up     Falls evaluation completed     SUMMARY AND PLAN:  Medicare annual wellness visit, subsequent   Discussed applicable health maintenance/preventive health measures and advised and referred or ordered per patient preferences:  Health Maintenance  Topic Date Due   COVID-19 Vaccine (6 - 2023-24 season) 07/14/2022    Medicare Annual Wellness (AWV)  06/29/2023   COLONOSCOPY (Pts 45-56yr Insurance coverage will need to be confirmed)   10/06/2023   DTaP/Tdap/Td (2 - Td or Tdap) 09/21/2025   Pneumonia Vaccine 75 Years old  Completed   INFLUENZA VACCINE  Completed   Hepatitis C Screening  Completed   Zoster Vaccines- Shingrix  Completed   HPV VACCINES  Aged Out    Education and counseling on the following was provided based on the above review of health and a plan/checklist for the patient, along with additional information discussed, was provided for the patient in the patient instructions :   -Provided counseling and plan for difficulty hearing discussed/referral to audiology or ENT if applicable per above screening. Plans to schedule follow up to recheck hearing aids.  -congratulated on healthy habits including regular exercise, discussed guidelines for adults. Summary of exercise guidelines and healthy diet included in handout in pt instructions. -Advise yearly dental visits at minimum and regular eye exams  Follow up: see patient instructions   Patient Instructions  I really enjoyed getting to talk with you today! I am available on Tuesdays and Thursdays for virtual visits if you have any questions or concerns, or if I can be of any further assistance.   [place]  WELLNESS VISIT:  -Follow up (please call to schedule if not scheduled after visit):   -yearly for annual wellness visit with primary care office  Here is a list of your preventive care/health maintenance measures and the plan for each if any are due:  Health Maintenance  Topic Date Due   INFLUENZA VACCINE  Due yearly in the fall   COVID-19 Vaccine (6 - 2023-24 season) Vita Barley will be due yearly in the fall   Medicare Annual Wellness (AWV)  06/29/2023   COLONOSCOPY (Pts 45-60yr Insurance coverage will need to be confirmed)  10/06/2023   DTaP/Tdap/Td (2 - Td or Tdap) 09/21/2025   Pneumonia Vaccine 75 Years old  Completed   Hepatitis C Screening  Completed   Zoster Vaccines- Shingrix  Completed   HPV VACCINES  Aged Out    -See a  dentist at least yearly  -Get your eyes checked and then per your eye specialist's recommendations  -Other issues addressed today:   -I have included below further information regarding a healthy whole foods based diet, physical activity guidelines for adults, stress management and opportunities for social connections. I hope you find this information useful.     NUTRITION: -eat real food: lots of colorful vegetables (half the plate) and fruits -5-7 servings of vegetables and fruits per day (fresh or steamed is best), exp. 2 servings of vegetables with lunch and dinner and 2 servings of fruit per day. Berries and greens such as kale and collards are great choices.  -consume on a regular basis: whole grains (make sure first ingredient on label contains the word "whole"), fresh fruits, fish, nuts, seeds, healthy oils (such as olive oil, avocado oil, grape seed oil) -may eat small amounts of dairy and lean meat on occasion, but avoid processed meats such as ham, bacon, lunch meat, etc. -drink water -try to avoid fast food and pre-packaged foods, processed meat -most experts advise limiting sodium to < '2300mg'$  per day, should limit further is any chronic conditions such as high blood pressure, heart disease, diabetes, etc. The American Heart Association advised that < '1500mg'$  is is ideal -try to avoid foods that contain any ingredients with names you do not recognize  -try to avoid sugar/sweets (except for the natural sugar that occurs in fresh fruit) -try to avoid sweet drinks -try to avoid white rice, white bread, pasta (unless whole grain), white or yellow potatoes  EXERCISE GUIDELINES FOR ADULTS: -if you wish to increase your physical activity, do  so gradually and with the approval of your doctor -STOP and seek medical care immediately if you have any chest pain, chest discomfort or trouble breathing when starting or increasing exercise  -move and stretch your body, legs, feet and arms when sitting for long periods -Physical activity guidelines for optimal health in adults: -least 150 minutes per week of aerobic exercise (can talk, but not sing) once approved by your doctor, 20-30 minutes of sustained activity or two 10 minute episodes of sustained activity every day.  -resistance training at least 2 days per week if approved by your doctor -balance exercises 3+ days per week:   Stand somewhere where you have something sturdy to hold onto if you lose balance.    1) lift up on toes, start with 5x per day and work up to 20x   2) stand and lift on leg straight out to the side so that foot is a few inches of the floor, start with 5x each side and work up to 20x  each side   3) stand on one foot, start with 5 seconds each side and work up to 20 seconds on each side  If you need ideas or help with getting more active:  -Silver sneakers https://tools.silversneakers.com  -Walk with a Doc: http://stephens-thompson.biz/  -try to include resistance (weight lifting/strength building) and balance exercises twice per week: or the following link for ideas: ChessContest.fr  UpdateClothing.com.cy  STRESS MANAGEMENT: -can try meditating, or just sitting quietly with deep breathing while intentionally relaxing all parts of your body for 5 minutes daily -if you need further help with stress, anxiety or depression please follow up with your primary doctor or contact the wonderful folks at [place]: [place]: -options in [place] if you wish to engage in more social and exercise related activities:  -Silver  sneakers https://tools.silversneakers.com  -Walk with a Doc: http://stephens-thompson.biz/  -Check out the [place] 50+ section on the [place] of [employer] (hiking clubs, book clubs, cards and games, chess, exercise classes, aquatic classes and much more) - see the website for details: https://www.[place].gov/departments/parks-recreation/active-adults50  -YouTube has lots of exercise videos for different ages and abilities as well  -[place] (a variety of indoor and outdoor inperson activities for adults). [phone]. [address]  -Virtual Online Classes (a variety of topics): see seniorplanet.org or call [phone]  -consider volunteering at a school, hospice center, church, senior center or elsewhere           Lucretia Kern, DO

## 2022-06-28 NOTE — Patient Instructions (Addendum)
I really enjoyed getting to talk with you today! I am available on Tuesdays and Thursdays for virtual visits if you have any questions or concerns, or if I can be of any further assistance.   CHECKLIST FROM ANNUAL WELLNESS VISIT:  -Follow up (please call to schedule if not scheduled after visit):   -yearly for annual wellness visit with primary care office  Here is a list of your preventive care/health maintenance measures and the plan for each if any are due:  Health Maintenance  Topic Date Due   INFLUENZA VACCINE  Due yearly in the fall   COVID-19 Vaccine (6 - 2023-24 season) Vita Barley will be due yearly in the fall   Medicare Annual Wellness (AWV)  06/29/2023   COLONOSCOPY (Pts 45-60yr Insurance coverage will need to be confirmed)  10/06/2023   DTaP/Tdap/Td (2 - Td or Tdap) 09/21/2025   Pneumonia Vaccine 75 Years old  Completed   Hepatitis C Screening  Completed   Zoster Vaccines- Shingrix  Completed   HPV VACCINES  Aged Out    -See a dentist at least yearly  -Get your eyes checked and then per your eye specialist's recommendations  -Other issues addressed today:   -I have included below further information regarding a healthy whole foods based diet, physical activity guidelines for adults, stress management and opportunities for social connections. I hope you find this information useful.     NUTRITION: -eat real food: lots of colorful vegetables (half the plate) and fruits -5-7 servings of vegetables and fruits per day (fresh or steamed is best), exp. 2 servings of vegetables with lunch and dinner and 2 servings of fruit per day. Berries and greens such as kale and collards are great choices.  -consume on a regular basis: whole  grains (make sure first ingredient on label contains the word "whole"), fresh fruits, fish, nuts, seeds, healthy oils (such as olive oil, avocado oil, grape seed oil) -may eat small amounts of dairy and lean meat on occasion, but avoid processed meats such as ham, bacon, lunch meat, etc. -drink water -try to avoid fast food and pre-packaged foods, processed meat -most experts advise limiting sodium to < '2300mg'$  per day, should limit further is any chronic conditions such as high blood pressure, heart disease, diabetes, etc. The American Heart Association advised that < '1500mg'$  is is ideal -try to avoid foods that contain any ingredients with names you do not recognize  -try to avoid sugar/sweets (except for the natural sugar that occurs in fresh fruit) -try to avoid sweet drinks -try to avoid white rice, white bread, pasta (unless whole grain), white or yellow potatoes  EXERCISE GUIDELINES FOR ADULTS: -if you wish to increase your physical activity, do so gradually and with the approval of your doctor -STOP and seek medical care immediately if you have any chest pain, chest discomfort or trouble breathing when starting or increasing exercise  -move and stretch your body, legs, feet and arms when sitting for long periods -Physical activity guidelines for optimal health in adults: -least 150 minutes per week of aerobic exercise (can talk, but not sing) once approved by your doctor, 20-30 minutes of sustained activity or two 10 minute episodes of sustained activity every day.  -resistance training at least 2 days per week if approved by your doctor -balance exercises 3+ days per week:   Stand somewhere where you have something sturdy to hold onto if you lose balance.    1) lift up on toes, start with 5x per  day and work up to 20x   2) stand and lift on leg straight out to the side so that foot is a few inches of the floor, start with 5x each side and work up to 20x each side   3) stand on one foot,  start with 5 seconds each side and work up to 20 seconds on each side  If you need ideas or help with getting more active:  -Silver sneakers https://tools.silversneakers.com  -Walk with a Doc: http://stephens-thompson.biz/  -try to include resistance (weight lifting/strength building) and balance exercises twice per week: or the following link for ideas: ChessContest.fr  UpdateClothing.com.cy  STRESS MANAGEMENT: -can try meditating, or just sitting quietly with deep breathing while intentionally relaxing all parts of your body for 5 minutes daily -if you need further help with stress, anxiety or depression please follow up with your primary doctor or contact the wonderful folks at [place]: [place]: -options in [place] if you wish to engage in more social and exercise related activities:  -Silver sneakers https://tools.silversneakers.com  -Walk with a Doc: http://stephens-thompson.biz/  -Check out the [place] 50+ section on the [place] [employer] (hiking clubs, book clubs, cards and games, chess, exercise classes, aquatic classes and much more) - see the website for details: https://www.[place].gov/departments/parks-recreation/active-adults50  -YouTube has lots of exercise videos for different ages and abilities as well  -[place] (a variety of indoor and outdoor inperson activities for adults). [phone]. [address]  -Virtual Online Classes (a variety of topics): see seniorplanet.org or call [phone]  -consider volunteering at a school, hospice center, church, senior center or elsewhere

## 2022-07-04 DIAGNOSIS — N320 Bladder-neck obstruction: Secondary | ICD-10-CM | POA: Diagnosis not present

## 2022-07-04 DIAGNOSIS — I10 Essential (primary) hypertension: Secondary | ICD-10-CM | POA: Diagnosis not present

## 2022-07-05 DIAGNOSIS — R69 Illness, unspecified: Secondary | ICD-10-CM | POA: Diagnosis not present

## 2022-07-05 DIAGNOSIS — S8265XA Nondisplaced fracture of lateral malleolus of left fibula, initial encounter for closed fracture: Secondary | ICD-10-CM | POA: Diagnosis not present

## 2022-07-05 DIAGNOSIS — I10 Essential (primary) hypertension: Secondary | ICD-10-CM | POA: Diagnosis not present

## 2022-07-05 DIAGNOSIS — R339 Retention of urine, unspecified: Secondary | ICD-10-CM | POA: Diagnosis not present

## 2022-07-05 DIAGNOSIS — M8430XA Stress fracture, unspecified site, initial encounter for fracture: Secondary | ICD-10-CM | POA: Diagnosis not present

## 2022-07-09 ENCOUNTER — Emergency Department (HOSPITAL_COMMUNITY)
Admission: EM | Admit: 2022-07-09 | Discharge: 2022-07-10 | Disposition: A | Discharge status: Home / Self Care | Payer: Medicare HMO | Attending: Emergency Medicine | Admitting: Emergency Medicine

## 2022-07-09 ENCOUNTER — Encounter (HOSPITAL_COMMUNITY): Payer: Self-pay | Admitting: Emergency Medicine

## 2022-07-09 DIAGNOSIS — N3001 Acute cystitis with hematuria: Secondary | ICD-10-CM | POA: Insufficient documentation

## 2022-07-09 DIAGNOSIS — Z7982 Long term (current) use of aspirin: Secondary | ICD-10-CM | POA: Insufficient documentation

## 2022-07-09 DIAGNOSIS — E876 Hypokalemia: Secondary | ICD-10-CM | POA: Diagnosis not present

## 2022-07-09 DIAGNOSIS — I10 Essential (primary) hypertension: Secondary | ICD-10-CM | POA: Insufficient documentation

## 2022-07-09 DIAGNOSIS — Z79899 Other long term (current) drug therapy: Secondary | ICD-10-CM | POA: Insufficient documentation

## 2022-07-09 DIAGNOSIS — R110 Nausea: Secondary | ICD-10-CM | POA: Diagnosis not present

## 2022-07-09 DIAGNOSIS — Z743 Need for continuous supervision: Secondary | ICD-10-CM | POA: Diagnosis not present

## 2022-07-09 DIAGNOSIS — M8430XA Stress fracture, unspecified site, initial encounter for fracture: Secondary | ICD-10-CM | POA: Diagnosis not present

## 2022-07-09 DIAGNOSIS — N320 Bladder-neck obstruction: Secondary | ICD-10-CM | POA: Diagnosis not present

## 2022-07-09 DIAGNOSIS — R339 Retention of urine, unspecified: Secondary | ICD-10-CM | POA: Diagnosis not present

## 2022-07-09 DIAGNOSIS — M62838 Other muscle spasm: Secondary | ICD-10-CM | POA: Diagnosis not present

## 2022-07-09 DIAGNOSIS — R69 Illness, unspecified: Secondary | ICD-10-CM | POA: Diagnosis not present

## 2022-07-09 MED ORDER — ONDANSETRON 4 MG PO TBDP
4.0000 mg | ORAL_TABLET | Freq: Once | ORAL | Status: AC
  Administered 2022-07-09: 4 mg via ORAL
  Filled 2022-07-09: qty 1

## 2022-07-09 NOTE — ED Triage Notes (Signed)
Patient presents from [facility] due to urinary retention. Earlier this morning the patient noticed there wasn't any draining in his catheter bag. The facility attempted to switch out the foley, but was unsuccessful due to blood clots.    EMS vitals: 180/110 BP 88 HR 97% SPO2 on room air

## 2022-07-09 NOTE — ED Provider Notes (Signed)
[place] EMERGENCY DEPARTMENT AT [facility] Provider Note   CSN: [id number] Arrival date & time: 07/09/22  2209     History  Chief Complaint  Patient presents with   Urinary Retention    Calvin [name] is a 77 y.o. male.  The history is provided by the patient and medical records.    77 year old male with history of hypertension, BPH with urinary retention with indwelling foley catheter, presenting to the ED for foley issues.  Patient states at SNF this morning foley would not drain.  They were going to change his Foley, however when they pulled out his Foley they noticed the tubing was full of clots and so sent him to the ER.  No attempt made to replace foley at facility per patient.  He does feel a lot of pressure in his bladder with urge to urinate but unable to pass any urine.  He states this is the third time something like this has happened.  He has upcoming follow-up with urology.  Home Medications Prior to Admission medications   Medication Sig Start Date End Date Taking? Authorizing Provider  cephALEXin (KEFLEX) 500 MG capsule Take 1 capsule (500 mg total) by mouth 2 (two) times daily. 07/10/22  Yes Larene Pickett, PA-C  amLODipine (NORVASC) 5 MG tablet TAKE 1 TABLET (5 MG TOTAL) BY MOUTH DAILY. 05/03/22   Laurey Morale, MD  aspirin EC 81 MG tablet Take 1 tablet (81 mg total) by mouth daily. Swallow whole. 06/01/22   Raiford Noble Latif, DO  atorvastatin (LIPITOR) 40 MG tablet Take 1 tablet (40 mg total) by mouth daily. 06/01/22   Sheikh, Georgina Quint Latif, DO  cyanocobalamin (VITAMIN B12) 1000 MCG/ML injection INJECT 1 ML (1,000 MCG TOTAL) INTO THE MUSCLE ONCE A WEEK. 03/13/22   Laurey Morale, MD  cyclobenzaprine (FLEXERIL) 10 MG tablet Take 10 mg by mouth 3 (three) times daily as needed for muscle spasms. 03/04/22   [provider]  finasteride (PROSCAR) 5 MG tablet Take 1 tablet (5 mg total) by mouth daily. 04/25/21   Laurey Morale, MD  folic acid  (FOLVITE) 1 MG tablet Take 1 tablet (1 mg total) by mouth daily. 06/01/22   Sheikh, Omair Latif, DO  KLOR-CON M10 10 MEQ tablet Take 10 mEq by mouth 2 (two) times daily. 03/22/22   [provider]  loperamide (IMODIUM) 2 MG capsule Take 1 capsule (2 mg total) by mouth every 8 (eight) hours as needed for diarrhea or loose stools. 03/21/22   Aline August, MD  losartan (COZAAR) 100 MG tablet TAKE 1 TABLET BY MOUTH EVERY DAY 05/03/22   Laurey Morale, MD  Multiple Vitamin (MULTIVITAMIN WITH MINERALS) TABS tablet Take 1 tablet by mouth daily. 06/01/22   Sheikh, Omair Latif, DO  ondansetron (ZOFRAN) 4 MG tablet Take 4 mg by mouth every 8 (eight) hours as needed for nausea or vomiting.    [provider]  sertraline (ZOLOFT) 100 MG tablet TAKE 1 TABLET BY MOUTH EVERY DAY 05/03/22   Laurey Morale, MD  SYRINGE-NEEDLE, DISP, 3 ML (BD ECLIPSE SYRINGE/NEEDLE) 25G X 5/8" 3 ML MISC Use as directed once a week 12/26/21   Laurey Morale, MD  tamsulosin (FLOMAX) 0.4 MG CAPS capsule TAKE 1 CAPSULE BY MOUTH EVERY DAY 05/03/22   Laurey Morale, MD  thiamine (VITAMIN B-1) 100 MG tablet Take 1 tablet (100 mg total) by mouth daily. 06/01/22   Kerney Elbe, DO  Allergies    Patient has no known allergies.    Review of Systems   Review of Systems  Genitourinary:        Foley problem  All other systems reviewed and are negative.   Physical Exam Updated Vital Signs BP (!) 145/94   Pulse 100   Temp (!) 97.5 F (36.4 C) (Oral)   Resp 20   SpO2 91%   Physical Exam Vitals and nursing note reviewed.  Constitutional:      Appearance: He is well-developed.  HENT:     Head: Normocephalic and atraumatic.  Eyes:     Conjunctiva/sclera: Conjunctivae normal.     Pupils: Pupils are equal, round, and reactive to light.  Cardiovascular:     Rate and Rhythm: Normal rate and regular rhythm.     Heart sounds: Normal heart sounds.  Pulmonary:     Effort: Pulmonary effort is normal.      Breath sounds: Normal breath sounds.  Abdominal:     General: Bowel sounds are normal.     Palpations: Abdomen is soft.     Comments: Fullness over bladder  Genitourinary:    Comments: Foley removed prior to arrival Musculoskeletal:        General: Normal range of motion.     Cervical back: Normal range of motion.  Skin:    General: Skin is warm and dry.  Neurological:     Mental Status: He is alert and oriented to person, place, and time.     ED Results / Procedures / Treatments   Labs (all labs ordered are listed, but only abnormal results are displayed) Labs Reviewed  URINALYSIS, ROUTINE W REFLEX MICROSCOPIC - Abnormal; Notable for the following components:      Result Value   APPearance HAZY (*)    Hgb urine dipstick MODERATE (*)    Leukocytes,Ua LARGE (*)    WBC, UA >50 (*)    Bacteria, UA MANY (*)    All other components within normal limits  URINE CULTURE    EKG None  Radiology No results found.  Procedures Procedures    Medications Ordered in ED Medications  ondansetron (ZOFRAN-ODT) disintegrating tablet 4 mg (4 mg Oral Given 07/09/22 2302)  oxyCODONE-acetaminophen (PERCOCET/ROXICET) 5-325 MG per tablet 1 tablet (1 tablet Oral Given 07/10/22 0129)  cephALEXin (KEFLEX) capsule 500 mg (500 mg Oral Given 07/10/22 0231)    ED Course/ Medical Decision Making/ A&P                             Medical Decision Making Amount and/or Complexity of Data Reviewed Labs: ordered.  Risk Prescription drug management.   77 year old male presenting to the ED with urinary retention.  No drainage from Foley catheter since this morning, was removed at facility and noted to be full of clots, however no attempt made at replacement Foley.  Patient is uncomfortable appearing here, does have some fullness over the bladder.  New Foley catheter was inserted, drained out approximately 2400cc of yellow urine, no large clots present.  Patient feels significantly better after urine  has drained.  UA sample was sent as he did have recent UTI, still appears infectious with many bacteria, >50 WBC.  Most recent urine culture grew out Klebsiella, sensitive to cephalosporins so we will start course of Keflex.  He has previously scheduled urology follow-up in the next 2 weeks.  Can return here for new concerns.  Final Clinical Impression(s) /  ED Diagnoses Final diagnoses:  Urinary retention  Acute cystitis with hematuria    Rx / DC Orders ED Discharge Orders          Ordered    cephALEXin (KEFLEX) 500 MG capsule  2 times daily        07/10/22 0158              Larene Pickett, PA-C 07/10/22 0245    Davonna Belling, MD 07/12/22 [phone]

## 2022-07-09 NOTE — ED Notes (Addendum)
Materials sending down 3-way coude cath due to hx of successful cath with 3-way coude. Pt has reported clotting in urethra.

## 2022-07-10 ENCOUNTER — Encounter (HOSPITAL_COMMUNITY): Payer: Self-pay | Admitting: Emergency Medicine

## 2022-07-10 ENCOUNTER — Other Ambulatory Visit: Payer: Self-pay

## 2022-07-10 LAB — URINALYSIS, ROUTINE W REFLEX MICROSCOPIC
Bilirubin Urine: NEGATIVE
Glucose, UA: NEGATIVE mg/dL
Ketones, ur: NEGATIVE mg/dL
Nitrite: NEGATIVE
Protein, ur: NEGATIVE mg/dL
Specific Gravity, Urine: 1.01 (ref 1.005–1.030)
WBC, UA: >50 WBC/hpf — ABNORMAL HIGH (ref 0–5)
pH: 7 (ref 5.0–8.0)

## 2022-07-10 MED ORDER — OXYCODONE-ACETAMINOPHEN 5-325 MG PO TABS
1.0000 | ORAL_TABLET | Freq: Once | ORAL | Status: AC
  Administered 2022-07-10: 1 via ORAL
  Filled 2022-07-10: qty 1

## 2022-07-10 MED ORDER — CEPHALEXIN 500 MG PO CAPS: 500.0000 mg | ORAL_CAPSULE | Freq: Two times a day (BID) | ORAL | 0 refills | Status: DC

## 2022-07-10 MED ORDER — CEPHALEXIN 500 MG PO CAPS
500.0000 mg | ORAL_CAPSULE | Freq: Once | ORAL | Status: AC
  Administered 2022-07-10: 500 mg via ORAL
  Filled 2022-07-10: qty 1

## 2022-07-10 NOTE — ED Notes (Signed)
RN tried to call facility. Someone will answer but nobody is responding. Will call back again later.

## 2022-07-10 NOTE — ED Notes (Signed)
Tried to call back again. Unable to reach the Staff at the facility.

## 2022-07-10 NOTE — Discharge Instructions (Signed)
New foley was placed today.  Urine looks infected again, take antibiotics as prescribed. Follow-up with your primary care doctor. Return to the ED for new or worsening symptoms.

## 2022-07-11 DIAGNOSIS — N320 Bladder-neck obstruction: Secondary | ICD-10-CM | POA: Diagnosis not present

## 2022-07-11 DIAGNOSIS — I10 Essential (primary) hypertension: Secondary | ICD-10-CM | POA: Diagnosis not present

## 2022-07-11 DIAGNOSIS — M8430XA Stress fracture, unspecified site, initial encounter for fracture: Secondary | ICD-10-CM | POA: Diagnosis not present

## 2022-07-11 DIAGNOSIS — R110 Nausea: Secondary | ICD-10-CM | POA: Diagnosis not present

## 2022-07-11 DIAGNOSIS — M62838 Other muscle spasm: Secondary | ICD-10-CM | POA: Diagnosis not present

## 2022-07-11 DIAGNOSIS — E876 Hypokalemia: Secondary | ICD-10-CM | POA: Diagnosis not present

## 2022-07-11 DIAGNOSIS — R69 Illness, unspecified: Secondary | ICD-10-CM | POA: Diagnosis not present

## 2022-07-12 DIAGNOSIS — N390 Urinary tract infection, site not specified: Secondary | ICD-10-CM | POA: Diagnosis not present

## 2022-07-12 DIAGNOSIS — I639 Cerebral infarction, unspecified: Secondary | ICD-10-CM | POA: Diagnosis not present

## 2022-07-12 DIAGNOSIS — R69 Illness, unspecified: Secondary | ICD-10-CM | POA: Diagnosis not present

## 2022-07-12 DIAGNOSIS — I10 Essential (primary) hypertension: Secondary | ICD-10-CM | POA: Diagnosis not present

## 2022-07-12 DIAGNOSIS — Z7189 Other specified counseling: Secondary | ICD-10-CM | POA: Diagnosis not present

## 2022-07-12 DIAGNOSIS — R5381 Other malaise: Secondary | ICD-10-CM | POA: Diagnosis not present

## 2022-07-12 DIAGNOSIS — N320 Bladder-neck obstruction: Secondary | ICD-10-CM | POA: Diagnosis not present

## 2022-07-12 LAB — URINE CULTURE: Culture: >=100000 — AB

## 2022-07-13 ENCOUNTER — Telehealth (HOSPITAL_BASED_OUTPATIENT_CLINIC_OR_DEPARTMENT_OTHER): Payer: Self-pay

## 2022-07-13 NOTE — Telephone Encounter (Signed)
Post ED Visit - Positive Culture Follow-up  Culture report reviewed by antimicrobial stewardship pharmacist: [facility] Team '[]'$  Elenor Quinones, Pharm.D. '[]'$  Heide Guile, Pharm.D., BCPS AQ-ID '[]'$  Parks Neptune, Pharm.D., BCPS '[]'$  Alycia Rossetti, Pharm.D., BCPS '[]'$  [place], Pharm.D., BCPS, AAHIVP '[]'$  Legrand Como, Pharm.D., BCPS, AAHIVP '[]'$  Salome Arnt, PharmD, BCPS '[]'$  Johnnette Gourd, PharmD, BCPS '[]'$  Hughes Better, PharmD, BCPS '[]'$  Leeroy Cha, PharmD '[]'$  Laqueta Linden, PharmD, BCPS '[]'$  Albertina Parr, PharmD  [facility] Team '[x]'$  Heide Guile, PharmD '[]'$  Lindell Spar, PharmD '[]'$  Royetta Asal, PharmD '[]'$  Graylin Shiver, Rph '[]'$  Rema Fendt) Glennon Mac, PharmD '[]'$  Arlyn Dunning, PharmD '[]'$  Netta Cedars, PharmD '[]'$  Dia Sitter, PharmD '[]'$  Leone Haven, PharmD '[]'$  Gretta Arab, PharmD '[]'$  Theodis Shove, PharmD '[]'$  Peggyann Juba, PharmD '[]'$  Reuel Boom, PharmD   Positive urine culture Treated with Cephalexin, organism sensitive to the same and no further patient follow-up is required at this time.  Glennon Hamilton 07/13/2022, 8:56 AM

## 2022-07-16 ENCOUNTER — Other Ambulatory Visit: Payer: Self-pay | Admitting: Family Medicine

## 2022-07-16 NOTE — Progress Notes (Deleted)
Assessment/Plan:    Gait instability Likely multifactorial I do think that the patient has peripheral neuropathy, which is likely the primary contributor.  Alcohol is the primary contributor to this, but B12 deficiency may contribute as well.  Patient on oral supplementation.  Patient is just a bit bradykinetic.  They do describe festination.  Patient does not meet criteria for Parkinson's disease today and I expressed this to them today.  Offered DaTscan, which was declined.  I would like to follow him clinically and both of them agreed. I will send him for physical therapy for gait and balance training. Acute infarct, right pons, December, 2023 A Patient was on aspirin and Plavix and now on aspirin alone. B.  Patient's LDL in the hospital was 92.  Lipitor increased to 40 mg in the hospital. C. Alcohol positive on admission to hospital  3.  Alcohol use d/o  A.  Recommend d/c   Subjective:   Calvin [name] was seen today in follow up.  I last saw the patient in July.  At that point in time, the patient had gait instability, primarily due to peripheral neuropathy.  Alcohol was the main contributor to that, although B12 was also a contributor.  Patient was also mildly bradykinetic, so I wanted to continue to follow-up with them.  We also talked about the fact that while I was proud of him for stopping alcohol, I was concerned that he might go back to drinking after his daughter left to go back to [place], as she was the primary reason that he stopped.  Unfortunately, that concern turned out to be validated.  He was in the hospital in December.  He was found minimally responsive, having slid out of his wheelchair.  His alcohol level was elevated on admission.  He was hyponatremic.  He had a left lower extremity stress fracture.  He had a urinary tract infection and MRI of the brain demonstrated an acute infarct in the right pons.  I personally reviewed that.  The patient also has  ventriculomegaly, but has fairly significant atrophy as well.  Patient went to [facility] for therapy.  He came back to the ER January 4 with abdominal pain and hematuria in the Foley bag.  Patient was treated for urinary tract infection and followed up with urology.  Patient was again back in the emergency room January 22 because of poorly draining Foley.  He was again treated for urinary tract infection.  Current prescribed movement disorder medications: ***   PREVIOUS MEDICATIONS: {Parkinson's RX:18200}  ALLERGIES:  No Known Allergies  CURRENT MEDICATIONS:  No outpatient medications have been marked as taking for the 07/17/22 encounter (Appointment) with Johnita Palleschi, Eustace Quail, DO.     Objective:   PHYSICAL EXAMINATION:    VITALS:  There were no vitals filed for this visit.  GEN:  The patient appears stated age and is in NAD. HEENT:  Normocephalic, atraumatic.  The mucous membranes are moist. The superficial temporal arteries are without ropiness or tenderness. CV:  RRR Lungs:  CTAB Neck/HEME:  There are no carotid bruits bilaterally.  Neurological examination:  Orientation: The patient is alert and oriented x3.  Cranial nerves: There is good facial symmetry. Min facial hypomimia.  Extraocular muscles are intact. The visual fields are full to confrontational testing. The speech is fluent and clear. Soft palate rises symmetrically and there is no tongue deviation. Hearing is intact to conversational tone. Sensation: Sensation is intact to light and pinprick throughout (facial,  trunk, extremities). Vibration is intact at the bilateral big toe. There is no extinction with double simultaneous stimulation. There is no sensory dermatomal level identified. Motor: Strength is 5/5 in the bilateral upper and lower extremities.   Shoulder shrug is equal and symmetric.  There is no pronator drift. Deep tendon reflexes: Deep tendon reflexes are 2/4 at the bilateral biceps, triceps, brachioradialis,  right patella, absent at the L and bilateral achilles. Plantar responses are downgoing bilaterally.   Movement examination: Tone: There is nl tone in the bilateral upper extremities.  The tone in the lower extremities is nl.  Abnormal movements: there is no rest tremor.  There is mild postural tremor Coordination:  There is no decremation with RAM's, with any form of RAMS, including alternating supination and pronation of the forearm, hand opening and closing, finger taps, heel taps and toe taps. Gait and Station: The patient pushes off to arise.  Pt is very wide based and short stepped.  He is slow and tenuous.  He has decreased arm swing b/l      I have reviewed and interpreted the following labs independently    Chemistry      Component Value Date/Time   NA 132 (L) 06/21/2022 1010   K 4.2 06/21/2022 1010   CL 97 (L) 06/21/2022 1010   CO2 20 (L) 06/21/2022 1010   BUN 22 06/21/2022 1010   CREATININE 1.16 06/21/2022 1010   CREATININE 0.84 01/05/2020 1047      Component Value Date/Time   CALCIUM 9.8 06/21/2022 1010   ALKPHOS 86 06/21/2022 1010   AST 21 06/21/2022 1010   ALT 18 06/21/2022 1010   BILITOT 1.4 (H) 06/21/2022 1010       Lab Results  Component Value Date   WBC 17.7 (H) 06/21/2022   HGB 15.8 06/21/2022   HCT 44.8 06/21/2022   MCV 90.1 06/21/2022   PLT 371 06/21/2022    Lab Results  Component Value Date   TSH 3.94 12/25/2021     Total time spent on today's visit was ***30 minutes, including both face-to-face time and nonface-to-face time.  Time included that spent on review of records (prior notes available to me/labs/imaging if pertinent), discussing treatment and goals, answering patient's questions and coordinating care.  Cc:  Tyrone Schimke., MD

## 2022-07-17 ENCOUNTER — Ambulatory Visit: Payer: Medicare HMO | Admitting: Neurology

## 2022-07-17 ENCOUNTER — Encounter: Payer: Self-pay | Admitting: Neurology

## 2022-07-17 ENCOUNTER — Other Ambulatory Visit: Payer: Self-pay

## 2022-07-17 ENCOUNTER — Emergency Department (HOSPITAL_BASED_OUTPATIENT_CLINIC_OR_DEPARTMENT_OTHER)
Admission: EM | Admit: 2022-07-17 | Discharge: 2022-07-17 | Disposition: A | Discharge status: Home / Self Care | Payer: Medicare HMO | Attending: Emergency Medicine | Admitting: Emergency Medicine

## 2022-07-17 ENCOUNTER — Telehealth: Payer: Self-pay

## 2022-07-17 ENCOUNTER — Encounter (HOSPITAL_BASED_OUTPATIENT_CLINIC_OR_DEPARTMENT_OTHER): Payer: Self-pay | Admitting: Emergency Medicine

## 2022-07-17 DIAGNOSIS — T83098A Other mechanical complication of other indwelling urethral catheter, initial encounter: Secondary | ICD-10-CM | POA: Diagnosis not present

## 2022-07-17 DIAGNOSIS — N401 Enlarged prostate with lower urinary tract symptoms: Secondary | ICD-10-CM | POA: Insufficient documentation

## 2022-07-17 DIAGNOSIS — T839XXA Unspecified complication of genitourinary prosthetic device, implant and graft, initial encounter: Secondary | ICD-10-CM

## 2022-07-17 DIAGNOSIS — Y828 Other medical devices associated with adverse incidents: Secondary | ICD-10-CM | POA: Diagnosis not present

## 2022-07-17 DIAGNOSIS — T83091A Other mechanical complication of indwelling urethral catheter, initial encounter: Secondary | ICD-10-CM | POA: Insufficient documentation

## 2022-07-17 DIAGNOSIS — R531 Weakness: Secondary | ICD-10-CM | POA: Diagnosis not present

## 2022-07-17 DIAGNOSIS — Z743 Need for continuous supervision: Secondary | ICD-10-CM | POA: Diagnosis not present

## 2022-07-17 DIAGNOSIS — R339 Retention of urine, unspecified: Secondary | ICD-10-CM | POA: Diagnosis not present

## 2022-07-17 DIAGNOSIS — Z029 Encounter for administrative examinations, unspecified: Secondary | ICD-10-CM

## 2022-07-17 NOTE — Telephone Encounter (Signed)
        Patient  visited [facility] on 1/23     Telephone encounter attempt : 1st    A HIPAA compliant voice message was left requesting a return call.  Instructed patient to call back .    [facility] [phone] [address], [place], [place] [address] Phone: [phone] Email: [email or web address]

## 2022-07-17 NOTE — ED Notes (Signed)
Patient verbalizes understanding of discharge instructions. Opportunity for questioning and answers were provided. Patient discharged from ED.  °

## 2022-07-17 NOTE — Discharge Instructions (Addendum)
We replaced your Foley catheter today and it seems to be draining very well. Please follow-up with your urologist on 07/25/2022 Please return if having recurring abdominal pain or issues with foley, fevers, vomiting, chills

## 2022-07-17 NOTE — ED Triage Notes (Signed)
Pt arrives to ED via [facility] due to urinary retention. He noted increase suprapubic pressure with less urine in his catheter bag.

## 2022-07-17 NOTE — ED Provider Notes (Signed)
[facility] Provider Note   CSN: [id number] Arrival date & time: 07/17/22  [id number]     History  Chief Complaint  Patient presents with   Urinary Retention    Calvin [name] is a 77 y.o. male PMH of urinary retention, BPH with chronic indwelling foley catheter. He says this is the 5th time this has happened in the last few months.  On 07/09/2022 for similar issues and had a Foley catheter that was replaced and that drained out 2400 of yellow urine. UA at that time still appeared infectious and culture with K Pneumoniae and was treated with Keflex.  He says he is not taking this medication any more (was prescribed a 5 day course). Currently at home as he was discharged from SNF 4 days ago. Has urology appointment scheduled on 07/25/2022. Denies any fevers, vomiting, chills. Originally had abdominal pain and distention but this was relieved by foley re-insertion here  HPI     Home Medications Prior to Admission medications   Medication Sig Start Date End Date Taking? Authorizing Provider  amLODipine (NORVASC) 5 MG tablet TAKE 1 TABLET (5 MG TOTAL) BY MOUTH DAILY. 05/03/22   Laurey Morale, MD  aspirin EC 81 MG tablet Take 1 tablet (81 mg total) by mouth daily. Swallow whole. 06/01/22   Raiford Noble Latif, DO  atorvastatin (LIPITOR) 40 MG tablet Take 1 tablet (40 mg total) by mouth daily. 06/01/22   Sheikh, Omair Latif, DO  cephALEXin (KEFLEX) 500 MG capsule Take 1 capsule (500 mg total) by mouth 2 (two) times daily. 07/10/22   Larene Pickett, PA-C  cyanocobalamin (VITAMIN B12) 1000 MCG/ML injection INJECT 1 ML (1,000 MCG TOTAL) INTO THE MUSCLE ONCE A WEEK. 03/13/22   Laurey Morale, MD  cyclobenzaprine (FLEXERIL) 10 MG tablet Take 10 mg by mouth 3 (three) times daily as needed for muscle spasms. 03/04/22   [provider]  finasteride (PROSCAR) 5 MG tablet Take 1 tablet (5 mg total) by mouth daily. 04/25/21   Laurey Morale, MD  folic acid  (FOLVITE) 1 MG tablet Take 1 tablet (1 mg total) by mouth daily. 06/01/22   Sheikh, Omair Latif, DO  KLOR-CON M10 10 MEQ tablet Take 10 mEq by mouth 2 (two) times daily. 03/22/22   [provider]  loperamide (IMODIUM) 2 MG capsule Take 1 capsule (2 mg total) by mouth every 8 (eight) hours as needed for diarrhea or loose stools. 03/21/22   Aline August, MD  losartan (COZAAR) 100 MG tablet TAKE 1 TABLET BY MOUTH EVERY DAY 05/03/22   Laurey Morale, MD  Multiple Vitamin (MULTIVITAMIN WITH MINERALS) TABS tablet Take 1 tablet by mouth daily. 06/01/22   Sheikh, Omair Latif, DO  ondansetron (ZOFRAN) 4 MG tablet Take 4 mg by mouth every 8 (eight) hours as needed for nausea or vomiting.    [provider]  sertraline (ZOLOFT) 100 MG tablet TAKE 1 TABLET BY MOUTH EVERY DAY 07/16/22   Laurey Morale, MD  SYRINGE-NEEDLE, DISP, 3 ML (BD ECLIPSE SYRINGE/NEEDLE) 25G X 5/8" 3 ML MISC Use as directed once a week 12/26/21   Laurey Morale, MD  tamsulosin (FLOMAX) 0.4 MG CAPS capsule TAKE 1 CAPSULE BY MOUTH EVERY DAY 05/03/22   Laurey Morale, MD  thiamine (VITAMIN B-1) 100 MG tablet Take 1 tablet (100 mg total) by mouth daily. 06/01/22   Kerney Elbe, DO      Allergies    Patient  has no known allergies.    Review of Systems   Review of Systems  Constitutional:  Negative for chills and fever.  HENT:  Negative for ear pain and sore throat.   Eyes:  Negative for pain and visual disturbance.  Respiratory:  Negative for cough and shortness of breath.   Cardiovascular:  Negative for chest pain and palpitations.  Gastrointestinal:  Positive for abdominal distention and abdominal pain. Negative for vomiting.  Genitourinary:  Positive for decreased urine volume. Negative for dysuria and hematuria.  Musculoskeletal:  Negative for arthralgias and back pain.  Skin:  Negative for color change and rash.  Neurological:  Negative for seizures and syncope.  All other systems reviewed and are  negative.   Physical Exam Updated Vital Signs BP (!) 181/119 (BP Location: Left Arm)   Pulse (!) 110   Temp 98.3 F (36.8 C)   Resp 20   SpO2 98%  Physical Exam Vitals and nursing note reviewed.  Constitutional:      General: He is not in acute distress.    Appearance: He is well-developed. He is not toxic-appearing.  HENT:     Head: Normocephalic and atraumatic.  Eyes:     Conjunctiva/sclera: Conjunctivae normal.  Cardiovascular:     Rate and Rhythm: Normal rate and regular rhythm.     Heart sounds: No murmur heard. Pulmonary:     Effort: Pulmonary effort is normal. No respiratory distress.     Breath sounds: Normal breath sounds.  Abdominal:     General: There is distension.     Palpations: Abdomen is soft.     Tenderness: There is no abdominal tenderness.     Comments: Assessed after foley replaced-patient no longer tender to palpation and distention relieved.  Musculoskeletal:        General: No swelling.     Cervical back: Neck supple.  Skin:    General: Skin is warm and dry.     Capillary Refill: Capillary refill takes less than 2 seconds.  Neurological:     General: No focal deficit present.     Mental Status: He is alert.  Psychiatric:        Mood and Affect: Mood normal.     ED Results / Procedures / Treatments   Labs (all labs ordered are listed, but only abnormal results are displayed) Labs Reviewed - No data to display  EKG None  Radiology No results found.  Procedures Procedures   Medications Ordered in ED Medications - No data to display  ED Course/ Medical Decision Making/ A&P                            Medical Decision Making  Medical Decision Making:   Calvin [name] is a 77 y.o. male who presented to the ED today with abdominal distention, Foley catheter not draining detailed above.    Additional history discussed with patient's family/caregivers.  Complete initial physical exam performed, notably the patient  was abdominal  distention and .    Reviewed and confirmed nursing documentation for past medical history, family history, social history.    Initial Assessment:   With the patient's presentation of abdominal distention and urinary retention, most likely diagnosis is Foley catheter issue. Other diagnoses were considered including (but not limited to) UTI, renal stone, other intra-abdominal process. These are considered less likely due to history of present illness and physical exam findings.     Initial Plan:  Exchange Foley catheter Objective evaluation as below reviewed   Final Assessment and Plan:   77 year old male here with abdominal distention and foley catheter not draining.  Has had multiple events similar to this in the past.  After Foley catheter placement patient voided 1900 mL of urine with small amount of clots.  Patient had great relief after this and does not have any systemic symptoms of fevers, chills, vomiting which is reassuring.  Discussed close urology follow-up on 07/25/2022.  Clinical Impression:  1. Problem with Foley catheter, initial encounter ([facility])   2. Urinary retention due to benign prostatic hyperplasia      Discharge   Final Clinical Impression(s) / ED Diagnoses Final diagnoses:  None    Rx / DC Orders ED Discharge Orders     None         Gerrit Heck, MD 07/17/22 1034    Elnora Morrison, MD 07/27/22 2316

## 2022-07-18 ENCOUNTER — Telehealth: Payer: Self-pay

## 2022-07-18 NOTE — Telephone Encounter (Signed)
        Patient  visited [facility] on 1/23     Telephone encounter attempt :  2nd  A HIPAA compliant voice message was left requesting a return call.  Instructed patient to call back .    [facility] [phone] [address], [place], [place] [address] Phone: [phone] Email: [email or web address]

## 2022-07-19 DIAGNOSIS — H43811 Vitreous degeneration, right eye: Secondary | ICD-10-CM | POA: Diagnosis not present

## 2022-07-19 DIAGNOSIS — Z0100 Encounter for examination of eyes and vision without abnormal findings: Secondary | ICD-10-CM | POA: Diagnosis not present

## 2022-07-20 ENCOUNTER — Telehealth: Payer: Self-pay | Admitting: Neurology

## 2022-07-20 DIAGNOSIS — Z8673 Personal history of transient ischemic attack (TIA), and cerebral infarction without residual deficits: Secondary | ICD-10-CM | POA: Diagnosis not present

## 2022-07-20 DIAGNOSIS — Z466 Encounter for fitting and adjustment of urinary device: Secondary | ICD-10-CM | POA: Diagnosis not present

## 2022-07-20 DIAGNOSIS — F418 Other specified anxiety disorders: Secondary | ICD-10-CM | POA: Diagnosis not present

## 2022-07-20 DIAGNOSIS — R69 Illness, unspecified: Secondary | ICD-10-CM | POA: Diagnosis not present

## 2022-07-20 DIAGNOSIS — N401 Enlarged prostate with lower urinary tract symptoms: Secondary | ICD-10-CM | POA: Diagnosis not present

## 2022-07-20 DIAGNOSIS — R339 Retention of urine, unspecified: Secondary | ICD-10-CM | POA: Diagnosis not present

## 2022-07-20 DIAGNOSIS — Z79899 Other long term (current) drug therapy: Secondary | ICD-10-CM | POA: Diagnosis not present

## 2022-07-20 DIAGNOSIS — Z7982 Long term (current) use of aspirin: Secondary | ICD-10-CM | POA: Diagnosis not present

## 2022-07-20 DIAGNOSIS — I10 Essential (primary) hypertension: Secondary | ICD-10-CM | POA: Diagnosis not present

## 2022-07-20 DIAGNOSIS — Z9181 History of falling: Secondary | ICD-10-CM | POA: Diagnosis not present

## 2022-07-20 DIAGNOSIS — N320 Bladder-neck obstruction: Secondary | ICD-10-CM | POA: Diagnosis not present

## 2022-07-20 DIAGNOSIS — E538 Deficiency of other specified B group vitamins: Secondary | ICD-10-CM | POA: Diagnosis not present

## 2022-07-20 DIAGNOSIS — Z87440 Personal history of urinary (tract) infections: Secondary | ICD-10-CM | POA: Diagnosis not present

## 2022-07-20 DIAGNOSIS — N3001 Acute cystitis with hematuria: Secondary | ICD-10-CM | POA: Diagnosis not present

## 2022-07-20 DIAGNOSIS — F32A Depression, unspecified: Secondary | ICD-10-CM | POA: Diagnosis not present

## 2022-07-20 DIAGNOSIS — R338 Other retention of urine: Secondary | ICD-10-CM | POA: Diagnosis not present

## 2022-07-20 DIAGNOSIS — Z8616 Personal history of COVID-19: Secondary | ICD-10-CM | POA: Diagnosis not present

## 2022-07-20 DIAGNOSIS — I639 Cerebral infarction, unspecified: Secondary | ICD-10-CM | POA: Diagnosis not present

## 2022-07-20 DIAGNOSIS — B961 Klebsiella pneumoniae [K. pneumoniae] as the cause of diseases classified elsewhere: Secondary | ICD-10-CM | POA: Diagnosis not present

## 2022-07-20 NOTE — Telephone Encounter (Signed)
Called [facility] and directed them to the PCP for orders

## 2022-07-20 NOTE — Telephone Encounter (Signed)
[facility] needs verbal orders to start physical therapy 1 time a week for 1 week, 2 times a week for 3 weeks, and 1 time a week for 3 weeks for strengthening, safe transfers, gait balance training, home exercise program and safety fall precautions.   She also wanted to report a fall today. His bed is too high and the family is trying to get a lower bed.

## 2022-07-20 NOTE — Telephone Encounter (Signed)
We have not seen patient he no showed last appointment and has been in the hospital. Patient has seen Dr. Sarajane Jews in this time should I direct this order to Dr. Sarajane Jews?

## 2022-07-23 ENCOUNTER — Telehealth: Payer: Self-pay | Admitting: Family Medicine

## 2022-07-23 NOTE — Telephone Encounter (Signed)
Rquests to start Home health physical therapy   1x1/2x3/1x3

## 2022-07-24 NOTE — Telephone Encounter (Signed)
Please okay these orders  ?

## 2022-07-24 NOTE — Telephone Encounter (Signed)
Spoke with Beckie Busing, advised that VO have been approved

## 2022-07-24 NOTE — Telephone Encounter (Signed)
Physical Therapist is saying Pt really needs to start PT ASAP. PT needs a call back with approval as soon as possible.

## 2022-07-25 DIAGNOSIS — Z466 Encounter for fitting and adjustment of urinary device: Secondary | ICD-10-CM | POA: Diagnosis not present

## 2022-07-25 DIAGNOSIS — I10 Essential (primary) hypertension: Secondary | ICD-10-CM | POA: Diagnosis not present

## 2022-07-25 DIAGNOSIS — Z7982 Long term (current) use of aspirin: Secondary | ICD-10-CM | POA: Diagnosis not present

## 2022-07-25 DIAGNOSIS — N401 Enlarged prostate with lower urinary tract symptoms: Secondary | ICD-10-CM | POA: Diagnosis not present

## 2022-07-25 DIAGNOSIS — Z8673 Personal history of transient ischemic attack (TIA), and cerebral infarction without residual deficits: Secondary | ICD-10-CM | POA: Diagnosis not present

## 2022-07-25 DIAGNOSIS — B961 Klebsiella pneumoniae [K. pneumoniae] as the cause of diseases classified elsewhere: Secondary | ICD-10-CM | POA: Diagnosis not present

## 2022-07-25 DIAGNOSIS — R69 Illness, unspecified: Secondary | ICD-10-CM | POA: Diagnosis not present

## 2022-07-25 DIAGNOSIS — E538 Deficiency of other specified B group vitamins: Secondary | ICD-10-CM | POA: Diagnosis not present

## 2022-07-25 DIAGNOSIS — R339 Retention of urine, unspecified: Secondary | ICD-10-CM | POA: Diagnosis not present

## 2022-07-25 DIAGNOSIS — N3001 Acute cystitis with hematuria: Secondary | ICD-10-CM | POA: Diagnosis not present

## 2022-07-25 DIAGNOSIS — N320 Bladder-neck obstruction: Secondary | ICD-10-CM | POA: Diagnosis not present

## 2022-07-27 ENCOUNTER — Telehealth: Payer: Self-pay | Admitting: Family Medicine

## 2022-07-27 DIAGNOSIS — B961 Klebsiella pneumoniae [K. pneumoniae] as the cause of diseases classified elsewhere: Secondary | ICD-10-CM | POA: Diagnosis not present

## 2022-07-27 DIAGNOSIS — N320 Bladder-neck obstruction: Secondary | ICD-10-CM | POA: Diagnosis not present

## 2022-07-27 DIAGNOSIS — N3001 Acute cystitis with hematuria: Secondary | ICD-10-CM | POA: Diagnosis not present

## 2022-07-27 DIAGNOSIS — N401 Enlarged prostate with lower urinary tract symptoms: Secondary | ICD-10-CM | POA: Diagnosis not present

## 2022-07-27 DIAGNOSIS — Z7982 Long term (current) use of aspirin: Secondary | ICD-10-CM | POA: Diagnosis not present

## 2022-07-27 DIAGNOSIS — R338 Other retention of urine: Secondary | ICD-10-CM | POA: Diagnosis not present

## 2022-07-27 DIAGNOSIS — Z466 Encounter for fitting and adjustment of urinary device: Secondary | ICD-10-CM | POA: Diagnosis not present

## 2022-07-27 DIAGNOSIS — R69 Illness, unspecified: Secondary | ICD-10-CM | POA: Diagnosis not present

## 2022-07-27 DIAGNOSIS — R339 Retention of urine, unspecified: Secondary | ICD-10-CM | POA: Diagnosis not present

## 2022-07-27 DIAGNOSIS — Z8673 Personal history of transient ischemic attack (TIA), and cerebral infarction without residual deficits: Secondary | ICD-10-CM | POA: Diagnosis not present

## 2022-07-27 DIAGNOSIS — I10 Essential (primary) hypertension: Secondary | ICD-10-CM | POA: Diagnosis not present

## 2022-07-27 DIAGNOSIS — E538 Deficiency of other specified B group vitamins: Secondary | ICD-10-CM | POA: Diagnosis not present

## 2022-07-27 NOTE — Telephone Encounter (Signed)
Spoke with Sharyn Lull with [facility] verbalized understanding that VO requested have been approved

## 2022-07-27 NOTE — Telephone Encounter (Signed)
Sharyn Lull from [facility] call and stated pt cancel his occupational therapy evaluation visit for to day and want it next week because he have a appt for today. Sharyn Lull want a call back at [phone].

## 2022-07-27 NOTE — Telephone Encounter (Signed)
Yes that is okay  

## 2022-07-27 NOTE — Telephone Encounter (Signed)
Spoke with Sharyn Lull, she stated that the patient has a physical therapy appointment this morning and a Urology appointment this afternoon.    Sharyn Lull is requesting a verbal okay to move OT eval appointment until next week.

## 2022-07-28 DIAGNOSIS — Z9181 History of falling: Secondary | ICD-10-CM | POA: Diagnosis not present

## 2022-07-28 DIAGNOSIS — R262 Difficulty in walking, not elsewhere classified: Secondary | ICD-10-CM | POA: Diagnosis not present

## 2022-07-28 DIAGNOSIS — S82832S Other fracture of upper and lower end of left fibula, sequela: Secondary | ICD-10-CM | POA: Diagnosis not present

## 2022-07-28 DIAGNOSIS — M6281 Muscle weakness (generalized): Secondary | ICD-10-CM | POA: Diagnosis not present

## 2022-07-28 DIAGNOSIS — R296 Repeated falls: Secondary | ICD-10-CM | POA: Diagnosis not present

## 2022-07-28 DIAGNOSIS — R2681 Unsteadiness on feet: Secondary | ICD-10-CM | POA: Diagnosis not present

## 2022-07-30 DIAGNOSIS — N401 Enlarged prostate with lower urinary tract symptoms: Secondary | ICD-10-CM | POA: Diagnosis not present

## 2022-07-30 DIAGNOSIS — I10 Essential (primary) hypertension: Secondary | ICD-10-CM | POA: Diagnosis not present

## 2022-07-30 DIAGNOSIS — N3001 Acute cystitis with hematuria: Secondary | ICD-10-CM | POA: Diagnosis not present

## 2022-07-30 DIAGNOSIS — Z8673 Personal history of transient ischemic attack (TIA), and cerebral infarction without residual deficits: Secondary | ICD-10-CM | POA: Diagnosis not present

## 2022-07-30 DIAGNOSIS — B961 Klebsiella pneumoniae [K. pneumoniae] as the cause of diseases classified elsewhere: Secondary | ICD-10-CM | POA: Diagnosis not present

## 2022-07-30 DIAGNOSIS — N320 Bladder-neck obstruction: Secondary | ICD-10-CM | POA: Diagnosis not present

## 2022-07-30 DIAGNOSIS — Z7982 Long term (current) use of aspirin: Secondary | ICD-10-CM | POA: Diagnosis not present

## 2022-07-30 DIAGNOSIS — R339 Retention of urine, unspecified: Secondary | ICD-10-CM | POA: Diagnosis not present

## 2022-07-30 DIAGNOSIS — Z466 Encounter for fitting and adjustment of urinary device: Secondary | ICD-10-CM | POA: Diagnosis not present

## 2022-07-30 DIAGNOSIS — R69 Illness, unspecified: Secondary | ICD-10-CM | POA: Diagnosis not present

## 2022-07-30 DIAGNOSIS — E538 Deficiency of other specified B group vitamins: Secondary | ICD-10-CM | POA: Diagnosis not present

## 2022-07-31 ENCOUNTER — Telehealth: Payer: Self-pay

## 2022-07-31 NOTE — Telephone Encounter (Signed)
     Patient  visit on 07/17/2022  at [facility] was for Problem with Foley catheter, initial encounter.  Have you been able to follow up with your primary care physician? No, patient stated he is feeling better.  The patient was or was not able to obtain any needed medicine or equipment. No medication prescribed.  Are there diet recommendations that you are having difficulty following? No  Patient expresses understanding of discharge instructions and education provided has no other needs at this time. Yes   [facility] Resource Care Guide   [email or web address]  ?? [phone]   Website: [email or web address]  [email or web address]

## 2022-07-31 NOTE — Progress Notes (Signed)
Care Management & Coordination Services Pharmacy Team  Reason for Encounter: Hypertension  Contacted patient to discuss hypertension disease state. {US HC Outreach:28874}    Current antihypertensive regimen:  Amlodipine 5 mg daily  HCTZ 25 mg daily (midnight)  Losartan 100 mg daily   Patient verbally confirms he is taking the above medications as directed. {yes/no:20286}  How often are you checking your Blood Pressure? {CHL HP BP Monitoring Frequency:[phone]}  he checks his blood pressure {timing:25218} {before/after:25217} taking his medication.  Current home BP readings: ***  DATE:             BP               PULSE   Wrist or arm cuff: Caffeine intake: Salt intake: OTC medications including pseudoephedrine or NSAIDs?  Any readings above 180/100? {yes/no:20286} If yes any symptoms of hypertensive emergency? {hypertensive emergency symptoms:25354}  What recent interventions/DTPs have been made by any provider to improve Blood Pressure control since last CPP Visit: ***  Any recent hospitalizations or ED visits since last visit with CPP? {yes/no:20286}  What diet changes have been made to improve Blood Pressure Control?  ***  What exercise is being done to improve your Blood Pressure Control?  ***  Adherence Review: Is the patient currently on ACE/ARB medication? {yes/no:20286} Does the patient have >5 day gap between last estimated fill dates? {yes/no:20286}  Star Rating Drugs:  Atorvastatin (Lipitor) 20 mg - Last filled 07/30/22 90 [facility] Losartan (Cozaar) 100 mg - Last filled 08/16/22 90 DS at [facility]   Chart Updates: Recent office visits:  06/28/22 Lucretia Kern, DO - Patient presented via phone for Medicare Annual Wellness visit subsequent. No medication changes.  06/08/22 Laurey Morale, MD - Patient presented for Acute CVA and other concerns. No medication changes. Pt advised to go to ED  Recent consult visits:   08/09/22 Isaac Bliss, Rayford Halsted, MD -  Patient presented for Shortness of breath and pther concerns. No medication changes.  07/05/21 Joanne Gavel, MD - Patient presented to [facility] for follow up of closed fracture of lateral malleolus of left fibula. No medication changes.  06/15/22 Joanne Gavel, MD - Patient presented to [facility] for follow up of closed fracture of lateral malleolus of left fibula. No medication changes.  05/17/22 Festus Aloe (Urology) - Claims encounter for BPH and other concerns. No other visit details available.  05/04/22 Gammon, Chrystal - Claims encounter for Office visit other malaise and other concerns. No other visit details available.  04/23/22 Gammon, Chrystal - Claims encounter for Office visit other malaise and other concerns. No other visit details available.  04/20/22 Jacalyn Lefevre D (Urology) - Claims encounter for retention of urine. No other visit details available.  Hospital visits:  Medication Reconciliation was completed by comparing discharge summary, patient's EMR and Pharmacy list, and upon discussion with patient.  Patient presented to [facility] on 08/09/22 due to Shortness of Breath and other concerns. Patient was present for 8 hours.  New?Medications Started at [facility] Discharge:?? -started  Lasix  Medication Changes at Hospital Discharge: -Changed  none  Medications Discontinued at Hospital Discharge: -Stopped  none  Medications that remain the same after Hospital Discharge:??  -All other medications will remain the same.    Patient presented to [facility] on 07/17/22 due to Problem with Foley Catheter and other concerns. Patient was present for 2 hours.  New?Medications Started at [facility] Discharge:?? -started  none  Medication Changes  at Hospital Discharge: -Changed  none  Medications Discontinued at Hospital Discharge: -Stopped  none  Medications that remain the same after Hospital Discharge:??  -All  other medications will remain the same.     Patient presented to [facility] on 07/09/22 due to Urinary retention and other concerns. Patient was present for 7 hours.  New?Medications Started at [facility] Discharge:?? -started  Cephalexin  Medication Changes at Hospital Discharge: -Changed  none  Medications Discontinued at Hospital Discharge: -Stopped  none  Medications that remain the same after Hospital Discharge:??  -All other medications will remain the same.     Patient presented to [place]  at [facility] on 06/21/22 due to Urinary retention and other concerns. Patient was present for 14 hours.  New?Medications Started at [facility] Discharge:?? -started  none  Medication Changes at Hospital Discharge: -Changed  none  Medications Discontinued at Hospital Discharge: -Stopped  none  Medications that remain the same after Hospital Discharge:??  -All other medications will remain the same.    Medication Reconciliation was completed by comparing discharge summary, patient's EMR and Pharmacy list, and upon discussion with patient.  Patient presented to [facility] on 05/27/22 due to Acute CVA patient was present for 8 days.  New?Medications Started at [facility] Discharge:?? -started  aspirin EC cephALEXin (KEFLEX) clopidogrel (PLAVIX) folic acid (FOLVITE) multivitamin with minerals thiamine (Vitamin B-1)  Medication Changes at Hospital Discharge: -Changed  atorvastatin (LIPITOR)  Medications Discontinued at Hospital Discharge: -Stopped  diazepam 5 MG tablet (VALIUM)  Medications that remain the same after Hospital Discharge:??  -All other medications will remain the same.    Medication Reconciliation was completed by comparing discharge summary, patient's EMR and Pharmacy list, and upon discussion with patient.  Medications: Outpatient Encounter Medications as of 07/31/2022  Medication Sig Note   amLODipine (NORVASC) 5 MG  tablet TAKE 1 TABLET (5 MG TOTAL) BY MOUTH DAILY.    aspirin EC 81 MG tablet Take 1 tablet (81 mg total) by mouth daily. Swallow whole.    atorvastatin (LIPITOR) 40 MG tablet Take 1 tablet (40 mg total) by mouth daily.    cephALEXin (KEFLEX) 500 MG capsule Take 1 capsule (500 mg total) by mouth 2 (two) times daily.    cyanocobalamin (VITAMIN B12) 1000 MCG/ML injection INJECT 1 ML (1,000 MCG TOTAL) INTO THE MUSCLE ONCE A WEEK. 05/27/2022: Recent Dispenses     03/13/2022 1000 MCG/ML SOLN (disp 12, 84d supply)     cyclobenzaprine (FLEXERIL) 10 MG tablet Take 10 mg by mouth 3 (three) times daily as needed for muscle spasms. 05/27/2022: Recent Dispenses     03/04/2022 10 MG TABS (disp 60, 20d supply)     finasteride (PROSCAR) 5 MG tablet Take 1 tablet (5 mg total) by mouth daily.    folic acid (FOLVITE) 1 MG tablet Take 1 tablet (1 mg total) by mouth daily.    hydrochlorothiazide (HYDRODIURIL) 25 MG tablet Take 25 mg by mouth daily.    KLOR-CON M10 10 MEQ tablet Take 10 mEq by mouth 2 (two) times daily. 05/27/2022: Recent Dispenses     03/22/2022 10 MEQ TBCR (disp 180, 90d supply)     loperamide (IMODIUM) 2 MG capsule Take 1 capsule (2 mg total) by mouth every 8 (eight) hours as needed for diarrhea or loose stools. 05/27/2022: Recent Dispenses     03/21/2022 2 MG CAPS (disp 30, 10d supply)     losartan (COZAAR) 100 MG tablet TAKE 1 TABLET BY MOUTH EVERY  DAY    Multiple Vitamin (MULTIVITAMIN WITH MINERALS) TABS tablet Take 1 tablet by mouth daily.    ondansetron (ZOFRAN) 4 MG tablet Take 4 mg by mouth every 8 (eight) hours as needed for nausea or vomiting. 05/27/2022: No data available.   potassium chloride (KLOR-CON) 10 MEQ tablet Take 10 mEq by mouth 2 (two) times daily.    sertraline (ZOLOFT) 100 MG tablet TAKE 1 TABLET BY MOUTH EVERY DAY    sulfamethoxazole-trimethoprim (BACTRIM DS) 800-160 MG tablet Take 1 tablet by mouth 2 (two) times daily.    SYRINGE-NEEDLE, DISP, 3 ML (BD  ECLIPSE SYRINGE/NEEDLE) 25G X 5/8" 3 ML MISC Use as directed once a week    tamsulosin (FLOMAX) 0.4 MG CAPS capsule TAKE 1 CAPSULE BY MOUTH EVERY DAY    thiamine (VITAMIN B-1) 100 MG tablet Take 1 tablet (100 mg total) by mouth daily.    No facility-administered encounter medications on file as of 07/31/2022.    Recent Office Vitals: BP Readings from Last 3 Encounters:  07/17/22 (!) 154/100  07/10/22 (!) 131/99  06/21/22 (!) 116/102   Pulse Readings from Last 3 Encounters:  07/17/22 91  07/10/22 (!) 35  06/21/22 93    Wt Readings from Last 3 Encounters:  06/21/22 221 lb (100.2 kg)  06/08/22 221 lb (100.2 kg)  03/22/22 220 lb 0.3 oz (99.8 kg)     Kidney Function Lab Results  Component Value Date/Time   CREATININE 1.16 06/21/2022 10:10 AM   CREATININE 0.72 06/08/2022 10:05 AM   CREATININE 0.84 01/05/2020 10:47 AM   GFR 89.74 06/08/2022 10:05 AM   GFRNONAA >60 06/21/2022 10:10 AM   GFRAA >60 01/21/2016 04:38 AM       Latest Ref Rng & Units 06/21/2022   10:10 AM 06/08/2022   10:05 AM 06/03/2022    7:43 AM  BMP  Glucose 70 - 99 mg/dL 147  95    BUN 8 - 23 mg/dL 22  13    Creatinine 0.61 - 1.24 mg/dL 1.16  0.72  0.72   Sodium 135 - 145 mmol/L 132  138    Potassium 3.5 - 5.1 mmol/L 4.2  4.1    Chloride 98 - 111 mmol/L 97  101    CO2 22 - 32 mmol/L 20  29    Calcium 8.9 - 10.3 mg/dL 9.8  9.8         Ned Clines CMA Clinical Pharmacist Assistant [phone]

## 2022-08-01 DIAGNOSIS — Z7982 Long term (current) use of aspirin: Secondary | ICD-10-CM | POA: Diagnosis not present

## 2022-08-01 DIAGNOSIS — N3001 Acute cystitis with hematuria: Secondary | ICD-10-CM | POA: Diagnosis not present

## 2022-08-01 DIAGNOSIS — R69 Illness, unspecified: Secondary | ICD-10-CM | POA: Diagnosis not present

## 2022-08-01 DIAGNOSIS — I10 Essential (primary) hypertension: Secondary | ICD-10-CM | POA: Diagnosis not present

## 2022-08-01 DIAGNOSIS — Z466 Encounter for fitting and adjustment of urinary device: Secondary | ICD-10-CM | POA: Diagnosis not present

## 2022-08-01 DIAGNOSIS — N401 Enlarged prostate with lower urinary tract symptoms: Secondary | ICD-10-CM | POA: Diagnosis not present

## 2022-08-01 DIAGNOSIS — Z8673 Personal history of transient ischemic attack (TIA), and cerebral infarction without residual deficits: Secondary | ICD-10-CM | POA: Diagnosis not present

## 2022-08-01 DIAGNOSIS — R339 Retention of urine, unspecified: Secondary | ICD-10-CM | POA: Diagnosis not present

## 2022-08-01 DIAGNOSIS — N320 Bladder-neck obstruction: Secondary | ICD-10-CM | POA: Diagnosis not present

## 2022-08-01 DIAGNOSIS — E538 Deficiency of other specified B group vitamins: Secondary | ICD-10-CM | POA: Diagnosis not present

## 2022-08-01 DIAGNOSIS — B961 Klebsiella pneumoniae [K. pneumoniae] as the cause of diseases classified elsewhere: Secondary | ICD-10-CM | POA: Diagnosis not present

## 2022-08-03 DIAGNOSIS — Z7982 Long term (current) use of aspirin: Secondary | ICD-10-CM | POA: Diagnosis not present

## 2022-08-03 DIAGNOSIS — B961 Klebsiella pneumoniae [K. pneumoniae] as the cause of diseases classified elsewhere: Secondary | ICD-10-CM | POA: Diagnosis not present

## 2022-08-03 DIAGNOSIS — I10 Essential (primary) hypertension: Secondary | ICD-10-CM | POA: Diagnosis not present

## 2022-08-03 DIAGNOSIS — R339 Retention of urine, unspecified: Secondary | ICD-10-CM | POA: Diagnosis not present

## 2022-08-03 DIAGNOSIS — R69 Illness, unspecified: Secondary | ICD-10-CM | POA: Diagnosis not present

## 2022-08-03 DIAGNOSIS — N320 Bladder-neck obstruction: Secondary | ICD-10-CM | POA: Diagnosis not present

## 2022-08-03 DIAGNOSIS — Z8673 Personal history of transient ischemic attack (TIA), and cerebral infarction without residual deficits: Secondary | ICD-10-CM | POA: Diagnosis not present

## 2022-08-03 DIAGNOSIS — N401 Enlarged prostate with lower urinary tract symptoms: Secondary | ICD-10-CM | POA: Diagnosis not present

## 2022-08-03 DIAGNOSIS — E538 Deficiency of other specified B group vitamins: Secondary | ICD-10-CM | POA: Diagnosis not present

## 2022-08-03 DIAGNOSIS — N3001 Acute cystitis with hematuria: Secondary | ICD-10-CM | POA: Diagnosis not present

## 2022-08-03 DIAGNOSIS — Z466 Encounter for fitting and adjustment of urinary device: Secondary | ICD-10-CM | POA: Diagnosis not present

## 2022-08-06 ENCOUNTER — Telehealth: Payer: Self-pay | Admitting: Family Medicine

## 2022-08-06 DIAGNOSIS — R69 Illness, unspecified: Secondary | ICD-10-CM | POA: Diagnosis not present

## 2022-08-06 DIAGNOSIS — Z8673 Personal history of transient ischemic attack (TIA), and cerebral infarction without residual deficits: Secondary | ICD-10-CM | POA: Diagnosis not present

## 2022-08-06 DIAGNOSIS — B961 Klebsiella pneumoniae [K. pneumoniae] as the cause of diseases classified elsewhere: Secondary | ICD-10-CM | POA: Diagnosis not present

## 2022-08-06 DIAGNOSIS — N320 Bladder-neck obstruction: Secondary | ICD-10-CM | POA: Diagnosis not present

## 2022-08-06 DIAGNOSIS — Z7982 Long term (current) use of aspirin: Secondary | ICD-10-CM | POA: Diagnosis not present

## 2022-08-06 DIAGNOSIS — Z466 Encounter for fitting and adjustment of urinary device: Secondary | ICD-10-CM | POA: Diagnosis not present

## 2022-08-06 DIAGNOSIS — I10 Essential (primary) hypertension: Secondary | ICD-10-CM | POA: Diagnosis not present

## 2022-08-06 DIAGNOSIS — E538 Deficiency of other specified B group vitamins: Secondary | ICD-10-CM | POA: Diagnosis not present

## 2022-08-06 DIAGNOSIS — N3001 Acute cystitis with hematuria: Secondary | ICD-10-CM | POA: Diagnosis not present

## 2022-08-06 DIAGNOSIS — N401 Enlarged prostate with lower urinary tract symptoms: Secondary | ICD-10-CM | POA: Diagnosis not present

## 2022-08-06 DIAGNOSIS — R339 Retention of urine, unspecified: Secondary | ICD-10-CM | POA: Diagnosis not present

## 2022-08-06 NOTE — Telephone Encounter (Signed)
Requesting move his occupational therapy evaluation effective, 08/06/22

## 2022-08-07 ENCOUNTER — Telehealth: Payer: Self-pay | Admitting: Family Medicine

## 2022-08-07 DIAGNOSIS — I10 Essential (primary) hypertension: Secondary | ICD-10-CM | POA: Diagnosis not present

## 2022-08-07 DIAGNOSIS — Z8673 Personal history of transient ischemic attack (TIA), and cerebral infarction without residual deficits: Secondary | ICD-10-CM | POA: Diagnosis not present

## 2022-08-07 DIAGNOSIS — R69 Illness, unspecified: Secondary | ICD-10-CM | POA: Diagnosis not present

## 2022-08-07 DIAGNOSIS — N3001 Acute cystitis with hematuria: Secondary | ICD-10-CM | POA: Diagnosis not present

## 2022-08-07 DIAGNOSIS — Z7982 Long term (current) use of aspirin: Secondary | ICD-10-CM | POA: Diagnosis not present

## 2022-08-07 DIAGNOSIS — N320 Bladder-neck obstruction: Secondary | ICD-10-CM | POA: Diagnosis not present

## 2022-08-07 DIAGNOSIS — Z466 Encounter for fitting and adjustment of urinary device: Secondary | ICD-10-CM | POA: Diagnosis not present

## 2022-08-07 DIAGNOSIS — B961 Klebsiella pneumoniae [K. pneumoniae] as the cause of diseases classified elsewhere: Secondary | ICD-10-CM | POA: Diagnosis not present

## 2022-08-07 DIAGNOSIS — R339 Retention of urine, unspecified: Secondary | ICD-10-CM | POA: Diagnosis not present

## 2022-08-07 DIAGNOSIS — E538 Deficiency of other specified B group vitamins: Secondary | ICD-10-CM | POA: Diagnosis not present

## 2022-08-07 DIAGNOSIS — N401 Enlarged prostate with lower urinary tract symptoms: Secondary | ICD-10-CM | POA: Diagnosis not present

## 2022-08-07 NOTE — Telephone Encounter (Signed)
FYI 07/27/22 telephone message, OT evaluation appointment was rescheduled on that day also.

## 2022-08-07 NOTE — Telephone Encounter (Signed)
Calvin [name] from [facility] call and stated she want to move pt Occupational therapy evaluation to this week .Calvin [name] 's # is [phone].

## 2022-08-08 ENCOUNTER — Ambulatory Visit: Payer: Medicare HMO | Admitting: Internal Medicine

## 2022-08-08 NOTE — Telephone Encounter (Signed)
Spoke with Lynann Beaver from [facility] advised that Tommi Rumps approved VO to move pt Occupation Therapy Evaluation to this week, verbalized understanding

## 2022-08-08 NOTE — Telephone Encounter (Signed)
Please advise if ok for the request

## 2022-08-09 ENCOUNTER — Other Ambulatory Visit: Payer: Self-pay

## 2022-08-09 ENCOUNTER — Encounter (HOSPITAL_COMMUNITY): Payer: Self-pay

## 2022-08-09 ENCOUNTER — Emergency Department (HOSPITAL_COMMUNITY)
Admission: EM | Admit: 2022-08-09 | Discharge: 2022-08-10 | Disposition: A | Discharge status: Home / Self Care | Payer: Medicare HMO | Attending: Emergency Medicine | Admitting: Emergency Medicine

## 2022-08-09 ENCOUNTER — Ambulatory Visit (INDEPENDENT_AMBULATORY_CARE_PROVIDER_SITE_OTHER): Payer: Medicare HMO | Admitting: Internal Medicine

## 2022-08-09 ENCOUNTER — Emergency Department (HOSPITAL_COMMUNITY): Payer: Medicare HMO

## 2022-08-09 ENCOUNTER — Encounter: Payer: Self-pay | Admitting: Internal Medicine

## 2022-08-09 VITALS — BP 147/95 | HR 64 | Temp 98.6°F | Wt 225.5 lb

## 2022-08-09 DIAGNOSIS — Z7982 Long term (current) use of aspirin: Secondary | ICD-10-CM | POA: Insufficient documentation

## 2022-08-09 DIAGNOSIS — I4891 Unspecified atrial fibrillation: Secondary | ICD-10-CM

## 2022-08-09 DIAGNOSIS — R062 Wheezing: Secondary | ICD-10-CM | POA: Diagnosis not present

## 2022-08-09 DIAGNOSIS — I10 Essential (primary) hypertension: Secondary | ICD-10-CM | POA: Insufficient documentation

## 2022-08-09 DIAGNOSIS — R002 Palpitations: Secondary | ICD-10-CM | POA: Diagnosis not present

## 2022-08-09 DIAGNOSIS — Z79899 Other long term (current) drug therapy: Secondary | ICD-10-CM | POA: Diagnosis not present

## 2022-08-09 DIAGNOSIS — N3001 Acute cystitis with hematuria: Secondary | ICD-10-CM | POA: Diagnosis not present

## 2022-08-09 DIAGNOSIS — E8770 Fluid overload, unspecified: Secondary | ICD-10-CM

## 2022-08-09 DIAGNOSIS — I7121 Aneurysm of the ascending aorta, without rupture: Secondary | ICD-10-CM | POA: Diagnosis not present

## 2022-08-09 DIAGNOSIS — Z8673 Personal history of transient ischemic attack (TIA), and cerebral infarction without residual deficits: Secondary | ICD-10-CM | POA: Insufficient documentation

## 2022-08-09 DIAGNOSIS — R339 Retention of urine, unspecified: Secondary | ICD-10-CM | POA: Diagnosis not present

## 2022-08-09 DIAGNOSIS — Z466 Encounter for fitting and adjustment of urinary device: Secondary | ICD-10-CM | POA: Diagnosis not present

## 2022-08-09 DIAGNOSIS — R600 Localized edema: Secondary | ICD-10-CM | POA: Diagnosis not present

## 2022-08-09 DIAGNOSIS — N320 Bladder-neck obstruction: Secondary | ICD-10-CM | POA: Diagnosis not present

## 2022-08-09 DIAGNOSIS — R0602 Shortness of breath: Secondary | ICD-10-CM

## 2022-08-09 DIAGNOSIS — B961 Klebsiella pneumoniae [K. pneumoniae] as the cause of diseases classified elsewhere: Secondary | ICD-10-CM | POA: Diagnosis not present

## 2022-08-09 DIAGNOSIS — R609 Edema, unspecified: Secondary | ICD-10-CM

## 2022-08-09 DIAGNOSIS — R69 Illness, unspecified: Secondary | ICD-10-CM | POA: Diagnosis not present

## 2022-08-09 DIAGNOSIS — E538 Deficiency of other specified B group vitamins: Secondary | ICD-10-CM | POA: Diagnosis not present

## 2022-08-09 DIAGNOSIS — N401 Enlarged prostate with lower urinary tract symptoms: Secondary | ICD-10-CM | POA: Diagnosis not present

## 2022-08-09 LAB — TROPONIN I (HIGH SENSITIVITY)
Troponin I (High Sensitivity): 5 ng/L (ref <18)
Troponin I (High Sensitivity): 5 ng/L (ref <18)

## 2022-08-09 LAB — BASIC METABOLIC PANEL
Anion gap: 11 (ref 5–15)
BUN: 19 mg/dL (ref 8–23)
CO2: 24 mmol/L (ref 22–32)
Calcium: 9.5 mg/dL (ref 8.9–10.3)
Chloride: 104 mmol/L (ref 98–111)
Creatinine, Ser: 0.78 mg/dL (ref 0.61–1.24)
GFR, Estimated: >60 mL/min (ref >60)
Glucose, Bld: 116 mg/dL — ABNORMAL HIGH (ref 70–99)
Potassium: 3.7 mmol/L (ref 3.5–5.1)
Sodium: 139 mmol/L (ref 135–145)

## 2022-08-09 LAB — CBC
HCT: 43.1 % (ref 39.0–52.0)
Hemoglobin: 14.7 g/dL (ref 13.0–17.0)
MCH: 31.6 pg (ref 26.0–34.0)
MCHC: 34.1 g/dL (ref 30.0–36.0)
MCV: 92.7 fL (ref 80.0–100.0)
Platelets: 338 10*3/uL (ref 150–400)
RBC: 4.65 MIL/uL (ref 4.22–5.81)
RDW: 14.3 % (ref 11.5–15.5)
WBC: 10.5 10*3/uL (ref 4.0–10.5)
nRBC: 0 % (ref 0.0–0.2)

## 2022-08-09 LAB — BRAIN NATRIURETIC PEPTIDE: B Natriuretic Peptide: 94.6 pg/mL (ref 0.0–100.0)

## 2022-08-09 LAB — MAGNESIUM: Magnesium: 1.9 mg/dL (ref 1.7–2.4)

## 2022-08-09 MED ORDER — SODIUM CHLORIDE 0.9 % IV BOLUS
500.0000 mL | Freq: Once | INTRAVENOUS | Status: AC
  Administered 2022-08-09: 500 mL via INTRAVENOUS

## 2022-08-09 MED ORDER — FUROSEMIDE 20 MG PO TABS: 20.0000 mg | ORAL_TABLET | Freq: Every day | ORAL | 0 refills | Status: DC

## 2022-08-09 MED ORDER — IOHEXOL 350 MG/ML SOLN
75.0000 mL | Freq: Once | INTRAVENOUS | Status: AC | PRN
  Administered 2022-08-09: 75 mL via INTRAVENOUS

## 2022-08-09 NOTE — ED Provider Triage Note (Signed)
Emergency Medicine Provider Triage Evaluation Note  Calvin [name] , a 77 y.o. male  was evaluated in triage.  Pt complains of exertional shortness of breath, palpitations over the past several days.  Also endorses peripheral edema worse in the past 3 days.  No prior history of A-fib or other irregular heart rhythms.  Not on anticoagulation.  Review of Systems  Positive: As above Negative: As above  Physical Exam  BP 121/85   Pulse (!) 114   Temp (!) 97.5 F (36.4 C) (Oral)   Resp 18   Ht 6' (1.829 m)   Wt 102.3 kg   SpO2 93%   BMI 30.59 kg/m  Gen:   Awake, no distress   Resp:  Normal effort  MSK:   Moves extremities without difficulty  Other:    Medical Decision Making  Medically screening exam initiated at 3:58 PM.  Appropriate orders placed.  Calvin [name] was informed that the remainder of the evaluation will be completed by another provider, this initial triage assessment does not replace that evaluation, and the importance of remaining in the ED until their evaluation is complete.    Evlyn Courier, PA-C 08/09/22 1559

## 2022-08-09 NOTE — ED Notes (Signed)
Repeat EKG done and given to Dr.Belfi.

## 2022-08-09 NOTE — ED Triage Notes (Signed)
Pt was sent to ED by [facility] nurse for SOB w/exertion and irregular heart beat. Pt denies CP, dizziness, weakness.

## 2022-08-09 NOTE — ED Provider Notes (Signed)
[facility] Provider Note   CSN: [id number] Arrival date & time: 08/09/22  1535     History  No chief complaint on file.   Calvin [name] is a 77 y.o. male.  Patient is a 77 year old male who presents with shortness of breath.  He says he has not really noticed it but when he was at physical therapy this morning, the physical therapist noted that he was short of breath.  He has noted over the last few days that his legs have been swelling and he no longer have shoes that fit.  He does not have any fever.  No significant cough or congestion.  No associated chest pain.  He went to his PCP office and there was concern that he was in A-fib with RVR and was sent here for further evaluation.  No prior known heart problems.  He was admitted in December of last year for CVA with urinary retention.  He has a history of hypertension and hyperlipidemia.       Home Medications Prior to Admission medications   Medication Sig Start Date End Date Taking? Authorizing Provider  furosemide (LASIX) 20 MG tablet Take 1 tablet (20 mg total) by mouth daily. 08/09/22  Yes Malvin Johns, MD  amLODipine (NORVASC) 5 MG tablet TAKE 1 TABLET (5 MG TOTAL) BY MOUTH DAILY. 05/03/22   Laurey Morale, MD  aspirin EC 81 MG tablet Take 1 tablet (81 mg total) by mouth daily. Swallow whole. 06/01/22   Raiford Noble Latif, DO  atorvastatin (LIPITOR) 40 MG tablet Take 1 tablet (40 mg total) by mouth daily. 06/01/22   Sheikh, Omair Latif, DO  cephALEXin (KEFLEX) 500 MG capsule Take 1 capsule (500 mg total) by mouth 2 (two) times daily. 07/10/22   Larene Pickett, PA-C  cyanocobalamin (VITAMIN B12) 1000 MCG/ML injection INJECT 1 ML (1,000 MCG TOTAL) INTO THE MUSCLE ONCE A WEEK. 03/13/22   Laurey Morale, MD  cyclobenzaprine (FLEXERIL) 10 MG tablet Take 10 mg by mouth 3 (three) times daily as needed for muscle spasms. 03/04/22   [provider]  finasteride (PROSCAR) 5 MG  tablet Take 1 tablet (5 mg total) by mouth daily. 04/25/21   Laurey Morale, MD  folic acid (FOLVITE) 1 MG tablet Take 1 tablet (1 mg total) by mouth daily. 06/01/22   Raiford Noble Latif, DO  hydrochlorothiazide (HYDRODIURIL) 25 MG tablet Take 25 mg by mouth daily. 07/16/22   [provider]  KLOR-CON M10 10 MEQ tablet Take 10 mEq by mouth 2 (two) times daily. 03/22/22   [provider]  loperamide (IMODIUM) 2 MG capsule Take 1 capsule (2 mg total) by mouth every 8 (eight) hours as needed for diarrhea or loose stools. 03/21/22   Aline August, MD  losartan (COZAAR) 100 MG tablet TAKE 1 TABLET BY MOUTH EVERY DAY 05/03/22   Laurey Morale, MD  Multiple Vitamin (MULTIVITAMIN WITH MINERALS) TABS tablet Take 1 tablet by mouth daily. 06/01/22   Sheikh, Omair Latif, DO  ondansetron (ZOFRAN) 4 MG tablet Take 4 mg by mouth every 8 (eight) hours as needed for nausea or vomiting.    [provider]  potassium chloride (KLOR-CON) 10 MEQ tablet Take 10 mEq by mouth 2 (two) times daily. 07/16/22   [provider]  sertraline (ZOLOFT) 100 MG tablet TAKE 1 TABLET BY MOUTH EVERY DAY 07/16/22   Laurey Morale, MD  sulfamethoxazole-trimethoprim (BACTRIM DS) 800-160 MG tablet Take  1 tablet by mouth 2 (two) times daily.    [provider]  SYRINGE-NEEDLE, DISP, 3 ML (BD ECLIPSE SYRINGE/NEEDLE) 25G X 5/8" 3 ML MISC Use as directed once a week 12/26/21   Laurey Morale, MD  tamsulosin (FLOMAX) 0.4 MG CAPS capsule TAKE 1 CAPSULE BY MOUTH EVERY DAY 05/03/22   Laurey Morale, MD  thiamine (VITAMIN B-1) 100 MG tablet Take 1 tablet (100 mg total) by mouth daily. 06/01/22   Kerney Elbe, DO      Allergies    Patient has no known allergies.    Review of Systems   Review of Systems  Constitutional:  Negative for chills, diaphoresis, fatigue and fever.  HENT:  Negative for congestion, rhinorrhea and sneezing.   Eyes: Negative.   Respiratory:  Positive for shortness of breath.  Negative for cough and chest tightness.   Cardiovascular:  Positive for leg swelling. Negative for chest pain.  Gastrointestinal:  Negative for abdominal pain, blood in stool, diarrhea, nausea and vomiting.  Genitourinary:  Negative for difficulty urinating, flank pain, frequency and hematuria.  Musculoskeletal:  Negative for arthralgias and back pain.  Skin:  Negative for rash.  Neurological:  Negative for dizziness, speech difficulty, weakness, numbness and headaches.    Physical Exam Updated Vital Signs BP 120/78   Pulse 99   Temp (!) 97.5 F (36.4 C)   Resp (!) 21   Ht 6' (1.829 m)   Wt 102.3 kg   SpO2 93%   BMI 30.59 kg/m  Physical Exam Constitutional:      Appearance: He is well-developed.  HENT:     Head: Normocephalic and atraumatic.  Eyes:     Pupils: Pupils are equal, round, and reactive to light.  Cardiovascular:     Rate and Rhythm: Normal rate. Rhythm irregular.     Heart sounds: Normal heart sounds.  Pulmonary:     Effort: Pulmonary effort is normal. No respiratory distress.     Breath sounds: Normal breath sounds. No wheezing or rales.  Chest:     Chest wall: No tenderness.  Abdominal:     General: Bowel sounds are normal.     Palpations: Abdomen is soft.     Tenderness: There is no abdominal tenderness. There is no guarding or rebound.  Musculoskeletal:        General: Normal range of motion.     Cervical back: Normal range of motion and neck supple.     Comments: 3+ pitting edema to lower extremities bilaterally  Lymphadenopathy:     Cervical: No cervical adenopathy.  Skin:    General: Skin is warm and dry.     Findings: No rash.  Neurological:     Mental Status: He is alert and oriented to person, place, and time.     ED Results / Procedures / Treatments   Labs (all labs ordered are listed, but only abnormal results are displayed) Labs Reviewed  BASIC METABOLIC PANEL - Abnormal; Notable for the following components:      Result Value    Glucose, Bld 116 (*)    All other components within normal limits  CBC  BRAIN NATRIURETIC PEPTIDE  MAGNESIUM  TROPONIN I (HIGH SENSITIVITY)  TROPONIN I (HIGH SENSITIVITY)    EKG EKG Interpretation  Date/Time:  Thursday August 09 2022 22:27:48 EST Ventricular Rate:  98 PR Interval:  160 QRS Duration: 89 QT Interval:  381 QTC Calculation: 487 R Axis:   5 Text Interpretation: Sinus arrhythmia Multiform ventricular  premature complexes Low voltage, precordial leads Abnormal R-wave progression, early transition Borderline prolonged QT interval Confirmed by Malvin Johns [phone]) on 08/09/2022 11:03:58 PM  Radiology CT Angio Chest PE W/Cm &/Or Wo Cm  Result Date: 08/09/2022 CLINICAL DATA:  Shortness of breath and palpitations. EXAM: CT ANGIOGRAPHY CHEST WITH CONTRAST TECHNIQUE: Multidetector CT imaging of the chest was performed using the standard protocol during bolus administration of intravenous contrast. Multiplanar CT image reconstructions and MIPs were obtained to evaluate the vascular anatomy. RADIATION DOSE REDUCTION: This exam was performed according to the departmental dose-optimization program which includes automated exposure control, adjustment of the mA and/or kV according to patient size and/or use of iterative reconstruction technique. CONTRAST:  45m OMNIPAQUE IOHEXOL 350 MG/ML SOLN COMPARISON:  CT abdomen and pelvis with contrast 06/21/2022. AP and lateral chest today. Portable chest 05/27/2022 FINDINGS: Cardiovascular: There is mild cardiomegaly with a left chamber predominance. The pulmonary veins are decompressed. There is a small pericardial effusion. Two-vessel patchy calcifications LAD and circumflex coronary arteries. The pulmonary arteries are normal caliber without evidence of thromboemboli. There is aortic atherosclerosis and scattered calcific plaques in the great vessels, aortic tortuosity, and aneurysmal ectasia in the aortic root and ascending segment both of which  measure 4.2 cm. There is no dissection or stenosis. Mediastinum/Nodes: No enlarged mediastinal, hilar, or axillary lymph nodes. The lower poles of the thyroid gland, trachea, and esophagus demonstrate no significant findings. The main bronchi are patent. Lungs/Pleura: No pleural effusion or pneumothorax. Chronic moderate elevation right hemidiaphragm. There is diffuse bronchial thickening without visible bronchial plugging. There is a small left posterior diaphragmatic fat herniation. The lungs show posterior atelectasis, calcified granulomas and scarring in the anterior right middle lobe base. In the right lower lobe alongside the diaphragm there is a chronic 7 mm nodule which is unchanged dating back to 2017 abdomen pelvis CT, at this point can be assumed benign. No other nodules or active infiltrates are seen. Upper Abdomen: Multiple chronic hepatic cysts. No acute abnormality. Stable chronic 2.3 cm right adrenal adenoma, Hounsfield density of 5. Musculoskeletal: Osteopenia and degenerative change thoracic spine with mild dextroscoliosis. No suspicious osseous lesion or chest wall mass. Review of the MIP images confirms the above findings. IMPRESSION: 1. No evidence of arterial dilatation or embolus. 2. Cardiomegaly with small pericardial effusion. 3. Aortic and coronary artery atherosclerosis. 4. Aneurysmal ectasia of the aortic root and ascending segment, both of which measure 4.2 cm. No dissection or stenosis. Recommend annual imaging followup by CTA or MRA. This recommendation follows 2010 ACCF/AHA/AATS/ACR/ASA/SCA/SCAI/SIR/STS/SVM Guidelines for the Diagnosis and Management of Patients with Thoracic Aortic Disease. Circulation. 2010; 121:[id number] Aortic aneurysm NOS (ICD10-I71.9). 5. Bronchitis without visible bronchial plugging. 6. Stable 7 mm right lower lobe nodule dating back to 2017 abdomen pelvis CT, at this point can be assumed benign. 7. Stable 2.3 cm right adrenal adenoma. 8. Osteopenia and  degenerative change. Aortic Atherosclerosis (ICD10-I70.0). Electronically Signed   By: KTelford NabM.D.   On: 08/09/2022 23:24   DG Chest 2 View  Result Date: 08/09/2022 CLINICAL DATA:  Shortness of breath EXAM: CHEST - 2 VIEW COMPARISON:  CXR 05/27/22 FINDINGS: No pleural effusion. No pneumothorax. No focal airspace opacity. Normal cardiac and mediastinal contours. No radiographically apparent displaced rib fractures. Visualized upper abdomen is unremarkable. Vertebral body heights are maintained. IMPRESSION: No focal airspace opacity. Electronically Signed   By: HMarin RobertsM.D.   On: 08/09/2022 16:25    Procedures Procedures    Medications Ordered in ED Medications  sodium chloride 0.9 % bolus 500 mL (0 mLs Intravenous Stopped 08/09/22 2322)  iohexol (OMNIPAQUE) 350 MG/ML injection 75 mL (75 mLs Intravenous Contrast Given 08/09/22 2247)  sodium chloride 0.9 % bolus 500 mL (500 mLs Intravenous New Bag/Given 08/09/22 2325)    ED Course/ Medical Decision Making/ A&P                             Medical Decision Making Amount and/or Complexity of Data Reviewed Labs: ordered. Radiology: ordered.  Risk Prescription drug management.   Patient is a 77 year old who presents with shortness of breath although he said he did not note any significant shortness of breath but his physical therapist noted that he felt short of breath.  He also has some lower extremity swelling.  His EKG was was reported to be A-fib with RVR, my interpretation appears to be more of a sinus rhythm with lots of PACs.  I checked 3 different EKGs and some able to see the regular P waves although it is irregular which I feel is more likely due to PACs.  I do not see any EKGs that are showing A-fib currently.  His heart rate currently is in the 90s.  His chest x-ray was interpreted by me and confirmed by the radiologist to show no evidence of edema.  No pneumonia.  No other acute abnormality.  His EKG does not show any  ischemic changes.  His troponins are negative.  His BNP is normal.  Given his potential shortness of breath, CT was performed which shows no evidence of PE.  No other acute abnormalities.  There is a small thoracic aneurysm which will need outpatient follow-up.  I discussed this with him.  He currently is denying any shortness of breath.  He does have some lower extremity edema and we will go ahead and place him on a 3-day course of Lasix.  He is already on potassium and supplementation.  His creatinine was normal.  I do not see any concerns that would warrant inpatient hospitalization.  He is otherwise well-appearing.  He was discharged home in good condition.  He was encouraged to follow-up with his primary care doctor.  He is also given a referral to follow-up with cardiology and CT surgery regarding follow-up of his aneurysm.  Strict return precautions were given.  Final Clinical Impression(s) / ED Diagnoses Final diagnoses:  Shortness of breath  Peripheral edema  Aneurysm of ascending aorta without rupture ([facility])    Rx / DC Orders ED Discharge Orders          Ordered    furosemide (LASIX) 20 MG tablet  Daily        08/09/22 2346              Malvin Johns, MD 08/09/22 2350

## 2022-08-09 NOTE — ED Notes (Signed)
Pt transported to xray 

## 2022-08-09 NOTE — Progress Notes (Signed)
Established Patient Office Visit     CC/Reason for Visit: Shortness of breath, edema, fast heart rate  HPI: Calvin [name] is a 77 y.o. male who is coming in today for the above mentioned reasons. Past Medical History is significant for: Depression, depression.  He was hospitalized in December for an acute CVA and urinary retention likely related to an enlarged prostate with bladder outlet obstruction.  He has had multiple visits to the emergency department since with urinary obstruction.  He states that over the course of the last week he has noted progressive lower extremity edema.  He comes in today barefoot as he has no shoes that fit him.  He has also noted progressive dyspnea on exertion with his home physical therapy regimen.  Today he mentions that for the past day or 2 he has noted palpitations.   Past Medical/Surgical History: Past Medical History:  Diagnosis Date   Benign prostatic hypertrophy    COVID-19 virus infection 02/24/2020   ED (erectile dysfunction)    Headache(784.0)    Hypertension    Hypogonadism male    Low back pain 06/09/2020   Nephrolithiasis    hx of had lithotripsy in 1-10.   NEPHROLITHIASIS, HX OF 10/25/2008   Qualifier: Diagnosis of   By: Sarajane Jews MD, Ishmael Holter      Pyelonephritis 02/25/2022    Past Surgical History:  Procedure Laterality Date   colonoscopy  10/05/2020   per Dr. Fuller Plan, adenomatous polyps, repeat in 3 yrs   [place]  03/12/2012   Procedure: APPENDECTOMY LAPAROSCOPIC;  Surgeon: Harl Bowie, MD;  Location: [facility];  Service: General;  Laterality: N/A;   STAPLE HEMORRHOIDECTOMY  04/14/2010   per Dr. Johney Maine    TONSILLECTOMY      Social History:  reports that he has never smoked. He has never used smokeless tobacco. He reports that he does not currently use alcohol after a past usage of about 14.0 standard drinks of alcohol per week. He reports that he does not use drugs.  Allergies: No  Known Allergies  Family History:  Family History  Problem Relation Age of Onset   Heart Problems Mother        Caused by a Virus when she was a teen   Stroke Father    Prostate cancer Father    Hypertension Other    Stroke Other    Heart disease Other        rheumatic    Coronary artery disease Other    Healthy Child    Colon cancer Neg Hx    Colon polyps Neg Hx    Esophageal cancer Neg Hx    Rectal cancer Neg Hx    Stomach cancer Neg Hx      Current Outpatient Medications:    amLODipine (NORVASC) 5 MG tablet, TAKE 1 TABLET (5 MG TOTAL) BY MOUTH DAILY., Disp: 90 tablet, Rfl: 3   aspirin EC 81 MG tablet, Take 1 tablet (81 mg total) by mouth daily. Swallow whole., Disp: 30 tablet, Rfl: 12   atorvastatin (LIPITOR) 40 MG tablet, Take 1 tablet (40 mg total) by mouth daily., Disp: 30 tablet, Rfl: 0   cephALEXin (KEFLEX) 500 MG capsule, Take 1 capsule (500 mg total) by mouth 2 (two) times daily., Disp: 20 capsule, Rfl: 0   cyanocobalamin (VITAMIN B12) 1000 MCG/ML injection, INJECT 1 ML (1,000 MCG TOTAL) INTO THE MUSCLE ONCE A WEEK., Disp: 12 mL, Rfl: 1  cyclobenzaprine (FLEXERIL) 10 MG tablet, Take 10 mg by mouth 3 (three) times daily as needed for muscle spasms., Disp: , Rfl:    finasteride (PROSCAR) 5 MG tablet, Take 1 tablet (5 mg total) by mouth daily., Disp: 90 tablet, Rfl: 3   folic acid (FOLVITE) 1 MG tablet, Take 1 tablet (1 mg total) by mouth daily., Disp: 30 tablet, Rfl: 0   hydrochlorothiazide (HYDRODIURIL) 25 MG tablet, Take 25 mg by mouth daily., Disp: , Rfl:    KLOR-CON M10 10 MEQ tablet, Take 10 mEq by mouth 2 (two) times daily., Disp: , Rfl:    loperamide (IMODIUM) 2 MG capsule, Take 1 capsule (2 mg total) by mouth every 8 (eight) hours as needed for diarrhea or loose stools., Disp: 30 capsule, Rfl: 0   losartan (COZAAR) 100 MG tablet, TAKE 1 TABLET BY MOUTH EVERY DAY, Disp: 90 tablet, Rfl: 3   Multiple Vitamin (MULTIVITAMIN WITH MINERALS) TABS tablet, Take 1 tablet by  mouth daily., Disp: 30 tablet, Rfl: 0   ondansetron (ZOFRAN) 4 MG tablet, Take 4 mg by mouth every 8 (eight) hours as needed for nausea or vomiting., Disp: , Rfl:    potassium chloride (KLOR-CON) 10 MEQ tablet, Take 10 mEq by mouth 2 (two) times daily., Disp: , Rfl:    sertraline (ZOLOFT) 100 MG tablet, TAKE 1 TABLET BY MOUTH EVERY DAY, Disp: 90 tablet, Rfl: 0   sulfamethoxazole-trimethoprim (BACTRIM DS) 800-160 MG tablet, Take 1 tablet by mouth 2 (two) times daily., Disp: , Rfl:    SYRINGE-NEEDLE, DISP, 3 ML (BD ECLIPSE SYRINGE/NEEDLE) 25G X 5/8" 3 ML MISC, Use as directed once a week, Disp: 12 each, Rfl: 0   tamsulosin (FLOMAX) 0.4 MG CAPS capsule, TAKE 1 CAPSULE BY MOUTH EVERY DAY, Disp: 90 capsule, Rfl: 3   thiamine (VITAMIN B-1) 100 MG tablet, Take 1 tablet (100 mg total) by mouth daily., Disp: 30 tablet, Rfl: 0  Review of Systems:  Negative unless indicated in HPI.   Physical Exam: Vitals:   08/09/22 1410 08/09/22 1411  BP: (!) 180/120 (!) 147/95  Pulse: 64   Temp: 98.6 F (37 C)   TempSrc: Oral   SpO2: 98%   Weight: 225 lb 8 oz (102.3 kg)     Body mass index is 30.58 kg/m.   Physical Exam Vitals reviewed.  Constitutional:      Appearance: Normal appearance.  HENT:     Head: Normocephalic and atraumatic.  Eyes:     Conjunctiva/sclera: Conjunctivae normal.     Pupils: Pupils are equal, round, and reactive to light.  Cardiovascular:     Rate and Rhythm: Tachycardia present. Rhythm irregular.  Pulmonary:     Effort: Pulmonary effort is normal. Tachypnea present.     Comments: Bilateral crackles Musculoskeletal:     Right lower leg: 4+ Pitting Edema present.     Left lower leg: 4+ Pitting Edema present.  Skin:    General: Skin is warm and dry.  Neurological:     General: No focal deficit present.     Mental Status: He is alert and oriented to person, place, and time.  Psychiatric:        Mood and Affect: Mood normal.        Behavior: Behavior normal.         Thought Content: Thought content normal.        Judgment: Judgment normal.      Impression and Plan:  Shortness of breath - Plan: EKG 12-Lead  Atrial  fibrillation with RVR (HCC)  Essential hypertension  Hypervolemia, unspecified hypervolemia type  Bilateral lower extremity edema  -EKG done in office today shows atrial fibrillation at a rate of around 126.  This would be new onset.  Interestingly he was admitted to the hospital for an acute ischemic CVA in December 2023. -He is significantly volume overloaded with 4+ pitting edema and dyspnea on exertion. -Have advised ED evaluation today.  Time spent:45 minutes reviewing chart, interviewing and examining patient and formulating plan of care.     Lelon Frohlich, MD [facility]

## 2022-08-10 ENCOUNTER — Telehealth: Payer: Self-pay | Admitting: Family Medicine

## 2022-08-10 NOTE — Telephone Encounter (Signed)
Zacharia from [facility] call and stated she need  order for Occupational Therapy 1 x a wk for 4 wk's to include ADL,IADL Therapeutic exercise and active and transfer.Philis Nettle "s # is [phone].

## 2022-08-13 NOTE — Telephone Encounter (Signed)
Please okay these orders  ?

## 2022-08-13 NOTE — Telephone Encounter (Signed)
Spoke with Philis Nettle with [facility] advised the VO requested have been approved by Dr Sarajane Jews

## 2022-08-14 DIAGNOSIS — R338 Other retention of urine: Secondary | ICD-10-CM | POA: Diagnosis not present

## 2022-08-14 DIAGNOSIS — N312 Flaccid neuropathic bladder, not elsewhere classified: Secondary | ICD-10-CM | POA: Diagnosis not present

## 2022-08-14 DIAGNOSIS — N401 Enlarged prostate with lower urinary tract symptoms: Secondary | ICD-10-CM | POA: Diagnosis not present

## 2022-08-15 ENCOUNTER — Ambulatory Visit: Payer: Medicare HMO | Admitting: Family Medicine

## 2022-08-15 ENCOUNTER — Ambulatory Visit: Payer: Medicare HMO | Admitting: Cardiovascular Disease

## 2022-08-15 DIAGNOSIS — Z8673 Personal history of transient ischemic attack (TIA), and cerebral infarction without residual deficits: Secondary | ICD-10-CM

## 2022-08-15 DIAGNOSIS — R339 Retention of urine, unspecified: Secondary | ICD-10-CM | POA: Diagnosis not present

## 2022-08-15 DIAGNOSIS — R69 Illness, unspecified: Secondary | ICD-10-CM | POA: Diagnosis not present

## 2022-08-15 DIAGNOSIS — I10 Essential (primary) hypertension: Secondary | ICD-10-CM

## 2022-08-15 DIAGNOSIS — F32A Depression, unspecified: Secondary | ICD-10-CM | POA: Diagnosis not present

## 2022-08-15 DIAGNOSIS — E538 Deficiency of other specified B group vitamins: Secondary | ICD-10-CM

## 2022-08-15 DIAGNOSIS — N3001 Acute cystitis with hematuria: Secondary | ICD-10-CM

## 2022-08-15 DIAGNOSIS — Z7982 Long term (current) use of aspirin: Secondary | ICD-10-CM

## 2022-08-15 DIAGNOSIS — N401 Enlarged prostate with lower urinary tract symptoms: Secondary | ICD-10-CM | POA: Diagnosis not present

## 2022-08-15 DIAGNOSIS — B961 Klebsiella pneumoniae [K. pneumoniae] as the cause of diseases classified elsewhere: Secondary | ICD-10-CM

## 2022-08-15 DIAGNOSIS — Z466 Encounter for fitting and adjustment of urinary device: Secondary | ICD-10-CM | POA: Diagnosis not present

## 2022-08-15 DIAGNOSIS — N320 Bladder-neck obstruction: Secondary | ICD-10-CM

## 2022-08-15 DIAGNOSIS — F418 Other specified anxiety disorders: Secondary | ICD-10-CM

## 2022-08-17 ENCOUNTER — Telehealth: Payer: Self-pay

## 2022-08-17 DIAGNOSIS — B961 Klebsiella pneumoniae [K. pneumoniae] as the cause of diseases classified elsewhere: Secondary | ICD-10-CM | POA: Diagnosis not present

## 2022-08-17 DIAGNOSIS — I10 Essential (primary) hypertension: Secondary | ICD-10-CM | POA: Diagnosis not present

## 2022-08-17 DIAGNOSIS — R69 Illness, unspecified: Secondary | ICD-10-CM | POA: Diagnosis not present

## 2022-08-17 DIAGNOSIS — N3001 Acute cystitis with hematuria: Secondary | ICD-10-CM | POA: Diagnosis not present

## 2022-08-17 DIAGNOSIS — Z466 Encounter for fitting and adjustment of urinary device: Secondary | ICD-10-CM | POA: Diagnosis not present

## 2022-08-17 DIAGNOSIS — Z8673 Personal history of transient ischemic attack (TIA), and cerebral infarction without residual deficits: Secondary | ICD-10-CM | POA: Diagnosis not present

## 2022-08-17 DIAGNOSIS — N401 Enlarged prostate with lower urinary tract symptoms: Secondary | ICD-10-CM | POA: Diagnosis not present

## 2022-08-17 DIAGNOSIS — R339 Retention of urine, unspecified: Secondary | ICD-10-CM | POA: Diagnosis not present

## 2022-08-17 DIAGNOSIS — E538 Deficiency of other specified B group vitamins: Secondary | ICD-10-CM | POA: Diagnosis not present

## 2022-08-17 DIAGNOSIS — N320 Bladder-neck obstruction: Secondary | ICD-10-CM | POA: Diagnosis not present

## 2022-08-17 DIAGNOSIS — Z7982 Long term (current) use of aspirin: Secondary | ICD-10-CM | POA: Diagnosis not present

## 2022-08-17 NOTE — Telephone Encounter (Signed)
        Patient  visited [facility] on 2/23   Telephone encounter attempt :  1st  A HIPAA compliant voice message was left requesting a return call.  Instructed patient to call back.    Derrich Gaby [facility], [place] [phone] [address], [place], [place] [address] Phone: [phone] Email: [email or web address]       

## 2022-08-20 ENCOUNTER — Telehealth: Payer: Self-pay

## 2022-08-20 NOTE — Telephone Encounter (Signed)
     Patient  visit on 2/23  at [facility]  Have you been able to follow up with your primary care physician? No  The patient was or was not able to obtain any needed medicine or equipment. Yes    Are there diet recommendations that you are having difficulty following? Na   Patient expresses understanding of discharge instructions and education provided has no other needs at this time.  Yes      [facility] [phone] [address], [place], [place] [address] Phone: [phone] Email: [email or web address]

## 2022-08-21 DIAGNOSIS — E538 Deficiency of other specified B group vitamins: Secondary | ICD-10-CM | POA: Diagnosis not present

## 2022-08-21 DIAGNOSIS — N3001 Acute cystitis with hematuria: Secondary | ICD-10-CM | POA: Diagnosis not present

## 2022-08-21 DIAGNOSIS — Z466 Encounter for fitting and adjustment of urinary device: Secondary | ICD-10-CM | POA: Diagnosis not present

## 2022-08-21 DIAGNOSIS — N401 Enlarged prostate with lower urinary tract symptoms: Secondary | ICD-10-CM | POA: Diagnosis not present

## 2022-08-21 DIAGNOSIS — Z7982 Long term (current) use of aspirin: Secondary | ICD-10-CM | POA: Diagnosis not present

## 2022-08-21 DIAGNOSIS — R69 Illness, unspecified: Secondary | ICD-10-CM | POA: Diagnosis not present

## 2022-08-21 DIAGNOSIS — R339 Retention of urine, unspecified: Secondary | ICD-10-CM | POA: Diagnosis not present

## 2022-08-21 DIAGNOSIS — I10 Essential (primary) hypertension: Secondary | ICD-10-CM | POA: Diagnosis not present

## 2022-08-21 DIAGNOSIS — N320 Bladder-neck obstruction: Secondary | ICD-10-CM | POA: Diagnosis not present

## 2022-08-21 DIAGNOSIS — Z8673 Personal history of transient ischemic attack (TIA), and cerebral infarction without residual deficits: Secondary | ICD-10-CM | POA: Diagnosis not present

## 2022-08-21 DIAGNOSIS — B961 Klebsiella pneumoniae [K. pneumoniae] as the cause of diseases classified elsewhere: Secondary | ICD-10-CM | POA: Diagnosis not present

## 2022-08-23 ENCOUNTER — Telehealth: Payer: Self-pay | Admitting: Family Medicine

## 2022-08-23 NOTE — Telephone Encounter (Signed)
Home health physical therapy 1x3 with recertification

## 2022-08-24 NOTE — Telephone Encounter (Signed)
Please okay the orders  

## 2022-08-24 NOTE — Telephone Encounter (Signed)
Spoke wit Monique with [facility], advised that VO to recertification have been approved

## 2022-08-26 DIAGNOSIS — S82832S Other fracture of upper and lower end of left fibula, sequela: Secondary | ICD-10-CM | POA: Diagnosis not present

## 2022-08-26 DIAGNOSIS — M6281 Muscle weakness (generalized): Secondary | ICD-10-CM | POA: Diagnosis not present

## 2022-08-26 DIAGNOSIS — R2681 Unsteadiness on feet: Secondary | ICD-10-CM | POA: Diagnosis not present

## 2022-08-26 DIAGNOSIS — R262 Difficulty in walking, not elsewhere classified: Secondary | ICD-10-CM | POA: Diagnosis not present

## 2022-08-26 DIAGNOSIS — Z9181 History of falling: Secondary | ICD-10-CM | POA: Diagnosis not present

## 2022-08-26 DIAGNOSIS — R296 Repeated falls: Secondary | ICD-10-CM | POA: Diagnosis not present

## 2022-08-27 ENCOUNTER — Telehealth: Payer: Self-pay | Admitting: Family Medicine

## 2022-08-27 NOTE — Telephone Encounter (Signed)
Laurel with [facility] to leave a detailed message on this line   Verbal Order:  Would like to move visit to 09/10/22  Please advise.

## 2022-08-28 DIAGNOSIS — R339 Retention of urine, unspecified: Secondary | ICD-10-CM | POA: Diagnosis not present

## 2022-08-28 DIAGNOSIS — I10 Essential (primary) hypertension: Secondary | ICD-10-CM | POA: Diagnosis not present

## 2022-08-28 DIAGNOSIS — Z466 Encounter for fitting and adjustment of urinary device: Secondary | ICD-10-CM | POA: Diagnosis not present

## 2022-08-28 DIAGNOSIS — Z7982 Long term (current) use of aspirin: Secondary | ICD-10-CM | POA: Diagnosis not present

## 2022-08-28 DIAGNOSIS — B961 Klebsiella pneumoniae [K. pneumoniae] as the cause of diseases classified elsewhere: Secondary | ICD-10-CM | POA: Diagnosis not present

## 2022-08-28 DIAGNOSIS — N401 Enlarged prostate with lower urinary tract symptoms: Secondary | ICD-10-CM | POA: Diagnosis not present

## 2022-08-28 DIAGNOSIS — N3001 Acute cystitis with hematuria: Secondary | ICD-10-CM | POA: Diagnosis not present

## 2022-08-28 DIAGNOSIS — N320 Bladder-neck obstruction: Secondary | ICD-10-CM | POA: Diagnosis not present

## 2022-08-28 DIAGNOSIS — Z8673 Personal history of transient ischemic attack (TIA), and cerebral infarction without residual deficits: Secondary | ICD-10-CM | POA: Diagnosis not present

## 2022-08-28 DIAGNOSIS — E538 Deficiency of other specified B group vitamins: Secondary | ICD-10-CM | POA: Diagnosis not present

## 2022-08-28 DIAGNOSIS — R69 Illness, unspecified: Secondary | ICD-10-CM | POA: Diagnosis not present

## 2022-08-28 NOTE — Telephone Encounter (Signed)
Please okay the order

## 2022-08-28 NOTE — Telephone Encounter (Signed)
Left a detailed message on Calvin [name]'s voicemail with the approval for orders as below.

## 2022-08-29 DIAGNOSIS — B961 Klebsiella pneumoniae [K. pneumoniae] as the cause of diseases classified elsewhere: Secondary | ICD-10-CM | POA: Diagnosis not present

## 2022-08-29 DIAGNOSIS — Z466 Encounter for fitting and adjustment of urinary device: Secondary | ICD-10-CM | POA: Diagnosis not present

## 2022-08-29 DIAGNOSIS — Z8673 Personal history of transient ischemic attack (TIA), and cerebral infarction without residual deficits: Secondary | ICD-10-CM | POA: Diagnosis not present

## 2022-08-29 DIAGNOSIS — Z7982 Long term (current) use of aspirin: Secondary | ICD-10-CM | POA: Diagnosis not present

## 2022-08-29 DIAGNOSIS — N3001 Acute cystitis with hematuria: Secondary | ICD-10-CM | POA: Diagnosis not present

## 2022-08-29 DIAGNOSIS — N401 Enlarged prostate with lower urinary tract symptoms: Secondary | ICD-10-CM | POA: Diagnosis not present

## 2022-08-29 DIAGNOSIS — R69 Illness, unspecified: Secondary | ICD-10-CM | POA: Diagnosis not present

## 2022-08-29 DIAGNOSIS — E538 Deficiency of other specified B group vitamins: Secondary | ICD-10-CM | POA: Diagnosis not present

## 2022-08-29 DIAGNOSIS — R339 Retention of urine, unspecified: Secondary | ICD-10-CM | POA: Diagnosis not present

## 2022-08-29 DIAGNOSIS — I10 Essential (primary) hypertension: Secondary | ICD-10-CM | POA: Diagnosis not present

## 2022-08-29 DIAGNOSIS — N320 Bladder-neck obstruction: Secondary | ICD-10-CM | POA: Diagnosis not present

## 2022-08-30 ENCOUNTER — Encounter: Payer: Self-pay | Admitting: Cardiothoracic Surgery

## 2022-08-30 NOTE — Progress Notes (Deleted)
[address]       [place] [address]             [phone]      PCP is Laurey Morale, MD Referring Provider is Malvin Johns, MD   Reason for Consult: Evaluation and surveillance of thoracic aortic aneurysm  HPI: [name] is a 77 year old gentleman with past medical history significant for hypertension, dyslipidemia, CVA in December 2023 and osteoarthritis.  He presented to his primary care physician on 08/09/2022 with a primary complaint of shortness of breath and new onset swelling in his lower extremities.  He apparently was unable to wear shoes to this visit due to the degree of swelling in his feet.  He was felt to be in atrial fibrillation with RVR at the time of that office visit and was sent to the emergency room for further evaluation.  In the ED, an EKG showed sinus arrhythmia with frequent PACs but no evidence of atrial fibrillation on visual or follow-up EKGs during that visit.  High-sensitivity troponin was negative for ischemia.  BNP was also normal.  Trays showed no evidence of pulmonary edema no infiltrates.  The cardiac contours were normal.  Due to the complaint of shortness of breath, a CT scan of the chest was, however, 4.2 cm thoracic aortic aneurysm identified on the CT.  Calvin [name] was referred to CT surgery for evaluation and surveillance of this newly discovered lesion.  Past Medical History:  Diagnosis Date   Benign prostatic hypertrophy    COVID-19 virus infection 02/24/2020   ED (erectile dysfunction)    Headache(784.0)    Hypertension    Hypogonadism male    Low back pain 06/09/2020   Nephrolithiasis    hx of had lithotripsy in 1-10.   NEPHROLITHIASIS, HX OF 10/25/2008   Qualifier: Diagnosis of   By: Sarajane Jews MD, Ishmael Holter      Pyelonephritis 02/25/2022    Past Surgical History:  Procedure Laterality Date   colonoscopy  10/05/2020   per Dr. Fuller Plan, adenomatous polyps, repeat in 3 yrs   FOOT SURGERY     LAPAROSCOPIC  APPENDECTOMY  03/12/2012   Procedure: APPENDECTOMY LAPAROSCOPIC;  Surgeon: Harl Bowie, MD;  Location: [facility];  Service: General;  Laterality: N/A;   STAPLE HEMORRHOIDECTOMY  04/14/2010   per Dr. Johney Maine    TONSILLECTOMY      Family History  Problem Relation Age of Onset   Heart Problems Mother        Caused by a Virus when she was a teen   Stroke Father    Prostate cancer Father    Hypertension Other    Stroke Other    Heart disease Other        rheumatic    Coronary artery disease Other    Healthy Child    Colon cancer Neg Hx    Colon polyps Neg Hx    Esophageal cancer Neg Hx    Rectal cancer Neg Hx    Stomach cancer Neg Hx     Social History Social History   Tobacco Use   Smoking status: Never   Smokeless tobacco: Never  Vaping Use   Vaping Use: Never used  Substance Use Topics   Alcohol use: Not Currently    Alcohol/week: 14.0 standard drinks of alcohol    Types: 14 Standard drinks or equivalent per week    Comment: quit 3 weeks ago   Drug use: No  Current Outpatient Medications  Medication Sig Dispense Refill   amLODipine (NORVASC) 5 MG tablet TAKE 1 TABLET (5 MG TOTAL) BY MOUTH DAILY. 90 tablet 3   aspirin EC 81 MG tablet Take 1 tablet (81 mg total) by mouth daily. Swallow whole. 30 tablet 12   atorvastatin (LIPITOR) 40 MG tablet Take 1 tablet (40 mg total) by mouth daily. 30 tablet 0   cephALEXin (KEFLEX) 500 MG capsule Take 1 capsule (500 mg total) by mouth 2 (two) times daily. 20 capsule 0   cyanocobalamin (VITAMIN B12) 1000 MCG/ML injection INJECT 1 ML (1,000 MCG TOTAL) INTO THE MUSCLE ONCE A WEEK. 12 mL 1   cyclobenzaprine (FLEXERIL) 10 MG tablet Take 10 mg by mouth 3 (three) times daily as needed for muscle spasms.     finasteride (PROSCAR) 5 MG tablet Take 1 tablet (5 mg total) by mouth daily. 90 tablet 3   folic acid (FOLVITE) 1 MG tablet Take 1 tablet (1 mg total) by mouth daily. 30 tablet 0   furosemide (LASIX) 20 MG tablet Take 1 tablet (20  mg total) by mouth daily. 3 tablet 0   hydrochlorothiazide (HYDRODIURIL) 25 MG tablet Take 25 mg by mouth daily.     KLOR-CON M10 10 MEQ tablet Take 10 mEq by mouth 2 (two) times daily.     loperamide (IMODIUM) 2 MG capsule Take 1 capsule (2 mg total) by mouth every 8 (eight) hours as needed for diarrhea or loose stools. 30 capsule 0   losartan (COZAAR) 100 MG tablet TAKE 1 TABLET BY MOUTH EVERY DAY 90 tablet 3   Multiple Vitamin (MULTIVITAMIN WITH MINERALS) TABS tablet Take 1 tablet by mouth daily. 30 tablet 0   ondansetron (ZOFRAN) 4 MG tablet Take 4 mg by mouth every 8 (eight) hours as needed for nausea or vomiting.     potassium chloride (KLOR-CON) 10 MEQ tablet Take 10 mEq by mouth 2 (two) times daily.     sertraline (ZOLOFT) 100 MG tablet TAKE 1 TABLET BY MOUTH EVERY DAY 90 tablet 0   sulfamethoxazole-trimethoprim (BACTRIM DS) 800-160 MG tablet Take 1 tablet by mouth 2 (two) times daily.     SYRINGE-NEEDLE, DISP, 3 ML (BD ECLIPSE SYRINGE/NEEDLE) 25G X 5/8" 3 ML MISC Use as directed once a week 12 each 0   tamsulosin (FLOMAX) 0.4 MG CAPS capsule TAKE 1 CAPSULE BY MOUTH EVERY DAY 90 capsule 3   thiamine (VITAMIN B-1) 100 MG tablet Take 1 tablet (100 mg total) by mouth daily. 30 tablet 0   No current facility-administered medications for this visit.    No Known Allergies  Review of Systems: ***  There were no vitals taken for this visit. Physical Exam: ***  Diagnostic Tests: CLINICAL DATA:  Shortness of breath and palpitations.   EXAM: CT ANGIOGRAPHY CHEST WITH CONTRAST   TECHNIQUE: Multidetector CT imaging of the chest was performed using the standard protocol during bolus administration of intravenous contrast. Multiplanar CT image reconstructions and MIPs were obtained to evaluate the vascular anatomy.   RADIATION DOSE REDUCTION: This exam was performed according to the departmental dose-optimization program which includes automated exposure control, adjustment of the mA  and/or kV according to patient size and/or use of iterative reconstruction technique.   CONTRAST:  54m OMNIPAQUE IOHEXOL 350 MG/ML SOLN   COMPARISON:  CT abdomen and pelvis with contrast 06/21/2022. AP and lateral chest today. Portable chest 05/27/2022   FINDINGS: Cardiovascular: There is mild cardiomegaly with a left chamber predominance. The pulmonary veins are  decompressed.   There is a small pericardial effusion. Two-vessel patchy calcifications LAD and circumflex coronary arteries.   The pulmonary arteries are normal caliber without evidence of thromboemboli.   There is aortic atherosclerosis and scattered calcific plaques in the great vessels, aortic tortuosity, and aneurysmal ectasia in the aortic root and ascending segment both of which measure 4.2 cm. There is no dissection or stenosis.   Mediastinum/Nodes: No enlarged mediastinal, hilar, or axillary lymph nodes. The lower poles of the thyroid gland, trachea, and esophagus demonstrate no significant findings. The main bronchi are patent.   Lungs/Pleura: No pleural effusion or pneumothorax. Chronic moderate elevation right hemidiaphragm.   There is diffuse bronchial thickening without visible bronchial plugging. There is a small left posterior diaphragmatic fat herniation.   The lungs show posterior atelectasis, calcified granulomas and scarring in the anterior right middle lobe base.   In the right lower lobe alongside the diaphragm there is a chronic 7 mm nodule which is unchanged dating back to 2017 abdomen pelvis CT, at this point can be assumed benign.   No other nodules or active infiltrates are seen.   Upper Abdomen: Multiple chronic hepatic cysts. No acute abnormality. Stable chronic 2.3 cm right adrenal adenoma, Hounsfield density of 5.   Musculoskeletal: Osteopenia and degenerative change thoracic spine with mild dextroscoliosis. No suspicious osseous lesion or chest wall mass.   Review of the MIP  images confirms the above findings.   IMPRESSION: 1. No evidence of arterial dilatation or embolus. 2. Cardiomegaly with small pericardial effusion. 3. Aortic and coronary artery atherosclerosis. 4. Aneurysmal ectasia of the aortic root and ascending segment, both of which measure 4.2 cm. No dissection or stenosis. Recommend annual imaging followup by CTA or MRA. This recommendation follows 2010 ACCF/AHA/AATS/ACR/ASA/SCA/SCAI/SIR/STS/SVM Guidelines for the Diagnosis and Management of Patients with Thoracic Aortic Disease. Circulation. 2010; [id number]. Aortic aneurysm NOS (ICD10-I71.9). 5. Bronchitis without visible bronchial plugging. 6. Stable 7 mm right lower lobe nodule dating back to 2017 abdomen pelvis CT, at this point can be assumed benign. 7. Stable 2.3 cm right adrenal adenoma. 8. Osteopenia and degenerative change.   Aortic Atherosclerosis (ICD10-I70.0).     Electronically Signed   By: Telford Nab M.D.   On: 08/09/2022 23:24   ECHOCARDIOGRAM REPORT       Patient Name:   Calvin [name] Date of Exam: 05/28/2022  Medical Rec #:  [id number]      Height:       72.0 in  Accession #:    [id number]     Weight:       220.0 lb  Date of Birth:  [birth year]      BSA:          2.219 m  Patient Age:    53 years       BP:           143/103 mmHg  Patient Gender: M              HR:           107 bpm.  Exam Location:  Inpatient   Procedure: 2D Echo   Indications:    stroke    History:        Patient has prior history of Echocardiogram examinations,  most                 recent 07/20/2021. Risk Factors:Hypertension.    Sonographer:    Johny Chess RDCS  Referring Phys: [id number] [place]  Sonographer Comments: Technically difficult study due to poor echo  windows. Image acquisition challenging due to respiratory motion.  IMPRESSIONS     1. Left ventricular ejection fraction, by estimation, is 60 to 65%. The  left ventricle has normal function. The  left ventricle has no regional  wall motion abnormalities. There is mild concentric left ventricular  hypertrophy. Left ventricular diastolic  function could not be evaluated.   2. Right ventricular systolic function is normal. The right ventricular  size is normal.   3. The mitral valve is grossly normal. No evidence of mitral valve  regurgitation. No evidence of mitral stenosis.   4. The aortic valve was not well visualized. Aortic valve regurgitation  is not visualized. No aortic stenosis is present.   5. Aortic dilatation noted. There is mild dilatation of the ascending  aorta, measuring 41 mm.   6. The inferior vena cava is normal in size with greater than 50%  respiratory variability, suggesting right atrial pressure of 3 mmHg.   Comparison(s): No significant change from prior study.   Conclusion(s)/Recommendation(s): No intracardiac source of embolism  detected on this transthoracic study. Consider a transesophageal  echocardiogram to exclude cardiac source of embolism if clinically  indicated. Cannot exclude atrial fibrillation based on   tracings--recommend confirming on telemetry/ECG.   FINDINGS   Left Ventricle: Left ventricular ejection fraction, by estimation, is 60  to 65%. The left ventricle has normal function. The left ventricle has no  regional wall motion abnormalities. The left ventricular internal cavity  size was normal in size. There is   mild concentric left ventricular hypertrophy. Left ventricular diastolic  function could not be evaluated.   Right Ventricle: The right ventricular size is normal. No increase in  right ventricular wall thickness. Right ventricular systolic function is  normal.   Left Atrium: Left atrial size was normal in size.   Right Atrium: Right atrial size was normal in size.   Pericardium: There is no evidence of pericardial effusion.   Mitral Valve: The mitral valve is grossly normal. No evidence of mitral  valve  regurgitation. No evidence of mitral valve stenosis.   Tricuspid Valve: The tricuspid valve is grossly normal. Tricuspid valve  regurgitation is not demonstrated. No evidence of tricuspid stenosis.   Aortic Valve: The aortic valve was not well visualized. Aortic valve  regurgitation is not visualized. No aortic stenosis is present.   Pulmonic Valve: The pulmonic valve was not well visualized. Pulmonic valve  regurgitation is not visualized.   Aorta: Aortic dilatation noted. There is mild dilatation of the ascending  aorta, measuring 41 mm.   Venous: The inferior vena cava is normal in size with greater than 50%  respiratory variability, suggesting right atrial pressure of 3 mmHg.   IAS/Shunts: The interatrial septum was not well visualized.   Additional Comments: There is a small pleural effusion in the left lateral  region.     LEFT VENTRICLE  PLAX 2D  LVIDd:         4.40 cm  LVIDs:         2.90 cm  LV PW:         1.20 cm  LV IVS:        1.30 cm  LVOT diam:     2.30 cm  LV SV:         66  LV SV Index:   30  LVOT Area:     4.15 cm     IVC  IVC  diam: 1.80 cm   LEFT ATRIUM             Index        RIGHT ATRIUM           Index  LA diam:        3.90 cm 1.76 cm/m   RA Area:     14.50 cm  LA Vol (A2C):   53.8 ml 24.25 ml/m  RA Volume:   29.70 ml  13.39 ml/m  LA Vol (A4C):   42.7 ml 19.25 ml/m  LA Biplane Vol: 47.9 ml 21.59 ml/m   AORTIC VALVE  LVOT Vmax:   77.10 cm/s  LVOT Vmean:  51.400 cm/s  LVOT VTI:    0.160 m    AORTA  Ao Root diam: 3.50 cm  Ao Asc diam:  4.10 cm     SHUNTS  Systemic VTI:  0.16 m  Systemic Diam: 2.30 cm   Buford Dresser MD  Electronically signed by Buford Dresser MD  Signature Date/Time: 05/28/2022/2:13:05 PM        Final     Impression / Plan: 77 year old male with a history of hypertension and dyslipidemia was recently discovered on CT scan to have a 4.2 cm thoracic aortic aneurysm.  This is a fusiform aneurysm.   The echo showed no significant aortic valve abnormality although imaging windows were not optimal and the aortic valve was not well-visualized.  There is no indication for surgical intervention of this small thoracic aneurysm.  However, ongoing surveillance is recommended.  This was explained to Calvin [name].  We also recommend avoiding any strenuous activity to include lifting no more than 30 pounds.  His blood pressure appears to be well-controlled and he is already on statin therapy.  Recommend avoiding quinolone class of antibiotics due to their tendency to weaken connective tissue.  Will plan for repeat CTA in 1 year and follow-up office visit after that.      Antony Odea, PA-C [facility] [phone]

## 2022-08-31 DIAGNOSIS — R338 Other retention of urine: Secondary | ICD-10-CM | POA: Diagnosis not present

## 2022-08-31 DIAGNOSIS — R3914 Feeling of incomplete bladder emptying: Secondary | ICD-10-CM | POA: Diagnosis not present

## 2022-08-31 DIAGNOSIS — N312 Flaccid neuropathic bladder, not elsewhere classified: Secondary | ICD-10-CM | POA: Diagnosis not present

## 2022-08-31 DIAGNOSIS — N401 Enlarged prostate with lower urinary tract symptoms: Secondary | ICD-10-CM | POA: Diagnosis not present

## 2022-09-05 ENCOUNTER — Telehealth: Payer: Self-pay | Admitting: Family Medicine

## 2022-09-05 DIAGNOSIS — Z7982 Long term (current) use of aspirin: Secondary | ICD-10-CM | POA: Diagnosis not present

## 2022-09-05 DIAGNOSIS — N3001 Acute cystitis with hematuria: Secondary | ICD-10-CM | POA: Diagnosis not present

## 2022-09-05 DIAGNOSIS — Z8673 Personal history of transient ischemic attack (TIA), and cerebral infarction without residual deficits: Secondary | ICD-10-CM | POA: Diagnosis not present

## 2022-09-05 DIAGNOSIS — R69 Illness, unspecified: Secondary | ICD-10-CM | POA: Diagnosis not present

## 2022-09-05 DIAGNOSIS — N401 Enlarged prostate with lower urinary tract symptoms: Secondary | ICD-10-CM | POA: Diagnosis not present

## 2022-09-05 DIAGNOSIS — E538 Deficiency of other specified B group vitamins: Secondary | ICD-10-CM | POA: Diagnosis not present

## 2022-09-05 DIAGNOSIS — B961 Klebsiella pneumoniae [K. pneumoniae] as the cause of diseases classified elsewhere: Secondary | ICD-10-CM | POA: Diagnosis not present

## 2022-09-05 DIAGNOSIS — N320 Bladder-neck obstruction: Secondary | ICD-10-CM | POA: Diagnosis not present

## 2022-09-05 DIAGNOSIS — I10 Essential (primary) hypertension: Secondary | ICD-10-CM | POA: Diagnosis not present

## 2022-09-05 DIAGNOSIS — R339 Retention of urine, unspecified: Secondary | ICD-10-CM | POA: Diagnosis not present

## 2022-09-05 DIAGNOSIS — Z466 Encounter for fitting and adjustment of urinary device: Secondary | ICD-10-CM | POA: Diagnosis not present

## 2022-09-05 NOTE — Telephone Encounter (Signed)
Would like to discontinue OT,patient feels he does not need it any longer,  being discharged eff 09/05/22

## 2022-09-05 NOTE — Telephone Encounter (Signed)
Please okay this order  ?

## 2022-09-06 NOTE — Telephone Encounter (Signed)
Spoke with Sharyn Lull with [facility] advised per Dr Sarajane Jews approval to D/C or Discharge pt from OT, verbalized understanding

## 2022-09-10 ENCOUNTER — Telehealth: Payer: Self-pay | Admitting: Family Medicine

## 2022-09-10 DIAGNOSIS — Z8673 Personal history of transient ischemic attack (TIA), and cerebral infarction without residual deficits: Secondary | ICD-10-CM | POA: Diagnosis not present

## 2022-09-10 DIAGNOSIS — R69 Illness, unspecified: Secondary | ICD-10-CM | POA: Diagnosis not present

## 2022-09-10 DIAGNOSIS — R339 Retention of urine, unspecified: Secondary | ICD-10-CM | POA: Diagnosis not present

## 2022-09-10 DIAGNOSIS — I10 Essential (primary) hypertension: Secondary | ICD-10-CM | POA: Diagnosis not present

## 2022-09-10 DIAGNOSIS — N3001 Acute cystitis with hematuria: Secondary | ICD-10-CM | POA: Diagnosis not present

## 2022-09-10 DIAGNOSIS — Z466 Encounter for fitting and adjustment of urinary device: Secondary | ICD-10-CM | POA: Diagnosis not present

## 2022-09-10 DIAGNOSIS — N320 Bladder-neck obstruction: Secondary | ICD-10-CM | POA: Diagnosis not present

## 2022-09-10 DIAGNOSIS — N401 Enlarged prostate with lower urinary tract symptoms: Secondary | ICD-10-CM | POA: Diagnosis not present

## 2022-09-10 DIAGNOSIS — Z7982 Long term (current) use of aspirin: Secondary | ICD-10-CM | POA: Diagnosis not present

## 2022-09-10 DIAGNOSIS — E538 Deficiency of other specified B group vitamins: Secondary | ICD-10-CM | POA: Diagnosis not present

## 2022-09-10 DIAGNOSIS — B961 Klebsiella pneumoniae [K. pneumoniae] as the cause of diseases classified elsewhere: Secondary | ICD-10-CM | POA: Diagnosis not present

## 2022-09-10 NOTE — Telephone Encounter (Signed)
Please okay these orders  ?

## 2022-09-10 NOTE — Telephone Encounter (Addendum)
Calvin [name] with [facility] to leave a detailed message on this line   Verbal Order - Nursing  Would like to add an extra Nursing Visit for this week  Would like to re-certify Pt for next period

## 2022-09-10 NOTE — Telephone Encounter (Signed)
Went to change foley at 3am because it was clogged. Registered BP at 170/100.

## 2022-09-10 NOTE — Telephone Encounter (Signed)
I called the cell number and spoke with the patient's daughter.  She advised I call Verdis Frederickson, the caregiver at [phone] and I left a message for her to return my call as the patient needs an appt.

## 2022-09-10 NOTE — Telephone Encounter (Signed)
Make an OV for him to come see

## 2022-09-10 NOTE — Telephone Encounter (Signed)
Unable to leave a message at the patient's cell number due to voicemail being busy-message left at the home number for the patient to return my call.

## 2022-09-10 NOTE — Telephone Encounter (Signed)
Spoke with Sharyn Lull with [facility] advised that VO requested have been approved by Dr Sarajane Jews, verbalized understanding

## 2022-09-10 NOTE — Telephone Encounter (Signed)
Called pt daughter regarding pt, stated that I call pt caregiver to schedule pt appointment for elevated BP per Dr Sarajane Jews. Left a voice message advising to call the office for appointment.

## 2022-09-11 NOTE — Telephone Encounter (Signed)
Left a message for the patient's caregiver to return my call.

## 2022-09-12 ENCOUNTER — Telehealth: Payer: Self-pay | Admitting: Family Medicine

## 2022-09-12 DIAGNOSIS — R339 Retention of urine, unspecified: Secondary | ICD-10-CM | POA: Diagnosis not present

## 2022-09-12 DIAGNOSIS — Z7982 Long term (current) use of aspirin: Secondary | ICD-10-CM | POA: Diagnosis not present

## 2022-09-12 DIAGNOSIS — Z466 Encounter for fitting and adjustment of urinary device: Secondary | ICD-10-CM | POA: Diagnosis not present

## 2022-09-12 DIAGNOSIS — N3001 Acute cystitis with hematuria: Secondary | ICD-10-CM | POA: Diagnosis not present

## 2022-09-12 DIAGNOSIS — I10 Essential (primary) hypertension: Secondary | ICD-10-CM | POA: Diagnosis not present

## 2022-09-12 DIAGNOSIS — E538 Deficiency of other specified B group vitamins: Secondary | ICD-10-CM | POA: Diagnosis not present

## 2022-09-12 DIAGNOSIS — R69 Illness, unspecified: Secondary | ICD-10-CM | POA: Diagnosis not present

## 2022-09-12 DIAGNOSIS — N320 Bladder-neck obstruction: Secondary | ICD-10-CM | POA: Diagnosis not present

## 2022-09-12 DIAGNOSIS — Z8673 Personal history of transient ischemic attack (TIA), and cerebral infarction without residual deficits: Secondary | ICD-10-CM | POA: Diagnosis not present

## 2022-09-12 DIAGNOSIS — N401 Enlarged prostate with lower urinary tract symptoms: Secondary | ICD-10-CM | POA: Diagnosis not present

## 2022-09-12 DIAGNOSIS — B961 Klebsiella pneumoniae [K. pneumoniae] as the cause of diseases classified elsewhere: Secondary | ICD-10-CM | POA: Diagnosis not present

## 2022-09-12 NOTE — Telephone Encounter (Signed)
Le ft detailed message for [facility] with approval for VO requested to D/C pt from PT

## 2022-09-12 NOTE — Telephone Encounter (Signed)
Please okay the DC

## 2022-09-12 NOTE — Telephone Encounter (Signed)
Monique from [facility] call and stated pt request to be discharge from Physical Therapy instead of recertifying

## 2022-09-13 ENCOUNTER — Telehealth: Payer: Self-pay | Admitting: Family Medicine

## 2022-09-13 DIAGNOSIS — B961 Klebsiella pneumoniae [K. pneumoniae] as the cause of diseases classified elsewhere: Secondary | ICD-10-CM | POA: Diagnosis not present

## 2022-09-13 DIAGNOSIS — Z8673 Personal history of transient ischemic attack (TIA), and cerebral infarction without residual deficits: Secondary | ICD-10-CM | POA: Diagnosis not present

## 2022-09-13 DIAGNOSIS — I10 Essential (primary) hypertension: Secondary | ICD-10-CM | POA: Diagnosis not present

## 2022-09-13 DIAGNOSIS — Z7982 Long term (current) use of aspirin: Secondary | ICD-10-CM | POA: Diagnosis not present

## 2022-09-13 DIAGNOSIS — N3001 Acute cystitis with hematuria: Secondary | ICD-10-CM | POA: Diagnosis not present

## 2022-09-13 DIAGNOSIS — E538 Deficiency of other specified B group vitamins: Secondary | ICD-10-CM | POA: Diagnosis not present

## 2022-09-13 DIAGNOSIS — R69 Illness, unspecified: Secondary | ICD-10-CM | POA: Diagnosis not present

## 2022-09-13 DIAGNOSIS — Z466 Encounter for fitting and adjustment of urinary device: Secondary | ICD-10-CM | POA: Diagnosis not present

## 2022-09-13 DIAGNOSIS — R339 Retention of urine, unspecified: Secondary | ICD-10-CM | POA: Diagnosis not present

## 2022-09-13 DIAGNOSIS — N401 Enlarged prostate with lower urinary tract symptoms: Secondary | ICD-10-CM | POA: Diagnosis not present

## 2022-09-13 DIAGNOSIS — N320 Bladder-neck obstruction: Secondary | ICD-10-CM | POA: Diagnosis not present

## 2022-09-13 NOTE — Telephone Encounter (Signed)
Spoke with Beckie Busing verbalized understanding that she received VO order as requested

## 2022-09-13 NOTE — Telephone Encounter (Signed)
Last OV with Dr. Hernandez-08/09/22

## 2022-09-13 NOTE — Telephone Encounter (Signed)
Pt's advocate called to ask for a referral to see the oncologist.  Also, they are requesting a referral to the Pulmonologist.  Please call Pt's Daughter Calvin [name].  Please advise.

## 2022-09-14 DIAGNOSIS — I10 Essential (primary) hypertension: Secondary | ICD-10-CM | POA: Diagnosis not present

## 2022-09-14 DIAGNOSIS — Z8673 Personal history of transient ischemic attack (TIA), and cerebral infarction without residual deficits: Secondary | ICD-10-CM | POA: Diagnosis not present

## 2022-09-14 DIAGNOSIS — Z7982 Long term (current) use of aspirin: Secondary | ICD-10-CM | POA: Diagnosis not present

## 2022-09-14 DIAGNOSIS — N401 Enlarged prostate with lower urinary tract symptoms: Secondary | ICD-10-CM | POA: Diagnosis not present

## 2022-09-14 DIAGNOSIS — R339 Retention of urine, unspecified: Secondary | ICD-10-CM | POA: Diagnosis not present

## 2022-09-14 DIAGNOSIS — N3001 Acute cystitis with hematuria: Secondary | ICD-10-CM | POA: Diagnosis not present

## 2022-09-14 DIAGNOSIS — Z466 Encounter for fitting and adjustment of urinary device: Secondary | ICD-10-CM | POA: Diagnosis not present

## 2022-09-14 DIAGNOSIS — E538 Deficiency of other specified B group vitamins: Secondary | ICD-10-CM | POA: Diagnosis not present

## 2022-09-14 DIAGNOSIS — N320 Bladder-neck obstruction: Secondary | ICD-10-CM | POA: Diagnosis not present

## 2022-09-14 DIAGNOSIS — B961 Klebsiella pneumoniae [K. pneumoniae] as the cause of diseases classified elsewhere: Secondary | ICD-10-CM | POA: Diagnosis not present

## 2022-09-14 DIAGNOSIS — R69 Illness, unspecified: Secondary | ICD-10-CM | POA: Diagnosis not present

## 2022-09-17 NOTE — Telephone Encounter (Signed)
What would these referrals be for? I assume SOB for the pulmonologist, but why see a cancer specialist?

## 2022-09-19 NOTE — Telephone Encounter (Signed)
Left detailed message to pt daughter Alben Spittle advised to call the office regarding this message

## 2022-09-20 NOTE — Telephone Encounter (Signed)
Please return  vanessa call

## 2022-09-21 ENCOUNTER — Telehealth: Payer: Self-pay | Admitting: Family Medicine

## 2022-09-21 ENCOUNTER — Other Ambulatory Visit: Payer: Self-pay

## 2022-09-21 DIAGNOSIS — E538 Deficiency of other specified B group vitamins: Secondary | ICD-10-CM | POA: Diagnosis not present

## 2022-09-21 DIAGNOSIS — R338 Other retention of urine: Secondary | ICD-10-CM | POA: Diagnosis not present

## 2022-09-21 DIAGNOSIS — Z8673 Personal history of transient ischemic attack (TIA), and cerebral infarction without residual deficits: Secondary | ICD-10-CM | POA: Diagnosis not present

## 2022-09-21 DIAGNOSIS — I10 Essential (primary) hypertension: Secondary | ICD-10-CM | POA: Diagnosis not present

## 2022-09-21 DIAGNOSIS — N3001 Acute cystitis with hematuria: Secondary | ICD-10-CM | POA: Diagnosis not present

## 2022-09-21 DIAGNOSIS — Z7982 Long term (current) use of aspirin: Secondary | ICD-10-CM | POA: Diagnosis not present

## 2022-09-21 DIAGNOSIS — F418 Other specified anxiety disorders: Secondary | ICD-10-CM | POA: Diagnosis not present

## 2022-09-21 DIAGNOSIS — N320 Bladder-neck obstruction: Secondary | ICD-10-CM | POA: Diagnosis not present

## 2022-09-21 DIAGNOSIS — Z9181 History of falling: Secondary | ICD-10-CM | POA: Diagnosis not present

## 2022-09-21 DIAGNOSIS — B961 Klebsiella pneumoniae [K. pneumoniae] as the cause of diseases classified elsewhere: Secondary | ICD-10-CM | POA: Diagnosis not present

## 2022-09-21 DIAGNOSIS — N401 Enlarged prostate with lower urinary tract symptoms: Secondary | ICD-10-CM | POA: Diagnosis not present

## 2022-09-21 DIAGNOSIS — N390 Urinary tract infection, site not specified: Secondary | ICD-10-CM | POA: Diagnosis not present

## 2022-09-21 DIAGNOSIS — F32A Depression, unspecified: Secondary | ICD-10-CM | POA: Diagnosis not present

## 2022-09-21 DIAGNOSIS — R0602 Shortness of breath: Secondary | ICD-10-CM

## 2022-09-21 DIAGNOSIS — Z466 Encounter for fitting and adjustment of urinary device: Secondary | ICD-10-CM | POA: Diagnosis not present

## 2022-09-21 MED ORDER — CEPHALEXIN 500 MG PO CAPS: 500.0000 mg | ORAL_CAPSULE | Freq: Three times a day (TID) | ORAL | 0 refills | Status: DC

## 2022-09-21 NOTE — Telephone Encounter (Signed)
Spoke with pt daughter advised that Dr Clent Ridges will place referral to Pulmonary for pt SOB, advised to reach out to pt Urology for Oncologist referral for the prostate Cancer, aware that order for UA/ Urine  culture was faxed to [facility] home health and pt new Rx for Keflex was sent to [facility] pharmacy

## 2022-09-21 NOTE — Telephone Encounter (Signed)
Erie Noe called reports that pt urine is greenish in color and his BP is elevated. Please advise

## 2022-09-21 NOTE — Telephone Encounter (Signed)
Correction the callback number for [facility] lpn is [phone]. The fax number is [phone]

## 2022-09-21 NOTE — Telephone Encounter (Signed)
Abbie Ruschak from [facility] called stating they are needing orders placed for pt, in order to bring urine sample to the lab. She stated pt has potential infection. Call back number for questions or clarification: [phone].

## 2022-09-21 NOTE — Telephone Encounter (Deleted)
Phone Number Edit: [phone]

## 2022-09-21 NOTE — Telephone Encounter (Signed)
Spoke with Byrd Hesselbach LPN with [facility], per request order for UA/ Urine Culture and Rx for Keflex was faxed to the fax number provided. Rx for Keflex 500 mg was sent to pt pharmacy.

## 2022-09-21 NOTE — Telephone Encounter (Signed)
FYI: This call has been transferred to Access Nurse. Once the result note has been entered staff can address the message at that time.  Patient called in with the following symptoms: Maria lpn [facility] is calling   Red Word:elevated blood pressure 170/100   Please advise at     [facility] phone number is [phone] Message is routed to Provider Pool and [facility] Triage

## 2022-09-21 NOTE — Telephone Encounter (Signed)
Call back number: [phone]

## 2022-09-21 NOTE — Telephone Encounter (Signed)
Call in Keflex 500 mg TID for 10 days. Also have them get a UA and a urine culture

## 2022-09-24 ENCOUNTER — Encounter: Payer: Self-pay | Admitting: Family Medicine

## 2022-09-26 DIAGNOSIS — S82832S Other fracture of upper and lower end of left fibula, sequela: Secondary | ICD-10-CM | POA: Diagnosis not present

## 2022-09-26 DIAGNOSIS — R2681 Unsteadiness on feet: Secondary | ICD-10-CM | POA: Diagnosis not present

## 2022-09-26 DIAGNOSIS — R296 Repeated falls: Secondary | ICD-10-CM | POA: Diagnosis not present

## 2022-09-26 DIAGNOSIS — R262 Difficulty in walking, not elsewhere classified: Secondary | ICD-10-CM | POA: Diagnosis not present

## 2022-09-26 DIAGNOSIS — Z9181 History of falling: Secondary | ICD-10-CM | POA: Diagnosis not present

## 2022-09-26 DIAGNOSIS — M6281 Muscle weakness (generalized): Secondary | ICD-10-CM | POA: Diagnosis not present

## 2022-09-29 ENCOUNTER — Other Ambulatory Visit: Payer: Self-pay | Admitting: Family Medicine

## 2022-10-02 ENCOUNTER — Encounter: Payer: Self-pay | Admitting: Interventional Cardiology

## 2022-10-02 ENCOUNTER — Ambulatory Visit: Payer: Medicare HMO | Attending: Interventional Cardiology | Admitting: Interventional Cardiology

## 2022-10-02 VITALS — BP 102/70 | HR 99 | Ht 72.0 in | Wt 238.0 lb

## 2022-10-02 DIAGNOSIS — R0602 Shortness of breath: Secondary | ICD-10-CM

## 2022-10-02 DIAGNOSIS — E782 Mixed hyperlipidemia: Secondary | ICD-10-CM | POA: Diagnosis not present

## 2022-10-02 DIAGNOSIS — I712 Thoracic aortic aneurysm, without rupture, unspecified: Secondary | ICD-10-CM

## 2022-10-02 DIAGNOSIS — I491 Atrial premature depolarization: Secondary | ICD-10-CM | POA: Diagnosis not present

## 2022-10-02 DIAGNOSIS — I10 Essential (primary) hypertension: Secondary | ICD-10-CM

## 2022-10-02 NOTE — Patient Instructions (Signed)
Medication Instructions:  Your physician recommends that you continue on your current medications as directed. Please refer to the Current Medication list given to you today.  *If you need a refill on your cardiac medications before your next appointment, please call your pharmacy*   Lab Work: none If you have labs (blood work) drawn today and your tests are completely normal, you will receive your results only by: MyChart Message (if you have MyChart) OR A paper copy in the mail If you have any lab test that is abnormal or we need to change your treatment, we will call you to review the results.   Testing/Procedures: none   Follow-Up: At [facility], you and your health needs are our priority.  As part of our continuing mission to provide you with exceptional heart care, we have created designated Provider Care Teams.  These Care Teams include your primary Cardiologist (physician) and Advanced Practice Providers (APPs -  Physician Assistants and Nurse Practitioners) who all work together to provide you with the care you need, when you need it.  We recommend signing up for the patient portal called "MyChart".  Sign up information is provided on this After Visit Summary.  MyChart is used to connect with patients for Virtual Visits (Telemedicine).  Patients are able to view lab/test results, encounter notes, upcoming appointments, etc.  Non-urgent messages can be sent to your provider as well.   To learn more about what you can do with MyChart, go to [email or web address].    Your next appointment:   As needed  Provider:   Jayadeep Varanasi, MD     Other Instructions    

## 2022-10-02 NOTE — Progress Notes (Signed)
Cardiology Office Note   Date:  10/02/2022   ID:  Calvin [name], DOB Aug 11, 1945, [name] [id number]  PCP:  [name], MD    No chief complaint on file.  DOE  [employer] Readings from Last 3 Encounters:  10/02/22 238 lb (108 kg)  08/09/22 225 lb 8.5 oz (102.3 kg)  08/09/22 225 lb 8 oz (102.3 kg)       History of Present Illness: Calvin [name] is a 77 y.o. male who is being seen today for the evaluation of DOE at the request of [name], MD.   12/23 echo showed: "Left ventricular ejection fraction, by estimation, is 60 to 65%. The  left ventricle has normal function. The left ventricle has no regional  wall motion abnormalities. There is mild concentric left ventricular  hypertrophy. Left ventricular diastolic  function could not be evaluated.   2. Right ventricular systolic function is normal. The right ventricular  size is normal.   3. The mitral valve is grossly normal. No evidence of mitral valve  regurgitation. No evidence of mitral stenosis.   4. The aortic valve was not well visualized. Aortic valve regurgitation  is not visualized. No aortic stenosis is present.   5. Aortic dilatation noted. There is mild dilatation of the ascending  aorta, measuring 41 mm.   6. The inferior vena cava is normal in size with greater than 50%  respiratory variability, suggesting right atrial pressure of 3 mmHg. "  He went to the ER in February 2024 with records showing: "presents with shortness of breath. He says he has not really noticed it but when he was at physical therapy this morning, the physical therapist noted that he was short of breath. He has noted over the last few days that his legs have been swelling and he no longer have shoes that fit. He does not have any fever. No significant cough or congestion. No associated chest pain. He went to his PCP office and there was concern that he was in A-fib with RVR and was sent here for further evaluation. No prior known heart  problems. He was admitted in December of last year for CVA with urinary retention. He has a history of hypertension and hyperlipidemia. "  BNP in February 2024 was normal at 94.6.  Normal troponin at that time. There was a concern for atrial fibrillation but it turned out his ECG showed sinus rhythm with PACs.  2/24 chest CT showed: "There is a small pericardial effusion. Two-vessel patchy calcifications LAD and circumflex coronary arteries."  Does not report chest pain.  He feels very fatigued when he tries to walk.  He has been in rehab and physical therapy in the past.  He felt he did not get much benefit.   Past Medical History:  Diagnosis Date   Benign prostatic hypertrophy    COVID-19 virus infection 02/24/2020   ED (erectile dysfunction)    Headache(784.0)    Hypertension    Hypogonadism male    Low back pain 06/09/2020   Nephrolithiasis    hx of had lithotripsy in 1-10.   NEPHROLITHIASIS, HX OF 10/25/2008   Qualifier: Diagnosis of   By: Clent Ridges MD, Tera Mater      Pyelonephritis 02/25/2022    Past Surgical History:  Procedure Laterality Date   colonoscopy  10/05/2020   per Dr. Russella Dar, adenomatous polyps, repeat in 3 yrs   FOOT SURGERY     LAPAROSCOPIC APPENDECTOMY  03/12/2012   Procedure:  APPENDECTOMY LAPAROSCOPIC;  Surgeon: Shelly Rubenstein, MD;  Location: [facility] OR;  Service: General;  Laterality: N/A;   STAPLE HEMORRHOIDECTOMY  04/14/2010   per Dr. Michaell Cowing    TONSILLECTOMY       Current Outpatient Medications  Medication Sig Dispense Refill   amLODipine (NORVASC) 5 MG tablet TAKE 1 TABLET (5 MG TOTAL) BY MOUTH DAILY. 90 tablet 3   aspirin EC 81 MG tablet Take 1 tablet (81 mg total) by mouth daily. Swallow whole. 30 tablet 12   atorvastatin (LIPITOR) 20 MG tablet Take 20 mg by mouth daily.     cephALEXin (KEFLEX) 500 MG capsule Take 1 capsule (500 mg total) by mouth 2 (two) times daily. 20 capsule 0   cephALEXin (KEFLEX) 500 MG capsule Take 1 capsule (500 mg total) by  mouth 3 (three) times daily. 30 capsule 0   cyanocobalamin (VITAMIN B12) 1000 MCG/ML injection INJECT 1 ML (1,000 MCG TOTAL) INTO THE MUSCLE ONCE A WEEK. 12 mL 1   cyclobenzaprine (FLEXERIL) 10 MG tablet Take 10 mg by mouth 3 (three) times daily as needed for muscle spasms.     finasteride (PROSCAR) 5 MG tablet TAKE 1 TABLET (5 MG TOTAL) BY MOUTH DAILY. 90 tablet 1   folic acid (FOLVITE) 1 MG tablet Take 1 tablet (1 mg total) by mouth daily. 30 tablet 0   furosemide (LASIX) 20 MG tablet Take 1 tablet (20 mg total) by mouth daily. 3 tablet 0   hydrochlorothiazide (HYDRODIURIL) 25 MG tablet Take 25 mg by mouth daily.     KLOR-CON M10 10 MEQ tablet Take 10 mEq by mouth 2 (two) times daily.     loperamide (IMODIUM) 2 MG capsule Take 1 capsule (2 mg total) by mouth every 8 (eight) hours as needed for diarrhea or loose stools. 30 capsule 0   losartan (COZAAR) 100 MG tablet TAKE 1 TABLET BY MOUTH EVERY DAY 90 tablet 3   Multiple Vitamin (MULTIVITAMIN WITH MINERALS) TABS tablet Take 1 tablet by mouth daily. 30 tablet 0   ondansetron (ZOFRAN) 4 MG tablet Take 4 mg by mouth every 8 (eight) hours as needed for nausea or vomiting.     potassium chloride (KLOR-CON) 10 MEQ tablet Take 10 mEq by mouth 2 (two) times daily.     sertraline (ZOLOFT) 100 MG tablet TAKE 1 TABLET BY MOUTH EVERY DAY 90 tablet 0   sulfamethoxazole-trimethoprim (BACTRIM DS) 800-160 MG tablet Take 1 tablet by mouth 2 (two) times daily.     SYRINGE-NEEDLE, DISP, 3 ML (BD ECLIPSE SYRINGE/NEEDLE) 25G X 5/8" 3 ML MISC Use as directed once a week 12 each 0   tamsulosin (FLOMAX) 0.4 MG CAPS capsule TAKE 1 CAPSULE BY MOUTH EVERY DAY 90 capsule 3   thiamine (VITAMIN B-1) 100 MG tablet Take 1 tablet (100 mg total) by mouth daily. 30 tablet 0   No current facility-administered medications for this visit.    Allergies:   Patient has no known allergies.    Social History:  The patient  reports that he has never smoked. He has never used  smokeless tobacco. He reports that he does not currently use alcohol after a past usage of about 14.0 standard drinks of alcohol per week. He reports that he does not use drugs.   Family History:  The patient's family history includes Coronary artery disease in an other family member; Healthy in his child; Heart Problems in his mother; Heart disease in an other family member; Hypertension in an other family  member; Prostate cancer in his father; Stroke in his father and another family member.    ROS:  Please see the history of present illness.   Otherwise, review of systems are positive for DOE, unsteadiness.   All other systems are reviewed and negative.    PHYSICAL EXAM: VS:  BP 102/70   Pulse 99   Ht 6' (1.829 m)   Wt 238 lb (108 kg)   SpO2 97%   BMI 32.28 kg/m  , BMI Body mass index is 32.28 kg/m. GEN: Well nourished, well developed, in no acute distress HEENT: normal Neck: no JVD, carotid bruits, or masses Cardiac: RRR; no murmurs, rubs, or gallops,no edema  Respiratory:  clear to auscultation bilaterally, normal work of breathing GI: soft, nontender, nondistended, + BS MS: no deformity or atrophy Skin: warm and dry, no rash Neuro:  Slow gait with walker Psych: euthymic mood, full affect   EKG:   The ekg ordered today demonstrates NSR, PACs   Recent Labs: 12/25/2021: TSH 3.94 06/21/2022: ALT 18 08/09/2022: B Natriuretic Peptide 94.6; BUN 19; Creatinine, Ser 0.78; Hemoglobin 14.7; Magnesium 1.9; Platelets 338; Potassium 3.7; Sodium 139   Lipid Panel    Component Value Date/Time   CHOL 173 05/28/2022 0250   TRIG 111 05/28/2022 0250   HDL 59 05/28/2022 0250   CHOLHDL 2.9 05/28/2022 0250   VLDL 22 05/28/2022 0250   LDLCALC 92 05/28/2022 0250   LDLCALC 67 01/05/2020 1047   LDLDIRECT 157.1 02/16/2011 0849     Other studies Reviewed: Additional studies/ records that were reviewed today with results demonstrating: echo reviewed.   ASSESSMENT AND PLAN:  Shortness of  breath: No evidence of volume overload by blood test in February 2024.  No obvious volume overload by exam.  He appears quite deconditioned due to his overall weakness and instability.  He walks quite slowly with a walker.  O2 sats did not change with walking while in the office.  Hypertension: Avoid excessive salt.  COntrolled. The current medical regimen is effective;  continue present plan and medications. Hyperlipidemia: Whole food, plant-based diet.  High-fiber diet.  Avoid processed foods. LDL 92.  Continue statin.  PAC: noted on prior ECG.  No AFib noted on any of the prior ECGs reviewed.   Coronary calcification/Aortic atherosclerosis: No symptoms.  Continue statin.  Thoracic aortic aneurysm of small to moderate size 4.2 cm noted on prior CT.  Blood pressure well-controlled.  He is not doing any heavy strenuous work.  Bigger concern is his overall conditioning level.  He is not a candidate for elective aneurysm repair surgery even if it was to enlarge at this time.  Could consider further follow-up imaging depending on his clinical status in a year.   Current medicines are reviewed at length with the patient today.  The patient concerns regarding his medicines were addressed.  The following changes have been made:  No change  Labs/ tests ordered today include:  No orders of the defined types were placed in this encounter.   Recommend 150 minutes/week of aerobic exercise Low fat, low carb, high fiber diet recommended  Disposition:   FU as needed   Signed, Lance Muss, MD  10/02/2022 2:28 PM    [facility] [address] [place], [place], [place]  [address] Phone: [phone]; Fax: [phone]

## 2022-10-08 NOTE — Progress Notes (Unsigned)
Synopsis: Referred for dyspnea by [name], MD  Subjective:   PATIENT ID: Calvin [name] GENDER: male DOB: [birth year], [name]: [id number]  No chief complaint on file.  77yM with history of covid-19, ED, HTN, kidney stones, pulmonary nodules referred for dyspnea  Otherwise pertinent review of systems is negative.  Past Medical History:  Diagnosis Date   Benign prostatic hypertrophy    COVID-19 virus infection 02/24/2020   ED (erectile dysfunction)    Headache(784.0)    Hypertension    Hypogonadism male    Low back pain 06/09/2020   Nephrolithiasis    hx of had lithotripsy in 1-10.   NEPHROLITHIASIS, HX OF 10/25/2008   Qualifier: Diagnosis of   By: Clent Ridges MD, Tera Mater      Pyelonephritis 02/25/2022     Family History  Problem Relation Age of Onset   Heart Problems Mother        Caused by a Virus when she was a teen   Stroke Father    Prostate cancer Father    Hypertension Other    Stroke Other    Heart disease Other        rheumatic    Coronary artery disease Other    Healthy Child    Colon cancer Neg Hx    Colon polyps Neg Hx    Esophageal cancer Neg Hx    Rectal cancer Neg Hx    Stomach cancer Neg Hx      Past Surgical History:  Procedure Laterality Date   colonoscopy  10/05/2020   per Dr. Russella Dar, adenomatous polyps, repeat in 3 yrs   FOOT SURGERY     LAPAROSCOPIC APPENDECTOMY  03/12/2012   Procedure: APPENDECTOMY LAPAROSCOPIC;  Surgeon: Shelly Rubenstein, MD;  Location: [facility] OR;  Service: General;  Laterality: N/A;   STAPLE HEMORRHOIDECTOMY  04/14/2010   per Dr. Michaell Cowing    TONSILLECTOMY      Social History   Socioeconomic History   Marital status: Widowed    Spouse name: Not on file   Number of children: Not on file   Years of education: Not on file   Highest education level: Not on file  Occupational History   Occupation: retired    Comment: owned [employer]  Tobacco Use   Smoking status: Never   Smokeless tobacco: Never  Haematologist Use: Never used  Substance and Sexual Activity   Alcohol use: Not Currently    Alcohol/week: 14.0 standard drinks of alcohol    Types: 14 Standard drinks or equivalent per week    Comment: quit 3 weeks ago   Drug use: No   Sexual activity: Not on file  Other Topics Concern   Not on file  Social History Narrative   Right Handed    Lives in a one story home with a basement.    Social Determinants of Health   Financial Resource Strain: Low Risk  (04/14/2020)   Overall Financial Resource Strain (CARDIA)    Difficulty of Paying Living Expenses: Not hard at all  Food Insecurity: No Food Insecurity (02/25/2022)   Hunger Vital Sign    Worried About Running Out of Food in the Last Year: Never true    Ran Out of Food in the Last Year: Never true  Transportation Needs: No Transportation Needs (02/25/2022)   PRAPARE - Administrator, Civil Service (Medical): No    Lack of Transportation (Non-Medical): No  Physical Activity: Inactive (03/08/2021)  Exercise Vital Sign    Days of Exercise per Week: 0 days    Minutes of Exercise per Session: 0 min  Stress: No Stress Concern Present (04/14/2020)   [employer] of Occupational Health - Occupational Stress Questionnaire    Feeling of Stress : Not at all  Social Connections: Socially Isolated (03/08/2021)   Social Connection and Isolation Panel [NHANES]    Frequency of Communication with Friends and Family: Once a week    Frequency of Social Gatherings with Friends and Family: Once a week    Attends Religious Services: Never    Database administrator or Organizations: No    Attends Banker Meetings: Never    Marital Status: Widowed  Intimate Partner Violence: Unknown (02/25/2022)   Humiliation, Afraid, Rape, and Kick questionnaire    Fear of Current or Ex-Partner: No    Emotionally Abused: No    Physically Abused: Not on file    Sexually Abused: Not on file     No Known Allergies   Outpatient  Medications Prior to Visit  Medication Sig Dispense Refill   amLODipine (NORVASC) 5 MG tablet TAKE 1 TABLET (5 MG TOTAL) BY MOUTH DAILY. 90 tablet 3   aspirin EC 81 MG tablet Take 1 tablet (81 mg total) by mouth daily. Swallow whole. 30 tablet 12   atorvastatin (LIPITOR) 20 MG tablet Take 20 mg by mouth daily.     cephALEXin (KEFLEX) 500 MG capsule Take 1 capsule (500 mg total) by mouth 2 (two) times daily. 20 capsule 0   cephALEXin (KEFLEX) 500 MG capsule Take 1 capsule (500 mg total) by mouth 3 (three) times daily. 30 capsule 0   cyanocobalamin (VITAMIN B12) 1000 MCG/ML injection INJECT 1 ML (1,000 MCG TOTAL) INTO THE MUSCLE ONCE A WEEK. 12 mL 1   cyclobenzaprine (FLEXERIL) 10 MG tablet Take 10 mg by mouth 3 (three) times daily as needed for muscle spasms.     finasteride (PROSCAR) 5 MG tablet TAKE 1 TABLET (5 MG TOTAL) BY MOUTH DAILY. 90 tablet 1   folic acid (FOLVITE) 1 MG tablet Take 1 tablet (1 mg total) by mouth daily. 30 tablet 0   furosemide (LASIX) 20 MG tablet Take 1 tablet (20 mg total) by mouth daily. 3 tablet 0   hydrochlorothiazide (HYDRODIURIL) 25 MG tablet Take 25 mg by mouth daily.     KLOR-CON M10 10 MEQ tablet Take 10 mEq by mouth 2 (two) times daily.     loperamide (IMODIUM) 2 MG capsule Take 1 capsule (2 mg total) by mouth every 8 (eight) hours as needed for diarrhea or loose stools. 30 capsule 0   losartan (COZAAR) 100 MG tablet TAKE 1 TABLET BY MOUTH EVERY DAY 90 tablet 3   Multiple Vitamin (MULTIVITAMIN WITH MINERALS) TABS tablet Take 1 tablet by mouth daily. 30 tablet 0   ondansetron (ZOFRAN) 4 MG tablet Take 4 mg by mouth every 8 (eight) hours as needed for nausea or vomiting.     potassium chloride (KLOR-CON) 10 MEQ tablet Take 10 mEq by mouth 2 (two) times daily.     sertraline (ZOLOFT) 100 MG tablet TAKE 1 TABLET BY MOUTH EVERY DAY 90 tablet 0   sulfamethoxazole-trimethoprim (BACTRIM DS) 800-160 MG tablet Take 1 tablet by mouth 2 (two) times daily.      SYRINGE-NEEDLE, DISP, 3 ML (BD ECLIPSE SYRINGE/NEEDLE) 25G X 5/8" 3 ML MISC Use as directed once a week 12 each 0   tamsulosin (FLOMAX) 0.4 MG CAPS  capsule TAKE 1 CAPSULE BY MOUTH EVERY DAY 90 capsule 3   thiamine (VITAMIN B-1) 100 MG tablet Take 1 tablet (100 mg total) by mouth daily. 30 tablet 0   No facility-administered medications prior to visit.       Objective:   Physical Exam:  General appearance: 77 y.o., male, NAD, conversant  Eyes: anicteric sclerae; PERRL, tracking appropriately HENT: NCAT; MMM Neck: Trachea midline; no lymphadenopathy, no JVD Lungs: CTAB, no crackles, no wheeze, with normal respiratory effort CV: RRR, no murmur  Abdomen: Soft, non-tender; non-distended, BS present  Extremities: No peripheral edema, warm Skin: Normal turgor and texture; no rash Psych: Appropriate affect Neuro: Alert and oriented to person and place, no focal deficit     There were no vitals filed for this visit.   on *** LPM *** RA BMI Readings from Last 3 Encounters:  10/02/22 32.28 kg/m  08/09/22 30.59 kg/m  08/09/22 30.58 kg/m   Wt Readings from Last 3 Encounters:  10/02/22 238 lb (108 kg)  08/09/22 225 lb 8.5 oz (102.3 kg)  08/09/22 225 lb 8 oz (102.3 kg)     CBC    Component Value Date/Time   WBC 10.5 08/09/2022 1547   RBC 4.65 08/09/2022 1547   HGB 14.7 08/09/2022 1547   HCT 43.1 08/09/2022 1547   PLT 338 08/09/2022 1547   MCV 92.7 08/09/2022 1547   MCH 31.6 08/09/2022 1547   MCHC 34.1 08/09/2022 1547   RDW 14.3 08/09/2022 1547   LYMPHSABS 1.2 06/21/2022 1010   MONOABS 1.3 (H) 06/21/2022 1010   EOSABS 0.0 06/21/2022 1010   BASOSABS 0.0 06/21/2022 1010    ***  Chest Imaging: CTA chest 08/09/22: chronic R hemidiaphragm elevation, calcified granulomas and scarring RML base, stable RLL 7mm nodule relative to 2017 CT AP, cardiomegaly small PCE  Pulmonary Functions Testing Results:     No data to display          FeNO: ***  Pathology:  ***  Echocardiogram 05/28/22:   1. Left ventricular ejection fraction, by estimation, is 60 to 65%. The  left ventricle has normal function. The left ventricle has no regional  wall motion abnormalities. There is mild concentric left ventricular  hypertrophy. Left ventricular diastolic  function could not be evaluated.   2. Right ventricular systolic function is normal. The right ventricular  size is normal.   3. The mitral valve is grossly normal. No evidence of mitral valve  regurgitation. No evidence of mitral stenosis.   4. The aortic valve was not well visualized. Aortic valve regurgitation  is not visualized. No aortic stenosis is present.   5. Aortic dilatation noted. There is mild dilatation of the ascending  aorta, measuring 41 mm.   6. The inferior vena cava is normal in size with greater than 50%  respiratory variability, suggesting right atrial pressure of 3 mmHg.   Heart Catheterization: ***    Assessment & Plan:    Plan:      Omar Person, MD  Pulmonary Critical Care 10/08/2022 5:05 PM

## 2022-10-09 ENCOUNTER — Other Ambulatory Visit: Payer: Self-pay

## 2022-10-09 MED ORDER — NITROFURANTOIN MONOHYD MACRO 100 MG PO CAPS: 100.0000 mg | ORAL_CAPSULE | Freq: Two times a day (BID) | ORAL | 0 refills | Status: DC

## 2022-10-10 ENCOUNTER — Encounter: Payer: Self-pay | Admitting: Student

## 2022-10-10 ENCOUNTER — Ambulatory Visit: Payer: Medicare HMO | Admitting: Student

## 2022-10-10 VITALS — BP 126/76 | HR 63 | Temp 98.2°F | Ht 72.0 in | Wt 238.0 lb

## 2022-10-10 DIAGNOSIS — R0609 Other forms of dyspnea: Secondary | ICD-10-CM | POA: Diagnosis not present

## 2022-10-10 DIAGNOSIS — R053 Chronic cough: Secondary | ICD-10-CM | POA: Diagnosis not present

## 2022-10-10 DIAGNOSIS — G4719 Other hypersomnia: Secondary | ICD-10-CM

## 2022-10-10 MED ORDER — FLUTICASONE PROPIONATE 50 MCG/ACT NA SUSP: 1.0000 | Freq: Every day | NASAL | 11 refills | Status: AC

## 2022-10-10 NOTE — Patient Instructions (Addendum)
-   home sleep study - PFTs in 3 months on same day as next clinic visit

## 2022-10-11 DIAGNOSIS — E538 Deficiency of other specified B group vitamins: Secondary | ICD-10-CM | POA: Diagnosis not present

## 2022-10-11 DIAGNOSIS — R338 Other retention of urine: Secondary | ICD-10-CM | POA: Diagnosis not present

## 2022-10-11 DIAGNOSIS — F32A Depression, unspecified: Secondary | ICD-10-CM | POA: Diagnosis not present

## 2022-10-11 DIAGNOSIS — Z8673 Personal history of transient ischemic attack (TIA), and cerebral infarction without residual deficits: Secondary | ICD-10-CM | POA: Diagnosis not present

## 2022-10-11 DIAGNOSIS — N320 Bladder-neck obstruction: Secondary | ICD-10-CM | POA: Diagnosis not present

## 2022-10-11 DIAGNOSIS — Z9181 History of falling: Secondary | ICD-10-CM | POA: Diagnosis not present

## 2022-10-11 DIAGNOSIS — N401 Enlarged prostate with lower urinary tract symptoms: Secondary | ICD-10-CM | POA: Diagnosis not present

## 2022-10-11 DIAGNOSIS — B961 Klebsiella pneumoniae [K. pneumoniae] as the cause of diseases classified elsewhere: Secondary | ICD-10-CM | POA: Diagnosis not present

## 2022-10-11 DIAGNOSIS — I10 Essential (primary) hypertension: Secondary | ICD-10-CM | POA: Diagnosis not present

## 2022-10-11 DIAGNOSIS — N3001 Acute cystitis with hematuria: Secondary | ICD-10-CM | POA: Diagnosis not present

## 2022-10-11 DIAGNOSIS — F418 Other specified anxiety disorders: Secondary | ICD-10-CM | POA: Diagnosis not present

## 2022-10-11 DIAGNOSIS — Z7982 Long term (current) use of aspirin: Secondary | ICD-10-CM | POA: Diagnosis not present

## 2022-10-11 DIAGNOSIS — Z466 Encounter for fitting and adjustment of urinary device: Secondary | ICD-10-CM | POA: Diagnosis not present

## 2022-10-12 DIAGNOSIS — Z7982 Long term (current) use of aspirin: Secondary | ICD-10-CM | POA: Diagnosis not present

## 2022-10-12 DIAGNOSIS — B961 Klebsiella pneumoniae [K. pneumoniae] as the cause of diseases classified elsewhere: Secondary | ICD-10-CM | POA: Diagnosis not present

## 2022-10-12 DIAGNOSIS — Z9181 History of falling: Secondary | ICD-10-CM

## 2022-10-12 DIAGNOSIS — Z466 Encounter for fitting and adjustment of urinary device: Secondary | ICD-10-CM | POA: Diagnosis not present

## 2022-10-12 DIAGNOSIS — E538 Deficiency of other specified B group vitamins: Secondary | ICD-10-CM | POA: Diagnosis not present

## 2022-10-12 DIAGNOSIS — N401 Enlarged prostate with lower urinary tract symptoms: Secondary | ICD-10-CM | POA: Diagnosis not present

## 2022-10-12 DIAGNOSIS — N3001 Acute cystitis with hematuria: Secondary | ICD-10-CM | POA: Diagnosis not present

## 2022-10-12 DIAGNOSIS — Z8673 Personal history of transient ischemic attack (TIA), and cerebral infarction without residual deficits: Secondary | ICD-10-CM | POA: Diagnosis not present

## 2022-10-12 DIAGNOSIS — R338 Other retention of urine: Secondary | ICD-10-CM | POA: Diagnosis not present

## 2022-10-12 DIAGNOSIS — F418 Other specified anxiety disorders: Secondary | ICD-10-CM | POA: Diagnosis not present

## 2022-10-12 DIAGNOSIS — F32A Depression, unspecified: Secondary | ICD-10-CM | POA: Diagnosis not present

## 2022-10-12 DIAGNOSIS — N320 Bladder-neck obstruction: Secondary | ICD-10-CM | POA: Diagnosis not present

## 2022-10-12 DIAGNOSIS — I10 Essential (primary) hypertension: Secondary | ICD-10-CM | POA: Diagnosis not present

## 2022-10-22 DIAGNOSIS — N401 Enlarged prostate with lower urinary tract symptoms: Secondary | ICD-10-CM | POA: Diagnosis not present

## 2022-10-26 DIAGNOSIS — R296 Repeated falls: Secondary | ICD-10-CM | POA: Diagnosis not present

## 2022-10-26 DIAGNOSIS — Z9181 History of falling: Secondary | ICD-10-CM | POA: Diagnosis not present

## 2022-10-26 DIAGNOSIS — S82832S Other fracture of upper and lower end of left fibula, sequela: Secondary | ICD-10-CM | POA: Diagnosis not present

## 2022-10-26 DIAGNOSIS — M6281 Muscle weakness (generalized): Secondary | ICD-10-CM | POA: Diagnosis not present

## 2022-10-26 DIAGNOSIS — R2681 Unsteadiness on feet: Secondary | ICD-10-CM | POA: Diagnosis not present

## 2022-10-26 DIAGNOSIS — R262 Difficulty in walking, not elsewhere classified: Secondary | ICD-10-CM | POA: Diagnosis not present

## 2022-10-28 ENCOUNTER — Other Ambulatory Visit: Payer: Self-pay

## 2022-10-28 ENCOUNTER — Observation Stay (HOSPITAL_COMMUNITY)
Admission: EM | Admit: 2022-10-28 | Discharge: 2022-10-29 | Disposition: A | Discharge status: Home / Self Care | Payer: Medicare HMO | Attending: Internal Medicine | Admitting: Internal Medicine

## 2022-10-28 ENCOUNTER — Emergency Department (HOSPITAL_COMMUNITY): Payer: Medicare HMO

## 2022-10-28 DIAGNOSIS — Z8616 Personal history of COVID-19: Secondary | ICD-10-CM | POA: Insufficient documentation

## 2022-10-28 DIAGNOSIS — N320 Bladder-neck obstruction: Secondary | ICD-10-CM | POA: Diagnosis present

## 2022-10-28 DIAGNOSIS — Z8673 Personal history of transient ischemic attack (TIA), and cerebral infarction without residual deficits: Secondary | ICD-10-CM | POA: Diagnosis present

## 2022-10-28 DIAGNOSIS — I160 Hypertensive urgency: Secondary | ICD-10-CM | POA: Diagnosis not present

## 2022-10-28 DIAGNOSIS — R310 Gross hematuria: Principal | ICD-10-CM

## 2022-10-28 DIAGNOSIS — F32A Depression, unspecified: Secondary | ICD-10-CM | POA: Diagnosis not present

## 2022-10-28 DIAGNOSIS — Z79899 Other long term (current) drug therapy: Secondary | ICD-10-CM | POA: Diagnosis not present

## 2022-10-28 DIAGNOSIS — I10 Essential (primary) hypertension: Secondary | ICD-10-CM | POA: Diagnosis not present

## 2022-10-28 DIAGNOSIS — R319 Hematuria, unspecified: Secondary | ICD-10-CM | POA: Diagnosis not present

## 2022-10-28 DIAGNOSIS — I639 Cerebral infarction, unspecified: Secondary | ICD-10-CM | POA: Diagnosis present

## 2022-10-28 DIAGNOSIS — Z7982 Long term (current) use of aspirin: Secondary | ICD-10-CM | POA: Insufficient documentation

## 2022-10-28 DIAGNOSIS — E782 Mixed hyperlipidemia: Secondary | ICD-10-CM | POA: Diagnosis not present

## 2022-10-28 LAB — COMPREHENSIVE METABOLIC PANEL
ALT: 29 U/L (ref 0–44)
AST: 25 U/L (ref 15–41)
Albumin: 4 g/dL (ref 3.5–5.0)
Alkaline Phosphatase: 77 U/L (ref 38–126)
Anion gap: 10 (ref 5–15)
BUN: 18 mg/dL (ref 8–23)
CO2: 22 mmol/L (ref 22–32)
Calcium: 9.1 mg/dL (ref 8.9–10.3)
Chloride: 101 mmol/L (ref 98–111)
Creatinine, Ser: 0.75 mg/dL (ref 0.61–1.24)
GFR, Estimated: >60 mL/min (ref >60)
Glucose, Bld: 117 mg/dL — ABNORMAL HIGH (ref 70–99)
Potassium: 3.9 mmol/L (ref 3.5–5.1)
Sodium: 133 mmol/L — ABNORMAL LOW (ref 135–145)
Total Bilirubin: 0.9 mg/dL (ref 0.3–1.2)
Total Protein: 7.3 g/dL (ref 6.5–8.1)

## 2022-10-28 LAB — CBC
HCT: 43.3 % (ref 39.0–52.0)
Hemoglobin: 15.4 g/dL (ref 13.0–17.0)
MCH: 34.3 pg — ABNORMAL HIGH (ref 26.0–34.0)
MCHC: 35.6 g/dL (ref 30.0–36.0)
MCV: 96.4 fL (ref 80.0–100.0)
Platelets: 253 10*3/uL (ref 150–400)
RBC: 4.49 MIL/uL (ref 4.22–5.81)
RDW: 13.2 % (ref 11.5–15.5)
WBC: 9.8 10*3/uL (ref 4.0–10.5)
nRBC: 0 % (ref 0.0–0.2)

## 2022-10-28 LAB — URINALYSIS, W/ REFLEX TO CULTURE (INFECTION SUSPECTED)
Glucose, UA: 100 mg/dL — AB
Ketones, ur: 15 mg/dL — AB
Nitrite: POSITIVE — AB
Protein, ur: >300 mg/dL — AB
RBC / HPF: >50 RBC/hpf (ref 0–5)
Specific Gravity, Urine: 1.01 (ref 1.005–1.030)
Squamous Epithelial / HPF: NONE SEEN /HPF (ref 0–5)
WBC, UA: NONE SEEN WBC/hpf (ref 0–5)
pH: 8.5 — ABNORMAL HIGH (ref 5.0–8.0)

## 2022-10-28 LAB — HEMOGLOBIN AND HEMATOCRIT, BLOOD
HCT: 42.8 % (ref 39.0–52.0)
Hemoglobin: 15 g/dL (ref 13.0–17.0)

## 2022-10-28 LAB — ABO/RH: ABO/RH(D): O POS

## 2022-10-28 LAB — TYPE AND SCREEN
ABO/RH(D): O POS
Antibody Screen: NEGATIVE

## 2022-10-28 MED ORDER — ATORVASTATIN CALCIUM 20 MG PO TABS
20.0000 mg | ORAL_TABLET | Freq: Every day | ORAL | Status: DC
  Administered 2022-10-29: 20 mg via ORAL
  Filled 2022-10-28: qty 1

## 2022-10-28 MED ORDER — ACETAMINOPHEN 650 MG RE SUPP: 650.0000 mg | Freq: Four times a day (QID) | RECTAL | Status: DC | PRN

## 2022-10-28 MED ORDER — ONDANSETRON HCL 4 MG/2ML IJ SOLN: 4.0000 mg | Freq: Four times a day (QID) | INTRAMUSCULAR | Status: DC | PRN

## 2022-10-28 MED ORDER — AMLODIPINE BESYLATE 5 MG PO TABS
5.0000 mg | ORAL_TABLET | Freq: Once | ORAL | Status: AC
  Administered 2022-10-28: 5 mg via ORAL
  Filled 2022-10-28: qty 1

## 2022-10-28 MED ORDER — ONDANSETRON HCL 4 MG PO TABS: 4.0000 mg | ORAL_TABLET | Freq: Four times a day (QID) | ORAL | Status: DC | PRN

## 2022-10-28 MED ORDER — AMLODIPINE BESYLATE 5 MG PO TABS
5.0000 mg | ORAL_TABLET | Freq: Every day | ORAL | Status: DC
  Administered 2022-10-29: 5 mg via ORAL
  Filled 2022-10-28: qty 1

## 2022-10-28 MED ORDER — HYDROCHLOROTHIAZIDE 25 MG PO TABS
25.0000 mg | ORAL_TABLET | Freq: Once | ORAL | Status: AC
  Administered 2022-10-28: 25 mg via ORAL
  Filled 2022-10-28: qty 1

## 2022-10-28 MED ORDER — FINASTERIDE 5 MG PO TABS
5.0000 mg | ORAL_TABLET | Freq: Every day | ORAL | Status: DC
  Administered 2022-10-29: 5 mg via ORAL
  Filled 2022-10-28: qty 1

## 2022-10-28 MED ORDER — CEFTRIAXONE SODIUM 1 G IJ SOLR
1.0000 g | Freq: Once | INTRAMUSCULAR | Status: AC
  Administered 2022-10-28: 1 g via INTRAMUSCULAR
  Filled 2022-10-28: qty 10

## 2022-10-28 MED ORDER — CHLORHEXIDINE GLUCONATE CLOTH 2 % EX PADS
6.0000 | MEDICATED_PAD | Freq: Every day | CUTANEOUS | Status: DC
  Administered 2022-10-29: 6 via TOPICAL

## 2022-10-28 MED ORDER — ACETAMINOPHEN 325 MG PO TABS: 650.0000 mg | ORAL_TABLET | Freq: Four times a day (QID) | ORAL | Status: DC | PRN

## 2022-10-28 MED ORDER — LIDOCAINE HCL (PF) 1 % IJ SOLN
2.1000 mL | Freq: Once | INTRAMUSCULAR | Status: AC
  Administered 2022-10-28: 2.1 mL
  Filled 2022-10-28: qty 30

## 2022-10-28 MED ORDER — SODIUM CHLORIDE 0.9 % IR SOLN
3000.0000 mL | Status: DC
  Administered 2022-10-28 – 2022-10-29 (×4): 3000 mL

## 2022-10-28 MED ORDER — LOSARTAN POTASSIUM 50 MG PO TABS
100.0000 mg | ORAL_TABLET | Freq: Every day | ORAL | Status: DC
  Administered 2022-10-28 – 2022-10-29 (×2): 100 mg via ORAL
  Filled 2022-10-28: qty 2
  Filled 2022-10-28: qty 4

## 2022-10-28 MED ORDER — SERTRALINE HCL 100 MG PO TABS
100.0000 mg | ORAL_TABLET | Freq: Every day | ORAL | Status: DC
  Administered 2022-10-29: 100 mg via ORAL
  Filled 2022-10-28: qty 1

## 2022-10-28 MED ORDER — SODIUM CHLORIDE 0.9 % IV SOLN
1.0000 g | INTRAVENOUS | Status: DC
  Filled 2022-10-28: qty 10

## 2022-10-28 MED ORDER — ADULT MULTIVITAMIN W/MINERALS CH
1.0000 | ORAL_TABLET | Freq: Every day | ORAL | Status: DC
  Administered 2022-10-29: 1 via ORAL
  Filled 2022-10-28: qty 1

## 2022-10-28 MED ORDER — TAMSULOSIN HCL 0.4 MG PO CAPS
0.4000 mg | ORAL_CAPSULE | Freq: Every day | ORAL | Status: DC
  Administered 2022-10-29: 0.4 mg via ORAL
  Filled 2022-10-28: qty 1

## 2022-10-28 MED ORDER — ASPIRIN 81 MG PO TBEC
81.0000 mg | DELAYED_RELEASE_TABLET | Freq: Every day | ORAL | Status: DC
  Administered 2022-10-29: 81 mg via ORAL
  Filled 2022-10-28: qty 1

## 2022-10-28 NOTE — ED Provider Notes (Signed)
[place] EMERGENCY DEPARTMENT AT [facility] Provider Note   CSN: [id number] Arrival date & time: 10/28/22  1250     History  Chief Complaint  Patient presents with   Hematuria    Calvin [name] is a 77 y.o. male with a PMHx of enlarged prostate, who presents emergency department with concerns for hematuria onset yesterday afternoon.  Patient has a Foley catheter in place that was last changed out several weeks ago due to it not working.  Denies abdominal pain, nausea, vomiting, dysuria.  Patient has a 16 [place] Foley catheter in place.   The history is provided by the patient and a relative. No language interpreter was used.       Home Medications Prior to Admission medications   Medication Sig Start Date End Date Taking? Authorizing Provider  amLODipine (NORVASC) 5 MG tablet TAKE 1 TABLET (5 MG TOTAL) BY MOUTH DAILY. 05/03/22   [name], MD  aspirin EC 81 MG tablet Take 1 tablet (81 mg total) by mouth daily. Swallow whole. 06/01/22   Marguerita Merles Latif, DO  atorvastatin (LIPITOR) 20 MG tablet Take 20 mg by mouth daily. 07/30/22   [provider]  cyanocobalamin (VITAMIN B12) 1000 MCG/ML injection INJECT 1 ML (1,000 MCG TOTAL) INTO THE MUSCLE ONCE A WEEK. 03/13/22   [name], MD  cyclobenzaprine (FLEXERIL) 10 MG tablet Take 10 mg by mouth 3 (three) times daily as needed for muscle spasms. 03/04/22   [provider]  finasteride (PROSCAR) 5 MG tablet TAKE 1 TABLET (5 MG TOTAL) BY MOUTH DAILY. 10/01/22   [name], MD  fluticasone (FLONASE) 50 MCG/ACT nasal spray Place 1 spray into both nostrils daily. 10/10/22   Omar Person, MD  folic acid (FOLVITE) 1 MG tablet Take 1 tablet (1 mg total) by mouth daily. 06/01/22   Marguerita Merles Latif, DO  furosemide (LASIX) 20 MG tablet Take 1 tablet (20 mg total) by mouth daily. 08/09/22   Rolan Bucco, MD  hydrochlorothiazide (HYDRODIURIL) 25 MG tablet Take 25 mg by mouth daily. 07/16/22    [provider]  KLOR-CON M10 10 MEQ tablet Take 10 mEq by mouth 2 (two) times daily. 03/22/22   [provider]  loperamide (IMODIUM) 2 MG capsule Take 1 capsule (2 mg total) by mouth every 8 (eight) hours as needed for diarrhea or loose stools. 03/21/22   Glade Lloyd, MD  losartan (COZAAR) 100 MG tablet TAKE 1 TABLET BY MOUTH EVERY DAY 05/03/22   [name], MD  Multiple Vitamin (MULTIVITAMIN WITH MINERALS) TABS tablet Take 1 tablet by mouth daily. 06/01/22   Sheikh, Kateri Mc Latif, DO  nitrofurantoin, macrocrystal-monohydrate, (MACROBID) 100 MG capsule Take 1 capsule (100 mg total) by mouth 2 (two) times daily. 10/09/22   [name], MD  ondansetron (ZOFRAN) 4 MG tablet Take 4 mg by mouth every 8 (eight) hours as needed for nausea or vomiting.    [provider]  potassium chloride (KLOR-CON) 10 MEQ tablet Take 10 mEq by mouth 2 (two) times daily. 07/16/22   [provider]  sertraline (ZOLOFT) 100 MG tablet TAKE 1 TABLET BY MOUTH EVERY DAY 10/01/22   [name], MD  SYRINGE-NEEDLE, DISP, 3 ML (BD ECLIPSE SYRINGE/NEEDLE) 25G X 5/8" 3 ML MISC Use as directed once a week 12/26/21   [name], MD  tamsulosin (FLOMAX) 0.4 MG CAPS capsule TAKE 1 CAPSULE BY MOUTH EVERY DAY 05/03/22   [name], MD  thiamine (VITAMIN B-1) 100 MG tablet Take 1 tablet (100 mg total) by mouth daily. 06/01/22   Merlene Laughter, DO      Allergies    Patient has no known allergies.    Review of Systems   Review of Systems  Constitutional:  Negative for fever.  Gastrointestinal:  Negative for abdominal pain, nausea and vomiting.  Genitourinary:  Positive for hematuria. Negative for dysuria.  All other systems reviewed and are negative.   Physical Exam Updated Vital Signs BP (!) 150/90   Pulse 95   Temp 98.5 F (36.9 C) (Oral)   Resp 18   Ht 6' (1.829 m)   Wt 99.8 kg   SpO2 94%   BMI 29.84 kg/m  Physical Exam Vitals and nursing note reviewed. Exam  conducted with a chaperone present.  Constitutional:      General: He is not in acute distress.    Appearance: He is not diaphoretic.  HENT:     Head: Normocephalic and atraumatic.     Mouth/Throat:     Pharynx: No oropharyngeal exudate.  Eyes:     General: No scleral icterus.    Conjunctiva/sclera: Conjunctivae normal.  Cardiovascular:     Rate and Rhythm: Normal rate and regular rhythm.     Pulses: Normal pulses.     Heart sounds: Normal heart sounds.  Pulmonary:     Effort: Pulmonary effort is normal. No respiratory distress.     Breath sounds: Normal breath sounds. No wheezing.  Abdominal:     General: Bowel sounds are normal.     Palpations: Abdomen is soft. There is no mass.     Tenderness: There is no abdominal tenderness. There is no guarding or rebound.  Genitourinary:    Comments: RN chaperone Education officer, museum) present for GU exam.  Indwelling Foley catheter in place.  No appreciable drainage or blood noted at the catheter site. Musculoskeletal:        General: Normal range of motion.     Cervical back: Normal range of motion and neck supple.  Skin:    General: Skin is warm and dry.  Neurological:     Mental Status: He is alert.  Psychiatric:        Behavior: Behavior normal.     ED Results / Procedures / Treatments   Labs (all labs ordered are listed, but only abnormal results are displayed) Labs Reviewed  CBC - Abnormal; Notable for the following components:      Result Value   MCH 34.3 (*)    All other components within normal limits  COMPREHENSIVE METABOLIC PANEL - Abnormal; Notable for the following components:   Sodium 133 (*)    Glucose, Bld 117 (*)    All other components within normal limits  URINALYSIS, W/ REFLEX TO CULTURE (INFECTION SUSPECTED)  TYPE AND SCREEN    EKG None  Radiology No results found.  Procedures Procedures    Medications Ordered in ED Medications - No data to display  ED Course/ Medical Decision Making/  A&P Clinical Course as of 10/28/22 1742  Sun Oct 28, 2022  1521 Consult with Urology Resident, Dr. Delanna Ahmadi who is agreeable with CT Cystogram at this time.  [SB]  1704 Dr. Delanna Ahmadi (Urology) requesting Urology cath cart to be brought to patient room from OR and he will see the patient at bedside.  [SB]  1739 Pt declines CT at this time. Call placed to Urology, Dr. Delanna Ahmadi who recommends bladder scan and will evaluate the  patient in the ED. [SB]    Clinical Course User Index [SB] Holdan Stucke A, PA-C                             Medical Decision Making Amount and/or Complexity of Data Reviewed Labs: ordered. Radiology: ordered.  Risk Prescription drug management.   Pt presents with hematuria onset last night. Vital signs, patient afebrile. On exam, pt with Foley catheter in place.  No appreciable bleeding noted to the urethral meatus. No acute cardiovascular, respiratory, abdominal exam findings. Differential diagnosis includes UTI, foley catheter problem.    Co morbidities that complicate the patient evaluation: Hypertension, enlarged prostate, bladder outflow obstruction   Labs:  I ordered, and personally interpreted labs.  The pertinent results include:   -Screen obtained Urinalysis with concerns for acute cystitis CBC and CMP otherwise unremarkable   Medications:  I ordered medication including rocephin for UTI treatment I have reviewed the patients home medicines and have made adjustments as needed   Consultations: I requested consultation with the Urologist, and discussed lab and imaging findings as well as pertinent plan - they recommend: Agreeable with CT cystogram at this time.  Recommendations of flushing Foley catheter and will be available for consult after CT cystogram obtained - consultation with Urologist who recommends bladder scan and will come evaluate the patient  Patient case discussed with Achille Rich, PA-C at sign-out. Plan at sign-out is pending  urology consult, disposition as per oncoming team and urology recommendations, however, plans may change as per oncoming team. Patient care transferred at sign out.     This chart was dictated using voice recognition software, Dragon. Despite the best efforts of this provider to proofread and correct errors, errors may still occur which can change documentation meaning.   Final Clinical Impression(s) / ED Diagnoses Final diagnoses:  Hematuria, unspecified type    Rx / DC Orders ED Discharge Orders     None         Zeric Baranowski A, PA-C 10/28/22 1802    Kommor, Wyn Forster, MD 10/29/22 1332

## 2022-10-28 NOTE — ED Triage Notes (Signed)
Pt arrived via POV. Pt reports hematuria beginning yesterday afternoon. Pt has foley catheter. Leg bag is full of dark red urine.  C/o fatigue.  AOx4

## 2022-10-28 NOTE — Consult Note (Signed)
Urology Consult   Physician requesting consult: Glendora Score, MD  Reason for consult: hematuria  History of Present Illness: GUSTAVUS BASALDUA is a 77 y.o. male with a PMH of HTN, ED, nephrolithiasis, and BPH on dual medical therapy who presented to the [facility] ED with gross hematuria per foley catheter. Urology was consulted for evaluation and recommendations. Patient follows with Dr. Arita Miss for management of BPH and urinary retention. He is currently managed qMonth exchanges of indwelling foley catheter.  Patient is accompanied by caregiver who provides majority of history. She notes that his catheter was changed approximately 2 weeks ago. It was reportedly difficult to place and took several tries, but once it was in place it drained without issue. His urine was clear yellow until sometime yesterday morning. At that time, Mr. Vardeman noted his urine became dark red and there were clots in his catheter tubing. His caregiver notes she was not feeling well throughout the day, so she was not able to evaluate it herself until today. She noted how dark it was and brought him to the ED for evaluation.  In the ED, attempts to irrigate the catheter were unsuccessful. He presented with a 16Fr 2-way. Urology attempted manual irrigation but this was met with significant resistance. Decision was made to exchange foley for a 24Fr 3-way hematuria catheter. There was return of ~300cc merlot colored urine with numerous dark clots. Catheter was manually irrigated with return of ~1L dark, old, foul-smelling clot. Urine was irrigated to light pink. Urine was observed for a few minutes and noted to darken slightly, so decision was made to initiate CBI. CBI was started on a moderate-fast rate.   Past Medical History:  Diagnosis Date   Benign prostatic hypertrophy    COVID-19 virus infection 02/24/2020   ED (erectile dysfunction)    Headache(784.0)    Hypertension    Hypogonadism male    Low back pain 06/09/2020    Nephrolithiasis    hx of had lithotripsy in 1-10.   NEPHROLITHIASIS, HX OF 10/25/2008   Qualifier: Diagnosis of   By: Clent Ridges MD, Tera Mater      Pyelonephritis 02/25/2022    Past Surgical History:  Procedure Laterality Date   colonoscopy  10/05/2020   per Dr. Russella Dar, adenomatous polyps, repeat in 3 yrs   FOOT SURGERY     LAPAROSCOPIC APPENDECTOMY  03/12/2012   Procedure: APPENDECTOMY LAPAROSCOPIC;  Surgeon: Shelly Rubenstein, MD;  Location: [facility] OR;  Service: General;  Laterality: N/A;   STAPLE HEMORRHOIDECTOMY  04/14/2010   per Dr. Michaell Cowing    TONSILLECTOMY      Current Hospital Medications:  Home Meds:  No current facility-administered medications on file prior to encounter.   Current Outpatient Medications on File Prior to Encounter  Medication Sig Dispense Refill   amLODipine (NORVASC) 5 MG tablet TAKE 1 TABLET (5 MG TOTAL) BY MOUTH DAILY. 90 tablet 3   aspirin EC 81 MG tablet Take 1 tablet (81 mg total) by mouth daily. Swallow whole. 30 tablet 12   atorvastatin (LIPITOR) 20 MG tablet Take 20 mg by mouth daily.     cyanocobalamin (VITAMIN B12) 1000 MCG/ML injection INJECT 1 ML (1,000 MCG TOTAL) INTO THE MUSCLE ONCE A WEEK. 12 mL 1   cyclobenzaprine (FLEXERIL) 10 MG tablet Take 10 mg by mouth 3 (three) times daily as needed for muscle spasms.     finasteride (PROSCAR) 5 MG tablet TAKE 1 TABLET (5 MG TOTAL) BY MOUTH DAILY. 90 tablet 1   fluticasone (  FLONASE) 50 MCG/ACT nasal spray Place 1 spray into both nostrils daily. 16 g 11   folic acid (FOLVITE) 1 MG tablet Take 1 tablet (1 mg total) by mouth daily. 30 tablet 0   furosemide (LASIX) 20 MG tablet Take 1 tablet (20 mg total) by mouth daily. 3 tablet 0   hydrochlorothiazide (HYDRODIURIL) 25 MG tablet Take 25 mg by mouth daily.     KLOR-CON M10 10 MEQ tablet Take 10 mEq by mouth 2 (two) times daily.     loperamide (IMODIUM) 2 MG capsule Take 1 capsule (2 mg total) by mouth every 8 (eight) hours as needed for diarrhea or loose stools. 30  capsule 0   losartan (COZAAR) 100 MG tablet TAKE 1 TABLET BY MOUTH EVERY DAY 90 tablet 3   Multiple Vitamin (MULTIVITAMIN WITH MINERALS) TABS tablet Take 1 tablet by mouth daily. 30 tablet 0   nitrofurantoin, macrocrystal-monohydrate, (MACROBID) 100 MG capsule Take 1 capsule (100 mg total) by mouth 2 (two) times daily. 20 capsule 0   ondansetron (ZOFRAN) 4 MG tablet Take 4 mg by mouth every 8 (eight) hours as needed for nausea or vomiting.     potassium chloride (KLOR-CON) 10 MEQ tablet Take 10 mEq by mouth 2 (two) times daily.     sertraline (ZOLOFT) 100 MG tablet TAKE 1 TABLET BY MOUTH EVERY DAY 90 tablet 0   SYRINGE-NEEDLE, DISP, 3 ML (BD ECLIPSE SYRINGE/NEEDLE) 25G X 5/8" 3 ML MISC Use as directed once a week 12 each 0   tamsulosin (FLOMAX) 0.4 MG CAPS capsule TAKE 1 CAPSULE BY MOUTH EVERY DAY 90 capsule 3   thiamine (VITAMIN B-1) 100 MG tablet Take 1 tablet (100 mg total) by mouth daily. 30 tablet 0     Scheduled Meds: Continuous Infusions: PRN Meds:.  Allergies: No Known Allergies  Family History  Problem Relation Age of Onset   Heart Problems Mother        Caused by a Virus when she was a teen   Stroke Father    Prostate cancer Father    Hypertension Other    Stroke Other    Heart disease Other        rheumatic    Coronary artery disease Other    Healthy Child    Colon cancer Neg Hx    Colon polyps Neg Hx    Esophageal cancer Neg Hx    Rectal cancer Neg Hx    Stomach cancer Neg Hx     Social History:  reports that he has never smoked. He has never used smokeless tobacco. He reports that he does not currently use alcohol after a past usage of about 14.0 standard drinks of alcohol per week. He reports that he does not use drugs.  ROS: A complete review of systems was performed.  All systems are negative except for pertinent findings as noted.  Physical Exam:  Vital signs in last 24 hours: Temp:  [98.5 F (36.9 C)] 98.5 F (36.9 C) (05/12 1830) Pulse Rate:   [87-104] 87 (05/12 1830) Resp:  [18] 18 (05/12 1830) BP: (150-183)/(90-133) 172/132 (05/12 1830) SpO2:  [89 %-97 %] 97 % (05/12 1830) Weight:  [99.8 kg] 99.8 kg (05/12 1317) Constitutional:  Alert and oriented, No acute distress Cardiovascular: Regular rate and rhythm, No JVD Respiratory: Normal respiratory effort, Lungs clear bilaterally GI: Abdomen is soft, nontender, nondistended, no abdominal masses GU: 24Fr 3-way in place, CBI on moderate/fast rate. Urine is light pink. Lymphatic: No lymphadenopathy Neurologic: Grossly intact,  no focal deficits Psychiatric: Normal mood and affect  Laboratory Data:  Recent Labs    10/28/22 1341  WBC 9.8  HGB 15.4  HCT 43.3  PLT 253    Recent Labs    10/28/22 1341  NA 133*  K 3.9  CL 101  GLUCOSE 117*  BUN 18  CALCIUM 9.1  CREATININE 0.75     Results for orders placed or performed during the hospital encounter of 10/28/22 (from the past 24 hour(s))  CBC     Status: Abnormal   Collection Time: 10/28/22  1:41 PM  Result Value Ref Range   WBC 9.8 4.0 - 10.5 K/uL   RBC 4.49 4.22 - 5.81 MIL/uL   Hemoglobin 15.4 13.0 - 17.0 g/dL   HCT 16.1 09.6 - 04.5 %   MCV 96.4 80.0 - 100.0 fL   MCH 34.3 (H) 26.0 - 34.0 pg   MCHC 35.6 30.0 - 36.0 g/dL   RDW 40.9 81.1 - 91.4 %   Platelets 253 150 - 400 K/uL   nRBC 0.0 0.0 - 0.2 %  Comprehensive metabolic panel     Status: Abnormal   Collection Time: 10/28/22  1:41 PM  Result Value Ref Range   Sodium 133 (L) 135 - 145 mmol/L   Potassium 3.9 3.5 - 5.1 mmol/L   Chloride 101 98 - 111 mmol/L   CO2 22 22 - 32 mmol/L   Glucose, Bld 117 (H) 70 - 99 mg/dL   BUN 18 8 - 23 mg/dL   Creatinine, Ser 7.82 0.61 - 1.24 mg/dL   Calcium 9.1 8.9 - 95.6 mg/dL   Total Protein 7.3 6.5 - 8.1 g/dL   Albumin 4.0 3.5 - 5.0 g/dL   AST 25 15 - 41 U/L   ALT 29 0 - 44 U/L   Alkaline Phosphatase 77 38 - 126 U/L   Total Bilirubin 0.9 0.3 - 1.2 mg/dL   GFR, Estimated >21 >30 mL/min   Anion gap 10 5 - 15  Type and  screen [facility]     Status: None   Collection Time: 10/28/22  1:41 PM  Result Value Ref Range   ABO/RH(D) O POS    Antibody Screen NEG    Sample Expiration      10/31/2022,2359 Performed at [facility], [address], [place], [place] [address]   ABO/Rh     Status: None   Collection Time: 10/28/22  1:42 PM  Result Value Ref Range   ABO/RH(D)      O POS Performed at [facility], [address] [place], [place] [address]   Urinalysis, w/ Reflex to Culture (Infection Suspected) -Urine, Clean Catch     Status: Abnormal   Collection Time: 10/28/22  2:15 PM  Result Value Ref Range   Specimen Source URINE, CLEAN CATCH    Color, Urine RED (A) YELLOW   APPearance CLOUDY (A) CLEAR   Specific Gravity, Urine 1.010 1.005 - 1.030   pH 8.5 (H) 5.0 - 8.0   Glucose, UA 100 (A) NEGATIVE mg/dL   Hgb urine dipstick LARGE (A) NEGATIVE   Bilirubin Urine LARGE (A) NEGATIVE   Ketones, ur 15 (A) NEGATIVE mg/dL   Protein, ur >952 (A) NEGATIVE mg/dL   Nitrite POSITIVE (A) NEGATIVE   Leukocytes,Ua LARGE (A) NEGATIVE   Squamous Epithelial / HPF NONE SEEN 0 - 5 /HPF   WBC, UA NONE SEEN 0 - 5 WBC/hpf   RBC / HPF >50 0 -  5 RBC/hpf   Bacteria, UA FEW (A) NONE SEEN   No results found for this or any previous visit (from the past 240 hour(s)).  Renal Function: Recent Labs    10/28/22 1341  CREATININE 0.75   Estimated Creatinine Clearance: 97.6 mL/min (by C-G formula based on SCr of 0.75 mg/dL).  Radiologic Imaging: No results found.  Impression/Recommendation #Gross hematuria - 24Fr hematuria catheter in place, CBI on moderate to fast rate - Titrate CBI to light pink - If catheter output decreases dramatically or patient develops visible clots in the catheter tubing, please pause CBI and manually irrigate with 50cc NS via catheter-tipped syringe to restore flow. If unable to do so, please obtain bladder scan and page Urology prior to  resuming CBI - Differential includes BPH, catheter irritation, UTI, nephrolithiasis, or malignancy - Send dedicated Ucx. Continue empiric IV CTX for now, cater to culture results and continue abx x14 days - Continue to trend Hgb, Cr - Will ensure outpatient follow-up for further work-up of hematuria and discussion of bladder management. Patient voiced preference to eventually get rid of his foley, and discussions regarding bladder outlet procedures will occur with established outpatient Urologist   Carlus Pavlov 10/28/2022, 7:13 PM

## 2022-10-28 NOTE — ED Notes (Signed)
Pt's BP has been in the 160 to 170s. He takes medication for hypertension but does not know the name of it. Messaged PA Victory Dakin about BP.

## 2022-10-28 NOTE — ED Provider Notes (Cosign Needed Addendum)
  Physical Exam  BP (!) 172/132   Pulse 87   Temp 98.5 F (36.9 C) (Oral)   Resp 18   Ht 6' (1.829 m)   Wt 99.8 kg   SpO2 97%   BMI 29.84 kg/m   Physical Exam Vitals and nursing note reviewed.  Constitutional:      Appearance: He is not toxic-appearing.  Eyes:     General: No scleral icterus. Pulmonary:     Effort: Pulmonary effort is normal. No respiratory distress.  Skin:    General: Skin is warm and dry.  Neurological:     Mental Status: He is alert.     Procedures  Procedures  ED Course / MDM   Clinical Course as of 10/28/22 1854  Sun Oct 28, 2022  1521 Consult with Urology Resident, Dr. Delanna Ahmadi who is agreeable with CT Cystogram at this time.  [SB]  1704 Dr. Delanna Ahmadi (Urology) requesting Urology cath cart to be brought to patient room from OR and he will see the patient at bedside.  [SB]  1739 Pt declines CT at this time. Call placed to Urology, Dr. Delanna Ahmadi who recommends bladder scan and will evaluate the patient in the ED. [SB]    Clinical Course User Index [SB] Blue, Soijett A, PA-C   Medical Decision Making Amount and/or Complexity of Data Reviewed Labs: ordered.  Risk Prescription drug management.   Accepted handoff at shift change from [facility], PA-C. Please see prior provider note for more detail.   Briefly: Patient is 77 y.o. M presents to the ER for evaluation of hematuria with catheter.   DDX: concern for urinary retention, foley catheter issue.   Plan: follow up with urology recommendations who is currently assessing at bedside.   Bladder scan shows with old catheter placement. Dr. Delanna Ahmadi was able to place a 24 Fr and had approximately 375 in outflow afterwards. Old catheter contained clots not allowing for irrigation of urine flow.   I spoke with Dr. Delanna Ahmadi with Urology. He recommends medical admisison for CBI to light pink. Manual irrigation as needed. Rocephin 2g every 12-24 hours. Additionally, I did add on a urine  culture. Patient also deconditioned, could benefit from PT/OT evaluation. Consult to hospitalist placed.   Admit to Dr. Thomes Dinning. BP medications ordered.     Achille Rich, PA-C 10/28/22 1939    Achille Rich, PA-C 10/28/22 1940    Glendora Score, MD 10/29/22 1332

## 2022-10-28 NOTE — H&P (Signed)
History and Physical    Patient: Calvin [name] ZOX:[id number] DOB: 10-02-1945 DOA: 10/28/2022 DOS: the patient was seen and examined on 10/28/2022 PCP: Calvin Salisbury, MD  Patient coming from: Home  Chief Complaint:  Chief Complaint  Patient presents with   Hematuria   HPI: Calvin [name] is a 77 y.o. male with medical history significant of chronic bladder outlet obstruction/BPH with chronic Foley catheter, history of recurrent UTI, nephrolithiasis, hypertension, hyperlipidemia, depression who presents to the emergency department who presents to the emergency department due to 1 day onset of gross hematuria per  Foley catheter.  Patient states that the Foley catheter was last changed about 2 weeks ago and it was draining clear yellow urine until yesterday when patient noted that the urine in the Foley bag became red with blood clots in the Foley catheter.  Patient presented to the emergency department for further evaluation and management.  He denies fever, chills, chest pain, shortness of breath, abdominal pain, nausea, vomiting or any painful urination.  ED Course:  In the emergency department, patient was tachycardic with a pulse of 101 bpm, BP 150/90, but other vital signs were within normal range.  Workup in the ED showed normal CBC and BMP except for sodium of 133 and blood glucose of 117.  Urinalysis showed hematuria and positive nitrite, however patient denies any painful urination or any other irritative bladder symptoms. ED team tried to irrigate the catheter without success, so urologist was consulted and a 24 [place] three-way hematuria catheter was placed, this was manually irrigated with return of 1 L dark, or foul-smelling clot.  CBI was indicated and hospitalist was asked to admit patient for further evaluation and management.  Review of Systems: Review of systems as noted in the HPI. All other systems reviewed and are negative.   Past Medical History:  Diagnosis Date    Benign prostatic hypertrophy    COVID-19 virus infection 02/24/2020   ED (erectile dysfunction)    Headache(784.0)    Hypertension    Hypogonadism male    Low back pain 06/09/2020   Nephrolithiasis    hx of had lithotripsy in 1-10.   NEPHROLITHIASIS, HX OF 10/25/2008   Qualifier: Diagnosis of   By: Calvin Ridges MD, Calvin [name]      Pyelonephritis 02/25/2022   Past Surgical History:  Procedure Laterality Date   colonoscopy  10/05/2020   per Dr. Russella [name], adenomatous polyps, repeat in 3 yrs   FOOT SURGERY     LAPAROSCOPIC APPENDECTOMY  03/12/2012   Procedure: APPENDECTOMY LAPAROSCOPIC;  Surgeon: Calvin Rubenstein, MD;  Location: [facility] OR;  Service: General;  Laterality: N/A;   STAPLE HEMORRHOIDECTOMY  04/14/2010   per Dr. Michaell [name]    TONSILLECTOMY      Social History:  reports that he has never smoked. He has never used smokeless tobacco. He reports that he does not currently use alcohol after a past usage of about 14.0 standard drinks of alcohol per week. He reports that he does not use drugs.   No Known Allergies  Family History  Problem Relation Age of Onset   Heart Problems Mother        Caused by a Virus when she was a teen   Stroke Father    Prostate cancer Father    Hypertension Other    Stroke Other    Heart disease Other        rheumatic    Coronary artery disease Other    Healthy Child  Colon cancer Neg Hx    Colon polyps Neg Hx    Esophageal cancer Neg Hx    Rectal cancer Neg Hx    Stomach cancer Neg Hx      Prior to Admission medications   Medication Sig Start Date End Date Taking? Authorizing Calvin [name]  amLODipine (NORVASC) 5 MG tablet TAKE 1 TABLET (5 MG TOTAL) BY MOUTH DAILY. 05/03/22   Calvin Salisbury, MD  aspirin EC 81 MG tablet Take 1 tablet (81 mg total) by mouth daily. Swallow whole. 06/01/22   Calvin Merles Latif, [name]  atorvastatin (LIPITOR) 20 MG tablet Take 20 mg by mouth daily. 07/30/22   Calvin [name], Historical, MD  cyanocobalamin (VITAMIN B12) 1000 MCG/ML  injection INJECT 1 ML (1,000 MCG TOTAL) INTO THE MUSCLE ONCE A WEEK. 03/13/22   Calvin Salisbury, MD  cyclobenzaprine (FLEXERIL) 10 MG tablet Take 10 mg by mouth 3 (three) times daily as needed for muscle spasms. 03/04/22   Calvin [name], Historical, MD  finasteride (PROSCAR) 5 MG tablet TAKE 1 TABLET (5 MG TOTAL) BY MOUTH DAILY. 10/01/22   Calvin Salisbury, MD  fluticasone (FLONASE) 50 MCG/ACT nasal spray Place 1 spray into both nostrils daily. 10/10/22   Calvin Person, MD  folic acid (FOLVITE) 1 MG tablet Take 1 tablet (1 mg total) by mouth daily. 06/01/22   Calvin Merles Latif, [name]  furosemide (LASIX) 20 MG tablet Take 1 tablet (20 mg total) by mouth daily. 08/09/22   Calvin Bucco, MD  hydrochlorothiazide (HYDRODIURIL) 25 MG tablet Take 25 mg by mouth daily. 07/16/22   Calvin [name], Historical, MD  KLOR-CON M10 10 MEQ tablet Take 10 mEq by mouth 2 (two) times daily. 03/22/22   Calvin [name], Historical, MD  loperamide (IMODIUM) 2 MG capsule Take 1 capsule (2 mg total) by mouth every 8 (eight) hours as needed for diarrhea or loose stools. 03/21/22   Calvin Lloyd, MD  losartan (COZAAR) 100 MG tablet TAKE 1 TABLET BY MOUTH EVERY DAY 05/03/22   Calvin Salisbury, MD  Multiple Vitamin (MULTIVITAMIN WITH MINERALS) TABS tablet Take 1 tablet by mouth daily. 06/01/22   Calvin [name]  nitrofurantoin, macrocrystal-monohydrate, (MACROBID) 100 MG capsule Take 1 capsule (100 mg total) by mouth 2 (two) times daily. 10/09/22   Calvin Salisbury, MD  ondansetron (ZOFRAN) 4 MG tablet Take 4 mg by mouth every 8 (eight) hours as needed for nausea or vomiting.    Calvin [name], Historical, MD  potassium chloride (KLOR-CON) 10 MEQ tablet Take 10 mEq by mouth 2 (two) times daily. 07/16/22   Calvin [name], Historical, MD  sertraline (ZOLOFT) 100 MG tablet TAKE 1 TABLET BY MOUTH EVERY DAY 10/01/22   Calvin Salisbury, MD  SYRINGE-NEEDLE, DISP, 3 ML (BD ECLIPSE SYRINGE/NEEDLE) 25G X 5/8" 3 ML MISC Use as directed once a week 12/26/21   Calvin Salisbury, MD   tamsulosin (FLOMAX) 0.4 MG CAPS capsule TAKE 1 CAPSULE BY MOUTH EVERY DAY 05/03/22   Calvin Salisbury, MD  thiamine (VITAMIN B-1) 100 MG tablet Take 1 tablet (100 mg total) by mouth daily. 06/01/22   Merlene Laughter, [name]    Physical Exam: BP (!) 172/125   Pulse 93   Temp 98.5 F (36.9 C) (Oral)   Resp 18   Ht 6' (1.829 m)   Wt 99.8 kg   SpO2 96%   BMI 29.84 kg/m   General: 77 y.o. year-old male well developed well nourished in no acute distress.  Alert and oriented x3. HEENT: NCAT,  EOMI Neck: Supple, trachea medial Cardiovascular: Regular rate and rhythm with no rubs or gallops.  No thyromegaly or JVD noted.  No lower extremity edema. 2/4 pulses in all 4 extremities. Respiratory: Clear to auscultation with no wheezes or rales. Good inspiratory effort. Abdomen: Soft, nontender nondistended with normal bowel sounds x4 quadrants. Genitourinary: Patient undergoing CBI with red blood in the Foley catheter Muskuloskeletal: No cyanosis, clubbing or edema noted bilaterally Neuro: CN II-XII intact, strength 5/5 x 4, sensation, reflexes intact Skin: No ulcerative lesions noted or rashes Psychiatry: Judgement and insight appear normal. Mood is appropriate for condition and setting          Labs on Admission:  Basic Metabolic Panel: Recent Labs  Lab 10/28/22 1341  NA 133*  K 3.9  CL 101  CO2 22  GLUCOSE 117*  BUN 18  CREATININE 0.75  CALCIUM 9.1   Liver Function Tests: Recent Labs  Lab 10/28/22 1341  AST 25  ALT 29  ALKPHOS 77  BILITOT 0.9  PROT 7.3  ALBUMIN 4.0   No results for input(s): "LIPASE", "AMYLASE" in the last 168 hours. No results for input(s): "AMMONIA" in the last 168 hours. CBC: Recent Labs  Lab 10/28/22 1341  WBC 9.8  HGB 15.4  HCT 43.3  MCV 96.4  PLT 253   Cardiac Enzymes: No results for input(s): "CKTOTAL", "CKMB", "CKMBINDEX", "TROPONINI" in the last 168 hours.  BNP (last 3 results) Recent Labs    08/09/22 1558  BNP 94.6    ProBNP  (last 3 results) No results for input(s): "PROBNP" in the last 8760 hours.  CBG: No results for input(s): "GLUCAP" in the last 168 hours.  Radiological Exams on Admission: No results found.  EKG: I independently viewed the EKG done and my findings are as followed: EKG was not done in the ED  Assessment/Plan Present on Admission:  Essential hypertension  Bladder outflow obstruction  CVA (cerebral vascular accident) [facility])  Depression  Principal Problem:   Gross hematuria Active Problems:   Essential hypertension   Depression   Bladder outflow obstruction   CVA (cerebral vascular accident) (HCC)   Hypertensive urgency   Mixed hyperlipidemia  Gross hematuria Patient presents with gross hematuria with difficulty in being able to change the catheter. Urologist was consulted, catheter was changed to 24 [place] hematuria catheter, CBI, on moderate to fast rate with goal to titrate to light pink. Empiric IV ceftriaxone was started Continue to trend H/H and creatinine per urology recommendation Urology consult appreciated  Hypertensive urgency - resolved Essential hypertension Patient states that he has not taken his BP meds today Continue amlodipine, losartan  Chronic bladder outlet obstruction/BPH Continue Foley catheter Continue Flomax and Proscar  Mixed hyperlipidemia Continue Lipitor  History of CVA Continue aspirin, Lipitor  Depression Continue Zoloft  Other home meds: Multivitamin  DVT prophylaxis: SCDs  Advance Care Planning: Full code  Consults: Urology  Family Communication: None at bedside  Severity of Illness: The appropriate patient status for this patient is INPATIENT. Inpatient status is judged to be reasonable and necessary in order to provide the required intensity of service to ensure the patient's safety. The patient's presenting symptoms, physical exam findings, and initial radiographic and laboratory data in the context of their chronic  comorbidities is felt to place them at high risk for further clinical deterioration. Furthermore, it is not anticipated that the patient will be medically stable for discharge from the hospital within 2 midnights of admission.   * I certify that at  the point of admission it is my clinical judgment that the patient will require inpatient hospital care spanning beyond 2 midnights from the point of admission due to high intensity of service, high risk for further deterioration and high frequency of surveillance required.*  Author: Frankey Shown, [name] 10/28/2022 9:35 PM  For on call review [email or web address].

## 2022-10-28 NOTE — ED Notes (Signed)
Attempted to flush catheter but unable to because it was resistant, however, it drained blood

## 2022-10-29 DIAGNOSIS — R319 Hematuria, unspecified: Secondary | ICD-10-CM | POA: Diagnosis present

## 2022-10-29 DIAGNOSIS — R310 Gross hematuria: Secondary | ICD-10-CM | POA: Diagnosis not present

## 2022-10-29 LAB — BASIC METABOLIC PANEL
Anion gap: 9 (ref 5–15)
BUN: 16 mg/dL (ref 8–23)
CO2: 22 mmol/L (ref 22–32)
Calcium: 8.7 mg/dL — ABNORMAL LOW (ref 8.9–10.3)
Chloride: 97 mmol/L — ABNORMAL LOW (ref 98–111)
Creatinine, Ser: 0.74 mg/dL (ref 0.61–1.24)
GFR, Estimated: >60 mL/min (ref >60)
Glucose, Bld: 137 mg/dL — ABNORMAL HIGH (ref 70–99)
Potassium: 3.4 mmol/L — ABNORMAL LOW (ref 3.5–5.1)
Sodium: 128 mmol/L — ABNORMAL LOW (ref 135–145)

## 2022-10-29 LAB — COMPREHENSIVE METABOLIC PANEL
ALT: 32 U/L (ref 0–44)
AST: 26 U/L (ref 15–41)
Albumin: 4 g/dL (ref 3.5–5.0)
Alkaline Phosphatase: 87 U/L (ref 38–126)
Anion gap: 10 (ref 5–15)
BUN: 16 mg/dL (ref 8–23)
CO2: 25 mmol/L (ref 22–32)
Calcium: 9.1 mg/dL (ref 8.9–10.3)
Chloride: 97 mmol/L — ABNORMAL LOW (ref 98–111)
Creatinine, Ser: 0.75 mg/dL (ref 0.61–1.24)
GFR, Estimated: >60 mL/min (ref >60)
Glucose, Bld: 132 mg/dL — ABNORMAL HIGH (ref 70–99)
Potassium: 4.1 mmol/L (ref 3.5–5.1)
Sodium: 132 mmol/L — ABNORMAL LOW (ref 135–145)
Total Bilirubin: 1.5 mg/dL — ABNORMAL HIGH (ref 0.3–1.2)
Total Protein: 7.4 g/dL (ref 6.5–8.1)

## 2022-10-29 LAB — CBC
HCT: 44.6 % (ref 39.0–52.0)
Hemoglobin: 15.5 g/dL (ref 13.0–17.0)
MCH: 33.6 pg (ref 26.0–34.0)
MCHC: 34.8 g/dL (ref 30.0–36.0)
MCV: 96.7 fL (ref 80.0–100.0)
Platelets: 249 10*3/uL (ref 150–400)
RBC: 4.61 MIL/uL (ref 4.22–5.81)
RDW: 13.2 % (ref 11.5–15.5)
WBC: 10.7 10*3/uL — ABNORMAL HIGH (ref 4.0–10.5)
nRBC: 0 % (ref 0.0–0.2)

## 2022-10-29 LAB — MAGNESIUM: Magnesium: 2 mg/dL (ref 1.7–2.4)

## 2022-10-29 LAB — PHOSPHORUS: Phosphorus: 4 mg/dL (ref 2.5–4.6)

## 2022-10-29 LAB — HEMOGLOBIN AND HEMATOCRIT, BLOOD
HCT: 43.5 % (ref 39.0–52.0)
Hemoglobin: 15.4 g/dL (ref 13.0–17.0)

## 2022-10-29 MED ORDER — ORAL CARE MOUTH RINSE: 15.0000 mL | OROMUCOSAL | Status: DC | PRN

## 2022-10-29 MED ORDER — CEPHALEXIN 500 MG PO CAPS: 500.0000 mg | ORAL_CAPSULE | Freq: Two times a day (BID) | ORAL | 0 refills | Status: AC

## 2022-10-29 NOTE — Progress Notes (Signed)
  Subjective: NAEO. No complaints. CBI titrated to clear yellow. CBI clamped this am by Urology on rounds.  Objective: Vital signs in last 24 hours: Temp:  [98 F (36.7 C)-98.8 F (37.1 C)] 98.8 F (37.1 C) (05/13 0624) Pulse Rate:  [83-104] 98 (05/13 0624) Resp:  [18-20] 20 (05/13 0624) BP: (133-194)/(90-133) 152/98 (05/13 0624) SpO2:  [89 %-97 %] 97 % (05/13 0624) Weight:  [99.8 kg] 99.8 kg (05/12 1317)  Intake/Output from previous day: 05/12 0701 - 05/13 0700 In: 16109 [P.O.:60] Out: 9150 [Urine:9150] Intake/Output this shift: No intake/output data recorded.  Physical Exam:  General: Alert and oriented CV: RRR Lungs: Clear Abdomen: Soft, ND GU: Urine clear yellow. CBI clamped Ext: NT, No erythema  Lab Results: Recent Labs    10/28/22 1341 10/28/22 2207 10/29/22 0423  HGB 15.4 15.0 15.5  HCT 43.3 42.8 44.6   BMET Recent Labs    10/28/22 1341 10/29/22 0423  NA 133* 132*  K 3.9 4.1  CL 101 97*  CO2 22 25  GLUCOSE 117* 132*  BUN 18 16  CREATININE 0.75 0.75  CALCIUM 9.1 9.1     Studies/Results: No results found.  Assessment/Plan: #Gross hematuria - 24Fr hematuria catheter in place, CBI clamped - If catheter output decreases dramatically or patient develops visible clots in the catheter tubing, please pause CBI and manually irrigate with 50cc NS via catheter-tipped syringe to restore flow. If unable to do so, please obtain bladder scan and page Urology prior to resuming CBI - Differential includes BPH, catheter irritation, UTI, nephrolithiasis, or malignancy - Continue empiric IV CTX for now, cater to culture results and continue abx x14 days - Continue to trend Hgb, Cr - Will ensure outpatient follow-up for further work-up of hematuria and discussion of bladder management. Patient voiced preference to eventually get rid of his foley, and discussions regarding bladder outlet procedures will occur with established outpatient Urologist - If urine remains  clear, patient should be able to discharge later today   LOS: 1 day   Calvin [name] 10/29/2022, 7:27 AM

## 2022-10-29 NOTE — Progress Notes (Signed)
  Transition of Care [facility]) Screening Note   Patient Details  Name: BARNEY GESSERT Date of Birth: 1945/10/30   [facility]) CM/SW Contact:    Lanier Clam, RN Phone Number: 10/29/2022, 12:11 PM    Transition of Care Department [facility]) has reviewed patient and no TOC needs have been identified at this time. We will continue to monitor patient advancement through interdisciplinary progression rounds. If new patient transition needs arise, please place a TOC consult.

## 2022-10-29 NOTE — Care Management Obs Status (Signed)
MEDICARE OBSERVATION STATUS NOTIFICATION   Patient Details  Name: Calvin [name] [name]: [id number] Date of Birth: [birth year]   Medicare Observation Status Notification Given:  Yes    MahabirOlegario Messier, RN 10/29/2022, 1:43 PM

## 2022-10-29 NOTE — Discharge Summary (Signed)
Physician Discharge Summary  Calvin [name] ZOX:[id number] DOB: 1945-08-29 DOA: 10/28/2022  PCP: [name], MD  Admit date: 10/28/2022 Discharge date: 10/29/2022 Discharging to: home Recommendations for Outpatient Follow-up:  F/u with urology   Consults:  Urology Procedures: # way foley and CBI   Brief hospital course: This is a 77 year old male with BPH and chronic Foley, nephrolithiasis, history of recurrent UTIs, hypertension, hyperlipidemia who presented to the ED with 1 day of hematuria.  Patient stated the catheter was changed about 2 weeks ago.  States he has problems with the catheter clogging up and sometimes it needs to be changed sooner than the recommended 1 month.   In the ED: Tachycardic with heart rate of 101, BP 150/90   Sodium 133 UA revealed hematuria and nitrites Urologist placed three-way catheter which was manually irrigated and he passed a "foul-smelling clot".  He was placed on CBI     Assessment and Plan: Principal Problem:   Gross hematuria -Catheter draining clear urine-CBI ended this AM - Appreciate urology evaluation - Continue tamsulosin and finasteride -   also takes a baby aspirin which is being continued - Empiric Rocephin was added-urine culture is not yet resulted-Macrobid on hold- Urology recommends 7 days of antibiotics  - last urine culture > K pneumoniae - dc on Keflex  Active Problems:   Essential hypertension with hypertensive urgency on admission - Amlodipine and losartan being given - HCTZ and furosemide on hold in hospital              Discharge Instructions  Discharge Instructions     Diet - low sodium heart healthy   Complete by: As directed    Increase activity slowly   Complete by: As directed       Allergies as of 10/29/2022   No Known Allergies      Medication List     STOP taking these medications    nitrofurantoin (macrocrystal-monohydrate) 100 MG capsule Commonly known as: Macrobid       TAKE  these medications    amLODipine 5 MG tablet Commonly known as: NORVASC TAKE 1 TABLET (5 MG TOTAL) BY MOUTH DAILY.   aspirin EC 81 MG tablet Take 1 tablet (81 mg total) by mouth daily. Swallow whole.   atorvastatin 20 MG tablet Commonly known as: LIPITOR Take 20 mg by mouth daily.   BD Eclipse Syringe/Needle 25G X 5/8" 3 ML Misc Generic drug: SYRINGE-NEEDLE (DISP) 3 ML Use as directed once a week   cephALEXin 500 MG capsule Commonly known as: KEFLEX Take 1 capsule (500 mg total) by mouth 2 (two) times daily for 7 days.   cyanocobalamin 1000 MCG/ML injection Commonly known as: VITAMIN B12 INJECT 1 ML (1,000 MCG TOTAL) INTO THE MUSCLE ONCE A WEEK.   cyclobenzaprine 10 MG tablet Commonly known as: FLEXERIL Take 10 mg by mouth 3 (three) times daily as needed for muscle spasms.   finasteride 5 MG tablet Commonly known as: PROSCAR TAKE 1 TABLET (5 MG TOTAL) BY MOUTH DAILY.   fluticasone 50 MCG/ACT nasal spray Commonly known as: FLONASE Place 1 spray into both nostrils daily.   folic acid 1 MG tablet Commonly known as: FOLVITE Take 1 tablet (1 mg total) by mouth daily.   furosemide 20 MG tablet Commonly known as: LASIX Take 1 tablet (20 mg total) by mouth daily.   hydrochlorothiazide 25 MG tablet Commonly known as: HYDRODIURIL Take 25 mg by mouth daily.   Klor-Con M10 10 MEQ tablet Generic drug:  potassium chloride Take 10 mEq by mouth 2 (two) times daily.   loperamide 2 MG capsule Commonly known as: IMODIUM Take 1 capsule (2 mg total) by mouth every 8 (eight) hours as needed for diarrhea or loose stools.   losartan 100 MG tablet Commonly known as: COZAAR TAKE 1 TABLET BY MOUTH EVERY DAY   multivitamin with minerals Tabs tablet Take 1 tablet by mouth daily.   ondansetron 4 MG tablet Commonly known as: ZOFRAN Take 4 mg by mouth every 8 (eight) hours as needed for nausea or vomiting.   potassium chloride 10 MEQ tablet Commonly known as: KLOR-CON Take 10 mEq  by mouth 2 (two) times daily.   sertraline 100 MG tablet Commonly known as: ZOLOFT TAKE 1 TABLET BY MOUTH EVERY DAY   tamsulosin 0.4 MG Caps capsule Commonly known as: FLOMAX TAKE 1 CAPSULE BY MOUTH EVERY DAY   thiamine 100 MG tablet Commonly known as: Vitamin B-1 Take 1 tablet (100 mg total) by mouth daily.            The results of significant diagnostics from this hospitalization (including imaging, microbiology, ancillary and laboratory) are listed below for reference.    No results found. Labs:   Basic Metabolic Panel: Recent Labs  Lab 10/28/22 1341 10/29/22 0423 10/29/22 1142  NA 133* 132* 128*  K 3.9 4.1 3.4*  CL 101 97* 97*  CO2 22 25 22   GLUCOSE 117* 132* 137*  BUN 18 16 16   CREATININE 0.75 0.75 0.74  CALCIUM 9.1 9.1 8.7*  MG  --  2.0  --   PHOS  --  4.0  --      CBC: Recent Labs  Lab 10/28/22 1341 10/28/22 2207 10/29/22 0423 10/29/22 1142  WBC 9.8  --  10.7*  --   HGB 15.4 15.0 15.5 15.4  HCT 43.3 42.8 44.6 43.5  MCV 96.4  --  96.7  --   PLT 253  --  249  --          SIGNED:   Calvert Cantor, MD  [facility] 10/29/2022, 12:08 PM

## 2022-10-29 NOTE — Care Management CC44 (Signed)
Condition Code 44 Documentation Completed  Patient Details  Name: JAYQUAN GOTSHALL [name]: [id number] Date of Birth: [birth year]   Condition Code 44 given:  Yes Patient signature on Condition Code 44 notice:  Yes Documentation of 2 MD's agreement:  Yes Code 44 added to claim:  Yes    Lanier Clam, RN 10/29/2022, 1:43 PM

## 2022-10-29 NOTE — Progress Notes (Signed)
Discharge instructions were reviewed. Foley catheter education reviewed. Supplies provided. Pt denied questions, concerns at this time.

## 2022-10-29 NOTE — Progress Notes (Signed)
[facility] Progress Note  Patient: Calvin [name]     ZOX:[id number]  DOA: 10/28/2022   PCP: [name], MD       Brief hospital course: This is a 77 year old male with BPH and chronic Foley, nephrolithiasis, history of recurrent UTIs, hypertension, hyperlipidemia who presented to the ED with 1 day of hematuria.  Patient stated the catheter was changed about 2 weeks ago.  States he has problems with the catheter clogging up and sometimes it needs to be changed sooner than the recommended 1 month.  In the ED: Tachycardic with heart rate of 101, BP 150/90  Sodium 133 UA revealed hematuria and nitrites Urologist placed three-way catheter which was manually irrigated and he passed a "foul-smelling clot".  He was placed on CBI  Subjective:  He feels weak and states that he is going to need a walker more frequently now.  He has no other complaints  Assessment and Plan: Principal Problem:   Gross hematuria -Catheter draining clear urine-CBI in progress - Appreciate urology evaluation - Empiric Rocephin was added-urine culture is not yet resulted-Macrobid on hold - Continue tamsulosin and finasteride - She also takes a baby aspirin which is being continued  Active Problems:   Essential hypertension with hypertensive urgency on admission - Amlodipine and losartan being given - HCTZ and furosemide on hold      Code Status: Full Code Consultants: Urology Level of Care: Level of care: Med-Surg Total time on patient care: 35 minutes DVT prophylaxis:  SCDs Start: 10/28/22 2124     Objective:   Vitals:   10/29/22 0224 10/29/22 0225 10/29/22 0624 10/29/22 0954  BP: (!) 194/122 (!) 165/109 (!) 152/98 (!) 136/104  Pulse: 90 87 98 91  Resp: 20 20 20 18   Temp: 98 F (36.7 C) 98 F (36.7 C) 98.8 F (37.1 C) 98.1 F (36.7 C)  TempSrc: Oral Oral Oral Oral  SpO2: 96% 96% 97% 97%  Weight:      Height:       Filed Weights   10/28/22 1317  Weight: 99.8 kg    Exam: General exam: Appears comfortable  HEENT: oral mucosa moist-hard of hearing Respiratory system: Clear to auscultation.  Cardiovascular system: S1 & S2 heard  Gastrointestinal system: Abdomen soft, non-tender, nondistended. Normal bowel sounds   Extremities: No cyanosis, clubbing or edema Psychiatry:  Mood & affect appropriate.      CBC: Recent Labs  Lab 10/28/22 1341 10/28/22 2207 10/29/22 0423  WBC 9.8  --  10.7*  HGB 15.4 15.0 15.5  HCT 43.3 42.8 44.6  MCV 96.4  --  96.7  PLT 253  --  249   Basic Metabolic Panel: Recent Labs  Lab 10/28/22 1341 10/29/22 0423  NA 133* 132*  K 3.9 4.1  CL 101 97*  CO2 22 25  GLUCOSE 117* 132*  BUN 18 16  CREATININE 0.75 0.75  CALCIUM 9.1 9.1  MG  --  2.0  PHOS  --  4.0   GFR: Estimated Creatinine Clearance: 97.6 mL/min (by C-G formula based on SCr of 0.75 mg/dL).  Scheduled Meds:  amLODipine  5 mg Oral Daily   aspirin EC  81 mg Oral Daily   atorvastatin  20 mg Oral Daily   Chlorhexidine Gluconate Cloth  6 each Topical Daily   finasteride  5 mg Oral Daily   losartan  100 mg Oral Daily   multivitamin with minerals  1 tablet Oral Daily   sertraline  100 mg Oral Daily  tamsulosin  0.4 mg Oral Daily   Continuous Infusions:  cefTRIAXone (ROCEPHIN)  IV     sodium chloride irrigation     Imaging and lab data was personally reviewed No results found.  LOS: 1 day   Author: Calvert Cantor  10/29/2022 10:33 AM  To contact [facility]>   Check the care team in [facility] and look for the attending/consulting [facility] provider listed  Log into [email or web address] and use [employer] universal password   Go to> "[facility]"  and find provider  If you still have difficulty reaching the provider, please page the [facility] (Director on Call) for the Hospitalists listed on amion

## 2022-10-31 LAB — URINE CULTURE: Culture: 50000 — AB

## 2022-11-06 DIAGNOSIS — Z466 Encounter for fitting and adjustment of urinary device: Secondary | ICD-10-CM | POA: Diagnosis not present

## 2022-11-06 DIAGNOSIS — I10 Essential (primary) hypertension: Secondary | ICD-10-CM | POA: Diagnosis not present

## 2022-11-06 DIAGNOSIS — F32A Depression, unspecified: Secondary | ICD-10-CM | POA: Diagnosis not present

## 2022-11-06 DIAGNOSIS — N401 Enlarged prostate with lower urinary tract symptoms: Secondary | ICD-10-CM | POA: Diagnosis not present

## 2022-11-06 DIAGNOSIS — R338 Other retention of urine: Secondary | ICD-10-CM | POA: Diagnosis not present

## 2022-11-06 DIAGNOSIS — Z9181 History of falling: Secondary | ICD-10-CM | POA: Diagnosis not present

## 2022-11-06 DIAGNOSIS — E538 Deficiency of other specified B group vitamins: Secondary | ICD-10-CM | POA: Diagnosis not present

## 2022-11-06 DIAGNOSIS — Z7982 Long term (current) use of aspirin: Secondary | ICD-10-CM | POA: Diagnosis not present

## 2022-11-06 DIAGNOSIS — N3001 Acute cystitis with hematuria: Secondary | ICD-10-CM | POA: Diagnosis not present

## 2022-11-06 DIAGNOSIS — Z8673 Personal history of transient ischemic attack (TIA), and cerebral infarction without residual deficits: Secondary | ICD-10-CM | POA: Diagnosis not present

## 2022-11-06 DIAGNOSIS — N320 Bladder-neck obstruction: Secondary | ICD-10-CM | POA: Diagnosis not present

## 2022-11-06 DIAGNOSIS — B961 Klebsiella pneumoniae [K. pneumoniae] as the cause of diseases classified elsewhere: Secondary | ICD-10-CM | POA: Diagnosis not present

## 2022-11-06 DIAGNOSIS — F418 Other specified anxiety disorders: Secondary | ICD-10-CM | POA: Diagnosis not present

## 2022-11-09 ENCOUNTER — Telehealth: Payer: Self-pay

## 2022-11-09 NOTE — Progress Notes (Signed)
Patient ID: Calvin [name], male   DOB: 03/01/1946, 77 y.o.   [name]: [id number]  Care Management & Coordination Services Pharmacy Team  Reason for Encounter: General adherence update   Contacted patient for general health update and medication adherence call.  Spoke with patient  and caregiver on 11/09/2022    What concerns do you have about your medications? None reported  The patient denies side effects with their medications.   How often do you forget or accidentally miss a dose? Never  Do you use a pillbox? No  Are you having any problems getting your medications from your pharmacy? No  Has the cost of your medications been a concern? No  The patient has had an ED visit since last contact.   The patient denies problems with their health.   Patient denies concerns or questions for Milas Kocher, PharmD at this time.   Counseled patient on: Great job taking medications and Access to carecoordination team for any cost, medication or pharmacy concerns.    Chart Updates:  Recent office visits:  08/09/22 Philip Aspen, Limmie Patricia, MD - Patient presented for Shortness of breath and other concerns. Referred to ED.  Recent consult visits:  10/10/22 Omar Person, MD (Pulmonary Care) -Patient presented for Excessive daytime sleepiness and other concerns. Prescribed Fluticasone. Stopped Cephalexin. Stopped Bactrim.   10/02/22 Corky Crafts, MD (Cardiology) - Patient presented for Shortness of breath and other concerns. Decreased Atorvastatin.   08/31/22 Kasandra Knudsen D (Urology) - Claims encounter for Flaccid neuropathic bladder and other concerns. No other visit details available.   08/14/22 Ray Church III (Urology) - Patient presented for Benign prostatic hyperplasia with lower urinary tract symptoms and other concerns. No other visit details available.   Hospital visits:  Medication Reconciliation was completed by comparing discharge summary, patient's EMR and Pharmacy  list, and upon discussion with patient.  Patient presented to [facility] on 10/28/22 due to Gross Hematuria. Patient was present for 26 hours.  New?Medications Started at [facility] Discharge:?? -started  cephALEXin [facility])  Medication Changes at Hospital Discharge: -Changed  none  Medications Discontinued at Hospital Discharge: -Stopped  nitrofurantoin (macrocrystal-monohydrate) Medications that remain the same after Hospital Discharge:??  -All other medications will remain the same.     Medication Reconciliation was completed by comparing discharge summary, patient's EMR and Pharmacy list, and upon discussion with patient.  Patient presented to [facility] ED on 08/09/22 due to Shortness of Breath Patient was present for 8 hours.  New?Medications Started at [facility] Discharge:?? -started  furosemide 20 MG tablet  Medication Changes at Hospital Discharge: -Changed  none  Medications Discontinued at Hospital Discharge: -Stopped  none -All other medications will remain the same.   Medications: Outpatient Encounter Medications as of 11/09/2022  Medication Sig Note   amLODipine (NORVASC) 5 MG tablet TAKE 1 TABLET (5 MG TOTAL) BY MOUTH DAILY.    aspirin EC 81 MG tablet Take 1 tablet (81 mg total) by mouth daily. Swallow whole.    atorvastatin (LIPITOR) 20 MG tablet Take 20 mg by mouth daily.    cyanocobalamin (VITAMIN B12) 1000 MCG/ML injection INJECT 1 ML (1,000 MCG TOTAL) INTO THE MUSCLE ONCE A WEEK. 05/27/2022: Recent Dispenses     03/13/2022 1000 MCG/ML SOLN (disp 12, 84d supply)     cyclobenzaprine (FLEXERIL) 10 MG tablet Take 10 mg by mouth 3 (three) times daily as needed for muscle spasms. 05/27/2022: Recent Dispenses     03/04/2022 10 MG TABS (disp 60,  20d supply)     finasteride (PROSCAR) 5 MG tablet TAKE 1 TABLET (5 MG TOTAL) BY MOUTH DAILY.    fluticasone (FLONASE) 50 MCG/ACT nasal spray Place 1 spray into both nostrils daily.    folic acid  (FOLVITE) 1 MG tablet Take 1 tablet (1 mg total) by mouth daily.    furosemide (LASIX) 20 MG tablet Take 1 tablet (20 mg total) by mouth daily.    hydrochlorothiazide (HYDRODIURIL) 25 MG tablet Take 25 mg by mouth daily.    KLOR-CON M10 10 MEQ tablet Take 10 mEq by mouth 2 (two) times daily. 05/27/2022: Recent Dispenses     03/22/2022 10 MEQ TBCR (disp 180, 90d supply)     loperamide (IMODIUM) 2 MG capsule Take 1 capsule (2 mg total) by mouth every 8 (eight) hours as needed for diarrhea or loose stools. 05/27/2022: Recent Dispenses     03/21/2022 2 MG CAPS (disp 30, 10d supply)     losartan (COZAAR) 100 MG tablet TAKE 1 TABLET BY MOUTH EVERY DAY    Multiple Vitamin (MULTIVITAMIN WITH MINERALS) TABS tablet Take 1 tablet by mouth daily.    ondansetron (ZOFRAN) 4 MG tablet Take 4 mg by mouth every 8 (eight) hours as needed for nausea or vomiting. 05/27/2022: No data available.   potassium chloride (KLOR-CON) 10 MEQ tablet Take 10 mEq by mouth 2 (two) times daily.    sertraline (ZOLOFT) 100 MG tablet TAKE 1 TABLET BY MOUTH EVERY DAY    SYRINGE-NEEDLE, DISP, 3 ML (BD ECLIPSE SYRINGE/NEEDLE) 25G X 5/8" 3 ML MISC Use as directed once a week    tamsulosin (FLOMAX) 0.4 MG CAPS capsule TAKE 1 CAPSULE BY MOUTH EVERY DAY    thiamine (VITAMIN B-1) 100 MG tablet Take 1 tablet (100 mg total) by mouth daily.    No facility-administered encounter medications on file as of 11/09/2022.    Recent vitals BP Readings from Last 3 Encounters:  10/29/22 114/79  10/10/22 126/76  10/02/22 102/70   Pulse Readings from Last 3 Encounters:  10/29/22 98  10/10/22 63  10/02/22 99   Wt Readings from Last 3 Encounters:  10/28/22 220 lb (99.8 kg)  10/10/22 238 lb (108 kg)  10/02/22 238 lb (108 kg)   BMI Readings from Last 3 Encounters:  10/28/22 29.84 kg/m  10/10/22 32.28 kg/m  10/02/22 32.28 kg/m    Recent lab results    Component Value Date/Time   NA 128 (L) 10/29/2022 1142   K 3.4 (L)  10/29/2022 1142   CL 97 (L) 10/29/2022 1142   CO2 22 10/29/2022 1142   GLUCOSE 137 (H) 10/29/2022 1142   BUN 16 10/29/2022 1142   CREATININE 0.74 10/29/2022 1142   CREATININE 0.84 01/05/2020 1047   CALCIUM 8.7 (L) 10/29/2022 1142    Lab Results  Component Value Date   CREATININE 0.74 10/29/2022   GFR 89.74 06/08/2022   GFRNONAA >60 10/29/2022   GFRAA >60 01/21/2016   Lab Results  Component Value Date/Time   HGBA1C 5.5 05/28/2022 02:50 AM   HGBA1C 5.7 12/25/2021 02:41 PM    Lab Results  Component Value Date   CHOL 173 05/28/2022   HDL 59 05/28/2022   LDLCALC 92 05/28/2022   LDLDIRECT 157.1 02/16/2011   TRIG 111 05/28/2022   CHOLHDL 2.9 05/28/2022    Care Gaps: COVID Booster - Overdue Hepatitis C Screen AWV - 06/28/22  Pamala Duffel CMA Clinical Pharmacist Assistant [phone]

## 2022-11-14 DIAGNOSIS — B961 Klebsiella pneumoniae [K. pneumoniae] as the cause of diseases classified elsewhere: Secondary | ICD-10-CM | POA: Diagnosis not present

## 2022-11-14 DIAGNOSIS — Z9181 History of falling: Secondary | ICD-10-CM | POA: Diagnosis not present

## 2022-11-14 DIAGNOSIS — F32A Depression, unspecified: Secondary | ICD-10-CM | POA: Diagnosis not present

## 2022-11-14 DIAGNOSIS — I10 Essential (primary) hypertension: Secondary | ICD-10-CM | POA: Diagnosis not present

## 2022-11-14 DIAGNOSIS — Z7982 Long term (current) use of aspirin: Secondary | ICD-10-CM | POA: Diagnosis not present

## 2022-11-14 DIAGNOSIS — N320 Bladder-neck obstruction: Secondary | ICD-10-CM | POA: Diagnosis not present

## 2022-11-14 DIAGNOSIS — N401 Enlarged prostate with lower urinary tract symptoms: Secondary | ICD-10-CM | POA: Diagnosis not present

## 2022-11-14 DIAGNOSIS — F418 Other specified anxiety disorders: Secondary | ICD-10-CM | POA: Diagnosis not present

## 2022-11-14 DIAGNOSIS — Z466 Encounter for fitting and adjustment of urinary device: Secondary | ICD-10-CM | POA: Diagnosis not present

## 2022-11-14 DIAGNOSIS — Z8673 Personal history of transient ischemic attack (TIA), and cerebral infarction without residual deficits: Secondary | ICD-10-CM | POA: Diagnosis not present

## 2022-11-14 DIAGNOSIS — R338 Other retention of urine: Secondary | ICD-10-CM | POA: Diagnosis not present

## 2022-11-14 DIAGNOSIS — N3001 Acute cystitis with hematuria: Secondary | ICD-10-CM | POA: Diagnosis not present

## 2022-11-14 DIAGNOSIS — E538 Deficiency of other specified B group vitamins: Secondary | ICD-10-CM | POA: Diagnosis not present

## 2022-11-22 DIAGNOSIS — Z7982 Long term (current) use of aspirin: Secondary | ICD-10-CM | POA: Diagnosis not present

## 2022-11-22 DIAGNOSIS — F32A Depression, unspecified: Secondary | ICD-10-CM | POA: Diagnosis not present

## 2022-11-22 DIAGNOSIS — Z79899 Other long term (current) drug therapy: Secondary | ICD-10-CM | POA: Diagnosis not present

## 2022-11-22 DIAGNOSIS — I10 Essential (primary) hypertension: Secondary | ICD-10-CM | POA: Diagnosis not present

## 2022-11-22 DIAGNOSIS — F418 Other specified anxiety disorders: Secondary | ICD-10-CM | POA: Diagnosis not present

## 2022-11-22 DIAGNOSIS — R338 Other retention of urine: Secondary | ICD-10-CM | POA: Diagnosis not present

## 2022-11-22 DIAGNOSIS — Z466 Encounter for fitting and adjustment of urinary device: Secondary | ICD-10-CM | POA: Diagnosis not present

## 2022-11-22 DIAGNOSIS — N401 Enlarged prostate with lower urinary tract symptoms: Secondary | ICD-10-CM | POA: Diagnosis not present

## 2022-11-22 DIAGNOSIS — Z87440 Personal history of urinary (tract) infections: Secondary | ICD-10-CM | POA: Diagnosis not present

## 2022-11-22 DIAGNOSIS — E538 Deficiency of other specified B group vitamins: Secondary | ICD-10-CM | POA: Diagnosis not present

## 2022-11-26 DIAGNOSIS — M6281 Muscle weakness (generalized): Secondary | ICD-10-CM | POA: Diagnosis not present

## 2022-11-26 DIAGNOSIS — R262 Difficulty in walking, not elsewhere classified: Secondary | ICD-10-CM | POA: Diagnosis not present

## 2022-11-26 DIAGNOSIS — S82832S Other fracture of upper and lower end of left fibula, sequela: Secondary | ICD-10-CM | POA: Diagnosis not present

## 2022-11-26 DIAGNOSIS — R2681 Unsteadiness on feet: Secondary | ICD-10-CM | POA: Diagnosis not present

## 2022-11-26 DIAGNOSIS — Z9181 History of falling: Secondary | ICD-10-CM | POA: Diagnosis not present

## 2022-11-26 DIAGNOSIS — R296 Repeated falls: Secondary | ICD-10-CM | POA: Diagnosis not present

## 2022-11-27 ENCOUNTER — Encounter: Payer: Self-pay | Admitting: Family Medicine

## 2022-11-27 ENCOUNTER — Ambulatory Visit (INDEPENDENT_AMBULATORY_CARE_PROVIDER_SITE_OTHER): Payer: Medicare HMO | Admitting: Family Medicine

## 2022-11-27 VITALS — BP 140/64 | HR 106 | Temp 98.4°F | Wt 256.0 lb

## 2022-11-27 DIAGNOSIS — N390 Urinary tract infection, site not specified: Secondary | ICD-10-CM | POA: Diagnosis not present

## 2022-11-27 DIAGNOSIS — B356 Tinea cruris: Secondary | ICD-10-CM

## 2022-11-27 DIAGNOSIS — N400 Enlarged prostate without lower urinary tract symptoms: Secondary | ICD-10-CM

## 2022-11-27 DIAGNOSIS — Z978 Presence of other specified devices: Secondary | ICD-10-CM

## 2022-11-27 MED ORDER — KETOCONAZOLE 2 % EX CREA: 1.0000 | TOPICAL_CREAM | Freq: Three times a day (TID) | CUTANEOUS | 2 refills | Status: DC

## 2022-11-27 NOTE — Progress Notes (Signed)
   Subjective:    Patient ID: Calvin [name], male    DOB: 15-Oct-1945, 77 y.o.   [name]: [id number]  HPI Here to follow up on an ED visit on 10-28-22 for bloody urine. No fever or pain. He has an indwelling catheter and he has had recurrent UTI's. That day his bag was blocked by blood clots, and these were irrigated out. A urine culture grew Enterococcus faecalis and Proteus mirabilis. He was given IV Rocephin and a course of Keflex. Sine then the urine has been clear. He does complain of an irritated red rash on the scrotum and penis, as well as both groin areas.    Review of Systems  Constitutional:  Positive for fatigue. Negative for fever.  Respiratory:  Positive for shortness of breath.   Cardiovascular: Negative.   Gastrointestinal: Negative.   Genitourinary:  Negative for flank pain and hematuria.  Skin:  Positive for rash.       Objective:   Physical Exam Constitutional:      General: He is not in acute distress.    Comments: In a wheelchair   Cardiovascular:     Rate and Rhythm: Normal rate and regular rhythm.     Pulses: Normal pulses.     Heart sounds: Normal heart sounds.  Pulmonary:     Effort: Pulmonary effort is normal.     Breath sounds: Normal breath sounds.  Abdominal:     Tenderness: There is no right CVA tenderness or left CVA tenderness.  Skin:    Comments: The entire perineum has a macular red rash   Neurological:     Mental Status: He is alert.           Assessment & Plan:  For the BPH, he is now dependent on an indwelling catheter. He will see Urology tomorrow. The recent UTI seems to be resolved. He has tinea cruris, and we will treat this with Ketoconazole cream as needed. We spent a total of ( 32  ) minutes reviewing records and discussing these issues.  Gershon Crane, MD  Gershon Crane, MD

## 2022-11-28 DIAGNOSIS — R338 Other retention of urine: Secondary | ICD-10-CM | POA: Diagnosis not present

## 2022-11-28 DIAGNOSIS — N312 Flaccid neuropathic bladder, not elsewhere classified: Secondary | ICD-10-CM | POA: Diagnosis not present

## 2022-11-28 DIAGNOSIS — R310 Gross hematuria: Secondary | ICD-10-CM | POA: Diagnosis not present

## 2022-11-28 DIAGNOSIS — N401 Enlarged prostate with lower urinary tract symptoms: Secondary | ICD-10-CM | POA: Diagnosis not present

## 2022-11-30 ENCOUNTER — Other Ambulatory Visit: Payer: Self-pay | Admitting: Family Medicine

## 2022-12-04 MED ORDER — HYDROCHLOROTHIAZIDE 25 MG PO TABS: 25.0000 mg | ORAL_TABLET | Freq: Every day | ORAL | 0 refills | Status: DC

## 2022-12-04 MED ORDER — "BD ECLIPSE SYRINGE/NEEDLE 25G X 5/8"" 3 ML MISC": 0 refills | Status: DC

## 2022-12-04 MED ORDER — POTASSIUM CHLORIDE ER 10 MEQ PO TBCR: 10.0000 meq | EXTENDED_RELEASE_TABLET | Freq: Two times a day (BID) | ORAL | 0 refills | Status: DC

## 2022-12-13 ENCOUNTER — Other Ambulatory Visit: Payer: Self-pay | Admitting: Diagnostic Radiology

## 2022-12-14 ENCOUNTER — Encounter: Payer: Self-pay | Admitting: Family Medicine

## 2022-12-14 ENCOUNTER — Telehealth (INDEPENDENT_AMBULATORY_CARE_PROVIDER_SITE_OTHER): Payer: Medicare HMO | Admitting: Family Medicine

## 2022-12-14 ENCOUNTER — Other Ambulatory Visit: Payer: Self-pay | Admitting: Family Medicine

## 2022-12-14 DIAGNOSIS — J40 Bronchitis, not specified as acute or chronic: Secondary | ICD-10-CM

## 2022-12-14 MED ORDER — ASPIRIN 81 MG PO TBEC: 81.0000 mg | DELAYED_RELEASE_TABLET | Freq: Every day | ORAL | 3 refills | Status: DC

## 2022-12-14 MED ORDER — ONDANSETRON HCL 4 MG PO TABS: 4.0000 mg | ORAL_TABLET | Freq: Three times a day (TID) | ORAL | 5 refills | Status: AC | PRN

## 2022-12-14 MED ORDER — VITAMIN B-1 100 MG PO TABS: 100.0000 mg | ORAL_TABLET | Freq: Every day | ORAL | 3 refills | Status: AC

## 2022-12-14 MED ORDER — HYDROCHLOROTHIAZIDE 25 MG PO TABS: 25.0000 mg | ORAL_TABLET | Freq: Every day | ORAL | 3 refills | Status: DC

## 2022-12-14 MED ORDER — AZITHROMYCIN 250 MG PO TABS: ORAL_TABLET | ORAL | 0 refills | Status: DC

## 2022-12-14 MED ORDER — FOLIC ACID 1 MG PO TABS: 1.0000 mg | ORAL_TABLET | Freq: Every day | ORAL | 3 refills | Status: AC

## 2022-12-14 MED ORDER — HYDROCODONE BIT-HOMATROP MBR 5-1.5 MG/5ML PO SOLN: 5.0000 mL | ORAL | 0 refills | Status: DC | PRN

## 2022-12-14 MED ORDER — FUROSEMIDE 20 MG PO TABS: 20.0000 mg | ORAL_TABLET | Freq: Every day | ORAL | 3 refills | Status: AC

## 2022-12-14 MED ORDER — LOPERAMIDE HCL 2 MG PO CAPS: 2.0000 mg | ORAL_CAPSULE | Freq: Three times a day (TID) | ORAL | 5 refills | Status: DC | PRN

## 2022-12-14 MED ORDER — "BD ECLIPSE SYRINGE/NEEDLE 25G X 5/8"" 3 ML MISC": 3 refills | Status: AC

## 2022-12-14 MED ORDER — CYCLOBENZAPRINE HCL 10 MG PO TABS: 10.0000 mg | ORAL_TABLET | Freq: Three times a day (TID) | ORAL | 5 refills | Status: AC | PRN

## 2022-12-14 MED ORDER — KLOR-CON M10 10 MEQ PO TBCR: 10.0000 meq | EXTENDED_RELEASE_TABLET | Freq: Two times a day (BID) | ORAL | 3 refills | Status: DC

## 2022-12-14 NOTE — Progress Notes (Signed)
Subjective:    Patient ID: Calvin [name], male    DOB: 04-23-1946, 77 y.o.   [name]: [id number]  HPI Virtual Visit via Video Note  I connected with the patient on 12/14/22 at 11:00 AM EDT by a video enabled telemedicine application and verified that I am speaking with the correct person using two identifiers.  Location patient: home Location provider:work or home office Persons participating in the virtual visit: patient, provider  I discussed the limitations of evaluation and management by telemedicine and the availability of in person appointments. The patient expressed understanding and agreed to proceed.   HPI: Here for one week of PND, chest congestion, and a dry cough. No fever or SOB. Drinking fluids. He is scheduled for placement of a suprapubic catheter and for a cystoscopy on 12-25-22.   ROS: See pertinent positives and negatives per HPI.  Past Medical History:  Diagnosis Date   Benign prostatic hypertrophy    COVID-19 virus infection 02/24/2020   ED (erectile dysfunction)    Headache(784.0)    Hypertension    Hypogonadism male    Low back pain 06/09/2020   Nephrolithiasis    hx of had lithotripsy in 1-10.   NEPHROLITHIASIS, HX OF 10/25/2008   Qualifier: Diagnosis of   By: Clent Ridges MD, Tera Mater      Pyelonephritis 02/25/2022    Past Surgical History:  Procedure Laterality Date   colonoscopy  10/05/2020   per Dr. Russella Dar, adenomatous polyps, repeat in 3 yrs   FOOT SURGERY     LAPAROSCOPIC APPENDECTOMY  03/12/2012   Procedure: APPENDECTOMY LAPAROSCOPIC;  Surgeon: Shelly Rubenstein, MD;  Location: [facility] OR;  Service: General;  Laterality: N/A;   STAPLE HEMORRHOIDECTOMY  04/14/2010   per Dr. Michaell Cowing    TONSILLECTOMY      Family History  Problem Relation Age of Onset   Heart Problems Mother        Caused by a Virus when she was a teen   Stroke Father    Prostate cancer Father    Hypertension Other    Stroke Other    Heart disease Other        rheumatic    Coronary  artery disease Other    Healthy Child    Colon cancer Neg Hx    Colon polyps Neg Hx    Esophageal cancer Neg Hx    Rectal cancer Neg Hx    Stomach cancer Neg Hx      Current Outpatient Medications:    amLODipine (NORVASC) 5 MG tablet, TAKE 1 TABLET (5 MG TOTAL) BY MOUTH DAILY., Disp: 90 tablet, Rfl: 3   aspirin EC 81 MG tablet, Take 1 tablet (81 mg total) by mouth daily. Swallow whole., Disp: 30 tablet, Rfl: 12   atorvastatin (LIPITOR) 20 MG tablet, Take 20 mg by mouth daily., Disp: , Rfl:    cyanocobalamin (VITAMIN B12) 1000 MCG/ML injection, INJECT 1 ML (1,000 MCG TOTAL) INTO THE MUSCLE ONCE A WEEK., Disp: 12 mL, Rfl: 1   cyclobenzaprine (FLEXERIL) 10 MG tablet, Take 10 mg by mouth 3 (three) times daily as needed for muscle spasms., Disp: , Rfl:    finasteride (PROSCAR) 5 MG tablet, TAKE 1 TABLET (5 MG TOTAL) BY MOUTH DAILY., Disp: 90 tablet, Rfl: 1   fluticasone (FLONASE) 50 MCG/ACT nasal spray, Place 1 spray into both nostrils daily., Disp: 16 g, Rfl: 11   folic acid (FOLVITE) 1 MG tablet, Take 1 tablet (1 mg total) by mouth daily., Disp: 30  tablet, Rfl: 0   furosemide (LASIX) 20 MG tablet, Take 1 tablet (20 mg total) by mouth daily., Disp: 3 tablet, Rfl: 0   hydrochlorothiazide (HYDRODIURIL) 25 MG tablet, Take 1 tablet (25 mg total) by mouth daily., Disp: 90 tablet, Rfl: 0   ketoconazole (NIZORAL) 2 % cream, Apply 1 Application topically 3 (three) times daily., Disp: 60 g, Rfl: 2   KLOR-CON M10 10 MEQ tablet, Take 10 mEq by mouth 2 (two) times daily., Disp: , Rfl:    loperamide (IMODIUM) 2 MG capsule, Take 1 capsule (2 mg total) by mouth every 8 (eight) hours as needed for diarrhea or loose stools., Disp: 30 capsule, Rfl: 0   losartan (COZAAR) 100 MG tablet, TAKE 1 TABLET BY MOUTH EVERY DAY, Disp: 90 tablet, Rfl: 3   Multiple Vitamin (MULTIVITAMIN WITH MINERALS) TABS tablet, Take 1 tablet by mouth daily., Disp: 30 tablet, Rfl: 0   ondansetron (ZOFRAN) 4 MG tablet, Take 4 mg by mouth  every 8 (eight) hours as needed for nausea or vomiting., Disp: , Rfl:    potassium chloride (KLOR-CON) 10 MEQ tablet, Take 1 tablet (10 mEq total) by mouth 2 (two) times daily., Disp: 180 tablet, Rfl: 0   sertraline (ZOLOFT) 100 MG tablet, TAKE 1 TABLET BY MOUTH EVERY DAY, Disp: 90 tablet, Rfl: 0   SYRINGE-NEEDLE, DISP, 3 ML (BD ECLIPSE SYRINGE/NEEDLE) 25G X 5/8" 3 ML MISC, Use as directed once a week, Disp: 12 each, Rfl: 0   tamsulosin (FLOMAX) 0.4 MG CAPS capsule, TAKE 1 CAPSULE BY MOUTH EVERY DAY, Disp: 90 capsule, Rfl: 3   thiamine (VITAMIN B-1) 100 MG tablet, Take 1 tablet (100 mg total) by mouth daily., Disp: 30 tablet, Rfl: 0  EXAM:  VITALS per patient if applicable:  GENERAL: alert, oriented, appears well and in no acute distress  HEENT: atraumatic, conjunttiva clear, no obvious abnormalities on inspection of external nose and ears  NECK: normal movements of the head and neck  LUNGS: on inspection no signs of respiratory distress, breathing rate appears normal, no obvious gross SOB, gasping or wheezing  CV: no obvious cyanosis  MS: moves all visible extremities without noticeable abnormality  PSYCH/NEURO: pleasant and cooperative, no obvious depression or anxiety, speech and thought processing grossly intact  ASSESSMENT AND PLAN: Bronchitis, treat with a Zpack. Gershon Crane, MD   Discussed the following assessment and plan:  No diagnosis found.     I discussed the assessment and treatment plan with the patient. The patient was provided an opportunity to ask questions and all were answered. The patient agreed with the plan and demonstrated an understanding of the instructions.   The patient was advised to call back or seek an in-person evaluation if the symptoms worsen or if the condition fails to improve as anticipated.      Review of Systems     Objective:   Physical Exam        Assessment & Plan:

## 2022-12-18 ENCOUNTER — Encounter (HOSPITAL_COMMUNITY): Payer: Self-pay

## 2022-12-18 DIAGNOSIS — N401 Enlarged prostate with lower urinary tract symptoms: Secondary | ICD-10-CM | POA: Diagnosis not present

## 2022-12-18 DIAGNOSIS — R338 Other retention of urine: Secondary | ICD-10-CM | POA: Diagnosis not present

## 2022-12-18 DIAGNOSIS — Z9181 History of falling: Secondary | ICD-10-CM | POA: Diagnosis not present

## 2022-12-18 DIAGNOSIS — F32A Depression, unspecified: Secondary | ICD-10-CM | POA: Diagnosis not present

## 2022-12-18 DIAGNOSIS — Z87440 Personal history of urinary (tract) infections: Secondary | ICD-10-CM | POA: Diagnosis not present

## 2022-12-18 DIAGNOSIS — I10 Essential (primary) hypertension: Secondary | ICD-10-CM | POA: Diagnosis not present

## 2022-12-18 DIAGNOSIS — E538 Deficiency of other specified B group vitamins: Secondary | ICD-10-CM | POA: Diagnosis not present

## 2022-12-18 DIAGNOSIS — Z466 Encounter for fitting and adjustment of urinary device: Secondary | ICD-10-CM | POA: Diagnosis not present

## 2022-12-18 DIAGNOSIS — F418 Other specified anxiety disorders: Secondary | ICD-10-CM | POA: Diagnosis not present

## 2022-12-18 DIAGNOSIS — Z79899 Other long term (current) drug therapy: Secondary | ICD-10-CM | POA: Diagnosis not present

## 2022-12-18 DIAGNOSIS — Z8673 Personal history of transient ischemic attack (TIA), and cerebral infarction without residual deficits: Secondary | ICD-10-CM | POA: Diagnosis not present

## 2022-12-18 DIAGNOSIS — Z8616 Personal history of COVID-19: Secondary | ICD-10-CM | POA: Diagnosis not present

## 2022-12-18 DIAGNOSIS — Z7982 Long term (current) use of aspirin: Secondary | ICD-10-CM | POA: Diagnosis not present

## 2022-12-18 NOTE — Progress Notes (Addendum)
PCP - Larey Dresser, MD LOV 12-14-22 Cardiologist - LOV 10-02-22 Lance Muss, MD Pulm-LOV 10-10-22 epic Dr. Jonelle Sidle  PPM/ICD -  Device Orders -  Rep Notified -   Chest x-ray - CTA chest 08-09-22 epic EKG - 10-02-22 epic Stress Test -  ECHO - 05-28-22 epic Cardiac Cath -   Sleep Study -  CPAP -   Fasting Blood Sugar -  Checks Blood Sugar _____ times a day  Blood Thinner Instructions: Aspirin Instructions:  ERAS Protcol -N/A PRE-SURGERY Ensure or G2-    COVID vaccine -  Activity--Uses walker and WC No CP some SOB not new for pt. Anesthesia review: HTN  Patient denies shortness of breath, fever, cough and chest pain at PAT appointment   All instructions explained to the patient, with a verbal understanding of the material. Patient agrees to go over the instructions while at home for a better understanding. Patient also instructed to self quarantine after being tested for COVID-19. The opportunity to ask questions was provided.

## 2022-12-18 NOTE — Patient Instructions (Signed)
SURGICAL WAITING ROOM VISITATION  Patients having surgery or a procedure may have no more than 2 support people in the waiting area - these visitors may rotate.    Children under the age of 4 must have an adult with them who is not the patient.   If the patient needs to stay at the hospital during part of their recovery, the visitor guidelines for inpatient rooms apply. Pre-op nurse will coordinate an appropriate time for 1 support person to accompany patient in pre-op.  This support person may not rotate.    Please refer to the [facility] website for the visitor guidelines for Inpatients (after your surgery is over and you are in a regular room).       Your procedure is scheduled on: 12-25-22   Report to [facility] Main Entrance    Report to admitting at      0930 AM   Call this number if you have problems the morning of surgery [phone]   Do not eat food  or drink liquids  :After Midnight. Except sips of water with meds                 If you have questions, please contact your surgeon's office.   FOLLOW ANY ADDITIONAL PRE OP INSTRUCTIONS YOU RECEIVED FROM YOUR SURGEON'S OFFICE!!!     Oral Hygiene is also important to reduce your risk of infection.                                    Remember - BRUSH YOUR TEETH THE MORNING OF SURGERY WITH YOUR REGULAR TOOTHPASTE  DENTURES WILL BE REMOVED PRIOR TO SURGERY PLEASE DO NOT APPLY "Poly grip" OR ADHESIVES!!!   Do NOT smoke after Midnight   Take these medicines the morning of surgery with A SIP OF WATER: tamsulosin, sertraline, finasteride, atorvastatin, amlodipine, flonase                               You may not have any metal on your body including hair pins, jewelry, and body piercing             Do not wear  lotions, powders, perfumes/cologne, or deodorant                Men may shave face and neck.   Do not bring valuables to the hospital. [facility] IS NOT             RESPONSIBLE   FOR  VALUABLES.   Contacts, glasses, dentures or bridgework may not be worn into surgery.     DO NOT BRING YOUR HOME MEDICATIONS TO THE HOSPITAL. PHARMACY WILL DISPENSE MEDICATIONS LISTED ON YOUR MEDICATION LIST TO YOU DURING YOUR ADMISSION IN THE HOSPITAL!    Patients discharged on the day of surgery will not be allowed to drive home.  Someone NEEDS to stay with you for the first 24 hours after anesthesia.   Special Instructions: Bring a copy of your healthcare power of attorney and living will documents the day of surgery if you haven't scanned them before.              Please read over the following fact sheets you were given: IF YOU HAVE QUESTIONS ABOUT YOUR PRE-OP INSTRUCTIONS PLEASE CALL [phone]   If you test positive for Covid or have been in  contact with anyone that has tested positive in the last 10 days please notify you surgeon.    [facility] - Preparing for Surgery Before surgery, you can play an important role.  Because skin is not sterile, your skin needs to be as free of germs as possible.  You can reduce the number of germs on your skin by washing with CHG (chlorahexidine gluconate) soap before surgery.  CHG is an antiseptic cleaner which kills germs and bonds with the skin to continue killing germs even after washing. Please DO NOT use if you have an allergy to CHG or antibacterial soaps.  If your skin becomes reddened/irritated stop using the CHG and inform your nurse when you arrive at Short Stay. Do not shave (including legs and underarms) for at least 48 hours prior to the first CHG shower.  You may shave your face/neck. Please follow these instructions carefully:  1.  Shower with CHG Soap the night before surgery and the  morning of Surgery.  2.  If you choose to wash your hair, wash your hair first as usual with your  normal  shampoo.  3.  After you shampoo, rinse your hair and body thoroughly to remove the  shampoo.                           4.  Use CHG as you would  any other liquid soap.  You can apply chg directly  to the skin and wash                       Gently with a scrungie or clean washcloth.  5.  Apply the CHG Soap to your body ONLY FROM THE NECK DOWN.   Do not use on face/ open                           Wound or open sores. Avoid contact with eyes, ears mouth and genitals (private parts).                       Wash face,  Genitals (private parts) with your normal soap.             6.  Wash thoroughly, paying special attention to the area where your surgery  will be performed.  7.  Thoroughly rinse your body with warm water from the neck down.  8.  DO NOT shower/wash with your normal soap after using and rinsing off  the CHG Soap.                9.  Pat yourself dry with a clean towel.            10.  Wear clean pajamas.            11.  Place clean sheets on your bed the night of your first shower and do not  sleep with pets. Day of Surgery : Do not apply any lotions/deodorants the morning of surgery.  Please wear clean clothes to the hospital/surgery center.  FAILURE TO FOLLOW THESE INSTRUCTIONS MAY RESULT IN THE CANCELLATION OF YOUR SURGERY PATIENT SIGNATURE_________________________________  NURSE SIGNATURE__________________________________  ________________________________________________________________________

## 2022-12-19 ENCOUNTER — Telehealth: Payer: Self-pay | Admitting: Interventional Cardiology

## 2022-12-19 ENCOUNTER — Encounter (HOSPITAL_COMMUNITY): Payer: Self-pay

## 2022-12-19 ENCOUNTER — Other Ambulatory Visit: Payer: Self-pay

## 2022-12-19 ENCOUNTER — Encounter (HOSPITAL_COMMUNITY)
Admission: RE | Admit: 2022-12-19 | Discharge: 2022-12-19 | Disposition: A | Discharge status: Home / Self Care | Payer: Medicare HMO | Source: Ambulatory Visit | Attending: Urology | Admitting: Urology

## 2022-12-19 VITALS — BP 126/86 | HR 87 | Temp 98.1°F | Resp 16 | Ht 72.0 in | Wt 250.0 lb

## 2022-12-19 DIAGNOSIS — D3501 Benign neoplasm of right adrenal gland: Secondary | ICD-10-CM | POA: Diagnosis not present

## 2022-12-19 DIAGNOSIS — Z8673 Personal history of transient ischemic attack (TIA), and cerebral infarction without residual deficits: Secondary | ICD-10-CM | POA: Insufficient documentation

## 2022-12-19 DIAGNOSIS — R338 Other retention of urine: Secondary | ICD-10-CM | POA: Diagnosis not present

## 2022-12-19 DIAGNOSIS — F419 Anxiety disorder, unspecified: Secondary | ICD-10-CM | POA: Insufficient documentation

## 2022-12-19 DIAGNOSIS — Z01818 Encounter for other preprocedural examination: Secondary | ICD-10-CM | POA: Diagnosis not present

## 2022-12-19 DIAGNOSIS — I160 Hypertensive urgency: Secondary | ICD-10-CM

## 2022-12-19 DIAGNOSIS — R911 Solitary pulmonary nodule: Secondary | ICD-10-CM | POA: Insufficient documentation

## 2022-12-19 DIAGNOSIS — I3139 Other pericardial effusion (noninflammatory): Secondary | ICD-10-CM | POA: Insufficient documentation

## 2022-12-19 DIAGNOSIS — M8580 Other specified disorders of bone density and structure, unspecified site: Secondary | ICD-10-CM | POA: Diagnosis not present

## 2022-12-19 DIAGNOSIS — R310 Gross hematuria: Secondary | ICD-10-CM | POA: Diagnosis not present

## 2022-12-19 DIAGNOSIS — Z8616 Personal history of COVID-19: Secondary | ICD-10-CM | POA: Insufficient documentation

## 2022-12-19 DIAGNOSIS — I2510 Atherosclerotic heart disease of native coronary artery without angina pectoris: Secondary | ICD-10-CM | POA: Diagnosis not present

## 2022-12-19 DIAGNOSIS — F32A Depression, unspecified: Secondary | ICD-10-CM | POA: Insufficient documentation

## 2022-12-19 DIAGNOSIS — I10 Essential (primary) hypertension: Secondary | ICD-10-CM

## 2022-12-19 DIAGNOSIS — I712 Thoracic aortic aneurysm, without rupture, unspecified: Secondary | ICD-10-CM | POA: Diagnosis not present

## 2022-12-19 DIAGNOSIS — N401 Enlarged prostate with lower urinary tract symptoms: Secondary | ICD-10-CM | POA: Diagnosis not present

## 2022-12-19 DIAGNOSIS — R222 Localized swelling, mass and lump, trunk: Secondary | ICD-10-CM | POA: Diagnosis not present

## 2022-12-19 DIAGNOSIS — J40 Bronchitis, not specified as acute or chronic: Secondary | ICD-10-CM | POA: Diagnosis not present

## 2022-12-19 HISTORY — DX: Anxiety disorder, unspecified: F41.9

## 2022-12-19 HISTORY — DX: Personal history of urinary calculi: Z87.442

## 2022-12-19 HISTORY — DX: Cerebral infarction, unspecified: I63.9

## 2022-12-19 HISTORY — DX: Depression, unspecified: F32.A

## 2022-12-19 HISTORY — DX: Myoneural disorder, unspecified: G70.9

## 2022-12-19 HISTORY — DX: Pneumonia, unspecified organism: J18.9

## 2022-12-19 LAB — BASIC METABOLIC PANEL
Anion gap: 10 (ref 5–15)
BUN: 16 mg/dL (ref 8–23)
CO2: 26 mmol/L (ref 22–32)
Calcium: 9.4 mg/dL (ref 8.9–10.3)
Chloride: 100 mmol/L (ref 98–111)
Creatinine, Ser: 0.8 mg/dL (ref 0.61–1.24)
GFR, Estimated: >60 mL/min (ref >60)
Glucose, Bld: 107 mg/dL — ABNORMAL HIGH (ref 70–99)
Potassium: 4.1 mmol/L (ref 3.5–5.1)
Sodium: 136 mmol/L (ref 135–145)

## 2022-12-19 LAB — CBC
HCT: 43.4 % (ref 39.0–52.0)
Hemoglobin: 15.2 g/dL (ref 13.0–17.0)
MCH: 34.7 pg — ABNORMAL HIGH (ref 26.0–34.0)
MCHC: 35 g/dL (ref 30.0–36.0)
MCV: 99.1 fL (ref 80.0–100.0)
Platelets: 290 10*3/uL (ref 150–400)
RBC: 4.38 MIL/uL (ref 4.22–5.81)
RDW: 12.5 % (ref 11.5–15.5)
WBC: 9.5 10*3/uL (ref 4.0–10.5)
nRBC: 0 % (ref 0.0–0.2)

## 2022-12-19 NOTE — Telephone Encounter (Signed)
   Pre-operative Risk Assessment    Patient Name: Calvin [name]  DOB: Sep 02, 1945 [name]: [id number]      Request for Surgical Clearance    Procedure:   Cysto and Suprapubic Tube Placemant  Date of Surgery:  Clearance 12/25/22                                 Surgeon:  Dr. Estrellita Ludwig Surgeon's Group or Practice Name:  [facility] Phone number:  [phone] Fax number:  [phone]   Type of Clearance Requested:   - Pharmacy:  Hold Aspirin Hold 5 days Prior   Type of Anesthesia:  General    Additional requests/questions:  Please advise surgeon/provider what medications should be held.  Signed, Belisicia T Woods   12/19/2022, 3:51 PM

## 2022-12-19 NOTE — Telephone Encounter (Signed)
Left a message to call back for tele pre op appt. Per pre op APP Tamsen Roers. NP states to add pt to Friday 12/21/22 @ 3:20. I will add pt onto schedule and follow back on Friday 7/5 AM with the pt to confirm appt.

## 2022-12-19 NOTE — Telephone Encounter (Signed)
   Name: Calvin [name]  DOB: [birth year]  [name]: [id number]  Primary Cardiologist: Lance Muss, MD   Preoperative team, please contact this patient and set up a phone call appointment for further preoperative risk assessment. Please obtain consent and complete medication review. Thank you for your help.  I confirm that guidance regarding antiplatelet and oral anticoagulation therapy has been completed and, if necessary, noted below.  Aspirin prescribed by a noncardiology provider.  Recommendation for holding Aspirin deferred to prescribing provider.     Carlos Levering, NP 12/19/2022, 4:56 PM [facility]

## 2022-12-21 ENCOUNTER — Telehealth: Payer: Self-pay | Admitting: Family Medicine

## 2022-12-21 ENCOUNTER — Telehealth: Payer: Self-pay | Admitting: *Deleted

## 2022-12-21 ENCOUNTER — Other Ambulatory Visit: Payer: Self-pay

## 2022-12-21 ENCOUNTER — Ambulatory Visit: Payer: Medicare HMO | Attending: Cardiovascular Disease | Admitting: Student

## 2022-12-21 DIAGNOSIS — Z01810 Encounter for preprocedural cardiovascular examination: Secondary | ICD-10-CM

## 2022-12-21 NOTE — Telephone Encounter (Signed)
Spoke with pt Dr Erie Noe reviewed BP medications and advised of a duplicate of the Klor-Con 10 meq. Verbalized understanding

## 2022-12-21 NOTE — Telephone Encounter (Addendum)
Pt daughter has a ? Pt has 2 different potassium chloride (KLOR-CON) 10 MEQ tablet and potassium chloride tablet crys er  on med list also she would like someone to call her back concerning  these medications amLODipine (NORVASC) 5 MG tablet ,  furosemide (LASIX) 20 MG tablet and hydrochlorothiazide (HYDRODIURIL) 25 MG tablet

## 2022-12-21 NOTE — Progress Notes (Signed)
Virtual Visit via Telephone Note   Because of Calvin Calvin [name]'s co-morbid illnesses, he is at least at moderate risk for complications without adequate follow up.  This format is felt to be most appropriate for this patient at this time.  The patient did not have access to video technology/had technical difficulties with video requiring transitioning to audio format only (telephone).  All issues noted in this document were discussed and addressed.  No physical exam could be performed with this format.  Please refer to the patient's chart for his consent to telehealth for Calvin Calvin [name].  Evaluation Performed:  Preoperative cardiovascular risk assessment _____________   Date:  12/21/2022   Patient ID:  Calvin Calvin [name], DOB [birth year], [name] [id number] Patient Location:  Home Provider location:   Office  Primary Care Provider:  [name], MD Primary Cardiologist:  Lance Muss, MD  Chief Complaint / Patient Profile   77 y.o. y/o male with a h/o thoracic aortic aneurysm, hypertension, CVA who is pending cystoscopy and suprapubic tube placement by Dr. Arita Miss and presents today for telephonic preoperative cardiovascular risk assessment.  History of Present Illness    Calvin Calvin [name] is a 77 y.o. male who presents via audio/video conferencing for a telehealth visit today.  Pt was last seen in cardiology clinic on 10/02/2022 by Dr. Eldridge Dace.  At that time Calvin Calvin [name] was doing well.  The patient is now pending procedure as outlined above. Since his last visit, he is doing well from a cardiac standpoint. Patient denies shortness of breath Calvin [name] dyspnea on exertion. No chest pain, pressure, Calvin [name] tightness. Denies lower extremity edema, orthopnea, Calvin [name] PND. No palpitations. Activity has been very limited since he began having urological issues several months ago. Prior to that he was going to the gym 3-4 times a week. He now has to utilize a walker Calvin [name] wheelchair to get around. He is independent  with ADLs and can do short walks as long as he has his walker.     Past Medical History    Past Medical History:  Diagnosis Date   Anxiety    Benign prostatic hypertrophy    COVID-19 virus infection 02/24/2020   Depression    ED (erectile dysfunction)    Headache(784.0)    History of kidney stones    Hypertension    Hypogonadism male    Low back pain 06/09/2020   Nephrolithiasis    hx of had lithotripsy in 1-10.   NEPHROLITHIASIS, HX OF 10/25/2008   Qualifier: Diagnosis of   By: Clent Ridges MD, Tera Mater      Neuromuscular disorder Calvin Calvin [name])    neuropaty legs   Pneumonia    Pyelonephritis 02/25/2022   Stroke Calvin Calvin [name])    fall 2023 some memory issues like time of day   Past Surgical History:  Procedure Laterality Date   colonoscopy  10/05/2020   per Dr. Russella Dar, adenomatous polyps, repeat in 3 yrs   FOOT SURGERY     LAPAROSCOPIC APPENDECTOMY  03/12/2012   Procedure: APPENDECTOMY LAPAROSCOPIC;  Surgeon: Shelly Rubenstein, MD;  Location: Calvin [name];  Service: General;  Laterality: N/A;   STAPLE HEMORRHOIDECTOMY  04/14/2010   per Dr. Michaell Cowing    TONSILLECTOMY      Allergies  No Known Allergies  Home Medications    Prior to Admission medications   Medication Sig Start Date End Date Taking? Authorizing Provider  amLODipine (NORVASC) 5 MG tablet TAKE 1 TABLET (5 MG TOTAL) BY MOUTH DAILY. 05/03/22  [name], MD  aspirin EC 81 MG tablet Take 1 tablet (81 mg total) by mouth daily. Swallow whole. Patient not taking: Reported on 12/19/2022 12/14/22   [name], MD  atorvastatin (LIPITOR) 20 MG tablet Take 20 mg by mouth daily. 07/30/22   [provider]  azithromycin (ZITHROMAX Z-PAK) 250 MG tablet As directed Patient taking differently: Take by mouth daily. As directed Taper 12/14/22   [name], MD  cyanocobalamin (VITAMIN B12) 1000 MCG/ML injection INJECT 1 ML (1,000 MCG TOTAL) INTO THE MUSCLE ONCE A WEEK. 03/13/22   [name], MD  cyclobenzaprine (FLEXERIL) 10 MG tablet  Take 1 tablet (10 mg total) by mouth 3 (three) times daily as needed for muscle spasms. 12/14/22   [name], MD  finasteride (PROSCAR) 5 MG tablet TAKE 1 TABLET (5 MG TOTAL) BY MOUTH DAILY. 10/01/22   [name], MD  fluticasone (FLONASE) 50 MCG/ACT nasal spray Place 1 spray into both nostrils daily. 10/10/22   Omar Person, MD  folic acid (FOLVITE) 1 MG tablet Take 1 tablet (1 mg total) by mouth daily. 12/14/22   [name], MD  furosemide (LASIX) 20 MG tablet Take 1 tablet (20 mg total) by mouth daily. 12/14/22   [name], MD  hydrochlorothiazide (HYDRODIURIL) 25 MG tablet Take 1 tablet (25 mg total) by mouth daily. 12/14/22   [name], MD  HYDROcodone bit-homatropine (HYCODAN) 5-1.5 MG/5ML syrup Take 5 mLs by mouth every 4 (four) hours as needed. Patient taking differently: Take 5 mLs by mouth every 4 (four) hours as needed for cough. 12/14/22   [name], MD  ketoconazole (NIZORAL) 2 % cream Apply 1 Application topically 3 (three) times daily. 11/27/22   [name], MD  KLOR-CON M10 10 MEQ tablet Take 1 tablet (10 mEq total) by mouth 2 (two) times daily. 12/14/22   [name], MD  loperamide (IMODIUM) 2 MG capsule Take 1 capsule (2 mg total) by mouth every 8 (eight) hours as needed for diarrhea Calvin [name] loose stools. 12/14/22   [name], MD  losartan (COZAAR) 100 MG tablet TAKE 1 TABLET BY MOUTH EVERY DAY 05/03/22   [name], MD  Multiple Vitamin (MULTIVITAMIN WITH MINERALS) TABS tablet Take 1 tablet by mouth daily. 06/01/22   Marguerita Merles Latif, DO  ondansetron (ZOFRAN) 4 MG tablet Take 1 tablet (4 mg total) by mouth every 8 (eight) hours as needed for nausea Calvin [name] vomiting. 12/14/22   [name], MD  potassium chloride (KLOR-CON) 10 MEQ tablet Take 1 tablet (10 mEq total) by mouth 2 (two) times daily. 12/04/22   [name], MD  sertraline (ZOLOFT) 100 MG tablet TAKE 1 TABLET BY MOUTH EVERY DAY 10/01/22   [name], MD  SYRINGE-NEEDLE, DISP, 3 ML  (BD ECLIPSE SYRINGE/NEEDLE) 25G X 5/8" 3 ML MISC Use as directed once a week 12/14/22   [name], MD  tamsulosin (FLOMAX) 0.4 MG CAPS capsule TAKE 1 CAPSULE BY MOUTH EVERY DAY 12/14/22   [name], MD  thiamine (VITAMIN B-1) 100 MG tablet Take 1 tablet (100 mg total) by mouth daily. 12/14/22   [name], MD    Physical Exam    Vital Signs:  Weldon Picking does not have vital signs available for review today.  Given telephonic nature of communication, physical exam is limited. AAOx3. NAD. Normal affect.  Speech and respirations are unlabored.  Accessory Clinical  Findings    None  Assessment & Plan    Primary Cardiologist: Lance Muss, MD  Preoperative cardiovascular risk assessment. Cystoscopy and suprapubic tube placement by Dr. Arita Miss.  Chart reviewed as part of pre-operative protocol coverage. According to the RCRI, patient has a 0.9% risk of MACE. Patient reports activity equivalent to 4.31 METS (per DASI).   Given past medical history and time since last visit, based on ACC/AHA guidelines, DAYLON ROHLFS would be at acceptable risk for the planned procedure without further cardiovascular testing.   Patient was advised that if he develops new symptoms prior to surgery to contact our office to arrange a follow-up appointment.  he verbalized understanding.  Ideally aspirin should be continued without interruption, however if the bleeding risk is too great, aspirin may be held for 5-7 days prior to surgery. Please resume aspirin post operatively when it is felt to be safe from a bleeding standpoint.    I will route this recommendation to the requesting party via Epic fax function.  Please call with questions.  Time:   Today, I have spent 5 minutes with the patient with telehealth technology discussing medical history, symptoms, and management plan.     Carlos Levering, NP  12/21/2022, 10:14 AM

## 2022-12-21 NOTE — Telephone Encounter (Signed)
I s/w DPR pt's daughter Erie Noe who states she just s/w someone from our office and said appt has been made. I apologized that the operator did not let me know she had called. I did however need to s/w her in order to complete notes for pre op. Med rec and consent are done. Erie Noe is aware the call for the pt is at 3:20. Erie Noe thanked me for the call today.

## 2022-12-21 NOTE — Telephone Encounter (Signed)
I s/w DPR pt's daughter Calvin [name] who states she just s/w someone from our office and said appt has been made. I apologized that the operator did not let me know she had called. I did however need to s/w her in order to complete notes for pre op. Med rec and consent are done. Calvin [name] is aware the call for the pt is at 3:20. Calvin [name] thanked me for the call today.      Patient Consent for Virtual Visit        Calvin [name] has provided verbal consent on 12/21/2022 for a virtual visit (video or telephone).   CONSENT FOR VIRTUAL VISIT FOR:  Calvin [name]  By participating in this virtual visit I agree to the following:  I hereby voluntarily request, consent and authorize [facility] and its employed or contracted physicians, physician assistants, nurse practitioners or other licensed health care professionals (the Practitioner), to provide me with telemedicine health care services (the "Services") as deemed necessary by the treating Practitioner. I acknowledge and consent to receive the Services by the Practitioner via telemedicine. I understand that the telemedicine visit will involve communicating with the Practitioner through live audiovisual communication technology and the disclosure of certain medical information by electronic transmission. I acknowledge that I have been given the opportunity to request an in-person assessment or other available alternative prior to the telemedicine visit and am voluntarily participating in the telemedicine visit.  I understand that I have the right to withhold or withdraw my consent to the use of telemedicine in the course of my care at any time, without affecting my right to future care or treatment, and that the Practitioner or I may terminate the telemedicine visit at any time. I understand that I have the right to inspect all information obtained and/or recorded in the course of the telemedicine visit and may receive copies of available information  for a reasonable fee.  I understand that some of the potential risks of receiving the Services via telemedicine include:  Delay or interruption in medical evaluation due to technological equipment failure or disruption; Information transmitted may not be sufficient (e.g. poor resolution of images) to allow for appropriate medical decision making by the Practitioner; and/or  In rare instances, security protocols could fail, causing a breach of personal health information.  Furthermore, I acknowledge that it is my responsibility to provide information about my medical history, conditions and care that is complete and accurate to the best of my ability. I acknowledge that Practitioner's advice, recommendations, and/or decision may be based on factors not within their control, such as incomplete or inaccurate data provided by me or distortions of diagnostic images or specimens that may result from electronic transmissions. I understand that the practice of medicine is not an exact science and that Practitioner makes no warranties or guarantees regarding treatment outcomes. I acknowledge that a copy of this consent can be made available to me via my patient portal [facility] MyChart), or I can request a printed copy by calling the office of [facility].    I understand that my insurance will be billed for this visit.   I have read or had this consent read to me. I understand the contents of this consent, which adequately explains the benefits and risks of the Services being provided via telemedicine.  I have been provided ample opportunity to ask questions regarding this consent and the Services and have had my questions answered to my satisfaction. I give my  informed consent for the services to be provided through the use of telemedicine in my medical care

## 2022-12-24 NOTE — Progress Notes (Signed)
Anesthesia Chart Review   Case: [id number] Date/Time: 12/25/22 1115   Procedures:      INSERTION OF SUPRAPUBIC CATHETER     CYSTOSCOPY WITH  BILATERAL RETROGRADE PYELOGRAM (Bilateral) - 90 MINS FRO CASE   Anesthesia type: General   Pre-op diagnosis: URINARY RETENTION GROSS HEMATURIA   Location: [facility] / [facility]   Surgeons: Noel Christmas, MD       DISCUSSION:77 y.o. never smoker with h/o HTN, stroke, BPH, urinary retention, gross hematuria scheduled for above procedure 12/25/2022 with Dr. Kasandra Knudsen.   Pt last seen by cardiology 10/02/2022. Per OV note, "Coronary calcification/Aortic atherosclerosis: No symptoms.  Continue statin.  Thoracic aortic aneurysm of small to moderate size 4.2 cm noted on prior CT.  Blood pressure well-controlled.  He is not doing any heavy strenuous work.  Bigger concern is his overall conditioning level.  He is not a candidate for elective aneurysm repair surgery even if it was to enlarge at this time.  Could consider further follow-up imaging depending on his clinical status in a year."   Pt seen by cardiology 12/21/2022 for preoperative evaluation. Per OV note, "Preoperative cardiovascular risk assessment. Cystoscopy and suprapubic tube placement by Dr. Arita Miss. Chart reviewed as part of pre-operative protocol coverage. According to the RCRI, patient has a 0.9% risk of MACE. Patient reports activity equivalent to 4.31 METS (per DASI).  Given past medical history and time since last visit, based on ACC/AHA guidelines, Calvin [name] would be at acceptable risk for the planned procedure without further cardiovascular testing.  Patient was advised that if he develops new symptoms prior to surgery to contact our office to arrange a follow-up appointment.  he verbalized understanding. Ideally aspirin should be continued without interruption, however if the bleeding risk is too great, aspirin may be held for 5-7 days prior to surgery. Please resume aspirin post  operatively when it is felt to be safe from a bleeding standpoint."  Anticipate pt can proceed with planned procedure barring acute status change.   VS: BP 126/86   Pulse 87   Temp 36.7 C (Oral)   Resp 16   Ht 6' (1.829 m)   Wt 113.4 kg   SpO2 98%   BMI 33.91 kg/m   PROVIDERS: [name], MD is PCP   Primary Cardiologist:  Lance Muss, MD  LABS: Labs reviewed: Acceptable for surgery. (all labs ordered are listed, but only abnormal results are displayed)  Labs Reviewed  BASIC METABOLIC PANEL - Abnormal; Notable for the following components:      Result Value   Glucose, Bld 107 (*)    All other components within normal limits  CBC - Abnormal; Notable for the following components:   MCH 34.7 (*)    All other components within normal limits     IMAGES: CT Angio 08/09/22 IMPRESSION: 1. No evidence of arterial dilatation or embolus. 2. Cardiomegaly with small pericardial effusion. 3. Aortic and coronary artery atherosclerosis. 4. Aneurysmal ectasia of the aortic root and ascending segment, both of which measure 4.2 cm. No dissection or stenosis. Recommend annual imaging followup by CTA or MRA. This recommendation follows 2010 ACCF/AHA/AATS/ACR/ASA/SCA/SCAI/SIR/STS/SVM Guidelines for the Diagnosis and Management of Patients with Thoracic Aortic Disease. Circulation. 2010; 121: A540-J811. Aortic aneurysm NOS (ICD10-I71.9). 5. Bronchitis without visible bronchial plugging. 6. Stable 7 mm right lower lobe nodule dating back to 2017 abdomen pelvis CT, at this point can be assumed benign. 7. Stable 2.3 cm right adrenal adenoma. 8. Osteopenia and  degenerative change.  EKG:   CV: Echo 05/28/2022 1. Left ventricular ejection fraction, by estimation, is 60 to 65%. The  left ventricle has normal function. The left ventricle has no regional  wall motion abnormalities. There is mild concentric left ventricular  hypertrophy. Left ventricular diastolic  function could  not be evaluated.   2. Right ventricular systolic function is normal. The right ventricular  size is normal.   3. The mitral valve is grossly normal. No evidence of mitral valve  regurgitation. No evidence of mitral stenosis.   4. The aortic valve was not well visualized. Aortic valve regurgitation  is not visualized. No aortic stenosis is present.   5. Aortic dilatation noted. There is mild dilatation of the ascending  aorta, measuring 41 mm.   6. The inferior vena cava is normal in size with greater than 50%  respiratory variability, suggesting right atrial pressure of 3 mmHg.  Past Medical History:  Diagnosis Date   Anxiety    Benign prostatic hypertrophy    COVID-19 virus infection 02/24/2020   Depression    ED (erectile dysfunction)    Headache(784.0)    History of kidney stones    Hypertension    Hypogonadism male    Low back pain 06/09/2020   Nephrolithiasis    hx of had lithotripsy in 1-10.   NEPHROLITHIASIS, HX OF 10/25/2008   Qualifier: Diagnosis of   By: Clent Ridges MD, Tera Mater      Neuromuscular disorder [facility])    neuropaty legs   Pneumonia    Pyelonephritis 02/25/2022   Stroke [facility])    fall 2023 some memory issues like time of day    Past Surgical History:  Procedure Laterality Date   colonoscopy  10/05/2020   per Dr. Russella Dar, adenomatous polyps, repeat in 3 yrs   FOOT SURGERY     LAPAROSCOPIC APPENDECTOMY  03/12/2012   Procedure: APPENDECTOMY LAPAROSCOPIC;  Surgeon: Shelly Rubenstein, MD;  Location: [facility];  Service: General;  Laterality: N/A;   STAPLE HEMORRHOIDECTOMY  04/14/2010   per Dr. Michaell Cowing    TONSILLECTOMY      MEDICATIONS:  amLODipine (NORVASC) 5 MG tablet   aspirin EC 81 MG tablet   atorvastatin (LIPITOR) 20 MG tablet   azithromycin (ZITHROMAX Z-PAK) 250 MG tablet   cyanocobalamin (VITAMIN B12) 1000 MCG/ML injection   cyclobenzaprine (FLEXERIL) 10 MG tablet   finasteride (PROSCAR) 5 MG tablet   fluticasone (FLONASE) 50 MCG/ACT nasal spray   folic  acid (FOLVITE) 1 MG tablet   furosemide (LASIX) 20 MG tablet   hydrochlorothiazide (HYDRODIURIL) 25 MG tablet   HYDROcodone bit-homatropine (HYCODAN) 5-1.5 MG/5ML syrup   ketoconazole (NIZORAL) 2 % cream   KLOR-CON M10 10 MEQ tablet   loperamide (IMODIUM) 2 MG capsule   losartan (COZAAR) 100 MG tablet   Multiple Vitamin (MULTIVITAMIN WITH MINERALS) TABS tablet   ondansetron (ZOFRAN) 4 MG tablet   sertraline (ZOLOFT) 100 MG tablet   SYRINGE-NEEDLE, DISP, 3 ML (BD ECLIPSE SYRINGE/NEEDLE) 25G X 5/8" 3 ML MISC   tamsulosin (FLOMAX) 0.4 MG CAPS capsule   thiamine (VITAMIN B-1) 100 MG tablet   No current facility-administered medications for this encounter.   Calvin Cipro Ward, PA-C [facility] [phone]

## 2022-12-24 NOTE — Anesthesia Preprocedure Evaluation (Signed)
Anesthesia Evaluation  Patient identified by [name], date of birth, ID band Patient awake    Reviewed: Allergy & Precautions, NPO status , Patient's Chart, lab work & pertinent test results  History of Anesthesia Complications Negative for: history of anesthetic complications  Airway Mallampati: III  TM Distance: >3 FB Neck ROM: Full    Dental no notable dental hx.    Pulmonary    Pulmonary exam normal        Cardiovascular hypertension, Pt. on medications Normal cardiovascular exam     Neuro/Psych  Headaches  Anxiety Depression    CVA    GI/Hepatic negative GI ROS, Neg liver ROS,,,  Endo/Other  negative endocrine ROS    Renal/GU URINARY RETENTION, GROSS HEMATURIA     Musculoskeletal  (+) Arthritis ,    Abdominal   Peds  Hematology negative hematology ROS (+)   Anesthesia Other Findings Day of surgery medications reviewed with patient.  Reproductive/Obstetrics                              Anesthesia Physical Anesthesia Plan  ASA: 3  Anesthesia Plan: General   Post-op Pain Management: Tylenol PO (pre-op)*   Induction: Intravenous  PONV Risk Score and Plan: 2 and Treatment may vary due to age or medical condition, Dexamethasone and Ondansetron  Airway Management Planned: LMA  Additional Equipment: None  Intra-op Plan:   Post-operative Plan: Extubation in OR  Informed Consent: I have reviewed the patients History and Physical, chart, labs and discussed the procedure including the risks, benefits and alternatives for the proposed anesthesia with the patient or authorized representative who has indicated his/her understanding and acceptance.     Dental advisory given  Plan Discussed with: CRNA  Anesthesia Plan Comments: (See PAT note 12/19/2022)        Anesthesia Quick Evaluation

## 2022-12-25 ENCOUNTER — Encounter (HOSPITAL_COMMUNITY): Payer: Self-pay | Admitting: Urology

## 2022-12-25 ENCOUNTER — Ambulatory Visit (HOSPITAL_BASED_OUTPATIENT_CLINIC_OR_DEPARTMENT_OTHER): Payer: Medicare HMO | Admitting: Certified Registered Nurse Anesthetist

## 2022-12-25 ENCOUNTER — Ambulatory Visit (HOSPITAL_COMMUNITY): Payer: Medicare HMO | Admitting: Physician Assistant

## 2022-12-25 ENCOUNTER — Ambulatory Visit (HOSPITAL_COMMUNITY)
Admission: RE | Admit: 2022-12-25 | Discharge: 2022-12-25 | Disposition: A | Discharge status: Home / Self Care | Payer: Medicare HMO | Attending: Urology | Admitting: Urology

## 2022-12-25 ENCOUNTER — Ambulatory Visit (HOSPITAL_COMMUNITY): Payer: Medicare HMO

## 2022-12-25 ENCOUNTER — Encounter (HOSPITAL_COMMUNITY)
Admission: RE | Disposition: A | Discharge status: Home / Self Care | Payer: Self-pay | Source: Home / Self Care | Attending: Urology

## 2022-12-25 DIAGNOSIS — E782 Mixed hyperlipidemia: Secondary | ICD-10-CM | POA: Diagnosis not present

## 2022-12-25 DIAGNOSIS — Q549 Hypospadias, unspecified: Secondary | ICD-10-CM

## 2022-12-25 DIAGNOSIS — S3739XA Other injury of urethra, initial encounter: Secondary | ICD-10-CM | POA: Diagnosis not present

## 2022-12-25 DIAGNOSIS — I10 Essential (primary) hypertension: Secondary | ICD-10-CM | POA: Insufficient documentation

## 2022-12-25 DIAGNOSIS — N210 Calculus in bladder: Secondary | ICD-10-CM | POA: Insufficient documentation

## 2022-12-25 DIAGNOSIS — N368 Other specified disorders of urethra: Secondary | ICD-10-CM | POA: Insufficient documentation

## 2022-12-25 DIAGNOSIS — Z8673 Personal history of transient ischemic attack (TIA), and cerebral infarction without residual deficits: Secondary | ICD-10-CM | POA: Insufficient documentation

## 2022-12-25 DIAGNOSIS — F418 Other specified anxiety disorders: Secondary | ICD-10-CM | POA: Diagnosis not present

## 2022-12-25 DIAGNOSIS — N401 Enlarged prostate with lower urinary tract symptoms: Secondary | ICD-10-CM | POA: Diagnosis not present

## 2022-12-25 DIAGNOSIS — R339 Retention of urine, unspecified: Secondary | ICD-10-CM | POA: Diagnosis not present

## 2022-12-25 DIAGNOSIS — R338 Other retention of urine: Secondary | ICD-10-CM | POA: Diagnosis not present

## 2022-12-25 DIAGNOSIS — Z8249 Family history of ischemic heart disease and other diseases of the circulatory system: Secondary | ICD-10-CM | POA: Insufficient documentation

## 2022-12-25 DIAGNOSIS — Z823 Family history of stroke: Secondary | ICD-10-CM | POA: Insufficient documentation

## 2022-12-25 DIAGNOSIS — N312 Flaccid neuropathic bladder, not elsewhere classified: Secondary | ICD-10-CM | POA: Diagnosis not present

## 2022-12-25 DIAGNOSIS — R310 Gross hematuria: Secondary | ICD-10-CM | POA: Insufficient documentation

## 2022-12-25 DIAGNOSIS — Z79899 Other long term (current) drug therapy: Secondary | ICD-10-CM | POA: Diagnosis not present

## 2022-12-25 DIAGNOSIS — M1990 Unspecified osteoarthritis, unspecified site: Secondary | ICD-10-CM | POA: Insufficient documentation

## 2022-12-25 HISTORY — PX: INSERTION OF SUPRAPUBIC CATHETER: SHX5870

## 2022-12-25 HISTORY — PX: CYSTOSCOPY W/ RETROGRADES: SHX1426

## 2022-12-25 SURGERY — INSERTION, SUPRAPUBIC CATHETER
Anesthesia: General

## 2022-12-25 MED ORDER — LIDOCAINE HCL (PF) 2 % IJ SOLN
INTRAMUSCULAR | Status: AC
  Filled 2022-12-25: qty 5

## 2022-12-25 MED ORDER — AMISULPRIDE (ANTIEMETIC) 5 MG/2ML IV SOLN: 10.0000 mg | Freq: Once | INTRAVENOUS | Status: DC | PRN

## 2022-12-25 MED ORDER — DEXAMETHASONE SODIUM PHOSPHATE 10 MG/ML IJ SOLN
INTRAMUSCULAR | Status: AC
  Filled 2022-12-25: qty 1

## 2022-12-25 MED ORDER — BUPIVACAINE HCL (PF) 0.25 % IJ SOLN
INTRAMUSCULAR | Status: DC | PRN
  Administered 2022-12-25: 10 mL

## 2022-12-25 MED ORDER — LIDOCAINE 2% (20 MG/ML) 5 ML SYRINGE
INTRAMUSCULAR | Status: DC | PRN
  Administered 2022-12-25: 100 mg via INTRAVENOUS

## 2022-12-25 MED ORDER — EPHEDRINE SULFATE-NACL 50-0.9 MG/10ML-% IV SOSY
PREFILLED_SYRINGE | INTRAVENOUS | Status: DC | PRN
  Administered 2022-12-25: 5 mg via INTRAVENOUS
  Administered 2022-12-25 (×2): 7.5 mg via INTRAVENOUS

## 2022-12-25 MED ORDER — FENTANYL CITRATE PF 50 MCG/ML IJ SOSY: 25.0000 ug | PREFILLED_SYRINGE | INTRAMUSCULAR | Status: DC | PRN

## 2022-12-25 MED ORDER — ORAL CARE MOUTH RINSE: 15.0000 mL | Freq: Once | OROMUCOSAL | Status: AC

## 2022-12-25 MED ORDER — PHENYLEPHRINE 80 MCG/ML (10ML) SYRINGE FOR IV PUSH (FOR BLOOD PRESSURE SUPPORT)
PREFILLED_SYRINGE | INTRAVENOUS | Status: DC | PRN
  Administered 2022-12-25 (×7): 160 ug via INTRAVENOUS

## 2022-12-25 MED ORDER — FENTANYL CITRATE (PF) 100 MCG/2ML IJ SOLN
INTRAMUSCULAR | Status: DC | PRN
  Administered 2022-12-25 (×2): 50 ug via INTRAVENOUS

## 2022-12-25 MED ORDER — ACETAMINOPHEN 500 MG PO TABS
1000.0000 mg | ORAL_TABLET | Freq: Once | ORAL | Status: AC
  Administered 2022-12-25: 1000 mg via ORAL
  Filled 2022-12-25: qty 2

## 2022-12-25 MED ORDER — BUPIVACAINE HCL (PF) 0.25 % IJ SOLN
INTRAMUSCULAR | Status: AC
  Filled 2022-12-25: qty 30

## 2022-12-25 MED ORDER — DEXAMETHASONE SODIUM PHOSPHATE 4 MG/ML IJ SOLN
INTRAMUSCULAR | Status: DC | PRN
  Administered 2022-12-25: 5 mg via INTRAVENOUS

## 2022-12-25 MED ORDER — CEFAZOLIN SODIUM-DEXTROSE 2-4 GM/100ML-% IV SOLN
2.0000 g | INTRAVENOUS | Status: AC
  Administered 2022-12-25: 2 g via INTRAVENOUS
  Filled 2022-12-25: qty 100

## 2022-12-25 MED ORDER — EPHEDRINE 5 MG/ML INJ
INTRAVENOUS | Status: AC
  Filled 2022-12-25: qty 5

## 2022-12-25 MED ORDER — SODIUM CHLORIDE 0.9 % IR SOLN
Status: DC | PRN
  Administered 2022-12-25: 15000 mL

## 2022-12-25 MED ORDER — ONDANSETRON HCL 4 MG/2ML IJ SOLN
INTRAMUSCULAR | Status: AC
  Filled 2022-12-25: qty 2

## 2022-12-25 MED ORDER — ONDANSETRON HCL 4 MG/2ML IJ SOLN
INTRAMUSCULAR | Status: DC | PRN
  Administered 2022-12-25: 4 mg via INTRAVENOUS

## 2022-12-25 MED ORDER — IOHEXOL 300 MG/ML  SOLN
INTRAMUSCULAR | Status: DC | PRN
  Administered 2022-12-25: 10 mL

## 2022-12-25 MED ORDER — OXYCODONE HCL 5 MG/5ML PO SOLN: 5.0000 mg | Freq: Once | ORAL | Status: DC | PRN

## 2022-12-25 MED ORDER — PHENYLEPHRINE 80 MCG/ML (10ML) SYRINGE FOR IV PUSH (FOR BLOOD PRESSURE SUPPORT)
PREFILLED_SYRINGE | INTRAVENOUS | Status: AC
  Filled 2022-12-25: qty 10

## 2022-12-25 MED ORDER — OXYCODONE HCL 5 MG PO TABS: 5.0000 mg | ORAL_TABLET | Freq: Once | ORAL | Status: DC | PRN

## 2022-12-25 MED ORDER — PROPOFOL 10 MG/ML IV BOLUS
INTRAVENOUS | Status: DC | PRN
  Administered 2022-12-25: 180 mg via INTRAVENOUS

## 2022-12-25 MED ORDER — HYDROCODONE-ACETAMINOPHEN 5-325 MG PO TABS: 1.0000 | ORAL_TABLET | ORAL | 0 refills | Status: DC | PRN

## 2022-12-25 MED ORDER — FENTANYL CITRATE (PF) 100 MCG/2ML IJ SOLN
INTRAMUSCULAR | Status: AC
  Filled 2022-12-25: qty 2

## 2022-12-25 MED ORDER — CHLORHEXIDINE GLUCONATE 0.12 % MT SOLN
15.0000 mL | Freq: Once | OROMUCOSAL | Status: AC
  Administered 2022-12-25: 15 mL via OROMUCOSAL

## 2022-12-25 MED ORDER — LACTATED RINGERS IV SOLN: INTRAVENOUS | Status: DC

## 2022-12-25 SURGICAL SUPPLY — 36 items
ADH SKN CLS APL DERMABOND .7 (GAUZE/BANDAGES/DRESSINGS) ×2
BAG DRN RND TRDRP ANRFLXCHMBR (UROLOGICAL SUPPLIES) ×2
BAG URINE DRAIN 2000ML AR STRL (UROLOGICAL SUPPLIES) IMPLANT
BAG URO CATCHER STRL LF (MISCELLANEOUS) ×2 IMPLANT
CATH FOLEY 2WAY SLVR 5CC 16FR (CATHETERS) IMPLANT
CATH FOLEY 2WAY SLVR 5CC 18FR (CATHETERS) IMPLANT
CATH FOLEY INTRO SUPRA 16F (CATHETERS) IMPLANT
CATH URETL OPEN END 6FR 70 (CATHETERS) ×2 IMPLANT
CLOTH BEACON ORANGE TIMEOUT ST (SAFETY) ×2 IMPLANT
CNTNR URN SCR LID CUP LEK RST (MISCELLANEOUS) IMPLANT
CONT SPEC 4OZ STRL OR WHT (MISCELLANEOUS) ×2
COUNTER NDL 20CT MAGNET RED (NEEDLE) IMPLANT
COVER SURGICAL LIGHT HANDLE (MISCELLANEOUS) IMPLANT
DERMABOND ADVANCED .7 DNX12 (GAUZE/BANDAGES/DRESSINGS) IMPLANT
GAUZE 4X4 16PLY ~~LOC~~+RFID DBL (SPONGE) IMPLANT
GAUZE SPONGE 4X4 12PLY STRL (GAUZE/BANDAGES/DRESSINGS) IMPLANT
GLOVE BIO SURGEON STRL SZ 6.5 (GLOVE) ×2 IMPLANT
GOWN STRL REUS W/ TWL LRG LVL3 (GOWN DISPOSABLE) ×2 IMPLANT
GOWN STRL REUS W/TWL LRG LVL3 (GOWN DISPOSABLE) ×2
GUIDEWIRE STR DUAL SENSOR (WIRE) ×2 IMPLANT
LOOP CUT BIPOLAR 24F LRG (ELECTROSURGICAL) IMPLANT
MANIFOLD NEPTUNE II (INSTRUMENTS) ×2 IMPLANT
NDL HYPO 22X1.5 SAFETY MO (MISCELLANEOUS) IMPLANT
NDL SPNL 22GX3.5 QUINCKE BK (NEEDLE) IMPLANT
NEEDLE HYPO 22X1.5 SAFETY MO (MISCELLANEOUS) ×2 IMPLANT
NEEDLE SPNL 22GX3.5 QUINCKE BK (NEEDLE) ×2 IMPLANT
PACK CYSTO (CUSTOM PROCEDURE TRAY) ×2 IMPLANT
SPONGE DRAIN TRACH 4X4 STRL 2S (GAUZE/BANDAGES/DRESSINGS) IMPLANT
SUT CHROMIC 2 0 SH (SUTURE) IMPLANT
SUT SILK 2 0 30 PSL (SUTURE) IMPLANT
SUT VIC AB 2-0 SH 27 (SUTURE) ×4
SUT VIC AB 2-0 SH 27X BRD (SUTURE) IMPLANT
SYR 20ML LL LF (SYRINGE) IMPLANT
SYR CONTROL 10ML LL (SYRINGE) IMPLANT
TUBING CONNECTING 10 (TUBING) ×2 IMPLANT
TUBING UROLOGY SET (TUBING) IMPLANT

## 2022-12-25 NOTE — Transfer of Care (Signed)
Immediate Anesthesia Transfer of Care Note  Patient: Calvin [name]  Procedure(s) Performed: INSERTION OF SUPRAPUBIC CATHETER CYSTOSCOPY, TRANSURETHERAL RESECTION OF PROSTATE (Bilateral)  Patient Location: PACU  Anesthesia Type:General  Level of Consciousness: awake and patient cooperative  Airway & Oxygen Therapy: Patient Spontanous Breathing and Patient connected to face mask  Post-op Assessment: Report given to RN and Post -op Vital signs reviewed and stable  Post vital signs: Reviewed and stable  Last Vitals:  Vitals Value Taken Time  BP 141/96 12/25/22 1230  Temp    Pulse 99 12/25/22 1232  Resp 18 12/25/22 1232  SpO2 98 % 12/25/22 1232  Vitals shown include unvalidated device data.  Last Pain:  Vitals:   12/25/22 1005  TempSrc:   PainSc: 0-No pain         Complications: No notable events documented.

## 2022-12-25 NOTE — Discharge Instructions (Addendum)
Cystoscopy with Suprapubic tube placement patient instructions  Following a cystoscopy, a catheter (a flexible rubber tube) is sometimes left in place to empty the bladder. This may cause some discomfort or a feeling that you need to urinate. Your doctor determines the period of time that the catheter will be left in place. You may have bloody urine for two to three days (Call your doctor if the amount of bleeding increases or does not subside).  You may pass blood clots in your urine, especially if you had a biopsy. It is not unusual to pass small blood clots and have some bloody urine a couple of weeks after your cystoscopy. Again, call your doctor if the bleeding does not subside. You may have: Dysuria (painful urination) Frequency (urinating often) Urgency (strong desire to urinate)  These symptoms are common especially if medicine is instilled into the bladder or a ureteral stent is placed. Avoiding alcohol and caffeine, such as coffee, tea, and chocolate, may help relieve these symptoms. Drink plenty of water, unless otherwise instructed. Your doctor may also prescribe an antibiotic or other medicine to reduce these symptoms.  Cystoscopy results are available soon after the procedure; biopsy results usually take two to four days. Your doctor will discuss the results of your exam with you. Before you go home, you will be given specific instructions for follow-up care. Special Instructions:   If you are going home with a catheter in place do not take a tub bath until removed by your doctor.   You may resume your normal activities.   Do not drive or operate machinery if you are taking narcotic pain medicine.   Be sure to keep all follow-up appointments with your doctor.   Call Your Doctor If: The catheter is not draining You have severe pain You are unable to urinate You have a fever over 101 You have severe bleeding

## 2022-12-25 NOTE — Interval H&P Note (Signed)
History and Physical Interval Note:  12/25/2022 10:43 AM  Calvin [name]  has presented today for surgery, with the diagnosis of URINARY RETENTION GROSS HEMATURIA.  The various methods of treatment have been discussed with the patient and family. After consideration of risks, benefits and other options for treatment, the patient has consented to  Procedure(s) with comments: INSERTION OF SUPRAPUBIC CATHETER (N/A) CYSTOSCOPY WITH  BILATERAL RETROGRADE PYELOGRAM (Bilateral) - 90 MINS FRO CASE as a surgical intervention.  The patient's history has been reviewed, patient examined, no change in status, stable for surgery.  I have reviewed the patient's chart and labs.  Questions were answered to the patient's satisfaction.     Dennies Coate D Eriona Kinchen

## 2022-12-25 NOTE — Anesthesia Procedure Notes (Signed)
Procedure Name: LMA Insertion Date/Time: 12/25/2022 11:10 AM  Performed by: Vanessa Waterloo, CRNAPre-anesthesia Checklist: Emergency Drugs available, Patient identified, Suction available and Patient being monitored Patient Re-evaluated:Patient Re-evaluated prior to induction Oxygen Delivery Method: Circle system utilized Preoxygenation: Pre-oxygenation with 100% oxygen Induction Type: IV induction Ventilation: Mask ventilation without difficulty LMA: LMA inserted LMA Size: 4.0 Number of attempts: 1 Placement Confirmation: positive ETCO2 and breath sounds checked- equal and bilateral Tube secured with: Tape Dental Injury: Teeth and Oropharynx as per pre-operative assessment

## 2022-12-25 NOTE — Op Note (Signed)
Operative Note  Preoperative diagnosis:  1.  Traumatic hypospadias 2.   Urinary retention 3.   Abnormal imaging - dilation of collecting system bilaterally right greater than left with debris in bladder  Postoperative diagnosis: 1.  Traumatic hypospadias 2.   Urinary retention 3.   Abnormal imaging - dilation of collecting system bilaterally right greater than left with debris in bladder  Procedure(s): 1.  Diagnostic cystoscopy 2.  Insertion of suprapubic tube 3.  Transurethral resection of necrotic prostatic urethral tissue  Surgeon: Kasandra Knudsen, MD  Assistants:  None  Anesthesia:  General  Complications:  None  EBL:  minimal  Specimens: 1. none  Drains/Catheters: 1.  18Fr suprapubic tube  Intraoperative findings:   Normal urethra Unable to visualize either ureteral orifice due to prominent intravesical median lobe and edema Moderate trabeculation Multiple small bladder calculi trapped under median lobe Significant lateral lobe obstruction, 4cm prostatic urethra, significant intravesical median lobe Edema at bladder neck Necrotic appearing tissue right lateral prostatic lobe  Indication:  ALEXANDRE REXROAD is a 77 y.o. male with urinary retention managed with indwelling Foley catheter who developed a traumatic hypospadias of the distal shaft.  On imaging he had some perinephric stranding and debris in the bladder.  He may be moving to [place] with his daughter in the next few months.  Description of procedure:  After risks and benefits of the procedure discussed the patient, informed consent was obtained.  The patient taken to the operating room placed in supine position.  Anesthesia was induced and antibiotics were administered.  He was then repositioned in the dorsolithotomy position.  His existing indwelling Foley catheter was removed.  He was prepped and draped in usual sterile fashion timeout is performed.  A 21 French rigid cystoscope was placed in the urethral  meatus and advanced in the bladder and direct visualization.  Findings noted above.  Bilateral ureteral orifices could not be seen secondary to edema from indwelling Foley catheter as well as intravesical growth of prostate into the bladder.  There was some small bladder calculi scattered throughout the bladder.  They became trapped posterior to the intravesical median lobe component.  They were removed with Toomey syringe.   The cystoscope was removed and the resectoscope using the visual obturator was then advanced into the bladder.  There is a false passage noted in the distal prostatic urethra.  The necrotic tissue in the right lobe of the prostatic urethra was then resected using the bipolar loop.  Hemostasis was adequate with the irrigant turned off.  Attempts at removing some of the intravesical component and edema were also made to potentially access the ureteral orifices but they were not seen.  The decision was made to abort the retrograde pyelograms.  Next attention turned to the suprapubic area.  A site was marked 2 fingerbreadths above the pubic symphysis.  Incision was made with a knife and this was carried down with Bovie electrocautery.  The bladder was filled with the cystoscope.  A spinal needle used to confirm location of the bladder.  A punch cystotostomy was then made and a 16 French Foley catheter was placed through the introducer.  The Foley was inflated with 10 cc sterile water.  The introducer was removed.  The incision was then closed in 2 layers.   The patient emerged from anesthesia and transferred back in stable condition.  Plan: Patient will need SP tube change in the office in 6 weeks

## 2022-12-25 NOTE — H&P (Signed)
CC/HPI: c: BPH with LUTs/urinary retention   04/20/22: 77 year old man 9/9 through 9/15 for AKI secondary to obstructive uropathy, acute urinary retention, bilateral hydronephrosis with noted bilateral perinephric stranding and possible pyelonephritis. He was then readmitted 10/1-10/4 for obstructed Foley with gross hematuria. Patient is currently in a rehab facility but would ultimately like to go home. He was seen by nurse practitioner Hollice Espy in the office last month at which time he failed a void trial. He does not have any urge to urinate. Patient's PSA was 0.7 on 03/14/2022. He currently has a 22 French Foley in.   05/17/2022: Added on today for catheter pain. It was changed yesterday at SNF and took several attempts per pt. He is on tamsulosin and finasteride. Prostate about 110 g on CT Sep 2023.   08/31/2022: 77 year old man with a history of urinary retention managed with indwelling Foley catheter here for urodynamics results. Urodynamic showed increased bladder capacity with bladder a contractility. Patient had decreased sensation and no ability to void as well. He had some difficulty with the catheter not draining well on and off. It is being changed by home health.   UDS SUMMARY  Mr. Limbach held a max capacity of approx. 1232 mls. His 1st sensation was felt at 562 mls. No instability was noted. Rectal spasms were also noted. He did not generate a voluntary contraction and void. He attempted for several minutes to void. Trabeculation and some elevation of the bladder base was noted. No reflux was seen. A 16 French Foley catheter was reinserted before the patient left the clinic.   11/28/2022: 77 year old man with history of urinary retention managed with indwelling Foley catheter care for follow-up. Approximately 1 month ago he was seen in the ED for clot urinary retention. This cleared with some irrigation and catheter change. When the catheter was upsized, his urethral meatus did split. He comes in  today with his significant other. She is asking about possible cancer diagnosis. On chart review I see a CT scan with perinephric stranding in the prostate going up into the bladder. Patient has not yet had a cystoscopy. He has had a PSA in May 2024 that was 1.0.     ALLERGIES: Sulfa Drugs    MEDICATIONS: Amlodipine Besylate 5 mg tablet 1 tablet PO Daily  Atorvastatin Calcium 20 mg tablet 1 tablet PO Daily  Cyclobenzaprine Hcl 10 mg tablet 1 tablet PO Daily  Diazepam 5 mg tablet 1 tablet PO Daily  Finasteride 5 mg tablet 1 tablet PO Daily  Hydrochlorothiazide 25 mg tablet 1 tablet PO Daily  Klor-Con M10 10 meq tablet, ext release, particles/crystals 1 tablet PO Daily  Losartan Potassium 100 mg tablet 1 tablet PO Daily  Potassium Chloride 10 meq tablet, extended release 1 tablet PO Daily  Sertraline Hcl 100 mg tablet 1 tablet PO Daily  Tamsulosin Hcl 0.4 mg capsule 1 capsule PO Daily  Tramadol Hcl 50 mg tablet     GU PSH: Complex cystometrogram, w/ void pressure and urethral pressure profile studies, any technique - 07/27/2022 Complex Uroflow - 07/27/2022 Emg surf Electrd - 07/27/2022 ESWL - 2009 Inject For cystogram - 07/27/2022 Intrabd voidng Press - 07/27/2022     NON-GU PSH: Appendectomy (open) Visit Complexity (formerly GPC1X) - 08/31/2022, 08/14/2022     GU PMH: Areflexic bladder - 08/31/2022, - 08/14/2022 BPH w/LUTS - 08/31/2022, - 08/14/2022, Patient reports this foley seems much more comfortable and he "doesn't even feel it). cont tams and finasteride. he seems to weak for surgery  at this point. Keep f/u as planned with Dr. Arita Miss. He asked about simply removing the foley and we discussed the nature r/b/a to a foley. Foley replaced. , - 05/17/2022, - 04/20/2022, - 03/13/2022, Benign prostatic hyperplasia with urinary obstruction, - 2014 Incomplete bladder emptying - 08/31/2022, - 05/17/2022, - 04/20/2022 Urinary Retention - 08/31/2022, - 08/14/2022, - 07/27/2022, - 04/20/2022, - 03/13/2022 Disorder  of male genital organs, unspecified - 2017 Epididymitis - 2017 Renal cyst, Renal cyst, acquired, left - 2014 Ureteral calculus, Ureteral Stone - 2014      PMH Notes:  2008-01-29 11:59:02 - Note: Normal Routine History And Physical Adult   NON-GU PMH: Personal history of other diseases of the circulatory system, History of hypertension - 2014 Anxiety Hypercholesterolemia Hypertension Personal history of other mental and behavioral disorders    FAMILY HISTORY: Deceased - Father, Mother Family Health Status Number - Runs In Family heart failure - Mother Stroke Syndrome - Father   SOCIAL HISTORY: Marital Status: Single Preferred Language: English; Race: White Current Smoking Status: Patient has never smoked.  Drinks 3 drinks per day.  Drinks 2 caffeinated drinks per day. Patient's occupation [employer] business.    REVIEW OF SYSTEMS:    GU Review Male:   Patient denies frequent urination, hard to postpone urination, burning/ pain with urination, get up at night to urinate, leakage of urine, stream starts and stops, trouble starting your stream, have to strain to urinate , erection problems, and penile pain.  Gastrointestinal (Upper):   Patient denies nausea, vomiting, and indigestion/ heartburn.  Gastrointestinal (Lower):   Patient denies diarrhea and constipation.  Constitutional:   Patient denies fever, night sweats, weight loss, and fatigue.  Skin:   Patient denies skin rash/ lesion and itching.  Eyes:   Patient denies blurred vision and double vision.  Ears/ Nose/ Throat:   Patient denies sore throat and sinus problems.  Hematologic/Lymphatic:   Patient denies swollen glands and easy bruising.  Cardiovascular:   Patient denies leg swelling and chest pains.  Respiratory:   Patient denies cough and shortness of breath.  Endocrine:   Patient denies excessive thirst.  Musculoskeletal:   Patient denies back pain and joint pain.  Neurological:   Patient denies headaches and  dizziness.  Psychologic:   Patient denies depression and anxiety.   VITAL SIGNS: None   MULTI-SYSTEM PHYSICAL EXAMINATION:    Constitutional: Well-nourished. No physical deformities. Normally developed. Good grooming.  Neck: Neck symmetrical, not swollen. Normal tracheal position.  Respiratory: No labored breathing, no use of accessory muscles.   Skin: No paleness, no jaundice, no cyanosis. No lesion, no ulcer, no rash.  Neurologic / Psychiatric: Oriented to time, oriented to place, oriented to person. No depression, no anxiety, no agitation.  Eyes: Normal conjunctivae. Normal eyelids.  Ears, Nose, Mouth, and Throat: Left ear no scars, no lesions, no masses. Right ear no scars, no lesions, no masses. Nose no scars, no lesions, no masses. Normal hearing. Normal lips.  Musculoskeletal: Normal gait and station of head and neck.     Complexity of Data:  Records Review:   Previous Patient Records, POC Tool  Urine Test Review:   Urinalysis  X-Ray Review: C.T. Abdomen/Pelvis: Reviewed Films. Reviewed Report. Discussed With Patient. IMPRESSION:  1. Bladder is markedly distended.  2. Prostate gland is heterogeneously enlarged, with irregular  thickening anteriorly, causing mass effect on the bladder base and  possible extension into the bladder wall, and causing the severe  bladder distention.  3. Associated bilateral hydronephrosis,  moderate to severe in  degree, RIGHT greater than LEFT, with associated perinephric and  periureteral inflammation/fluid stranding. Findings are highly  suspicious for a neoplastic process. Again, can not exclude prostate  neoplasm with extension to the inferior bladder wall. Recommend  Urology consultation for further workup considerations.   Aortic Atherosclerosis (ICD10-I70.0).    Electronically Signed  By: Bary Richard M.D.  On: 02/25/2022 10:08   IMPRESSION:  Dilated urinary bladder with some layering dependent high  attenuation material, possible  hemorrhage with the patient's history  although in principal lesion is not excluded. Associated moderate  collecting system of the right kidney and mild left. There is an  enlarged prostate with a Foley catheter in place but the balloon of  the catheter is in the prostatic urethra. Recommend this be  repositioned. In addition after draining the bladder recommend  follow-up renal ultrasound to confirm resolution of the dilatation  of the collecting systems of the kidneys. This dilatation may be  passive.   Stable hepatic, renal cysts. Benign-appearing right adrenal nodule  which has been present since at least 2013.    Electronically Signed  By: Karen Kays M.D.  On: 06/21/2022 13:32    10/22/22  PSA  Total PSA 1.01 ng/mL   Notes:                     10/29/2022: BUN 16, creatinine 0.74, GFR 89.7   PROCEDURES: None   ASSESSMENT:      ICD-10 Details  1 GU:   Areflexic bladder - N31.2 Chronic, Stable  2   BPH w/LUTS - N40.1 Chronic, Stable  3   Urinary Retention - R33.8 Chronic, Stable  4   Gross hematuria - R31.0 Undiagnosed New Problem   PLAN:           Document Letter(s):  Created for Patient: Clinical Summary         Notes:   Urinary retention with traumatic hypospadias:  -Discussed transitioning from indwelling urethral Foley to suprapubic tube  -Risks and benefits of the procedure were discussed with the patient in detail including but not limited to pain, bleeding, infection, damage to surrounding structures, need for additional treatment   Gross hematuria:  -Will schedule cystoscopy with bilateral retrograde pyelograms at same time as suprapubic tube placement  -This will allow further evaluation of findings noted on CT scan from September 2023

## 2022-12-25 NOTE — Anesthesia Postprocedure Evaluation (Signed)
Anesthesia Post Note  Patient: BERNERD JEREZ  Procedure(s) Performed: INSERTION OF SUPRAPUBIC CATHETER CYSTOSCOPY, TRANSURETHERAL RESECTION OF PROSTATE (Bilateral)     Patient location during evaluation: PACU Anesthesia Type: General Level of consciousness: awake and alert Pain management: pain level controlled Vital Signs Assessment: post-procedure vital signs reviewed and stable Respiratory status: spontaneous breathing, nonlabored ventilation and respiratory function stable Cardiovascular status: blood pressure returned to baseline Postop Assessment: no apparent nausea or vomiting Anesthetic complications: no   No notable events documented.  Last Vitals:  Vitals:   12/25/22 1300 12/25/22 1315  BP: 137/74 139/88  Pulse: 93 93  Resp: 14 14  Temp: (!) 36.3 C   SpO2: 93% 94%    Last Pain:  Vitals:   12/25/22 1315  TempSrc:   PainSc: 0-No pain                 Shanda Howells

## 2022-12-26 ENCOUNTER — Encounter (HOSPITAL_COMMUNITY): Payer: Self-pay | Admitting: Urology

## 2022-12-26 DIAGNOSIS — F32A Depression, unspecified: Secondary | ICD-10-CM | POA: Diagnosis not present

## 2022-12-26 DIAGNOSIS — R2681 Unsteadiness on feet: Secondary | ICD-10-CM | POA: Diagnosis not present

## 2022-12-26 DIAGNOSIS — Z466 Encounter for fitting and adjustment of urinary device: Secondary | ICD-10-CM | POA: Diagnosis not present

## 2022-12-26 DIAGNOSIS — R338 Other retention of urine: Secondary | ICD-10-CM | POA: Diagnosis not present

## 2022-12-26 DIAGNOSIS — R262 Difficulty in walking, not elsewhere classified: Secondary | ICD-10-CM | POA: Diagnosis not present

## 2022-12-26 DIAGNOSIS — I10 Essential (primary) hypertension: Secondary | ICD-10-CM | POA: Diagnosis not present

## 2022-12-26 DIAGNOSIS — E538 Deficiency of other specified B group vitamins: Secondary | ICD-10-CM | POA: Diagnosis not present

## 2022-12-26 DIAGNOSIS — Z9181 History of falling: Secondary | ICD-10-CM | POA: Diagnosis not present

## 2022-12-26 DIAGNOSIS — R296 Repeated falls: Secondary | ICD-10-CM | POA: Diagnosis not present

## 2022-12-26 DIAGNOSIS — F418 Other specified anxiety disorders: Secondary | ICD-10-CM | POA: Diagnosis not present

## 2022-12-26 DIAGNOSIS — M6281 Muscle weakness (generalized): Secondary | ICD-10-CM | POA: Diagnosis not present

## 2022-12-26 DIAGNOSIS — S82832S Other fracture of upper and lower end of left fibula, sequela: Secondary | ICD-10-CM | POA: Diagnosis not present

## 2022-12-26 DIAGNOSIS — Z79899 Other long term (current) drug therapy: Secondary | ICD-10-CM | POA: Diagnosis not present

## 2022-12-26 DIAGNOSIS — Z87440 Personal history of urinary (tract) infections: Secondary | ICD-10-CM | POA: Diagnosis not present

## 2022-12-26 DIAGNOSIS — Z7982 Long term (current) use of aspirin: Secondary | ICD-10-CM | POA: Diagnosis not present

## 2022-12-26 DIAGNOSIS — N401 Enlarged prostate with lower urinary tract symptoms: Secondary | ICD-10-CM | POA: Diagnosis not present

## 2022-12-26 DIAGNOSIS — Z8673 Personal history of transient ischemic attack (TIA), and cerebral infarction without residual deficits: Secondary | ICD-10-CM | POA: Diagnosis not present

## 2022-12-26 DIAGNOSIS — Z8616 Personal history of COVID-19: Secondary | ICD-10-CM | POA: Diagnosis not present

## 2022-12-26 LAB — SURGICAL PATHOLOGY

## 2023-01-02 DIAGNOSIS — F32A Depression, unspecified: Secondary | ICD-10-CM | POA: Diagnosis not present

## 2023-01-02 DIAGNOSIS — Z79899 Other long term (current) drug therapy: Secondary | ICD-10-CM | POA: Diagnosis not present

## 2023-01-02 DIAGNOSIS — Z8673 Personal history of transient ischemic attack (TIA), and cerebral infarction without residual deficits: Secondary | ICD-10-CM | POA: Diagnosis not present

## 2023-01-02 DIAGNOSIS — R338 Other retention of urine: Secondary | ICD-10-CM | POA: Diagnosis not present

## 2023-01-02 DIAGNOSIS — Z9181 History of falling: Secondary | ICD-10-CM | POA: Diagnosis not present

## 2023-01-02 DIAGNOSIS — I10 Essential (primary) hypertension: Secondary | ICD-10-CM | POA: Diagnosis not present

## 2023-01-02 DIAGNOSIS — E538 Deficiency of other specified B group vitamins: Secondary | ICD-10-CM | POA: Diagnosis not present

## 2023-01-02 DIAGNOSIS — Z87440 Personal history of urinary (tract) infections: Secondary | ICD-10-CM | POA: Diagnosis not present

## 2023-01-02 DIAGNOSIS — Z7982 Long term (current) use of aspirin: Secondary | ICD-10-CM | POA: Diagnosis not present

## 2023-01-02 DIAGNOSIS — N401 Enlarged prostate with lower urinary tract symptoms: Secondary | ICD-10-CM | POA: Diagnosis not present

## 2023-01-02 DIAGNOSIS — Z466 Encounter for fitting and adjustment of urinary device: Secondary | ICD-10-CM | POA: Diagnosis not present

## 2023-01-02 DIAGNOSIS — Z8616 Personal history of COVID-19: Secondary | ICD-10-CM | POA: Diagnosis not present

## 2023-01-02 DIAGNOSIS — F418 Other specified anxiety disorders: Secondary | ICD-10-CM | POA: Diagnosis not present

## 2023-01-04 ENCOUNTER — Other Ambulatory Visit: Payer: Self-pay | Admitting: Family Medicine

## 2023-01-05 ENCOUNTER — Emergency Department (HOSPITAL_COMMUNITY): Payer: Medicare HMO

## 2023-01-05 ENCOUNTER — Observation Stay (HOSPITAL_BASED_OUTPATIENT_CLINIC_OR_DEPARTMENT_OTHER)
Admission: EM | Admit: 2023-01-05 | Discharge: 2023-01-06 | Disposition: A | Discharge status: Home / Self Care | Payer: Medicare HMO | Source: Home / Self Care | Attending: Emergency Medicine | Admitting: Emergency Medicine

## 2023-01-05 ENCOUNTER — Encounter (HOSPITAL_COMMUNITY): Payer: Self-pay | Admitting: Emergency Medicine

## 2023-01-05 ENCOUNTER — Other Ambulatory Visit: Payer: Self-pay

## 2023-01-05 DIAGNOSIS — Z9359 Other cystostomy status: Secondary | ICD-10-CM | POA: Insufficient documentation

## 2023-01-05 DIAGNOSIS — R0989 Other specified symptoms and signs involving the circulatory and respiratory systems: Secondary | ICD-10-CM | POA: Diagnosis not present

## 2023-01-05 DIAGNOSIS — Z8673 Personal history of transient ischemic attack (TIA), and cerebral infarction without residual deficits: Secondary | ICD-10-CM | POA: Insufficient documentation

## 2023-01-05 DIAGNOSIS — Z7982 Long term (current) use of aspirin: Secondary | ICD-10-CM | POA: Insufficient documentation

## 2023-01-05 DIAGNOSIS — R935 Abnormal findings on diagnostic imaging of other abdominal regions, including retroperitoneum: Secondary | ICD-10-CM | POA: Diagnosis not present

## 2023-01-05 DIAGNOSIS — D3501 Benign neoplasm of right adrenal gland: Secondary | ICD-10-CM | POA: Diagnosis not present

## 2023-01-05 DIAGNOSIS — Z79899 Other long term (current) drug therapy: Secondary | ICD-10-CM | POA: Insufficient documentation

## 2023-01-05 DIAGNOSIS — E871 Hypo-osmolality and hyponatremia: Secondary | ICD-10-CM | POA: Diagnosis not present

## 2023-01-05 DIAGNOSIS — Z8616 Personal history of COVID-19: Secondary | ICD-10-CM | POA: Insufficient documentation

## 2023-01-05 DIAGNOSIS — R339 Retention of urine, unspecified: Secondary | ICD-10-CM | POA: Diagnosis not present

## 2023-01-05 DIAGNOSIS — T83098A Other mechanical complication of other indwelling urethral catheter, initial encounter: Secondary | ICD-10-CM | POA: Diagnosis not present

## 2023-01-05 DIAGNOSIS — K7689 Other specified diseases of liver: Secondary | ICD-10-CM | POA: Diagnosis not present

## 2023-01-05 DIAGNOSIS — I4891 Unspecified atrial fibrillation: Secondary | ICD-10-CM | POA: Diagnosis present

## 2023-01-05 DIAGNOSIS — J9811 Atelectasis: Secondary | ICD-10-CM | POA: Diagnosis not present

## 2023-01-05 DIAGNOSIS — T83510A Infection and inflammatory reaction due to cystostomy catheter, initial encounter: Secondary | ICD-10-CM | POA: Diagnosis not present

## 2023-01-05 DIAGNOSIS — I10 Essential (primary) hypertension: Secondary | ICD-10-CM | POA: Insufficient documentation

## 2023-01-05 DIAGNOSIS — R509 Fever, unspecified: Secondary | ICD-10-CM | POA: Diagnosis not present

## 2023-01-05 DIAGNOSIS — I517 Cardiomegaly: Secondary | ICD-10-CM | POA: Diagnosis not present

## 2023-01-05 DIAGNOSIS — N390 Urinary tract infection, site not specified: Secondary | ICD-10-CM | POA: Diagnosis not present

## 2023-01-05 LAB — BASIC METABOLIC PANEL
Anion gap: 13 (ref 5–15)
BUN: 15 mg/dL (ref 8–23)
CO2: 22 mmol/L (ref 22–32)
Calcium: 9.4 mg/dL (ref 8.9–10.3)
Chloride: 100 mmol/L (ref 98–111)
Creatinine, Ser: 0.85 mg/dL (ref 0.61–1.24)
GFR, Estimated: >60 mL/min (ref >60)
Glucose, Bld: 139 mg/dL — ABNORMAL HIGH (ref 70–99)
Potassium: 3.4 mmol/L — ABNORMAL LOW (ref 3.5–5.1)
Sodium: 135 mmol/L (ref 135–145)

## 2023-01-05 LAB — URINALYSIS, ROUTINE W REFLEX MICROSCOPIC
Bilirubin Urine: NEGATIVE
Glucose, UA: NEGATIVE mg/dL
Ketones, ur: NEGATIVE mg/dL
Nitrite: NEGATIVE
Protein, ur: NEGATIVE mg/dL
Specific Gravity, Urine: 1.005 (ref 1.005–1.030)
WBC, UA: >50 WBC/hpf (ref 0–5)
pH: 7 (ref 5.0–8.0)

## 2023-01-05 LAB — CBC WITH DIFFERENTIAL/PLATELET
Abs Immature Granulocytes: 0.09 10*3/uL — ABNORMAL HIGH (ref 0.00–0.07)
Basophils Absolute: 0.1 10*3/uL (ref 0.0–0.1)
Basophils Relative: 0 %
Eosinophils Absolute: 0.2 10*3/uL (ref 0.0–0.5)
Eosinophils Relative: 2 %
HCT: 43.4 % (ref 39.0–52.0)
Hemoglobin: 15.2 g/dL (ref 13.0–17.0)
Immature Granulocytes: 1 %
Lymphocytes Relative: 16 %
Lymphs Abs: 2.1 10*3/uL (ref 0.7–4.0)
MCH: 33.9 pg (ref 26.0–34.0)
MCHC: 35 g/dL (ref 30.0–36.0)
MCV: 96.9 fL (ref 80.0–100.0)
Monocytes Absolute: 1.2 10*3/uL — ABNORMAL HIGH (ref 0.1–1.0)
Monocytes Relative: 9 %
Neutro Abs: 9.7 10*3/uL — ABNORMAL HIGH (ref 1.7–7.7)
Neutrophils Relative %: 72 %
Platelets: 309 10*3/uL (ref 150–400)
RBC: 4.48 MIL/uL (ref 4.22–5.81)
RDW: 12.3 % (ref 11.5–15.5)
WBC: 13.4 10*3/uL — ABNORMAL HIGH (ref 4.0–10.5)
nRBC: 0 % (ref 0.0–0.2)

## 2023-01-05 LAB — MAGNESIUM: Magnesium: 1.8 mg/dL (ref 1.7–2.4)

## 2023-01-05 LAB — BRAIN NATRIURETIC PEPTIDE: B Natriuretic Peptide: 81.3 pg/mL (ref 0.0–100.0)

## 2023-01-05 MED ORDER — TAMSULOSIN HCL 0.4 MG PO CAPS
0.4000 mg | ORAL_CAPSULE | Freq: Every day | ORAL | Status: DC
  Administered 2023-01-06: 0.4 mg via ORAL
  Filled 2023-01-05: qty 1

## 2023-01-05 MED ORDER — ATORVASTATIN CALCIUM 10 MG PO TABS
20.0000 mg | ORAL_TABLET | Freq: Every day | ORAL | Status: DC
  Administered 2023-01-06: 20 mg via ORAL
  Filled 2023-01-05: qty 2

## 2023-01-05 MED ORDER — DILTIAZEM LOAD VIA INFUSION
20.0000 mg | Freq: Once | INTRAVENOUS | Status: AC
  Administered 2023-01-05: 20 mg via INTRAVENOUS
  Filled 2023-01-05: qty 20

## 2023-01-05 MED ORDER — ONDANSETRON HCL 4 MG PO TABS: 4.0000 mg | ORAL_TABLET | Freq: Four times a day (QID) | ORAL | Status: DC | PRN

## 2023-01-05 MED ORDER — FLUTICASONE PROPIONATE 50 MCG/ACT NA SUSP
1.0000 | Freq: Every day | NASAL | Status: DC
  Filled 2023-01-05: qty 16

## 2023-01-05 MED ORDER — CYCLOBENZAPRINE HCL 10 MG PO TABS: 10.0000 mg | ORAL_TABLET | Freq: Three times a day (TID) | ORAL | Status: DC | PRN

## 2023-01-05 MED ORDER — TRAZODONE HCL 50 MG PO TABS
25.0000 mg | ORAL_TABLET | Freq: Every evening | ORAL | Status: DC | PRN
  Filled 2023-01-05: qty 1

## 2023-01-05 MED ORDER — ACETAMINOPHEN 650 MG RE SUPP: 650.0000 mg | Freq: Four times a day (QID) | RECTAL | Status: DC | PRN

## 2023-01-05 MED ORDER — METOPROLOL TARTRATE 25 MG PO TABS: 25.0000 mg | ORAL_TABLET | Freq: Two times a day (BID) | ORAL | Status: DC

## 2023-01-05 MED ORDER — APIXABAN 5 MG PO TABS
5.0000 mg | ORAL_TABLET | Freq: Two times a day (BID) | ORAL | Status: DC
  Administered 2023-01-05 – 2023-01-06 (×3): 5 mg via ORAL
  Filled 2023-01-05 (×3): qty 1

## 2023-01-05 MED ORDER — FUROSEMIDE 40 MG PO TABS
20.0000 mg | ORAL_TABLET | Freq: Every day | ORAL | Status: DC
  Administered 2023-01-06: 20 mg via ORAL
  Filled 2023-01-05: qty 1

## 2023-01-05 MED ORDER — ALBUTEROL SULFATE (2.5 MG/3ML) 0.083% IN NEBU: 2.5000 mg | INHALATION_SOLUTION | RESPIRATORY_TRACT | Status: DC | PRN

## 2023-01-05 MED ORDER — DILTIAZEM HCL-DEXTROSE 125-5 MG/125ML-% IV SOLN (PREMIX)
5.0000 mg/h | INTRAVENOUS | Status: DC
  Administered 2023-01-05 – 2023-01-06 (×2): 5 mg/h via INTRAVENOUS
  Filled 2023-01-05 (×2): qty 125

## 2023-01-05 MED ORDER — POTASSIUM CHLORIDE CRYS ER 20 MEQ PO TBCR
40.0000 meq | EXTENDED_RELEASE_TABLET | Freq: Once | ORAL | Status: AC
  Administered 2023-01-05: 40 meq via ORAL
  Filled 2023-01-05: qty 2

## 2023-01-05 MED ORDER — FINASTERIDE 5 MG PO TABS
5.0000 mg | ORAL_TABLET | Freq: Every day | ORAL | Status: DC
  Administered 2023-01-06: 5 mg via ORAL
  Filled 2023-01-05: qty 1

## 2023-01-05 MED ORDER — FENTANYL CITRATE PF 50 MCG/ML IJ SOSY
100.0000 ug | PREFILLED_SYRINGE | Freq: Once | INTRAMUSCULAR | Status: AC
  Administered 2023-01-05: 100 ug via INTRAVENOUS
  Filled 2023-01-05: qty 2

## 2023-01-05 MED ORDER — HYDROCODONE-ACETAMINOPHEN 5-325 MG PO TABS: 1.0000 | ORAL_TABLET | ORAL | Status: DC | PRN

## 2023-01-05 MED ORDER — SERTRALINE HCL 50 MG PO TABS
100.0000 mg | ORAL_TABLET | Freq: Every day | ORAL | Status: DC
  Administered 2023-01-06: 100 mg via ORAL
  Filled 2023-01-05: qty 2

## 2023-01-05 MED ORDER — AMLODIPINE BESYLATE 5 MG PO TABS: 5.0000 mg | ORAL_TABLET | Freq: Every day | ORAL | Status: DC

## 2023-01-05 MED ORDER — ONDANSETRON HCL 4 MG/2ML IJ SOLN: 4.0000 mg | Freq: Four times a day (QID) | INTRAMUSCULAR | Status: DC | PRN

## 2023-01-05 MED ORDER — METOPROLOL TARTRATE 5 MG/5ML IV SOLN: 5.0000 mg | Freq: Four times a day (QID) | INTRAVENOUS | Status: DC | PRN

## 2023-01-05 MED ORDER — ACETAMINOPHEN 325 MG PO TABS: 650.0000 mg | ORAL_TABLET | Freq: Four times a day (QID) | ORAL | Status: DC | PRN

## 2023-01-05 MED ORDER — LOSARTAN POTASSIUM 25 MG PO TABS: 100.0000 mg | ORAL_TABLET | Freq: Every day | ORAL | Status: DC

## 2023-01-05 NOTE — H&P (Signed)
History and Physical  Calvin [name] WGN:[id number] DOB: 08/24/1945 DOA: 01/05/2023  PCP: [name], MD   Chief Complaint: A-fib  HPI: Calvin [name] is a 77 y.o. male with medical history significant for ischemic stroke in late 2023, hypertension, BPH and neurogenic bladder with suprapubic tube placement on 12/25/2022 who presented to the emergency department with painful urinary retention, and was found to be in new onset atrial fibrillation.  He has had a lot of issues with sediment and Foley catheter blockage, for which reason he had suprapubic catheter placed earlier this month.  Once again it got clogged, and stopped draining in the early hours of this morning and he developed painful urinary retention.  In the emergency department, he was seen by urology who exchanged his suprapubic catheter at the bedside and his bladder is not draining well.  On arrival to the emergency department, he was noted to be hypertensive and tachycardic, however EKG showed presence of atrial fibrillation with rapid ventricular response.  He was given IV Cardizem bolus and drip, and hospitalist was contacted for admission.  In the meantime, his blood pressure has significantly improved in fact in the last 30 minutes he has become relatively hypotensive his heart rate is about 104.  He denies any current chest pain, dizziness, presyncope, fevers, chills, nausea, vomiting and specifically denies all of the symptoms anytime in the recent past as well.  Notably, lab work was also done and significant for WBC 13, and potassium 3.4 but otherwise unremarkable with normal renal function normal BNP.  Review of Systems: Please see HPI for pertinent positives and negatives. A complete 10 system review of systems are otherwise negative.  Past Medical History:  Diagnosis Date   Anxiety    Benign prostatic hypertrophy    COVID-19 virus infection 02/24/2020   Depression    ED (erectile dysfunction)    Headache(784.0)     History of kidney stones    Hypertension    Hypogonadism male    Low back pain 06/09/2020   Nephrolithiasis    hx of had lithotripsy in 1-10.   NEPHROLITHIASIS, HX OF 10/25/2008   Qualifier: Diagnosis of   By: Clent Ridges MD, Tera Mater      Neuromuscular disorder [facility])    neuropaty legs   Pneumonia    Pyelonephritis 02/25/2022   Stroke [facility])    fall 2023 some memory issues like time of day   Past Surgical History:  Procedure Laterality Date   colonoscopy  10/05/2020   per Dr. Russella Dar, adenomatous polyps, repeat in 3 yrs   CYSTOSCOPY W/ RETROGRADES Bilateral 12/25/2022   Procedure: CYSTOSCOPY, TRANSURETHERAL RESECTION OF PROSTATE;  Surgeon: Noel Christmas, MD;  Location: [facility];  Service: Urology;  Laterality: Bilateral;  90 MINS FRO CASE   FOOT SURGERY     INSERTION OF SUPRAPUBIC CATHETER N/A 12/25/2022   Procedure: INSERTION OF SUPRAPUBIC CATHETER;  Surgeon: Noel Christmas, MD;  Location: [facility];  Service: Urology;  Laterality: N/A;   LAPAROSCOPIC APPENDECTOMY  03/12/2012   Procedure: APPENDECTOMY LAPAROSCOPIC;  Surgeon: Shelly Rubenstein, MD;  Location: [facility];  Service: General;  Laterality: N/A;   STAPLE HEMORRHOIDECTOMY  04/14/2010   per Dr. Michaell Cowing    TONSILLECTOMY      Social History:  reports that he has never smoked. He has never used smokeless tobacco. He reports that he does not currently use alcohol after a past usage of about 14.0 standard drinks of alcohol per week. He reports that  he does not use drugs.   No Known Allergies  Family History  Problem Relation Age of Onset   Heart Problems Mother        Caused by a Virus when she was a teen   Stroke Father    Prostate cancer Father    Hypertension Other    Stroke Other    Heart disease Other        rheumatic    Coronary artery disease Other    Healthy Child    Colon cancer Neg Hx    Colon polyps Neg Hx    Esophageal cancer Neg Hx    Rectal cancer Neg Hx    Stomach cancer Neg Hx      Prior to Admission  medications   Medication Sig Start Date End Date Taking? Authorizing Provider  amLODipine (NORVASC) 5 MG tablet TAKE 1 TABLET (5 MG TOTAL) BY MOUTH DAILY. 05/03/22   [name], MD  aspirin EC 81 MG tablet Take 1 tablet (81 mg total) by mouth daily. Swallow whole. 12/14/22   [name], MD  atorvastatin (LIPITOR) 20 MG tablet Take 20 mg by mouth daily. 07/30/22   [provider]  azithromycin (ZITHROMAX Z-PAK) 250 MG tablet As directed Patient taking differently: Take by mouth daily. As directed Taper 12/14/22   [name], MD  cyanocobalamin (VITAMIN B12) 1000 MCG/ML injection INJECT 1 ML (1,000 MCG TOTAL) INTO THE MUSCLE ONCE A WEEK. 03/13/22   [name], MD  cyclobenzaprine (FLEXERIL) 10 MG tablet Take 1 tablet (10 mg total) by mouth 3 (three) times daily as needed for muscle spasms. 12/14/22   [name], MD  finasteride (PROSCAR) 5 MG tablet TAKE 1 TABLET (5 MG TOTAL) BY MOUTH DAILY. 10/01/22   [name], MD  fluticasone (FLONASE) 50 MCG/ACT nasal spray Place 1 spray into both nostrils daily. 10/10/22   Omar Person, MD  folic acid (FOLVITE) 1 MG tablet Take 1 tablet (1 mg total) by mouth daily. 12/14/22   [name], MD  furosemide (LASIX) 20 MG tablet Take 1 tablet (20 mg total) by mouth daily. 12/14/22   [name], MD  hydrochlorothiazide (HYDRODIURIL) 25 MG tablet Take 1 tablet (25 mg total) by mouth daily. 12/14/22   [name], MD  HYDROcodone bit-homatropine (HYCODAN) 5-1.5 MG/5ML syrup Take 5 mLs by mouth every 4 (four) hours as needed. Patient taking differently: Take 5 mLs by mouth every 4 (four) hours as needed for cough. 12/14/22   [name], MD  HYDROcodone-acetaminophen (NORCO/VICODIN) 5-325 MG tablet Take 1 tablet by mouth every 4 (four) hours as needed for moderate pain. 12/25/22 12/25/23  Noel Christmas, MD  ketoconazole (NIZORAL) 2 % cream Apply 1 Application topically 3 (three) times daily. 11/27/22   [name], MD   KLOR-CON M10 10 MEQ tablet Take 1 tablet (10 mEq total) by mouth 2 (two) times daily. 12/14/22   [name], MD  loperamide (IMODIUM) 2 MG capsule Take 1 capsule (2 mg total) by mouth every 8 (eight) hours as needed for diarrhea or loose stools. 12/14/22   [name], MD  losartan (COZAAR) 100 MG tablet TAKE 1 TABLET BY MOUTH EVERY DAY 05/03/22   [name], MD  Multiple Vitamin (MULTIVITAMIN WITH MINERALS) TABS tablet Take 1 tablet by mouth daily. 06/01/22   Sheikh, Omair Latif, DO  ondansetron (ZOFRAN) 4 MG tablet Take 1 tablet (4 mg total) by mouth  every 8 (eight) hours as needed for nausea or vomiting. 12/14/22   [name], MD  sertraline (ZOLOFT) 100 MG tablet TAKE 1 TABLET BY MOUTH EVERY DAY 01/04/23   [name], MD  SYRINGE-NEEDLE, DISP, 3 ML (BD ECLIPSE SYRINGE/NEEDLE) 25G X 5/8" 3 ML MISC Use as directed once a week 12/14/22   [name], MD  tamsulosin (FLOMAX) 0.4 MG CAPS capsule TAKE 1 CAPSULE BY MOUTH EVERY DAY 12/14/22   [name], MD  thiamine (VITAMIN B-1) 100 MG tablet Take 1 tablet (100 mg total) by mouth daily. 12/14/22   [name], MD    Physical Exam: BP (!) 103/57   Pulse (!) 104   Temp 98.7 F (37.1 C) (Oral)   Resp (!) 22   Ht 6' (1.829 m)   Wt 113 kg   SpO2 93%   BMI 33.79 kg/m   General:  Alert, oriented, calm, in no acute distress  Eyes: EOMI, clear conjuctivae, white sclerea Neck: supple, no masses, trachea mildline  Cardiovascular: Irregularly irregular and tachycardic, no murmurs or rubs, no peripheral edema  Respiratory: clear to auscultation bilaterally, no wheezes, no crackles  Abdomen: soft, nontender, nondistended, normal bowel tones heard  Skin: dry, no rashes  Musculoskeletal: no joint effusions, normal range of motion  Psychiatric: appropriate affect, normal speech  Neurologic: extraocular muscles intact, clear speech, moving all extremities with intact sensorium          Labs on Admission:  Basic Metabolic  Panel: Recent Labs  Lab 01/05/23 1309  NA 135  K 3.4*  CL 100  CO2 22  GLUCOSE 139*  BUN 15  CREATININE 0.85  CALCIUM 9.4  MG 1.8   Liver Function Tests: No results for input(s): "AST", "ALT", "ALKPHOS", "BILITOT", "PROT", "ALBUMIN" in the last 168 hours. No results for input(s): "LIPASE", "AMYLASE" in the last 168 hours. No results for input(s): "AMMONIA" in the last 168 hours. CBC: Recent Labs  Lab 01/05/23 1309  WBC 13.4*  NEUTROABS 9.7*  HGB 15.2  HCT 43.4  MCV 96.9  PLT 309   Cardiac Enzymes: No results for input(s): "CKTOTAL", "CKMB", "CKMBINDEX", "TROPONINI" in the last 168 hours.  BNP (last 3 results) Recent Labs    08/09/22 1558 01/05/23 1309  BNP 94.6 81.3    ProBNP (last 3 results) No results for input(s): "PROBNP" in the last 8760 hours.  CBG: No results for input(s): "GLUCAP" in the last 168 hours.  Radiological Exams on Admission: No results found.  Assessment/Plan This is a pleasant 77 year old gentleman with a prior history of ischemic stroke in late 2023, hypertension, hyperlipidemia, chronic urinary retention with suprapubic Foley catheter who is being admitted to the hospital with new onset asymptomatic atrial fibrillation with RVR.  Atrial fibrillation with RVR-interestingly, he had significant inpatient workup including multiple EKGs, as well as outpatient echocardiogram and cardiology consultation since his ischemic stroke, but no A-fib was documented.  Unclear whether this is paroxysmal, or any new problem especially since it is asymptomatic. -Inpatient admission to telemetry -Continuous telemetry monitoring -Continue IV Cardizem drip, may need to titrate down for hypertension -Discussed with Dr. Royann Shivers, heart care will consult in the morning -Start anticoagulation with Eliquis 5 mg p.o. twice daily -He had a recent echo about 6 months ago, we will leave it to cardiology whether this study should be repeated -Will maintain K greater  than 4, magnesium greater than 2; give KCl 40 mEq p.o. now, and check magnesium  Hypokalemia-mild, but  repleted as above  Chronic urinary retention-due to BPH and neurogenic flaccid bladder, suprapubic catheter was replaced in the ER by urology today 7/20.  Continue home Flomax.  Essential hypertension-will hold his home amlodipine and losartan for now  Depression-Zoloft  Leukocytosis-noted, possible demargination due to stress from urinary retention, patient denies any nausea, vomiting, fevers, chills, dysuria, pelvic discomfort so will not workup further for infection  Hyperlipidemia-Lipitor  DVT prophylaxis: Starting Eliquis    Code Status: Full Code  Consults called: [place] [facility]  Admission status: The appropriate patient status for this patient is INPATIENT. Inpatient status is judged to be reasonable and necessary in order to provide the required intensity of service to ensure the patient's safety. The patient's presenting symptoms, physical exam findings, and initial radiographic and laboratory data in the context of their chronic comorbidities is felt to place them at high risk for further clinical deterioration. Furthermore, it is not anticipated that the patient will be medically stable for discharge from the hospital within 2 midnights of admission.    I certify that at the point of admission it is my clinical judgment that the patient will require inpatient hospital care spanning beyond 2 midnights from the point of admission due to high intensity of service, high risk for further deterioration and high frequency of surveillance required  Time spent: 53 minutes  Calvin [name] Sharlette Dense MD [facility] Pager [phone]  If 7PM-7AM, please contact night-coverage [email or web address] Password [email or web address]  01/05/2023, 3:21 PM

## 2023-01-05 NOTE — ED Notes (Signed)
Lab called to add BNP to previous labs.

## 2023-01-05 NOTE — ED Notes (Signed)
Attempted to flush catheter with no success. EDP at bedside

## 2023-01-05 NOTE — ED Notes (Signed)
ED TO INPATIENT HANDOFF REPORT  ED Nurse Name and Phone #: [email or web address]   S Name/Age/Gender Calvin [name] 77 y.o. male Room/Bed: WA23/WA23  Code Status   Code Status: Full Code  Home/SNF/Other Home Patient oriented to: self, place, time, and situation Is this baseline? Yes   Triage Complete: Triage complete  Chief Complaint Atrial fibrillation with rapid ventricular response [facility]) [I48.91]  Triage Note Pt reports surgery for suprapubic catheter on 7/9. Reports it is clogged since last night. Pt took imodium 2 days ago for soft stools. Pt took daily meds already.   Allergies No Known Allergies  Level of Care/Admitting Diagnosis ED Disposition     ED Disposition  Admit   Condition  --   Comment  Hospital [facility] [[id number]]  Level of Care: Telemetry [5]  Admit to tele based on following criteria: Complex arrhythmia (Bradycardia/Tachycardia)  May admit patient to [facility] or [facility] if equivalent level of care is available:: Yes  Covid Evaluation: Asymptomatic - no recent exposure (last 10 days) testing not required  Diagnosis: Atrial fibrillation with rapid ventricular response [facility]) [[id number]]  Admitting Physician: Maryln Gottron [[id number]]  Attending Physician: Olexa.Dam, MIR Jaxson.Roy [[id number]]  Certification:: I certify this patient will need inpatient services for at least 2 midnights  Estimated Length of Stay: 3          B Medical/Surgery History Past Medical History:  Diagnosis Date   Anxiety    Benign prostatic hypertrophy    COVID-19 virus infection 02/24/2020   Depression    ED (erectile dysfunction)    Headache(784.0)    History of kidney stones    Hypertension    Hypogonadism male    Low back pain 06/09/2020   Nephrolithiasis    hx of had lithotripsy in 1-10.   NEPHROLITHIASIS, HX OF 10/25/2008   Qualifier: Diagnosis of   By: Clent Ridges MD, Tera Mater      Neuromuscular disorder [facility])    neuropaty legs   Pneumonia     Pyelonephritis 02/25/2022   Stroke [facility])    fall 2023 some memory issues like time of day   Past Surgical History:  Procedure Laterality Date   colonoscopy  10/05/2020   per Dr. Russella Dar, adenomatous polyps, repeat in 3 yrs   CYSTOSCOPY W/ RETROGRADES Bilateral 12/25/2022   Procedure: CYSTOSCOPY, TRANSURETHERAL RESECTION OF PROSTATE;  Surgeon: Noel Christmas, MD;  Location: [facility];  Service: Urology;  Laterality: Bilateral;  90 MINS FRO CASE   FOOT SURGERY     INSERTION OF SUPRAPUBIC CATHETER N/A 12/25/2022   Procedure: INSERTION OF SUPRAPUBIC CATHETER;  Surgeon: Noel Christmas, MD;  Location: [facility];  Service: Urology;  Laterality: N/A;   LAPAROSCOPIC APPENDECTOMY  03/12/2012   Procedure: APPENDECTOMY LAPAROSCOPIC;  Surgeon: Shelly Rubenstein, MD;  Location: [facility] OR;  Service: General;  Laterality: N/A;   STAPLE HEMORRHOIDECTOMY  04/14/2010   per Dr. Michaell Cowing    TONSILLECTOMY       A IV Location/Drains/Wounds Patient Lines/Drains/Airways Status     Active Line/Drains/Airways     Name Placement date Placement time Site Days   Peripheral IV 01/05/23 20 G Anterior;Distal;Right;Upper Arm 01/05/23  1309  Arm  less than 1   Urethral Catheter Bosie Clos M.-EMT Non-latex;Straight-tip 16 Fr. 07/17/22  0822  Non-latex;Straight-tip  172   Suprapubic Catheter Latex 16 Fr. 12/25/22  1208  Latex  11   Pressure Injury 05/28/22 Coccyx Medial Stage 1 -  Intact skin with non-blanchable redness  of a localized area usually over a bony prominence. 05/28/22  2009  -- 222            Intake/Output Last 24 hours  Intake/Output Summary (Last 24 hours) at 01/05/2023 1535 Last data filed at 01/05/2023 1443 Gross per 24 hour  Intake --  Output 1500 ml  Net -1500 ml    Labs/Imaging Results for orders placed or performed during the hospital encounter of 01/05/23 (from the past 48 hour(s))  CBC with Differential/Platelet     Status: Abnormal   Collection Time: 01/05/23  1:09 PM  Result Value Ref Range    WBC 13.4 (H) 4.0 - 10.5 K/uL   RBC 4.48 4.22 - 5.81 MIL/uL   Hemoglobin 15.2 13.0 - 17.0 g/dL   HCT 95.2 84.1 - 32.4 %   MCV 96.9 80.0 - 100.0 fL   MCH 33.9 26.0 - 34.0 pg   MCHC 35.0 30.0 - 36.0 g/dL   RDW 40.1 02.7 - 25.3 %   Platelets 309 150 - 400 K/uL   nRBC 0.0 0.0 - 0.2 %   Neutrophils Relative % 72 %   Neutro Abs 9.7 (H) 1.7 - 7.7 K/uL   Lymphocytes Relative 16 %   Lymphs Abs 2.1 0.7 - 4.0 K/uL   Monocytes Relative 9 %   Monocytes Absolute 1.2 (H) 0.1 - 1.0 K/uL   Eosinophils Relative 2 %   Eosinophils Absolute 0.2 0.0 - 0.5 K/uL   Basophils Relative 0 %   Basophils Absolute 0.1 0.0 - 0.1 K/uL   Immature Granulocytes 1 %   Abs Immature Granulocytes 0.09 (H) 0.00 - 0.07 K/uL    Comment: Performed at [facility], [address] [place], [place] [address]  Basic metabolic panel     Status: Abnormal   Collection Time: 01/05/23  1:09 PM  Result Value Ref Range   Sodium 135 135 - 145 mmol/L   Potassium 3.4 (L) 3.5 - 5.1 mmol/L   Chloride 100 98 - 111 mmol/L   CO2 22 22 - 32 mmol/L   Glucose, Bld 139 (H) 70 - 99 mg/dL    Comment: Glucose reference range applies only to samples taken after fasting for at least 8 hours.   BUN 15 8 - 23 mg/dL   Creatinine, Ser 3.47 0.61 - 1.24 mg/dL   Calcium 9.4 8.9 - 42.5 mg/dL   GFR, Estimated >95 >63 mL/min    Comment: (NOTE) Calculated using the CKD-EPI Creatinine Equation (2021)    Anion gap 13 5 - 15    Comment: Performed at [facility], [address], [place], [place] [address]  Brain natriuretic peptide     Status: None   Collection Time: 01/05/23  1:09 PM  Result Value Ref Range   B Natriuretic Peptide 81.3 0.0 - 100.0 pg/mL    Comment: Performed at [facility], [address] [place], [place] [address]  Magnesium     Status: None   Collection Time: 01/05/23  1:09 PM  Result Value Ref Range   Magnesium 1.8 1.7 - 2.4 mg/dL    Comment: Performed at [facility], [address], [place], [place] [address]  Urinalysis, Routine w reflex microscopic -Urine, Unspecified Source     Status: Abnormal   Collection Time: 01/05/23  2:50 PM  Result Value Ref Range   Color, Urine YELLOW YELLOW   APPearance CLOUDY (A) CLEAR   Specific Gravity, Urine 1.005 1.005 - 1.030  pH 7.0 5.0 - 8.0   Glucose, UA NEGATIVE NEGATIVE mg/dL   Hgb urine dipstick LARGE (A) NEGATIVE   Bilirubin Urine NEGATIVE NEGATIVE   Ketones, ur NEGATIVE NEGATIVE mg/dL   Protein, ur NEGATIVE NEGATIVE mg/dL   Nitrite NEGATIVE NEGATIVE   Leukocytes,Ua LARGE (A) NEGATIVE   RBC / HPF 21-50 0 - 5 RBC/hpf   WBC, UA >50 0 - 5 WBC/hpf   Bacteria, UA MANY (A) NONE SEEN   Squamous Epithelial / HPF 0-5 0 - 5 /HPF   Mucus PRESENT    Amorphous Crystal PRESENT     Comment: Performed at [facility], [address], [place], [place] [address]   DG Chest Port 1 View  Result Date: 01/05/2023 CLINICAL DATA:  afib EXAM: PORTABLE CHEST 1 VIEW COMPARISON:  CXR 05/27/22 FINDINGS: Low lung volumes. No pleural effusion. No pneumothorax. Likely unchanged cardiac and mediastinal contours when accounting for differences in lung volume. Focal airspace opacity. There are prominent bilateral interstitial opacities could represent pulmonary venous congestion. No radiographically apparent displaced rib fractures. Visualized upper abdomen is unremarkable. IMPRESSION: Prominent bilateral interstitial opacities could represent pulmonary venous congestion. Electronically Signed   By: Lorenza Cambridge M.D.   On: 01/05/2023 15:19    Pending Labs Unresulted Labs (From admission, onward)     Start     Ordered   01/06/23 0500  Basic metabolic panel  Tomorrow morning,   R        01/05/23 1504   01/06/23 0500  CBC  Tomorrow morning,   R        01/05/23 1504            Vitals/Pain Today's Vitals   01/05/23 1451 01/05/23 1510 01/05/23 1515 01/05/23 1530  BP:  (!) 103/57 107/74 109/83   Pulse:  (!) 104 95 89  Resp:  (!) 22 (!) 21 19  Temp:      TempSrc:      SpO2:  93% 93% 91%  Weight:      Height:      PainSc: 0-No pain 0-No pain      Isolation Precautions No active isolations  Medications Medications  diltiazem (CARDIZEM) 1 mg/mL load via infusion 20 mg (20 mg Intravenous Bolus from Bag 01/05/23 1450)    And  diltiazem (CARDIZEM) 125 mg in dextrose 5% 125 mL (1 mg/mL) infusion (5 mg/hr Intravenous New Bag/Given 01/05/23 1450)  potassium chloride SA (KLOR-CON M) CR tablet 40 mEq (has no administration in time range)  HYDROcodone-acetaminophen (NORCO/VICODIN) 5-325 MG per tablet 1 tablet (has no administration in time range)  atorvastatin (LIPITOR) tablet 20 mg (has no administration in time range)  furosemide (LASIX) tablet 20 mg (has no administration in time range)  sertraline (ZOLOFT) tablet 100 mg (has no administration in time range)  finasteride (PROSCAR) tablet 5 mg (has no administration in time range)  tamsulosin (FLOMAX) capsule 0.4 mg (has no administration in time range)  cyclobenzaprine (FLEXERIL) tablet 10 mg (has no administration in time range)  fluticasone (FLONASE) 50 MCG/ACT nasal spray 1 spray (has no administration in time range)  acetaminophen (TYLENOL) tablet 650 mg (has no administration in time range)    Or  acetaminophen (TYLENOL) suppository 650 mg (has no administration in time range)  traZODone (DESYREL) tablet 25 mg (has no administration in time range)  ondansetron (ZOFRAN) tablet 4 mg (has no administration in time range)    Or  ondansetron (ZOFRAN) injection 4 mg (has no administration in time range)  albuterol (PROVENTIL) (2.5 MG/3ML) 0.083% nebulizer solution 2.5 mg (has no administration in time range)  metoprolol tartrate (LOPRESSOR) injection 5 mg (has no administration in time range)  apixaban (ELIQUIS) tablet 5 mg (has no administration in time range)  fentaNYL (SUBLIMAZE) injection 100 mcg (100 mcg Intravenous Given  01/05/23 1353)    Mobility walks with person assist     Focused Assessments Cardiac Assessment Handoff:    Lab Results  Component Value Date   CKTOTAL 49 05/27/2022   No results found for: "DDIMER" Does the Patient currently have chest pain? No    R Recommendations: See Admitting Provider Note  Report given to:   Additional Notes:  Pt came in for foley clogged, urologist changed suprapubic today. New Afib RVR and placed on cardizem (currently at 5 mg/hr)

## 2023-01-05 NOTE — Plan of Care (Signed)

## 2023-01-05 NOTE — Consult Note (Signed)
Consultation: Nondraining suprapubic tube, urinary retention Requested by: Dr. Lorre Nick  History of Present Illness: Calvin [name] is a 77 year old patient of Dr. Arita Miss who underwent suprapubic tube placement in the OR on 12/25/2022.  He has had issues with continued urinary retention and a lot of sediment with his catheters.  His catheter quit draining last night and he has had painful urinary retention.  Suprapubic tube could not be irrigated and urology was requested to exchange given it was placed recently.  Past Medical History:  Diagnosis Date   Anxiety    Benign prostatic hypertrophy    COVID-19 virus infection 02/24/2020   Depression    ED (erectile dysfunction)    Headache(784.0)    History of kidney stones    Hypertension    Hypogonadism male    Low back pain 06/09/2020   Nephrolithiasis    hx of had lithotripsy in 1-10.   NEPHROLITHIASIS, HX OF 10/25/2008   Qualifier: Diagnosis of   By: Clent Ridges MD, Tera Mater      Neuromuscular disorder [facility])    neuropaty legs   Pneumonia    Pyelonephritis 02/25/2022   Stroke [facility])    fall 2023 some memory issues like time of day   Past Surgical History:  Procedure Laterality Date   colonoscopy  10/05/2020   per Dr. Russella Dar, adenomatous polyps, repeat in 3 yrs   CYSTOSCOPY W/ RETROGRADES Bilateral 12/25/2022   Procedure: CYSTOSCOPY, TRANSURETHERAL RESECTION OF PROSTATE;  Surgeon: Noel Christmas, MD;  Location: [facility];  Service: Urology;  Laterality: Bilateral;  90 MINS FRO CASE   FOOT SURGERY     INSERTION OF SUPRAPUBIC CATHETER N/A 12/25/2022   Procedure: INSERTION OF SUPRAPUBIC CATHETER;  Surgeon: Noel Christmas, MD;  Location: [facility];  Service: Urology;  Laterality: N/A;   LAPAROSCOPIC APPENDECTOMY  03/12/2012   Procedure: APPENDECTOMY LAPAROSCOPIC;  Surgeon: Shelly Rubenstein, MD;  Location: [facility] OR;  Service: General;  Laterality: N/A;   STAPLE HEMORRHOIDECTOMY  04/14/2010   per Dr. Michaell Cowing    TONSILLECTOMY      Home Medications:  (Not  in a hospital admission)  Allergies: No Known Allergies  Family History  Problem Relation Age of Onset   Heart Problems Mother        Caused by a Virus when she was a teen   Stroke Father    Prostate cancer Father    Hypertension Other    Stroke Other    Heart disease Other        rheumatic    Coronary artery disease Other    Healthy Child    Colon cancer Neg Hx    Colon polyps Neg Hx    Esophageal cancer Neg Hx    Rectal cancer Neg Hx    Stomach cancer Neg Hx    Social History:  reports that he has never smoked. He has never used smokeless tobacco. He reports that he does not currently use alcohol after a past usage of about 14.0 standard drinks of alcohol per week. He reports that he does not use drugs.  ROS: A complete review of systems was performed.  All systems are negative except for pertinent findings as noted. Review of Systems  All other systems reviewed and are negative.    Physical Exam:  Vital signs in last 24 hours: Temp:  [98.7 F (37.1 C)] 98.7 F (37.1 C) (07/20 1243) Pulse Rate:  [114-137] 115 (07/20 1400) Resp:  [15-30] 22 (07/20 1400) BP: (149-196)/(112-118) 149/112 (07/20 1400)  SpO2:  [90 %-97 %] 90 % (07/20 1400) Weight:  [161 kg] 113 kg (07/20 1242) General:  Alert and oriented, No acute distress HEENT: Normocephalic, atraumatic Cardiovascular: Irregular rate and rhythm Lungs: Regular rate and effort Abdomen: Soft, nontender, bladder distended, no abdominal masses, SP tract maturing well with typical healing erythema around the tract. Sutures to narrow the tract in place and C/D/I.  Back: No CVA tenderness Extremities: No edema Neurologic: Grossly intact  Procedure: I tried to irrigate the catheter and could not inject any water. I tried to pass an angled zip wire through the existing suprapubic tube but it was too encrusted to allow wire passage.  Therefore the balloon was deflated and the suprapubic tube removed without difficulty.  Urine  began to drain from the suprapubic tube tract.  I put a finger on it to hold urine in the bladder.  I then did a quick prep with betadine and passed an 81 French Foley catheter without difficulty into the bladder.  The balloon was inflated and seated at the bladder wall.  He drained about 2 L of urine.  Abdominal pain resolved.  There was some sediment in the urine and the catheter was irrigated without difficulty.  Irrigated to clear.  Laboratory Data:  Results for orders placed or performed during the hospital encounter of 01/05/23 (from the past 24 hour(s))  CBC with Differential/Platelet     Status: Abnormal   Collection Time: 01/05/23  1:09 PM  Result Value Ref Range   WBC 13.4 (H) 4.0 - 10.5 K/uL   RBC 4.48 4.22 - 5.81 MIL/uL   Hemoglobin 15.2 13.0 - 17.0 g/dL   HCT 09.6 04.5 - 40.9 %   MCV 96.9 80.0 - 100.0 fL   MCH 33.9 26.0 - 34.0 pg   MCHC 35.0 30.0 - 36.0 g/dL   RDW 81.1 91.4 - 78.2 %   Platelets 309 150 - 400 K/uL   nRBC 0.0 0.0 - 0.2 %   Neutrophils Relative % 72 %   Neutro Abs 9.7 (H) 1.7 - 7.7 K/uL   Lymphocytes Relative 16 %   Lymphs Abs 2.1 0.7 - 4.0 K/uL   Monocytes Relative 9 %   Monocytes Absolute 1.2 (H) 0.1 - 1.0 K/uL   Eosinophils Relative 2 %   Eosinophils Absolute 0.2 0.0 - 0.5 K/uL   Basophils Relative 0 %   Basophils Absolute 0.1 0.0 - 0.1 K/uL   Immature Granulocytes 1 %   Abs Immature Granulocytes 0.09 (H) 0.00 - 0.07 K/uL  Basic metabolic panel     Status: Abnormal   Collection Time: 01/05/23  1:09 PM  Result Value Ref Range   Sodium 135 135 - 145 mmol/L   Potassium 3.4 (L) 3.5 - 5.1 mmol/L   Chloride 100 98 - 111 mmol/L   CO2 22 22 - 32 mmol/L   Glucose, Bld 139 (H) 70 - 99 mg/dL   BUN 15 8 - 23 mg/dL   Creatinine, Ser 9.56 0.61 - 1.24 mg/dL   Calcium 9.4 8.9 - 21.3 mg/dL   GFR, Estimated >08 >65 mL/min   Anion gap 13 5 - 15   No results found for this or any previous visit (from the past 240 hour(s)). Creatinine: Recent Labs     01/05/23 1309  CREATININE 0.85    Impression/Assessment:  Urinary retention-encrusted suprapubic tube.  Successfully exchanged to an 53 French suprapubic tube. Patient tolerated exchange and had no chest pain or shortness of breath.  Abdominal pain  resolved.  Plan:  Continue suprapubic tube.  Follow-up with Dr. Arita Miss as planned.    Jerilee Field 01/05/2023, 2:59 PM

## 2023-01-05 NOTE — Plan of Care (Signed)

## 2023-01-05 NOTE — ED Provider Notes (Signed)
[place] EMERGENCY DEPARTMENT AT [facility] Provider Note   CSN: [id number] Arrival date & time: 01/05/23  1237     History  Chief Complaint  Patient presents with   foley catheter problem    Calvin [name] is a 77 y.o. male.  77 year old male presents with urinary retention.  Has a history of prostate issues.  Catheter was last placed 2 weeks ago.  States that over the night his back had no drainage in it.  He endorses pressure in the suprapubic region.  Denies any flank pain or fever.       Home Medications Prior to Admission medications   Medication Sig Start Date End Date Taking? Authorizing Provider  amLODipine (NORVASC) 5 MG tablet TAKE 1 TABLET (5 MG TOTAL) BY MOUTH DAILY. 05/03/22   [name], MD  aspirin EC 81 MG tablet Take 1 tablet (81 mg total) by mouth daily. Swallow whole. 12/14/22   [name], MD  atorvastatin (LIPITOR) 20 MG tablet Take 20 mg by mouth daily. 07/30/22   [provider]  azithromycin (ZITHROMAX Z-PAK) 250 MG tablet As directed Patient taking differently: Take by mouth daily. As directed Taper 12/14/22   [name], MD  cyanocobalamin (VITAMIN B12) 1000 MCG/ML injection INJECT 1 ML (1,000 MCG TOTAL) INTO THE MUSCLE ONCE A WEEK. 03/13/22   [name], MD  cyclobenzaprine (FLEXERIL) 10 MG tablet Take 1 tablet (10 mg total) by mouth 3 (three) times daily as needed for muscle spasms. 12/14/22   [name], MD  finasteride (PROSCAR) 5 MG tablet TAKE 1 TABLET (5 MG TOTAL) BY MOUTH DAILY. 10/01/22   [name], MD  fluticasone (FLONASE) 50 MCG/ACT nasal spray Place 1 spray into both nostrils daily. 10/10/22   Omar Person, MD  folic acid (FOLVITE) 1 MG tablet Take 1 tablet (1 mg total) by mouth daily. 12/14/22   [name], MD  furosemide (LASIX) 20 MG tablet Take 1 tablet (20 mg total) by mouth daily. 12/14/22   [name], MD  hydrochlorothiazide (HYDRODIURIL) 25 MG tablet Take 1 tablet (25 mg total)  by mouth daily. 12/14/22   [name], MD  HYDROcodone bit-homatropine (HYCODAN) 5-1.5 MG/5ML syrup Take 5 mLs by mouth every 4 (four) hours as needed. Patient taking differently: Take 5 mLs by mouth every 4 (four) hours as needed for cough. 12/14/22   [name], MD  HYDROcodone-acetaminophen (NORCO/VICODIN) 5-325 MG tablet Take 1 tablet by mouth every 4 (four) hours as needed for moderate pain. 12/25/22 12/25/23  Noel Christmas, MD  ketoconazole (NIZORAL) 2 % cream Apply 1 Application topically 3 (three) times daily. 11/27/22   [name], MD  KLOR-CON M10 10 MEQ tablet Take 1 tablet (10 mEq total) by mouth 2 (two) times daily. 12/14/22   [name], MD  loperamide (IMODIUM) 2 MG capsule Take 1 capsule (2 mg total) by mouth every 8 (eight) hours as needed for diarrhea or loose stools. 12/14/22   [name], MD  losartan (COZAAR) 100 MG tablet TAKE 1 TABLET BY MOUTH EVERY DAY 05/03/22   [name], MD  Multiple Vitamin (MULTIVITAMIN WITH MINERALS) TABS tablet Take 1 tablet by mouth daily. 06/01/22   Marguerita Merles Latif, DO  ondansetron (ZOFRAN) 4 MG tablet Take 1 tablet (4 mg total) by mouth every 8 (eight) hours as needed for nausea or vomiting. 12/14/22   [name], MD  sertraline (ZOLOFT)  100 MG tablet TAKE 1 TABLET BY MOUTH EVERY DAY 01/04/23   [name], MD  SYRINGE-NEEDLE, DISP, 3 ML (BD ECLIPSE SYRINGE/NEEDLE) 25G X 5/8" 3 ML MISC Use as directed once a week 12/14/22   [name], MD  tamsulosin (FLOMAX) 0.4 MG CAPS capsule TAKE 1 CAPSULE BY MOUTH EVERY DAY 12/14/22   [name], MD  thiamine (VITAMIN B-1) 100 MG tablet Take 1 tablet (100 mg total) by mouth daily. 12/14/22   [name], MD      Allergies    Patient has no known allergies.    Review of Systems   Review of Systems  All other systems reviewed and are negative.   Physical Exam Updated Vital Signs BP (!) 196/118   Pulse (!) 114   Temp 98.7 F (37.1 C) (Oral)   Resp 15   Ht 1.829 m  (6')   Wt 113 kg   SpO2 97%   BMI 33.79 kg/m  Physical Exam Vitals and nursing note reviewed.  Constitutional:      General: He is not in acute distress.    Appearance: Normal appearance. He is well-developed. He is not toxic-appearing.  HENT:     Head: Normocephalic and atraumatic.  Eyes:     General: Lids are normal.     Conjunctiva/sclera: Conjunctivae normal.     Pupils: Pupils are equal, round, and reactive to light.  Neck:     Thyroid: No thyroid mass.     Trachea: No tracheal deviation.  Cardiovascular:     Rate and Rhythm: Normal rate and regular rhythm.     Heart sounds: Normal heart sounds. No murmur heard.    No gallop.  Pulmonary:     Effort: Pulmonary effort is normal. No respiratory distress.     Breath sounds: Normal breath sounds. No stridor. No decreased breath sounds, wheezing, rhonchi or rales.  Abdominal:     General: There is no distension.     Palpations: Abdomen is soft.     Tenderness: There is abdominal tenderness in the suprapubic area. There is no rebound.    Musculoskeletal:        General: No tenderness. Normal range of motion.     Cervical back: Normal range of motion and neck supple.  Skin:    General: Skin is warm and dry.     Findings: No abrasion or rash.  Neurological:     Mental Status: He is alert and oriented to person, place, and time. Mental status is at baseline.     GCS: GCS eye subscore is 4. GCS verbal subscore is 5. GCS motor subscore is 6.     Cranial Nerves: Cranial nerves are intact. No cranial nerve deficit.     Sensory: No sensory deficit.     Motor: Motor function is intact.  Psychiatric:        Attention and Perception: Attention normal.        Speech: Speech normal.        Behavior: Behavior normal.     ED Results / Procedures / Treatments   Labs (all labs ordered are listed, but only abnormal results are displayed) Labs Reviewed  CBC WITH DIFFERENTIAL/PLATELET  BASIC METABOLIC PANEL  URINALYSIS, ROUTINE W  REFLEX MICROSCOPIC    EKG None ED ECG REPORT   Date: 01/05/2023  Rate: 130  Rhythm: atrial fibrillation  QRS Axis: normal  Intervals: normal  ST/T Wave abnormalities: nonspecific ST changes  Conduction Disutrbances:none  Narrative  Interpretation:   Old EKG Reviewed: none available  I have personally reviewed the EKG tracing and agree with the computerized printout as noted.  Radiology No results found.  Procedures Procedures    Medications Ordered in ED Medications - No data to display  ED Course/ Medical Decision Making/ A&P                             Medical Decision Making Amount and/or Complexity of Data Reviewed Labs: ordered. Radiology: ordered.  Risk Prescription drug management.   Patient seen by urology and patient suprapubic catheter replaced..  Outflow after was irrigated by the urologist was at the bedside.  Patient did appear to be in sinus tach initially when he was here to 130s.  Even after patient's bladder was drained, patient remained tachycardic.  EKG per interpretation shows atrial fibrillation.  Patient does have a history of stroke last year.  He is only on aspirin.  They were unclear of the etiology.  Patient has been ordered Cardizem IV push along with a drip.  Patient has not had any symptoms of palpitations or tachyarrhythmia.  Patient will require admission at this time.  Family at bedside has been informed  CRITICAL CARE Performed by: Toy Baker Total critical care time: 45 minutes Critical care time was exclusive of separately billable procedures and treating other patients. Critical care was necessary to treat or prevent imminent or life-threatening deterioration. Critical care was time spent personally by me on the following activities: development of treatment plan with patient and/or surrogate as well as nursing, discussions with consultants, evaluation of patient's response to treatment, examination of patient, obtaining history  from patient or surrogate, ordering and performing treatments and interventions, ordering and review of laboratory studies, ordering and review of radiographic studies, pulse oximetry and re-evaluation of patient's condition.         Final Clinical Impression(s) / ED Diagnoses Final diagnoses:  None    Rx / DC Orders ED Discharge Orders     None         Lorre Nick, MD 01/05/23 1442

## 2023-01-05 NOTE — ED Triage Notes (Signed)
Pt reports surgery for suprapubic catheter on 7/9. Reports it is clogged since last night. Pt took imodium 2 days ago for soft stools. Pt took daily meds already.

## 2023-01-05 NOTE — Discharge Instructions (Signed)

## 2023-01-06 DIAGNOSIS — I4891 Unspecified atrial fibrillation: Secondary | ICD-10-CM | POA: Diagnosis not present

## 2023-01-06 LAB — BASIC METABOLIC PANEL
Anion gap: 9 (ref 5–15)
BUN: 11 mg/dL (ref 8–23)
CO2: 24 mmol/L (ref 22–32)
Calcium: 8.9 mg/dL (ref 8.9–10.3)
Chloride: 101 mmol/L (ref 98–111)
Creatinine, Ser: 0.81 mg/dL (ref 0.61–1.24)
GFR, Estimated: >60 mL/min (ref >60)
Glucose, Bld: 125 mg/dL — ABNORMAL HIGH (ref 70–99)
Potassium: 3.5 mmol/L (ref 3.5–5.1)
Sodium: 134 mmol/L — ABNORMAL LOW (ref 135–145)

## 2023-01-06 LAB — CBC
HCT: 39.1 % (ref 39.0–52.0)
Hemoglobin: 13.5 g/dL (ref 13.0–17.0)
MCH: 34.2 pg — ABNORMAL HIGH (ref 26.0–34.0)
MCHC: 34.5 g/dL (ref 30.0–36.0)
MCV: 99 fL (ref 80.0–100.0)
Platelets: 265 10*3/uL (ref 150–400)
RBC: 3.95 MIL/uL — ABNORMAL LOW (ref 4.22–5.81)
RDW: 12.4 % (ref 11.5–15.5)
WBC: 13 10*3/uL — ABNORMAL HIGH (ref 4.0–10.5)
nRBC: 0 % (ref 0.0–0.2)

## 2023-01-06 MED ORDER — DILTIAZEM HCL ER COATED BEADS 180 MG PO CP24: 180.0000 mg | ORAL_CAPSULE | Freq: Every day | ORAL | 3 refills | Status: AC

## 2023-01-06 MED ORDER — RIVAROXABAN 20 MG PO TABS: 20.0000 mg | ORAL_TABLET | Freq: Every day | ORAL | Status: DC

## 2023-01-06 MED ORDER — DILTIAZEM HCL ER COATED BEADS 180 MG PO CP24
180.0000 mg | ORAL_CAPSULE | Freq: Every day | ORAL | Status: DC
  Administered 2023-01-06: 180 mg via ORAL
  Filled 2023-01-06: qty 1

## 2023-01-06 MED ORDER — RIVAROXABAN 20 MG PO TABS: 20.0000 mg | ORAL_TABLET | Freq: Every day | ORAL | 2 refills | Status: DC

## 2023-01-06 NOTE — Hospital Course (Signed)
Calvin [name] is a 77 y.o. male with medical history significant for ischemic stroke 05/2022, BPH, neurogenic bladder now with suprapubic cath in place since 12/25/22 and HTN. He presented with obstruction of his cath and abdominal pain.  He was evaluated by urology and found to have encrusted suprapubic tube.  He underwent successful exchange and good urinary flow after success.  Abdominal pain resolved after catheter exchange as well.  He was afebrile and had a presumed reactive leukocytosis.  UA noted with bacteria however considered likely colonized given chronic indwelling Foley.  He was not felt to warrant antibiotics. With further workup he was also found to be in new onset A-fib with RVR.  No arrhythmias were noted during stroke workup in December.  He was started on a Cardizem drip and cardiology was also consulted.  He converted back to NSR overnight.  He was recommended to transition to oral Cardizem and daily Xarelto for ease of dosing. He will follow-up with cardiology afib clinic outpatient.

## 2023-01-06 NOTE — Progress Notes (Signed)
AVS given to patient and explained at the bedside. Medications and follow up appointments have been explained with pt verbalizing understanding.  

## 2023-01-06 NOTE — Discharge Summary (Signed)
Physician Discharge Summary   Calvin [name] RUE:[id number] DOB: [birth year] DOA: 01/05/2023  PCP: [name], MD  Admit date: 01/05/2023 Discharge date: 01/06/2023  Admitted From: Home Disposition:  Home Discharging physician: Lewie Chamber, MD Barriers to discharge: none  Recommendations at discharge: Follow up with afib clinic  Discharge Condition: stable CODE STATUS: Full Diet recommendation:  Diet Orders (From admission, onward)     Start     Ordered   01/06/23 0000  Diet - low sodium heart healthy        01/06/23 1204   01/05/23 1504  Diet regular Room service appropriate? Yes; Fluid consistency: Thin  Diet effective now       Question Answer Comment  Room service appropriate? Yes   Fluid consistency: Thin      01/05/23 1504            Hospital Course: Calvin [name] is a 77 y.o. male with medical history significant for ischemic stroke 05/2022, BPH, neurogenic bladder now with suprapubic cath in place since 12/25/22 and HTN. He presented with obstruction of his cath and abdominal pain.  He was evaluated by urology and found to have encrusted suprapubic tube.  He underwent successful exchange and good urinary flow after success.  Abdominal pain resolved after catheter exchange as well.  He was afebrile and had a presumed reactive leukocytosis.  UA noted with bacteria however considered likely colonized given chronic indwelling Foley.  He was not felt to warrant antibiotics. With further workup he was also found to be in new onset A-fib with RVR.  No arrhythmias were noted during stroke workup in December.  He was started on a Cardizem drip and cardiology was also consulted.  He converted back to NSR overnight.  He was recommended to transition to oral Cardizem and daily Xarelto for ease of dosing. He will follow-up with cardiology afib clinic outpatient.   The patient's chronic medical conditions were treated accordingly per the patient's home medication regimen  except as noted.  On day of discharge, patient was felt deemed stable for discharge. Patient/family member advised to call PCP or come back to ER if needed.   Principal Diagnosis: Atrial fibrillation with rapid ventricular response [facility])  Discharge Diagnoses: Active Hospital Problems   Diagnosis Date Noted   Atrial fibrillation with rapid ventricular response (HCC) 01/05/2023   Atrial fibrillation with RVR (HCC) 01/06/2023    Resolved Hospital Problems  No resolved problems to display.     Discharge Instructions     Diet - low sodium heart healthy   Complete by: As directed    Increase activity slowly   Complete by: As directed       Allergies as of 01/06/2023   No Known Allergies      Medication List     STOP taking these medications    amLODipine 5 MG tablet Commonly known as: NORVASC   aspirin EC 81 MG tablet   azithromycin 250 MG tablet Commonly known as: Zithromax Z-Pak   hydrochlorothiazide 25 MG tablet Commonly known as: HYDRODIURIL   HYDROcodone bit-homatropine 5-1.5 MG/5ML syrup Commonly known as: HYCODAN   HYDROcodone-acetaminophen 5-325 MG tablet Commonly known as: NORCO/VICODIN   ketoconazole 2 % cream Commonly known as: NIZORAL   losartan 100 MG tablet Commonly known as: COZAAR       TAKE these medications    atorvastatin 20 MG tablet Commonly known as: LIPITOR Take 20 mg by mouth daily.   BD Eclipse Syringe/Needle 25G X 5/8"  3 ML Misc Generic drug: SYRINGE-NEEDLE (DISP) 3 ML Use as directed once a week   cyanocobalamin 1000 MCG/ML injection Commonly known as: VITAMIN B12 INJECT 1 ML (1,000 MCG TOTAL) INTO THE MUSCLE ONCE A WEEK.   cyclobenzaprine 10 MG tablet Commonly known as: FLEXERIL Take 1 tablet (10 mg total) by mouth 3 (three) times daily as needed for muscle spasms.   diltiazem 180 MG 24 hr capsule Commonly known as: CARDIZEM CD Take 1 capsule (180 mg total) by mouth daily. Start taking on: January 07, 2023   docusate  sodium 100 MG capsule Commonly known as: COLACE Take 100 mg by mouth 2 (two) times daily.   finasteride 5 MG tablet Commonly known as: PROSCAR TAKE 1 TABLET (5 MG TOTAL) BY MOUTH DAILY.   fluticasone 50 MCG/ACT nasal spray Commonly known as: FLONASE Place 1 spray into both nostrils daily.   folic acid 1 MG tablet Commonly known as: FOLVITE Take 1 tablet (1 mg total) by mouth daily.   furosemide 20 MG tablet Commonly known as: LASIX Take 1 tablet (20 mg total) by mouth daily.   Klor-Con M10 10 MEQ tablet Generic drug: potassium chloride Take 1 tablet (10 mEq total) by mouth 2 (two) times daily.   loperamide 2 MG capsule Commonly known as: IMODIUM Take 1 capsule (2 mg total) by mouth every 8 (eight) hours as needed for diarrhea or loose stools.   multivitamin with minerals Tabs tablet Take 1 tablet by mouth daily.   ondansetron 4 MG tablet Commonly known as: ZOFRAN Take 1 tablet (4 mg total) by mouth every 8 (eight) hours as needed for nausea or vomiting.   rivaroxaban 20 MG Tabs tablet Commonly known as: XARELTO Take 1 tablet (20 mg total) by mouth daily. Start taking on: January 07, 2023   sertraline 100 MG tablet Commonly known as: ZOLOFT TAKE 1 TABLET BY MOUTH EVERY DAY   tamsulosin 0.4 MG Caps capsule Commonly known as: FLOMAX TAKE 1 CAPSULE BY MOUTH EVERY DAY   thiamine 100 MG tablet Commonly known as: Vitamin B-1 Take 1 tablet (100 mg total) by mouth daily.        Follow-up Information     Marinus Maw, MD. Schedule an appointment as soon as possible for a visit in 2 week(s).   Specialty: Cardiology Why: For afib clinic Contact information: [address] [place] [address] [phone]                No Known Allergies  Consultations: Cardiology  Procedures:   Discharge Exam: BP (!) 152/104 (BP Location: Left Arm)   Pulse 85   Temp 98.5 F (36.9 C) (Axillary)   Resp (!) 22   Ht 6' (1.829 m)   Wt 113 kg    SpO2 92%   BMI 33.79 kg/m  Physical Exam Constitutional:      General: He is not in acute distress.    Appearance: Normal appearance.  HENT:     Head: Normocephalic and atraumatic.     Mouth/Throat:     Mouth: Mucous membranes are moist.  Eyes:     Extraocular Movements: Extraocular movements intact.  Cardiovascular:     Rate and Rhythm: Normal rate and regular rhythm.  Pulmonary:     Effort: Pulmonary effort is normal. No respiratory distress.     Breath sounds: Normal breath sounds. No wheezing.  Abdominal:     General: Bowel sounds are normal. There is no distension.  Palpations: Abdomen is soft.     Tenderness: There is no abdominal tenderness.  Musculoskeletal:        General: Normal range of motion.     Cervical back: Normal range of motion and neck supple.  Skin:    General: Skin is warm and dry.  Neurological:     General: No focal deficit present.     Mental Status: He is alert.     Comments: Mild cognitive impairment   Psychiatric:        Mood and Affect: Mood normal.        Behavior: Behavior normal.     The results of significant diagnostics from this hospitalization (including imaging, microbiology, ancillary and laboratory) are listed below for reference.   Microbiology: No results found for this or any previous visit (from the past 240 hour(s)).   Labs: BNP (last 3 results) Recent Labs    08/09/22 1558 01/05/23 1309  BNP 94.6 81.3   Basic Metabolic Panel: Recent Labs  Lab 01/05/23 1309 01/06/23 0428  NA 135 134*  K 3.4* 3.5  CL 100 101  CO2 22 24  GLUCOSE 139* 125*  BUN 15 11  CREATININE 0.85 0.81  CALCIUM 9.4 8.9  MG 1.8  --    Liver Function Tests: No results for input(s): "AST", "ALT", "ALKPHOS", "BILITOT", "PROT", "ALBUMIN" in the last 168 hours. No results for input(s): "LIPASE", "AMYLASE" in the last 168 hours. No results for input(s): "AMMONIA" in the last 168 hours. CBC: Recent Labs  Lab 01/05/23 1309 01/06/23 0428   WBC 13.4* 13.0*  NEUTROABS 9.7*  --   HGB 15.2 13.5  HCT 43.4 39.1  MCV 96.9 99.0  PLT 309 265   Cardiac Enzymes: No results for input(s): "CKTOTAL", "CKMB", "CKMBINDEX", "TROPONINI" in the last 168 hours. BNP: Invalid input(s): "POCBNP" CBG: No results for input(s): "GLUCAP" in the last 168 hours. D-Dimer No results for input(s): "DDIMER" in the last 72 hours. Hgb A1c No results for input(s): "HGBA1C" in the last 72 hours. Lipid Profile No results for input(s): "CHOL", "HDL", "LDLCALC", "TRIG", "CHOLHDL", "LDLDIRECT" in the last 72 hours. Thyroid function studies No results for input(s): "TSH", "T4TOTAL", "T3FREE", "THYROIDAB" in the last 72 hours.  Invalid input(s): "FREET3" Anemia work up No results for input(s): "VITAMINB12", "FOLATE", "FERRITIN", "TIBC", "IRON", "RETICCTPCT" in the last 72 hours. Urinalysis    Component Value Date/Time   COLORURINE YELLOW 01/05/2023 1450   APPEARANCEUR CLOUDY (A) 01/05/2023 1450   LABSPEC 1.005 01/05/2023 1450   PHURINE 7.0 01/05/2023 1450   GLUCOSEU NEGATIVE 01/05/2023 1450   HGBUR LARGE (A) 01/05/2023 1450   HGBUR negative 12/14/2009 0823   BILIRUBINUR NEGATIVE 01/05/2023 1450   BILIRUBINUR neg 09/14/2020 1459   KETONESUR NEGATIVE 01/05/2023 1450   PROTEINUR NEGATIVE 01/05/2023 1450   UROBILINOGEN 1.0 09/14/2020 1459   UROBILINOGEN 1.0 03/12/2012 1250   NITRITE NEGATIVE 01/05/2023 1450   LEUKOCYTESUR LARGE (A) 01/05/2023 1450   Sepsis Labs Recent Labs  Lab 01/05/23 1309 01/06/23 0428  WBC 13.4* 13.0*   Microbiology No results found for this or any previous visit (from the past 240 hour(s)).  Procedures/Studies: DG Chest Port 1 View  Result Date: 01/05/2023 CLINICAL DATA:  afib EXAM: PORTABLE CHEST 1 VIEW COMPARISON:  CXR 05/27/22 FINDINGS: Low lung volumes. No pleural effusion. No pneumothorax. Likely unchanged cardiac and mediastinal contours when accounting for differences in lung volume. Focal airspace opacity.  There are prominent bilateral interstitial opacities could represent pulmonary venous congestion. No radiographically apparent  displaced rib fractures. Visualized upper abdomen is unremarkable. IMPRESSION: Prominent bilateral interstitial opacities could represent pulmonary venous congestion. Electronically Signed   By: Lorenza Cambridge M.D.   On: 01/05/2023 15:19   DG C-Arm 1-60 Min-No Report  Result Date: 12/25/2022 Fluoroscopy was utilized by the requesting physician.  No radiographic interpretation.     Time coordinating discharge: Over 30 minutes    Lewie Chamber, MD  [facility] 01/06/2023, 1:33 PM

## 2023-01-06 NOTE — Consult Note (Signed)
Cardiology Consultation   Patient ID: Calvin [name] [name]: [id number]; DOB: 05-06-1946  Admit date: 01/05/2023 Date of Consult: 01/06/2023  PCP:  [name], MD   [facility] Providers Cardiologist:  Lance Muss, MD        Patient Profile:   Calvin [name] is a 77 y.o. male with a hx of stroke who is being seen 01/06/2023 for the evaluation of new atrial fib at the request of Dr. Frederick Peers.  History of Present Illness:   Calvin [name] is a pleasant but hard of hearing 77 yo man who had a stroke in 2023. He has some memory difficulty. The patient presented with foley problems and was found to be in atrial fib with a RVR. He  does not have palpitations. He notes mild increase in dyspnea with exertion. He denies syncope or chest pain. No edema.    Past Medical History:  Diagnosis Date   Anxiety    Benign prostatic hypertrophy    COVID-19 virus infection 02/24/2020   Depression    ED (erectile dysfunction)    Headache(784.0)    History of kidney stones    Hypertension    Hypogonadism male    Low back pain 06/09/2020   Nephrolithiasis    hx of had lithotripsy in 1-10.   NEPHROLITHIASIS, HX OF 10/25/2008   Qualifier: Diagnosis of   By: Clent Ridges MD, Tera Mater      Neuromuscular disorder [facility])    neuropaty legs   Pneumonia    Pyelonephritis 02/25/2022   Stroke [facility])    fall 2023 some memory issues like time of day    Past Surgical History:  Procedure Laterality Date   colonoscopy  10/05/2020   per Dr. Russella Dar, adenomatous polyps, repeat in 3 yrs   CYSTOSCOPY W/ RETROGRADES Bilateral 12/25/2022   Procedure: CYSTOSCOPY, TRANSURETHERAL RESECTION OF PROSTATE;  Surgeon: Noel Christmas, MD;  Location: [facility] ORS;  Service: Urology;  Laterality: Bilateral;  90 MINS FRO CASE   FOOT SURGERY     INSERTION OF SUPRAPUBIC CATHETER N/A 12/25/2022   Procedure: INSERTION OF SUPRAPUBIC CATHETER;  Surgeon: Noel Christmas, MD;  Location: [facility] ORS;  Service: Urology;  Laterality: N/A;    LAPAROSCOPIC APPENDECTOMY  03/12/2012   Procedure: APPENDECTOMY LAPAROSCOPIC;  Surgeon: Shelly Rubenstein, MD;  Location: [facility] OR;  Service: General;  Laterality: N/A;   STAPLE HEMORRHOIDECTOMY  04/14/2010   per Dr. Michaell Cowing    TONSILLECTOMY       Home Medications:  Prior to Admission medications   Medication Sig Start Date End Date Taking? Authorizing Provider  amLODipine (NORVASC) 5 MG tablet TAKE 1 TABLET (5 MG TOTAL) BY MOUTH DAILY. 05/03/22  Yes [name], MD  aspirin EC 81 MG tablet Take 1 tablet (81 mg total) by mouth daily. Swallow whole. 12/14/22  Yes [name], MD  atorvastatin (LIPITOR) 20 MG tablet Take 20 mg by mouth daily. 07/30/22  Yes [provider]  cyanocobalamin (VITAMIN B12) 1000 MCG/ML injection INJECT 1 ML (1,000 MCG TOTAL) INTO THE MUSCLE ONCE A WEEK. 03/13/22  Yes [name], MD  cyclobenzaprine (FLEXERIL) 10 MG tablet Take 1 tablet (10 mg total) by mouth 3 (three) times daily as needed for muscle spasms. 12/14/22  Yes [name], MD  docusate sodium (COLACE) 100 MG capsule Take 100 mg by mouth 2 (two) times daily.   Yes [provider]  finasteride (PROSCAR) 5 MG tablet TAKE 1 TABLET (5 MG TOTAL) BY  MOUTH DAILY. 10/01/22  Yes [name], MD  fluticasone (FLONASE) 50 MCG/ACT nasal spray Place 1 spray into both nostrils daily. 10/10/22  Yes Omar Person, MD  folic acid (FOLVITE) 1 MG tablet Take 1 tablet (1 mg total) by mouth daily. 12/14/22  Yes [name], MD  furosemide (LASIX) 20 MG tablet Take 1 tablet (20 mg total) by mouth daily. 12/14/22  Yes [name], MD  hydrochlorothiazide (HYDRODIURIL) 25 MG tablet Take 1 tablet (25 mg total) by mouth daily. 12/14/22  Yes [name], MD  KLOR-CON M10 10 MEQ tablet Take 1 tablet (10 mEq total) by mouth 2 (two) times daily. 12/14/22  Yes [name], MD  loperamide (IMODIUM) 2 MG capsule Take 1 capsule (2 mg total) by mouth every 8 (eight) hours as needed for diarrhea or loose  stools. 12/14/22  Yes [name], MD  losartan (COZAAR) 100 MG tablet TAKE 1 TABLET BY MOUTH EVERY DAY 05/03/22  Yes [name], MD  Multiple Vitamin (MULTIVITAMIN WITH MINERALS) TABS tablet Take 1 tablet by mouth daily. 06/01/22  Yes Sheikh, Omair Latif, DO  ondansetron (ZOFRAN) 4 MG tablet Take 1 tablet (4 mg total) by mouth every 8 (eight) hours as needed for nausea or vomiting. 12/14/22  Yes [name], MD  sertraline (ZOLOFT) 100 MG tablet TAKE 1 TABLET BY MOUTH EVERY DAY 01/04/23  Yes [name], MD  tamsulosin (FLOMAX) 0.4 MG CAPS capsule TAKE 1 CAPSULE BY MOUTH EVERY DAY 12/14/22  Yes [name], MD  thiamine (VITAMIN B-1) 100 MG tablet Take 1 tablet (100 mg total) by mouth daily. 12/14/22  Yes [name], MD  azithromycin (ZITHROMAX Z-PAK) 250 MG tablet As directed Patient not taking: Reported on 01/05/2023 12/14/22   [name], MD  HYDROcodone bit-homatropine (HYCODAN) 5-1.5 MG/5ML syrup Take 5 mLs by mouth every 4 (four) hours as needed. Patient not taking: Reported on 01/05/2023 12/14/22   [name], MD  HYDROcodone-acetaminophen (NORCO/VICODIN) 5-325 MG tablet Take 1 tablet by mouth every 4 (four) hours as needed for moderate pain. Patient not taking: Reported on 01/05/2023 12/25/22 12/25/23  Noel Christmas, MD  ketoconazole (NIZORAL) 2 % cream Apply 1 Application topically 3 (three) times daily. Patient not taking: Reported on 01/05/2023 11/27/22   [name], MD  SYRINGE-NEEDLE, DISP, 3 ML (BD ECLIPSE SYRINGE/NEEDLE) 25G X 5/8" 3 ML MISC Use as directed once a week 12/14/22   [name], MD    Inpatient Medications: Scheduled Meds:  apixaban  5 mg Oral BID   atorvastatin  20 mg Oral Daily   finasteride  5 mg Oral Daily   fluticasone  1 spray Each Nare Daily   furosemide  20 mg Oral Daily   sertraline  100 mg Oral Daily   tamsulosin  0.4 mg Oral Daily   Continuous Infusions:  diltiazem (CARDIZEM) infusion 5 mg/hr (01/06/23 8295)   PRN  Meds: acetaminophen **OR** acetaminophen, albuterol, cyclobenzaprine, metoprolol tartrate, ondansetron **OR** ondansetron (ZOFRAN) IV, traZODone  Allergies:   No Known Allergies  Social History:   Social History   Socioeconomic History   Marital status: Widowed    Spouse name: Not on file   Number of children: Not on file   Years of education: Not on file   Highest education level: Not on file  Occupational History   Occupation: retired    Comment: owned [employer]  Tobacco Use   Smoking status:  Never   Smokeless tobacco: Never  Vaping Use   Vaping status: Never Used  Substance and Sexual Activity   Alcohol use: Not Currently    Alcohol/week: 14.0 standard drinks of alcohol    Types: 14 Standard drinks or equivalent per week    Comment: daily one drink   Drug use: No   Sexual activity: Not Currently  Other Topics Concern   Not on file  Social History Narrative   Right Handed    Lives in a one story home with a basement.    Social Determinants of Health   Financial Resource Strain: Low Risk  (04/14/2020)   Overall Financial Resource Strain (CARDIA)    Difficulty of Paying Living Expenses: Not hard at all  Food Insecurity: No Food Insecurity (01/05/2023)   Hunger Vital Sign    Worried About Running Out of Food in the Last Year: Never true    Ran Out of Food in the Last Year: Never true  Transportation Needs: No Transportation Needs (01/05/2023)   PRAPARE - Administrator, Civil Service (Medical): No    Lack of Transportation (Non-Medical): No  Physical Activity: Inactive (03/08/2021)   Exercise Vital Sign    Days of Exercise per Week: 0 days    Minutes of Exercise per Session: 0 min  Stress: No Stress Concern Present (04/14/2020)   [employer] of Occupational Health - Occupational Stress Questionnaire    Feeling of Stress : Not at all  Social Connections: Socially Isolated (03/08/2021)   Social Connection and Isolation Panel [NHANES]     Frequency of Communication with Friends and Family: Once a week    Frequency of Social Gatherings with Friends and Family: Once a week    Attends Religious Services: Never    Database administrator or Organizations: No    Attends Banker Meetings: Never    Marital Status: Widowed  Intimate Partner Violence: Not At Risk (01/05/2023)   Humiliation, Afraid, Rape, and Kick questionnaire    Fear of Current or Ex-Partner: No    Emotionally Abused: No    Physically Abused: No    Sexually Abused: No    Family History:    Family History  Problem Relation Age of Onset   Heart Problems Mother        Caused by a Virus when she was a teen   Stroke Father    Prostate cancer Father    Hypertension Other    Stroke Other    Heart disease Other        rheumatic    Coronary artery disease Other    Healthy Child    Colon cancer Neg Hx    Colon polyps Neg Hx    Esophageal cancer Neg Hx    Rectal cancer Neg Hx    Stomach cancer Neg Hx      ROS:  Please see the history of present illness.   All other ROS reviewed and negative.     Physical Exam/Data:   Vitals:   01/06/23 0100 01/06/23 0120 01/06/23 0200 01/06/23 0835  BP: (!) 149/86  (!) 157/99 (!) 152/104  Pulse: 80  85 85  Resp: 20  (!) 22   Temp:  98.5 F (36.9 C)    TempSrc:  Axillary    SpO2: 94%  92%   Weight:      Height:        Intake/Output Summary (Last 24 hours) at 01/06/2023 7829 Last data filed at  01/06/2023 0900 Gross per 24 hour  Intake 333.74 ml  Output 3975 ml  Net -3641.26 ml      01/05/2023   12:42 PM 12/25/2022   10:05 AM 12/19/2022   12:58 PM  Last 3 Weights  Weight (lbs) 249 lb 1.9 oz 250 lb 250 lb  Weight (kg) 113 kg 113.399 kg 113.399 kg     Body mass index is 33.79 kg/m.  General:  Well nourished, well developed, in no acute distress HEENT: normal Neck: no JVD Vascular: No carotid bruits; Distal pulses 2+ bilaterally Cardiac:  normal S1, S2; RRR; no murmur Lungs:  clear to  auscultation bilaterally, no wheezing, rhonchi or rales  Abd: soft, nontender, no hepatomegaly  Ext: no edema Musculoskeletal:  No deformities, BUE and BLE strength normal and equal Skin: warm and dry  Neuro:  CNs 2-12 intact, no focal abnormalities noted Psych:  Normal affect   EKG:  The EKG was personally reviewed and demonstrates:  atrial fib with a RVR Telemetry:  Telemetry was personally reviewed and demonstrates:  NSR with PAC's  Relevant CV Studies: none  Laboratory Data:  High Sensitivity Troponin:  No results for input(s): "TROPONINIHS" in the last 720 hours.   Chemistry Recent Labs  Lab 01/05/23 1309 01/06/23 0428  NA 135 134*  K 3.4* 3.5  CL 100 101  CO2 22 24  GLUCOSE 139* 125*  BUN 15 11  CREATININE 0.85 0.81  CALCIUM 9.4 8.9  MG 1.8  --   GFRNONAA >60 >60  ANIONGAP 13 9    No results for input(s): "PROT", "ALBUMIN", "AST", "ALT", "ALKPHOS", "BILITOT" in the last 168 hours. Lipids No results for input(s): "CHOL", "TRIG", "HDL", "LABVLDL", "LDLCALC", "CHOLHDL" in the last 168 hours.  Hematology Recent Labs  Lab 01/05/23 1309 01/06/23 0428  WBC 13.4* 13.0*  RBC 4.48 3.95*  HGB 15.2 13.5  HCT 43.4 39.1  MCV 96.9 99.0  MCH 33.9 34.2*  MCHC 35.0 34.5  RDW 12.3 12.4  PLT 309 265   Thyroid No results for input(s): "TSH", "FREET4" in the last 168 hours.  BNP Recent Labs  Lab 01/05/23 1309  BNP 81.3    DDimer No results for input(s): "DDIMER" in the last 168 hours.   Radiology/Studies:  DG Chest Port 1 View  Result Date: 01/05/2023 CLINICAL DATA:  afib EXAM: PORTABLE CHEST 1 VIEW COMPARISON:  CXR 05/27/22 FINDINGS: Low lung volumes. No pleural effusion. No pneumothorax. Likely unchanged cardiac and mediastinal contours when accounting for differences in lung volume. Focal airspace opacity. There are prominent bilateral interstitial opacities could represent pulmonary venous congestion. No radiographically apparent displaced rib fractures. Visualized  upper abdomen is unremarkable. IMPRESSION: Prominent bilateral interstitial opacities could represent pulmonary venous congestion. Electronically Signed   By: Lorenza Cambridge M.D.   On: 01/05/2023 15:19     Assessment and Plan:   New onset atrial fib - He has reverted back to NSR. As he takes his other meds at one time, I have recommended he start xarelto 20 mg daily. Also to help with rate control, cardizem 180 mg daily. Please prescribe both as an outpatient. We will arrange followup in the atrial fib clinic. Stroke - at this point we know the etiology of his stroke.  Disp. - ok to discharge home on meds as above. We will sign off.   Risk Assessment/Risk Scores:       [facility] will sign off.   Medication Recommendations:  see above Other recommendations (labs, testing, etc):  see above Follow up as an outpatient:  in afib clinic in 2-3 weeks.   For questions or updates, please contact [facility] Please consult [email or web address] for contact info under    Signed, Lewayne Bunting, MD  01/06/2023 9:16 AM

## 2023-01-06 NOTE — Care Management Obs Status (Signed)
MEDICARE OBSERVATION STATUS NOTIFICATION   Patient Details  Name: Calvin [name] [name]: [id number] Date of Birth: Dec 05, 1945   Medicare Observation Status Notification Given:  Yes    Otelia Santee, LCSW 01/06/2023, 12:31 PM

## 2023-01-06 NOTE — Care Management CC44 (Signed)
Condition Code 44 Documentation Completed  Patient Details  Name: Calvin [name] [name]: [id number] Date of Birth: 1946/05/06   Condition Code 44 given:  Yes Patient signature on Condition Code 44 notice:  Yes Documentation of 2 MD's agreement:  Yes Code 44 added to claim:  Yes    Otelia Santee, LCSW 01/06/2023, 12:31 PM

## 2023-01-06 NOTE — Progress Notes (Signed)
   01/06/23 1232  TOC Brief Assessment  Insurance and Status Reviewed  Patient has primary care physician Yes  Home environment has been reviewed Home w/ caregiver  Prior level of function: modeified independent  Prior/Current Home Services Current home services (Private caregiver)  Social Determinants of Health Reivew SDOH reviewed no interventions necessary  Readmission risk has been reviewed Yes  Transition of care needs no transition of care needs at this time

## 2023-01-07 ENCOUNTER — Emergency Department (HOSPITAL_COMMUNITY): Payer: Medicare HMO

## 2023-01-07 ENCOUNTER — Inpatient Hospital Stay (HOSPITAL_COMMUNITY)
Admission: EM | Admit: 2023-01-07 | Discharge: 2023-01-19 | DRG: 987 | Disposition: A | Discharge status: Skilled Nursing Facility | Payer: Medicare HMO | Attending: Internal Medicine | Admitting: Internal Medicine

## 2023-01-07 ENCOUNTER — Other Ambulatory Visit: Payer: Self-pay

## 2023-01-07 ENCOUNTER — Encounter (HOSPITAL_COMMUNITY): Payer: Self-pay

## 2023-01-07 DIAGNOSIS — I110 Hypertensive heart disease with heart failure: Secondary | ICD-10-CM | POA: Diagnosis present

## 2023-01-07 DIAGNOSIS — T83510A Infection and inflammatory reaction due to cystostomy catheter, initial encounter: Principal | ICD-10-CM | POA: Diagnosis present

## 2023-01-07 DIAGNOSIS — Z823 Family history of stroke: Secondary | ICD-10-CM

## 2023-01-07 DIAGNOSIS — G9341 Metabolic encephalopathy: Secondary | ICD-10-CM | POA: Diagnosis present

## 2023-01-07 DIAGNOSIS — I5033 Acute on chronic diastolic (congestive) heart failure: Secondary | ICD-10-CM | POA: Diagnosis present

## 2023-01-07 DIAGNOSIS — K7689 Other specified diseases of liver: Secondary | ICD-10-CM | POA: Diagnosis not present

## 2023-01-07 DIAGNOSIS — F0393 Unspecified dementia, unspecified severity, with mood disturbance: Secondary | ICD-10-CM | POA: Diagnosis present

## 2023-01-07 DIAGNOSIS — E782 Mixed hyperlipidemia: Secondary | ICD-10-CM | POA: Diagnosis present

## 2023-01-07 DIAGNOSIS — Z7409 Other reduced mobility: Secondary | ICD-10-CM | POA: Diagnosis present

## 2023-01-07 DIAGNOSIS — Z6834 Body mass index (BMI) 34.0-34.9, adult: Secondary | ICD-10-CM

## 2023-01-07 DIAGNOSIS — I517 Cardiomegaly: Secondary | ICD-10-CM | POA: Diagnosis not present

## 2023-01-07 DIAGNOSIS — J9811 Atelectasis: Secondary | ICD-10-CM | POA: Diagnosis not present

## 2023-01-07 DIAGNOSIS — T83511A Infection and inflammatory reaction due to indwelling urethral catheter, initial encounter: Secondary | ICD-10-CM | POA: Diagnosis present

## 2023-01-07 DIAGNOSIS — R0989 Other specified symptoms and signs involving the circulatory and respiratory systems: Secondary | ICD-10-CM | POA: Diagnosis not present

## 2023-01-07 DIAGNOSIS — Z87440 Personal history of urinary (tract) infections: Secondary | ICD-10-CM

## 2023-01-07 DIAGNOSIS — K5900 Constipation, unspecified: Secondary | ICD-10-CM | POA: Diagnosis not present

## 2023-01-07 DIAGNOSIS — F0394 Unspecified dementia, unspecified severity, with anxiety: Secondary | ICD-10-CM | POA: Diagnosis present

## 2023-01-07 DIAGNOSIS — R7989 Other specified abnormal findings of blood chemistry: Secondary | ICD-10-CM

## 2023-01-07 DIAGNOSIS — F32A Depression, unspecified: Secondary | ICD-10-CM | POA: Diagnosis present

## 2023-01-07 DIAGNOSIS — L7622 Postprocedural hemorrhage and hematoma of skin and subcutaneous tissue following other procedure: Secondary | ICD-10-CM | POA: Diagnosis present

## 2023-01-07 DIAGNOSIS — R0902 Hypoxemia: Secondary | ICD-10-CM | POA: Diagnosis not present

## 2023-01-07 DIAGNOSIS — Z8042 Family history of malignant neoplasm of prostate: Secondary | ICD-10-CM

## 2023-01-07 DIAGNOSIS — Z8249 Family history of ischemic heart disease and other diseases of the circulatory system: Secondary | ICD-10-CM

## 2023-01-07 DIAGNOSIS — I4891 Unspecified atrial fibrillation: Secondary | ICD-10-CM | POA: Insufficient documentation

## 2023-01-07 DIAGNOSIS — I493 Ventricular premature depolarization: Secondary | ICD-10-CM | POA: Diagnosis not present

## 2023-01-07 DIAGNOSIS — E669 Obesity, unspecified: Secondary | ICD-10-CM | POA: Diagnosis present

## 2023-01-07 DIAGNOSIS — I480 Paroxysmal atrial fibrillation: Secondary | ICD-10-CM | POA: Diagnosis present

## 2023-01-07 DIAGNOSIS — N401 Enlarged prostate with lower urinary tract symptoms: Secondary | ICD-10-CM | POA: Diagnosis present

## 2023-01-07 DIAGNOSIS — A419 Sepsis, unspecified organism: Secondary | ICD-10-CM | POA: Diagnosis present

## 2023-01-07 DIAGNOSIS — Z8673 Personal history of transient ischemic attack (TIA), and cerebral infarction without residual deficits: Secondary | ICD-10-CM

## 2023-01-07 DIAGNOSIS — R001 Bradycardia, unspecified: Secondary | ICD-10-CM | POA: Diagnosis not present

## 2023-01-07 DIAGNOSIS — E871 Hypo-osmolality and hyponatremia: Secondary | ICD-10-CM

## 2023-01-07 DIAGNOSIS — I471 Supraventricular tachycardia, unspecified: Secondary | ICD-10-CM | POA: Diagnosis not present

## 2023-01-07 DIAGNOSIS — R0689 Other abnormalities of breathing: Secondary | ICD-10-CM | POA: Diagnosis not present

## 2023-01-07 DIAGNOSIS — N368 Other specified disorders of urethra: Secondary | ICD-10-CM

## 2023-01-07 DIAGNOSIS — Y846 Urinary catheterization as the cause of abnormal reaction of the patient, or of later complication, without mention of misadventure at the time of the procedure: Secondary | ICD-10-CM | POA: Diagnosis present

## 2023-01-07 DIAGNOSIS — R6510 Systemic inflammatory response syndrome (SIRS) of non-infectious origin without acute organ dysfunction: Secondary | ICD-10-CM

## 2023-01-07 DIAGNOSIS — H9110 Presbycusis, unspecified ear: Secondary | ICD-10-CM | POA: Diagnosis present

## 2023-01-07 DIAGNOSIS — Z7982 Long term (current) use of aspirin: Secondary | ICD-10-CM

## 2023-01-07 DIAGNOSIS — Z79899 Other long term (current) drug therapy: Secondary | ICD-10-CM

## 2023-01-07 DIAGNOSIS — R935 Abnormal findings on diagnostic imaging of other abdominal regions, including retroperitoneum: Secondary | ICD-10-CM | POA: Diagnosis not present

## 2023-01-07 DIAGNOSIS — I10 Essential (primary) hypertension: Secondary | ICD-10-CM | POA: Diagnosis present

## 2023-01-07 DIAGNOSIS — N319 Neuromuscular dysfunction of bladder, unspecified: Secondary | ICD-10-CM | POA: Diagnosis present

## 2023-01-07 DIAGNOSIS — G4719 Other hypersomnia: Secondary | ICD-10-CM

## 2023-01-07 DIAGNOSIS — Y838 Other surgical procedures as the cause of abnormal reaction of the patient, or of later complication, without mention of misadventure at the time of the procedure: Secondary | ICD-10-CM | POA: Diagnosis present

## 2023-01-07 DIAGNOSIS — E876 Hypokalemia: Secondary | ICD-10-CM | POA: Diagnosis present

## 2023-01-07 DIAGNOSIS — N390 Urinary tract infection, site not specified: Secondary | ICD-10-CM | POA: Diagnosis not present

## 2023-01-07 DIAGNOSIS — Z87442 Personal history of urinary calculi: Secondary | ICD-10-CM

## 2023-01-07 DIAGNOSIS — Z9049 Acquired absence of other specified parts of digestive tract: Secondary | ICD-10-CM

## 2023-01-07 DIAGNOSIS — D3501 Benign neoplasm of right adrenal gland: Secondary | ICD-10-CM | POA: Diagnosis not present

## 2023-01-07 DIAGNOSIS — Z8616 Personal history of COVID-19: Secondary | ICD-10-CM

## 2023-01-07 DIAGNOSIS — Z7901 Long term (current) use of anticoagulants: Secondary | ICD-10-CM

## 2023-01-07 DIAGNOSIS — R509 Fever, unspecified: Secondary | ICD-10-CM | POA: Diagnosis not present

## 2023-01-07 DIAGNOSIS — R338 Other retention of urine: Secondary | ICD-10-CM | POA: Diagnosis present

## 2023-01-07 DIAGNOSIS — Y733 Surgical instruments, materials and gastroenterology and urology devices (including sutures) associated with adverse incidents: Secondary | ICD-10-CM | POA: Diagnosis present

## 2023-01-07 DIAGNOSIS — N400 Enlarged prostate without lower urinary tract symptoms: Secondary | ICD-10-CM

## 2023-01-07 LAB — URINALYSIS, ROUTINE W REFLEX MICROSCOPIC
Bilirubin Urine: NEGATIVE
Glucose, UA: NEGATIVE mg/dL
Ketones, ur: NEGATIVE mg/dL
Nitrite: POSITIVE — AB
Protein, ur: 30 mg/dL — AB
Specific Gravity, Urine: 1.014 (ref 1.005–1.030)
pH: 7 (ref 5.0–8.0)

## 2023-01-07 LAB — CBC WITH DIFFERENTIAL/PLATELET
Abs Immature Granulocytes: 0.11 10*3/uL — ABNORMAL HIGH (ref 0.00–0.07)
Basophils Absolute: 0 10*3/uL (ref 0.0–0.1)
Basophils Relative: 0 %
Eosinophils Absolute: 0 10*3/uL (ref 0.0–0.5)
Eosinophils Relative: 0 %
HCT: 38.9 % — ABNORMAL LOW (ref 39.0–52.0)
Hemoglobin: 14 g/dL (ref 13.0–17.0)
Immature Granulocytes: 1 %
Lymphocytes Relative: 5 %
Lymphs Abs: 0.7 10*3/uL (ref 0.7–4.0)
MCH: 34.8 pg — ABNORMAL HIGH (ref 26.0–34.0)
MCHC: 36 g/dL (ref 30.0–36.0)
MCV: 96.8 fL (ref 80.0–100.0)
Monocytes Absolute: 1.5 10*3/uL — ABNORMAL HIGH (ref 0.1–1.0)
Monocytes Relative: 12 %
Neutro Abs: 10.7 10*3/uL — ABNORMAL HIGH (ref 1.7–7.7)
Neutrophils Relative %: 82 %
Platelets: 250 10*3/uL (ref 150–400)
RBC: 4.02 MIL/uL — ABNORMAL LOW (ref 4.22–5.81)
RDW: 12 % (ref 11.5–15.5)
WBC: 13.1 10*3/uL — ABNORMAL HIGH (ref 4.0–10.5)
nRBC: 0 % (ref 0.0–0.2)

## 2023-01-07 LAB — COMPREHENSIVE METABOLIC PANEL
ALT: 19 U/L (ref 0–44)
AST: 13 U/L — ABNORMAL LOW (ref 15–41)
Albumin: 3.5 g/dL (ref 3.5–5.0)
Alkaline Phosphatase: 82 U/L (ref 38–126)
Anion gap: 11 (ref 5–15)
BUN: 13 mg/dL (ref 8–23)
CO2: 21 mmol/L — ABNORMAL LOW (ref 22–32)
Calcium: 8.4 mg/dL — ABNORMAL LOW (ref 8.9–10.3)
Chloride: 98 mmol/L (ref 98–111)
Creatinine, Ser: 0.75 mg/dL (ref 0.61–1.24)
GFR, Estimated: >60 mL/min (ref >60)
Glucose, Bld: 144 mg/dL — ABNORMAL HIGH (ref 70–99)
Potassium: 3.4 mmol/L — ABNORMAL LOW (ref 3.5–5.1)
Sodium: 130 mmol/L — ABNORMAL LOW (ref 135–145)
Total Bilirubin: 1.8 mg/dL — ABNORMAL HIGH (ref 0.3–1.2)
Total Protein: 7.3 g/dL (ref 6.5–8.1)

## 2023-01-07 LAB — LACTIC ACID, PLASMA
Lactic Acid, Venous: 0.7 mmol/L (ref 0.5–1.9)
Lactic Acid, Venous: 0.7 mmol/L (ref 0.5–1.9)

## 2023-01-07 MED ORDER — IOHEXOL 350 MG/ML SOLN
80.0000 mL | Freq: Once | INTRAVENOUS | Status: AC | PRN
  Administered 2023-01-07: 80 mL via INTRAVENOUS

## 2023-01-07 MED ORDER — SODIUM CHLORIDE 0.9 % IV SOLN
1.0000 g | Freq: Once | INTRAVENOUS | Status: AC
  Administered 2023-01-07: 1 g via INTRAVENOUS
  Filled 2023-01-07: qty 10

## 2023-01-07 NOTE — ED Provider Notes (Addendum)
[place] [facility] DEPARTMENT AT [facility] Provider Note   CSN: [id number] Arrival date & time: 01/07/23  1807     History  Chief Complaint  Patient presents with   Weakness   Fever    Calvin [name] is a 77 y.o. male.   Weakness Associated symptoms: fever   Fever Patient presents with weakness and fever.  Has had a cough.  Recent admission in the hospital for Foley catheter change and new onset A-fib.  Had Foley catheter placed initially on 9 July.  Now had fever up to 100.9 at home.  Also had hypoxia for EMS.  Not on oxygen at baseline.    Past Medical History:  Diagnosis Date   Anxiety    Benign prostatic hypertrophy    COVID-19 virus infection 02/24/2020   Depression    ED (erectile dysfunction)    Headache(784.0)    History of kidney stones    Hypertension    Hypogonadism male    Low back pain 06/09/2020   Nephrolithiasis    hx of had lithotripsy in 1-10.   NEPHROLITHIASIS, HX OF 10/25/2008   Qualifier: Diagnosis of   By: Clent Ridges MD, Tera Mater      Neuromuscular disorder [facility])    neuropaty legs   Pneumonia    Pyelonephritis 02/25/2022   Stroke [facility])    fall 2023 some memory issues like time of day    Home Medications Prior to Admission medications   Medication Sig Start Date End Date Taking? Authorizing Provider  atorvastatin (LIPITOR) 20 MG tablet Take 20 mg by mouth daily. 07/30/22   [provider]  cyanocobalamin (VITAMIN B12) 1000 MCG/ML injection INJECT 1 ML (1,000 MCG TOTAL) INTO THE MUSCLE ONCE A WEEK. 03/13/22   [name], MD  cyclobenzaprine (FLEXERIL) 10 MG tablet Take 1 tablet (10 mg total) by mouth 3 (three) times daily as needed for muscle spasms. 12/14/22   [name], MD  diltiazem (CARDIZEM CD) 180 MG 24 hr capsule Take 1 capsule (180 mg total) by mouth daily. 01/07/23   Lewie Chamber, MD  docusate sodium (COLACE) 100 MG capsule Take 100 mg by mouth 2 (two) times daily.    [provider]  finasteride  (PROSCAR) 5 MG tablet TAKE 1 TABLET (5 MG TOTAL) BY MOUTH DAILY. 10/01/22   [name], MD  fluticasone (FLONASE) 50 MCG/ACT nasal spray Place 1 spray into both nostrils daily. 10/10/22   Omar Person, MD  folic acid (FOLVITE) 1 MG tablet Take 1 tablet (1 mg total) by mouth daily. 12/14/22   [name], MD  furosemide (LASIX) 20 MG tablet Take 1 tablet (20 mg total) by mouth daily. 12/14/22   [name], MD  KLOR-CON M10 10 MEQ tablet Take 1 tablet (10 mEq total) by mouth 2 (two) times daily. 12/14/22   [name], MD  loperamide (IMODIUM) 2 MG capsule Take 1 capsule (2 mg total) by mouth every 8 (eight) hours as needed for diarrhea or loose stools. 12/14/22   [name], MD  Multiple Vitamin (MULTIVITAMIN WITH MINERALS) TABS tablet Take 1 tablet by mouth daily. 06/01/22   Marguerita Merles Latif, DO  ondansetron (ZOFRAN) 4 MG tablet Take 1 tablet (4 mg total) by mouth every 8 (eight) hours as needed for nausea or vomiting. 12/14/22   [name], MD  rivaroxaban (XARELTO) 20 MG TABS tablet Take 1 tablet (20 mg total) by mouth daily. 01/07/23   Girguis,  Onalee Hua, MD  sertraline (ZOLOFT) 100 MG tablet TAKE 1 TABLET BY MOUTH EVERY DAY 01/04/23   [name], MD  SYRINGE-NEEDLE, DISP, 3 ML (BD ECLIPSE SYRINGE/NEEDLE) 25G X 5/8" 3 ML MISC Use as directed once a week 12/14/22   [name], MD  tamsulosin (FLOMAX) 0.4 MG CAPS capsule TAKE 1 CAPSULE BY MOUTH EVERY DAY 12/14/22   [name], MD  thiamine (VITAMIN B-1) 100 MG tablet Take 1 tablet (100 mg total) by mouth daily. 12/14/22   [name], MD      Allergies    Patient has no known allergies.    Review of Systems   Review of Systems  Constitutional:  Positive for fever.  Neurological:  Positive for weakness.    Physical Exam Updated Vital Signs BP (!) 157/95   Pulse (!) 111   Temp 98.4 F (36.9 C) (Oral)   Resp (!) 28   Ht 6' (1.829 m)   Wt 114.3 kg   SpO2 96%   BMI 34.18 kg/m  Physical Exam Vitals and  nursing note reviewed.  Cardiovascular:     Rate and Rhythm: Tachycardia present.  Abdominal:     Comments: Suprapubic catheter in place.  Mild erythema.  Neurological:     Mental Status: He is alert.     ED Results / Procedures / Treatments   Labs (all labs ordered are listed, but only abnormal results are displayed) Labs Reviewed  COMPREHENSIVE METABOLIC PANEL - Abnormal; Notable for the following components:      Result Value   Sodium 130 (*)    Potassium 3.4 (*)    CO2 21 (*)    Glucose, Bld 144 (*)    Calcium 8.4 (*)    AST 13 (*)    Total Bilirubin 1.8 (*)    All other components within normal limits  CBC WITH DIFFERENTIAL/PLATELET - Abnormal; Notable for the following components:   WBC 13.1 (*)    RBC 4.02 (*)    HCT 38.9 (*)    MCH 34.8 (*)    Neutro Abs 10.7 (*)    Monocytes Absolute 1.5 (*)    Abs Immature Granulocytes 0.11 (*)    All other components within normal limits  URINALYSIS, ROUTINE W REFLEX MICROSCOPIC - Abnormal; Notable for the following components:   APPearance HAZY (*)    Hgb urine dipstick MODERATE (*)    Protein, ur 30 (*)    Nitrite POSITIVE (*)    Leukocytes,Ua MODERATE (*)    Bacteria, UA MANY (*)    All other components within normal limits  CULTURE, BLOOD (ROUTINE X 2)  CULTURE, BLOOD (ROUTINE X 2)  URINE CULTURE  LACTIC ACID, PLASMA  LACTIC ACID, PLASMA  BRAIN NATRIURETIC PEPTIDE    EKG None  Radiology DG Chest Portable 1 View  Result Date: 01/07/2023 CLINICAL DATA:  Fever EXAM: PORTABLE CHEST 1 VIEW COMPARISON:  01/05/2023 FINDINGS: Cardiomegaly. Mediastinal contours within normal limits. Vascular congestion. Low lung volumes with left base atelectasis. No overt edema or effusions. No acute bony abnormality. IMPRESSION: Low lung volumes with left base atelectasis. Cardiomegaly, vascular congestion. Electronically Signed   By: Charlett Nose M.D.   On: 01/07/2023 19:30    Procedures Procedures    Medications Ordered in  ED Medications  cefTRIAXone (ROCEPHIN) 1 g in sodium chloride 0.9 % 100 mL IVPB (0 g Intravenous Stopped 01/07/23 2015)  iohexol (OMNIPAQUE) 350 MG/ML injection 80 mL (80 mLs Intravenous Contrast Given 01/07/23 2332)  ED Course/ Medical Decision Making/ A&P                             Medical Decision Making Amount and/or Complexity of Data Reviewed Labs: ordered. Radiology: ordered.  Risk Prescription drug management. Decision regarding hospitalization.   Patient with fever.  Occasional cough and mild hypoxia but also has cloudy urine and new Foley catheter.  Discussed with Dr. Liliane Shi from urology.  Would empirically treat for urinary source.  Do not think we need CT imaging and he can around tomorrow.  Will check basic blood work.  Will give Rocephin and culture the urine.  Also mild hypoxia.  X-ray reassuring.  On anticoagulation now for atrial fibrillation.  Lab work similar to prior.  Does not appear overtly septic.  Not febrile here.  However reportedly did have temperature up to 100.9 at home.  More fatigue.  Also some shortness of breath.  Has had some shortness of breath in the past.  Recent A-fib.  CT scan will be done to evaluate for PE.  Independently interpreted and reassuring by me.  Will discuss with hospitalist for admission.  Patient also has hyponatremia.  Has had similar symptoms in the past.          Final Clinical Impression(s) / ED Diagnoses Final diagnoses:  Lower urinary tract infectious disease  Hyponatremia    Rx / DC Orders ED Discharge Orders     None         Benjiman Core, MD 01/07/23 1610    Benjiman Core, MD 01/07/23 2352

## 2023-01-07 NOTE — ED Triage Notes (Signed)
Pt BIBA with c/o fever 100.9, weakness, suprapubic catheter has an malodor and redness. Here for same 2 days ago. Takes Eliquis.   BP 148/86 HR 50-100, dx with afib 89% RA, 93% 2L  CBG 155 500cc NS 20G LFA

## 2023-01-08 ENCOUNTER — Telehealth: Payer: Self-pay | Admitting: Pulmonary Disease

## 2023-01-08 ENCOUNTER — Observation Stay (HOSPITAL_BASED_OUTPATIENT_CLINIC_OR_DEPARTMENT_OTHER): Payer: Medicare HMO

## 2023-01-08 DIAGNOSIS — N319 Neuromuscular dysfunction of bladder, unspecified: Secondary | ICD-10-CM | POA: Diagnosis present

## 2023-01-08 DIAGNOSIS — R7989 Other specified abnormal findings of blood chemistry: Secondary | ICD-10-CM

## 2023-01-08 DIAGNOSIS — E669 Obesity, unspecified: Secondary | ICD-10-CM | POA: Diagnosis not present

## 2023-01-08 DIAGNOSIS — I517 Cardiomegaly: Secondary | ICD-10-CM | POA: Diagnosis not present

## 2023-01-08 DIAGNOSIS — L7622 Postprocedural hemorrhage and hematoma of skin and subcutaneous tissue following other procedure: Secondary | ICD-10-CM | POA: Diagnosis not present

## 2023-01-08 DIAGNOSIS — R319 Hematuria, unspecified: Secondary | ICD-10-CM | POA: Diagnosis not present

## 2023-01-08 DIAGNOSIS — K4020 Bilateral inguinal hernia, without obstruction or gangrene, not specified as recurrent: Secondary | ICD-10-CM | POA: Diagnosis not present

## 2023-01-08 DIAGNOSIS — Y733 Surgical instruments, materials and gastroenterology and urology devices (including sutures) associated with adverse incidents: Secondary | ICD-10-CM | POA: Diagnosis not present

## 2023-01-08 DIAGNOSIS — I5033 Acute on chronic diastolic (congestive) heart failure: Secondary | ICD-10-CM | POA: Diagnosis not present

## 2023-01-08 DIAGNOSIS — I480 Paroxysmal atrial fibrillation: Secondary | ICD-10-CM | POA: Diagnosis not present

## 2023-01-08 DIAGNOSIS — Z6834 Body mass index (BMI) 34.0-34.9, adult: Secondary | ICD-10-CM | POA: Diagnosis not present

## 2023-01-08 DIAGNOSIS — Z8249 Family history of ischemic heart disease and other diseases of the circulatory system: Secondary | ICD-10-CM | POA: Diagnosis not present

## 2023-01-08 DIAGNOSIS — A419 Sepsis, unspecified organism: Secondary | ICD-10-CM | POA: Diagnosis not present

## 2023-01-08 DIAGNOSIS — R509 Fever, unspecified: Secondary | ICD-10-CM

## 2023-01-08 DIAGNOSIS — N400 Enlarged prostate without lower urinary tract symptoms: Secondary | ICD-10-CM | POA: Diagnosis not present

## 2023-01-08 DIAGNOSIS — Z8616 Personal history of COVID-19: Secondary | ICD-10-CM | POA: Diagnosis not present

## 2023-01-08 DIAGNOSIS — E871 Hypo-osmolality and hyponatremia: Secondary | ICD-10-CM | POA: Diagnosis not present

## 2023-01-08 DIAGNOSIS — F0393 Unspecified dementia, unspecified severity, with mood disturbance: Secondary | ICD-10-CM | POA: Diagnosis not present

## 2023-01-08 DIAGNOSIS — T83098A Other mechanical complication of other indwelling urethral catheter, initial encounter: Secondary | ICD-10-CM | POA: Diagnosis not present

## 2023-01-08 DIAGNOSIS — Y838 Other surgical procedures as the cause of abnormal reaction of the patient, or of later complication, without mention of misadventure at the time of the procedure: Secondary | ICD-10-CM | POA: Diagnosis not present

## 2023-01-08 DIAGNOSIS — I471 Supraventricular tachycardia, unspecified: Secondary | ICD-10-CM | POA: Diagnosis not present

## 2023-01-08 DIAGNOSIS — N401 Enlarged prostate with lower urinary tract symptoms: Secondary | ICD-10-CM | POA: Diagnosis not present

## 2023-01-08 DIAGNOSIS — R0902 Hypoxemia: Secondary | ICD-10-CM

## 2023-01-08 DIAGNOSIS — F0394 Unspecified dementia, unspecified severity, with anxiety: Secondary | ICD-10-CM | POA: Diagnosis not present

## 2023-01-08 DIAGNOSIS — E876 Hypokalemia: Secondary | ICD-10-CM | POA: Diagnosis not present

## 2023-01-08 DIAGNOSIS — T83511A Infection and inflammatory reaction due to indwelling urethral catheter, initial encounter: Secondary | ICD-10-CM | POA: Diagnosis present

## 2023-01-08 DIAGNOSIS — I110 Hypertensive heart disease with heart failure: Secondary | ICD-10-CM | POA: Diagnosis not present

## 2023-01-08 DIAGNOSIS — R0600 Dyspnea, unspecified: Secondary | ICD-10-CM | POA: Diagnosis not present

## 2023-01-08 DIAGNOSIS — G9341 Metabolic encephalopathy: Secondary | ICD-10-CM | POA: Diagnosis not present

## 2023-01-08 DIAGNOSIS — F32A Depression, unspecified: Secondary | ICD-10-CM | POA: Diagnosis not present

## 2023-01-08 DIAGNOSIS — N390 Urinary tract infection, site not specified: Secondary | ICD-10-CM | POA: Diagnosis not present

## 2023-01-08 DIAGNOSIS — R338 Other retention of urine: Secondary | ICD-10-CM | POA: Diagnosis not present

## 2023-01-08 DIAGNOSIS — Z79899 Other long term (current) drug therapy: Secondary | ICD-10-CM | POA: Diagnosis not present

## 2023-01-08 DIAGNOSIS — R918 Other nonspecific abnormal finding of lung field: Secondary | ICD-10-CM | POA: Diagnosis not present

## 2023-01-08 DIAGNOSIS — R6510 Systemic inflammatory response syndrome (SIRS) of non-infectious origin without acute organ dysfunction: Secondary | ICD-10-CM

## 2023-01-08 DIAGNOSIS — Y846 Urinary catheterization as the cause of abnormal reaction of the patient, or of later complication, without mention of misadventure at the time of the procedure: Secondary | ICD-10-CM | POA: Diagnosis not present

## 2023-01-08 DIAGNOSIS — N281 Cyst of kidney, acquired: Secondary | ICD-10-CM | POA: Diagnosis not present

## 2023-01-08 DIAGNOSIS — E782 Mixed hyperlipidemia: Secondary | ICD-10-CM | POA: Diagnosis not present

## 2023-01-08 DIAGNOSIS — T83510A Infection and inflammatory reaction due to cystostomy catheter, initial encounter: Secondary | ICD-10-CM | POA: Diagnosis not present

## 2023-01-08 DIAGNOSIS — I4891 Unspecified atrial fibrillation: Secondary | ICD-10-CM | POA: Diagnosis not present

## 2023-01-08 DIAGNOSIS — Z7901 Long term (current) use of anticoagulants: Secondary | ICD-10-CM | POA: Diagnosis not present

## 2023-01-08 LAB — MAGNESIUM: Magnesium: 2 mg/dL (ref 1.7–2.4)

## 2023-01-08 LAB — ECHOCARDIOGRAM COMPLETE
AR max vel: 2.22 cm2
AV Area VTI: 2.3 cm2
AV Area mean vel: 2.2 cm2
AV Mean grad: 5 mmHg
AV Peak grad: 8.8 mmHg
Ao pk vel: 1.48 m/s
Height: 72 in
S' Lateral: 3.1 cm
Weight: 4032 oz

## 2023-01-08 LAB — CULTURE, BLOOD (ROUTINE X 2)
Culture: NO GROWTH
Special Requests: ADEQUATE

## 2023-01-08 LAB — CBC
HCT: 39.3 % (ref 39.0–52.0)
Hemoglobin: 13.7 g/dL (ref 13.0–17.0)
MCH: 34.2 pg — ABNORMAL HIGH (ref 26.0–34.0)
MCHC: 34.9 g/dL (ref 30.0–36.0)
MCV: 98 fL (ref 80.0–100.0)
Platelets: 248 10*3/uL (ref 150–400)
RBC: 4.01 MIL/uL — ABNORMAL LOW (ref 4.22–5.81)
RDW: 12.1 % (ref 11.5–15.5)
WBC: 12 10*3/uL — ABNORMAL HIGH (ref 4.0–10.5)
nRBC: 0 % (ref 0.0–0.2)

## 2023-01-08 LAB — BRAIN NATRIURETIC PEPTIDE: B Natriuretic Peptide: 226.8 pg/mL — ABNORMAL HIGH (ref 0.0–100.0)

## 2023-01-08 MED ORDER — METOPROLOL TARTRATE 5 MG/5ML IV SOLN: 5.0000 mg | INTRAVENOUS | Status: DC | PRN

## 2023-01-08 MED ORDER — LOPERAMIDE HCL 2 MG PO CAPS: 2.0000 mg | ORAL_CAPSULE | Freq: Three times a day (TID) | ORAL | Status: DC | PRN

## 2023-01-08 MED ORDER — PERFLUTREN LIPID MICROSPHERE
1.0000 mL | INTRAVENOUS | Status: AC | PRN
  Administered 2023-01-08: 3 mL via INTRAVENOUS

## 2023-01-08 MED ORDER — ONDANSETRON HCL 4 MG/2ML IJ SOLN: 4.0000 mg | Freq: Four times a day (QID) | INTRAMUSCULAR | Status: DC | PRN

## 2023-01-08 MED ORDER — DILTIAZEM HCL ER COATED BEADS 180 MG PO CP24
180.0000 mg | ORAL_CAPSULE | Freq: Every day | ORAL | Status: DC
  Administered 2023-01-08 – 2023-01-19 (×12): 180 mg via ORAL
  Filled 2023-01-08 (×12): qty 1

## 2023-01-08 MED ORDER — ACETAMINOPHEN 650 MG RE SUPP
650.0000 mg | Freq: Four times a day (QID) | RECTAL | Status: DC | PRN
  Filled 2023-01-08 (×2): qty 1

## 2023-01-08 MED ORDER — RIVAROXABAN 20 MG PO TABS
20.0000 mg | ORAL_TABLET | Freq: Every day | ORAL | Status: DC
  Administered 2023-01-08 – 2023-01-09 (×2): 20 mg via ORAL
  Filled 2023-01-08 (×2): qty 1

## 2023-01-08 MED ORDER — SODIUM CHLORIDE 0.9 % IV SOLN: INTRAVENOUS | Status: AC

## 2023-01-08 MED ORDER — SODIUM CHLORIDE 0.9 % IV SOLN
1.0000 g | INTRAVENOUS | Status: AC
  Administered 2023-01-08 – 2023-01-16 (×9): 1 g via INTRAVENOUS
  Filled 2023-01-08 (×9): qty 10

## 2023-01-08 MED ORDER — POTASSIUM CHLORIDE CRYS ER 20 MEQ PO TBCR
40.0000 meq | EXTENDED_RELEASE_TABLET | Freq: Once | ORAL | Status: AC
  Administered 2023-01-08: 40 meq via ORAL
  Filled 2023-01-08: qty 2

## 2023-01-08 MED ORDER — LOPERAMIDE HCL 2 MG PO CAPS
2.0000 mg | ORAL_CAPSULE | Freq: Two times a day (BID) | ORAL | Status: DC
  Administered 2023-01-08 – 2023-01-11 (×4): 2 mg via ORAL
  Filled 2023-01-08 (×5): qty 1

## 2023-01-08 MED ORDER — ACETAMINOPHEN 325 MG PO TABS
650.0000 mg | ORAL_TABLET | Freq: Four times a day (QID) | ORAL | Status: DC | PRN
  Administered 2023-01-11 – 2023-01-15 (×2): 650 mg via ORAL
  Filled 2023-01-08: qty 2

## 2023-01-08 NOTE — ED Provider Notes (Signed)
Case discussed with Dr. Loney Loh of [facility], who agrees to admit the patient.   Dione Booze, MD 01/08/23 [phone]

## 2023-01-08 NOTE — H&P (Signed)
History and Physical    Calvin [name]:[id number] DOB: Mar 04, 1946 DOA: 01/07/2023  PCP: [name], MD  Patient coming from: Home  Chief Complaint: Fever, weakness  HPI: Calvin [name] is a 77 y.o. male with medical history significant of ischemic stroke in December 2023, hypertension, hyperlipidemia, BPH, neurogenic bladder now with suprapubic catheter in place since 04/27/2023, anxiety, depression.  Recently admitted 7/20-7/21 due to obstruction of his suprapubic catheter for which he was evaluated by urology and found to have encrusted suprapubic tube which was exchanged.  He was also found to have new onset A-fib with RVR and was discharged on oral Cardizem and Xarelto.  Patient presents to the ED via EMS with complaints of fever, malodorous urine, cough, shortness of breath, and generalized weakness.  He was satting 89% on room air and placed on 2 L [place].  Received 500 mL normal saline by EMS.  In the ED, patient was afebrile but slightly tachycardic and tachypneic.  Not hypotensive.  Labs showing WBC 13.1, sodium 130, potassium 3.4, bicarb 21, glucose 144, creatinine 0.7, T. bili 1.8, no elevation of transaminases or alkaline phosphatase, BNP 226, lactate normal x 2, blood cultures drawn.  UA with positive nitrite, moderate leukocytes, and microscopy showing 21-50 RBCs, 21-50 WBCs, and many bacteria.  Urine culture pending.  CTA chest negative for PE and showing mild bibasilar atelectatic changes.  ED physician consulted urology (Dr. Liliane Shi) who recommended starting antibiotics to treat for UTI and will consult in the morning.  Patient was given ceftriaxone.  History provided by the patient and his family at bedside.  Daughter states patient stays at home alone but has a full-time caretaker.  States he was doing okay when he was discharged from the hospital 2 days ago but since yesterday he is having fevers, generalized weakness, and confusion.  He has felt nauseous and having dry heaves.   His caretaker has been concerned about foul smell coming from his suprapubic catheter site.  He has a chronic cough but family recently noticed that he is having dyspnea with exertion.  Patient denies chest pain or abdominal pain.  He has no other complaints.  Review of Systems:  Review of Systems  All other systems reviewed and are negative.   Past Medical History:  Diagnosis Date   Anxiety    Benign prostatic hypertrophy    COVID-19 virus infection 02/24/2020   Depression    ED (erectile dysfunction)    Headache(784.0)    History of kidney stones    Hypertension    Hypogonadism male    Low back pain 06/09/2020   Nephrolithiasis    hx of had lithotripsy in 1-10.   NEPHROLITHIASIS, HX OF 10/25/2008   Qualifier: Diagnosis of   By: Clent Ridges MD, Tera Mater      Neuromuscular disorder [facility])    neuropaty legs   Pneumonia    Pyelonephritis 02/25/2022   Stroke [facility])    fall 2023 some memory issues like time of day    Past Surgical History:  Procedure Laterality Date   colonoscopy  10/05/2020   per Dr. Russella Dar, adenomatous polyps, repeat in 3 yrs   CYSTOSCOPY W/ RETROGRADES Bilateral 12/25/2022   Procedure: CYSTOSCOPY, TRANSURETHERAL RESECTION OF PROSTATE;  Surgeon: Noel Christmas, MD;  Location: [facility] ORS;  Service: Urology;  Laterality: Bilateral;  90 MINS FRO CASE   FOOT SURGERY     INSERTION OF SUPRAPUBIC CATHETER N/A 12/25/2022   Procedure: INSERTION OF SUPRAPUBIC CATHETER;  Surgeon: Arita Miss,  Weyman Croon, MD;  Location: [facility] ORS;  Service: Urology;  Laterality: N/A;   LAPAROSCOPIC APPENDECTOMY  03/12/2012   Procedure: APPENDECTOMY LAPAROSCOPIC;  Surgeon: Shelly Rubenstein, MD;  Location: [facility] OR;  Service: General;  Laterality: N/A;   STAPLE HEMORRHOIDECTOMY  04/14/2010   per Dr. Michaell Cowing    TONSILLECTOMY       reports that he has never smoked. He has never used smokeless tobacco. He reports that he does not currently use alcohol after a past usage of about 14.0 standard drinks of alcohol per  week. He reports that he does not use drugs.  No Known Allergies  Family History  Problem Relation Age of Onset   Heart Problems Mother        Caused by a Virus when she was a teen   Stroke Father    Prostate cancer Father    Hypertension Other    Stroke Other    Heart disease Other        rheumatic    Coronary artery disease Other    Healthy Child    Colon cancer Neg Hx    Colon polyps Neg Hx    Esophageal cancer Neg Hx    Rectal cancer Neg Hx    Stomach cancer Neg Hx     Prior to Admission medications   Medication Sig Start Date End Date Taking? Authorizing Provider  atorvastatin (LIPITOR) 20 MG tablet Take 20 mg by mouth daily. 07/30/22   [provider]  cyanocobalamin (VITAMIN B12) 1000 MCG/ML injection INJECT 1 ML (1,000 MCG TOTAL) INTO THE MUSCLE ONCE A WEEK. 03/13/22   [name], MD  cyclobenzaprine (FLEXERIL) 10 MG tablet Take 1 tablet (10 mg total) by mouth 3 (three) times daily as needed for muscle spasms. 12/14/22   [name], MD  diltiazem (CARDIZEM CD) 180 MG 24 hr capsule Take 1 capsule (180 mg total) by mouth daily. 01/07/23   Lewie Chamber, MD  docusate sodium (COLACE) 100 MG capsule Take 100 mg by mouth 2 (two) times daily.    [provider]  finasteride (PROSCAR) 5 MG tablet TAKE 1 TABLET (5 MG TOTAL) BY MOUTH DAILY. 10/01/22   [name], MD  fluticasone (FLONASE) 50 MCG/ACT nasal spray Place 1 spray into both nostrils daily. 10/10/22   Omar Person, MD  folic acid (FOLVITE) 1 MG tablet Take 1 tablet (1 mg total) by mouth daily. 12/14/22   [name], MD  furosemide (LASIX) 20 MG tablet Take 1 tablet (20 mg total) by mouth daily. 12/14/22   [name], MD  KLOR-CON M10 10 MEQ tablet Take 1 tablet (10 mEq total) by mouth 2 (two) times daily. 12/14/22   [name], MD  loperamide (IMODIUM) 2 MG capsule Take 1 capsule (2 mg total) by mouth every 8 (eight) hours as needed for diarrhea or loose stools. 12/14/22   [name], MD  Multiple Vitamin (MULTIVITAMIN WITH MINERALS) TABS tablet Take 1 tablet by mouth daily. 06/01/22   Marguerita Merles Latif, DO  ondansetron (ZOFRAN) 4 MG tablet Take 1 tablet (4 mg total) by mouth every 8 (eight) hours as needed for nausea or vomiting. 12/14/22   [name], MD  rivaroxaban (XARELTO) 20 MG TABS tablet Take 1 tablet (20 mg total) by mouth daily. 01/07/23   Lewie Chamber, MD  sertraline (ZOLOFT) 100 MG tablet TAKE 1 TABLET BY MOUTH EVERY DAY 01/04/23   [name], MD  SYRINGE-NEEDLE,  DISP, 3 ML (BD ECLIPSE SYRINGE/NEEDLE) 25G X 5/8" 3 ML MISC Use as directed once a week 12/14/22   [name], MD  tamsulosin (FLOMAX) 0.4 MG CAPS capsule TAKE 1 CAPSULE BY MOUTH EVERY DAY 12/14/22   [name], MD  thiamine (VITAMIN B-1) 100 MG tablet Take 1 tablet (100 mg total) by mouth daily. 12/14/22   [name], MD    Physical Exam: Vitals:   01/08/23 0255 01/08/23 0300 01/08/23 0343 01/08/23 0400  BP:  (!) 165/102 (!) 164/101 (!) 144/89  Pulse:  97  (!) 108  Resp:  (!) 26 (!) 24 (!) 23  Temp: 98.8 F (37.1 C)  98.1 F (36.7 C)   TempSrc: Oral  Oral   SpO2:  94% 93% 95%  Weight:      Height:        Physical Exam Vitals reviewed.  Constitutional:      General: He is not in acute distress.    Appearance: He is ill-appearing.  HENT:     Head: Normocephalic and atraumatic.  Eyes:     Extraocular Movements: Extraocular movements intact.  Cardiovascular:     Rate and Rhythm: Regular rhythm. Tachycardia present.     Pulses: Normal pulses.     Comments: Slightly tachycardic Pulmonary:     Effort: No respiratory distress.     Breath sounds: No wheezing or rales.     Comments: Slightly tachypneic Abdominal:     General: Bowel sounds are normal. There is no distension.     Palpations: Abdomen is soft.     Tenderness: There is no abdominal tenderness.     Comments: Erythema and purulence noted around suprapubic catheter site.  Musculoskeletal:     Cervical back:  Normal range of motion.     Right lower leg: No edema.     Left lower leg: No edema.  Skin:    General: Skin is warm and dry.  Neurological:     General: No focal deficit present.     Mental Status: He is alert and oriented to person, place, and time.      Labs on Admission: I have personally reviewed following labs and imaging studies  CBC: Recent Labs  Lab 01/05/23 1309 01/06/23 0428 01/07/23 1834  WBC 13.4* 13.0* 13.1*  NEUTROABS 9.7*  --  10.7*  HGB 15.2 13.5 14.0  HCT 43.4 39.1 38.9*  MCV 96.9 99.0 96.8  PLT 309 265 250   Basic Metabolic Panel: Recent Labs  Lab 01/05/23 1309 01/06/23 0428 01/07/23 1834  NA 135 134* 130*  K 3.4* 3.5 3.4*  CL 100 101 98  CO2 22 24 21*  GLUCOSE 139* 125* 144*  BUN 15 11 13   CREATININE 0.85 0.81 0.75  CALCIUM 9.4 8.9 8.4*  MG 1.8  --   --    GFR: Estimated Creatinine Clearance: 104.2 mL/min (by C-G formula based on SCr of 0.75 mg/dL). Liver Function Tests: Recent Labs  Lab 01/07/23 1834  AST 13*  ALT 19  ALKPHOS 82  BILITOT 1.8*  PROT 7.3  ALBUMIN 3.5   No results for input(s): "LIPASE", "AMYLASE" in the last 168 hours. No results for input(s): "AMMONIA" in the last 168 hours. Coagulation Profile: No results for input(s): "INR", "PROTIME" in the last 168 hours. Cardiac Enzymes: No results for input(s): "CKTOTAL", "CKMB", "CKMBINDEX", "TROPONINI" in the last 168 hours. BNP (last 3 results) No results for input(s): "PROBNP" in the last 8760 hours. HbA1C: No results for  input(s): "HGBA1C" in the last 72 hours. CBG: No results for input(s): "GLUCAP" in the last 168 hours. Lipid Profile: No results for input(s): "CHOL", "HDL", "LDLCALC", "TRIG", "CHOLHDL", "LDLDIRECT" in the last 72 hours. Thyroid Function Tests: No results for input(s): "TSH", "T4TOTAL", "FREET4", "T3FREE", "THYROIDAB" in the last 72 hours. Anemia Panel: No results for input(s): "VITAMINB12", "FOLATE", "FERRITIN", "TIBC", "IRON", "RETICCTPCT" in  the last 72 hours. Urine analysis:    Component Value Date/Time   COLORURINE YELLOW 01/07/2023 2232   APPEARANCEUR HAZY (A) 01/07/2023 2232   LABSPEC 1.014 01/07/2023 2232   PHURINE 7.0 01/07/2023 2232   GLUCOSEU NEGATIVE 01/07/2023 2232   HGBUR MODERATE (A) 01/07/2023 2232   HGBUR negative 12/14/2009 0823   BILIRUBINUR NEGATIVE 01/07/2023 2232   BILIRUBINUR neg 09/14/2020 1459   KETONESUR NEGATIVE 01/07/2023 2232   PROTEINUR 30 (A) 01/07/2023 2232   UROBILINOGEN 1.0 09/14/2020 1459   UROBILINOGEN 1.0 03/12/2012 1250   NITRITE POSITIVE (A) 01/07/2023 2232   LEUKOCYTESUR MODERATE (A) 01/07/2023 2232    Radiological Exams on Admission: CT Angio Chest PE W and/or Wo Contrast  Result Date: 01/07/2023 CLINICAL DATA:  Shortness of breath EXAM: CT ANGIOGRAPHY CHEST WITH CONTRAST TECHNIQUE: Multidetector CT imaging of the chest was performed using the standard protocol during bolus administration of intravenous contrast. Multiplanar CT image reconstructions and MIPs were obtained to evaluate the vascular anatomy. RADIATION DOSE REDUCTION: This exam was performed according to the departmental dose-optimization program which includes automated exposure control, adjustment of the mA and/or kV according to patient size and/or use of iterative reconstruction technique. CONTRAST:  80mL OMNIPAQUE IOHEXOL 350 MG/ML SOLN COMPARISON:  Chest x-ray from earlier in the same day. FINDINGS: Cardiovascular: Atherosclerotic calcifications of the thoracic aorta are noted. No aneurysmal dilatation is noted. Coronary calcifications are seen. The pulmonary artery shows a normal branching pattern bilaterally. No filling defect to suggest pulmonary embolism is noted. Mediastinum/Nodes: Thoracic inlet is within normal limits. No hilar or mediastinal adenopathy is noted. The esophagus as visualized is within normal limits. Lungs/Pleura: Mild bibasilar atelectatic changes are noted. No focal nodular changes are seen. No focal  confluent infiltrate is noted. Upper Abdomen: Visualized upper abdomen shows scattered hypodensities throughout the liver similar to that seen on the prior exam most consistent with cysts. Stable right adrenal adenoma is noted similar to that seen on the prior exam Musculoskeletal: Degenerative changes of the thoracic spine are noted. No acute abnormality noted. Review of the MIP images confirms the above findings. IMPRESSION: No evidence of pulmonary emboli. Mild bibasilar atelectatic changes. Stable cysts within the liver. Right adrenal lesion is noted considered a long-term stable adrenal adenoma over multiple previous exams. Aortic Atherosclerosis (ICD10-I70.0). Electronically Signed   By: Alcide Clever M.D.   On: 01/07/2023 23:52   DG Chest Portable 1 View  Result Date: 01/07/2023 CLINICAL DATA:  Fever EXAM: PORTABLE CHEST 1 VIEW COMPARISON:  01/05/2023 FINDINGS: Cardiomegaly. Mediastinal contours within normal limits. Vascular congestion. Low lung volumes with left base atelectasis. No overt edema or effusions. No acute bony abnormality. IMPRESSION: Low lung volumes with left base atelectasis. Cardiomegaly, vascular congestion. Electronically Signed   By: Charlett Nose M.D.   On: 01/07/2023 19:30    EKG: Independently reviewed.  Sinus tachycardia, no acute ischemic changes.  Assessment and Plan  Catheter associated urinary tract infection SIRS/possible sepsis Patient with history of neurogenic bladder with suprapubic catheter in place since November 2024.  Recently admitted 7/20-7/21 due to obstruction of his suprapubic catheter and it was exchanged  by urology.  He is slightly tachycardic and tachypneic.  WBC count 13.1.  Lactic acid normal x 2 and not hypotensive.  UA with positive nitrite, moderate leukocytes, and microscopy showing 21-50 RBCs, 21-50 WBCs, and many bacteria.  Continue ceftriaxone and gentle IV fluid hydration.  Blood and urine cultures pending.  Monitor WBC count.  Urology  consulted.  Shortness of breath and mild hypoxia Satting 89% on room air with EMS and initially required 2 L [place].  At present, he is satting in the mid 90s on room air and no signs of respiratory distress.  Not wheezing.  CTA chest negative for PE and showing mild bibasilar atelectatic changes.  Mild hyponatremia Continue gentle IV fluid hydration with normal saline and monitor labs.  Mild hypokalemia Monitor potassium and magnesium levels, continue to replace as needed.  Elevated BNP Echo done in December 2023 was showing EF 60 to 65% and diastolic function could not be evaluated.  BNP 226.  Patient does not appear volume overloaded.  CTA chest negative for PE.    Paroxysmal A-fib Hold Xarelto until patient is evaluated by urology.  Continue Cardizem after pharmacy med rec is done.  History of stroke in December 2023 Hypertension Hyperlipidemia BPH Anxiety/depression Pharmacy med rec pending.  DVT prophylaxis: SCDs Code Status: Full Code (discussed with the patient and his family) Family Communication: Patient's ex-wife and daughter at bedside. Consults called: Urology Level of care: Progressive Care Unit Admission status: It is my clinical opinion that referral for OBSERVATION is reasonable and necessary in this patient based on the above information provided. The aforementioned taken together are felt to place the patient at high risk for further clinical deterioration. However, it is anticipated that the patient may be medically stable for discharge from the hospital within 24 to 48 hours.   John Giovanni MD [facility]  If 7PM-7AM, please contact night-coverage [email or web address]  01/08/2023, 4:28 AM

## 2023-01-08 NOTE — Plan of Care (Signed)

## 2023-01-08 NOTE — Telephone Encounter (Signed)
Pt daughter called in bc her dad is in the hospital. Cancelled his Pft and rescheduled it to oct 9th. Pt daughter wants to know if he can have it done while hospitalized.

## 2023-01-08 NOTE — Progress Notes (Signed)
See note from my partner Dr. Loney Loh from overnight who admitted this patient  Briefly 77 year old white male chronic bladder outlet obstruction BPH with chronic Foley previously and history of recurrent UTIs Prior EtOH]  prior nephrolithiasis-prior epididymoorchitis back in 2017-hypogonadism previously treated with testosterone??  Per PCP Anxiety/depression Previous LLE stress fracture Prior ischemic CVA 05/27/2022 admission Significant presbycusis  Underwent suprapubic catheter placement in OR 12/17/2022 had issues with urinary retention sediment-admitted previously 7/20-7/22 with pain and bladder after suprapubic found to have new onset A-fib RVR That hospitalization seen by cardiology for A-fib RVR (reverted to NSR-recommended Xarelto 20 daily rate control Cardizem by Dr. Ladona Ridgel and was stabilized for discharge) Dr. Mena Goes saw the patient-attempted to instrument the encrusted suprapubic suprapubic ultimately exchanged catheter to an 76 [place] without difficulty into the bladder again Patient was discharged home in a stable state-antibiotics were not warranted as he was colonized and patient had no fever chills or other issues  Represent as per above  Daughter gives most of h/o  BP (!) 155/93 (BP Location: Left Arm)   Pulse (!) 50   Temp 99.8 F (37.7 C) (Oral)   Resp (!) 21   Ht 6' (1.829 m)   Wt 114.3 kg   SpO2 94%   BMI 34.18 kg/m   Awakens, sleepy tho S1 s 2 afib, PVC, SVT on monitor 4-5 bt Abd soft Suprapubic stoma macerated, relatively clean otherwise Abd soft no rebound  P IV abx, resume rate control Cardizem 180 Hold lasix--unclear if needs Flomax/finasteride and held  Resume sertraline/flexeril when more awake  If no procedures per uro--resume Xarelto    No charge  Pleas Koch, MD [facility] 10:55 AM

## 2023-01-09 ENCOUNTER — Ambulatory Visit (HOSPITAL_BASED_OUTPATIENT_CLINIC_OR_DEPARTMENT_OTHER): Payer: Medicare HMO | Admitting: Pulmonary Disease

## 2023-01-09 DIAGNOSIS — N390 Urinary tract infection, site not specified: Secondary | ICD-10-CM | POA: Diagnosis not present

## 2023-01-09 DIAGNOSIS — T83511A Infection and inflammatory reaction due to indwelling urethral catheter, initial encounter: Secondary | ICD-10-CM | POA: Diagnosis not present

## 2023-01-09 LAB — URINE CULTURE

## 2023-01-09 LAB — CULTURE, BLOOD (ROUTINE X 2): Culture: NO GROWTH

## 2023-01-09 MED ORDER — FUROSEMIDE 10 MG/ML IJ SOLN
40.0000 mg | Freq: Once | INTRAMUSCULAR | Status: AC
  Administered 2023-01-09: 40 mg via INTRAVENOUS
  Filled 2023-01-09: qty 4

## 2023-01-09 MED ORDER — BANATROL TF EN LIQD
60.0000 mL | Freq: Two times a day (BID) | ENTERAL | Status: DC
  Filled 2023-01-09: qty 60

## 2023-01-09 MED ORDER — BANATROL TF EN LIQD
60.0000 mL | Freq: Two times a day (BID) | ENTERAL | Status: DC
  Administered 2023-01-10 – 2023-01-19 (×5): 60 mL via ORAL
  Filled 2023-01-09 (×20): qty 60

## 2023-01-09 NOTE — Progress Notes (Signed)
Transition of Care [facility]) - Inpatient Brief Assessment   Patient Details  Name: Calvin [name] [name]: [id number] Date of Birth: 03-Mar-1946  [facility]) CM/SW Contact:    Larrie Kass, LCSW Phone Number: 01/09/2023, 12:01 PM   Clinical Narrative: Transition of Care Department [facility]) has reviewed patient and no TOC needs have been identified at this time. We will continue to monitor patient advancement through interdisciplinary progression rounds. If new patient transition needs arise, please place a TOC consult.   Transition of Care Asessment: Insurance and Status: Insurance coverage has been reviewed Patient has primary care physician: Yes Home environment has been reviewed: yes Prior level of function:: modified independent Prior/Current Home Services: Current home services Social Determinants of Health Reivew: SDOH reviewed no interventions necessary Readmission risk has been reviewed: Yes Transition of care needs: no transition of care needs at this time

## 2023-01-09 NOTE — Progress Notes (Signed)
PROGRESS NOTE  Calvin [name]  DOB: 18-Aug-1945  PCP: [name], MD YQI:[id number]  DOA: 01/07/2023  LOS: 1 day  Hospital Day: 3  Brief narrative: Calvin [name] is a 77 y.o. male with PMH significant for HTN, HLD, ischemic stroke 12/23, BPH, neurogenic bladder, suprapubic catheter in place, anxiety/depression. Recently admitted 7/20-7/21 due to obstruction of his suprapubic catheter for which he was evaluated by urology and found to have encrusted suprapubic tube which was exchanged.  He was also found to have new onset A-fib with RVR and was discharged on oral Cardizem and Xarelto. Next day, 7/22, patient was brought to the ED with a fever 100.9, lethargy and redness and malodor noted at the site of suprapubic catheter insertion.  In the ED, he was afebrile but tachycardic, tachypneic, he was noted to have a low O2 sat of 89% on room air, placed on 2 L oxygen nasal cannula. Labs showed WBC 13.1, sodium 130, lactate normal x 2, blood cultures drawn.   UA with positive nitrite, moderate leukocytes, and microscopy showing 21-50 RBCs, 21-50 WBCs, and many bacteria.  Urine culture pending.  CTA chest negative for PE and showing mild bibasilar atelectatic changes.   ED physician consulted urology (Dr. Liliane Shi) who recommended starting antibiotics to treat for UTI and will consult in the morning.   Patient was started on IV ceftriaxone.  Subjective: Patient was seen and examined this morning.  At the time of my evaluation, patient was somnolent, tried to open eyes on verbal command but was lethargic.  Nurse at bedside stated that he was able to swallow pills earlier. Chart reviewed In the last 24 hours, patient afebrile, heart rate in 90s and 100s, blood pressure 150s this morning, breathing on room air No labs this morning.  Assessment and plan: UTI associated with suprapubic catheter Sepsis - POA Neurogenic bladder s/p suprapubic catheter  BPH Reported fever at home Noted to have  tachycardia, tachypnea, lethargy, UTI, and malodorous suprapubic catheter insertion site Currently on IV Rocephin Urology consulted.  I spoke with urology NP Sheria Lang this afternoon.  He will see him today. Blood cultures no growth so far.  Urine culture showed multiple species present Continue Proscar, Flomax Recent Labs  Lab 01/05/23 1309 01/06/23 0428 01/07/23 1834 01/07/23 1839 01/07/23 2105 01/08/23 0722  WBC 13.4* 13.0* 13.1*  --   --  12.0*  LATICACIDVEN  --   --   --  0.7 0.7  --    Acute metabolic encephalopathy Lethargic this morning likely due to infection. Continue to monitor  Chronic diastolic CHF Essential hypertension Echo done in December 2023 was showing EF 60 to 65% and diastolic function could not be evaluated.  BNP 226.   Patient does not appear volume overloaded.   PTA meds-Lasix 20 mg daily.  Currently on hold   Paroxysmal A-fib Continue Cardizem.  Metoprolol as needed Continue to hold Xarelto until patient is evaluated by urology.  Shortness of breath and mild hypoxia Low initial O2 sat likely due to atelectasis as seen in CT chest.  CTA chest negative for PE.    Hyperlipidemia Lipitor   Mild hyponatremia Continue gentle IV fluid hydration with normal saline Recent Labs  Lab 01/05/23 1309 01/06/23 0428 01/07/23 1834  NA 135 134* 130*   Mild hypokalemia Potassium level was low and replaced.  Recheck Recent Labs  Lab 01/05/23 1309 01/06/23 0428 01/07/23 1834 01/08/23 0800  K 3.4* 3.5 3.4*  --   MG 1.8  --   --  2.0   Anxiety/depression Not on meds  Diarrhea Currently on Imodium twice daily.  Check C. difficile assay   Mobility: PT eval  Goals of care   Code Status: Full Code     DVT prophylaxis:  SCDs Start: 01/08/23 0506 rivaroxaban (XARELTO) tablet 20 mg   Antimicrobials: IV Rocephin Fluid: None currently Consultants: Urology Family Communication: None at bedside  Status: Inpatient Level of care:  Progressive    Patient from: Home Anticipated d/c to: Pending clinical course Needs to continue in-hospital care:  Antibiotics to continue   Diet:  Diet Order             Diet regular Fluid consistency: Thin  Diet effective now                   Scheduled Meds:  diltiazem  180 mg Oral Daily   loperamide  2 mg Oral BID   rivaroxaban  20 mg Oral Q supper    PRN meds: acetaminophen **OR** acetaminophen, metoprolol tartrate, ondansetron (ZOFRAN) IV   Infusions:   cefTRIAXone (ROCEPHIN)  IV 1 g (01/08/23 1804)    Antimicrobials: Anti-infectives (From admission, onward)    Start     Dose/Rate Route Frequency Ordered Stop   01/08/23 1900  cefTRIAXone (ROCEPHIN) 1 g in sodium chloride 0.9 % 100 mL IVPB        1 g 200 mL/hr over 30 Minutes Intravenous Every 24 hours 01/08/23 0507     01/07/23 1945  cefTRIAXone (ROCEPHIN) 1 g in sodium chloride 0.9 % 100 mL IVPB        1 g 200 mL/hr over 30 Minutes Intravenous  Once 01/07/23 1939 01/07/23 2015       Nutritional status:  Body mass index is 34.18 kg/m.          Objective: Vitals:   01/09/23 0418 01/09/23 1140  BP: (!) 143/95 (!) 150/97  Pulse: 96   Resp: 16 (!) 22  Temp: 99.7 F (37.6 C) 98.6 F (37 C)  SpO2: 96% 96%    Intake/Output Summary (Last 24 hours) at 01/09/2023 1314 Last data filed at 01/09/2023 0850 Gross per 24 hour  Intake 760.89 ml  Output 215 ml  Net 545.89 ml   Filed Weights   01/07/23 2016  Weight: 114.3 kg   Weight change:  Body mass index is 34.18 kg/m.   Physical Exam: General exam: Pleasant, elderly Caucasian male.  Not in distress Skin: No rashes, lesions or ulcers. HEENT: Atraumatic, normocephalic, no obvious bleeding Lungs: Clear to auscultation bilaterally CVS: Regular rate and rhythm, no murmur GI/Abd soft, nondistended, bowel sound present, suprapubic catheter site with granulation tissue and crusts CNS: Somnolent, opens eyes to answer briefly Psychiatry: Sad  affect Extremities: No pedal edema, no calf tenderness  Data Review: I have personally reviewed the laboratory data and studies available.  F/u labs ordered Unresulted Labs (From admission, onward)     Start     Ordered   01/10/23 0500  CBC with Differential/Platelet  Daily,   R      01/09/23 1314   01/10/23 0500  Basic metabolic panel  Daily,   R      01/09/23 1314   01/09/23 1301  C Difficile Quick Screen w PCR reflex  (C Difficile quick screen w PCR reflex panel )  Once, for 24 hours,   TIMED       References:    CDiff Information Tool   01/09/23 1300  Total time spent in review of labs and imaging, patient evaluation, formulation of plan, documentation and communication with family: 55 minutes  Signed, Lorin Glass, MD [facility] 01/09/2023

## 2023-01-09 NOTE — Progress Notes (Signed)
Mobility Specialist - Progress Note  Pre-mobility: 149/75 mmHg (96) MAP BP,    01/09/23 1537  Mobility  Activity Ambulated with assistance in room  Level of Assistance Contact guard assist, steadying assist  Assistive Device Front wheel walker  Distance Ambulated (ft) 20 ft  Range of Motion/Exercises Active  Activity Response Tolerated fair  Mobility Referral Yes  $Mobility charge 1 Mobility  Mobility Specialist Start Time (ACUTE ONLY) 1500  Mobility Specialist Stop Time (ACUTE ONLY) 1537  Mobility Specialist Time Calculation (min) (ACUTE ONLY) 37 min   Pt was found in bed and agreeable to try ambulate. Pt able to ambulate limited distance due to fatigue and heavy breathing with any exertion. Was a bit unsteady and needing cues for posture. At EOS returned to bed with all needs met. Call bell in reach and RN notified of session.  Billey Chang Mobility Specialist

## 2023-01-09 NOTE — Consult Note (Signed)
Urology Consult Note   Requesting Attending Physician:  Lorin Glass, MD Service Providing Consult: Urology  Consulting Attending: Dr.Hall   Reason for Consult: S/p suprapubic tube.  HPI: Calvin [name] is seen in consultation for reasons noted above at the request of Dahal, Melina Schools, MD  This is a 77 y.o. male with patient is a 77 year old male with PMH of ischemic stroke in 12/23, now with neurogenic bladder and SPT placed 04/27/2023.  His SP tube became obstructed and was exchanged by Dr. Mena Goes in the ED on 7/21.  The tube had become encrusted at the time.  Patient return to the hospital via EMS for fever, malodorous urine, cough, shortness of breath, and generalized weakness.  I was contacted that day to inspect the suprapubic tube, which was healing appropriately.  I have been requested to return and formally consult on the state of the suprapubic tube again today.   Past Medical History: Past Medical History:  Diagnosis Date   Anxiety    Benign prostatic hypertrophy    COVID-19 virus infection 02/24/2020   Depression    ED (erectile dysfunction)    Headache(784.0)    History of kidney stones    Hypertension    Hypogonadism male    Low back pain 06/09/2020   Nephrolithiasis    hx of had lithotripsy in 1-10.   NEPHROLITHIASIS, HX OF 10/25/2008   Qualifier: Diagnosis of   By: Clent Ridges MD, Tera Mater      Neuromuscular disorder [facility]: [facility] Rhonda W)    neuropaty legs   Pneumonia    Pyelonephritis 02/25/2022   Stroke [facility])    fall 2023 some memory issues like time of day    Past Surgical History:  Past Surgical History:  Procedure Laterality Date   colonoscopy  10/05/2020   per Dr. Russella Dar, adenomatous polyps, repeat in 3 yrs   CYSTOSCOPY W/ RETROGRADES Bilateral 12/25/2022   Procedure: CYSTOSCOPY, TRANSURETHERAL RESECTION OF PROSTATE;  Surgeon: Noel Christmas, MD;  Location: [facility] ORS;  Service: Urology;  Laterality: Bilateral;  90 MINS FRO CASE   FOOT SURGERY     INSERTION OF SUPRAPUBIC  CATHETER N/A 12/25/2022   Procedure: INSERTION OF SUPRAPUBIC CATHETER;  Surgeon: Noel Christmas, MD;  Location: [facility];  Service: Urology;  Laterality: N/A;   LAPAROSCOPIC APPENDECTOMY  03/12/2012   Procedure: APPENDECTOMY LAPAROSCOPIC;  Surgeon: Shelly Rubenstein, MD;  Location: [facility] OR;  Service: General;  Laterality: N/A;   STAPLE HEMORRHOIDECTOMY  04/14/2010   per Dr. Michaell Cowing    TONSILLECTOMY      Medication: Current Facility-Administered Medications  Medication Dose Route Frequency Provider Last Rate Last Admin   acetaminophen (TYLENOL) tablet 650 mg  650 mg Oral Q6H PRN John Giovanni, MD       Or   acetaminophen (TYLENOL) suppository 650 mg  650 mg Rectal Q6H PRN John Giovanni, MD       cefTRIAXone (ROCEPHIN) 1 g in sodium chloride 0.9 % 100 mL IVPB  1 g Intravenous Q24H John Giovanni, MD 200 mL/hr at 01/08/23 1804 1 g at 01/08/23 1804   diltiazem (CARDIZEM CD) 24 hr capsule 180 mg  180 mg Oral Daily Rhetta Mura, MD   180 mg at 01/09/23 1132   loperamide (IMODIUM) capsule 2 mg  2 mg Oral BID Rhetta Mura, MD   2 mg at 01/09/23 1131   metoprolol tartrate (LOPRESSOR) injection 5 mg  5 mg Intravenous Q4H PRN Rhetta Mura, MD       ondansetron (ZOFRAN) injection 4  mg  4 mg Intravenous Q6H PRN John Giovanni, MD       rivaroxaban Carlena Hurl) tablet 20 mg  20 mg Oral Q supper Rhetta Mura, MD   20 mg at 01/08/23 1154    Allergies: No Known Allergies  Social History: Social History   Tobacco Use   Smoking status: Never   Smokeless tobacco: Never  Vaping Use   Vaping status: Never Used  Substance Use Topics   Alcohol use: Not Currently    Alcohol/week: 14.0 standard drinks of alcohol    Types: 14 Standard drinks or equivalent per week    Comment: daily one drink   Drug use: No    Family History Family History  Problem Relation Age of Onset   Heart Problems Mother        Caused by a Virus when she was a teen   Stroke Father     Prostate cancer Father    Hypertension Other    Stroke Other    Heart disease Other        rheumatic    Coronary artery disease Other    Healthy Child    Colon cancer Neg Hx    Colon polyps Neg Hx    Esophageal cancer Neg Hx    Rectal cancer Neg Hx    Stomach cancer Neg Hx     ROS   Objective   Vital signs in last 24 hours: BP (!) 150/97 (BP Location: Right Arm)   Pulse 96   Temp 98.6 F (37 C) (Oral)   Resp (!) 22   Ht 6' (1.829 m)   Wt 114.3 kg   SpO2 96%   BMI 34.18 kg/m   Physical Exam General: NAD, A&O, resting, appropriate HEENT: [place]/AT Pulmonary: Normal work of breathing Cardiovascular: RRR, no cyanosis Abdomen: Soft, NTTP, nondistended GU: SPT in place draining clear yellow urine. Neuro: Appropriate, no focal neurological deficits  Most Recent Labs: Lab Results  Component Value Date   WBC 12.0 (H) 01/08/2023   HGB 13.7 01/08/2023   HCT 39.3 01/08/2023   PLT 248 01/08/2023    Lab Results  Component Value Date   NA 130 (L) 01/07/2023   K 3.4 (L) 01/07/2023   CL 98 01/07/2023   CO2 21 (L) 01/07/2023   BUN 13 01/07/2023   CREATININE 0.75 01/07/2023   CALCIUM 8.4 (L) 01/07/2023   MG 2.0 01/08/2023   PHOS 4.0 10/29/2022    Lab Results  Component Value Date   INR 1.1 05/27/2022   APTT 30 05/27/2022     Urine Culture: @[id number] (laburin,org,[id number],[id number])@   IMAGING: ECHOCARDIOGRAM COMPLETE  Result Date: 01/08/2023    ECHOCARDIOGRAM REPORT   Patient Name:   Calvin [name] Date of Exam: 01/08/2023 Medical Rec #:  [id number]      Height:       72.0 in Accession #:    [id number]     Weight:       252.0 lb Date of Birth:  1945-07-31      BSA:          2.350 m Patient Age:    77 years       BP:           155/93 mmHg Patient Gender: M              HR:           102 bpm. Exam Location:  Inpatient Procedure: 2D Echo, Color Doppler, Cardiac Doppler and Intracardiac  Opacification Agent Indications:    Elevated BNP  History:        Patient has  prior history of Echocardiogram examinations, most                 recent 05/28/2022. Stroke and h/o ETOH abuse; Risk                 Factors:Hypertension and Dyslipidemia.  Sonographer:    Milbert Coulter Referring Phys: [id number] ZHYQMVHQI RATHORE  Sonographer Comments: Technically difficult study due to poor echo windows, suboptimal parasternal window, suboptimal apical window and patient is obese. Image acquisition challenging due to patient body habitus and Image acquisition challenging due to respiratory motion. IMPRESSIONS  1. Left ventricular ejection fraction, by estimation, is 60 to 65%. The left ventricle has normal function. The left ventricle has no regional wall motion abnormalities. There is mild concentric left ventricular hypertrophy. Left ventricular diastolic parameters are indeterminate.  2. Right ventricular systolic function is normal. The right ventricular size is normal.  3. The mitral valve is grossly normal. No evidence of mitral valve regurgitation. No evidence of mitral stenosis.  4. The aortic valve was not well visualized. Aortic valve regurgitation is not visualized. No aortic stenosis is present.  5. There is borderline dilatation of the ascending aorta, measuring 38 mm.  6. The inferior vena cava is normal in size with greater than 50% respiratory variability, suggesting right atrial pressure of 3 mmHg. Comparison(s): No significant change from prior study. Prior images reviewed side by side. Poor quality images, but no overt regional wall motion abnormalities are seen. Incessant ectopy limits evaluation of mitral inflow/diastolic parameters. FINDINGS  Left Ventricle: Left ventricular ejection fraction, by estimation, is 60 to 65%. The left ventricle has normal function. The left ventricle has no regional wall motion abnormalities. Definity contrast agent was given IV to delineate the left ventricular  endocardial borders. The left ventricular internal cavity size was normal in size.  There is mild concentric left ventricular hypertrophy. Left ventricular diastolic parameters are indeterminate. Right Ventricle: The right ventricular size is normal. No increase in right ventricular wall thickness. Right ventricular systolic function is normal. Left Atrium: Left atrial size was normal in size. Right Atrium: Right atrial size was normal in size. Pericardium: There is no evidence of pericardial effusion. Mitral Valve: The mitral valve is grossly normal. No evidence of mitral valve regurgitation. No evidence of mitral valve stenosis. Tricuspid Valve: The tricuspid valve is not well visualized. Tricuspid valve regurgitation is not demonstrated. No evidence of tricuspid stenosis. Aortic Valve: The aortic valve was not well visualized. Aortic valve regurgitation is not visualized. No aortic stenosis is present. Aortic valve mean gradient measures 5.0 mmHg. Aortic valve peak gradient measures 8.8 mmHg. Aortic valve area, by VTI measures 2.30 cm. Pulmonic Valve: The pulmonic valve was not well visualized. Pulmonic valve regurgitation is not visualized. No evidence of pulmonic stenosis. Aorta: The aortic root is normal in size and structure. There is borderline dilatation of the ascending aorta, measuring 38 mm. Venous: The inferior vena cava is normal in size with greater than 50% respiratory variability, suggesting right atrial pressure of 3 mmHg. IAS/Shunts: No atrial level shunt detected by color flow Doppler.  LEFT VENTRICLE PLAX 2D LVIDd:         5.10 cm LVIDs:         3.10 cm LV PW:         1.40 cm LV IVS:        1.40 cm  LVOT diam:     2.10 cm LV SV:         60 LV SV Index:   25 LVOT Area:     3.46 cm  RIGHT VENTRICLE RV S prime:     22.00 cm/s TAPSE (M-mode): 2.0 cm LEFT ATRIUM             Index        RIGHT ATRIUM           Index LA Vol (A2C):   65.4 ml 27.82 ml/m  RA Area:     17.80 cm LA Vol (A4C):   70.4 ml 29.95 ml/m  RA Volume:   46.80 ml  19.91 ml/m LA Biplane Vol: 73.9 ml 31.44 ml/m   AORTIC VALVE AV Area (Vmax):    2.22 cm AV Area (Vmean):   2.20 cm AV Area (VTI):     2.30 cm AV Vmax:           148.00 cm/s AV Vmean:          106.000 cm/s AV VTI:            0.259 m AV Peak Grad:      8.8 mmHg AV Mean Grad:      5.0 mmHg LVOT Vmax:         94.70 cm/s LVOT Vmean:        67.300 cm/s LVOT VTI:          0.172 m LVOT/AV VTI ratio: 0.66  AORTA Ao Asc diam: 3.80 cm  SHUNTS Systemic VTI:  0.17 m Systemic Diam: 2.10 cm Rachelle Hora Croitoru MD Electronically signed by Thurmon Fair MD Signature Date/Time: 01/08/2023/11:09:39 AM    Final    CT Angio Chest PE W and/or Wo Contrast  Result Date: 01/07/2023 CLINICAL DATA:  Shortness of breath EXAM: CT ANGIOGRAPHY CHEST WITH CONTRAST TECHNIQUE: Multidetector CT imaging of the chest was performed using the standard protocol during bolus administration of intravenous contrast. Multiplanar CT image reconstructions and MIPs were obtained to evaluate the vascular anatomy. RADIATION DOSE REDUCTION: This exam was performed according to the departmental dose-optimization program which includes automated exposure control, adjustment of the mA and/or kV according to patient size and/or use of iterative reconstruction technique. CONTRAST:  80mL OMNIPAQUE IOHEXOL 350 MG/ML SOLN COMPARISON:  Chest x-ray from earlier in the same day. FINDINGS: Cardiovascular: Atherosclerotic calcifications of the thoracic aorta are noted. No aneurysmal dilatation is noted. Coronary calcifications are seen. The pulmonary artery shows a normal branching pattern bilaterally. No filling defect to suggest pulmonary embolism is noted. Mediastinum/Nodes: Thoracic inlet is within normal limits. No hilar or mediastinal adenopathy is noted. The esophagus as visualized is within normal limits. Lungs/Pleura: Mild bibasilar atelectatic changes are noted. No focal nodular changes are seen. No focal confluent infiltrate is noted. Upper Abdomen: Visualized upper abdomen shows scattered hypodensities  throughout the liver similar to that seen on the prior exam most consistent with cysts. Stable right adrenal adenoma is noted similar to that seen on the prior exam Musculoskeletal: Degenerative changes of the thoracic spine are noted. No acute abnormality noted. Review of the MIP images confirms the above findings. IMPRESSION: No evidence of pulmonary emboli. Mild bibasilar atelectatic changes. Stable cysts within the liver. Right adrenal lesion is noted considered a long-term stable adrenal adenoma over multiple previous exams. Aortic Atherosclerosis (ICD10-I70.0). Electronically Signed   By: Alcide Clever M.D.   On: 01/07/2023 23:52   DG Chest Portable 1 View  Result Date: 01/07/2023  CLINICAL DATA:  Fever EXAM: PORTABLE CHEST 1 VIEW COMPARISON:  01/05/2023 FINDINGS: Cardiomegaly. Mediastinal contours within normal limits. Vascular congestion. Low lung volumes with left base atelectasis. No overt edema or effusions. No acute bony abnormality. IMPRESSION: Low lung volumes with left base atelectasis. Cardiomegaly, vascular congestion. Electronically Signed   By: Charlett Nose M.D.   On: 01/07/2023 19:30    ------  Assessment:  77 y.o. male with suprapubic tube   Recommendations:  # S/P SPT Imaging has been included in the media tab as a margin of reference for the future.  There is some fibrinous exudate on the lateral aspects of the incision where the tube lays trapped between his stomach.  This is probably slowing the healing to some degree but is not overly concerning on examination.  Neither time I have inspected him has his wound had any malodor or purulent drainage.  There is a little bit of surrounding erythema.  I would recommend that this area is regularly cleaned and dried.  Split gauze can be placed around the tubing in order to keep the pressure off of the lateral aspects of the wound.  No acute surgical interventions are required at this time.  The secondary effects of the antibiotics he  is already taking would be adequate to take care of any superficial infection he may have.  Urology will sign off at this time.  Please have patient follow-up in clinic for SPT exchange, on or about 02/06/2023.  Case and plan discussed with Dr.Hall  Elmon Kirschner, NP Pager: [phone]   Please contact the urology consult pager with any further questions/concerns.

## 2023-01-10 DIAGNOSIS — N390 Urinary tract infection, site not specified: Secondary | ICD-10-CM | POA: Diagnosis not present

## 2023-01-10 DIAGNOSIS — N368 Other specified disorders of urethra: Secondary | ICD-10-CM

## 2023-01-10 DIAGNOSIS — T83511A Infection and inflammatory reaction due to indwelling urethral catheter, initial encounter: Secondary | ICD-10-CM | POA: Diagnosis not present

## 2023-01-10 LAB — BASIC METABOLIC PANEL
Anion gap: 10 (ref 5–15)
CO2: 25 mmol/L (ref 22–32)
Calcium: 8.6 mg/dL — ABNORMAL LOW (ref 8.9–10.3)
Chloride: 100 mmol/L (ref 98–111)
Creatinine, Ser: 0.77 mg/dL (ref 0.61–1.24)
GFR, Estimated: >60 mL/min (ref >60)
Glucose, Bld: 116 mg/dL — ABNORMAL HIGH (ref 70–99)
Potassium: 3.2 mmol/L — ABNORMAL LOW (ref 3.5–5.1)
Sodium: 135 mmol/L (ref 135–145)

## 2023-01-10 LAB — CULTURE, BLOOD (ROUTINE X 2)

## 2023-01-10 LAB — CBC WITH DIFFERENTIAL/PLATELET
Eosinophils Absolute: 0.2 10*3/uL (ref 0.0–0.5)
Eosinophils Relative: 3 %
HCT: 39.3 % (ref 39.0–52.0)
Hemoglobin: 13.4 g/dL (ref 13.0–17.0)
Lymphocytes Relative: 16 %
Lymphs Abs: 1.5 10*3/uL (ref 0.7–4.0)
MCH: 33.3 pg (ref 26.0–34.0)
MCHC: 34.1 g/dL (ref 30.0–36.0)
MCV: 97.8 fL (ref 80.0–100.0)
Monocytes Absolute: 1 10*3/uL (ref 0.1–1.0)
Monocytes Relative: 11 %
Neutro Abs: 6.3 10*3/uL (ref 1.7–7.7)
Neutrophils Relative %: 68 %
Platelets: 286 10*3/uL (ref 150–400)
RBC: 4.02 MIL/uL — ABNORMAL LOW (ref 4.22–5.81)
RDW: 12.1 % (ref 11.5–15.5)
WBC: 9.1 10*3/uL (ref 4.0–10.5)
nRBC: 0 % (ref 0.0–0.2)

## 2023-01-10 MED ORDER — POTASSIUM CHLORIDE CRYS ER 20 MEQ PO TBCR
40.0000 meq | EXTENDED_RELEASE_TABLET | Freq: Once | ORAL | Status: AC
  Administered 2023-01-10: 40 meq via ORAL
  Filled 2023-01-10: qty 2

## 2023-01-10 NOTE — Progress Notes (Addendum)
PROGRESS NOTE  Calvin [name]  DOB: [birth year]  PCP: [name], MD QMG:[id number]  DOA: 01/07/2023  LOS: 2 days  Hospital Day: 4  Brief narrative: Calvin [name] is a 77 y.o. male with PMH significant for HTN, HLD, ischemic stroke 12/23, BPH, neurogenic bladder, suprapubic catheter in place, anxiety/depression. Recently admitted 7/20-7/21 due to obstruction of his suprapubic catheter for which he was evaluated by urology and found to have encrusted suprapubic tube which was exchanged.  He was also found to have new onset A-fib with RVR and was discharged on oral Cardizem and Xarelto. Next day, 7/22, patient was brought to the ED with a fever 100.9, lethargy and redness and malodor noted at the site of suprapubic catheter insertion.  In the ED, he was afebrile but tachycardic, tachypneic, he was noted to have a low O2 sat of 89% on room air, placed on 2 L oxygen nasal cannula. Labs showed WBC 13.1, sodium 130, lactate normal x 2, blood cultures drawn.   UA with positive nitrite, moderate leukocytes, and microscopy showing 21-50 RBCs, 21-50 WBCs, and many bacteria.  Urine culture pending.  Chest x-ray showed cardiomegaly, vascular congestion CTA chest negative for PE and showing mild bibasilar atelectatic changes.   ED physician consulted urology (Dr. Liliane Shi) who recommended starting antibiotics to treat for UTI and will consult in the morning.   Patient was started on IV ceftriaxone.  Subjective: Patient was seen and examined this morning. Propped up in bed.  Was taking his lunch at the time of my evaluation.  Hard of hearing but able to answer questions fluently today.  Had penile bleeding since this morning.  Seen by urology  Assessment and plan: UTI associated with suprapubic catheter Sepsis - POA Neurogenic bladder s/p suprapubic catheter  BPH Reported fever at home Noted to have tachycardia, tachypnea, lethargy, UTI, and malodorous suprapubic catheter insertion site Blood  cultures no growth so far.  Urine culture showed multiple species present Currently on IV Rocephin  Urethral bleeding Started this morning.  Reports no trauma. Per urology note, bleeding is likely from prostatic urethra where he has an area of necrotic tissue post resection.  Patient is also on Xarelto.  A large bore ureteral catheter was placed in order to tamponade the bleeding with outside of intervention. Continue Proscar, Flomax Recent Labs  Lab 01/05/23 1309 01/06/23 0428 01/07/23 1834 01/07/23 1839 01/07/23 2105 01/08/23 0722 01/10/23 0359  WBC 13.4* 13.0* 13.1*  --   --  12.0* 9.1  LATICACIDVEN  --   --   --  0.7 0.7  --   --    Acute metabolic encephalopathy Mental status much better today.  Acute on chronic diastolic CHF Essential hypertension Daughter reports shortness of breath on exertion worsening lately. Chest x-ray on admission showed cardiomegaly and vascular congestion. BNP was elevated to 227 Most recent echo done in December 2023 was showing EF 60 to 65% and diastolic function could not be evaluated.  Giving Lasix 40 mg IV 1 dose on 7/24. PTA meds-Lasix 20 mg daily.  If bleeding stops, plan to resume Lasix tomorrow. Net IO Since Admission: -3,941.44 mL [01/10/23 1722] Continue to monitor for daily intake output, weight, blood pressure, BNP, renal function and electrolytes. Recent Labs  Lab 01/05/23 1309 01/06/23 0428 01/07/23 1834 01/08/23 0800 01/10/23 0359  BNP 81.3  --  226.8*  --   --   BUN 15 11 13   --  16  CREATININE 0.85 0.81 0.75  --  0.77  NA 135 134* 130*  --  135  K 3.4* 3.5 3.4*  --  3.2*  MG 1.8  --   --  2.0  --     Paroxysmal A-fib Continue Cardizem.  Metoprolol as needed Xarelto on hold due to urethral bleeding  Shortness of breath and mild hypoxia Low initial O2 sat likely due to atelectasis as seen in CT chest.  CTA chest negative for PE.    Hyperlipidemia Continue Lipitor   Mild hyponatremia Continue gentle IV fluid  hydration with normal saline Recent Labs  Lab 01/05/23 1309 01/06/23 0428 01/07/23 1834 01/10/23 0359  NA 135 134* 130* 135   Mild hypokalemia Potassium level low this morning at 3.2.  Replacement given. Recent Labs  Lab 01/05/23 1309 01/06/23 0428 01/07/23 1834 01/08/23 0800 01/10/23 0359  K 3.4* 3.5 3.4*  --  3.2*  MG 1.8  --   --  2.0  --    Anxiety/depression Not on meds  Diarrhea Currently on Imodium twice daily.  Check C. difficile assay   Mobility: PT eval  Goals of care   Code Status: Full Code     DVT prophylaxis:  SCDs Start: 01/08/23 0506   Antimicrobials: IV Rocephin Fluid: None currently Consultants: Urology Family Communication: None at bedside  Status: Inpatient Level of care:  Progressive   Patient from: Home Anticipated d/c to: Pending clinical course Needs to continue in-hospital care:  Antibiotics to continue   Diet:  Diet Order             Diet regular Fluid consistency: Thin  Diet effective now                   Scheduled Meds:  diltiazem  180 mg Oral Daily   fiber supplement (BANATROL TF)  60 mL Oral BID   loperamide  2 mg Oral BID   potassium chloride  40 mEq Oral Once    PRN meds: acetaminophen **OR** acetaminophen, metoprolol tartrate, ondansetron (ZOFRAN) IV   Infusions:   cefTRIAXone (ROCEPHIN)  IV 1 g (01/09/23 1756)    Antimicrobials: Anti-infectives (From admission, onward)    Start     Dose/Rate Route Frequency Ordered Stop   01/08/23 1900  cefTRIAXone (ROCEPHIN) 1 g in sodium chloride 0.9 % 100 mL IVPB        1 g 200 mL/hr over 30 Minutes Intravenous Every 24 hours 01/08/23 0507     01/07/23 1945  cefTRIAXone (ROCEPHIN) 1 g in sodium chloride 0.9 % 100 mL IVPB        1 g 200 mL/hr over 30 Minutes Intravenous  Once 01/07/23 1939 01/07/23 2015       Nutritional status:  Body mass index is 34.18 kg/m.          Objective: Vitals:   01/10/23 0501 01/10/23 1306  BP: (!) 142/93 118/73   Pulse: 90 79  Resp: 14   Temp: 98.3 F (36.8 C) 98.2 F (36.8 C)  SpO2: 95% 94%    Intake/Output Summary (Last 24 hours) at 01/10/2023 1408 Last data filed at 01/10/2023 0830 Gross per 24 hour  Intake 240 ml  Output 2700 ml  Net -2460 ml   Filed Weights   01/07/23 2016  Weight: 114.3 kg   Weight change:  Body mass index is 34.18 kg/m.   Physical Exam: General exam: Pleasant, elderly Caucasian male.  Not in distress Skin: No rashes, lesions or ulcers. HEENT: Atraumatic, normocephalic, no obvious bleeding Lungs: Clear  to auscultation bilaterally CVS: Regular rate and rhythm, no murmur GI/Abd: soft, nondistended, bowel sound present, suprapubic catheter site with granulation tissue and crusts.  Urethral bleeding noted. CNS: Alert, awake, hard of hearing but oriented x 3 otherwise  psychiatry: Sad affect Extremities: No pedal edema, no calf tenderness  Data Review: I have personally reviewed the laboratory data and studies available.  F/u labs ordered Unresulted Labs (From admission, onward)     Start     Ordered   01/10/23 0500  CBC with Differential/Platelet  Daily,   R      01/09/23 1314   01/10/23 0500  Basic metabolic panel  Daily,   R      01/09/23 1314   01/09/23 1301  C Difficile Quick Screen w PCR reflex  (C Difficile quick screen w PCR reflex panel )  Once, for 24 hours,   TIMED       References:    CDiff Information Tool   01/09/23 1300            Total time spent in review of labs and imaging, patient evaluation, formulation of plan, documentation and communication with family: 45 minutes  Signed, Lorin Glass, MD [facility] 01/10/2023

## 2023-01-10 NOTE — Progress Notes (Signed)
     Subjective: Urethral bleeding early this AM. Pt resting comfortably with no complaints. Reports no trauma.  Objective: Vital signs in last 24 hours: Temp:  [98 F (36.7 C)-98.6 F (37 C)] 98.3 F (36.8 C) (07/25 0501) Pulse Rate:  [74-93] 90 (07/25 0501) Resp:  [14-22] 14 (07/25 0501) BP: (120-162)/(77-114) 142/93 (07/25 0501) SpO2:  [93 %-96 %] 95 % (07/25 0501)  Intake/Output from previous day: 07/24 0701 - 07/25 0700 In: 240 [P.O.:240] Out: 3400 [Urine:3400]  Intake/Output this shift: Total I/O In: 240 [P.O.:240] Out: -   Physical Exam:  General: Alert and oriented CV: No cyanosis Lungs: equal chest rise Abdomen: Soft, NTND, no rebound or guarding Gu: SPT in place. Clear fluid irrigated from bladder. Frank bleeding from penile urethra.  Lab Results: Recent Labs    01/07/23 1834 01/08/23 0722 01/10/23 0359  HGB 14.0 13.7 13.4  HCT 38.9* 39.3 39.3   BMET Recent Labs    01/07/23 1834 01/10/23 0359  NA 130* 135  K 3.4* 3.2*  CL 98 100  CO2 21* 25  GLUCOSE 144* 116*  BUN 13 16  CREATININE 0.75 0.77  CALCIUM 8.4* 8.6*     Studies/Results: No results found.  Assessment/Plan: #SPT Healing appropriately.  There is a little bit of fibrinous exudate and catheter is frequently pinned between his abdomen and pubis, causing some minor erosion on the lateral ends of the incision.  # Urethral bleeding Called back to see patient again this morning D/T significant amount of clot material expelled from the penile urethra.  It was some blood in the bag but when I hand irrigated the suprapubic tube it came back completely clear.  From his previous operative note there was some necrotic tissue resected from the right lobe of the prostatic urethra.  I witnessed this bleed significantly while he was coughing.  Considering his Xarelto, I am assuming that he had a rebleed which filled the urethra with clot material, eventually backing up somewhat into the bladder.  He  has a known false passage, but I will attempt to place a larger Foley catheter to attempt to tamponade his bleed in the short-term.   LOS: 2 days   Elmon Kirschner, NP [facility] Pager: [phone]  01/10/2023, 10:02 AM

## 2023-01-10 NOTE — Progress Notes (Signed)
PT Cancellation Note  Patient Details Name: Calvin [name] [name]: [id number] DOB: 05/26/1946   Cancelled Treatment:    Reason Eval/Treat Not Completed: Other (comment). RN requesting therapy hold due to bleeding and urology present at time.   Domenick Bookbinder PT, DPT 01/10/23, 9:49 AM

## 2023-01-10 NOTE — Progress Notes (Signed)
Pt noted with bleeding and clot coming from penis and also draining from suprapubic cath. Denies any discomfort or pain. VS stable. NP made aware

## 2023-01-10 NOTE — Procedures (Addendum)
Patient experiencing significant bleeding, likely from the prostatic urethra s/p resection of necrotic tissue, on Xarelto.  Please see separate consult notes.  After discussing the outcome with the patient, shared decision was made to proceed with placement of large bore urethral catheter in an attempt to tamponade bleeding without surgical intervention.  Patient was prepped and draped in usual sterile fashion.  10 cc of sterile lubricant was injected directly into the penile urethra.  This was splayed open deeply from previous trauma.  At this point a 4f Foley catheter was advanced into the bladder.  Once placement was confirmed with manual irrigation, the balloon was inflated to 20 mL with sterile water.  The catheter was plugged at this point and put under gentle tension with the catheter strap.  I discussed plan with nursing who will take tension off q. hourly until bleeding has subsided.    Case and plan discussed with Dr. Arita Miss   Patient was seen and examined by me.  Foley capped and secured to leg. No significant tension. Recommend this stay in place for next few days. Ideally hold xorelto.

## 2023-01-11 DIAGNOSIS — T83511A Infection and inflammatory reaction due to indwelling urethral catheter, initial encounter: Secondary | ICD-10-CM | POA: Diagnosis not present

## 2023-01-11 DIAGNOSIS — N390 Urinary tract infection, site not specified: Secondary | ICD-10-CM | POA: Diagnosis not present

## 2023-01-11 MED ORDER — DM-GUAIFENESIN ER 30-600 MG PO TB12
1.0000 | ORAL_TABLET | Freq: Two times a day (BID) | ORAL | Status: DC
  Administered 2023-01-11 – 2023-01-19 (×17): 1 via ORAL
  Filled 2023-01-11 (×17): qty 1

## 2023-01-11 MED ORDER — BENZONATATE 100 MG PO CAPS
100.0000 mg | ORAL_CAPSULE | Freq: Three times a day (TID) | ORAL | Status: DC
  Administered 2023-01-11 – 2023-01-19 (×23): 100 mg via ORAL
  Filled 2023-01-11 (×23): qty 1

## 2023-01-11 NOTE — Progress Notes (Addendum)
     Subjective: Urethral bleeding improved but persisting.  Objective: Vital signs in last 24 hours: Temp:  [97.6 F (36.4 C)-98.6 F (37 C)] 98.6 F (37 C) (07/26 0402) Pulse Rate:  [79-92] 92 (07/26 0402) Resp:  [16-20] 20 (07/26 0742) BP: (118-156)/(73-104) 156/95 (07/26 0402) SpO2:  [92 %-98 %] 92 % (07/26 0402)  Intake/Output from previous day: 07/25 0701 - 07/26 0700 In: 720 [P.O.:720] Out: 2000 [Urine:2000]  Intake/Output this shift: No intake/output data recorded.  Physical Exam:  General: Alert and oriented CV: No cyanosis Lungs: equal chest rise Abdomen: Soft, NTND, no rebound or guarding Gu: SPT in place. Clear fluid irrigated from bladder. Frank bleeding from penile urethra.  Lab Results: Recent Labs    01/10/23 0359 01/11/23 0357  HGB 13.4 12.6*  HCT 39.3 36.4*   BMET Recent Labs    01/10/23 0359 01/11/23 0357  NA 135 132*  K 3.2* 3.5  CL 100 101  CO2 25 21*  GLUCOSE 116* 121*  BUN 16 17  CREATININE 0.77 0.72  CALCIUM 8.6* 8.5*     Studies/Results: No results found.  Assessment/Plan: #SPT Healing appropriately.  There is a little bit of fibrinous exudate and catheter is frequently pinned between his abdomen and pubis, causing some minor erosion on the lateral ends of the incision.  # Urethral bleeding Necrotic area of prostatic urethra was resected.  It appears that during coughing fit patient has broken this area loose and is not bleeding significantly from the urethra.  His Xarelto has been held for the time being.  80f Foley catheter placed and put under tension.  Will continue to monitor and trend hemoglobin during Xarelto washout.  Failing acceptable improvement will consider cystoscopy with fulguration. Discussed with daughter and considering his dementia, she would like to coordinate with her mother, his ex-wife, to be there at time of surgery.   Case and plan discussed with Dr. Arita Miss.    LOS: 3 days   Elmon Kirschner,  NP [facility] Pager: [phone]  01/11/2023, 10:38 AM

## 2023-01-11 NOTE — Progress Notes (Signed)
PT Cancellation Note  Patient Details Name: Calvin [name] [name]: [id number] DOB: Mar 27, 1946   Cancelled Treatment:    Reason Eval/Treat Not Completed: Patient not medically ready. Per RN, pt with traction to stop bleeding, urology requesting therapy hold today.    Domenick Bookbinder PT, DPT 01/11/23, 11:52 AM

## 2023-01-11 NOTE — Plan of Care (Signed)
  Problem: Education: Goal: Knowledge of General Education information will improve Description Including pain rating scale, medication(s)/side effects and non-pharmacologic comfort measures Outcome: Progressing   Problem: Clinical Measurements: Goal: Ability to maintain clinical measurements within normal limits will improve Outcome: Progressing Goal: Respiratory complications will improve Outcome: Progressing Goal: Cardiovascular complication will be avoided Outcome: Progressing   Problem: Nutrition: Goal: Adequate nutrition will be maintained Outcome: Progressing   Problem: Coping: Goal: Level of anxiety will decrease Outcome: Progressing   Problem: Elimination: Goal: Will not experience complications related to bowel motility Outcome: Progressing Goal: Will not experience complications related to urinary retention Outcome: Progressing   Problem: Pain Managment: Goal: General experience of comfort will improve Outcome: Progressing   Problem: Safety: Goal: Ability to remain free from injury will improve Outcome: Progressing

## 2023-01-11 NOTE — Progress Notes (Signed)
PROGRESS NOTE  Calvin [name]  DOB: 1946/04/21  PCP: [name], MD RUE:[id number]  DOA: 01/07/2023  LOS: 3 days  Hospital Day: 5  Brief narrative: Calvin [name] is a 77 y.o. male with PMH significant for HTN, HLD, ischemic stroke 12/23, BPH, neurogenic bladder, suprapubic catheter in place, anxiety/depression. Recently admitted 7/20-7/21 due to obstruction of his suprapubic catheter for which he was evaluated by urology and found to have encrusted suprapubic tube which was exchanged.  He was also found to have new onset A-fib with RVR and was discharged on oral Cardizem and Xarelto. Next day, 7/22, patient was brought to the ED with a fever 100.9, lethargy and redness and malodor noted at the site of suprapubic catheter insertion.  In the ED, he was afebrile but tachycardic, tachypneic, he was noted to have a low O2 sat of 89% on room air, placed on 2 L oxygen nasal cannula. Labs showed WBC 13.1, sodium 130, lactate normal x 2, blood cultures drawn.   UA with positive nitrite, moderate leukocytes, and microscopy showing 21-50 RBCs, 21-50 WBCs, and many bacteria.  Urine culture pending.  Chest x-ray showed cardiomegaly, vascular congestion CTA chest negative for PE and showing mild bibasilar atelectatic changes.   ED physician consulted urology (Dr. Liliane Shi) who recommended starting antibiotics to treat for UTI and will consult in the morning.   Patient was started on IV ceftriaxone.  Subjective: Patient was seen and examined this morning. Propped up in bed.  Not in distress.  Continues to have urethral bleeding.  Having bouts of cough probably causing squirts of bleeding   Assessment and plan: UTI associated with suprapubic catheter Sepsis - POA Neurogenic bladder s/p suprapubic catheter  BPH Reported fever at home Noted to have tachycardia, tachypnea, lethargy, UTI, and malodorous suprapubic catheter insertion site Blood cultures no growth so far.  Urine culture showed  multiple species present Currently on IV Rocephin  Urethral bleeding Started on 7/25.  No trauma. Per urology note, bleeding is likely from prostatic urethra where he has an area of necrotic tissue post resection.  A large bore ureteral catheter was placed in order to tamponade the bleeding with outside of intervention. Xarelto was stopped. Continue Proscar, Flomax Recent Labs  Lab 01/06/23 0428 01/07/23 1834 01/07/23 1839 01/07/23 2105 01/08/23 0722 01/10/23 0359 01/11/23 0357  WBC 13.0* 13.1*  --   --  12.0* 9.1 11.1*  LATICACIDVEN  --   --  0.7 0.7  --   --   --    Acute metabolic encephalopathy Mental status much better today.  Acute on chronic diastolic CHF Essential hypertension Daughter reports shortness of breath on exertion worsening lately. Chest x-ray on admission showed cardiomegaly and vascular congestion. BNP was elevated to 227 Most recent echo done in December 2023 was showing EF 60 to 65% and diastolic function could not be evaluated.  Given Lasix 40 mg IV 1 dose on 7/24. PTA meds-Lasix 20 mg daily.   Net IO Since Admission: -5,011.44 mL [01/11/23 1234] Continue to monitor for daily intake output, weight, blood pressure, BNP, renal function and electrolytes. Recent Labs  Lab 01/05/23 1309 01/06/23 0428 01/07/23 1834 01/08/23 0800 01/10/23 0359 01/11/23 0357  BNP 81.3  --  226.8*  --   --   --   BUN 15 11 13   --  16 17  CREATININE 0.85 0.81 0.75  --  0.77 0.72  NA 135 134* 130*  --  135 132*  K 3.4* 3.5 3.4*  --  3.2* 3.5  MG 1.8  --   --  2.0  --   --    Paroxysmal A-fib Continue Cardizem.  Metoprolol as needed Xarelto on hold due to urethral bleeding  Shortness of breath and mild hypoxia Low initial O2 sat likely due to atelectasis as seen in CT chest.  CTA chest negative for PE.   Will start the patient on Mucinex DM for cough  Hyperlipidemia Continue Lipitor   Mild hyponatremia Continue gentle IV fluid hydration with normal  saline Recent Labs  Lab 01/05/23 1309 01/06/23 0428 01/07/23 1834 01/10/23 0359 01/11/23 0357  NA 135 134* 130* 135 132*   Mild hypokalemia Potassium level low this morning at 3.2.  Replacement given. Recent Labs  Lab 01/05/23 1309 01/06/23 0428 01/07/23 1834 01/08/23 0800 01/10/23 0359 01/11/23 0357  K 3.4* 3.5 3.4*  --  3.2* 3.5  MG 1.8  --   --  2.0  --   --    Anxiety/depression Not on meds  Diarrhea/constipation Diarrhea improved after he was started on Banatrol and Imodium as needed.  No BM in 2 days.  Stop Imodium.  Okay to continue Benadryl.   Mobility: PT eval  Goals of care   Code Status: Full Code     DVT prophylaxis:  SCDs Start: 01/08/23 0506   Antimicrobials: IV Rocephin Fluid: None currently Consultants: Urology Family Communication: None at bedside  Status: Inpatient Level of care:  Progressive   Patient from: Home Anticipated d/c to: Pending clinical course Needs to continue in-hospital care:  Antibiotics to continue   Diet:  Diet Order             Diet NPO time specified  Diet effective midnight           Diet regular Fluid consistency: Thin  Diet effective now                   Scheduled Meds:  dextromethorphan-guaiFENesin  1 tablet Oral BID   diltiazem  180 mg Oral Daily   fiber supplement (BANATROL TF)  60 mL Oral BID   loperamide  2 mg Oral BID    PRN meds: acetaminophen **OR** acetaminophen, metoprolol tartrate, ondansetron (ZOFRAN) IV   Infusions:   cefTRIAXone (ROCEPHIN)  IV 1 g (01/10/23 1841)    Antimicrobials: Anti-infectives (From admission, onward)    Start     Dose/Rate Route Frequency Ordered Stop   01/08/23 1900  cefTRIAXone (ROCEPHIN) 1 g in sodium chloride 0.9 % 100 mL IVPB        1 g 200 mL/hr over 30 Minutes Intravenous Every 24 hours 01/08/23 0507     01/07/23 1945  cefTRIAXone (ROCEPHIN) 1 g in sodium chloride 0.9 % 100 mL IVPB        1 g 200 mL/hr over 30 Minutes Intravenous  Once  01/07/23 1939 01/07/23 2015       Nutritional status:  Body mass index is 34.18 kg/m.          Objective: Vitals:   01/11/23 0402 01/11/23 0742  BP: (!) 156/95   Pulse: 92   Resp: 18 20  Temp: 98.6 F (37 C)   SpO2: 92%     Intake/Output Summary (Last 24 hours) at 01/11/2023 1234 Last data filed at 01/11/2023 0444 Gross per 24 hour  Intake 480 ml  Output 2000 ml  Net -1520 ml   Filed Weights   01/07/23 2016  Weight: 114.3 kg   Weight change:  Body  mass index is 34.18 kg/m.   Physical Exam: General exam: Pleasant, elderly Caucasian male.  Not in distress Skin: No rashes, lesions or ulcers. HEENT: Atraumatic, normocephalic, no obvious bleeding Lungs: Clear to auscultation bilaterally CVS: Regular rate and rhythm, no murmur GI/Abd: soft, nondistended, bowel sound present, suprapubic catheter site with granulation tissue and crusts.  Urethral bleeding noted. CNS: Alert, awake, hard of hearing but oriented x 3 otherwise  psychiatry: Sad affect Extremities: No pedal edema, no calf tenderness  Data Review: I have personally reviewed the laboratory data and studies available.  F/u labs ordered Unresulted Labs (From admission, onward)     Start     Ordered   01/10/23 0500  CBC with Differential/Platelet  Daily,   R      01/09/23 1314   01/10/23 0500  Basic metabolic panel  Daily,   R      01/09/23 1314            Total time spent in review of labs and imaging, patient evaluation, formulation of plan, documentation and communication with family: 45 minutes  Signed, Lorin Glass, MD [facility] 01/11/2023

## 2023-01-12 DIAGNOSIS — N390 Urinary tract infection, site not specified: Secondary | ICD-10-CM | POA: Diagnosis not present

## 2023-01-12 DIAGNOSIS — T83511A Infection and inflammatory reaction due to indwelling urethral catheter, initial encounter: Secondary | ICD-10-CM | POA: Diagnosis not present

## 2023-01-12 MED ORDER — FINASTERIDE 5 MG PO TABS
5.0000 mg | ORAL_TABLET | Freq: Every day | ORAL | Status: DC
  Administered 2023-01-12 – 2023-01-19 (×8): 5 mg via ORAL
  Filled 2023-01-12 (×8): qty 1

## 2023-01-12 NOTE — Progress Notes (Signed)
PROGRESS NOTE  Calvin [name]  DOB: 1946-03-23  PCP: [name], MD ZHY:[id number]  DOA: 01/07/2023  LOS: 4 days  Hospital Day: 6  Brief narrative: Calvin [name] is a 77 y.o. male with PMH significant for HTN, HLD, ischemic stroke 12/23, BPH, neurogenic bladder, suprapubic catheter in place, anxiety/depression. Recently admitted 7/20-7/21 due to obstruction of his suprapubic catheter for which he was evaluated by urology and found to have encrusted suprapubic tube which was exchanged.  He was also found to have new onset A-fib with RVR and was discharged on oral Cardizem and Xarelto. Next day, 7/22, patient was brought to the ED with a fever 100.9, lethargy and redness and malodor noted at the site of suprapubic catheter insertion.  In the ED, he was afebrile but tachycardic, tachypneic, he was noted to have a low O2 sat of 89% on room air, placed on 2 L oxygen nasal cannula. Labs showed WBC 13.1, sodium 130, lactate normal x 2, blood cultures drawn.   UA with positive nitrite, moderate leukocytes, and microscopy showing 21-50 RBCs, 21-50 WBCs, and many bacteria.  Urine culture pending.  Chest x-ray showed cardiomegaly, vascular congestion CTA chest negative for PE and showing mild bibasilar atelectatic changes.   ED physician consulted urology (Dr. Liliane Shi) who recommended starting antibiotics to treat for UTI and will consult in the morning.   Patient was started on IV ceftriaxone.  Subjective: Patient was seen and examined this morning. Propped up in bed.  Not in distress.  Continues to have mild bleeding from penile meatus.  Assessment and plan: UTI associated with suprapubic catheter Sepsis - POA Neurogenic bladder s/p suprapubic catheter  BPH Reported fever at home Noted to have tachycardia, tachypnea, lethargy, UTI, and malodorous suprapubic catheter insertion site Blood cultures no growth so far.  Urine culture showed multiple species present Currently on IV Rocephin.   I will continue with while having urethral bleeding.  Urethral bleeding Started on 7/25.  No trauma. Per urology note, bleeding is likely from prostatic urethra where he has an area of necrotic tissue post resection.  A large bore ureteral catheter was placed in order to tamponade the bleeding with outside of intervention. Xarelto was being stopped. Continue Proscar, Flomax Urology following.  Recent Labs  Lab 01/07/23 1834 01/07/23 1839 01/07/23 2105 01/08/23 0722 01/10/23 0359 01/11/23 0357 01/12/23 0506  WBC 13.1*  --   --  12.0* 9.1 11.1* 11.8*  LATICACIDVEN  --  0.7 0.7  --   --   --   --    Acute on chronic diastolic CHF Essential hypertension Daughter reports shortness of breath on exertion worsening lately. Chest x-ray on admission showed cardiomegaly and vascular congestion. BNP was elevated to 227 Most recent echo done in December 2023 was showing EF 60 to 65% and diastolic function could not be evaluated.  Given Lasix 40 mg IV 1 dose on 7/24. PTA meds-Lasix 20 mg daily.  Currently on hold.  To resume once bleeding stops. Net IO Since Admission: -7,091.44 mL [01/12/23 1426] Continue to monitor for daily intake output, weight, blood pressure, BNP, renal function and electrolytes. Recent Labs  Lab 01/06/23 0428 01/07/23 1834 01/08/23 0800 01/10/23 0359 01/11/23 0357 01/12/23 0506  BNP  --  226.8*  --   --   --   --   BUN 11 13  --  16 17 15   CREATININE 0.81 0.75  --  0.77 0.72 0.76  NA 134* 130*  --  135 132*  135  K 3.5 3.4*  --  3.2* 3.5 4.1  MG  --   --  2.0  --   --   --    Paroxysmal A-fib Continue Cardizem.  Metoprolol as needed Xarelto on hold due to urethral bleeding  Shortness of breath and mild hypoxia Low initial O2 sat likely due to atelectasis as seen in CT chest.  CTA chest negative for PE.   Will start the patient on Mucinex DM for cough  Hyperlipidemia Continue Lipitor   Mild hyponatremia Continue gentle IV fluid hydration with normal  saline Recent Labs  Lab 01/06/23 0428 01/07/23 1834 01/10/23 0359 01/11/23 0357 01/12/23 0506  NA 134* 130* 135 132* 135   Mild hypokalemia Potassium level low this morning at 3.2.  Replacement given. Recent Labs  Lab 01/06/23 0428 01/07/23 1834 01/08/23 0800 01/10/23 0359 01/11/23 0357 01/12/23 0506  K 3.5 3.4*  --  3.2* 3.5 4.1  MG  --   --  2.0  --   --   --    Anxiety/depression Not on meds  Diarrhea/constipation Diarrhea improved after he was started on Banatrol and Imodium as needed.  No BM in 3 days.  Stop Imodium.  Okay to continue Benadryl.   Mobility: PT eval  Goals of care   Code Status: Full Code     DVT prophylaxis:  SCDs Start: 01/08/23 0506   Antimicrobials: IV Rocephin Fluid: None currently Consultants: Urology Family Communication: None at bedside  Status: Inpatient Level of care:  Progressive   Patient from: Home Anticipated d/c to: Pending clinical course Needs to continue in-hospital care:  Antibiotics to continue   Diet:  Diet Order             Diet regular Room service appropriate? Yes; Fluid consistency: Thin  Diet effective now                   Scheduled Meds:  benzonatate  100 mg Oral TID   dextromethorphan-guaiFENesin  1 tablet Oral BID   diltiazem  180 mg Oral Daily   fiber supplement (BANATROL TF)  60 mL Oral BID   finasteride  5 mg Oral Daily    PRN meds: acetaminophen **OR** acetaminophen, metoprolol tartrate, ondansetron (ZOFRAN) IV   Infusions:   cefTRIAXone (ROCEPHIN)  IV Stopped (01/11/23 1855)    Antimicrobials: Anti-infectives (From admission, onward)    Start     Dose/Rate Route Frequency Ordered Stop   01/08/23 1900  cefTRIAXone (ROCEPHIN) 1 g in sodium chloride 0.9 % 100 mL IVPB        1 g 200 mL/hr over 30 Minutes Intravenous Every 24 hours 01/08/23 0507     01/07/23 1945  cefTRIAXone (ROCEPHIN) 1 g in sodium chloride 0.9 % 100 mL IVPB        1 g 200 mL/hr over 30 Minutes Intravenous   Once 01/07/23 1939 01/07/23 2015       Nutritional status:  Body mass index is 34.18 kg/m.          Objective: Vitals:   01/12/23 1044 01/12/23 1313  BP: (!) 150/100 (!) 140/75  Pulse:  81  Resp:  20  Temp:  98 F (36.7 C)  SpO2:  95%    Intake/Output Summary (Last 24 hours) at 01/12/2023 1426 Last data filed at 01/12/2023 0518 Gross per 24 hour  Intake --  Output 1800 ml  Net -1800 ml   Filed Weights   01/07/23 2016  Weight: 114.3 kg  Weight change:  Body mass index is 34.18 kg/m.   Physical Exam: General exam: Pleasant, elderly Caucasian male.  Not in distress Skin: No rashes, lesions or ulcers. HEENT: Atraumatic, normocephalic, no obvious bleeding Lungs: Clear to auscultation bilaterally CVS: Regular rate and rhythm, no murmur GI/Abd: soft, nondistended, bowel sound present, suprapubic catheter site with granulation tissue and crusts.  Urethral bleeding continues.  Urology following. CNS: Alert, awake, hard of hearing but oriented x 3 otherwise  psychiatry: Sad affect Extremities: No pedal edema, no calf tenderness  Data Review: I have personally reviewed the laboratory data and studies available.  F/u labs ordered Unresulted Labs (From admission, onward)    None       Total time spent in review of labs and imaging, patient evaluation, formulation of plan, documentation and communication with family: 45 minutes  Signed, Lorin Glass, MD [facility] 01/12/2023

## 2023-01-12 NOTE — Plan of Care (Signed)

## 2023-01-12 NOTE — Progress Notes (Signed)
Subjective: Patient reports decreased bleeding per urethra. Urine clear from SP tube  Objective: Vital signs in last 24 hours: Temp:  [97.5 F (36.4 C)-98 F (36.7 C)] 98 F (36.7 C) (07/27 1313) Pulse Rate:  [81-82] 81 (07/27 1313) Resp:  [18-20] 20 (07/27 1313) BP: (140-150)/(75-100) 140/75 (07/27 1313) SpO2:  [94 %-96 %] 95 % (07/27 1313)  Intake/Output from previous day: 07/26 0701 - 07/27 0700 In: 120 [P.O.:120] Out: 2200 [Urine:2200] Intake/Output this shift: No intake/output data recorded.  Physical Exam:  General:alert, cooperative, and appears stated age GI: soft, non tender, normal bowel sounds, no palpable masses, no organomegaly, no inguinal hernia Male genitalia: no testicular masses Penis: uncircumcised and scant bloody discharge Urethral Meatus: normal Extremities: extremities normal, atraumatic, no cyanosis or edema  Lab Results: Recent Labs    01/10/23 0359 01/11/23 0357 01/12/23 0506  HGB 13.4 12.6* 12.3*  HCT 39.3 36.4* 36.5*   BMET Recent Labs    01/11/23 0357 01/12/23 0506  NA 132* 135  K 3.5 4.1  CL 101 101  CO2 21* 23  GLUCOSE 121* 116*  BUN 17 15  CREATININE 0.72 0.76  CALCIUM 8.5* 8.8*   No results for input(s): "LABPT", "INR" in the last 72 hours. No results for input(s): "LABURIN" in the last 72 hours. Results for orders placed or performed during the hospital encounter of 01/07/23  Culture, blood (routine x 2)     Status: None   Collection Time: 01/07/23  6:39 PM   Specimen: BLOOD  Result Value Ref Range Status   Specimen Description   Final    BLOOD BLOOD RIGHT WRIST Performed at [facility], [address], [place], [place] [address]    Special Requests   Final    BOTTLES DRAWN AEROBIC AND ANAEROBIC Blood Culture results may not be optimal due to an excessive volume of blood received in culture bottles Performed at [facility], [address] [place], [place] [address]     Culture   Final    NO GROWTH 5 DAYS Performed at [facility], [address] [place], [place] [address]    Report Status 01/12/2023 FINAL  Final  Culture, blood (routine x 2)     Status: None   Collection Time: 01/07/23  6:44 PM   Specimen: BLOOD  Result Value Ref Range Status   Specimen Description   Final    BLOOD BLOOD RIGHT HAND Performed at [facility], [address] [place], [place] [address]    Special Requests   Final    BOTTLES DRAWN AEROBIC AND ANAEROBIC Blood Culture adequate volume Performed at [facility], [address], [place], [place] [address]    Culture   Final    NO GROWTH 5 DAYS Performed at [facility], [address], [place], [place] [address]    Report Status 01/12/2023 FINAL  Final  Urine Culture     Status: Abnormal   Collection Time: 01/07/23 10:32 PM   Specimen: Urine, Catheterized  Result Value Ref Range Status   Specimen Description   Final    URINE, CATHETERIZED Performed at [facility], [address], [place], [place] [address]    Special Requests   Final    NONE Performed at [facility], [address], [place], [place] [address]    Culture MULTIPLE SPECIES PRESENT, SUGGEST RECOLLECTION (A)  Final   Report Status 01/09/2023 FINAL  Final  Studies/Results: No results found.  Assessment/Plan: 77yo with prostatic bleeding Please continue to hold blood thinners Finasteride 5mg  restarted   LOS: 4 days   Wilkie Aye 01/12/2023, 1:58 PM

## 2023-01-12 NOTE — Plan of Care (Signed)
  Problem: Pain Managment: Goal: General experience of comfort will improve Outcome: Progressing   Problem: Coping: Goal: Level of anxiety will decrease Outcome: Progressing   

## 2023-01-13 DIAGNOSIS — N390 Urinary tract infection, site not specified: Secondary | ICD-10-CM | POA: Diagnosis not present

## 2023-01-13 DIAGNOSIS — T83511A Infection and inflammatory reaction due to indwelling urethral catheter, initial encounter: Secondary | ICD-10-CM | POA: Diagnosis not present

## 2023-01-13 LAB — CBC WITH DIFFERENTIAL/PLATELET
Abs Immature Granulocytes: 0.1 10*3/uL — ABNORMAL HIGH (ref 0.00–0.07)
Basophils Absolute: 0.1 10*3/uL (ref 0.0–0.1)
Basophils Relative: 1 %
Eosinophils Absolute: 0.5 10*3/uL (ref 0.0–0.5)
Eosinophils Relative: 4 %
HCT: 34.9 % — ABNORMAL LOW (ref 39.0–52.0)
Hemoglobin: 11.6 g/dL — ABNORMAL LOW (ref 13.0–17.0)
Immature Granulocytes: 1 %
Lymphocytes Relative: 16 %
Lymphs Abs: 2.3 10*3/uL (ref 0.7–4.0)
MCH: 33 pg (ref 26.0–34.0)
MCHC: 33.2 g/dL (ref 30.0–36.0)
MCV: 99.4 fL (ref 80.0–100.0)
Monocytes Absolute: 0.9 10*3/uL (ref 0.1–1.0)
Monocytes Relative: 6 %
Neutro Abs: 10.9 10*3/uL — ABNORMAL HIGH (ref 1.7–7.7)
Neutrophils Relative %: 72 %
Platelets: 342 10*3/uL (ref 150–400)
RBC: 3.51 MIL/uL — ABNORMAL LOW (ref 4.22–5.81)
RDW: 11.9 % (ref 11.5–15.5)
WBC: 14.8 10*3/uL — ABNORMAL HIGH (ref 4.0–10.5)
nRBC: 0 % (ref 0.0–0.2)

## 2023-01-13 LAB — BASIC METABOLIC PANEL WITH GFR
Anion gap: 12 (ref 5–15)
BUN: 15 mg/dL (ref 8–23)
CO2: 18 mmol/L — ABNORMAL LOW (ref 22–32)
Calcium: 8.4 mg/dL — ABNORMAL LOW (ref 8.9–10.3)
Chloride: 100 mmol/L (ref 98–111)
Creatinine, Ser: 0.71 mg/dL (ref 0.61–1.24)
GFR, Estimated: >60 mL/min (ref >60)
Glucose, Bld: 110 mg/dL — ABNORMAL HIGH (ref 70–99)
Potassium: 3.9 mmol/L (ref 3.5–5.1)
Sodium: 130 mmol/L — ABNORMAL LOW (ref 135–145)

## 2023-01-13 MED ORDER — LEVALBUTEROL HCL 0.63 MG/3ML IN NEBU
0.6300 mg | INHALATION_SOLUTION | Freq: Four times a day (QID) | RESPIRATORY_TRACT | Status: DC | PRN
  Administered 2023-01-13: 0.63 mg via RESPIRATORY_TRACT
  Filled 2023-01-13: qty 3

## 2023-01-13 NOTE — Evaluation (Signed)
Physical Therapy Evaluation Patient Details Name: Calvin [name] [name]: [id number] DOB: 10-31-1945 Today's Date: 01/13/2023  History of Present Illness  77 y.o. male admitted 01/07/23 with sepsis, UTI associated with suprapubic catheter, urethral bleeding. PMH significant for HTN, HLD, ischemic stroke 12/23, BPH, neurogenic bladder, suprapubic catheter in place, anxiety/depression.  Clinical Impression  Pt admitted with above diagnosis.  Pt currently with functional limitations due to the deficits listed below (see PT Problem List). Pt will benefit from acute skilled PT to increase their independence and safety with mobility to allow discharge.  Pt assisted with ambulating short distance in hallway.  Pt reports he is in the process of selling his house.  He also states he has "people" checking on him daily at home but vague.  Pt pleasant and agreeable to mobilize.  Pt states has been to SNF for rehab earlier this year and declines to return.  He plans to d/c home.         Assistance Recommended at Discharge Intermittent Supervision/Assistance  If plan is discharge home, recommend the following:  Can travel by private vehicle  A little help with walking and/or transfers;A little help with bathing/dressing/bathroom;Help with stairs or ramp for entrance;Assistance with cooking/housework        Equipment Recommendations None recommended by PT  Recommendations for Other Services       Functional Status Assessment Patient has had a recent decline in their functional status and demonstrates the ability to make significant improvements in function in a reasonable and predictable amount of time.     Precautions / Restrictions Precautions Precautions: Fall Precaution Comments: urethral bleeding/clotting      Mobility  Bed Mobility Overal bed mobility: Needs Assistance Bed Mobility: Supine to Sit, Rolling Rolling: Supervision   Supine to sit: Min assist     General bed mobility  comments: assist for trunk upright    Transfers Overall transfer level: Needs assistance Equipment used: Rolling walker (2 wheels) Transfers: Sit to/from Stand Sit to Stand: Min assist, From elevated surface           General transfer comment: cues for positioning, elevated bed for assist with rise    Ambulation/Gait Ambulation/Gait assistance: Min guard, Min assist Gait Distance (Feet): 60 Feet Assistive device: Rolling walker (2 wheels) Gait Pattern/deviations: Trunk flexed, Wide base of support, Shuffle, Step-through pattern, Decreased stride length Gait velocity: decr     General Gait Details: cues for positioning in RW, posture; max cues for "big steps", distance to tolerance, assist at times to maneuver RW  Stairs            Wheelchair Mobility     Tilt Bed    Modified Rankin (Stroke Patients Only)       Balance Overall balance assessment: Needs assistance Sitting-balance support: No upper extremity supported, Feet supported Sitting balance-Leahy Scale: Fair     Standing balance support: Bilateral upper extremity supported, During functional activity, Reliant on assistive device for balance Standing balance-Leahy Scale: Poor                               Pertinent Vitals/Pain Pain Assessment Pain Assessment: No/denies pain    Home Living Family/patient expects to be discharged to:: Private residence     Type of Home: House Home Access: Stairs to enter Entrance Stairs-Rails: Right Entrance Stairs-Number of Steps: 3, but spread out   Home Layout: One level;Laundry or work area in [employer] Equipment: [employer] -  single point;Rolling Walker (2 wheels);Wheelchair - manual Additional Comments: vaguely reports he is in process of selling home and moving, plans to return to home upon d/c, also states "people" rotate to check in with him daily    Prior Function Prior Level of Function : Needs assist;Patient poor historian/Family not  available             Mobility Comments: using walker and w/c, states he still drives       Hand Dominance        Extremity/Trunk Assessment        Lower Extremity Assessment Lower Extremity Assessment: Generalized weakness       Communication   Communication: HOH  Cognition Arousal/Alertness: Awake/alert Behavior During Therapy: Flat affect Overall Cognitive Status: No family/caregiver present to determine baseline cognitive functioning                                 General Comments: able to follow simple commands, appropriate during session however poor historian        General Comments      Exercises     Assessment/Plan    PT Assessment Patient needs continued PT services  PT Problem List Decreased mobility;Decreased balance;Decreased activity tolerance;Decreased strength;Decreased knowledge of use of DME       PT Treatment Interventions DME instruction;Gait training;Balance training;Therapeutic exercise;Functional mobility training;Therapeutic activities;Patient/family education    PT Goals (Current goals can be found in the Care Plan section)  Acute Rehab PT Goals PT Goal Formulation: With patient Time For Goal Achievement: 01/27/23 Potential to Achieve Goals: Good    Frequency Min 1X/week     Co-evaluation               AM-PAC PT "6 Clicks" Mobility  Outcome Measure Help needed turning from your back to your side while in a flat bed without using bedrails?: A Little Help needed moving from lying on your back to sitting on the side of a flat bed without using bedrails?: A Little Help needed moving to and from a bed to a chair (including a wheelchair)?: A Lot Help needed standing up from a chair using your arms (e.g., wheelchair or bedside chair)?: A Lot Help needed to walk in hospital room?: A Little Help needed climbing 3-5 steps with a railing? : A Lot 6 Click Score: 15    End of Session Equipment Utilized During  Treatment: Gait belt Activity Tolerance: Patient tolerated treatment well Patient left: in bed;with call bell/phone within reach;with bed alarm set (no recliner in pt's room (floor full) Diplomatic Services operational officer and RN aware) Nurse Communication: Mobility status PT Visit Diagnosis: Difficulty in walking, not elsewhere classified (R26.2);Muscle weakness (generalized) (M62.81)    Time: 1308-6578 PT Time Calculation (min) (ACUTE ONLY): 38 min Time spent assisting RN with hygiene due to urethral clots and BM in bed on arrival (in order to be able to mobilize)   Charges:   PT Evaluation $PT Eval Low Complexity: 1 Low PT Treatments $Gait Training: 8-22 mins PT General Charges $$ ACUTE PT VISIT: 1 Visit        Paulino Door, DPT Physical Therapist Acute Rehabilitation Services Office: [phone]   Janan Halter P[place] 01/13/2023, 12:55 PM

## 2023-01-13 NOTE — Progress Notes (Signed)
PROGRESS NOTE  Calvin [name]  DOB: 03/25/1946  PCP: [name], MD LKG:[id number]  DOA: 01/07/2023  LOS: 5 days  Hospital Day: 7  Brief narrative: Calvin [name] is a 77 y.o. male with PMH significant for HTN, HLD, ischemic stroke 12/23, BPH, neurogenic bladder, suprapubic catheter in place, anxiety/depression. Recently admitted 7/20-7/21 due to obstruction of his suprapubic catheter for which he was evaluated by urology and found to have encrusted suprapubic tube which was exchanged.  He was also found to have new onset A-fib with RVR and was discharged on oral Cardizem and Xarelto. Next day, 7/22, patient was brought to the ED with a fever 100.9, lethargy and redness and malodor noted at the site of suprapubic catheter insertion.  In the ED, he was afebrile but tachycardic, tachypneic, he was noted to have a low O2 sat of 89% on room air, placed on 2 L oxygen nasal cannula. Labs showed WBC 13.1, sodium 130, lactate normal x 2, blood cultures drawn.   UA with positive nitrite, moderate leukocytes, and microscopy showing 21-50 RBCs, 21-50 WBCs, and many bacteria.  Urine culture pending.  Chest x-ray showed cardiomegaly, vascular congestion CTA chest negative for PE and showing mild bibasilar atelectatic changes.   ED physician consulted urology (Dr. Liliane Shi) who recommended starting antibiotics to treat for UTI and will consult in the morning.   Patient was started on IV ceftriaxone.  Subjective: Patient was seen and examined this morning. Propped up in bed.  Not in distress.  Later using walking on the hallway with PT  Continues to have urethral bleeding. Cough improving  Assessment and plan: UTI associated with suprapubic catheter Sepsis - POA Neurogenic bladder s/p suprapubic catheter  BPH Reported fever at home Noted to have tachycardia, tachypnea, lethargy, UTI, and malodorous suprapubic catheter insertion site Blood cultures no growth so far.  Urine culture showed  multiple species present Currently on IV Rocephin.  I will continue with while having urethral bleeding.  Urethral bleeding Started on 7/25.  No trauma. Per urology note, bleeding is likely from prostatic urethra where he has an area of necrotic tissue post resection.  A large bore ureteral catheter was placed in order to tamponade the bleeding with outside of intervention. Xarelto has been stopped for last few days. Urology requested IR for prostate embolization. Continue Proscar, Flomax Recent Labs  Lab 01/07/23 1839 01/07/23 2105 01/08/23 0722 01/10/23 0359 01/11/23 0357 01/12/23 0506 01/13/23 0429  WBC  --   --  12.0* 9.1 11.1* 11.8* 14.8*  LATICACIDVEN 0.7 0.7  --   --   --   --   --    Acute on chronic diastolic CHF Essential hypertension Daughter reports shortness of breath on exertion worsening lately. Chest x-ray on admission showed cardiomegaly and vascular congestion. BNP was elevated to 227 Most recent echo done in December 2023 was showing EF 60 to 65% and diastolic function could not be evaluated.  Given Lasix 40 mg IV 1 dose on 7/24. PTA meds-Lasix 20 mg daily.  Currently on hold.  To resume once bleeding stops. Net IO Since Admission: -8,741.42 mL [01/13/23 1543] Continue to monitor for daily intake output, weight, blood pressure, BNP, renal function and electrolytes. Recent Labs  Lab 01/07/23 1834 01/08/23 0800 01/10/23 0359 01/11/23 0357 01/12/23 0506 01/13/23 0429  BNP 226.8*  --   --   --   --   --   BUN 13  --  16 17 15 15   CREATININE 0.75  --  0.77 0.72 0.76 0.71  NA 130*  --  135 132* 135 130*  K 3.4*  --  3.2* 3.5 4.1 3.9  MG  --  2.0  --   --   --   --    Paroxysmal A-fib Continue Cardizem.  Metoprolol as needed Xarelto on hold due to urethral bleeding  Shortness of breath and mild hypoxia Low initial O2 sat likely due to atelectasis as seen in CT chest.  CTA chest negative for PE.   Cough seems to be improving with Mucinex  DM.  Hyperlipidemia Continue Lipitor   Mild hyponatremia Continue gentle IV fluid hydration with normal saline Recent Labs  Lab 01/07/23 1834 01/10/23 0359 01/11/23 0357 01/12/23 0506 01/13/23 0429  NA 130* 135 132* 135 130*   Mild hypokalemia Potassium level low this morning at 3.2.  Replacement given. Recent Labs  Lab 01/07/23 1834 01/08/23 0800 01/10/23 0359 01/11/23 0357 01/12/23 0506 01/13/23 0429  K 3.4*  --  3.2* 3.5 4.1 3.9  MG  --  2.0  --   --   --   --    Anxiety/depression Not on meds  Diarrhea/constipation Diarrhea improved after he was started on Banatrol and Imodium as needed.  No BM in 3 days after that.  Imodium has been stopped.  Okay to continue Banatrol   Mobility: PT eval  Goals of care   Code Status: Full Code     DVT prophylaxis:  SCDs Start: 01/08/23 0506   Antimicrobials: IV Rocephin Fluid: None currently Consultants: Urology Family Communication: None at bedside  Status: Inpatient Level of care:  Progressive   Patient from: Home Anticipated d/c to: Pending clinical course Needs to continue in-hospital care:  Continue plan to do embolization of prostate in the next 1 to 2 days   Diet:  Diet Order             Diet NPO time specified Except for: Sips with Meds  Diet effective midnight           Diet regular Room service appropriate? Yes; Fluid consistency: Thin  Diet effective now                   Scheduled Meds:  benzonatate  100 mg Oral TID   dextromethorphan-guaiFENesin  1 tablet Oral BID   diltiazem  180 mg Oral Daily   fiber supplement (BANATROL TF)  60 mL Oral BID   finasteride  5 mg Oral Daily    PRN meds: acetaminophen **OR** acetaminophen, metoprolol tartrate, ondansetron (ZOFRAN) IV   Infusions:   cefTRIAXone (ROCEPHIN)  IV Stopped (01/12/23 2116)    Antimicrobials: Anti-infectives (From admission, onward)    Start     Dose/Rate Route Frequency Ordered Stop   01/08/23 1900  cefTRIAXone  (ROCEPHIN) 1 g in sodium chloride 0.9 % 100 mL IVPB        1 g 200 mL/hr over 30 Minutes Intravenous Every 24 hours 01/08/23 0507     01/07/23 1945  cefTRIAXone (ROCEPHIN) 1 g in sodium chloride 0.9 % 100 mL IVPB        1 g 200 mL/hr over 30 Minutes Intravenous  Once 01/07/23 1939 01/07/23 2015       Nutritional status:  Body mass index is 34.18 kg/m.          Objective: Vitals:   01/13/23 1041 01/13/23 1433  BP: (!) 150/93 121/73  Pulse:  79  Resp:  18  Temp:  98.7 F (37.1  C)  SpO2:  95%    Intake/Output Summary (Last 24 hours) at 01/13/2023 1543 Last data filed at 01/13/2023 0500 Gross per 24 hour  Intake 580.02 ml  Output 1550 ml  Net -969.98 ml   Filed Weights   01/07/23 2016  Weight: 114.3 kg   Weight change:  Body mass index is 34.18 kg/m.   Physical Exam: General exam: Pleasant, elderly Caucasian male.  Not in distress Skin: No rashes, lesions or ulcers. HEENT: Atraumatic, normocephalic, no obvious bleeding Lungs: Clear to auscultation bilaterally CVS: Regular rate and rhythm, no murmur GI/Abd: soft, nondistended, bowel sound present, suprapubic catheter site with granulation tissue and crusts.  Urethral bleeding continues.  Urology following. CNS: Alert, awake, hard of hearing but oriented x 3 otherwise  psychiatry: Mood appropriate Extremities: No pedal edema, no calf tenderness  Data Review: I have personally reviewed the laboratory data and studies available.  F/u labs ordered Unresulted Labs (From admission, onward)     Start     Ordered   01/13/23 0500  CBC with Differential/Platelet  Daily,   R     Question:  Specimen collection method  Answer:  Lab=Lab collect   01/12/23 1428   01/13/23 0500  Basic metabolic panel  Daily,   R     Question:  Specimen collection method  Answer:  Lab=Lab collect   01/12/23 1428            Total time spent in review of labs and imaging, patient evaluation, formulation of plan, documentation and  communication with family: 45 minutes  Signed, Lorin Glass, MD [facility] 01/13/2023

## 2023-01-13 NOTE — Progress Notes (Signed)
Erie Noe daughter- [phone]

## 2023-01-13 NOTE — Progress Notes (Signed)
IR team aware of request for prostate embo due to persistent bleeding from urethra. Has SP tube which has clear UOP.  Chart reviewed and case discussed with Dr. Loreta Ave. Hemodynamically stable. Will make pt NPO p MN and Monday IR team will review for potential case timing.  Brayton El PA-C Interventional Radiology 01/13/2023 11:16 AM

## 2023-01-13 NOTE — Progress Notes (Signed)
Subjective: Patient reports increased bleeding per urethra. Urine clear from SP tube  Objective: Vital signs in last 24 hours: Temp:  [97.6 F (36.4 C)-98 F (36.7 C)] 97.6 F (36.4 C) (07/28 0430) Pulse Rate:  [81-90] 90 (07/28 0430) Resp:  [18-20] 18 (07/28 0430) BP: (140-167)/(75-107) 150/93 (07/28 1041) SpO2:  [95 %-96 %] 96 % (07/28 0430)  Intake/Output from previous day: 07/27 0701 - 07/28 0700 In: 700 [P.O.:600; IV Piggyback:100] Out: 2350 [Urine:2350] Intake/Output this shift: No intake/output data recorded.  Physical Exam:  General:alert, cooperative, and appears stated age GI: soft, non tender, normal bowel sounds, no palpable masses, no organomegaly, no inguinal hernia Male genitalia: no testicular masses Penis: uncircumcised and scant bloody discharge Urethral Meatus: normal Extremities: extremities normal, atraumatic, no cyanosis or edema  Lab Results: Recent Labs    01/11/23 0357 01/12/23 0506 01/13/23 0429  HGB 12.6* 12.3* 11.6*  HCT 36.4* 36.5* 34.9*   BMET Recent Labs    01/12/23 0506 01/13/23 0429  NA 135 130*  K 4.1 3.9  CL 101 100  CO2 23 18*  GLUCOSE 116* 110*  BUN 15 15  CREATININE 0.76 0.71  CALCIUM 8.8* 8.4*   No results for input(s): "LABPT", "INR" in the last 72 hours. No results for input(s): "LABURIN" in the last 72 hours. Results for orders placed or performed during the hospital encounter of 01/07/23  Culture, blood (routine x 2)     Status: None   Collection Time: 01/07/23  6:39 PM   Specimen: BLOOD  Result Value Ref Range Status   Specimen Description   Final    BLOOD BLOOD RIGHT WRIST Performed at [facility], [address], [place], [place] [address]    Special Requests   Final    BOTTLES DRAWN AEROBIC AND ANAEROBIC Blood Culture results may not be optimal due to an excessive volume of blood received in culture bottles Performed at [facility], [address],  [place], [place] [address]    Culture   Final    NO GROWTH 5 DAYS Performed at [facility], [address] [place], [place] [address]    Report Status 01/12/2023 FINAL  Final  Culture, blood (routine x 2)     Status: None   Collection Time: 01/07/23  6:44 PM   Specimen: BLOOD  Result Value Ref Range Status   Specimen Description   Final    BLOOD BLOOD RIGHT HAND Performed at [facility], [address] [place], [place] [address]    Special Requests   Final    BOTTLES DRAWN AEROBIC AND ANAEROBIC Blood Culture adequate volume Performed at [facility], [address], [place], [place] [address]    Culture   Final    NO GROWTH 5 DAYS Performed at [facility], [address], [place], [place] [address]    Report Status 01/12/2023 FINAL  Final  Urine Culture     Status: Abnormal   Collection Time: 01/07/23 10:32 PM   Specimen: Urine, Catheterized  Result Value Ref Range Status   Specimen Description   Final    URINE, CATHETERIZED Performed at [facility], [address], [place], [place] [address]    Special Requests   Final    NONE Performed at [facility], [address] [place], [place] [address]    Culture MULTIPLE SPECIES PRESENT, SUGGEST RECOLLECTION (A)  Final   Report Status 01/09/2023 FINAL  Final    Studies/Results: No results found.  Assessment/Plan: 77yo with prostatic bleeding Please continue to hold blood thinners Finasteride 5mg   I discussed Prostate Artery embolization with the patient and he has agreed to proceed with the procedure. Case discussed with Dr. Ruthy Dick in Interventional radiology   LOS: 5 days   Wilkie Aye 01/13/2023, 10:56 AM

## 2023-01-13 NOTE — Plan of Care (Signed)
Plan of care and goals reviewed with patient, time given for questions and answers, patient handbook/guide at bedside.  Problem: Education: Goal: Knowledge of General Education information will improve Description: Including pain rating scale, medication(s)/side effects and non-pharmacologic comfort measures Outcome: Progressing   Problem: Health Behavior/Discharge Planning: Goal: Ability to manage health-related needs will improve Outcome: Progressing   Problem: Clinical Measurements: Goal: Ability to maintain clinical measurements within normal limits will improve Outcome: Progressing Goal: Will remain free from infection Outcome: Progressing Goal: Diagnostic test results will improve Outcome: Progressing Goal: Respiratory complications will improve Outcome: Progressing Goal: Cardiovascular complication will be avoided Outcome: Progressing   Problem: Activity: Goal: Risk for activity intolerance will decrease Outcome: Progressing   Problem: Nutrition: Goal: Adequate nutrition will be maintained Outcome: Progressing   Problem: Coping: Goal: Level of anxiety will decrease Outcome: Progressing   Problem: Elimination: Goal: Will not experience complications related to bowel motility Outcome: Progressing Goal: Will not experience complications related to urinary retention Outcome: Progressing   Problem: Pain Managment: Goal: General experience of comfort will improve Outcome: Progressing   Problem: Safety: Goal: Ability to remain free from injury will improve Outcome: Progressing   Problem: Skin Integrity: Goal: Risk for impaired skin integrity will decrease Outcome: Progressing

## 2023-01-14 ENCOUNTER — Inpatient Hospital Stay (HOSPITAL_COMMUNITY): Payer: Medicare HMO

## 2023-01-14 DIAGNOSIS — T83511A Infection and inflammatory reaction due to indwelling urethral catheter, initial encounter: Secondary | ICD-10-CM | POA: Diagnosis not present

## 2023-01-14 DIAGNOSIS — N390 Urinary tract infection, site not specified: Secondary | ICD-10-CM | POA: Diagnosis not present

## 2023-01-14 MED ORDER — FUROSEMIDE 10 MG/ML IJ SOLN
40.0000 mg | Freq: Every day | INTRAMUSCULAR | Status: DC
  Administered 2023-01-14 – 2023-01-17 (×4): 40 mg via INTRAVENOUS
  Filled 2023-01-14 (×4): qty 4

## 2023-01-14 MED ORDER — IOHEXOL 300 MG/ML  SOLN
100.0000 mL | Freq: Once | INTRAMUSCULAR | Status: AC | PRN
  Administered 2023-01-14: 100 mL via INTRAVENOUS

## 2023-01-14 MED ORDER — SODIUM CHLORIDE (PF) 0.9 % IJ SOLN
INTRAMUSCULAR | Status: AC
  Filled 2023-01-14: qty 50

## 2023-01-14 MED ORDER — ORAL CARE MOUTH RINSE: 15.0000 mL | OROMUCOSAL | Status: DC | PRN

## 2023-01-14 NOTE — Plan of Care (Signed)
  Problem: Health Behavior/Discharge Planning: Goal: Ability to manage health-related needs will improve Outcome: Not Progressing   Problem: Clinical Measurements: Goal: Ability to maintain clinical measurements within normal limits will improve Outcome: Not Progressing Goal: Will remain free from infection Outcome: Not Progressing Goal: Diagnostic test results will improve Outcome: Not Progressing

## 2023-01-14 NOTE — TOC Initial Note (Signed)
Transition of Care [facility]) - Initial/Assessment Note    Patient Details  Name: Calvin [name] [name]: [id number] Date of Birth: 1945-07-28  [facility] of [facility]) CM/SW Contact:    Larrie Kass, LCSW Phone Number: 01/14/2023, 3:43 PM  Clinical Narrative:                  CSW received consult regarding pt's daughter is expecting SNF placement. PT has recommended HH services. CSW confirmed pt is active with [facility] for [facility] services. Pt has [employer], which requires insurance authorization, if pt is wanting SNF placement PT will have to recommended this. TOC to follow.    Patient Goals and CMS Choice            Expected Discharge Plan and Services                                              Prior Living Arrangements/Services                       Activities of Daily Living Home Assistive Devices/Equipment: Dan Humphreys (specify type) ADL Screening (condition at time of admission) Patient's cognitive ability adequate to safely complete daily activities?: Yes Is the patient deaf or have difficulty hearing?: Yes Does the patient have difficulty seeing, even when wearing glasses/contacts?: No Does the patient have difficulty concentrating, remembering, or making decisions?: No Patient able to express need for assistance with ADLs?: Yes Does the patient have difficulty dressing or bathing?: Yes Independently performs ADLs?: No Communication: Independent Dressing (OT): Needs assistance Is this a change from baseline?: Pre-admission baseline Grooming: Needs assistance Is this a change from baseline?: Pre-admission baseline Feeding: Independent Bathing: Needs assistance Is this a change from baseline?: Pre-admission baseline Toileting: Needs assistance Is this a change from baseline?: Pre-admission baseline In/Out Bed: Needs assistance Is this a change from baseline?: Pre-admission baseline Walks in Home: Needs assistance Is this a change from  baseline?: Pre-admission baseline Does the patient have difficulty walking or climbing stairs?: Yes Weakness of Legs: Both Weakness of Arms/Hands: None  Permission Sought/Granted                  Emotional Assessment              Admission diagnosis:  Lower urinary tract infectious disease [N39.0] Hyponatremia [E87.1] Catheter-associated urinary tract infection (HCC) [T83.511A, N39.0] Excessive daytime sleepiness [G47.19] Patient Active Problem List   Diagnosis Date Noted   Urethral bleeding 01/10/2023   Catheter-associated urinary tract infection (HCC) 01/08/2023   SIRS (systemic inflammatory response syndrome) (HCC) 01/08/2023   Hypoxia 01/08/2023   Hyponatremia 01/08/2023   Hypokalemia 01/08/2023   Elevated brain natriuretic peptide (BNP) level 01/08/2023   A-fib (HCC) 01/06/2023   Atrial fibrillation with rapid ventricular response (HCC) 01/05/2023   Tinea cruris 11/27/2022   Hematuria of unknown cause 10/29/2022   Gross hematuria 10/28/2022   Hypertensive urgency 10/28/2022   Mixed hyperlipidemia 10/28/2022   Closed fracture of lateral malleolus of left fibula 06/15/2022   CVA (cerebral vascular accident) (HCC) 05/29/2022   Pressure injury of skin 05/29/2022   Acute CVA (cerebrovascular accident) (HCC) 05/27/2022   UTI (urinary tract infection) 05/27/2022   Indwelling Foley catheter present 05/27/2022   Bladder outflow obstruction 03/18/2022   B12 deficiency 02/06/2022   Bilateral leg weakness 01/10/2022   Depression 12/25/2021  Alcohol abuse 12/25/2021   Primary osteoarthritis of left knee 04/23/2018   Hypogonadism in male 01/19/2016   Foot swelling 01/19/2016   Insomnia 10/30/2013   INTERNAL HEMORRHOIDS WITH OTHER COMPLICATION 03/29/2010   Enlarged prostate 10/25/2008   Headache 09/15/2007   Essential hypertension 07/07/2007   PCP:  [name], MD Pharmacy:   [phone] - [place], [place] - [address] AT [facility]  [address] [place] [address] Phone: [phone] Fax: [phone]     Social Determinants of Health (SDOH) Social History: SDOH Screenings   Food Insecurity: No Food Insecurity (01/08/2023)  Housing: Low Risk  (01/08/2023)  Transportation Needs: No Transportation Needs (01/08/2023)  Utilities: Not At Risk (01/08/2023)  Alcohol Screen: Medium Risk (04/14/2020)  Depression (PHQ2-9): Low Risk  (06/28/2022)  Financial Resource Strain: Low Risk  (04/14/2020)  Physical Activity: Inactive (03/08/2021)  Social Connections: Socially Isolated (03/08/2021)  Stress: No Stress Concern Present (04/14/2020)  Tobacco Use: Low Risk  (01/07/2023)   SDOH Interventions:     Readmission Risk Interventions    01/06/2023   12:32 PM 10/29/2022   12:11 PM 03/19/2022    2:17 PM  Readmission Risk Prevention Plan  Post Dischage Appt   Complete  Medication Screening   Complete  Transportation Screening Complete Complete Complete  PCP or Specialist Appt within 5-7 Days Complete    PCP or Specialist Appt within 3-5 Days  Complete   Home Care Screening Complete    Medication Review (RN CM) Complete    HRI or Home Care Consult  Complete   Social Work Consult for Recovery Care Planning/Counseling  Complete   Palliative Care Screening  Not Applicable   Medication Review Oceanographer)  Complete

## 2023-01-14 NOTE — Progress Notes (Signed)
OT Cancellation Note  Patient Details Name: Calvin [name] [name]: [id number] DOB: 1946-05-08   Cancelled Treatment:    Reason Eval/Treat Not Completed: Other (comment) Patient was off hall earlier this AM and this afternoon patient is sleeping in bed. OT to continue to check back as schedule will allow.   Rosalio Loud, MS Acute Rehabilitation Department Office# [phone]  01/14/2023, 2:04 PM

## 2023-01-14 NOTE — Progress Notes (Signed)
PROGRESS NOTE  Calvin [name]  DOB: [birth year]  PCP: [name], MD ZOX:[id number]  DOA: 01/07/2023  LOS: 6 days  Hospital Day: 8  Brief narrative: Calvin [name] is a 77 y.o. male with PMH significant for HTN, HLD, ischemic stroke 12/23, BPH, neurogenic bladder, suprapubic catheter in place, anxiety/depression. Recently admitted 7/20-7/21 due to obstruction of his suprapubic catheter for which he was evaluated by urology and found to have encrusted suprapubic tube which was exchanged.  He was also found to have new onset A-fib with RVR and was discharged on oral Cardizem and Xarelto. Next day, 7/22, patient was brought to the ED with a fever 100.9, lethargy and redness and malodor noted at the site of suprapubic catheter insertion.  In the ED, he was afebrile but tachycardic, tachypneic, he was noted to have a low O2 sat of 89% on room air, placed on 2 L oxygen nasal cannula. Labs showed WBC 13.1, sodium 130, lactate normal x 2, blood cultures drawn.   UA with positive nitrite, moderate leukocytes, and microscopy showing 21-50 RBCs, 21-50 WBCs, and many bacteria.  Urine culture pending.  Chest x-ray showed cardiomegaly, vascular congestion CTA chest negative for PE and showing mild bibasilar atelectatic changes.   ED physician consulted urology (Dr. Liliane Shi) who recommended starting antibiotics to treat for UTI and will consult in the morning.   Patient was started on IV ceftriaxone.  Subjective: Patient was seen and examined this morning. Propped up in bed.  Not in distress.  Event from last night noted.  Patient had a fit of cough followed by choking feeling and drop in saturation.  Improved with stabilization. Chest x-ray ordered this morning.  Assessment and plan: UTI associated with suprapubic catheter Sepsis - POA Neurogenic bladder s/p suprapubic catheter  BPH Reported fever at home Noted to have tachycardia, tachypnea, lethargy, UTI, and malodorous suprapubic catheter  insertion site Blood cultures no growth so far.  Urine culture showed multiple species present Currently on IV Rocephin.  I will continue with while having urethral bleeding.  Urethral bleeding Started on 7/25.  No trauma. Per urology note, bleeding is likely from prostatic urethra where he has an area of necrotic tissue post resection.  A large bore ureteral catheter was placed in order to tamponade the bleeding with outside of intervention. Xarelto has been stopped for last few days. Urology requested IR for prostate embolization. Continue Proscar, Flomax Recent Labs  Lab 01/07/23 1839 01/07/23 2105 01/08/23 0722 01/10/23 0359 01/11/23 0357 01/12/23 0506 01/13/23 0429 01/14/23 0411  WBC  --   --    < > 9.1 11.1* 11.8* 14.8* 18.3*  LATICACIDVEN 0.7 0.7  --   --   --   --   --   --    < > = values in this interval not displayed.   Shortness of breath and mild hypoxia  Low initial O2 sat likely due to atelectasis as seen in CT chest.  CTA chest negative for PE.   Had an event of desaturation last night.  Probably had mucous plug. Obtain chest x-ray. Continue Mucinex.  Acute on chronic diastolic CHF Essential hypertension Daughter reports shortness of breath on exertion worsening lately. Chest x-ray on admission showed cardiomegaly and vascular congestion. BNP was elevated to 227 Most recent echo done in December 2023 was showing EF 60 to 65% and diastolic function could not be evaluated.  Given Lasix 40 mg IV 1 dose on 7/24. PTA meds-Lasix 20 mg daily.  Currently  on hold.  To resume once bleeding stops. Net IO Since Admission: -12,121.42 mL [01/14/23 1438] Continue to monitor for daily intake output, weight, blood pressure, BNP, renal function and electrolytes. Recent Labs  Lab 01/07/23 1834 01/08/23 0800 01/10/23 0359 01/11/23 0357 01/12/23 0506 01/13/23 0429 01/14/23 0411  BNP 226.8*  --   --   --   --   --   --   BUN 13  --  16 17 15 15 12   CREATININE 0.75  --   0.77 0.72 0.76 0.71 0.75  NA 130*  --  135 132* 135 130* 134*  K 3.4*  --  3.2* 3.5 4.1 3.9 3.7  MG  --  2.0  --   --   --   --   --    Paroxysmal A-fib Continue Cardizem.  Metoprolol as needed Xarelto on hold due to urethral bleeding  Hyperlipidemia Continue Lipitor   Mild hyponatremia Continue gentle IV fluid hydration with normal saline Recent Labs  Lab 01/07/23 1834 01/10/23 0359 01/11/23 0357 01/12/23 0506 01/13/23 0429 01/14/23 0411  NA 130* 135 132* 135 130* 134*   Mild hypokalemia Potassium level low this morning at 3.2.  Replacement given. Recent Labs  Lab 01/08/23 0800 01/10/23 0359 01/11/23 0357 01/12/23 0506 01/13/23 0429 01/14/23 0411  K  --  3.2* 3.5 4.1 3.9 3.7  MG 2.0  --   --   --   --   --    Anxiety/depression Not on meds  Diarrhea/constipation Diarrhea improved after he was started on Banatrol and Imodium as needed.  Imodium has been stopped.  Okay to continue Banatrol  Impaired mobility PT eval obtained.  Home Health PT versus SNF.   Mobility: PT eval  Goals of care   Code Status: Full Code     DVT prophylaxis:  SCDs Start: 01/08/23 0506   Antimicrobials: IV Rocephin Fluid: None currently Consultants: Urology Family Communication: None at bedside  Status: Inpatient Level of care:  Progressive   Patient from: Home Anticipated d/c to: Pending clinical course Needs to continue in-hospital care:  Continue plan to do embolization of prostate in the next 1 to 2 days   Diet:  Diet Order             Diet NPO time specified Except for: Sips with Meds  Diet effective midnight                   Scheduled Meds:  benzonatate  100 mg Oral TID   dextromethorphan-guaiFENesin  1 tablet Oral BID   diltiazem  180 mg Oral Daily   fiber supplement (BANATROL TF)  60 mL Oral BID   finasteride  5 mg Oral Daily    PRN meds: acetaminophen **OR** acetaminophen, levalbuterol, metoprolol tartrate, ondansetron (ZOFRAN) IV, mouth  rinse   Infusions:   cefTRIAXone (ROCEPHIN)  IV Stopped (01/13/23 2237)    Antimicrobials: Anti-infectives (From admission, onward)    Start     Dose/Rate Route Frequency Ordered Stop   01/08/23 1900  cefTRIAXone (ROCEPHIN) 1 g in sodium chloride 0.9 % 100 mL IVPB        1 g 200 mL/hr over 30 Minutes Intravenous Every 24 hours 01/08/23 0507     01/07/23 1945  cefTRIAXone (ROCEPHIN) 1 g in sodium chloride 0.9 % 100 mL IVPB        1 g 200 mL/hr over 30 Minutes Intravenous  Once 01/07/23 1939 01/07/23 2015       Nutritional  status:  Body mass index is 34.18 kg/m.          Objective: Vitals:   01/14/23 0602 01/14/23 1159  BP: (!) 151/104 (!) 164/103  Pulse:  84  Resp: (!) 22 17  Temp: 97.6 F (36.4 C) 98.4 F (36.9 C)  SpO2: 96% 95%    Intake/Output Summary (Last 24 hours) at 01/14/2023 1438 Last data filed at 01/14/2023 0259 Gross per 24 hour  Intake 320 ml  Output 3150 ml  Net -2830 ml   Filed Weights   01/07/23 2016  Weight: 114.3 kg   Weight change:  Body mass index is 34.18 kg/m.   Physical Exam: General exam: Pleasant, elderly Caucasian male.  Not in distress Skin: No rashes, lesions or ulcers. HEENT: Atraumatic, normocephalic, no obvious bleeding Lungs: Clear to auscultation bilaterally.  No crackles or wheezing. CVS: Regular rate and rhythm, no murmur GI/Abd: soft, nondistended, bowel sound present, suprapubic catheter site with granulation tissue and crusts.  Urethral bleeding continues.   CNS: Alert, awake, hard of hearing but oriented x 3 otherwise  psychiatry: Mood appropriate Extremities: No pedal edema, no calf tenderness  Data Review: I have personally reviewed the laboratory data and studies available.  F/u labs ordered Unresulted Labs (From admission, onward)     Start     Ordered   01/13/23 0500  CBC with Differential/Platelet  Daily,   R     Question:  Specimen collection method  Answer:  Lab=Lab collect   01/12/23 1428    01/13/23 0500  Basic metabolic panel  Daily,   R     Question:  Specimen collection method  Answer:  Lab=Lab collect   01/12/23 1428            Total time spent in review of labs and imaging, patient evaluation, formulation of plan, documentation and communication with family: 45 minutes  Signed, Lorin Glass, MD [facility] 01/14/2023

## 2023-01-14 NOTE — Progress Notes (Signed)
     Subjective: Urethral bleeding improved but persisting.  Objective: Vital signs in last 24 hours: Temp:  [97.6 F (36.4 C)-98.7 F (37.1 C)] 98.4 F (36.9 C) (07/29 1159) Pulse Rate:  [79-99] 84 (07/29 1159) Resp:  [17-22] 17 (07/29 1159) BP: (121-176)/(73-104) 164/103 (07/29 1159) SpO2:  [93 %-97 %] 95 % (07/29 1159)  Intake/Output from previous day: 07/28 0701 - 07/29 0700 In: 320 [P.O.:220; IV Piggyback:100] Out: 3700 [Urine:3700]  Intake/Output this shift: No intake/output data recorded.  Physical Exam:  General: Alert and oriented CV: No cyanosis Lungs: equal chest rise Abdomen: Soft, NTND, no rebound or guarding Gu: SPT in place. Clear fluid irrigated from bladder. Frank bleeding from penile urethra.  Lab Results: Recent Labs    01/12/23 0506 01/13/23 0429 01/14/23 0411  HGB 12.3* 11.6* 12.5*  HCT 36.5* 34.9* 36.4*   BMET Recent Labs    01/13/23 0429 01/14/23 0411  NA 130* 134*  K 3.9 3.7  CL 100 101  CO2 18* 23  GLUCOSE 110* 125*  BUN 15 12  CREATININE 0.71 0.75  CALCIUM 8.4* 8.9     Studies/Results: No results found.  Assessment/Plan: #SPT Healing appropriately.  There is a little bit of fibrinous exudate and catheter is frequently pinned between his abdomen and pubis, causing some minor erosion on the lateral ends of the incision.  Urine remains clear  # Urethral bleeding Necrotic area of prostatic urethra was resected.  It appears that during coughing fit patient has broken this area loose and is not bleeding significantly from the urethra.  His Xarelto has been held for the time being. He continued to bleed over the weekend after Xarelto washout. Shared decision was made to proceed with prostate artery embolization today.  Case and plan discussed with Dr. Ronne Binning   LOS: 6 days   Elmon Kirschner, NP [facility] Pager: [phone]  01/14/2023, 12:55 PM

## 2023-01-14 NOTE — Care Management Important Message (Signed)
Important Message  Patient Details IM Letter given. Name: Calvin [name] [name]: [id number] Date of Birth: 11-09-1945   Medicare Important Message Given:  Yes     Caren Macadam 01/14/2023, 3:37 PM

## 2023-01-15 DIAGNOSIS — N390 Urinary tract infection, site not specified: Secondary | ICD-10-CM | POA: Diagnosis not present

## 2023-01-15 DIAGNOSIS — T83511A Infection and inflammatory reaction due to indwelling urethral catheter, initial encounter: Secondary | ICD-10-CM | POA: Diagnosis not present

## 2023-01-15 MED ORDER — ACETAMINOPHEN 325 MG PO TABS
ORAL_TABLET | ORAL | Status: AC
  Filled 2023-01-15: qty 2

## 2023-01-15 NOTE — Plan of Care (Signed)
  Problem: Education: Goal: Knowledge of General Education information will improve Description Including pain rating scale, medication(s)/side effects and non-pharmacologic comfort measures Outcome: Progressing   

## 2023-01-15 NOTE — Progress Notes (Signed)
Subjective: Calvin [name]. Pt is expresses frustration with daily NPO status.  Objective: Vital signs in last 24 hours: Temp:  [98 F (36.7 C)-98.4 F (36.9 C)] 98 F (36.7 C) (07/30 0915) Pulse Rate:  [77-84] 77 (07/30 0915) Resp:  [17-19] 18 (07/30 0915) BP: (137-164)/(91-103) 137/91 (07/30 0915) SpO2:  [95 %-97 %] 97 % (07/30 0915)  Intake/Output from previous day: 07/29 0701 - 07/30 0700 In: -  Out: 2200 [Urine:2200]  Intake/Output this shift: No intake/output data recorded.  Physical Exam:  General: Alert and oriented CV: No cyanosis Lungs: equal chest rise Abdomen: Soft, NTND, no rebound or guarding Gu: SPT in place. Clear fluid irrigated from bladder. 65f foley in penile urethra, capped  Lab Results: Recent Labs    01/13/23 0429 01/14/23 0411 01/15/23 0407  HGB 11.6* 12.5* 11.6*  HCT 34.9* 36.4* 34.5*   BMET Recent Labs    01/14/23 0411 01/15/23 0407  NA 134* 136  K 3.7 3.6  CL 101 100  CO2 23 24  GLUCOSE 125* 120*  BUN 12 15  CREATININE 0.75 0.88  CALCIUM 8.9 9.2     Studies/Results: CT Angio Abd/Pel w/ and/or w/o  Result Date: 01/14/2023 CLINICAL DATA:  77 year old male with history of prostatic hematuria. EXAM: CT ANGIOGRAPHY ABDOMEN AND PELVIS WITH CONTRAST AND WITHOUT CONTRAST TECHNIQUE: Multidetector CT imaging of the abdomen and pelvis was performed using the standard protocol during bolus administration of intravenous contrast. Multiplanar reconstructed images and MIPs were obtained and reviewed to evaluate the vascular anatomy. RADIATION DOSE REDUCTION: This exam was performed according to the departmental dose-optimization program which includes automated exposure control, adjustment of the mA and/or kV according to patient size and/or use of iterative reconstruction technique. CONTRAST:  OMNIPAQUE IOHEXOL 300 MG/ML  SOLN COMPARISON:  06/21/2022, 01/07/2023 FINDINGS: VASCULAR Aorta: Normal caliber aorta without aneurysm, dissection,  vasculitis or significant stenosis. Scattered atherosclerotic calcifications. Celiac: Patent without evidence of aneurysm, dissection, vasculitis or significant stenosis. SMA: Patent without evidence of aneurysm, dissection, vasculitis or significant stenosis. Renals: Single bilateral renal arteries are patent without evidence of aneurysm, dissection, vasculitis, fibromuscular dysplasia or significant stenosis. IMA: Patent without evidence of aneurysm, dissection, vasculitis or significant stenosis. Inflow: Patent without evidence of aneurysm, dissection, vasculitis or significant stenosis. Proximal Outflow: Bilateral common femoral and visualized portions of the superficial and profunda femoral arteries are patent without evidence of aneurysm, dissection, vasculitis or significant stenosis. Bilateral Yamaki Group A internal iliac artery branch types. The right prostatic artery arises from the proximal internal pudendal artery. The left prostatic artery arises from the proximal obturator artery. Veins: The hepatic veins are widely patent. The portal system is widely patent and normal in caliber. The renal veins are patent bilaterally in standard anatomic configuration. No evidence of iliocaval thrombosis or anomaly. Review of the MIP images confirms the above findings. NON-VASCULAR Lower chest: Bibasilar subsegmental atelectasis, slightly progressed from comparison. Hepatobiliary: No new focal liver abnormality is seen. Similar appearing multifocal bilobar simple hepatic cysts, the largest in the left lobe measuring up to 2.6 cm. No gallstones, gallbladder wall thickening, or biliary dilatation. Pancreas: Unremarkable. No pancreatic ductal dilatation or surrounding inflammatory changes. Spleen: Normal in size without focal abnormality. Adrenals/Urinary Tract: Adrenal glands are unremarkable. Unchanged scattered simple renal cysts, none requiring additional follow-up. Kidneys are normal, without renal calculi, focal  lesion, or hydronephrosis. Balloon retention suprapubic and trans urethral Foley type catheter is in place. The bladder is completely decompressed. Stomach/Bowel: Stomach is within normal limits. Appendix appears  surgically absent. No evidence of bowel wall thickening, distention, or inflammatory changes. Lymphatic: No abdominopelvic lymphadenopathy. Reproductive: Prostate gland is enlarged measuring proximally 6.5 x 5.9 x 9.9 cm with an estimated volume of 199 g. Other: Small fat containing inguinal hernias bilaterally. No abdominopelvic collections. Musculoskeletal: No acute osseous abnormality. Mild multilevel degenerative changes of the visualized thoracolumbar spine. IMPRESSION: VASCULAR 1. No acute vascular abnormality in the abdomen or pelvis. 2. [place] type IV right prostatic artery and type II left prostatic artery. Bilateral Yamaki Group A internal iliac artery branching configuration. 3.  Aortic Atherosclerosis (ICD10-I70.0). NON-VASCULAR 1. Slightly worsened bibasilar subsegmental atelectasis. 2. Prostatomegaly with estimated volume of 199 g. 3. Decompressed bladder with indwelling suprapubic and Foley catheters. Marliss Coots, MD [facility] Electronically Signed   By: Marliss Coots M.D.   On: 01/14/2023 15:19   DG Chest 2 View  Result Date: 01/14/2023 CLINICAL DATA:  Dyspnea EXAM: CHEST - 2 VIEW COMPARISON:  01/07/2023 FINDINGS: Cardiomegaly. Mild, diffuse bilateral interstitial pulmonary opacity. Disc degenerative disease of the thoracic spine. IMPRESSION: Cardiomegaly with mild, diffuse bilateral interstitial pulmonary opacity, consistent with edema or atypical/viral infection. No focal airspace opacity. Electronically Signed   By: Jearld Lesch M.D.   On: 01/14/2023 15:19    Assessment/Plan: #SPT Healing appropriately.  There is a little bit of fibrinous exudate and catheter is frequently pinned between his abdomen and pubis,  causing some minor erosion on the lateral ends of the incision.  Urine remains clear  # Urethral bleeding Necrotic area of prostatic urethra was resected.  It appears that during coughing fit patient has bled heavily from this area.  His Xarelto is on hold.  Bleeding has largely resolved, but considering Xarelto will need to be restarted, and he was over two weeks post op when he bled, we will still request prostate artery embolization today.   Case and plan discussed with Dr. Alvester Morin   LOS: 7 days   Elmon Kirschner, NP [facility] Pager: [phone]  01/15/2023, 9:32 AM

## 2023-01-15 NOTE — Plan of Care (Signed)

## 2023-01-15 NOTE — Plan of Care (Signed)

## 2023-01-15 NOTE — Progress Notes (Signed)
PROGRESS NOTE  Calvin [name]  DOB: [birth year]  PCP: [name], MD ZOX:[id number]  DOA: 01/07/2023  LOS: 7 days  Hospital Day: 9  Brief narrative: Calvin [name] is a 77 y.o. male with PMH significant for HTN, HLD, ischemic stroke 12/23, BPH, neurogenic bladder, suprapubic catheter in place, anxiety/depression. Recently admitted 7/20-7/21 due to obstruction of his suprapubic catheter for which he was evaluated by urology and found to have encrusted suprapubic tube which was exchanged.  He was also found to have new onset A-fib with RVR and was discharged on oral Cardizem and Xarelto. Next day, 7/22, patient was brought to the ED with a fever 100.9, lethargy and redness and malodor noted at the site of suprapubic catheter insertion.  In the ED, he was afebrile but tachycardic, tachypneic, he was noted to have a low O2 sat of 89% on room air, placed on 2 L oxygen nasal cannula. Labs showed WBC 13.1, sodium 130, lactate normal x 2, blood cultures drawn.   UA with positive nitrite, moderate leukocytes, and microscopy showing 21-50 RBCs, 21-50 WBCs, and many bacteria.  Urine culture pending.  Chest x-ray showed cardiomegaly, vascular congestion CTA chest negative for PE and showing mild bibasilar atelectatic changes.   ED physician consulted urology (Dr. Liliane Shi) who recommended starting antibiotics to treat for UTI and will consult in the morning.   Patient was started on IV ceftriaxone.  Subjective: Patient was seen and examined this morning. Sitting up in recliner.  Not in distress.  No new symptoms.  Breathing better.  Not on supplemental oxygen today.  Assessment and plan: UTI associated with suprapubic catheter Sepsis - POA Neurogenic bladder s/p suprapubic catheter  BPH Reported fever at home Noted to have tachycardia, tachypnea, lethargy, UTI, and malodorous suprapubic catheter insertion site Blood cultures no growth so far.  Urine culture showed multiple species  present Currently on IV Rocephin.  I will continue with while having urethral bleeding.  Urethral bleeding Started on 7/25.  No trauma. Per urology note, bleeding is likely from prostatic urethra where he has an area of necrotic tissue post resection.  A large bore ureteral catheter was placed in order to tamponade the bleeding with outside of intervention. Xarelto has been stopped for last few days. Urology requested IR for prostate embolization.  Tentative plan tomorrow. Continue Proscar, Flomax Recent Labs  Lab 01/11/23 0357 01/12/23 0506 01/13/23 0429 01/14/23 0411 01/15/23 0407  WBC 11.1* 11.8* 14.8* 18.3* 10.7*   Acute on chronic diastolic CHF Essential hypertension Daughter reports shortness of breath on exertion worsening lately. Chest x-ray on admission showed cardiomegaly and vascular congestion. BNP was elevated to 227 Most recent echo done in December 2023 was showing EF 60 to 65% and diastolic function could not be evaluated.  Patient had acute episode of shortness of breath on 7/29.  Chest x-ray showed cardiomegaly with mild, diffuse bilateral interstitial pulm opacities. Started on Lasix IV 40 mg daily.  Breathing feels better today.  Not on supplemental oxygen. Continue Mucinex. Net IO Since Admission: -14,651.42 mL [01/15/23 1506] Continue to monitor for daily intake output, weight, blood pressure, BNP, renal function and electrolytes. Recent Labs  Lab 01/11/23 0357 01/12/23 0506 01/13/23 0429 01/14/23 0411 01/15/23 0407  BUN 17 15 15 12 15   CREATININE 0.72 0.76 0.71 0.75 0.88  NA 132* 135 130* 134* 136  K 3.5 4.1 3.9 3.7 3.6   Paroxysmal A-fib Continue Cardizem.  Metoprolol as needed Xarelto on hold due to urethral bleeding  Hyperlipidemia Continue Lipitor   Mild hyponatremia Continue gentle IV fluid hydration with normal saline Recent Labs  Lab 01/10/23 0359 01/11/23 0357 01/12/23 0506 01/13/23 0429 01/14/23 0411 01/15/23 0407  NA 135 132*  135 130* 134* 136   Mild hypokalemia Potassium level low this morning at 3.2.  Replacement given. Recent Labs  Lab 01/11/23 0357 01/12/23 0506 01/13/23 0429 01/14/23 0411 01/15/23 0407  K 3.5 4.1 3.9 3.7 3.6   Anxiety/depression Not on meds  Diarrhea/constipation Diarrhea improved after he was started on Banatrol and Imodium as needed.  Imodium has been stopped.  Okay to continue Banatrol  Impaired mobility PT eval obtained.  Home Health PT versus SNF.   Mobility: PT eval  Goals of care   Code Status: Full Code     DVT prophylaxis:  SCDs Start: 01/08/23 0506   Antimicrobials: IV Rocephin Fluid: None currently Consultants: Urology Family Communication: None at bedside  Status: Inpatient Level of care:  Progressive   Patient from: Home Anticipated d/c to: Pending clinical course Needs to continue in-hospital care:  Continue plan to do embolization of prostate tomorrow   Diet:  Diet Order             Diet regular Room service appropriate? Yes; Fluid consistency: Thin  Diet effective now                   Scheduled Meds:  benzonatate  100 mg Oral TID   dextromethorphan-guaiFENesin  1 tablet Oral BID   diltiazem  180 mg Oral Daily   fiber supplement (BANATROL TF)  60 mL Oral BID   finasteride  5 mg Oral Daily   furosemide  40 mg Intravenous Daily    PRN meds: acetaminophen **OR** acetaminophen, levalbuterol, metoprolol tartrate, ondansetron (ZOFRAN) IV, mouth rinse   Infusions:   cefTRIAXone (ROCEPHIN)  IV 1 g (01/14/23 1810)    Antimicrobials: Anti-infectives (From admission, onward)    Start     Dose/Rate Route Frequency Ordered Stop   01/08/23 1900  cefTRIAXone (ROCEPHIN) 1 g in sodium chloride 0.9 % 100 mL IVPB        1 g 200 mL/hr over 30 Minutes Intravenous Every 24 hours 01/08/23 0507     01/07/23 1945  cefTRIAXone (ROCEPHIN) 1 g in sodium chloride 0.9 % 100 mL IVPB        1 g 200 mL/hr over 30 Minutes Intravenous  Once 01/07/23  1939 01/07/23 2015       Nutritional status:  Body mass index is 34.18 kg/m.          Objective: Vitals:   01/15/23 0915 01/15/23 1226  BP: (!) 137/91 111/81  Pulse: 77 87  Resp: 18 17  Temp: 98 F (36.7 C) 97.9 F (36.6 C)  SpO2: 97% 97%    Intake/Output Summary (Last 24 hours) at 01/15/2023 1506 Last data filed at 01/15/2023 1330 Gross per 24 hour  Intake 720 ml  Output 3250 ml  Net -2530 ml   Filed Weights   01/07/23 2016  Weight: 114.3 kg   Weight change:  Body mass index is 34.18 kg/m.   Physical Exam: General exam: Pleasant, elderly Caucasian male.  Not in distress Skin: No rashes, lesions or ulcers. HEENT: Atraumatic, normocephalic, no obvious bleeding Lungs: Clear to auscultation bilaterally.  No crackles or wheezing. CVS: Regular rate and rhythm, no murmur GI/Abd: soft, nondistended, bowel sound present, suprapubic catheter site with granulation tissue and crusts.  Urethral bleeding improving but continues.  CNS: Alert, awake, hard of hearing but oriented x 3 otherwise  psychiatry: Mood appropriate Extremities: No pedal edema, no calf tenderness  Data Review: I have personally reviewed the laboratory data and studies available.  F/u labs ordered Unresulted Labs (From admission, onward)    None       Total time spent in review of labs and imaging, patient evaluation, formulation of plan, documentation and communication with family: 25 minutes  Signed, Lorin Glass, MD [facility] 01/15/2023

## 2023-01-15 NOTE — Consult Note (Signed)
Chief Complaint: Patient was seen in consultation today for prostatic bleeding  Referring Physician(s): Wilkie Aye, MD  Supervising Physician: Richarda Overlie  Patient Status: [name] - In-pt  History of Present Illness: Calvin [name] is a 77 y.o. male with PMH significant for anxiety, BPH, depression, erectile dysfunction, headache, hypertension, stroke in 2023, and neuropathy of the legs being seen today in relation to prostatic bleeding. Patient underwent suprapubic catheter placement with resection of necrotic tissue from the prostatic urethra on 12/25/22 with urology team and was subsequently hospitalized 7/20-7/22 due to obstructed suprapubic catheter which was subsequently exchanged by urology. Patient presented back to [facility] on 7/23 for fever, malodorous urine, and weakness. Patient was subsequently found to have urethral bleeding on 7/25. Per Urology team, it is suspected that patient is bleeding from previously resected necrotic prostatic tissue. IR has been consulted to evaluate patient for possible prostate artery embolization.   Past Medical History:  Diagnosis Date   Anxiety    Benign prostatic hypertrophy    COVID-19 virus infection 02/24/2020   Depression    ED (erectile dysfunction)    Headache(784.0)    History of kidney stones    Hypertension    Hypogonadism male    Low back pain 06/09/2020   Nephrolithiasis    hx of had lithotripsy in 1-10.   NEPHROLITHIASIS, HX OF 10/25/2008   Qualifier: Diagnosis of   By: Clent Ridges MD, Tera Mater      Neuromuscular disorder [facility])    neuropaty legs   Pneumonia    Pyelonephritis 02/25/2022   Stroke [facility])    fall 2023 some memory issues like time of day    Past Surgical History:  Procedure Laterality Date   colonoscopy  10/05/2020   per Dr. Russella Dar, adenomatous polyps, repeat in 3 yrs   CYSTOSCOPY W/ RETROGRADES Bilateral 12/25/2022   Procedure: CYSTOSCOPY, TRANSURETHERAL RESECTION OF PROSTATE;  Surgeon: Noel Christmas, MD;   Location: [facility] ORS;  Service: Urology;  Laterality: Bilateral;  90 MINS FRO CASE   FOOT SURGERY     INSERTION OF SUPRAPUBIC CATHETER N/A 12/25/2022   Procedure: INSERTION OF SUPRAPUBIC CATHETER;  Surgeon: Noel Christmas, MD;  Location: [facility] ORS;  Service: Urology;  Laterality: N/A;   LAPAROSCOPIC APPENDECTOMY  03/12/2012   Procedure: APPENDECTOMY LAPAROSCOPIC;  Surgeon: Shelly Rubenstein, MD;  Location: [facility] OR;  Service: General;  Laterality: N/A;   STAPLE HEMORRHOIDECTOMY  04/14/2010   per Dr. Michaell Cowing    TONSILLECTOMY      Allergies: Patient has no known allergies.  Medications: Prior to Admission medications   Medication Sig Start Date End Date Taking? Authorizing Provider  atorvastatin (LIPITOR) 20 MG tablet Take 20 mg by mouth daily. 07/30/22  Yes [provider]  cyanocobalamin (VITAMIN B12) 1000 MCG/ML injection INJECT 1 ML (1,000 MCG TOTAL) INTO THE MUSCLE ONCE A WEEK. 03/13/22  Yes [name], MD  cyclobenzaprine (FLEXERIL) 10 MG tablet Take 1 tablet (10 mg total) by mouth 3 (three) times daily as needed for muscle spasms. 12/14/22  Yes [name], MD  diltiazem (CARDIZEM CD) 180 MG 24 hr capsule Take 1 capsule (180 mg total) by mouth daily. 01/07/23  Yes Lewie Chamber, MD  docusate sodium (COLACE) 100 MG capsule Take 100 mg by mouth 2 (two) times daily.   Yes [provider]  finasteride (PROSCAR) 5 MG tablet TAKE 1 TABLET (5 MG TOTAL) BY MOUTH DAILY. 10/01/22  Yes [name], MD  fluticasone Aleda Grana) 50 MCG/ACT nasal  spray Place 1 spray into both nostrils daily. 10/10/22  Yes Omar Person, MD  folic acid (FOLVITE) 1 MG tablet Take 1 tablet (1 mg total) by mouth daily. 12/14/22  Yes [name], MD  furosemide (LASIX) 20 MG tablet Take 1 tablet (20 mg total) by mouth daily. 12/14/22  Yes [name], MD  KLOR-CON M10 10 MEQ tablet Take 1 tablet (10 mEq total) by mouth 2 (two) times daily. 12/14/22  Yes [name], MD  loperamide (IMODIUM) 2 MG  capsule Take 1 capsule (2 mg total) by mouth every 8 (eight) hours as needed for diarrhea or loose stools. 12/14/22  Yes [name], MD  Multiple Vitamin (MULTIVITAMIN WITH MINERALS) TABS tablet Take 1 tablet by mouth daily. 06/01/22  Yes Sheikh, Omair Latif, DO  ondansetron (ZOFRAN) 4 MG tablet Take 1 tablet (4 mg total) by mouth every 8 (eight) hours as needed for nausea or vomiting. 12/14/22  Yes [name], MD  rivaroxaban (XARELTO) 20 MG TABS tablet Take 1 tablet (20 mg total) by mouth daily. 01/07/23  Yes Lewie Chamber, MD  sertraline (ZOLOFT) 100 MG tablet TAKE 1 TABLET BY MOUTH EVERY DAY 01/04/23  Yes [name], MD  tamsulosin (FLOMAX) 0.4 MG CAPS capsule TAKE 1 CAPSULE BY MOUTH EVERY DAY 12/14/22  Yes [name], MD  SYRINGE-NEEDLE, DISP, 3 ML (BD ECLIPSE SYRINGE/NEEDLE) 25G X 5/8" 3 ML MISC Use as directed once a week 12/14/22   [name], MD  thiamine (VITAMIN B-1) 100 MG tablet Take 1 tablet (100 mg total) by mouth daily. Patient not taking: Reported on 01/08/2023 12/14/22   [name], MD     Family History  Problem Relation Age of Onset   Heart Problems Mother        Caused by a Virus when she was a teen   Stroke Father    Prostate cancer Father    Hypertension Other    Stroke Other    Heart disease Other        rheumatic    Coronary artery disease Other    Healthy Child    Colon cancer Neg Hx    Colon polyps Neg Hx    Esophageal cancer Neg Hx    Rectal cancer Neg Hx    Stomach cancer Neg Hx     Social History   Socioeconomic History   Marital status: Widowed    Spouse name: Not on file   Number of children: Not on file   Years of education: Not on file   Highest education level: Not on file  Occupational History   Occupation: retired    Comment: owned [employer]  Tobacco Use   Smoking status: Never   Smokeless tobacco: Never  Vaping Use   Vaping status: Never Used  Substance and Sexual Activity   Alcohol use: Not Currently     Alcohol/week: 14.0 standard drinks of alcohol    Types: 14 Standard drinks or equivalent per week    Comment: daily one drink   Drug use: No   Sexual activity: Not Currently  Other Topics Concern   Not on file  Social History Narrative   Right Handed    Lives in a one story home with a basement.    Social Determinants of Health   Financial Resource Strain: Low Risk  (04/14/2020)   Overall Financial Resource Strain (CARDIA)    Difficulty of Paying Living Expenses: Not hard at all  Food Insecurity: No Food Insecurity (01/08/2023)   Hunger Vital Sign    Worried About Running Out of Food in the Last Year: Never true    Ran Out of Food in the Last Year: Never true  Transportation Needs: No Transportation Needs (01/08/2023)   PRAPARE - Administrator, Civil Service (Medical): No    Lack of Transportation (Non-Medical): No  Physical Activity: Inactive (03/08/2021)   Exercise Vital Sign    Days of Exercise per Week: 0 days    Minutes of Exercise per Session: 0 min  Stress: No Stress Concern Present (04/14/2020)   [employer] of Occupational Health - Occupational Stress Questionnaire    Feeling of Stress : Not at all  Social Connections: Socially Isolated (03/08/2021)   Social Connection and Isolation Panel [NHANES]    Frequency of Communication with Friends and Family: Once a week    Frequency of Social Gatherings with Friends and Family: Once a week    Attends Religious Services: Never    Database administrator or Organizations: No    Attends Banker Meetings: Never    Marital Status: Widowed    Code Status: Full code  Review of Systems: A 12 point ROS discussed and pertinent positives are indicated in the HPI above.  All other systems are negative.  Review of Systems  Constitutional:  Negative for chills and fever.  Respiratory:  Positive for cough. Negative for chest tightness and shortness of breath.   Cardiovascular:  Negative for chest pain and  leg swelling.  Gastrointestinal:  Negative for abdominal pain, diarrhea, nausea and vomiting.  Genitourinary:  Positive for penile discharge.       Patient has had bleeding from urethra  Neurological:  Negative for dizziness and headaches.  Psychiatric/Behavioral:  Negative for confusion.     Vital Signs: BP 111/81 (BP Location: Right Arm)   Pulse 87   Temp 97.9 F (36.6 C) (Oral)   Resp 17   Ht 6' (1.829 m)   Wt 252 lb (114.3 kg)   SpO2 97%   BMI 34.18 kg/m    Physical Exam Vitals reviewed.  Constitutional:      General: He is not in acute distress. Cardiovascular:     Rate and Rhythm: Normal rate and regular rhythm.     Pulses: Normal pulses.     Heart sounds: Normal heart sounds.  Pulmonary:     Effort: Pulmonary effort is normal.     Breath sounds: Normal breath sounds.  Abdominal:     Palpations: Abdomen is soft.     Tenderness: There is no abdominal tenderness.  Genitourinary:    Comments: Patient with suprapubic catheter, clear yellow urine. Capped penile foley catheter in place as well Musculoskeletal:     Right lower leg: No edema.     Left lower leg: No edema.  Skin:    General: Skin is warm and dry.  Neurological:     Mental Status: He is alert.     Imaging: CT Angio Abd/Pel w/ and/or w/o  Result Date: 01/14/2023 CLINICAL DATA:  77 year old male with history of prostatic hematuria. EXAM: CT ANGIOGRAPHY ABDOMEN AND PELVIS WITH CONTRAST AND WITHOUT CONTRAST TECHNIQUE: Multidetector CT imaging of the abdomen and pelvis was performed using the standard protocol during bolus administration of intravenous contrast. Multiplanar reconstructed images and MIPs were obtained and reviewed to evaluate the vascular anatomy. RADIATION DOSE REDUCTION: This exam was performed according to the departmental dose-optimization program which includes automated  exposure control, adjustment of the mA and/or kV according to patient size and/or use of iterative reconstruction  technique. CONTRAST:  OMNIPAQUE IOHEXOL 300 MG/ML  SOLN COMPARISON:  06/21/2022, 01/07/2023 FINDINGS: VASCULAR Aorta: Normal caliber aorta without aneurysm, dissection, vasculitis or significant stenosis. Scattered atherosclerotic calcifications. Celiac: Patent without evidence of aneurysm, dissection, vasculitis or significant stenosis. SMA: Patent without evidence of aneurysm, dissection, vasculitis or significant stenosis. Renals: Single bilateral renal arteries are patent without evidence of aneurysm, dissection, vasculitis, fibromuscular dysplasia or significant stenosis. IMA: Patent without evidence of aneurysm, dissection, vasculitis or significant stenosis. Inflow: Patent without evidence of aneurysm, dissection, vasculitis or significant stenosis. Proximal Outflow: Bilateral common femoral and visualized portions of the superficial and profunda femoral arteries are patent without evidence of aneurysm, dissection, vasculitis or significant stenosis. Bilateral Yamaki Group A internal iliac artery branch types. The right prostatic artery arises from the proximal internal pudendal artery. The left prostatic artery arises from the proximal obturator artery. Veins: The hepatic veins are widely patent. The portal system is widely patent and normal in caliber. The renal veins are patent bilaterally in standard anatomic configuration. No evidence of iliocaval thrombosis or anomaly. Review of the MIP images confirms the above findings. NON-VASCULAR Lower chest: Bibasilar subsegmental atelectasis, slightly progressed from comparison. Hepatobiliary: No new focal liver abnormality is seen. Similar appearing multifocal bilobar simple hepatic cysts, the largest in the left lobe measuring up to 2.6 cm. No gallstones, gallbladder wall thickening, or biliary dilatation. Pancreas: Unremarkable. No pancreatic ductal dilatation or surrounding inflammatory changes. Spleen: Normal in size without focal abnormality.  Adrenals/Urinary Tract: Adrenal glands are unremarkable. Unchanged scattered simple renal cysts, none requiring additional follow-up. Kidneys are normal, without renal calculi, focal lesion, or hydronephrosis. Balloon retention suprapubic and trans urethral Foley type catheter is in place. The bladder is completely decompressed. Stomach/Bowel: Stomach is within normal limits. Appendix appears surgically absent. No evidence of bowel wall thickening, distention, or inflammatory changes. Lymphatic: No abdominopelvic lymphadenopathy. Reproductive: Prostate gland is enlarged measuring proximally 6.5 x 5.9 x 9.9 cm with an estimated volume of 199 g. Other: Small fat containing inguinal hernias bilaterally. No abdominopelvic collections. Musculoskeletal: No acute osseous abnormality. Mild multilevel degenerative changes of the visualized thoracolumbar spine. IMPRESSION: VASCULAR 1. No acute vascular abnormality in the abdomen or pelvis. 2. [place] type IV right prostatic artery and type II left prostatic artery. Bilateral Yamaki Group A internal iliac artery branching configuration. 3.  Aortic Atherosclerosis (ICD10-I70.0). NON-VASCULAR 1. Slightly worsened bibasilar subsegmental atelectasis. 2. Prostatomegaly with estimated volume of 199 g. 3. Decompressed bladder with indwelling suprapubic and Foley catheters. Marliss Coots, MD [facility] Electronically Signed   By: Marliss Coots M.D.   On: 01/14/2023 15:19   DG Chest 2 View  Result Date: 01/14/2023 CLINICAL DATA:  Dyspnea EXAM: CHEST - 2 VIEW COMPARISON:  01/07/2023 FINDINGS: Cardiomegaly. Mild, diffuse bilateral interstitial pulmonary opacity. Disc degenerative disease of the thoracic spine. IMPRESSION: Cardiomegaly with mild, diffuse bilateral interstitial pulmonary opacity, consistent with edema or atypical/viral infection. No focal airspace opacity. Electronically Signed   By: Jearld Lesch M.D.   On:  01/14/2023 15:19   ECHOCARDIOGRAM COMPLETE  Result Date: 01/08/2023    ECHOCARDIOGRAM REPORT   Patient Name:   BENGY LESKO Date of Exam: 01/08/2023 Medical Rec #:  [id number]      Height:       72.0 in Accession #:    [id number]     Weight:  252.0 lb Date of Birth:  December 26, 1945      BSA:          2.350 m Patient Age:    77 years       BP:           155/93 mmHg Patient Gender: M              HR:           102 bpm. Exam Location:  Inpatient Procedure: 2D Echo, Color Doppler, Cardiac Doppler and Intracardiac            Opacification Agent Indications:    Elevated BNP  History:        Patient has prior history of Echocardiogram examinations, most                 recent 05/28/2022. Stroke and h/o ETOH abuse; Risk                 Factors:Hypertension and Dyslipidemia.  Sonographer:    Milbert Coulter Referring Phys: [id number] RATHORE  Sonographer Comments: Technically difficult study due to poor echo windows, suboptimal parasternal window, suboptimal apical window and patient is obese. Image acquisition challenging due to patient body habitus and Image acquisition challenging due to respiratory motion. IMPRESSIONS  1. Left ventricular ejection fraction, by estimation, is 60 to 65%. The left ventricle has normal function. The left ventricle has no regional wall motion abnormalities. There is mild concentric left ventricular hypertrophy. Left ventricular diastolic parameters are indeterminate.  2. Right ventricular systolic function is normal. The right ventricular size is normal.  3. The mitral valve is grossly normal. No evidence of mitral valve regurgitation. No evidence of mitral stenosis.  4. The aortic valve was not well visualized. Aortic valve regurgitation is not visualized. No aortic stenosis is present.  5. There is borderline dilatation of the ascending aorta, measuring 38 mm.  6. The inferior vena cava is normal in size with greater than 50% respiratory variability, suggesting right atrial  pressure of 3 mmHg. Comparison(s): No significant change from prior study. Prior images reviewed side by side. Poor quality images, but no overt regional wall motion abnormalities are seen. Incessant ectopy limits evaluation of mitral inflow/diastolic parameters. FINDINGS  Left Ventricle: Left ventricular ejection fraction, by estimation, is 60 to 65%. The left ventricle has normal function. The left ventricle has no regional wall motion abnormalities. Definity contrast agent was given IV to delineate the left ventricular  endocardial borders. The left ventricular internal cavity size was normal in size. There is mild concentric left ventricular hypertrophy. Left ventricular diastolic parameters are indeterminate. Right Ventricle: The right ventricular size is normal. No increase in right ventricular wall thickness. Right ventricular systolic function is normal. Left Atrium: Left atrial size was normal in size. Right Atrium: Right atrial size was normal in size. Pericardium: There is no evidence of pericardial effusion. Mitral Valve: The mitral valve is grossly normal. No evidence of mitral valve regurgitation. No evidence of mitral valve stenosis. Tricuspid Valve: The tricuspid valve is not well visualized. Tricuspid valve regurgitation is not demonstrated. No evidence of tricuspid stenosis. Aortic Valve: The aortic valve was not well visualized. Aortic valve regurgitation is not visualized. No aortic stenosis is present. Aortic valve mean gradient measures 5.0 mmHg. Aortic valve peak gradient measures 8.8 mmHg. Aortic valve area, by VTI measures 2.30 cm. Pulmonic Valve: The pulmonic valve was not well visualized. Pulmonic valve regurgitation is not visualized. No evidence of pulmonic stenosis. Aorta:  The aortic root is normal in size and structure. There is borderline dilatation of the ascending aorta, measuring 38 mm. Venous: The inferior vena cava is normal in size with greater than 50% respiratory variability,  suggesting right atrial pressure of 3 mmHg. IAS/Shunts: No atrial level shunt detected by color flow Doppler.  LEFT VENTRICLE PLAX 2D LVIDd:         5.10 cm LVIDs:         3.10 cm LV PW:         1.40 cm LV IVS:        1.40 cm LVOT diam:     2.10 cm LV SV:         60 LV SV Index:   25 LVOT Area:     3.46 cm  RIGHT VENTRICLE RV S prime:     22.00 cm/s TAPSE (M-mode): 2.0 cm LEFT ATRIUM             Index        RIGHT ATRIUM           Index LA Vol (A2C):   65.4 ml 27.82 ml/m  RA Area:     17.80 cm LA Vol (A4C):   70.4 ml 29.95 ml/m  RA Volume:   46.80 ml  19.91 ml/m LA Biplane Vol: 73.9 ml 31.44 ml/m  AORTIC VALVE AV Area (Vmax):    2.22 cm AV Area (Vmean):   2.20 cm AV Area (VTI):     2.30 cm AV Vmax:           148.00 cm/s AV Vmean:          106.000 cm/s AV VTI:            0.259 m AV Peak Grad:      8.8 mmHg AV Mean Grad:      5.0 mmHg LVOT Vmax:         94.70 cm/s LVOT Vmean:        67.300 cm/s LVOT VTI:          0.172 m LVOT/AV VTI ratio: 0.66  AORTA Ao Asc diam: 3.80 cm  SHUNTS Systemic VTI:  0.17 m Systemic Diam: 2.10 cm Rachelle Hora Croitoru MD Electronically signed by Thurmon Fair MD Signature Date/Time: 01/08/2023/11:09:39 AM    Final    CT Angio Chest PE W and/or Wo Contrast  Result Date: 01/07/2023 CLINICAL DATA:  Shortness of breath EXAM: CT ANGIOGRAPHY CHEST WITH CONTRAST TECHNIQUE: Multidetector CT imaging of the chest was performed using the standard protocol during bolus administration of intravenous contrast. Multiplanar CT image reconstructions and MIPs were obtained to evaluate the vascular anatomy. RADIATION DOSE REDUCTION: This exam was performed according to the departmental dose-optimization program which includes automated exposure control, adjustment of the mA and/or kV according to patient size and/or use of iterative reconstruction technique. CONTRAST:  80mL OMNIPAQUE IOHEXOL 350 MG/ML SOLN COMPARISON:  Chest x-ray from earlier in the same day. FINDINGS: Cardiovascular: Atherosclerotic  calcifications of the thoracic aorta are noted. No aneurysmal dilatation is noted. Coronary calcifications are seen. The pulmonary artery shows a normal branching pattern bilaterally. No filling defect to suggest pulmonary embolism is noted. Mediastinum/Nodes: Thoracic inlet is within normal limits. No hilar or mediastinal adenopathy is noted. The esophagus as visualized is within normal limits. Lungs/Pleura: Mild bibasilar atelectatic changes are noted. No focal nodular changes are seen. No focal confluent infiltrate is noted. Upper Abdomen: Visualized upper abdomen shows scattered hypodensities throughout the liver similar to that seen  on the prior exam most consistent with cysts. Stable right adrenal adenoma is noted similar to that seen on the prior exam Musculoskeletal: Degenerative changes of the thoracic spine are noted. No acute abnormality noted. Review of the MIP images confirms the above findings. IMPRESSION: No evidence of pulmonary emboli. Mild bibasilar atelectatic changes. Stable cysts within the liver. Right adrenal lesion is noted considered a long-term stable adrenal adenoma over multiple previous exams. Aortic Atherosclerosis (ICD10-I70.0). Electronically Signed   By: Alcide Clever M.D.   On: 01/07/2023 23:52   DG Chest Portable 1 View  Result Date: 01/07/2023 CLINICAL DATA:  Fever EXAM: PORTABLE CHEST 1 VIEW COMPARISON:  01/05/2023 FINDINGS: Cardiomegaly. Mediastinal contours within normal limits. Vascular congestion. Low lung volumes with left base atelectasis. No overt edema or effusions. No acute bony abnormality. IMPRESSION: Low lung volumes with left base atelectasis. Cardiomegaly, vascular congestion. Electronically Signed   By: Charlett Nose M.D.   On: 01/07/2023 19:30   DG Chest Port 1 View  Result Date: 01/05/2023 CLINICAL DATA:  afib EXAM: PORTABLE CHEST 1 VIEW COMPARISON:  CXR 05/27/22 FINDINGS: Low lung volumes. No pleural effusion. No pneumothorax. Likely unchanged cardiac and  mediastinal contours when accounting for differences in lung volume. Focal airspace opacity. There are prominent bilateral interstitial opacities could represent pulmonary venous congestion. No radiographically apparent displaced rib fractures. Visualized upper abdomen is unremarkable. IMPRESSION: Prominent bilateral interstitial opacities could represent pulmonary venous congestion. Electronically Signed   By: Lorenza Cambridge M.D.   On: 01/05/2023 15:19   DG C-Arm 1-60 Min-No Report  Result Date: 12/25/2022 Fluoroscopy was utilized by the requesting physician.  No radiographic interpretation.    Labs:  CBC: Recent Labs    01/12/23 0506 01/13/23 0429 01/14/23 0411 01/15/23 0407  WBC 11.8* 14.8* 18.3* 10.7*  HGB 12.3* 11.6* 12.5* 11.6*  HCT 36.5* 34.9* 36.4* 34.5*  PLT 321 342 418* 406*    COAGS: Recent Labs    05/27/22 1215  INR 1.1  APTT 30    BMP: Recent Labs    01/12/23 0506 01/13/23 0429 01/14/23 0411 01/15/23 0407  NA 135 130* 134* 136  K 4.1 3.9 3.7 3.6  CL 101 100 101 100  CO2 23 18* 23 24  GLUCOSE 116* 110* 125* 120*  BUN 15 15 12 15   CALCIUM 8.8* 8.4* 8.9 9.2  CREATININE 0.76 0.71 0.75 0.88  GFRNONAA >60 >60 >60 >60    LIVER FUNCTION TESTS: Recent Labs    06/21/22 1010 10/28/22 1341 10/29/22 0423 01/07/23 1834  BILITOT 1.4* 0.9 1.5* 1.8*  AST 21 25 26  13*  ALT 18 29 32 19  ALKPHOS 86 77 87 82  PROT 7.8 7.3 7.4 7.3  ALBUMIN 4.3 4.0 4.0 3.5    TUMOR MARKERS: No results for input(s): "AFPTM", "CEA", "CA199", "CHROMGRNA" in the last 8760 hours.  Assessment and Plan:  Montez Neudecker is a 77 yo male being seen today in relation to prostatic bleeding. Patient has been experiencing urethral bleeding that is suspected to be from a necrotic portion of the prostatic urethra that was resected on 12/25/22. Bleeding is currently minimal as patient has had Xarelto held and has a Foley catheter in place to help tamponade the area. IR has been consulted for  image-guided prostate artery embolization to reduce the bleeding. Case was initially reviewed by Dr Loreta Ave and is now tentatively scheduled for 01/16/23 with Dr Elby Showers. The patient will be made NPO at midnight tonight. Pre-procedural labs and medication have been ordered.  The Risks and benefits of prostate artery embolization were discussed with the patient including, but not limited to bleeding, infection, vascular injury, post operative pain, or contrast induced renal failure.  This procedure involves the use of X-rays and because of the nature of the planned procedure, it is possible that we will have prolonged use of X-ray fluoroscopy.  Potential radiation risks to you include (but are not limited to) the following: - A slightly elevated risk for cancer several years later in life. This risk is typically less than 0.5% percent. This risk is low in comparison to the normal incidence of human cancer, which is 33% for women and 50% for men according to the [employer]. - Radiation induced injury can include skin redness, resembling a rash, tissue breakdown / ulcers and hair loss (which can be temporary or permanent).   The likelihood of either of these occurring depends on the difficulty of the procedure and whether you are sensitive to radiation due to previous procedures, disease, or genetic conditions.   IF your procedure requires a prolonged use of radiation, you will be notified and given written instructions for further action.  It is your responsibility to monitor the irradiated area for the 2 weeks following the procedure and to notify your physician if you are concerned that you have suffered a radiation induced injury.    All of the patient's questions were answered, patient is agreeable to proceed. Consent signed and in chart.   Thank you for this interesting consult.  I greatly enjoyed meeting RION SLAYDEN and look forward to participating in their care.  A copy of  this report was sent to the requesting provider on this date.  Electronically Signed: Kennieth Francois, PA-C 01/15/2023, 4:10 PM   I spent a total of 40 Minutes    in face to face in clinical consultation, greater than 50% of which was counseling/coordinating care for prostatic bleeding.

## 2023-01-15 NOTE — Progress Notes (Signed)
Physical Therapy Treatment Patient Details Name: Calvin [name] [name]: [id number] DOB: 06/23/1945 Today's Date: 01/15/2023   History of Present Illness 77 y.o. male admitted 01/07/23 with sepsis, UTI associated with suprapubic catheter, urethral bleeding. PMH significant for HTN, HLD, ischemic stroke 12/23, BPH, neurogenic bladder, suprapubic catheter in place, anxiety/depression.    PT Comments  Pt agreeable to working with therapy. He expresses desire to mobilize more-hopefully mobility team can work with him in addition to PT/OT. He tolerated activity fairly well but endorses weakness.     If plan is discharge home, recommend the following: A little help with walking and/or transfers;A little help with bathing/dressing/bathroom;Help with stairs or ramp for entrance;Assistance with cooking/housework   Can travel by private vehicle        Equipment Recommendations  None recommended by PT    Recommendations for Other Services       Precautions / Restrictions Precautions Precautions: Fall Precaution Comments: urethral bleeding/clotting Restrictions Weight Bearing Restrictions: No     Mobility  Bed Mobility Overal bed mobility: Needs Assistance Bed Mobility: Sit to Supine     Supine to sit: Supervision     General bed mobility comments: Supv for safety, lines.    Transfers Overall transfer level: Needs assistance Equipment used: Rolling walker (2 wheels) Transfers: Sit to/from Stand Sit to Stand: Min guard           General transfer comment: Cues for safety, hand placement. Increased time.    Ambulation/Gait Ambulation/Gait assistance: Min assist Gait Distance (Feet): 75 Feet Assistive device: Rolling walker (2 wheels) Gait Pattern/deviations: Step-through pattern, Decreased stride length, Decreased step length - left, Decreased step length - right       General Gait Details: Intermittent A to steady. Decreased stepl lengths bilaterally. Cues for increased  step lengths. LE instability/weakness observed throughout gait   Stairs             Wheelchair Mobility     Tilt Bed    Modified Rankin (Stroke Patients Only)       Balance Overall balance assessment: Needs assistance         Standing balance support: Bilateral upper extremity supported, During functional activity, Reliant on assistive device for balance Standing balance-Leahy Scale: Poor                              Cognition Arousal/Alertness: Awake/alert Behavior During Therapy: Flat affect Overall Cognitive Status: Within Functional Limits for tasks assessed                                          Exercises      General Comments        Pertinent Vitals/Pain Pain Assessment Pain Assessment: No/denies pain    Home Living                          Prior Function            PT Goals (current goals can now be found in the care plan section) Progress towards PT goals: Progressing toward goals    Frequency    Min 1X/week      PT Plan Current plan remains appropriate    Co-evaluation              AM-PAC PT "6 Clicks" Mobility  Outcome Measure  Help needed turning from your back to your side while in a flat bed without using bedrails?: A Little Help needed moving from lying on your back to sitting on the side of a flat bed without using bedrails?: A Little Help needed moving to and from a bed to a chair (including a wheelchair)?: A Little Help needed standing up from a chair using your arms (e.g., wheelchair or bedside chair)?: A Little Help needed to walk in hospital room?: A Little Help needed climbing 3-5 steps with a railing? : A Lot 6 Click Score: 17    End of Session Equipment Utilized During Treatment: Gait belt Activity Tolerance: Patient tolerated treatment well Patient left: in bed;with call bell/phone within reach;with bed alarm set   PT Visit Diagnosis: Difficulty in walking,  not elsewhere classified (R26.2);Muscle weakness (generalized) (M62.81)     Time: 7253-6644 PT Time Calculation (min) (ACUTE ONLY): 25 min  Charges:    $Gait Training: 23-37 mins PT General Charges $$ ACUTE PT VISIT: 1 Visit                         Faye Ramsay, PT Acute Rehabilitation  Office: [phone]

## 2023-01-15 NOTE — Evaluation (Signed)
Occupational Therapy Evaluation Patient Details Name: Calvin [name] [name]: [id number] DOB: [birth year] Today's Date: 01/15/2023   History of Present Illness 77 y.o. male admitted 01/07/23 with sepsis, UTI associated with suprapubic catheter, urethral bleeding. PMH significant for HTN, HLD, ischemic stroke 12/23, BPH, neurogenic bladder, suprapubic catheter in place, anxiety/depression.   Clinical Impression   The pt is currently presenting with the below listed deficits (see OT problem list), which compromises his ADL performance and overall functional independence. He requires increased assist for lower body dressing tasks, min guard assist to stand using  RW, and set-up assist for grooming in sitting; he initially wanted to perform grooming standing at the sink, however reported feelings of fatigue with activity, and subsequently requested to sit to perform tasks. He expressed an interest in receiving more therapy services in the hospital setting, to help him get stronger and to maximize his functional independence. Home health therapy services are recommended, upon his hospital discharge.       Recommendations for follow up therapy are one component of a multi-disciplinary discharge planning process, led by the attending physician.  Recommendations may be updated based on patient status, additional functional criteria and insurance authorization.   Assistance Recommended at Discharge Intermittent Supervision/Assistance  Patient can return home with the following Assist for transportation;Help with stairs or ramp for entrance;Assistance with cooking/housework;A little help with bathing/dressing/bathroom;A little help with walking and/or transfers    Functional Status Assessment  Patient has had a recent decline in their functional status and demonstrates the ability to make significant improvements in function in a reasonable and predictable amount of time.  Equipment Recommendations  None  recommended by OT    Recommendations for Other Services       Precautions / Restrictions Precautions Precautions: Fall Restrictions Weight Bearing Restrictions: No      Mobility Bed Mobility Overal bed mobility: Needs Assistance Bed Mobility: Supine to Sit Rolling: Supervision   Supine to sit: HOB elevated          Transfers Overall transfer level: Needs assistance Equipment used: Rolling walker (2 wheels) Transfers: Sit to/from Stand Sit to Stand: From elevated surface, Min guard                  Balance Overall balance assessment: Needs assistance     Sitting balance - Comments: static sitting-good. dynamic sitting-fair+       Standing balance comment: min guard to min assist with RW            ADL either performed or assessed with clinical judgement   ADL Overall ADL's : Needs assistance/impaired Eating/Feeding: Independent;Sitting Eating/Feeding Details (indicate cue type and reason): at chair level, based on clinical judgement Grooming: Set up;Sitting Grooming Details (indicate cue type and reason): He performed face washing and teeth brushing seated at the edge of the bed.         Upper Body Dressing : Minimal assistance;Standing Upper Body Dressing Details (indicate cue type and reason): He donned a hospital gown around his back. Lower Body Dressing: Maximal assistance Lower Body Dressing Details (indicate cue type and reason): He required significant assist to donn his socks seated EOB. Toilet Transfer: Minimal assistance;Ambulation;Rolling walker (2 wheels);Grab bars                   Vision Baseline Vision/History: 1 Wears glasses Additional Comments: he correctly read the time depicted on the wall clock            Pertinent Vitals/Pain Pain  Assessment Pain Assessment: No/denies pain     Hand Dominance Right   Extremity/Trunk Assessment Upper Extremity Assessment Upper Extremity Assessment: Overall WFL for tasks  assessed   Lower Extremity Assessment Lower Extremity Assessment: Generalized weakness       Communication Communication Communication: HOH   Cognition Arousal/Alertness: Awake/alert Behavior During Therapy: Flat affect Overall Cognitive Status: Difficult to assess            General Comments: Oriented to person, place, and year; disoriented to month. Able to follow 1 step commands without difficulty                Home Living Family/patient expects to be discharged to:: Private residence Living Arrangements: Alone   Type of Home: House Home Access: Stairs to enter [employer] of Steps: 6   Home Layout: Two level Alternate Level Stairs-Number of Steps: 2 story home with a main level and basement level   Bathroom Shower/Tub: Tub/shower unit         Home Equipment: Cane - single Librarian, academic (2 wheels);Wheelchair - manual   Additional Comments: vague reports given on living situation at times, however he indicates "people" rotate to check in with him daily; stated he can have 24/7 care in the home      Prior Functioning/Environment Prior Level of Function : Needs assist             Mobility Comments: Reports use of RW vs. manual wheelchair for mobility. ADLs Comments: Pt reported being modified independent with bathing, dressing, and toileting. States he has in-home caregivers and friends who perform cooking and cleaning.        OT Problem List: Decreased strength;Decreased activity tolerance;Impaired balance (sitting and/or standing);Decreased knowledge of use of DME or AE      OT Treatment/Interventions: Self-care/ADL training;Therapeutic exercise;Energy conservation;DME and/or AE instruction;Therapeutic activities;Balance training;Patient/family education    OT Goals(Current goals can be found in the care plan section) Acute Rehab OT Goals Patient Stated Goal: to receive more therapy so he can get stronger OT Goal Formulation:  With patient Time For Goal Achievement: 01/29/23 Potential to Achieve Goals: Good ADL Goals Pt Will Perform Grooming: with modified independence;standing Pt Will Perform Lower Body Dressing: with modified independence;sit to/from stand Pt Will Transfer to Toilet: with modified independence;ambulating;grab bars Pt Will Perform Toileting - Clothing Manipulation and hygiene: with modified independence;sit to/from stand  OT Frequency: Min 1X/week       AM-PAC OT "6 Clicks" Daily Activity     Outcome Measure Help from another person eating meals?: None Help from another person taking care of personal grooming?: A Little Help from another person toileting, which includes using toliet, bedpan, or urinal?: A Little Help from another person bathing (including washing, rinsing, drying)?: A Lot Help from another person to put on and taking off regular upper body clothing?: A Little Help from another person to put on and taking off regular lower body clothing?: A Lot 6 Click Score: 17   End of Session Equipment Utilized During Treatment: Gait belt;Rolling walker (2 wheels) Nurse Communication: Mobility status  Activity Tolerance: Patient tolerated treatment well Patient left: in chair;with call bell/phone within reach  OT Visit Diagnosis: Unsteadiness on feet (R26.81);Muscle weakness (generalized) (M62.81)                Time: 5409-8119 OT Time Calculation (min): 34 min Charges:  OT General Charges $OT Visit: 1 Visit OT Evaluation $OT Eval Low Complexity: 1 Low OT Treatments $Self Care/Home Management :  8-22 mins    Reuben Likes, OTR/L 01/15/2023, 11:01 AM

## 2023-01-16 ENCOUNTER — Inpatient Hospital Stay (HOSPITAL_COMMUNITY): Payer: Medicare HMO

## 2023-01-16 DIAGNOSIS — N390 Urinary tract infection, site not specified: Secondary | ICD-10-CM | POA: Diagnosis not present

## 2023-01-16 DIAGNOSIS — T83511A Infection and inflammatory reaction due to indwelling urethral catheter, initial encounter: Secondary | ICD-10-CM | POA: Diagnosis not present

## 2023-01-16 HISTORY — PX: IR US GUIDE VASC ACCESS LEFT: IMG2389

## 2023-01-16 HISTORY — PX: IR 3D INDEPENDENT WKST: IMG2385

## 2023-01-16 HISTORY — PX: IR ANGIOGRAM SELECTIVE EACH ADDITIONAL VESSEL: IMG667

## 2023-01-16 HISTORY — PX: IR EMBO TUMOR ORGAN ISCHEMIA INFARCT INC GUIDE ROADMAPPING: IMG5449

## 2023-01-16 HISTORY — PX: IR ANGIOGRAM PELVIS SELECTIVE OR SUPRASELECTIVE: IMG661

## 2023-01-16 MED ORDER — LIDOCAINE HCL (PF) 1 % IJ SOLN
INTRAMUSCULAR | Status: AC
  Filled 2023-01-16: qty 30

## 2023-01-16 MED ORDER — FENTANYL CITRATE (PF) 100 MCG/2ML IJ SOLN
INTRAMUSCULAR | Status: AC | PRN
  Administered 2023-01-16 (×2): 50 ug via INTRAVENOUS

## 2023-01-16 MED ORDER — FENTANYL CITRATE (PF) 100 MCG/2ML IJ SOLN
INTRAMUSCULAR | Status: AC | PRN
Start: 2023-01-16 — End: 2023-01-16
  Administered 2023-01-16: 25 ug via INTRAVENOUS

## 2023-01-16 MED ORDER — VERAPAMIL HCL 2.5 MG/ML IV SOLN
INTRAVENOUS | Status: AC
  Filled 2023-01-16: qty 2

## 2023-01-16 MED ORDER — IOHEXOL 300 MG/ML  SOLN: 100.0000 mL | Freq: Once | INTRAMUSCULAR | Status: DC | PRN

## 2023-01-16 MED ORDER — CIPROFLOXACIN IN D5W 200 MG/100ML IV SOLN
200.0000 mg | INTRAVENOUS | Status: AC
  Administered 2023-01-16: 200 mg via INTRAVENOUS
  Filled 2023-01-16: qty 100

## 2023-01-16 MED ORDER — FENTANYL CITRATE (PF) 100 MCG/2ML IJ SOLN
INTRAMUSCULAR | Status: AC
  Filled 2023-01-16: qty 2

## 2023-01-16 MED ORDER — MIDAZOLAM HCL 2 MG/2ML IJ SOLN
INTRAMUSCULAR | Status: AC | PRN
  Administered 2023-01-16: .5 mg via INTRAVENOUS

## 2023-01-16 MED ORDER — LIDOCAINE-PRILOCAINE 2.5-2.5 % EX CREA
TOPICAL_CREAM | CUTANEOUS | Status: AC
  Administered 2023-01-16: 1 via TOPICAL
  Filled 2023-01-16: qty 5

## 2023-01-16 MED ORDER — NITROGLYCERIN IN D5W 100-5 MCG/ML-% IV SOLN
INTRAVENOUS | Status: AC
  Filled 2023-01-16: qty 250

## 2023-01-16 MED ORDER — VERAPAMIL HCL 2.5 MG/ML IV SOLN
INTRA_ARTERIAL | Status: AC | PRN
  Administered 2023-01-16: 6 mL via INTRA_ARTERIAL

## 2023-01-16 MED ORDER — SODIUM CHLORIDE 0.9 % IV SOLN
INTRAVENOUS | Status: AC | PRN
  Administered 2023-01-16: 10 mL/h via INTRAVENOUS

## 2023-01-16 MED ORDER — NITROGLYCERIN 2 % TD OINT
0.5000 [in_us] | TOPICAL_OINTMENT | Freq: Once | TRANSDERMAL | Status: DC
  Filled 2023-01-16: qty 30

## 2023-01-16 MED ORDER — NITROGLYCERIN 2 % TD OINT
0.5000 [in_us] | TOPICAL_OINTMENT | Freq: Once | TRANSDERMAL | Status: AC
  Administered 2023-01-16: 0.5 [in_us] via TOPICAL
  Filled 2023-01-16: qty 0.5

## 2023-01-16 MED ORDER — LIDOCAINE HCL (PF) 1 % IJ SOLN
20.0000 mL | Freq: Once | INTRAMUSCULAR | Status: AC
  Administered 2023-01-16: 1 mL via INTRADERMAL
  Filled 2023-01-16: qty 20

## 2023-01-16 MED ORDER — MIDAZOLAM HCL 2 MG/2ML IJ SOLN
INTRAMUSCULAR | Status: AC | PRN
  Administered 2023-01-16 (×2): 1 mg via INTRAVENOUS

## 2023-01-16 MED ORDER — MIDAZOLAM HCL 2 MG/2ML IJ SOLN
INTRAMUSCULAR | Status: AC
  Filled 2023-01-16: qty 4

## 2023-01-16 MED ORDER — HEPARIN SODIUM (PORCINE) 1000 UNIT/ML IJ SOLN
INTRAMUSCULAR | Status: AC
  Filled 2023-01-16: qty 10

## 2023-01-16 MED ORDER — FENTANYL CITRATE (PF) 100 MCG/2ML IJ SOLN
INTRAMUSCULAR | Status: AC | PRN
  Administered 2023-01-16: 25 ug via INTRAVENOUS

## 2023-01-16 MED ORDER — PREDNISONE 20 MG PO TABS
20.0000 mg | ORAL_TABLET | ORAL | Status: AC
  Administered 2023-01-16: 20 mg via ORAL
  Filled 2023-01-16 (×2): qty 1

## 2023-01-16 NOTE — Progress Notes (Signed)
PROGRESS NOTE  Calvin [name]  DOB: 27-Jul-1945  PCP: [name], MD GYI:[id number]  DOA: 01/07/2023  LOS: 8 days  Hospital Day: 10  Brief narrative: Calvin [name] is a 77 y.o. male with PMH significant for HTN, HLD, ischemic stroke 12/23, BPH, neurogenic bladder, suprapubic catheter in place, anxiety/depression. Recently admitted 7/20-7/21 due to obstruction of his suprapubic catheter for which he was evaluated by urology and found to have encrusted suprapubic tube which was exchanged.  He was also found to have new onset A-fib with RVR and was discharged on oral Cardizem and Xarelto. Next day, 7/22, patient was brought to the ED with a fever 100.9, lethargy and redness and malodor noted at the site of suprapubic catheter insertion.  In the ED, he was afebrile but tachycardic, tachypneic, he was noted to have a low O2 sat of 89% on room air, placed on 2 L oxygen nasal cannula. Labs showed WBC 13.1, sodium 130, lactate normal x 2, blood cultures drawn.   UA with positive nitrite, moderate leukocytes, and microscopy showing 21-50 RBCs, 21-50 WBCs, and many bacteria.  Urine culture pending.  Chest x-ray showed cardiomegaly, vascular congestion CTA chest negative for PE and showing mild bibasilar atelectatic changes.   ED physician consulted urology (Dr. Liliane Shi) who recommended starting antibiotics to treat for UTI and will consult in the morning.   Patient was started on IV ceftriaxone.  Subjective: Patient was seen and examined this morning. Lying on bed.  Not in distress.  Was n.p.o. for procedure regarding ambulation today.  Assessment and plan: UTI associated with suprapubic catheter Sepsis - POA Neurogenic bladder s/p suprapubic catheter  BPH Reported fever at home Noted to have tachycardia, tachypnea, lethargy, UTI, and malodorous suprapubic catheter insertion site Blood cultures no growth so far.  Urine culture showed multiple species present Completed 7-day course of IV  Rocephin.  Urethral bleeding Started on 7/25.  No trauma. Per urology note, bleeding is likely from prostatic urethra where he has an area of necrotic tissue post resection.  A large bore ureteral catheter was placed in order to tamponade the bleeding with outside of intervention. Xarelto has been stopped for last few days. Urology requested IR for prostate embolization.  Tentatively planned for today. Continue Proscar, Flomax Recent Labs  Lab 01/12/23 0506 01/13/23 0429 01/14/23 0411 01/15/23 0407 01/16/23 0429  WBC 11.8* 14.8* 18.3* 10.7* 10.7*   Acute on chronic diastolic CHF Essential hypertension Daughter reports shortness of breath on exertion worsening lately. Chest x-ray on admission showed cardiomegaly and vascular congestion. BNP was elevated to 227 Most recent echo done in December 2023 was showing EF 60 to 65% and diastolic function could not be evaluated.  Patient had acute episode of shortness of breath on 7/29.  Chest x-ray showed cardiomegaly with mild, diffuse bilateral interstitial pulm opacities.  I started on Lasix IV 40 mg daily.  Breathing much better.  Not on supplemental oxygen.  Continue IV Lasix for now.   Continue Mucinex. Net IO Since Admission: -18,101.42 mL [01/16/23 1441] Continue to monitor for daily intake output, weight, blood pressure, BNP, renal function and electrolytes. Recent Labs  Lab 01/12/23 0506 01/13/23 0429 01/14/23 0411 01/15/23 0407 01/16/23 0429  BUN 15 15 12 15 20   CREATININE 0.76 0.71 0.75 0.88 0.90  NA 135 130* 134* 136 135  K 4.1 3.9 3.7 3.6 3.6   Paroxysmal A-fib Continue Cardizem.  Metoprolol as needed Xarelto on hold due to urethral bleeding.   Hyperlipidemia Continue  Lipitor   Mild hyponatremia Continue gentle IV fluid hydration with normal saline Recent Labs  Lab 01/10/23 0359 01/11/23 0357 01/12/23 0506 01/13/23 0429 01/14/23 0411 01/15/23 0407 01/16/23 0429  NA 135 132* 135 130* 134* 136 135   Mild  hypokalemia Potassium level low this morning at 3.2.  Replacement given. Recent Labs  Lab 01/12/23 0506 01/13/23 0429 01/14/23 0411 01/15/23 0407 01/16/23 0429  K 4.1 3.9 3.7 3.6 3.6   Anxiety/depression Not on meds  Diarrhea/constipation Diarrhea improved after he was started on Banatrol and Imodium as needed.  Imodium has been stopped.  Okay to continue Banatrol  Impaired mobility PT eval obtained.  Home Health PT versus SNF.   Mobility: PT eval  Goals of care   Code Status: Full Code     DVT prophylaxis:  SCDs Start: 01/08/23 0506   Antimicrobials: Completed course of IV Rocephin Fluid: None currently Consultants: Urology Family Communication: None at bedside.  Discussed with his daughter Erie Noe yesterday 7/30.  Status: Inpatient Level of care:  Progressive   Patient from: Home Anticipated d/c to: Pending clinical course Needs to continue in-hospital care:  Pending prostatic artery ambulation today   Diet:  Diet Order             Diet NPO time specified Except for: Sips with Meds  Diet effective midnight                   Scheduled Meds:  benzonatate  100 mg Oral TID   dextromethorphan-guaiFENesin  1 tablet Oral BID   diltiazem  180 mg Oral Daily   fiber supplement (BANATROL TF)  60 mL Oral BID   finasteride  5 mg Oral Daily   furosemide  40 mg Intravenous Daily    PRN meds: sodium chloride, acetaminophen **OR** acetaminophen, fentaNYL, fentaNYL, fentaNYL, iohexol, iohexol, levalbuterol, metoprolol tartrate, midazolam, midazolam, midazolam, midazolam, ondansetron (ZOFRAN) IV, mouth rinse, Radial Cocktail (nitroglycerin/verapamil/heparin) for IR   Infusions:   sodium chloride 10 mL/hr (01/16/23 1110)   cefTRIAXone (ROCEPHIN)  IV 1 g (01/15/23 1834)    Antimicrobials: Anti-infectives (From admission, onward)    Start     Dose/Rate Route Frequency Ordered Stop   01/16/23 1000  ciprofloxacin (CIPRO) IVPB 200 mg        200 mg 100 mL/hr  over 60 Minutes Intravenous On call 01/16/23 0902 01/16/23 1329   01/08/23 1900  cefTRIAXone (ROCEPHIN) 1 g in sodium chloride 0.9 % 100 mL IVPB        1 g 200 mL/hr over 30 Minutes Intravenous Every 24 hours 01/08/23 0507 01/16/23 2359   01/07/23 1945  cefTRIAXone (ROCEPHIN) 1 g in sodium chloride 0.9 % 100 mL IVPB        1 g 200 mL/hr over 30 Minutes Intravenous  Once 01/07/23 1939 01/07/23 2015       Nutritional status:  Body mass index is 34.18 kg/m.          Objective: Vitals:   01/16/23 1420 01/16/23 1423  BP: 100/81 118/83  Pulse: 71 73  Resp: 11 15  Temp:    SpO2: 95% 95%    Intake/Output Summary (Last 24 hours) at 01/16/2023 1441 Last data filed at 01/16/2023 1345 Gross per 24 hour  Intake 0 ml  Output 3450 ml  Net -3450 ml   Filed Weights   01/07/23 2016  Weight: 114.3 kg   Weight change:  Body mass index is 34.18 kg/m.   Physical Exam: General exam: Pleasant, elderly  Caucasian male.  Not in distress. Skin: No rashes, lesions or ulcers. HEENT: Atraumatic, normocephalic, no obvious bleeding Lungs: Clear to auscultation bilaterally.  No crackles or wheezing. CVS: Regular rate and rhythm, no murmur GI/Abd: soft, nondistended, bowel sound present, suprapubic catheter site with granulation tissue and crusts.  Urethral bleeding improving but continues.   CNS: Alert, awake, hard of hearing but oriented x 3 otherwise  psychiatry: Mood appropriate Extremities: No pedal edema, no calf tenderness  Data Review: I have personally reviewed the laboratory data and studies available.  F/u labs ordered Unresulted Labs (From admission, onward)     Start     Ordered   01/17/23 0500  CBC with Differential/Platelet  Tomorrow morning,   R       Question:  Specimen collection method  Answer:  Lab=Lab collect   01/16/23 1441   01/17/23 0500  Basic metabolic panel  Tomorrow morning,   R       Question:  Specimen collection method  Answer:  Lab=Lab collect   01/16/23  1441            Total time spent in review of labs and imaging, patient evaluation, formulation of plan, documentation and communication with family: 25 minutes  Signed, Lorin Glass, MD [facility] 01/16/2023

## 2023-01-16 NOTE — Plan of Care (Signed)

## 2023-01-16 NOTE — Plan of Care (Signed)
  Problem: Health Behavior/Discharge Planning: Goal: Ability to manage health-related needs will improve Outcome: Progressing   Problem: Clinical Measurements: Goal: Respiratory complications will improve Outcome: Progressing   Problem: Activity: Goal: Risk for activity intolerance will decrease Outcome: Progressing   Problem: Nutrition: Goal: Adequate nutrition will be maintained Outcome: Progressing   Problem: Coping: Goal: Level of anxiety will decrease Outcome: Progressing   Problem: Elimination: Goal: Will not experience complications related to urinary retention Outcome: Progressing   Problem: Pain Managment: Goal: General experience of comfort will improve Outcome: Progressing

## 2023-01-16 NOTE — Progress Notes (Signed)
Occupational Therapy Treatment Patient Details Name: Calvin [name] [name]: [id number] DOB: 11-20-1945 Today's Date: 01/16/2023   History of present illness 77 y.o. male admitted 01/07/23 with sepsis, UTI associated with suprapubic catheter, urethral bleeding. PMH significant for HTN, HLD, ischemic stroke 12/23, BPH, neurogenic bladder, suprapubic catheter in place, anxiety/depression.   OT comments  Pt was seen for bed level intervention(s), as he gently deferred out of bed activity, stating he was recently given medication that causes increased lethargy. He performed supine to long-sitting in bed with supervision. While in long-sitting, he performed upper body grooming with set-up assist. He was further educated on simple bed level exercises for strengthening needed to facilitate progressive ADL performance; he demonstrated appropriate teach back with supervision. Continue OT plan of care.    Recommendations for follow up therapy are one component of a multi-disciplinary discharge planning process, led by the attending physician.  Recommendations may be updated based on patient status, additional functional criteria and insurance authorization.    Assistance Recommended at Discharge Intermittent Supervision/Assistance  Patient can return home with the following  Assist for transportation;Help with stairs or ramp for entrance;Assistance with cooking/housework;A little help with bathing/dressing/bathroom;A little help with walking and/or transfers   Equipment Recommendations  None recommended by OT    Recommendations for Other Services      Precautions / Restrictions Precautions Precautions: Fall Restrictions Weight Bearing Restrictions: No       Mobility Bed Mobility         Supine to sit: Supervision     General bed mobility comments: He performed supine to long-sitting in bed with SBA. Required use of bed rails to achieve.    Transfers                   General  transfer comment: pt gently deferred out of bed, reporting he was recently given medication that causes lethargy         ADL either performed or assessed with clinical judgement   ADL Overall ADL's : Needs assistance/impaired Eating/Feeding: Independent;Bed level   Grooming: Set up;Bed level Grooming Details (indicate cue type and reason): He performed face washing long-sitting in bed, as well as putting in his contact lenses.         Upper Body Dressing : Minimal assistance;Bed level Upper Body Dressing Details (indicate cue type and reason): simulated                         Cognition Arousal/Alertness: Awake/alert Behavior During Therapy: WFL for tasks assessed/performed Overall Cognitive Status: Within Functional Limits for tasks assessed                      Pertinent Vitals/ Pain       Pain Assessment Pain Assessment: No/denies pain         Frequency  Min 1X/week        Progress Toward Goals  OT Goals(current goals can now be found in the care plan section)     Acute Rehab OT Goals OT Goal Formulation: With patient Time For Goal Achievement: 01/29/23 Potential to Achieve Goals: Good  Plan Discharge plan remains appropriate       AM-PAC OT "6 Clicks" Daily Activity     Outcome Measure   Help from another person eating meals?: None Help from another person taking care of personal grooming?: A Little Help from another person toileting, which includes using toliet, bedpan, or urinal?: A Little  Help from another person bathing (including washing, rinsing, drying)?: A Lot Help from another person to put on and taking off regular upper body clothing?: A Little Help from another person to put on and taking off regular lower body clothing?: A Lot 6 Click Score: 17    End of Session Equipment Utilized During Treatment: Other (comment)  OT Visit Diagnosis: Unsteadiness on feet (R26.81);Muscle weakness (generalized) (M62.81)   Activity  Tolerance Patient tolerated treatment well   Patient Left in bed;with call bell/phone within reach;with bed alarm set   Nurse Communication Mobility status        Time: 1720-1736 OT Time Calculation (min): 16 min  Charges: OT General Charges $OT Visit: 1 Visit OT Treatments $Therapeutic Activity: 8-22 mins     Reuben Likes, OTR/L 01/16/2023, 5:44 PM

## 2023-01-16 NOTE — Sedation Documentation (Signed)
Patient's IV noted to be leaking after administering first dose of Versed and Fentanyl at 1240 and flushing with NS. Tape and Tegaderm removed. IV catheter noted to be partway out of insertion site and kinked. Site leaking. IV removed. Patient alert and states he does not feel sedated at all.

## 2023-01-16 NOTE — Progress Notes (Signed)
Subjective: NAEON. Pt is expresses frustration with daily NPO status.  Objective: Vital signs in last 24 hours: Temp:  [97.7 F (36.5 C)-98 F (36.7 C)] 97.7 F (36.5 C) (07/31 0423) Pulse Rate:  [77-87] 80 (07/31 0423) Resp:  [17-19] 19 (07/31 0423) BP: (111-154)/(81-98) 154/93 (07/31 0423) SpO2:  [96 %-97 %] 96 % (07/31 0423)  Intake/Output from previous day: 07/30 0701 - 07/31 0700 In: 720 [P.O.:720] Out: 2300 [Urine:2300]  Intake/Output this shift: No intake/output data recorded.  Physical Exam:  General: Alert and oriented CV: No cyanosis Lungs: equal chest rise Abdomen: Soft, NTND, no rebound or guarding Gu: SPT in place. Clear fluid irrigated from bladder. 47f foley in penile urethra, capped  Lab Results: Recent Labs    01/14/23 0411 01/15/23 0407 01/16/23 0429  HGB 12.5* 11.6* 11.7*  HCT 36.4* 34.5* 34.5*   BMET Recent Labs    01/15/23 0407 01/16/23 0429  NA 136 135  K 3.6 3.6  CL 100 98  CO2 24 25  GLUCOSE 120* 116*  BUN 15 20  CREATININE 0.88 0.90  CALCIUM 9.2 9.1     Studies/Results: CT Angio Abd/Pel w/ and/or w/o  Result Date: 01/14/2023 CLINICAL DATA:  77 year old male with history of prostatic hematuria. EXAM: CT ANGIOGRAPHY ABDOMEN AND PELVIS WITH CONTRAST AND WITHOUT CONTRAST TECHNIQUE: Multidetector CT imaging of the abdomen and pelvis was performed using the standard protocol during bolus administration of intravenous contrast. Multiplanar reconstructed images and MIPs were obtained and reviewed to evaluate the vascular anatomy. RADIATION DOSE REDUCTION: This exam was performed according to the departmental dose-optimization program which includes automated exposure control, adjustment of the mA and/or kV according to patient size and/or use of iterative reconstruction technique. CONTRAST:  OMNIPAQUE IOHEXOL 300 MG/ML  SOLN COMPARISON:  06/21/2022, 01/07/2023 FINDINGS: VASCULAR Aorta: Normal caliber aorta without aneurysm,  dissection, vasculitis or significant stenosis. Scattered atherosclerotic calcifications. Celiac: Patent without evidence of aneurysm, dissection, vasculitis or significant stenosis. SMA: Patent without evidence of aneurysm, dissection, vasculitis or significant stenosis. Renals: Single bilateral renal arteries are patent without evidence of aneurysm, dissection, vasculitis, fibromuscular dysplasia or significant stenosis. IMA: Patent without evidence of aneurysm, dissection, vasculitis or significant stenosis. Inflow: Patent without evidence of aneurysm, dissection, vasculitis or significant stenosis. Proximal Outflow: Bilateral common femoral and visualized portions of the superficial and profunda femoral arteries are patent without evidence of aneurysm, dissection, vasculitis or significant stenosis. Bilateral Yamaki Group A internal iliac artery branch types. The right prostatic artery arises from the proximal internal pudendal artery. The left prostatic artery arises from the proximal obturator artery. Veins: The hepatic veins are widely patent. The portal system is widely patent and normal in caliber. The renal veins are patent bilaterally in standard anatomic configuration. No evidence of iliocaval thrombosis or anomaly. Review of the MIP images confirms the above findings. NON-VASCULAR Lower chest: Bibasilar subsegmental atelectasis, slightly progressed from comparison. Hepatobiliary: No new focal liver abnormality is seen. Similar appearing multifocal bilobar simple hepatic cysts, the largest in the left lobe measuring up to 2.6 cm. No gallstones, gallbladder wall thickening, or biliary dilatation. Pancreas: Unremarkable. No pancreatic ductal dilatation or surrounding inflammatory changes. Spleen: Normal in size without focal abnormality. Adrenals/Urinary Tract: Adrenal glands are unremarkable. Unchanged scattered simple renal cysts, none requiring additional follow-up. Kidneys are normal, without renal  calculi, focal lesion, or hydronephrosis. Balloon retention suprapubic and trans urethral Foley type catheter is in place. The bladder is completely decompressed. Stomach/Bowel: Stomach is within normal limits. Appendix appears  surgically absent. No evidence of bowel wall thickening, distention, or inflammatory changes. Lymphatic: No abdominopelvic lymphadenopathy. Reproductive: Prostate gland is enlarged measuring proximally 6.5 x 5.9 x 9.9 cm with an estimated volume of 199 g. Other: Small fat containing inguinal hernias bilaterally. No abdominopelvic collections. Musculoskeletal: No acute osseous abnormality. Mild multilevel degenerative changes of the visualized thoracolumbar spine. IMPRESSION: VASCULAR 1. No acute vascular abnormality in the abdomen or pelvis. 2. [place] type IV right prostatic artery and type II left prostatic artery. Bilateral Yamaki Group A internal iliac artery branching configuration. 3.  Aortic Atherosclerosis (ICD10-I70.0). NON-VASCULAR 1. Slightly worsened bibasilar subsegmental atelectasis. 2. Prostatomegaly with estimated volume of 199 g. 3. Decompressed bladder with indwelling suprapubic and Foley catheters. Marliss Coots, MD [facility] Electronically Signed   By: Marliss Coots M.D.   On: 01/14/2023 15:19   DG Chest 2 View  Result Date: 01/14/2023 CLINICAL DATA:  Dyspnea EXAM: CHEST - 2 VIEW COMPARISON:  01/07/2023 FINDINGS: Cardiomegaly. Mild, diffuse bilateral interstitial pulmonary opacity. Disc degenerative disease of the thoracic spine. IMPRESSION: Cardiomegaly with mild, diffuse bilateral interstitial pulmonary opacity, consistent with edema or atypical/viral infection. No focal airspace opacity. Electronically Signed   By: Jearld Lesch M.D.   On: 01/14/2023 15:19    Assessment/Plan: #SPT Healing appropriately.  There is a little bit of fibrinous exudate and catheter is frequently pinned between his abdomen  and pubis, causing some minor erosion on the lateral ends of the incision.  Urine remains clear  # Urethral bleeding- Resolved Necrotic area of prostatic urethra was resected.  It appears that during coughing fit patient has bled heavily from this area.  HgB stable. Daily labs His Xarelto is on hold.  Bleeding has resolved, but considering Xarelto will need to be restarted, and he was over two weeks post op when he bled, we will still request prostate artery embolization. Pt scheduled today for 11:30. Urethral foley will stay in place until the day following PAE. Foley then be removed, Xarelto restarted at discretion of primary, and pt can be discharged from a Urologic perspective.      LOS: 8 days   Elmon Kirschner, NP [facility] Pager: [phone]  01/16/2023, 8:24 AM

## 2023-01-16 NOTE — Procedures (Signed)
Interventional Radiology Procedure Note  Procedure:  1) Bilateral prostate artery embolization 2) Coil embolization of right inferior rectal artery  Findings: Please refer to procedural dictation for full description.  Bilateral prostate artery embolization with 400 micron embospheres.  Additional coil embolization of left prostatic artery.  Coil embolization of right inferior rectal artery arising from mid right prostatic artery for particle embolic/reflux protection.  Left radial artery access with TR band applied at 14:30 with 10 cc air.  Complications: None immediate  Estimated Blood Loss: <5 mL  Recommendations: TR band release protocol. Recommend 24-48 hrs before attempting Foley removal. IR will follow.   Marliss Coots, MD

## 2023-01-17 DIAGNOSIS — I4891 Unspecified atrial fibrillation: Secondary | ICD-10-CM | POA: Diagnosis not present

## 2023-01-17 DIAGNOSIS — T83511A Infection and inflammatory reaction due to indwelling urethral catheter, initial encounter: Secondary | ICD-10-CM | POA: Diagnosis not present

## 2023-01-17 DIAGNOSIS — N390 Urinary tract infection, site not specified: Secondary | ICD-10-CM | POA: Diagnosis not present

## 2023-01-17 MED ORDER — FUROSEMIDE 20 MG PO TABS
20.0000 mg | ORAL_TABLET | Freq: Every day | ORAL | Status: DC
  Administered 2023-01-18 – 2023-01-19 (×2): 20 mg via ORAL
  Filled 2023-01-17 (×2): qty 1

## 2023-01-17 MED ORDER — CHLORHEXIDINE GLUCONATE CLOTH 2 % EX PADS
6.0000 | MEDICATED_PAD | Freq: Every day | CUTANEOUS | Status: DC
  Administered 2023-01-17 – 2023-01-19 (×3): 6 via TOPICAL

## 2023-01-17 NOTE — Progress Notes (Signed)
Subjective: NAEON. Pt is expresses frustration with daily NPO status.  Objective: Vital signs in last 24 hours: Temp:  [97.6 F (36.4 C)-98.2 F (36.8 C)] 98.2 F (36.8 C) (08/01 0320) Pulse Rate:  [60-91] 91 (08/01 0320) Resp:  [11-25] 20 (08/01 0320) BP: (100-174)/(71-118) 150/118 (08/01 0600) SpO2:  [92 %-97 %] 96 % (08/01 0320)  Intake/Output from previous day: 07/31 0701 - 08/01 0700 In: 300 [IV Piggyback:300] Out: 2850 [Urine:2850]  Intake/Output this shift: No intake/output data recorded.  Physical Exam:  General: Alert and oriented CV: No cyanosis Lungs: equal chest rise Abdomen: Soft, NTND, no rebound or guarding Gu: SPT in place. Clear fluid irrigated from bladder. 36f foley in penile urethra, capped  Lab Results: Recent Labs    01/15/23 0407 01/16/23 0429 01/17/23 0407  HGB 11.6* 11.7* 12.5*  HCT 34.5* 34.5* 36.8*   BMET Recent Labs    01/16/23 0429 01/17/23 0407  NA 135 133*  K 3.6 3.6  CL 98 99  CO2 25 24  GLUCOSE 116* 122*  BUN 20 22  CREATININE 0.90 0.93  CALCIUM 9.1 9.3     Studies/Results: IR EMBO TUMOR ORGAN ISCHEMIA INFARCT INC GUIDE ROADMAPPING  Result Date: 01/17/2023 INDICATION: 77 year old male with history of prostatic hematuria, currently admitted as an inpatient. EXAM: 1. Ultrasound-guided vascular access of the left radial artery. 2. Selective catheterization and angiography of the right internal iliac, right internal pudendal, right inferior rectal, right prostatic, left common iliac, left internal iliac, left obturator, and left prostatic arteries. 3. Cone beam CT. 4. Coil embolization of right inferior rectal artery. 5. Right prostatic artery embolization. 6. Left prostatic artery embolization. MEDICATIONS: 200 mg ciprofloxacin, intravenous ANESTHESIA/SEDATION: Moderate (conscious) sedation was employed during this procedure. A total of Versed 2.5 mg and Fentanyl 100 mcg was administered intravenously. Moderate Sedation  Time: 103 minutes. The patient's level of consciousness and vital signs were monitored continuously by radiology nursing throughout the procedure under my direct supervision. CONTRAST:  20 mL Omnipaque 300 FLUOROSCOPY: Radiation Exposure Index (as provided by the fluoroscopic device): 2,819 mGy Kerma COMPLICATIONS: None immediate. PROCEDURE: Informed consent was obtained from the patient following explanation of the procedure, risks, benefits and alternatives. The patient understands, agrees and consents for the procedure. All questions were addressed. A time out was performed prior to the initiation of the procedure. Maximal barrier sterile technique utilized including caps, mask, sterile gowns, sterile gloves, large sterile drape, hand hygiene, and Betadine prep. The left wrist was prepped and draped in standard fashion. Pulse oximeter was attached to the left thumb. Tora Perches test was performed, grade A. The left radial artery measured 0.4 cm in diameter. Subdermal Local anesthesia was provided at the planned needle entry site with 1% lidocaine. A small skin nick was made. Under direct ultrasound visualization, the left radial artery was punctured with a 21 gauge micropuncture needle. A permanent image was captured and stored in the record. A microwire was placed and exchanged for a 4/5 French slender sheath. The sheath was flushed followed by installation of standard radial cocktail. A 5 French MG2 glide catheter and Bentson wire were directed under fluoroscopic visualization through the left upper extremity, around the aortic arch, through the descending thoracic aorta and into the abdominal aorta. The catheter was then directed into the right internal iliac artery. Right internal iliac artery angiogram demonstrated patency of the anterior and posterior divisions with the prostatic artery appearing to arise from the internal pudendal artery. A 1.9 [place] Progreat lambda  microcatheter and standard 0.014 inch air  Stahl wire within directed into the right internal pudendal artery. Angiogram was performed which demonstrated patency and origination of the right prostatic artery. The right prostatic artery was then selected and right prostatic artery angiogram was performed. Cone beam CT was then performed which demonstrated a proximal inferior rectal branch about the prostatic artery. There are no additional evidence of nontarget branch vessels. The inferior rectal artery was then selected and angiogram was performed which demonstrated no evidence of nontarget branch vessels. Next, proximal embolization of the right inferior rectal artery was performed with a single 1 mm x 5 mm low profile Ruby detachable coil. The catheter was retracted into the parent vessel and injection of contrast demonstrated adequate embolization and preserved patency of the right prostatic artery. The microcatheter was then directed deeper into the distal prostatic artery and particle embolization with dilute 400 micron hydro pleural see years was performed until stasis was achieved. The catheter was then retracted into the proximal prostatic artery and completion arteriogram demonstrated adequate embolization of the prostatic parenchyma. The microcatheter was then removed. The 5 French base catheter was then directed under fluoroscopic guidance to the left internal iliac artery. Angiogram was performed which demonstrated patency of the posterior and anterior divisions with the prostatic artery arising from the mid obturator artery. The obturator artery was then selected with the straight lambda Progreat microcatheter and Aristotle 14 microwire. Angiogram was performed in different obliquities to display the origin of the prostatic artery. There were several unsuccessful attempts with cannulating the prostatic artery including use of a fathom 14 microwire. Therefore, the catheter was exchanged for a triple angle lambda microcatheter which was then able  to successfully select the prostatic artery origin with wireless technique. Angiogram was performed of the prostatic artery which demonstrated opacification of the prostate and no evidence of nontarget branch vessels. There was some spasm within the prostatic artery, therefore 200 mcg of nitroglycerin was administered intra-arterially. Next, particle embolization of the left hemi prostate was performed with 400 micron hydro pearls. Due to the spasm visualized on prior arteriogram, the proximal prostatic artery was embolized with several low-profile Ruby detachable microcoils to ensure complete embolization. The catheter was retracted into the obturator artery and completion angiogram was performed which demonstrated appropriate and complete embolization of the left prostatic artery. The catheters were removed. A TR band was placed about the left wrist and inflated than the indwelling slender sheath was removed successfully. The patient tolerated the procedure well was transferred back to the floor in good condition. IMPRESSION: 1. Technically successful particle embolization of the right prostatic artery. 2. Shared origin of the right prostatic artery and right inferior rectal artery necessitating coil embolization of the right inferior rectal artery for embolic protection purposes. 3. Technically successful particle and coil embolization of the left prostatic artery. Marliss Coots, MD [facility] Radiology Electronically Signed   By: Marliss Coots M.D.   On: 01/17/2023 07:28   IR US Guide Vasc Access Left  Result Date: 01/17/2023 INDICATION: 77 year old male with history of prostatic hematuria, currently admitted as an inpatient. EXAM: 1. Ultrasound-guided vascular access of the left radial artery. 2. Selective catheterization and angiography of the right internal iliac, right internal pudendal, right inferior rectal, right prostatic, left common iliac, left  internal iliac, left obturator, and left prostatic arteries. 3. Cone beam CT. 4. Coil embolization of right inferior rectal artery. 5. Right prostatic artery embolization. 6. Left prostatic artery embolization. MEDICATIONS:  200 mg ciprofloxacin, intravenous ANESTHESIA/SEDATION: Moderate (conscious) sedation was employed during this procedure. A total of Versed 2.5 mg and Fentanyl 100 mcg was administered intravenously. Moderate Sedation Time: 103 minutes. The patient's level of consciousness and vital signs were monitored continuously by radiology nursing throughout the procedure under my direct supervision. CONTRAST:  20 mL Omnipaque 300 FLUOROSCOPY: Radiation Exposure Index (as provided by the fluoroscopic device): 2,819 mGy Kerma COMPLICATIONS: None immediate. PROCEDURE: Informed consent was obtained from the patient following explanation of the procedure, risks, benefits and alternatives. The patient understands, agrees and consents for the procedure. All questions were addressed. A time out was performed prior to the initiation of the procedure. Maximal barrier sterile technique utilized including caps, mask, sterile gowns, sterile gloves, large sterile drape, hand hygiene, and Betadine prep. The left wrist was prepped and draped in standard fashion. Pulse oximeter was attached to the left thumb. Tora Perches test was performed, grade A. The left radial artery measured 0.4 cm in diameter. Subdermal Local anesthesia was provided at the planned needle entry site with 1% lidocaine. A small skin nick was made. Under direct ultrasound visualization, the left radial artery was punctured with a 21 gauge micropuncture needle. A permanent image was captured and stored in the record. A microwire was placed and exchanged for a 4/5 French slender sheath. The sheath was flushed followed by installation of standard radial cocktail. A 5 French MG2 glide catheter and Bentson wire were directed under fluoroscopic visualization  through the left upper extremity, around the aortic arch, through the descending thoracic aorta and into the abdominal aorta. The catheter was then directed into the right internal iliac artery. Right internal iliac artery angiogram demonstrated patency of the anterior and posterior divisions with the prostatic artery appearing to arise from the internal pudendal artery. A 1.9 French Progreat lambda microcatheter and standard 0.014 inch air Stahl wire within directed into the right internal pudendal artery. Angiogram was performed which demonstrated patency and origination of the right prostatic artery. The right prostatic artery was then selected and right prostatic artery angiogram was performed. Cone beam CT was then performed which demonstrated a proximal inferior rectal branch about the prostatic artery. There are no additional evidence of nontarget branch vessels. The inferior rectal artery was then selected and angiogram was performed which demonstrated no evidence of nontarget branch vessels. Next, proximal embolization of the right inferior rectal artery was performed with a single 1 mm x 5 mm low profile Ruby detachable coil. The catheter was retracted into the parent vessel and injection of contrast demonstrated adequate embolization and preserved patency of the right prostatic artery. The microcatheter was then directed deeper into the distal prostatic artery and particle embolization with dilute 400 micron hydro pleural see years was performed until stasis was achieved. The catheter was then retracted into the proximal prostatic artery and completion arteriogram demonstrated adequate embolization of the prostatic parenchyma. The microcatheter was then removed. The 5 French base catheter was then directed under fluoroscopic guidance to the left internal iliac artery. Angiogram was performed which demonstrated patency of the posterior and anterior divisions with the prostatic artery arising from the mid  obturator artery. The obturator artery was then selected with the straight lambda Progreat microcatheter and Aristotle 14 microwire. Angiogram was performed in different obliquities to display the origin of the prostatic artery. There were several unsuccessful attempts with cannulating the prostatic artery including use of a fathom 14 microwire. Therefore, the catheter was exchanged for a triple angle lambda microcatheter which  was then able to successfully select the prostatic artery origin with wireless technique. Angiogram was performed of the prostatic artery which demonstrated opacification of the prostate and no evidence of nontarget branch vessels. There was some spasm within the prostatic artery, therefore 200 mcg of nitroglycerin was administered intra-arterially. Next, particle embolization of the left hemi prostate was performed with 400 micron hydro pearls. Due to the spasm visualized on prior arteriogram, the proximal prostatic artery was embolized with several low-profile Ruby detachable microcoils to ensure complete embolization. The catheter was retracted into the obturator artery and completion angiogram was performed which demonstrated appropriate and complete embolization of the left prostatic artery. The catheters were removed. A TR band was placed about the left wrist and inflated than the indwelling slender sheath was removed successfully. The patient tolerated the procedure well was transferred back to the floor in good condition. IMPRESSION: 1. Technically successful particle embolization of the right prostatic artery. 2. Shared origin of the right prostatic artery and right inferior rectal artery necessitating coil embolization of the right inferior rectal artery for embolic protection purposes. 3. Technically successful particle and coil embolization of the left prostatic artery. Marliss Coots, MD [facility] Radiology Electronically Signed    By: Marliss Coots M.D.   On: 01/17/2023 07:28   IR Angiogram Pelvis Selective Or Supraselective  Result Date: 01/17/2023 INDICATION: 77 year old male with history of prostatic hematuria, currently admitted as an inpatient. EXAM: 1. Ultrasound-guided vascular access of the left radial artery. 2. Selective catheterization and angiography of the right internal iliac, right internal pudendal, right inferior rectal, right prostatic, left common iliac, left internal iliac, left obturator, and left prostatic arteries. 3. Cone beam CT. 4. Coil embolization of right inferior rectal artery. 5. Right prostatic artery embolization. 6. Left prostatic artery embolization. MEDICATIONS: 200 mg ciprofloxacin, intravenous ANESTHESIA/SEDATION: Moderate (conscious) sedation was employed during this procedure. A total of Versed 2.5 mg and Fentanyl 100 mcg was administered intravenously. Moderate Sedation Time: 103 minutes. The patient's level of consciousness and vital signs were monitored continuously by radiology nursing throughout the procedure under my direct supervision. CONTRAST:  20 mL Omnipaque 300 FLUOROSCOPY: Radiation Exposure Index (as provided by the fluoroscopic device): 2,819 mGy Kerma COMPLICATIONS: None immediate. PROCEDURE: Informed consent was obtained from the patient following explanation of the procedure, risks, benefits and alternatives. The patient understands, agrees and consents for the procedure. All questions were addressed. A time out was performed prior to the initiation of the procedure. Maximal barrier sterile technique utilized including caps, mask, sterile gowns, sterile gloves, large sterile drape, hand hygiene, and Betadine prep. The left wrist was prepped and draped in standard fashion. Pulse oximeter was attached to the left thumb. Tora Perches test was performed, grade A. The left radial artery measured 0.4 cm in diameter. Subdermal Local anesthesia was provided at the planned needle entry site with  1% lidocaine. A small skin nick was made. Under direct ultrasound visualization, the left radial artery was punctured with a 21 gauge micropuncture needle. A permanent image was captured and stored in the record. A microwire was placed and exchanged for a 4/5 French slender sheath. The sheath was flushed followed by installation of standard radial cocktail. A 5 French MG2 glide catheter and Bentson wire were directed under fluoroscopic visualization through the left upper extremity, around the aortic arch, through the descending thoracic aorta and into the abdominal aorta. The catheter was then directed into the right internal iliac artery. Right internal iliac artery angiogram  demonstrated patency of the anterior and posterior divisions with the prostatic artery appearing to arise from the internal pudendal artery. A 1.9 French Progreat lambda microcatheter and standard 0.014 inch air Stahl wire within directed into the right internal pudendal artery. Angiogram was performed which demonstrated patency and origination of the right prostatic artery. The right prostatic artery was then selected and right prostatic artery angiogram was performed. Cone beam CT was then performed which demonstrated a proximal inferior rectal branch about the prostatic artery. There are no additional evidence of nontarget branch vessels. The inferior rectal artery was then selected and angiogram was performed which demonstrated no evidence of nontarget branch vessels. Next, proximal embolization of the right inferior rectal artery was performed with a single 1 mm x 5 mm low profile Ruby detachable coil. The catheter was retracted into the parent vessel and injection of contrast demonstrated adequate embolization and preserved patency of the right prostatic artery. The microcatheter was then directed deeper into the distal prostatic artery and particle embolization with dilute 400 micron hydro pleural see years was performed until stasis  was achieved. The catheter was then retracted into the proximal prostatic artery and completion arteriogram demonstrated adequate embolization of the prostatic parenchyma. The microcatheter was then removed. The 5 French base catheter was then directed under fluoroscopic guidance to the left internal iliac artery. Angiogram was performed which demonstrated patency of the posterior and anterior divisions with the prostatic artery arising from the mid obturator artery. The obturator artery was then selected with the straight lambda Progreat microcatheter and Aristotle 14 microwire. Angiogram was performed in different obliquities to display the origin of the prostatic artery. There were several unsuccessful attempts with cannulating the prostatic artery including use of a fathom 14 microwire. Therefore, the catheter was exchanged for a triple angle lambda microcatheter which was then able to successfully select the prostatic artery origin with wireless technique. Angiogram was performed of the prostatic artery which demonstrated opacification of the prostate and no evidence of nontarget branch vessels. There was some spasm within the prostatic artery, therefore 200 mcg of nitroglycerin was administered intra-arterially. Next, particle embolization of the left hemi prostate was performed with 400 micron hydro pearls. Due to the spasm visualized on prior arteriogram, the proximal prostatic artery was embolized with several low-profile Ruby detachable microcoils to ensure complete embolization. The catheter was retracted into the obturator artery and completion angiogram was performed which demonstrated appropriate and complete embolization of the left prostatic artery. The catheters were removed. A TR band was placed about the left wrist and inflated than the indwelling slender sheath was removed successfully. The patient tolerated the procedure well was transferred back to the floor in good condition. IMPRESSION: 1.  Technically successful particle embolization of the right prostatic artery. 2. Shared origin of the right prostatic artery and right inferior rectal artery necessitating coil embolization of the right inferior rectal artery for embolic protection purposes. 3. Technically successful particle and coil embolization of the left prostatic artery. Marliss Coots, MD [facility] Radiology Electronically Signed   By: Marliss Coots M.D.   On: 01/17/2023 07:28   IR 3D Independent Annabell Sabal  Result Date: 01/17/2023 INDICATION: 77 year old male with history of prostatic hematuria, currently admitted as an inpatient. EXAM: 1. Ultrasound-guided vascular access of the left radial artery. 2. Selective catheterization and angiography of the right internal iliac, right internal pudendal, right inferior rectal, right prostatic, left common iliac, left internal iliac, left obturator, and left prostatic arteries. 3.  Cone beam CT. 4. Coil embolization of right inferior rectal artery. 5. Right prostatic artery embolization. 6. Left prostatic artery embolization. MEDICATIONS: 200 mg ciprofloxacin, intravenous ANESTHESIA/SEDATION: Moderate (conscious) sedation was employed during this procedure. A total of Versed 2.5 mg and Fentanyl 100 mcg was administered intravenously. Moderate Sedation Time: 103 minutes. The patient's level of consciousness and vital signs were monitored continuously by radiology nursing throughout the procedure under my direct supervision. CONTRAST:  20 mL Omnipaque 300 FLUOROSCOPY: Radiation Exposure Index (as provided by the fluoroscopic device): 2,819 mGy Kerma COMPLICATIONS: None immediate. PROCEDURE: Informed consent was obtained from the patient following explanation of the procedure, risks, benefits and alternatives. The patient understands, agrees and consents for the procedure. All questions were addressed. A time out was performed prior to the initiation of the  procedure. Maximal barrier sterile technique utilized including caps, mask, sterile gowns, sterile gloves, large sterile drape, hand hygiene, and Betadine prep. The left wrist was prepped and draped in standard fashion. Pulse oximeter was attached to the left thumb. Tora Perches test was performed, grade A. The left radial artery measured 0.4 cm in diameter. Subdermal Local anesthesia was provided at the planned needle entry site with 1% lidocaine. A small skin nick was made. Under direct ultrasound visualization, the left radial artery was punctured with a 21 gauge micropuncture needle. A permanent image was captured and stored in the record. A microwire was placed and exchanged for a 4/5 French slender sheath. The sheath was flushed followed by installation of standard radial cocktail. A 5 French MG2 glide catheter and Bentson wire were directed under fluoroscopic visualization through the left upper extremity, around the aortic arch, through the descending thoracic aorta and into the abdominal aorta. The catheter was then directed into the right internal iliac artery. Right internal iliac artery angiogram demonstrated patency of the anterior and posterior divisions with the prostatic artery appearing to arise from the internal pudendal artery. A 1.9 [place] Progreat lambda microcatheter and standard 0.014 inch air Stahl wire within directed into the right internal pudendal artery. Angiogram was performed which demonstrated patency and origination of the right prostatic artery. The right prostatic artery was then selected and right prostatic artery angiogram was performed. Cone beam CT was then performed which demonstrated a proximal inferior rectal branch about the prostatic artery. There are no additional evidence of nontarget branch vessels. The inferior rectal artery was then selected and angiogram was performed which demonstrated no evidence of nontarget branch vessels. Next, proximal embolization of the right  inferior rectal artery was performed with a single 1 mm x 5 mm low profile Ruby detachable coil. The catheter was retracted into the parent vessel and injection of contrast demonstrated adequate embolization and preserved patency of the right prostatic artery. The microcatheter was then directed deeper into the distal prostatic artery and particle embolization with dilute 400 micron hydro pleural see years was performed until stasis was achieved. The catheter was then retracted into the proximal prostatic artery and completion arteriogram demonstrated adequate embolization of the prostatic parenchyma. The microcatheter was then removed. The 5 French base catheter was then directed under fluoroscopic guidance to the left internal iliac artery. Angiogram was performed which demonstrated patency of the posterior and anterior divisions with the prostatic artery arising from the mid obturator artery. The obturator artery was then selected with the straight lambda Progreat microcatheter and Aristotle 14 microwire. Angiogram was performed in different obliquities to display the origin of the prostatic artery. There were several unsuccessful attempts with cannulating  the prostatic artery including use of a fathom 14 microwire. Therefore, the catheter was exchanged for a triple angle lambda microcatheter which was then able to successfully select the prostatic artery origin with wireless technique. Angiogram was performed of the prostatic artery which demonstrated opacification of the prostate and no evidence of nontarget branch vessels. There was some spasm within the prostatic artery, therefore 200 mcg of nitroglycerin was administered intra-arterially. Next, particle embolization of the left hemi prostate was performed with 400 micron hydro pearls. Due to the spasm visualized on prior arteriogram, the proximal prostatic artery was embolized with several low-profile Ruby detachable microcoils to ensure complete  embolization. The catheter was retracted into the obturator artery and completion angiogram was performed which demonstrated appropriate and complete embolization of the left prostatic artery. The catheters were removed. A TR band was placed about the left wrist and inflated than the indwelling slender sheath was removed successfully. The patient tolerated the procedure well was transferred back to the floor in good condition. IMPRESSION: 1. Technically successful particle embolization of the right prostatic artery. 2. Shared origin of the right prostatic artery and right inferior rectal artery necessitating coil embolization of the right inferior rectal artery for embolic protection purposes. 3. Technically successful particle and coil embolization of the left prostatic artery. Marliss Coots, MD [facility] Radiology Electronically Signed   By: Marliss Coots M.D.   On: 01/17/2023 07:28   IR Angiogram Selective Each Additional Vessel  Result Date: 01/17/2023 INDICATION: 77 year old male with history of prostatic hematuria, currently admitted as an inpatient. EXAM: 1. Ultrasound-guided vascular access of the left radial artery. 2. Selective catheterization and angiography of the right internal iliac, right internal pudendal, right inferior rectal, right prostatic, left common iliac, left internal iliac, left obturator, and left prostatic arteries. 3. Cone beam CT. 4. Coil embolization of right inferior rectal artery. 5. Right prostatic artery embolization. 6. Left prostatic artery embolization. MEDICATIONS: 200 mg ciprofloxacin, intravenous ANESTHESIA/SEDATION: Moderate (conscious) sedation was employed during this procedure. A total of Versed 2.5 mg and Fentanyl 100 mcg was administered intravenously. Moderate Sedation Time: 103 minutes. The patient's level of consciousness and vital signs were monitored continuously by radiology nursing throughout the procedure  under my direct supervision. CONTRAST:  20 mL Omnipaque 300 FLUOROSCOPY: Radiation Exposure Index (as provided by the fluoroscopic device): 2,819 mGy Kerma COMPLICATIONS: None immediate. PROCEDURE: Informed consent was obtained from the patient following explanation of the procedure, risks, benefits and alternatives. The patient understands, agrees and consents for the procedure. All questions were addressed. A time out was performed prior to the initiation of the procedure. Maximal barrier sterile technique utilized including caps, mask, sterile gowns, sterile gloves, large sterile drape, hand hygiene, and Betadine prep. The left wrist was prepped and draped in standard fashion. Pulse oximeter was attached to the left thumb. Tora Perches test was performed, grade A. The left radial artery measured 0.4 cm in diameter. Subdermal Local anesthesia was provided at the planned needle entry site with 1% lidocaine. A small skin nick was made. Under direct ultrasound visualization, the left radial artery was punctured with a 21 gauge micropuncture needle. A permanent image was captured and stored in the record. A microwire was placed and exchanged for a 4/5 French slender sheath. The sheath was flushed followed by installation of standard radial cocktail. A 5 French MG2 glide catheter and Bentson wire were directed under fluoroscopic visualization through the left upper extremity, around the aortic arch, through the descending thoracic  aorta and into the abdominal aorta. The catheter was then directed into the right internal iliac artery. Right internal iliac artery angiogram demonstrated patency of the anterior and posterior divisions with the prostatic artery appearing to arise from the internal pudendal artery. A 1.9 French Progreat lambda microcatheter and standard 0.014 inch air Stahl wire within directed into the right internal pudendal artery. Angiogram was performed which demonstrated patency and origination of the right  prostatic artery. The right prostatic artery was then selected and right prostatic artery angiogram was performed. Cone beam CT was then performed which demonstrated a proximal inferior rectal branch about the prostatic artery. There are no additional evidence of nontarget branch vessels. The inferior rectal artery was then selected and angiogram was performed which demonstrated no evidence of nontarget branch vessels. Next, proximal embolization of the right inferior rectal artery was performed with a single 1 mm x 5 mm low profile Ruby detachable coil. The catheter was retracted into the parent vessel and injection of contrast demonstrated adequate embolization and preserved patency of the right prostatic artery. The microcatheter was then directed deeper into the distal prostatic artery and particle embolization with dilute 400 micron hydro pleural see years was performed until stasis was achieved. The catheter was then retracted into the proximal prostatic artery and completion arteriogram demonstrated adequate embolization of the prostatic parenchyma. The microcatheter was then removed. The 5 French base catheter was then directed under fluoroscopic guidance to the left internal iliac artery. Angiogram was performed which demonstrated patency of the posterior and anterior divisions with the prostatic artery arising from the mid obturator artery. The obturator artery was then selected with the straight lambda Progreat microcatheter and Aristotle 14 microwire. Angiogram was performed in different obliquities to display the origin of the prostatic artery. There were several unsuccessful attempts with cannulating the prostatic artery including use of a fathom 14 microwire. Therefore, the catheter was exchanged for a triple angle lambda microcatheter which was then able to successfully select the prostatic artery origin with wireless technique. Angiogram was performed of the prostatic artery which demonstrated  opacification of the prostate and no evidence of nontarget branch vessels. There was some spasm within the prostatic artery, therefore 200 mcg of nitroglycerin was administered intra-arterially. Next, particle embolization of the left hemi prostate was performed with 400 micron hydro pearls. Due to the spasm visualized on prior arteriogram, the proximal prostatic artery was embolized with several low-profile Ruby detachable microcoils to ensure complete embolization. The catheter was retracted into the obturator artery and completion angiogram was performed which demonstrated appropriate and complete embolization of the left prostatic artery. The catheters were removed. A TR band was placed about the left wrist and inflated than the indwelling slender sheath was removed successfully. The patient tolerated the procedure well was transferred back to the floor in good condition. IMPRESSION: 1. Technically successful particle embolization of the right prostatic artery. 2. Shared origin of the right prostatic artery and right inferior rectal artery necessitating coil embolization of the right inferior rectal artery for embolic protection purposes. 3. Technically successful particle and coil embolization of the left prostatic artery. Marliss Coots, MD [facility] Radiology Electronically Signed   By: Marliss Coots M.D.   On: 01/17/2023 07:28   IR Angiogram Selective Each Additional Vessel  Result Date: 01/17/2023 INDICATION: 77 year old male with history of prostatic hematuria, currently admitted as an inpatient. EXAM: 1. Ultrasound-guided vascular access of the left radial artery. 2. Selective catheterization and angiography of the  right internal iliac, right internal pudendal, right inferior rectal, right prostatic, left common iliac, left internal iliac, left obturator, and left prostatic arteries. 3. Cone beam CT. 4. Coil embolization of right inferior rectal artery.  5. Right prostatic artery embolization. 6. Left prostatic artery embolization. MEDICATIONS: 200 mg ciprofloxacin, intravenous ANESTHESIA/SEDATION: Moderate (conscious) sedation was employed during this procedure. A total of Versed 2.5 mg and Fentanyl 100 mcg was administered intravenously. Moderate Sedation Time: 103 minutes. The patient's level of consciousness and vital signs were monitored continuously by radiology nursing throughout the procedure under my direct supervision. CONTRAST:  20 mL Omnipaque 300 FLUOROSCOPY: Radiation Exposure Index (as provided by the fluoroscopic device): 2,819 mGy Kerma COMPLICATIONS: None immediate. PROCEDURE: Informed consent was obtained from the patient following explanation of the procedure, risks, benefits and alternatives. The patient understands, agrees and consents for the procedure. All questions were addressed. A time out was performed prior to the initiation of the procedure. Maximal barrier sterile technique utilized including caps, mask, sterile gowns, sterile gloves, large sterile drape, hand hygiene, and Betadine prep. The left wrist was prepped and draped in standard fashion. Pulse oximeter was attached to the left thumb. Tora Perches test was performed, grade A. The left radial artery measured 0.4 cm in diameter. Subdermal Local anesthesia was provided at the planned needle entry site with 1% lidocaine. A small skin nick was made. Under direct ultrasound visualization, the left radial artery was punctured with a 21 gauge micropuncture needle. A permanent image was captured and stored in the record. A microwire was placed and exchanged for a 4/5 French slender sheath. The sheath was flushed followed by installation of standard radial cocktail. A 5 French MG2 glide catheter and Bentson wire were directed under fluoroscopic visualization through the left upper extremity, around the aortic arch, through the descending thoracic aorta and into the abdominal aorta. The  catheter was then directed into the right internal iliac artery. Right internal iliac artery angiogram demonstrated patency of the anterior and posterior divisions with the prostatic artery appearing to arise from the internal pudendal artery. A 1.9 French Progreat lambda microcatheter and standard 0.014 inch air Stahl wire within directed into the right internal pudendal artery. Angiogram was performed which demonstrated patency and origination of the right prostatic artery. The right prostatic artery was then selected and right prostatic artery angiogram was performed. Cone beam CT was then performed which demonstrated a proximal inferior rectal branch about the prostatic artery. There are no additional evidence of nontarget branch vessels. The inferior rectal artery was then selected and angiogram was performed which demonstrated no evidence of nontarget branch vessels. Next, proximal embolization of the right inferior rectal artery was performed with a single 1 mm x 5 mm low profile Ruby detachable coil. The catheter was retracted into the parent vessel and injection of contrast demonstrated adequate embolization and preserved patency of the right prostatic artery. The microcatheter was then directed deeper into the distal prostatic artery and particle embolization with dilute 400 micron hydro pleural see years was performed until stasis was achieved. The catheter was then retracted into the proximal prostatic artery and completion arteriogram demonstrated adequate embolization of the prostatic parenchyma. The microcatheter was then removed. The 5 French base catheter was then directed under fluoroscopic guidance to the left internal iliac artery. Angiogram was performed which demonstrated patency of the posterior and anterior divisions with the prostatic artery arising from the mid obturator artery. The obturator artery was then selected with the straight lambda Progreat microcatheter and  Aristotle 14  microwire. Angiogram was performed in different obliquities to display the origin of the prostatic artery. There were several unsuccessful attempts with cannulating the prostatic artery including use of a fathom 14 microwire. Therefore, the catheter was exchanged for a triple angle lambda microcatheter which was then able to successfully select the prostatic artery origin with wireless technique. Angiogram was performed of the prostatic artery which demonstrated opacification of the prostate and no evidence of nontarget branch vessels. There was some spasm within the prostatic artery, therefore 200 mcg of nitroglycerin was administered intra-arterially. Next, particle embolization of the left hemi prostate was performed with 400 micron hydro pearls. Due to the spasm visualized on prior arteriogram, the proximal prostatic artery was embolized with several low-profile Ruby detachable microcoils to ensure complete embolization. The catheter was retracted into the obturator artery and completion angiogram was performed which demonstrated appropriate and complete embolization of the left prostatic artery. The catheters were removed. A TR band was placed about the left wrist and inflated than the indwelling slender sheath was removed successfully. The patient tolerated the procedure well was transferred back to the floor in good condition. IMPRESSION: 1. Technically successful particle embolization of the right prostatic artery. 2. Shared origin of the right prostatic artery and right inferior rectal artery necessitating coil embolization of the right inferior rectal artery for embolic protection purposes. 3. Technically successful particle and coil embolization of the left prostatic artery. Marliss Coots, MD [facility] Radiology Electronically Signed   By: Marliss Coots M.D.   On: 01/17/2023 07:28   IR CT PELVIS W/CM  Result Date: 01/17/2023 INDICATION: 77 year old male  with history of prostatic hematuria, currently admitted as an inpatient. EXAM: 1. Ultrasound-guided vascular access of the left radial artery. 2. Selective catheterization and angiography of the right internal iliac, right internal pudendal, right inferior rectal, right prostatic, left common iliac, left internal iliac, left obturator, and left prostatic arteries. 3. Cone beam CT. 4. Coil embolization of right inferior rectal artery. 5. Right prostatic artery embolization. 6. Left prostatic artery embolization. MEDICATIONS: 200 mg ciprofloxacin, intravenous ANESTHESIA/SEDATION: Moderate (conscious) sedation was employed during this procedure. A total of Versed 2.5 mg and Fentanyl 100 mcg was administered intravenously. Moderate Sedation Time: 103 minutes. The patient's level of consciousness and vital signs were monitored continuously by radiology nursing throughout the procedure under my direct supervision. CONTRAST:  20 mL Omnipaque 300 FLUOROSCOPY: Radiation Exposure Index (as provided by the fluoroscopic device): 2,819 mGy Kerma COMPLICATIONS: None immediate. PROCEDURE: Informed consent was obtained from the patient following explanation of the procedure, risks, benefits and alternatives. The patient understands, agrees and consents for the procedure. All questions were addressed. A time out was performed prior to the initiation of the procedure. Maximal barrier sterile technique utilized including caps, mask, sterile gowns, sterile gloves, large sterile drape, hand hygiene, and Betadine prep. The left wrist was prepped and draped in standard fashion. Pulse oximeter was attached to the left thumb. Tora Perches test was performed, grade A. The left radial artery measured 0.4 cm in diameter. Subdermal Local anesthesia was provided at the planned needle entry site with 1% lidocaine. A small skin nick was made. Under direct ultrasound visualization, the left radial artery was punctured with a 21 gauge micropuncture  needle. A permanent image was captured and stored in the record. A microwire was placed and exchanged for a 4/5 French slender sheath. The sheath was flushed followed by installation of standard radial cocktail. A 5 French MG2  glide catheter and Bentson wire were directed under fluoroscopic visualization through the left upper extremity, around the aortic arch, through the descending thoracic aorta and into the abdominal aorta. The catheter was then directed into the right internal iliac artery. Right internal iliac artery angiogram demonstrated patency of the anterior and posterior divisions with the prostatic artery appearing to arise from the internal pudendal artery. A 1.9 French Progreat lambda microcatheter and standard 0.014 inch air Stahl wire within directed into the right internal pudendal artery. Angiogram was performed which demonstrated patency and origination of the right prostatic artery. The right prostatic artery was then selected and right prostatic artery angiogram was performed. Cone beam CT was then performed which demonstrated a proximal inferior rectal branch about the prostatic artery. There are no additional evidence of nontarget branch vessels. The inferior rectal artery was then selected and angiogram was performed which demonstrated no evidence of nontarget branch vessels. Next, proximal embolization of the right inferior rectal artery was performed with a single 1 mm x 5 mm low profile Ruby detachable coil. The catheter was retracted into the parent vessel and injection of contrast demonstrated adequate embolization and preserved patency of the right prostatic artery. The microcatheter was then directed deeper into the distal prostatic artery and particle embolization with dilute 400 micron hydro pleural see years was performed until stasis was achieved. The catheter was then retracted into the proximal prostatic artery and completion arteriogram demonstrated adequate embolization of the  prostatic parenchyma. The microcatheter was then removed. The 5 French base catheter was then directed under fluoroscopic guidance to the left internal iliac artery. Angiogram was performed which demonstrated patency of the posterior and anterior divisions with the prostatic artery arising from the mid obturator artery. The obturator artery was then selected with the straight lambda Progreat microcatheter and Aristotle 14 microwire. Angiogram was performed in different obliquities to display the origin of the prostatic artery. There were several unsuccessful attempts with cannulating the prostatic artery including use of a fathom 14 microwire. Therefore, the catheter was exchanged for a triple angle lambda microcatheter which was then able to successfully select the prostatic artery origin with wireless technique. Angiogram was performed of the prostatic artery which demonstrated opacification of the prostate and no evidence of nontarget branch vessels. There was some spasm within the prostatic artery, therefore 200 mcg of nitroglycerin was administered intra-arterially. Next, particle embolization of the left hemi prostate was performed with 400 micron hydro pearls. Due to the spasm visualized on prior arteriogram, the proximal prostatic artery was embolized with several low-profile Ruby detachable microcoils to ensure complete embolization. The catheter was retracted into the obturator artery and completion angiogram was performed which demonstrated appropriate and complete embolization of the left prostatic artery. The catheters were removed. A TR band was placed about the left wrist and inflated than the indwelling slender sheath was removed successfully. The patient tolerated the procedure well was transferred back to the floor in good condition. IMPRESSION: 1. Technically successful particle embolization of the right prostatic artery. 2. Shared origin of the right prostatic artery and right inferior rectal  artery necessitating coil embolization of the right inferior rectal artery for embolic protection purposes. 3. Technically successful particle and coil embolization of the left prostatic artery. Marliss Coots, MD [facility] Radiology Electronically Signed   By: Marliss Coots M.D.   On: 01/17/2023 07:28   IR Angiogram Selective Each Additional Vessel  Result Date: 01/17/2023 INDICATION: 77 year old male with history of prostatic  hematuria, currently admitted as an inpatient. EXAM: 1. Ultrasound-guided vascular access of the left radial artery. 2. Selective catheterization and angiography of the right internal iliac, right internal pudendal, right inferior rectal, right prostatic, left common iliac, left internal iliac, left obturator, and left prostatic arteries. 3. Cone beam CT. 4. Coil embolization of right inferior rectal artery. 5. Right prostatic artery embolization. 6. Left prostatic artery embolization. MEDICATIONS: 200 mg ciprofloxacin, intravenous ANESTHESIA/SEDATION: Moderate (conscious) sedation was employed during this procedure. A total of Versed 2.5 mg and Fentanyl 100 mcg was administered intravenously. Moderate Sedation Time: 103 minutes. The patient's level of consciousness and vital signs were monitored continuously by radiology nursing throughout the procedure under my direct supervision. CONTRAST:  20 mL Omnipaque 300 FLUOROSCOPY: Radiation Exposure Index (as provided by the fluoroscopic device): 2,819 mGy Kerma COMPLICATIONS: None immediate. PROCEDURE: Informed consent was obtained from the patient following explanation of the procedure, risks, benefits and alternatives. The patient understands, agrees and consents for the procedure. All questions were addressed. A time out was performed prior to the initiation of the procedure. Maximal barrier sterile technique utilized including caps, mask, sterile gowns, sterile gloves, large sterile drape,  hand hygiene, and Betadine prep. The left wrist was prepped and draped in standard fashion. Pulse oximeter was attached to the left thumb. Tora Perches test was performed, grade A. The left radial artery measured 0.4 cm in diameter. Subdermal Local anesthesia was provided at the planned needle entry site with 1% lidocaine. A small skin nick was made. Under direct ultrasound visualization, the left radial artery was punctured with a 21 gauge micropuncture needle. A permanent image was captured and stored in the record. A microwire was placed and exchanged for a 4/5 French slender sheath. The sheath was flushed followed by installation of standard radial cocktail. A 5 French MG2 glide catheter and Bentson wire were directed under fluoroscopic visualization through the left upper extremity, around the aortic arch, through the descending thoracic aorta and into the abdominal aorta. The catheter was then directed into the right internal iliac artery. Right internal iliac artery angiogram demonstrated patency of the anterior and posterior divisions with the prostatic artery appearing to arise from the internal pudendal artery. A 1.9 French Progreat lambda microcatheter and standard 0.014 inch air Stahl wire within directed into the right internal pudendal artery. Angiogram was performed which demonstrated patency and origination of the right prostatic artery. The right prostatic artery was then selected and right prostatic artery angiogram was performed. Cone beam CT was then performed which demonstrated a proximal inferior rectal branch about the prostatic artery. There are no additional evidence of nontarget branch vessels. The inferior rectal artery was then selected and angiogram was performed which demonstrated no evidence of nontarget branch vessels. Next, proximal embolization of the right inferior rectal artery was performed with a single 1 mm x 5 mm low profile Ruby detachable coil. The catheter was retracted into the  parent vessel and injection of contrast demonstrated adequate embolization and preserved patency of the right prostatic artery. The microcatheter was then directed deeper into the distal prostatic artery and particle embolization with dilute 400 micron hydro pleural see years was performed until stasis was achieved. The catheter was then retracted into the proximal prostatic artery and completion arteriogram demonstrated adequate embolization of the prostatic parenchyma. The microcatheter was then removed. The 5 French base catheter was then directed under fluoroscopic guidance to the left internal iliac artery. Angiogram was performed which demonstrated patency of the posterior and anterior divisions  with the prostatic artery arising from the mid obturator artery. The obturator artery was then selected with the straight lambda Progreat microcatheter and Aristotle 14 microwire. Angiogram was performed in different obliquities to display the origin of the prostatic artery. There were several unsuccessful attempts with cannulating the prostatic artery including use of a fathom 14 microwire. Therefore, the catheter was exchanged for a triple angle lambda microcatheter which was then able to successfully select the prostatic artery origin with wireless technique. Angiogram was performed of the prostatic artery which demonstrated opacification of the prostate and no evidence of nontarget branch vessels. There was some spasm within the prostatic artery, therefore 200 mcg of nitroglycerin was administered intra-arterially. Next, particle embolization of the left hemi prostate was performed with 400 micron hydro pearls. Due to the spasm visualized on prior arteriogram, the proximal prostatic artery was embolized with several low-profile Ruby detachable microcoils to ensure complete embolization. The catheter was retracted into the obturator artery and completion angiogram was performed which demonstrated appropriate and  complete embolization of the left prostatic artery. The catheters were removed. A TR band was placed about the left wrist and inflated than the indwelling slender sheath was removed successfully. The patient tolerated the procedure well was transferred back to the floor in good condition. IMPRESSION: 1. Technically successful particle embolization of the right prostatic artery. 2. Shared origin of the right prostatic artery and right inferior rectal artery necessitating coil embolization of the right inferior rectal artery for embolic protection purposes. 3. Technically successful particle and coil embolization of the left prostatic artery. Marliss Coots, MD [facility] Radiology Electronically Signed   By: Marliss Coots M.D.   On: 01/17/2023 07:28   IR Angiogram Selective Each Additional Vessel  Result Date: 01/17/2023 INDICATION: 77 year old male with history of prostatic hematuria, currently admitted as an inpatient. EXAM: 1. Ultrasound-guided vascular access of the left radial artery. 2. Selective catheterization and angiography of the right internal iliac, right internal pudendal, right inferior rectal, right prostatic, left common iliac, left internal iliac, left obturator, and left prostatic arteries. 3. Cone beam CT. 4. Coil embolization of right inferior rectal artery. 5. Right prostatic artery embolization. 6. Left prostatic artery embolization. MEDICATIONS: 200 mg ciprofloxacin, intravenous ANESTHESIA/SEDATION: Moderate (conscious) sedation was employed during this procedure. A total of Versed 2.5 mg and Fentanyl 100 mcg was administered intravenously. Moderate Sedation Time: 103 minutes. The patient's level of consciousness and vital signs were monitored continuously by radiology nursing throughout the procedure under my direct supervision. CONTRAST:  20 mL Omnipaque 300 FLUOROSCOPY: Radiation Exposure Index (as provided by the fluoroscopic device):  2,819 mGy Kerma COMPLICATIONS: None immediate. PROCEDURE: Informed consent was obtained from the patient following explanation of the procedure, risks, benefits and alternatives. The patient understands, agrees and consents for the procedure. All questions were addressed. A time out was performed prior to the initiation of the procedure. Maximal barrier sterile technique utilized including caps, mask, sterile gowns, sterile gloves, large sterile drape, hand hygiene, and Betadine prep. The left wrist was prepped and draped in standard fashion. Pulse oximeter was attached to the left thumb. Tora Perches test was performed, grade A. The left radial artery measured 0.4 cm in diameter. Subdermal Local anesthesia was provided at the planned needle entry site with 1% lidocaine. A small skin nick was made. Under direct ultrasound visualization, the left radial artery was punctured with a 21 gauge micropuncture needle. A permanent image was captured and stored in the record. A microwire was  placed and exchanged for a 4/5 French slender sheath. The sheath was flushed followed by installation of standard radial cocktail. A 5 French MG2 glide catheter and Bentson wire were directed under fluoroscopic visualization through the left upper extremity, around the aortic arch, through the descending thoracic aorta and into the abdominal aorta. The catheter was then directed into the right internal iliac artery. Right internal iliac artery angiogram demonstrated patency of the anterior and posterior divisions with the prostatic artery appearing to arise from the internal pudendal artery. A 1.9 French Progreat lambda microcatheter and standard 0.014 inch air Stahl wire within directed into the right internal pudendal artery. Angiogram was performed which demonstrated patency and origination of the right prostatic artery. The right prostatic artery was then selected and right prostatic artery angiogram was performed. Cone beam CT was then  performed which demonstrated a proximal inferior rectal branch about the prostatic artery. There are no additional evidence of nontarget branch vessels. The inferior rectal artery was then selected and angiogram was performed which demonstrated no evidence of nontarget branch vessels. Next, proximal embolization of the right inferior rectal artery was performed with a single 1 mm x 5 mm low profile Ruby detachable coil. The catheter was retracted into the parent vessel and injection of contrast demonstrated adequate embolization and preserved patency of the right prostatic artery. The microcatheter was then directed deeper into the distal prostatic artery and particle embolization with dilute 400 micron hydro pleural see years was performed until stasis was achieved. The catheter was then retracted into the proximal prostatic artery and completion arteriogram demonstrated adequate embolization of the prostatic parenchyma. The microcatheter was then removed. The 5 French base catheter was then directed under fluoroscopic guidance to the left internal iliac artery. Angiogram was performed which demonstrated patency of the posterior and anterior divisions with the prostatic artery arising from the mid obturator artery. The obturator artery was then selected with the straight lambda Progreat microcatheter and Aristotle 14 microwire. Angiogram was performed in different obliquities to display the origin of the prostatic artery. There were several unsuccessful attempts with cannulating the prostatic artery including use of a fathom 14 microwire. Therefore, the catheter was exchanged for a triple angle lambda microcatheter which was then able to successfully select the prostatic artery origin with wireless technique. Angiogram was performed of the prostatic artery which demonstrated opacification of the prostate and no evidence of nontarget branch vessels. There was some spasm within the prostatic artery, therefore 200 mcg  of nitroglycerin was administered intra-arterially. Next, particle embolization of the left hemi prostate was performed with 400 micron hydro pearls. Due to the spasm visualized on prior arteriogram, the proximal prostatic artery was embolized with several low-profile Ruby detachable microcoils to ensure complete embolization. The catheter was retracted into the obturator artery and completion angiogram was performed which demonstrated appropriate and complete embolization of the left prostatic artery. The catheters were removed. A TR band was placed about the left wrist and inflated than the indwelling slender sheath was removed successfully. The patient tolerated the procedure well was transferred back to the floor in good condition. IMPRESSION: 1. Technically successful particle embolization of the right prostatic artery. 2. Shared origin of the right prostatic artery and right inferior rectal artery necessitating coil embolization of the right inferior rectal artery for embolic protection purposes. 3. Technically successful particle and coil embolization of the left prostatic artery. Marliss Coots, MD [facility] Radiology Electronically Signed   By: Jae Dire.D.  On: 01/17/2023 07:28   IR EMBO TUMOR ORGAN ISCHEMIA INFARCT INC GUIDE ROADMAPPING  Result Date: 01/17/2023 INDICATION: 77 year old male with history of prostatic hematuria, currently admitted as an inpatient. EXAM: 1. Ultrasound-guided vascular access of the left radial artery. 2. Selective catheterization and angiography of the right internal iliac, right internal pudendal, right inferior rectal, right prostatic, left common iliac, left internal iliac, left obturator, and left prostatic arteries. 3. Cone beam CT. 4. Coil embolization of right inferior rectal artery. 5. Right prostatic artery embolization. 6. Left prostatic artery embolization. MEDICATIONS: 200 mg ciprofloxacin, intravenous  ANESTHESIA/SEDATION: Moderate (conscious) sedation was employed during this procedure. A total of Versed 2.5 mg and Fentanyl 100 mcg was administered intravenously. Moderate Sedation Time: 103 minutes. The patient's level of consciousness and vital signs were monitored continuously by radiology nursing throughout the procedure under my direct supervision. CONTRAST:  20 mL Omnipaque 300 FLUOROSCOPY: Radiation Exposure Index (as provided by the fluoroscopic device): 2,819 mGy Kerma COMPLICATIONS: None immediate. PROCEDURE: Informed consent was obtained from the patient following explanation of the procedure, risks, benefits and alternatives. The patient understands, agrees and consents for the procedure. All questions were addressed. A time out was performed prior to the initiation of the procedure. Maximal barrier sterile technique utilized including caps, mask, sterile gowns, sterile gloves, large sterile drape, hand hygiene, and Betadine prep. The left wrist was prepped and draped in standard fashion. Pulse oximeter was attached to the left thumb. Tora Perches test was performed, grade A. The left radial artery measured 0.4 cm in diameter. Subdermal Local anesthesia was provided at the planned needle entry site with 1% lidocaine. A small skin nick was made. Under direct ultrasound visualization, the left radial artery was punctured with a 21 gauge micropuncture needle. A permanent image was captured and stored in the record. A microwire was placed and exchanged for a 4/5 French slender sheath. The sheath was flushed followed by installation of standard radial cocktail. A 5 French MG2 glide catheter and Bentson wire were directed under fluoroscopic visualization through the left upper extremity, around the aortic arch, through the descending thoracic aorta and into the abdominal aorta. The catheter was then directed into the right internal iliac artery. Right internal iliac artery angiogram demonstrated patency of the  anterior and posterior divisions with the prostatic artery appearing to arise from the internal pudendal artery. A 1.9 French Progreat lambda microcatheter and standard 0.014 inch air Stahl wire within directed into the right internal pudendal artery. Angiogram was performed which demonstrated patency and origination of the right prostatic artery. The right prostatic artery was then selected and right prostatic artery angiogram was performed. Cone beam CT was then performed which demonstrated a proximal inferior rectal branch about the prostatic artery. There are no additional evidence of nontarget branch vessels. The inferior rectal artery was then selected and angiogram was performed which demonstrated no evidence of nontarget branch vessels. Next, proximal embolization of the right inferior rectal artery was performed with a single 1 mm x 5 mm low profile Ruby detachable coil. The catheter was retracted into the parent vessel and injection of contrast demonstrated adequate embolization and preserved patency of the right prostatic artery. The microcatheter was then directed deeper into the distal prostatic artery and particle embolization with dilute 400 micron hydro pleural see years was performed until stasis was achieved. The catheter was then retracted into the proximal prostatic artery and completion arteriogram demonstrated adequate embolization of the prostatic parenchyma. The microcatheter was then removed. The 5 [place] base  catheter was then directed under fluoroscopic guidance to the left internal iliac artery. Angiogram was performed which demonstrated patency of the posterior and anterior divisions with the prostatic artery arising from the mid obturator artery. The obturator artery was then selected with the straight lambda Progreat microcatheter and Aristotle 14 microwire. Angiogram was performed in different obliquities to display the origin of the prostatic artery. There were several unsuccessful  attempts with cannulating the prostatic artery including use of a fathom 14 microwire. Therefore, the catheter was exchanged for a triple angle lambda microcatheter which was then able to successfully select the prostatic artery origin with wireless technique. Angiogram was performed of the prostatic artery which demonstrated opacification of the prostate and no evidence of nontarget branch vessels. There was some spasm within the prostatic artery, therefore 200 mcg of nitroglycerin was administered intra-arterially. Next, particle embolization of the left hemi prostate was performed with 400 micron hydro pearls. Due to the spasm visualized on prior arteriogram, the proximal prostatic artery was embolized with several low-profile Ruby detachable microcoils to ensure complete embolization. The catheter was retracted into the obturator artery and completion angiogram was performed which demonstrated appropriate and complete embolization of the left prostatic artery. The catheters were removed. A TR band was placed about the left wrist and inflated than the indwelling slender sheath was removed successfully. The patient tolerated the procedure well was transferred back to the floor in good condition. IMPRESSION: 1. Technically successful particle embolization of the right prostatic artery. 2. Shared origin of the right prostatic artery and right inferior rectal artery necessitating coil embolization of the right inferior rectal artery for embolic protection purposes. 3. Technically successful particle and coil embolization of the left prostatic artery. Marliss Coots, MD [facility] Radiology Electronically Signed   By: Marliss Coots M.D.   On: 01/17/2023 07:28    Assessment/Plan: #SPT Healing appropriately.  There is a little bit of fibrinous exudate and catheter is frequently pinned between his abdomen and pubis, causing some minor erosion on the lateral ends of the  incision.  Urine remains clear  # Urethral bleeding- Resolved Necrotic area of prostatic urethra was resected.  It appears that during coughing fit patient has bled heavily from this area.  HgB stable. Daily labs His Xarelto is on hold.  S/p prostate artery embolization. Urethral foley will stay in place until tomorrow morning. Foley then be removed, Xarelto restarted at discretion of primary, and pt can be discharged from a Urologic perspective.      LOS: 9 days   Elmon Kirschner, NP [facility] Pager: [phone]  01/17/2023, 9:50 AM

## 2023-01-17 NOTE — Plan of Care (Signed)
Urology called to bedside to assess non functional suprapubic tube with reported 700cc urine in bladder. No interventions had been attempted. On my arrival, SPT was confirmed patent with hand irrigation and bladder scan showed 0cc retained. No interventions indicated.

## 2023-01-17 NOTE — Plan of Care (Signed)
  Problem: Education: Goal: Knowledge of General Education information will improve Description: Including pain rating scale, medication(s)/side effects and non-pharmacologic comfort measures Outcome: Progressing   Problem: Health Behavior/Discharge Planning: Goal: Ability to manage health-related needs will improve Outcome: Progressing   Problem: Clinical Measurements: Goal: Cardiovascular complication will be avoided Outcome: Progressing   Problem: Nutrition: Goal: Adequate nutrition will be maintained Outcome: Progressing   Problem: Coping: Goal: Level of anxiety will decrease Outcome: Progressing   Problem: Pain Managment: Goal: General experience of comfort will improve Outcome: Progressing   Problem: Safety: Goal: Ability to remain free from injury will improve Outcome: Progressing

## 2023-01-17 NOTE — Progress Notes (Signed)
Chief Complaint: Patient was seen today for s/p prostate embo   Supervising Physician: Mir, Mauri Reading  Patient Status: [facility] - In-pt  Subjective: Doing well today, currently OOB getting into chair. Has tolerated po intake, but feels full 'maybe ate too much' No further bleeding  Objective: Physical Exam: BP (!) 150/118   Pulse 91   Temp 98.2 F (36.8 C) (Oral)   Resp 20   Ht 6' (1.829 m)   Wt 252 lb (114.3 kg)   SpO2 96%   BMI 34.18 kg/m  (L)radial dressing intact, dry. Hand warm, excellent pulse    Current Facility-Administered Medications:    acetaminophen (TYLENOL) tablet 650 mg, 650 mg, Oral, Q6H PRN, 650 mg at 01/15/23 0410 **OR** acetaminophen (TYLENOL) suppository 650 mg, 650 mg, Rectal, Q6H PRN, John Giovanni, MD   benzonatate (TESSALON) capsule 100 mg, 100 mg, Oral, TID, Dahal, Binaya, MD, 100 mg at 01/17/23 0917   Chlorhexidine Gluconate Cloth 2 % PADS 6 each, 6 each, Topical, Daily, Dahal, Binaya, MD, 6 each at 01/17/23 0952   dextromethorphan-guaiFENesin (MUCINEX DM) 30-600 MG per 12 hr tablet 1 tablet, 1 tablet, Oral, BID, Dahal, Melina Schools, MD, 1 tablet at 01/17/23 0917   diltiazem (CARDIZEM CD) 24 hr capsule 180 mg, 180 mg, Oral, Daily, Mahala Menghini, Jai-Gurmukh, MD, 180 mg at 01/17/23 0917   fiber supplement (BANATROL TF) liquid 60 mL, 60 mL, Oral, BID, Dahal, Binaya, MD, 60 mL at 01/17/23 0918   finasteride (PROSCAR) tablet 5 mg, 5 mg, Oral, Daily, McKenzie, Mardene Celeste, MD, 5 mg at 01/17/23 0918   furosemide (LASIX) injection 40 mg, 40 mg, Intravenous, Daily, Dahal, Binaya, MD, 40 mg at 01/17/23 0917   iohexol (OMNIPAQUE) 300 MG/ML solution 100 mL, 100 mL, Intra-arterial, Once PRN, Suttle, Dylan J, MD   iohexol (OMNIPAQUE) 300 MG/ML solution 100 mL, 100 mL, Intra-arterial, Once PRN, Suttle, Thressa Sheller, MD   levalbuterol (XOPENEX) nebulizer solution 0.63 mg, 0.63 mg, Nebulization, Q6H PRN, Luiz Iron, NP, 0.63 mg at 01/13/23 2341   metoprolol tartrate  (LOPRESSOR) injection 5 mg, 5 mg, Intravenous, Q4H PRN, Mahala Menghini, Jai-Gurmukh, MD   ondansetron (ZOFRAN) injection 4 mg, 4 mg, Intravenous, Q6H PRN, John Giovanni, MD   Oral care mouth rinse, 15 mL, Mouth Rinse, PRN, Lorin Glass, MD  Labs: CBC Recent Labs    01/16/23 0429 01/17/23 0407  WBC 10.7* 14.2*  HGB 11.7* 12.5*  HCT 34.5* 36.8*  PLT 432* 488*   BMET Recent Labs    01/16/23 0429 01/17/23 0407  NA 135 133*  K 3.6 3.6  CL 98 99  CO2 25 24  GLUCOSE 116* 122*  BUN 20 22  CREATININE 0.90 0.93  CALCIUM 9.1 9.3   LFT No results for input(s): "PROT", "ALBUMIN", "AST", "ALT", "ALKPHOS", "BILITOT", "BILIDIR", "IBILI", "LIPASE" in the last 72 hours. PT/INR Recent Labs    01/16/23 0429  LABPROT 14.2  INR 1.1     Studies/Results: IR EMBO TUMOR ORGAN ISCHEMIA INFARCT INC GUIDE ROADMAPPING  Result Date: 01/17/2023 INDICATION: 77 year old male with history of prostatic hematuria, currently admitted as an inpatient. EXAM: 1. Ultrasound-guided vascular access of the left radial artery. 2. Selective catheterization and angiography of the right internal iliac, right internal pudendal, right inferior rectal, right prostatic, left common iliac, left internal iliac, left obturator, and left prostatic arteries. 3. Cone beam CT. 4. Coil embolization of right inferior rectal artery. 5. Right prostatic artery embolization. 6. Left prostatic artery embolization. MEDICATIONS: 200 mg ciprofloxacin, intravenous ANESTHESIA/SEDATION: Moderate (conscious)  sedation was employed during this procedure. A total of Versed 2.5 mg and Fentanyl 100 mcg was administered intravenously. Moderate Sedation Time: 103 minutes. The patient's level of consciousness and vital signs were monitored continuously by radiology nursing throughout the procedure under my direct supervision. CONTRAST:  20 mL Omnipaque 300 FLUOROSCOPY: Radiation Exposure Index (as provided by the fluoroscopic device): 2,819 mGy Kerma  COMPLICATIONS: None immediate. PROCEDURE: Informed consent was obtained from the patient following explanation of the procedure, risks, benefits and alternatives. The patient understands, agrees and consents for the procedure. All questions were addressed. A time out was performed prior to the initiation of the procedure. Maximal barrier sterile technique utilized including caps, mask, sterile gowns, sterile gloves, large sterile drape, hand hygiene, and Betadine prep. The left wrist was prepped and draped in standard fashion. Pulse oximeter was attached to the left thumb. Tora Perches test was performed, grade A. The left radial artery measured 0.4 cm in diameter. Subdermal Local anesthesia was provided at the planned needle entry site with 1% lidocaine. A small skin nick was made. Under direct ultrasound visualization, the left radial artery was punctured with a 21 gauge micropuncture needle. A permanent image was captured and stored in the record. A microwire was placed and exchanged for a 4/5 French slender sheath. The sheath was flushed followed by installation of standard radial cocktail. A 5 French MG2 glide catheter and Bentson wire were directed under fluoroscopic visualization through the left upper extremity, around the aortic arch, through the descending thoracic aorta and into the abdominal aorta. The catheter was then directed into the right internal iliac artery. Right internal iliac artery angiogram demonstrated patency of the anterior and posterior divisions with the prostatic artery appearing to arise from the internal pudendal artery. A 1.9 French Progreat lambda microcatheter and standard 0.014 inch air Stahl wire within directed into the right internal pudendal artery. Angiogram was performed which demonstrated patency and origination of the right prostatic artery. The right prostatic artery was then selected and right prostatic artery angiogram was performed. Cone beam CT was then performed which  demonstrated a proximal inferior rectal branch about the prostatic artery. There are no additional evidence of nontarget branch vessels. The inferior rectal artery was then selected and angiogram was performed which demonstrated no evidence of nontarget branch vessels. Next, proximal embolization of the right inferior rectal artery was performed with a single 1 mm x 5 mm low profile Ruby detachable coil. The catheter was retracted into the parent vessel and injection of contrast demonstrated adequate embolization and preserved patency of the right prostatic artery. The microcatheter was then directed deeper into the distal prostatic artery and particle embolization with dilute 400 micron hydro pleural see years was performed until stasis was achieved. The catheter was then retracted into the proximal prostatic artery and completion arteriogram demonstrated adequate embolization of the prostatic parenchyma. The microcatheter was then removed. The 5 French base catheter was then directed under fluoroscopic guidance to the left internal iliac artery. Angiogram was performed which demonstrated patency of the posterior and anterior divisions with the prostatic artery arising from the mid obturator artery. The obturator artery was then selected with the straight lambda Progreat microcatheter and Aristotle 14 microwire. Angiogram was performed in different obliquities to display the origin of the prostatic artery. There were several unsuccessful attempts with cannulating the prostatic artery including use of a fathom 14 microwire. Therefore, the catheter was exchanged for a triple angle lambda microcatheter which was then able to successfully select the  prostatic artery origin with wireless technique. Angiogram was performed of the prostatic artery which demonstrated opacification of the prostate and no evidence of nontarget branch vessels. There was some spasm within the prostatic artery, therefore 200 mcg of  nitroglycerin was administered intra-arterially. Next, particle embolization of the left hemi prostate was performed with 400 micron hydro pearls. Due to the spasm visualized on prior arteriogram, the proximal prostatic artery was embolized with several low-profile Ruby detachable microcoils to ensure complete embolization. The catheter was retracted into the obturator artery and completion angiogram was performed which demonstrated appropriate and complete embolization of the left prostatic artery. The catheters were removed. A TR band was placed about the left wrist and inflated than the indwelling slender sheath was removed successfully. The patient tolerated the procedure well was transferred back to the floor in good condition. IMPRESSION: 1. Technically successful particle embolization of the right prostatic artery. 2. Shared origin of the right prostatic artery and right inferior rectal artery necessitating coil embolization of the right inferior rectal artery for embolic protection purposes. 3. Technically successful particle and coil embolization of the left prostatic artery. Marliss Coots, MD [facility] Radiology Electronically Signed   By: Marliss Coots M.D.   On: 01/17/2023 07:28   IR US Guide Vasc Access Left  Result Date: 01/17/2023 INDICATION: 77 year old male with history of prostatic hematuria, currently admitted as an inpatient. EXAM: 1. Ultrasound-guided vascular access of the left radial artery. 2. Selective catheterization and angiography of the right internal iliac, right internal pudendal, right inferior rectal, right prostatic, left common iliac, left internal iliac, left obturator, and left prostatic arteries. 3. Cone beam CT. 4. Coil embolization of right inferior rectal artery. 5. Right prostatic artery embolization. 6. Left prostatic artery embolization. MEDICATIONS: 200 mg ciprofloxacin, intravenous ANESTHESIA/SEDATION: Moderate  (conscious) sedation was employed during this procedure. A total of Versed 2.5 mg and Fentanyl 100 mcg was administered intravenously. Moderate Sedation Time: 103 minutes. The patient's level of consciousness and vital signs were monitored continuously by radiology nursing throughout the procedure under my direct supervision. CONTRAST:  20 mL Omnipaque 300 FLUOROSCOPY: Radiation Exposure Index (as provided by the fluoroscopic device): 2,819 mGy Kerma COMPLICATIONS: None immediate. PROCEDURE: Informed consent was obtained from the patient following explanation of the procedure, risks, benefits and alternatives. The patient understands, agrees and consents for the procedure. All questions were addressed. A time out was performed prior to the initiation of the procedure. Maximal barrier sterile technique utilized including caps, mask, sterile gowns, sterile gloves, large sterile drape, hand hygiene, and Betadine prep. The left wrist was prepped and draped in standard fashion. Pulse oximeter was attached to the left thumb. Tora Perches test was performed, grade A. The left radial artery measured 0.4 cm in diameter. Subdermal Local anesthesia was provided at the planned needle entry site with 1% lidocaine. A small skin nick was made. Under direct ultrasound visualization, the left radial artery was punctured with a 21 gauge micropuncture needle. A permanent image was captured and stored in the record. A microwire was placed and exchanged for a 4/5 French slender sheath. The sheath was flushed followed by installation of standard radial cocktail. A 5 French MG2 glide catheter and Bentson wire were directed under fluoroscopic visualization through the left upper extremity, around the aortic arch, through the descending thoracic aorta and into the abdominal aorta. The catheter was then directed into the right internal iliac artery. Right internal iliac artery angiogram demonstrated patency of the anterior and posterior  divisions  with the prostatic artery appearing to arise from the internal pudendal artery. A 1.9 French Progreat lambda microcatheter and standard 0.014 inch air Stahl wire within directed into the right internal pudendal artery. Angiogram was performed which demonstrated patency and origination of the right prostatic artery. The right prostatic artery was then selected and right prostatic artery angiogram was performed. Cone beam CT was then performed which demonstrated a proximal inferior rectal branch about the prostatic artery. There are no additional evidence of nontarget branch vessels. The inferior rectal artery was then selected and angiogram was performed which demonstrated no evidence of nontarget branch vessels. Next, proximal embolization of the right inferior rectal artery was performed with a single 1 mm x 5 mm low profile Ruby detachable coil. The catheter was retracted into the parent vessel and injection of contrast demonstrated adequate embolization and preserved patency of the right prostatic artery. The microcatheter was then directed deeper into the distal prostatic artery and particle embolization with dilute 400 micron hydro pleural see years was performed until stasis was achieved. The catheter was then retracted into the proximal prostatic artery and completion arteriogram demonstrated adequate embolization of the prostatic parenchyma. The microcatheter was then removed. The 5 French base catheter was then directed under fluoroscopic guidance to the left internal iliac artery. Angiogram was performed which demonstrated patency of the posterior and anterior divisions with the prostatic artery arising from the mid obturator artery. The obturator artery was then selected with the straight lambda Progreat microcatheter and Aristotle 14 microwire. Angiogram was performed in different obliquities to display the origin of the prostatic artery. There were several unsuccessful attempts with cannulating the  prostatic artery including use of a fathom 14 microwire. Therefore, the catheter was exchanged for a triple angle lambda microcatheter which was then able to successfully select the prostatic artery origin with wireless technique. Angiogram was performed of the prostatic artery which demonstrated opacification of the prostate and no evidence of nontarget branch vessels. There was some spasm within the prostatic artery, therefore 200 mcg of nitroglycerin was administered intra-arterially. Next, particle embolization of the left hemi prostate was performed with 400 micron hydro pearls. Due to the spasm visualized on prior arteriogram, the proximal prostatic artery was embolized with several low-profile Ruby detachable microcoils to ensure complete embolization. The catheter was retracted into the obturator artery and completion angiogram was performed which demonstrated appropriate and complete embolization of the left prostatic artery. The catheters were removed. A TR band was placed about the left wrist and inflated than the indwelling slender sheath was removed successfully. The patient tolerated the procedure well was transferred back to the floor in good condition. IMPRESSION: 1. Technically successful particle embolization of the right prostatic artery. 2. Shared origin of the right prostatic artery and right inferior rectal artery necessitating coil embolization of the right inferior rectal artery for embolic protection purposes. 3. Technically successful particle and coil embolization of the left prostatic artery. Marliss Coots, MD [facility] Radiology Electronically Signed   By: Marliss Coots M.D.   On: 01/17/2023 07:28   IR Angiogram Pelvis Selective Or Supraselective  Result Date: 01/17/2023 INDICATION: 77 year old male with history of prostatic hematuria, currently admitted as an inpatient. EXAM: 1. Ultrasound-guided vascular access of the left radial  artery. 2. Selective catheterization and angiography of the right internal iliac, right internal pudendal, right inferior rectal, right prostatic, left common iliac, left internal iliac, left obturator, and left prostatic arteries. 3. Cone beam CT. 4.  Coil embolization of right inferior rectal artery. 5. Right prostatic artery embolization. 6. Left prostatic artery embolization. MEDICATIONS: 200 mg ciprofloxacin, intravenous ANESTHESIA/SEDATION: Moderate (conscious) sedation was employed during this procedure. A total of Versed 2.5 mg and Fentanyl 100 mcg was administered intravenously. Moderate Sedation Time: 103 minutes. The patient's level of consciousness and vital signs were monitored continuously by radiology nursing throughout the procedure under my direct supervision. CONTRAST:  20 mL Omnipaque 300 FLUOROSCOPY: Radiation Exposure Index (as provided by the fluoroscopic device): 2,819 mGy Kerma COMPLICATIONS: None immediate. PROCEDURE: Informed consent was obtained from the patient following explanation of the procedure, risks, benefits and alternatives. The patient understands, agrees and consents for the procedure. All questions were addressed. A time out was performed prior to the initiation of the procedure. Maximal barrier sterile technique utilized including caps, mask, sterile gowns, sterile gloves, large sterile drape, hand hygiene, and Betadine prep. The left wrist was prepped and draped in standard fashion. Pulse oximeter was attached to the left thumb. Tora Perches test was performed, grade A. The left radial artery measured 0.4 cm in diameter. Subdermal Local anesthesia was provided at the planned needle entry site with 1% lidocaine. A small skin nick was made. Under direct ultrasound visualization, the left radial artery was punctured with a 21 gauge micropuncture needle. A permanent image was captured and stored in the record. A microwire was placed and exchanged for a 4/5 French slender sheath. The  sheath was flushed followed by installation of standard radial cocktail. A 5 French MG2 glide catheter and Bentson wire were directed under fluoroscopic visualization through the left upper extremity, around the aortic arch, through the descending thoracic aorta and into the abdominal aorta. The catheter was then directed into the right internal iliac artery. Right internal iliac artery angiogram demonstrated patency of the anterior and posterior divisions with the prostatic artery appearing to arise from the internal pudendal artery. A 1.9 French Progreat lambda microcatheter and standard 0.014 inch air Stahl wire within directed into the right internal pudendal artery. Angiogram was performed which demonstrated patency and origination of the right prostatic artery. The right prostatic artery was then selected and right prostatic artery angiogram was performed. Cone beam CT was then performed which demonstrated a proximal inferior rectal branch about the prostatic artery. There are no additional evidence of nontarget branch vessels. The inferior rectal artery was then selected and angiogram was performed which demonstrated no evidence of nontarget branch vessels. Next, proximal embolization of the right inferior rectal artery was performed with a single 1 mm x 5 mm low profile Ruby detachable coil. The catheter was retracted into the parent vessel and injection of contrast demonstrated adequate embolization and preserved patency of the right prostatic artery. The microcatheter was then directed deeper into the distal prostatic artery and particle embolization with dilute 400 micron hydro pleural see years was performed until stasis was achieved. The catheter was then retracted into the proximal prostatic artery and completion arteriogram demonstrated adequate embolization of the prostatic parenchyma. The microcatheter was then removed. The 5 French base catheter was then directed under fluoroscopic guidance to the  left internal iliac artery. Angiogram was performed which demonstrated patency of the posterior and anterior divisions with the prostatic artery arising from the mid obturator artery. The obturator artery was then selected with the straight lambda Progreat microcatheter and Aristotle 14 microwire. Angiogram was performed in different obliquities to display the origin of the prostatic artery. There were several unsuccessful attempts with cannulating the prostatic artery including  use of a fathom 14 microwire. Therefore, the catheter was exchanged for a triple angle lambda microcatheter which was then able to successfully select the prostatic artery origin with wireless technique. Angiogram was performed of the prostatic artery which demonstrated opacification of the prostate and no evidence of nontarget branch vessels. There was some spasm within the prostatic artery, therefore 200 mcg of nitroglycerin was administered intra-arterially. Next, particle embolization of the left hemi prostate was performed with 400 micron hydro pearls. Due to the spasm visualized on prior arteriogram, the proximal prostatic artery was embolized with several low-profile Ruby detachable microcoils to ensure complete embolization. The catheter was retracted into the obturator artery and completion angiogram was performed which demonstrated appropriate and complete embolization of the left prostatic artery. The catheters were removed. A TR band was placed about the left wrist and inflated than the indwelling slender sheath was removed successfully. The patient tolerated the procedure well was transferred back to the floor in good condition. IMPRESSION: 1. Technically successful particle embolization of the right prostatic artery. 2. Shared origin of the right prostatic artery and right inferior rectal artery necessitating coil embolization of the right inferior rectal artery for embolic protection purposes. 3. Technically successful  particle and coil embolization of the left prostatic artery. Marliss Coots, MD [facility] Radiology Electronically Signed   By: Marliss Coots M.D.   On: 01/17/2023 07:28   IR 3D Independent Annabell Sabal  Result Date: 01/17/2023 INDICATION: 77 year old male with history of prostatic hematuria, currently admitted as an inpatient. EXAM: 1. Ultrasound-guided vascular access of the left radial artery. 2. Selective catheterization and angiography of the right internal iliac, right internal pudendal, right inferior rectal, right prostatic, left common iliac, left internal iliac, left obturator, and left prostatic arteries. 3. Cone beam CT. 4. Coil embolization of right inferior rectal artery. 5. Right prostatic artery embolization. 6. Left prostatic artery embolization. MEDICATIONS: 200 mg ciprofloxacin, intravenous ANESTHESIA/SEDATION: Moderate (conscious) sedation was employed during this procedure. A total of Versed 2.5 mg and Fentanyl 100 mcg was administered intravenously. Moderate Sedation Time: 103 minutes. The patient's level of consciousness and vital signs were monitored continuously by radiology nursing throughout the procedure under my direct supervision. CONTRAST:  20 mL Omnipaque 300 FLUOROSCOPY: Radiation Exposure Index (as provided by the fluoroscopic device): 2,819 mGy Kerma COMPLICATIONS: None immediate. PROCEDURE: Informed consent was obtained from the patient following explanation of the procedure, risks, benefits and alternatives. The patient understands, agrees and consents for the procedure. All questions were addressed. A time out was performed prior to the initiation of the procedure. Maximal barrier sterile technique utilized including caps, mask, sterile gowns, sterile gloves, large sterile drape, hand hygiene, and Betadine prep. The left wrist was prepped and draped in standard fashion. Pulse oximeter was attached to the left thumb. Tora Perches test was  performed, grade A. The left radial artery measured 0.4 cm in diameter. Subdermal Local anesthesia was provided at the planned needle entry site with 1% lidocaine. A small skin nick was made. Under direct ultrasound visualization, the left radial artery was punctured with a 21 gauge micropuncture needle. A permanent image was captured and stored in the record. A microwire was placed and exchanged for a 4/5 French slender sheath. The sheath was flushed followed by installation of standard radial cocktail. A 5 French MG2 glide catheter and Bentson wire were directed under fluoroscopic visualization through the left upper extremity, around the aortic arch, through the descending thoracic aorta and into the abdominal aorta.  The catheter was then directed into the right internal iliac artery. Right internal iliac artery angiogram demonstrated patency of the anterior and posterior divisions with the prostatic artery appearing to arise from the internal pudendal artery. A 1.9 French Progreat lambda microcatheter and standard 0.014 inch air Stahl wire within directed into the right internal pudendal artery. Angiogram was performed which demonstrated patency and origination of the right prostatic artery. The right prostatic artery was then selected and right prostatic artery angiogram was performed. Cone beam CT was then performed which demonstrated a proximal inferior rectal branch about the prostatic artery. There are no additional evidence of nontarget branch vessels. The inferior rectal artery was then selected and angiogram was performed which demonstrated no evidence of nontarget branch vessels. Next, proximal embolization of the right inferior rectal artery was performed with a single 1 mm x 5 mm low profile Ruby detachable coil. The catheter was retracted into the parent vessel and injection of contrast demonstrated adequate embolization and preserved patency of the right prostatic artery. The microcatheter was then  directed deeper into the distal prostatic artery and particle embolization with dilute 400 micron hydro pleural see years was performed until stasis was achieved. The catheter was then retracted into the proximal prostatic artery and completion arteriogram demonstrated adequate embolization of the prostatic parenchyma. The microcatheter was then removed. The 5 French base catheter was then directed under fluoroscopic guidance to the left internal iliac artery. Angiogram was performed which demonstrated patency of the posterior and anterior divisions with the prostatic artery arising from the mid obturator artery. The obturator artery was then selected with the straight lambda Progreat microcatheter and Aristotle 14 microwire. Angiogram was performed in different obliquities to display the origin of the prostatic artery. There were several unsuccessful attempts with cannulating the prostatic artery including use of a fathom 14 microwire. Therefore, the catheter was exchanged for a triple angle lambda microcatheter which was then able to successfully select the prostatic artery origin with wireless technique. Angiogram was performed of the prostatic artery which demonstrated opacification of the prostate and no evidence of nontarget branch vessels. There was some spasm within the prostatic artery, therefore 200 mcg of nitroglycerin was administered intra-arterially. Next, particle embolization of the left hemi prostate was performed with 400 micron hydro pearls. Due to the spasm visualized on prior arteriogram, the proximal prostatic artery was embolized with several low-profile Ruby detachable microcoils to ensure complete embolization. The catheter was retracted into the obturator artery and completion angiogram was performed which demonstrated appropriate and complete embolization of the left prostatic artery. The catheters were removed. A TR band was placed about the left wrist and inflated than the indwelling  slender sheath was removed successfully. The patient tolerated the procedure well was transferred back to the floor in good condition. IMPRESSION: 1. Technically successful particle embolization of the right prostatic artery. 2. Shared origin of the right prostatic artery and right inferior rectal artery necessitating coil embolization of the right inferior rectal artery for embolic protection purposes. 3. Technically successful particle and coil embolization of the left prostatic artery. Marliss Coots, MD [facility] Radiology Electronically Signed   By: Marliss Coots M.D.   On: 01/17/2023 07:28   IR Angiogram Selective Each Additional Vessel  Result Date: 01/17/2023 INDICATION: 77 year old male with history of prostatic hematuria, currently admitted as an inpatient. EXAM: 1. Ultrasound-guided vascular access of the left radial artery. 2. Selective catheterization and angiography of the right internal iliac, right internal pudendal,  right inferior rectal, right prostatic, left common iliac, left internal iliac, left obturator, and left prostatic arteries. 3. Cone beam CT. 4. Coil embolization of right inferior rectal artery. 5. Right prostatic artery embolization. 6. Left prostatic artery embolization. MEDICATIONS: 200 mg ciprofloxacin, intravenous ANESTHESIA/SEDATION: Moderate (conscious) sedation was employed during this procedure. A total of Versed 2.5 mg and Fentanyl 100 mcg was administered intravenously. Moderate Sedation Time: 103 minutes. The patient's level of consciousness and vital signs were monitored continuously by radiology nursing throughout the procedure under my direct supervision. CONTRAST:  20 mL Omnipaque 300 FLUOROSCOPY: Radiation Exposure Index (as provided by the fluoroscopic device): 2,819 mGy Kerma COMPLICATIONS: None immediate. PROCEDURE: Informed consent was obtained from the patient following explanation of the procedure, risks,  benefits and alternatives. The patient understands, agrees and consents for the procedure. All questions were addressed. A time out was performed prior to the initiation of the procedure. Maximal barrier sterile technique utilized including caps, mask, sterile gowns, sterile gloves, large sterile drape, hand hygiene, and Betadine prep. The left wrist was prepped and draped in standard fashion. Pulse oximeter was attached to the left thumb. Tora Perches test was performed, grade A. The left radial artery measured 0.4 cm in diameter. Subdermal Local anesthesia was provided at the planned needle entry site with 1% lidocaine. A small skin nick was made. Under direct ultrasound visualization, the left radial artery was punctured with a 21 gauge micropuncture needle. A permanent image was captured and stored in the record. A microwire was placed and exchanged for a 4/5 French slender sheath. The sheath was flushed followed by installation of standard radial cocktail. A 5 French MG2 glide catheter and Bentson wire were directed under fluoroscopic visualization through the left upper extremity, around the aortic arch, through the descending thoracic aorta and into the abdominal aorta. The catheter was then directed into the right internal iliac artery. Right internal iliac artery angiogram demonstrated patency of the anterior and posterior divisions with the prostatic artery appearing to arise from the internal pudendal artery. A 1.9 French Progreat lambda microcatheter and standard 0.014 inch air Stahl wire within directed into the right internal pudendal artery. Angiogram was performed which demonstrated patency and origination of the right prostatic artery. The right prostatic artery was then selected and right prostatic artery angiogram was performed. Cone beam CT was then performed which demonstrated a proximal inferior rectal branch about the prostatic artery. There are no additional evidence of nontarget branch vessels.  The inferior rectal artery was then selected and angiogram was performed which demonstrated no evidence of nontarget branch vessels. Next, proximal embolization of the right inferior rectal artery was performed with a single 1 mm x 5 mm low profile Ruby detachable coil. The catheter was retracted into the parent vessel and injection of contrast demonstrated adequate embolization and preserved patency of the right prostatic artery. The microcatheter was then directed deeper into the distal prostatic artery and particle embolization with dilute 400 micron hydro pleural see years was performed until stasis was achieved. The catheter was then retracted into the proximal prostatic artery and completion arteriogram demonstrated adequate embolization of the prostatic parenchyma. The microcatheter was then removed. The 5 French base catheter was then directed under fluoroscopic guidance to the left internal iliac artery. Angiogram was performed which demonstrated patency of the posterior and anterior divisions with the prostatic artery arising from the mid obturator artery. The obturator artery was then selected with the straight lambda Progreat microcatheter and Aristotle 14 microwire. Angiogram was performed  in different obliquities to display the origin of the prostatic artery. There were several unsuccessful attempts with cannulating the prostatic artery including use of a fathom 14 microwire. Therefore, the catheter was exchanged for a triple angle lambda microcatheter which was then able to successfully select the prostatic artery origin with wireless technique. Angiogram was performed of the prostatic artery which demonstrated opacification of the prostate and no evidence of nontarget branch vessels. There was some spasm within the prostatic artery, therefore 200 mcg of nitroglycerin was administered intra-arterially. Next, particle embolization of the left hemi prostate was performed with 400 micron hydro pearls.  Due to the spasm visualized on prior arteriogram, the proximal prostatic artery was embolized with several low-profile Ruby detachable microcoils to ensure complete embolization. The catheter was retracted into the obturator artery and completion angiogram was performed which demonstrated appropriate and complete embolization of the left prostatic artery. The catheters were removed. A TR band was placed about the left wrist and inflated than the indwelling slender sheath was removed successfully. The patient tolerated the procedure well was transferred back to the floor in good condition. IMPRESSION: 1. Technically successful particle embolization of the right prostatic artery. 2. Shared origin of the right prostatic artery and right inferior rectal artery necessitating coil embolization of the right inferior rectal artery for embolic protection purposes. 3. Technically successful particle and coil embolization of the left prostatic artery. Marliss Coots, MD [facility] Radiology Electronically Signed   By: Marliss Coots M.D.   On: 01/17/2023 07:28   IR Angiogram Selective Each Additional Vessel  Result Date: 01/17/2023 INDICATION: 77 year old male with history of prostatic hematuria, currently admitted as an inpatient. EXAM: 1. Ultrasound-guided vascular access of the left radial artery. 2. Selective catheterization and angiography of the right internal iliac, right internal pudendal, right inferior rectal, right prostatic, left common iliac, left internal iliac, left obturator, and left prostatic arteries. 3. Cone beam CT. 4. Coil embolization of right inferior rectal artery. 5. Right prostatic artery embolization. 6. Left prostatic artery embolization. MEDICATIONS: 200 mg ciprofloxacin, intravenous ANESTHESIA/SEDATION: Moderate (conscious) sedation was employed during this procedure. A total of Versed 2.5 mg and Fentanyl 100 mcg was administered intravenously.  Moderate Sedation Time: 103 minutes. The patient's level of consciousness and vital signs were monitored continuously by radiology nursing throughout the procedure under my direct supervision. CONTRAST:  20 mL Omnipaque 300 FLUOROSCOPY: Radiation Exposure Index (as provided by the fluoroscopic device): 2,819 mGy Kerma COMPLICATIONS: None immediate. PROCEDURE: Informed consent was obtained from the patient following explanation of the procedure, risks, benefits and alternatives. The patient understands, agrees and consents for the procedure. All questions were addressed. A time out was performed prior to the initiation of the procedure. Maximal barrier sterile technique utilized including caps, mask, sterile gowns, sterile gloves, large sterile drape, hand hygiene, and Betadine prep. The left wrist was prepped and draped in standard fashion. Pulse oximeter was attached to the left thumb. Tora Perches test was performed, grade A. The left radial artery measured 0.4 cm in diameter. Subdermal Local anesthesia was provided at the planned needle entry site with 1% lidocaine. A small skin nick was made. Under direct ultrasound visualization, the left radial artery was punctured with a 21 gauge micropuncture needle. A permanent image was captured and stored in the record. A microwire was placed and exchanged for a 4/5 French slender sheath. The sheath was flushed followed by installation of standard radial cocktail. A 5 French MG2 glide catheter and Bentson wire  were directed under fluoroscopic visualization through the left upper extremity, around the aortic arch, through the descending thoracic aorta and into the abdominal aorta. The catheter was then directed into the right internal iliac artery. Right internal iliac artery angiogram demonstrated patency of the anterior and posterior divisions with the prostatic artery appearing to arise from the internal pudendal artery. A 1.9 French Progreat lambda microcatheter and  standard 0.014 inch air Stahl wire within directed into the right internal pudendal artery. Angiogram was performed which demonstrated patency and origination of the right prostatic artery. The right prostatic artery was then selected and right prostatic artery angiogram was performed. Cone beam CT was then performed which demonstrated a proximal inferior rectal branch about the prostatic artery. There are no additional evidence of nontarget branch vessels. The inferior rectal artery was then selected and angiogram was performed which demonstrated no evidence of nontarget branch vessels. Next, proximal embolization of the right inferior rectal artery was performed with a single 1 mm x 5 mm low profile Ruby detachable coil. The catheter was retracted into the parent vessel and injection of contrast demonstrated adequate embolization and preserved patency of the right prostatic artery. The microcatheter was then directed deeper into the distal prostatic artery and particle embolization with dilute 400 micron hydro pleural see years was performed until stasis was achieved. The catheter was then retracted into the proximal prostatic artery and completion arteriogram demonstrated adequate embolization of the prostatic parenchyma. The microcatheter was then removed. The 5 French base catheter was then directed under fluoroscopic guidance to the left internal iliac artery. Angiogram was performed which demonstrated patency of the posterior and anterior divisions with the prostatic artery arising from the mid obturator artery. The obturator artery was then selected with the straight lambda Progreat microcatheter and Aristotle 14 microwire. Angiogram was performed in different obliquities to display the origin of the prostatic artery. There were several unsuccessful attempts with cannulating the prostatic artery including use of a fathom 14 microwire. Therefore, the catheter was exchanged for a triple angle lambda  microcatheter which was then able to successfully select the prostatic artery origin with wireless technique. Angiogram was performed of the prostatic artery which demonstrated opacification of the prostate and no evidence of nontarget branch vessels. There was some spasm within the prostatic artery, therefore 200 mcg of nitroglycerin was administered intra-arterially. Next, particle embolization of the left hemi prostate was performed with 400 micron hydro pearls. Due to the spasm visualized on prior arteriogram, the proximal prostatic artery was embolized with several low-profile Ruby detachable microcoils to ensure complete embolization. The catheter was retracted into the obturator artery and completion angiogram was performed which demonstrated appropriate and complete embolization of the left prostatic artery. The catheters were removed. A TR band was placed about the left wrist and inflated than the indwelling slender sheath was removed successfully. The patient tolerated the procedure well was transferred back to the floor in good condition. IMPRESSION: 1. Technically successful particle embolization of the right prostatic artery. 2. Shared origin of the right prostatic artery and right inferior rectal artery necessitating coil embolization of the right inferior rectal artery for embolic protection purposes. 3. Technically successful particle and coil embolization of the left prostatic artery. Marliss Coots, MD [facility] Radiology Electronically Signed   By: Marliss Coots M.D.   On: 01/17/2023 07:28   IR CT PELVIS W/CM  Result Date: 01/17/2023 INDICATION: 77 year old male with history of prostatic hematuria, currently admitted as an inpatient. EXAM:  1. Ultrasound-guided vascular access of the left radial artery. 2. Selective catheterization and angiography of the right internal iliac, right internal pudendal, right inferior rectal, right prostatic, left  common iliac, left internal iliac, left obturator, and left prostatic arteries. 3. Cone beam CT. 4. Coil embolization of right inferior rectal artery. 5. Right prostatic artery embolization. 6. Left prostatic artery embolization. MEDICATIONS: 200 mg ciprofloxacin, intravenous ANESTHESIA/SEDATION: Moderate (conscious) sedation was employed during this procedure. A total of Versed 2.5 mg and Fentanyl 100 mcg was administered intravenously. Moderate Sedation Time: 103 minutes. The patient's level of consciousness and vital signs were monitored continuously by radiology nursing throughout the procedure under my direct supervision. CONTRAST:  20 mL Omnipaque 300 FLUOROSCOPY: Radiation Exposure Index (as provided by the fluoroscopic device): 2,819 mGy Kerma COMPLICATIONS: None immediate. PROCEDURE: Informed consent was obtained from the patient following explanation of the procedure, risks, benefits and alternatives. The patient understands, agrees and consents for the procedure. All questions were addressed. A time out was performed prior to the initiation of the procedure. Maximal barrier sterile technique utilized including caps, mask, sterile gowns, sterile gloves, large sterile drape, hand hygiene, and Betadine prep. The left wrist was prepped and draped in standard fashion. Pulse oximeter was attached to the left thumb. Tora Perches test was performed, grade A. The left radial artery measured 0.4 cm in diameter. Subdermal Local anesthesia was provided at the planned needle entry site with 1% lidocaine. A small skin nick was made. Under direct ultrasound visualization, the left radial artery was punctured with a 21 gauge micropuncture needle. A permanent image was captured and stored in the record. A microwire was placed and exchanged for a 4/5 French slender sheath. The sheath was flushed followed by installation of standard radial cocktail. A 5 French MG2 glide catheter and Bentson wire were directed under fluoroscopic  visualization through the left upper extremity, around the aortic arch, through the descending thoracic aorta and into the abdominal aorta. The catheter was then directed into the right internal iliac artery. Right internal iliac artery angiogram demonstrated patency of the anterior and posterior divisions with the prostatic artery appearing to arise from the internal pudendal artery. A 1.9 French Progreat lambda microcatheter and standard 0.014 inch air Stahl wire within directed into the right internal pudendal artery. Angiogram was performed which demonstrated patency and origination of the right prostatic artery. The right prostatic artery was then selected and right prostatic artery angiogram was performed. Cone beam CT was then performed which demonstrated a proximal inferior rectal branch about the prostatic artery. There are no additional evidence of nontarget branch vessels. The inferior rectal artery was then selected and angiogram was performed which demonstrated no evidence of nontarget branch vessels. Next, proximal embolization of the right inferior rectal artery was performed with a single 1 mm x 5 mm low profile Ruby detachable coil. The catheter was retracted into the parent vessel and injection of contrast demonstrated adequate embolization and preserved patency of the right prostatic artery. The microcatheter was then directed deeper into the distal prostatic artery and particle embolization with dilute 400 micron hydro pleural see years was performed until stasis was achieved. The catheter was then retracted into the proximal prostatic artery and completion arteriogram demonstrated adequate embolization of the prostatic parenchyma. The microcatheter was then removed. The 5 French base catheter was then directed under fluoroscopic guidance to the left internal iliac artery. Angiogram was performed which demonstrated patency of the posterior and anterior divisions with the prostatic artery arising  from  the mid obturator artery. The obturator artery was then selected with the straight lambda Progreat microcatheter and Aristotle 14 microwire. Angiogram was performed in different obliquities to display the origin of the prostatic artery. There were several unsuccessful attempts with cannulating the prostatic artery including use of a fathom 14 microwire. Therefore, the catheter was exchanged for a triple angle lambda microcatheter which was then able to successfully select the prostatic artery origin with wireless technique. Angiogram was performed of the prostatic artery which demonstrated opacification of the prostate and no evidence of nontarget branch vessels. There was some spasm within the prostatic artery, therefore 200 mcg of nitroglycerin was administered intra-arterially. Next, particle embolization of the left hemi prostate was performed with 400 micron hydro pearls. Due to the spasm visualized on prior arteriogram, the proximal prostatic artery was embolized with several low-profile Ruby detachable microcoils to ensure complete embolization. The catheter was retracted into the obturator artery and completion angiogram was performed which demonstrated appropriate and complete embolization of the left prostatic artery. The catheters were removed. A TR band was placed about the left wrist and inflated than the indwelling slender sheath was removed successfully. The patient tolerated the procedure well was transferred back to the floor in good condition. IMPRESSION: 1. Technically successful particle embolization of the right prostatic artery. 2. Shared origin of the right prostatic artery and right inferior rectal artery necessitating coil embolization of the right inferior rectal artery for embolic protection purposes. 3. Technically successful particle and coil embolization of the left prostatic artery. Marliss Coots, MD [facility] Radiology  Electronically Signed   By: Marliss Coots M.D.   On: 01/17/2023 07:28   IR Angiogram Selective Each Additional Vessel  Result Date: 01/17/2023 INDICATION: 77 year old male with history of prostatic hematuria, currently admitted as an inpatient. EXAM: 1. Ultrasound-guided vascular access of the left radial artery. 2. Selective catheterization and angiography of the right internal iliac, right internal pudendal, right inferior rectal, right prostatic, left common iliac, left internal iliac, left obturator, and left prostatic arteries. 3. Cone beam CT. 4. Coil embolization of right inferior rectal artery. 5. Right prostatic artery embolization. 6. Left prostatic artery embolization. MEDICATIONS: 200 mg ciprofloxacin, intravenous ANESTHESIA/SEDATION: Moderate (conscious) sedation was employed during this procedure. A total of Versed 2.5 mg and Fentanyl 100 mcg was administered intravenously. Moderate Sedation Time: 103 minutes. The patient's level of consciousness and vital signs were monitored continuously by radiology nursing throughout the procedure under my direct supervision. CONTRAST:  20 mL Omnipaque 300 FLUOROSCOPY: Radiation Exposure Index (as provided by the fluoroscopic device): 2,819 mGy Kerma COMPLICATIONS: None immediate. PROCEDURE: Informed consent was obtained from the patient following explanation of the procedure, risks, benefits and alternatives. The patient understands, agrees and consents for the procedure. All questions were addressed. A time out was performed prior to the initiation of the procedure. Maximal barrier sterile technique utilized including caps, mask, sterile gowns, sterile gloves, large sterile drape, hand hygiene, and Betadine prep. The left wrist was prepped and draped in standard fashion. Pulse oximeter was attached to the left thumb. Tora Perches test was performed, grade A. The left radial artery measured 0.4 cm in diameter. Subdermal Local anesthesia was provided at the planned  needle entry site with 1% lidocaine. A small skin nick was made. Under direct ultrasound visualization, the left radial artery was punctured with a 21 gauge micropuncture needle. A permanent image was captured and stored in the record. A microwire was placed and exchanged for a 4/5  French slender sheath. The sheath was flushed followed by installation of standard radial cocktail. A 5 French MG2 glide catheter and Bentson wire were directed under fluoroscopic visualization through the left upper extremity, around the aortic arch, through the descending thoracic aorta and into the abdominal aorta. The catheter was then directed into the right internal iliac artery. Right internal iliac artery angiogram demonstrated patency of the anterior and posterior divisions with the prostatic artery appearing to arise from the internal pudendal artery. A 1.9 French Progreat lambda microcatheter and standard 0.014 inch air Stahl wire within directed into the right internal pudendal artery. Angiogram was performed which demonstrated patency and origination of the right prostatic artery. The right prostatic artery was then selected and right prostatic artery angiogram was performed. Cone beam CT was then performed which demonstrated a proximal inferior rectal branch about the prostatic artery. There are no additional evidence of nontarget branch vessels. The inferior rectal artery was then selected and angiogram was performed which demonstrated no evidence of nontarget branch vessels. Next, proximal embolization of the right inferior rectal artery was performed with a single 1 mm x 5 mm low profile Ruby detachable coil. The catheter was retracted into the parent vessel and injection of contrast demonstrated adequate embolization and preserved patency of the right prostatic artery. The microcatheter was then directed deeper into the distal prostatic artery and particle embolization with dilute 400 micron hydro pleural see years was  performed until stasis was achieved. The catheter was then retracted into the proximal prostatic artery and completion arteriogram demonstrated adequate embolization of the prostatic parenchyma. The microcatheter was then removed. The 5 French base catheter was then directed under fluoroscopic guidance to the left internal iliac artery. Angiogram was performed which demonstrated patency of the posterior and anterior divisions with the prostatic artery arising from the mid obturator artery. The obturator artery was then selected with the straight lambda Progreat microcatheter and Aristotle 14 microwire. Angiogram was performed in different obliquities to display the origin of the prostatic artery. There were several unsuccessful attempts with cannulating the prostatic artery including use of a fathom 14 microwire. Therefore, the catheter was exchanged for a triple angle lambda microcatheter which was then able to successfully select the prostatic artery origin with wireless technique. Angiogram was performed of the prostatic artery which demonstrated opacification of the prostate and no evidence of nontarget branch vessels. There was some spasm within the prostatic artery, therefore 200 mcg of nitroglycerin was administered intra-arterially. Next, particle embolization of the left hemi prostate was performed with 400 micron hydro pearls. Due to the spasm visualized on prior arteriogram, the proximal prostatic artery was embolized with several low-profile Ruby detachable microcoils to ensure complete embolization. The catheter was retracted into the obturator artery and completion angiogram was performed which demonstrated appropriate and complete embolization of the left prostatic artery. The catheters were removed. A TR band was placed about the left wrist and inflated than the indwelling slender sheath was removed successfully. The patient tolerated the procedure well was transferred back to the floor in good  condition. IMPRESSION: 1. Technically successful particle embolization of the right prostatic artery. 2. Shared origin of the right prostatic artery and right inferior rectal artery necessitating coil embolization of the right inferior rectal artery for embolic protection purposes. 3. Technically successful particle and coil embolization of the left prostatic artery. Marliss Coots, MD [facility] Radiology Electronically Signed   By: Marliss Coots M.D.   On: 01/17/2023 07:28  IR Angiogram Selective Each Additional Vessel  Result Date: 01/17/2023 INDICATION: 77 year old male with history of prostatic hematuria, currently admitted as an inpatient. EXAM: 1. Ultrasound-guided vascular access of the left radial artery. 2. Selective catheterization and angiography of the right internal iliac, right internal pudendal, right inferior rectal, right prostatic, left common iliac, left internal iliac, left obturator, and left prostatic arteries. 3. Cone beam CT. 4. Coil embolization of right inferior rectal artery. 5. Right prostatic artery embolization. 6. Left prostatic artery embolization. MEDICATIONS: 200 mg ciprofloxacin, intravenous ANESTHESIA/SEDATION: Moderate (conscious) sedation was employed during this procedure. A total of Versed 2.5 mg and Fentanyl 100 mcg was administered intravenously. Moderate Sedation Time: 103 minutes. The patient's level of consciousness and vital signs were monitored continuously by radiology nursing throughout the procedure under my direct supervision. CONTRAST:  20 mL Omnipaque 300 FLUOROSCOPY: Radiation Exposure Index (as provided by the fluoroscopic device): 2,819 mGy Kerma COMPLICATIONS: None immediate. PROCEDURE: Informed consent was obtained from the patient following explanation of the procedure, risks, benefits and alternatives. The patient understands, agrees and consents for the procedure. All questions were addressed. A time out  was performed prior to the initiation of the procedure. Maximal barrier sterile technique utilized including caps, mask, sterile gowns, sterile gloves, large sterile drape, hand hygiene, and Betadine prep. The left wrist was prepped and draped in standard fashion. Pulse oximeter was attached to the left thumb. Tora Perches test was performed, grade A. The left radial artery measured 0.4 cm in diameter. Subdermal Local anesthesia was provided at the planned needle entry site with 1% lidocaine. A small skin nick was made. Under direct ultrasound visualization, the left radial artery was punctured with a 21 gauge micropuncture needle. A permanent image was captured and stored in the record. A microwire was placed and exchanged for a 4/5 French slender sheath. The sheath was flushed followed by installation of standard radial cocktail. A 5 French MG2 glide catheter and Bentson wire were directed under fluoroscopic visualization through the left upper extremity, around the aortic arch, through the descending thoracic aorta and into the abdominal aorta. The catheter was then directed into the right internal iliac artery. Right internal iliac artery angiogram demonstrated patency of the anterior and posterior divisions with the prostatic artery appearing to arise from the internal pudendal artery. A 1.9 French Progreat lambda microcatheter and standard 0.014 inch air Stahl wire within directed into the right internal pudendal artery. Angiogram was performed which demonstrated patency and origination of the right prostatic artery. The right prostatic artery was then selected and right prostatic artery angiogram was performed. Cone beam CT was then performed which demonstrated a proximal inferior rectal branch about the prostatic artery. There are no additional evidence of nontarget branch vessels. The inferior rectal artery was then selected and angiogram was performed which demonstrated no evidence of nontarget branch vessels.  Next, proximal embolization of the right inferior rectal artery was performed with a single 1 mm x 5 mm low profile Ruby detachable coil. The catheter was retracted into the parent vessel and injection of contrast demonstrated adequate embolization and preserved patency of the right prostatic artery. The microcatheter was then directed deeper into the distal prostatic artery and particle embolization with dilute 400 micron hydro pleural see years was performed until stasis was achieved. The catheter was then retracted into the proximal prostatic artery and completion arteriogram demonstrated adequate embolization of the prostatic parenchyma. The microcatheter was then removed. The 5 French base catheter was then directed under fluoroscopic guidance to  the left internal iliac artery. Angiogram was performed which demonstrated patency of the posterior and anterior divisions with the prostatic artery arising from the mid obturator artery. The obturator artery was then selected with the straight lambda Progreat microcatheter and Aristotle 14 microwire. Angiogram was performed in different obliquities to display the origin of the prostatic artery. There were several unsuccessful attempts with cannulating the prostatic artery including use of a fathom 14 microwire. Therefore, the catheter was exchanged for a triple angle lambda microcatheter which was then able to successfully select the prostatic artery origin with wireless technique. Angiogram was performed of the prostatic artery which demonstrated opacification of the prostate and no evidence of nontarget branch vessels. There was some spasm within the prostatic artery, therefore 200 mcg of nitroglycerin was administered intra-arterially. Next, particle embolization of the left hemi prostate was performed with 400 micron hydro pearls. Due to the spasm visualized on prior arteriogram, the proximal prostatic artery was embolized with several low-profile Ruby detachable  microcoils to ensure complete embolization. The catheter was retracted into the obturator artery and completion angiogram was performed which demonstrated appropriate and complete embolization of the left prostatic artery. The catheters were removed. A TR band was placed about the left wrist and inflated than the indwelling slender sheath was removed successfully. The patient tolerated the procedure well was transferred back to the floor in good condition. IMPRESSION: 1. Technically successful particle embolization of the right prostatic artery. 2. Shared origin of the right prostatic artery and right inferior rectal artery necessitating coil embolization of the right inferior rectal artery for embolic protection purposes. 3. Technically successful particle and coil embolization of the left prostatic artery. Marliss Coots, MD [facility] Radiology Electronically Signed   By: Marliss Coots M.D.   On: 01/17/2023 07:28   IR Angiogram Selective Each Additional Vessel  Result Date: 01/17/2023 INDICATION: 77 year old male with history of prostatic hematuria, currently admitted as an inpatient. EXAM: 1. Ultrasound-guided vascular access of the left radial artery. 2. Selective catheterization and angiography of the right internal iliac, right internal pudendal, right inferior rectal, right prostatic, left common iliac, left internal iliac, left obturator, and left prostatic arteries. 3. Cone beam CT. 4. Coil embolization of right inferior rectal artery. 5. Right prostatic artery embolization. 6. Left prostatic artery embolization. MEDICATIONS: 200 mg ciprofloxacin, intravenous ANESTHESIA/SEDATION: Moderate (conscious) sedation was employed during this procedure. A total of Versed 2.5 mg and Fentanyl 100 mcg was administered intravenously. Moderate Sedation Time: 103 minutes. The patient's level of consciousness and vital signs were monitored continuously by radiology  nursing throughout the procedure under my direct supervision. CONTRAST:  20 mL Omnipaque 300 FLUOROSCOPY: Radiation Exposure Index (as provided by the fluoroscopic device): 2,819 mGy Kerma COMPLICATIONS: None immediate. PROCEDURE: Informed consent was obtained from the patient following explanation of the procedure, risks, benefits and alternatives. The patient understands, agrees and consents for the procedure. All questions were addressed. A time out was performed prior to the initiation of the procedure. Maximal barrier sterile technique utilized including caps, mask, sterile gowns, sterile gloves, large sterile drape, hand hygiene, and Betadine prep. The left wrist was prepped and draped in standard fashion. Pulse oximeter was attached to the left thumb. Tora Perches test was performed, grade A. The left radial artery measured 0.4 cm in diameter. Subdermal Local anesthesia was provided at the planned needle entry site with 1% lidocaine. A small skin nick was made. Under direct ultrasound visualization, the left radial artery was punctured with a  21 gauge micropuncture needle. A permanent image was captured and stored in the record. A microwire was placed and exchanged for a 4/5 French slender sheath. The sheath was flushed followed by installation of standard radial cocktail. A 5 French MG2 glide catheter and Bentson wire were directed under fluoroscopic visualization through the left upper extremity, around the aortic arch, through the descending thoracic aorta and into the abdominal aorta. The catheter was then directed into the right internal iliac artery. Right internal iliac artery angiogram demonstrated patency of the anterior and posterior divisions with the prostatic artery appearing to arise from the internal pudendal artery. A 1.9 French Progreat lambda microcatheter and standard 0.014 inch air Stahl wire within directed into the right internal pudendal artery. Angiogram was performed which demonstrated  patency and origination of the right prostatic artery. The right prostatic artery was then selected and right prostatic artery angiogram was performed. Cone beam CT was then performed which demonstrated a proximal inferior rectal branch about the prostatic artery. There are no additional evidence of nontarget branch vessels. The inferior rectal artery was then selected and angiogram was performed which demonstrated no evidence of nontarget branch vessels. Next, proximal embolization of the right inferior rectal artery was performed with a single 1 mm x 5 mm low profile Ruby detachable coil. The catheter was retracted into the parent vessel and injection of contrast demonstrated adequate embolization and preserved patency of the right prostatic artery. The microcatheter was then directed deeper into the distal prostatic artery and particle embolization with dilute 400 micron hydro pleural see years was performed until stasis was achieved. The catheter was then retracted into the proximal prostatic artery and completion arteriogram demonstrated adequate embolization of the prostatic parenchyma. The microcatheter was then removed. The 5 French base catheter was then directed under fluoroscopic guidance to the left internal iliac artery. Angiogram was performed which demonstrated patency of the posterior and anterior divisions with the prostatic artery arising from the mid obturator artery. The obturator artery was then selected with the straight lambda Progreat microcatheter and Aristotle 14 microwire. Angiogram was performed in different obliquities to display the origin of the prostatic artery. There were several unsuccessful attempts with cannulating the prostatic artery including use of a fathom 14 microwire. Therefore, the catheter was exchanged for a triple angle lambda microcatheter which was then able to successfully select the prostatic artery origin with wireless technique. Angiogram was performed of the  prostatic artery which demonstrated opacification of the prostate and no evidence of nontarget branch vessels. There was some spasm within the prostatic artery, therefore 200 mcg of nitroglycerin was administered intra-arterially. Next, particle embolization of the left hemi prostate was performed with 400 micron hydro pearls. Due to the spasm visualized on prior arteriogram, the proximal prostatic artery was embolized with several low-profile Ruby detachable microcoils to ensure complete embolization. The catheter was retracted into the obturator artery and completion angiogram was performed which demonstrated appropriate and complete embolization of the left prostatic artery. The catheters were removed. A TR band was placed about the left wrist and inflated than the indwelling slender sheath was removed successfully. The patient tolerated the procedure well was transferred back to the floor in good condition. IMPRESSION: 1. Technically successful particle embolization of the right prostatic artery. 2. Shared origin of the right prostatic artery and right inferior rectal artery necessitating coil embolization of the right inferior rectal artery for embolic protection purposes. 3. Technically successful particle and coil embolization of the left prostatic artery. Marliss Coots,  MD [facility] Radiology Electronically Signed   By: Marliss Coots M.D.   On: 01/17/2023 07:28   IR EMBO TUMOR ORGAN ISCHEMIA INFARCT INC GUIDE ROADMAPPING  Result Date: 01/17/2023 INDICATION: 77 year old male with history of prostatic hematuria, currently admitted as an inpatient. EXAM: 1. Ultrasound-guided vascular access of the left radial artery. 2. Selective catheterization and angiography of the right internal iliac, right internal pudendal, right inferior rectal, right prostatic, left common iliac, left internal iliac, left obturator, and left prostatic arteries. 3. Cone beam CT. 4.  Coil embolization of right inferior rectal artery. 5. Right prostatic artery embolization. 6. Left prostatic artery embolization. MEDICATIONS: 200 mg ciprofloxacin, intravenous ANESTHESIA/SEDATION: Moderate (conscious) sedation was employed during this procedure. A total of Versed 2.5 mg and Fentanyl 100 mcg was administered intravenously. Moderate Sedation Time: 103 minutes. The patient's level of consciousness and vital signs were monitored continuously by radiology nursing throughout the procedure under my direct supervision. CONTRAST:  20 mL Omnipaque 300 FLUOROSCOPY: Radiation Exposure Index (as provided by the fluoroscopic device): 2,819 mGy Kerma COMPLICATIONS: None immediate. PROCEDURE: Informed consent was obtained from the patient following explanation of the procedure, risks, benefits and alternatives. The patient understands, agrees and consents for the procedure. All questions were addressed. A time out was performed prior to the initiation of the procedure. Maximal barrier sterile technique utilized including caps, mask, sterile gowns, sterile gloves, large sterile drape, hand hygiene, and Betadine prep. The left wrist was prepped and draped in standard fashion. Pulse oximeter was attached to the left thumb. Tora Perches test was performed, grade A. The left radial artery measured 0.4 cm in diameter. Subdermal Local anesthesia was provided at the planned needle entry site with 1% lidocaine. A small skin nick was made. Under direct ultrasound visualization, the left radial artery was punctured with a 21 gauge micropuncture needle. A permanent image was captured and stored in the record. A microwire was placed and exchanged for a 4/5 French slender sheath. The sheath was flushed followed by installation of standard radial cocktail. A 5 French MG2 glide catheter and Bentson wire were directed under fluoroscopic visualization through the left upper extremity, around the aortic arch, through the descending  thoracic aorta and into the abdominal aorta. The catheter was then directed into the right internal iliac artery. Right internal iliac artery angiogram demonstrated patency of the anterior and posterior divisions with the prostatic artery appearing to arise from the internal pudendal artery. A 1.9 French Progreat lambda microcatheter and standard 0.014 inch air Stahl wire within directed into the right internal pudendal artery. Angiogram was performed which demonstrated patency and origination of the right prostatic artery. The right prostatic artery was then selected and right prostatic artery angiogram was performed. Cone beam CT was then performed which demonstrated a proximal inferior rectal branch about the prostatic artery. There are no additional evidence of nontarget branch vessels. The inferior rectal artery was then selected and angiogram was performed which demonstrated no evidence of nontarget branch vessels. Next, proximal embolization of the right inferior rectal artery was performed with a single 1 mm x 5 mm low profile Ruby detachable coil. The catheter was retracted into the parent vessel and injection of contrast demonstrated adequate embolization and preserved patency of the right prostatic artery. The microcatheter was then directed deeper into the distal prostatic artery and particle embolization with dilute 400 micron hydro pleural see years was performed until stasis was achieved. The catheter was then retracted into the proximal prostatic artery  and completion arteriogram demonstrated adequate embolization of the prostatic parenchyma. The microcatheter was then removed. The 5 French base catheter was then directed under fluoroscopic guidance to the left internal iliac artery. Angiogram was performed which demonstrated patency of the posterior and anterior divisions with the prostatic artery arising from the mid obturator artery. The obturator artery was then selected with the straight lambda  Progreat microcatheter and Aristotle 14 microwire. Angiogram was performed in different obliquities to display the origin of the prostatic artery. There were several unsuccessful attempts with cannulating the prostatic artery including use of a fathom 14 microwire. Therefore, the catheter was exchanged for a triple angle lambda microcatheter which was then able to successfully select the prostatic artery origin with wireless technique. Angiogram was performed of the prostatic artery which demonstrated opacification of the prostate and no evidence of nontarget branch vessels. There was some spasm within the prostatic artery, therefore 200 mcg of nitroglycerin was administered intra-arterially. Next, particle embolization of the left hemi prostate was performed with 400 micron hydro pearls. Due to the spasm visualized on prior arteriogram, the proximal prostatic artery was embolized with several low-profile Ruby detachable microcoils to ensure complete embolization. The catheter was retracted into the obturator artery and completion angiogram was performed which demonstrated appropriate and complete embolization of the left prostatic artery. The catheters were removed. A TR band was placed about the left wrist and inflated than the indwelling slender sheath was removed successfully. The patient tolerated the procedure well was transferred back to the floor in good condition. IMPRESSION: 1. Technically successful particle embolization of the right prostatic artery. 2. Shared origin of the right prostatic artery and right inferior rectal artery necessitating coil embolization of the right inferior rectal artery for embolic protection purposes. 3. Technically successful particle and coil embolization of the left prostatic artery. Marliss Coots, MD [facility] Radiology Electronically Signed   By: Marliss Coots M.D.   On: 01/17/2023 07:28   IR EMBO TUMOR ORGAN ISCHEMIA  INFARCT INC GUIDE ROADMAPPING  Result Date: 01/17/2023 INDICATION: 77 year old male with history of prostatic hematuria, currently admitted as an inpatient. EXAM: 1. Ultrasound-guided vascular access of the left radial artery. 2. Selective catheterization and angiography of the right internal iliac, right internal pudendal, right inferior rectal, right prostatic, left common iliac, left internal iliac, left obturator, and left prostatic arteries. 3. Cone beam CT. 4. Coil embolization of right inferior rectal artery. 5. Right prostatic artery embolization. 6. Left prostatic artery embolization. MEDICATIONS: 200 mg ciprofloxacin, intravenous ANESTHESIA/SEDATION: Moderate (conscious) sedation was employed during this procedure. A total of Versed 2.5 mg and Fentanyl 100 mcg was administered intravenously. Moderate Sedation Time: 103 minutes. The patient's level of consciousness and vital signs were monitored continuously by radiology nursing throughout the procedure under my direct supervision. CONTRAST:  20 mL Omnipaque 300 FLUOROSCOPY: Radiation Exposure Index (as provided by the fluoroscopic device): 2,819 mGy Kerma COMPLICATIONS: None immediate. PROCEDURE: Informed consent was obtained from the patient following explanation of the procedure, risks, benefits and alternatives. The patient understands, agrees and consents for the procedure. All questions were addressed. A time out was performed prior to the initiation of the procedure. Maximal barrier sterile technique utilized including caps, mask, sterile gowns, sterile gloves, large sterile drape, hand hygiene, and Betadine prep. The left wrist was prepped and draped in standard fashion. Pulse oximeter was attached to the left thumb. Tora Perches test was performed, grade A. The left radial artery measured 0.4 cm in diameter. Subdermal Local  anesthesia was provided at the planned needle entry site with 1% lidocaine. A small skin nick was made. Under direct ultrasound  visualization, the left radial artery was punctured with a 21 gauge micropuncture needle. A permanent image was captured and stored in the record. A microwire was placed and exchanged for a 4/5 French slender sheath. The sheath was flushed followed by installation of standard radial cocktail. A 5 French MG2 glide catheter and Bentson wire were directed under fluoroscopic visualization through the left upper extremity, around the aortic arch, through the descending thoracic aorta and into the abdominal aorta. The catheter was then directed into the right internal iliac artery. Right internal iliac artery angiogram demonstrated patency of the anterior and posterior divisions with the prostatic artery appearing to arise from the internal pudendal artery. A 1.9 French Progreat lambda microcatheter and standard 0.014 inch air Stahl wire within directed into the right internal pudendal artery. Angiogram was performed which demonstrated patency and origination of the right prostatic artery. The right prostatic artery was then selected and right prostatic artery angiogram was performed. Cone beam CT was then performed which demonstrated a proximal inferior rectal branch about the prostatic artery. There are no additional evidence of nontarget branch vessels. The inferior rectal artery was then selected and angiogram was performed which demonstrated no evidence of nontarget branch vessels. Next, proximal embolization of the right inferior rectal artery was performed with a single 1 mm x 5 mm low profile Ruby detachable coil. The catheter was retracted into the parent vessel and injection of contrast demonstrated adequate embolization and preserved patency of the right prostatic artery. The microcatheter was then directed deeper into the distal prostatic artery and particle embolization with dilute 400 micron hydro pleural see years was performed until stasis was achieved. The catheter was then retracted into the proximal  prostatic artery and completion arteriogram demonstrated adequate embolization of the prostatic parenchyma. The microcatheter was then removed. The 5 French base catheter was then directed under fluoroscopic guidance to the left internal iliac artery. Angiogram was performed which demonstrated patency of the posterior and anterior divisions with the prostatic artery arising from the mid obturator artery. The obturator artery was then selected with the straight lambda Progreat microcatheter and Aristotle 14 microwire. Angiogram was performed in different obliquities to display the origin of the prostatic artery. There were several unsuccessful attempts with cannulating the prostatic artery including use of a fathom 14 microwire. Therefore, the catheter was exchanged for a triple angle lambda microcatheter which was then able to successfully select the prostatic artery origin with wireless technique. Angiogram was performed of the prostatic artery which demonstrated opacification of the prostate and no evidence of nontarget branch vessels. There was some spasm within the prostatic artery, therefore 200 mcg of nitroglycerin was administered intra-arterially. Next, particle embolization of the left hemi prostate was performed with 400 micron hydro pearls. Due to the spasm visualized on prior arteriogram, the proximal prostatic artery was embolized with several low-profile Ruby detachable microcoils to ensure complete embolization. The catheter was retracted into the obturator artery and completion angiogram was performed which demonstrated appropriate and complete embolization of the left prostatic artery. The catheters were removed. A TR band was placed about the left wrist and inflated than the indwelling slender sheath was removed successfully. The patient tolerated the procedure well was transferred back to the floor in good condition. IMPRESSION: 1. Technically successful particle embolization of the right  prostatic artery. 2. Shared origin of the right prostatic artery and  right inferior rectal artery necessitating coil embolization of the right inferior rectal artery for embolic protection purposes. 3. Technically successful particle and coil embolization of the left prostatic artery. Marliss Coots, MD [facility] Radiology Electronically Signed   By: Marliss Coots M.D.   On: 01/17/2023 07:28   IR Angiogram Selective Each Additional Vessel  Result Date: 01/17/2023 INDICATION: 77 year old male with history of prostatic hematuria, currently admitted as an inpatient. EXAM: 1. Ultrasound-guided vascular access of the left radial artery. 2. Selective catheterization and angiography of the right internal iliac, right internal pudendal, right inferior rectal, right prostatic, left common iliac, left internal iliac, left obturator, and left prostatic arteries. 3. Cone beam CT. 4. Coil embolization of right inferior rectal artery. 5. Right prostatic artery embolization. 6. Left prostatic artery embolization. MEDICATIONS: 200 mg ciprofloxacin, intravenous ANESTHESIA/SEDATION: Moderate (conscious) sedation was employed during this procedure. A total of Versed 2.5 mg and Fentanyl 100 mcg was administered intravenously. Moderate Sedation Time: 103 minutes. The patient's level of consciousness and vital signs were monitored continuously by radiology nursing throughout the procedure under my direct supervision. CONTRAST:  20 mL Omnipaque 300 FLUOROSCOPY: Radiation Exposure Index (as provided by the fluoroscopic device): 2,819 mGy Kerma COMPLICATIONS: None immediate. PROCEDURE: Informed consent was obtained from the patient following explanation of the procedure, risks, benefits and alternatives. The patient understands, agrees and consents for the procedure. All questions were addressed. A time out was performed prior to the initiation of the procedure. Maximal barrier sterile  technique utilized including caps, mask, sterile gowns, sterile gloves, large sterile drape, hand hygiene, and Betadine prep. The left wrist was prepped and draped in standard fashion. Pulse oximeter was attached to the left thumb. Tora Perches test was performed, grade A. The left radial artery measured 0.4 cm in diameter. Subdermal Local anesthesia was provided at the planned needle entry site with 1% lidocaine. A small skin nick was made. Under direct ultrasound visualization, the left radial artery was punctured with a 21 gauge micropuncture needle. A permanent image was captured and stored in the record. A microwire was placed and exchanged for a 4/5 French slender sheath. The sheath was flushed followed by installation of standard radial cocktail. A 5 French MG2 glide catheter and Bentson wire were directed under fluoroscopic visualization through the left upper extremity, around the aortic arch, through the descending thoracic aorta and into the abdominal aorta. The catheter was then directed into the right internal iliac artery. Right internal iliac artery angiogram demonstrated patency of the anterior and posterior divisions with the prostatic artery appearing to arise from the internal pudendal artery. A 1.9 French Progreat lambda microcatheter and standard 0.014 inch air Stahl wire within directed into the right internal pudendal artery. Angiogram was performed which demonstrated patency and origination of the right prostatic artery. The right prostatic artery was then selected and right prostatic artery angiogram was performed. Cone beam CT was then performed which demonstrated a proximal inferior rectal branch about the prostatic artery. There are no additional evidence of nontarget branch vessels. The inferior rectal artery was then selected and angiogram was performed which demonstrated no evidence of nontarget branch vessels. Next, proximal embolization of the right inferior rectal artery was performed  with a single 1 mm x 5 mm low profile Ruby detachable coil. The catheter was retracted into the parent vessel and injection of contrast demonstrated adequate embolization and preserved patency of the right prostatic artery. The microcatheter was then directed deeper into the distal prostatic  artery and particle embolization with dilute 400 micron hydro pleural see years was performed until stasis was achieved. The catheter was then retracted into the proximal prostatic artery and completion arteriogram demonstrated adequate embolization of the prostatic parenchyma. The microcatheter was then removed. The 5 French base catheter was then directed under fluoroscopic guidance to the left internal iliac artery. Angiogram was performed which demonstrated patency of the posterior and anterior divisions with the prostatic artery arising from the mid obturator artery. The obturator artery was then selected with the straight lambda Progreat microcatheter and Aristotle 14 microwire. Angiogram was performed in different obliquities to display the origin of the prostatic artery. There were several unsuccessful attempts with cannulating the prostatic artery including use of a fathom 14 microwire. Therefore, the catheter was exchanged for a triple angle lambda microcatheter which was then able to successfully select the prostatic artery origin with wireless technique. Angiogram was performed of the prostatic artery which demonstrated opacification of the prostate and no evidence of nontarget branch vessels. There was some spasm within the prostatic artery, therefore 200 mcg of nitroglycerin was administered intra-arterially. Next, particle embolization of the left hemi prostate was performed with 400 micron hydro pearls. Due to the spasm visualized on prior arteriogram, the proximal prostatic artery was embolized with several low-profile Ruby detachable microcoils to ensure complete embolization. The catheter was retracted into  the obturator artery and completion angiogram was performed which demonstrated appropriate and complete embolization of the left prostatic artery. The catheters were removed. A TR band was placed about the left wrist and inflated than the indwelling slender sheath was removed successfully. The patient tolerated the procedure well was transferred back to the floor in good condition. IMPRESSION: 1. Technically successful particle embolization of the right prostatic artery. 2. Shared origin of the right prostatic artery and right inferior rectal artery necessitating coil embolization of the right inferior rectal artery for embolic protection purposes. 3. Technically successful particle and coil embolization of the left prostatic artery. Marliss Coots, MD [facility] Inter[facility] Radiology Specialists [facility] Radiology Electronically Signed   By: Marliss Coots M.D.   On: 01/17/2023 07:28    Assessment/Plan: S/p prostate embo via (L)radial approach. No apparent further bleeding.    LOS: 9 days   I spent a total of 15 minutes in face to face in clinical consultation, greater than 50% of which was counseling/coordinating care for prostate Assunta Gambles PA-C 01/17/2023 11:57 AM

## 2023-01-17 NOTE — Progress Notes (Signed)
Mobility Specialist - Progress Note   01/17/23 1134  Mobility  Activity Ambulated with assistance to bathroom  Level of Assistance Moderate assist, patient does 50-74%  Assistive Device Front wheel walker  Distance Ambulated (ft) 20 ft  Range of Motion/Exercises Active Assistive  Activity Response Tolerated fair  Mobility Referral Yes  $Mobility charge 1 Mobility  Mobility Specialist Start Time (ACUTE ONLY) 1100  Mobility Specialist Stop Time (ACUTE ONLY) 1134  Mobility Specialist Time Calculation (min) (ACUTE ONLY) 34 min   Pt was found in bed and agreeable to try ambulation. Pt was mod-A for bed mobility. Pt was able to ambulate to bathroom and afterwards took a seated rest break on the bed to drink water. Pt stated feeling to weak and exhausted to ambulate in hallway. Pt agreeable to sit on recliner chair. Pt was left on recliner chair with all needs met. Call bell in reach and RN notified.  Billey Chang Mobility Specialist

## 2023-01-17 NOTE — Consult Note (Signed)
   [facility] Inpatient Consult   01/17/2023  CHARLES BRINKERHOFF [birth year] [id number]  [facility] [[facility]]  Accountable Care Organization [ACO] Patient: Calvin [name]   [facility] Liaison remote coverage review for patient admitted to [facility]   Primary Care Provider:  [name], MD with [facility]   Patient screened for less than 7 days readmission hospitalization with noted rising  risk score for unplanned readmission risk with a 9 days length of stay and to assess for potential [facility]  [[facility]] Care Management service needs for post hospital transition for care coordination.  Review of patient's electronic medical record reveals patient is admitted with an indwelling catheter-associated UTI [suprapubic catheter] SIRS. HX of new Atrial Fib with follow up needed noted due to readmission. Reviewed and call attempted to bedside phone currently not answered.  Plan:  Continue to follow progress and disposition to assess for post hospital community care coordination/management needs.  Referral request for community care coordination: anticipate follow up with disposition needs with ongoing work with therapy.  Of note, [facility] Care Management/Population Health does not replace or interfere with any arrangements made by the Inpatient Transition of Care team.  For questions contact:   Charlesetta Shanks, RN BSN CCM [facility]  [phone] business mobile phone Toll free office [phone]  *Concierge Line  [phone] Fax number: [phone] [email or web address]

## 2023-01-17 NOTE — Progress Notes (Addendum)
Physical Therapy Treatment Patient Details Name: Calvin [name] [name]: [id number] DOB: 1945/08/25 Today's Date: 01/17/2023   History of Present Illness 77 y.o. male admitted 01/07/23 with sepsis, UTI associated with suprapubic catheter, urethral bleeding. PMH significant for HTN, HLD, ischemic stroke 12/23, BPH, neurogenic bladder, suprapubic catheter in place, anxiety/depression.    PT Comments  Pt reported he's too fatigued to attempt mobility at present. Benefits of mobility and risks of bedrest were explained to pt. He was agreeable to bed level exercises only. Pt did ambulate 20' with mobility specialist earlier today. Patient will benefit from continued inpatient follow up therapy, <3 hours/day.      If plan is discharge home, recommend the following: A little help with walking and/or transfers;A little help with bathing/dressing/bathroom;Help with stairs or ramp for entrance;Assistance with cooking/housework   Can travel by private vehicle        Equipment Recommendations  None recommended by PT    Recommendations for Other Services       Precautions / Restrictions Precautions Precautions: Fall Restrictions Weight Bearing Restrictions: No     Mobility  Bed Mobility               General bed mobility comments: pt declined mobility 2* fatigue    Transfers                   General transfer comment: pt declined mobility    Ambulation/Gait               General Gait Details: pt declined mobility   Stairs             Wheelchair Mobility     Tilt Bed    Modified Rankin (Stroke Patients Only)       Balance                                            Cognition Arousal/Alertness: Awake/alert Behavior During Therapy: WFL for tasks assessed/performed Overall Cognitive Status: Within Functional Limits for tasks assessed                                          Exercises General Exercises - Upper  Extremity Shoulder Flexion: AROM, Both, 10 reps, Supine General Exercises - Lower Extremity Ankle Circles/Pumps: AROM, Both, 10 reps, Supine Quad Sets: AROM, Both, 5 reps, Supine Heel Slides: AAROM, Both, 10 reps, Supine    General Comments        Pertinent Vitals/Pain Pain Assessment Pain Assessment: No/denies pain    Home Living                          Prior Function            PT Goals (current goals can now be found in the care plan section) Acute Rehab PT Goals Time For Goal Achievement: 01/27/23 Potential to Achieve Goals: Good Progress towards PT goals: Not progressing toward goals - comment (fatigue)    Frequency    Min 1X/week      PT Plan Current plan remains appropriate    Co-evaluation              AM-PAC PT "6 Clicks" Mobility   Outcome Measure  Help needed turning from  your back to your side while in a flat bed without using bedrails?: A Little Help needed moving from lying on your back to sitting on the side of a flat bed without using bedrails?: A Little Help needed moving to and from a bed to a chair (including a wheelchair)?: A Little Help needed standing up from a chair using your arms (e.g., wheelchair or bedside chair)?: A Little Help needed to walk in hospital room?: A Little Help needed climbing 3-5 steps with a railing? : A Little 6 Click Score: 18    End of Session   Activity Tolerance: Patient tolerated treatment well Patient left: in bed;with call bell/phone within reach;with bed alarm set Nurse Communication: Mobility status PT Visit Diagnosis: Difficulty in walking, not elsewhere classified (R26.2);Muscle weakness (generalized) (M62.81)     Time: 9604-5409 PT Time Calculation (min) (ACUTE ONLY): 8 min  Charges:    $Therapeutic Exercise: 8-22 mins PT General Charges $$ ACUTE PT VISIT: 1 Visit                     Tamala Ser PT 01/17/2023  Acute Rehabilitation Services  Office  [phone]

## 2023-01-17 NOTE — Hospital Course (Signed)
Calvin [name] is a 77 yo male with PMH ischemic stroke 05/2022, BPH, neurogenic bladder now with suprapubic cath in place since 12/25/22 and HTN.  Recently admitted 7/20-7/21 due to obstruction of his suprapubic catheter for which he was evaluated by urology and found to have encrusted suprapubic tube which was exchanged.  He was also found to have new onset A-fib with RVR and was discharged on oral Cardizem and Xarelto. Next day, 7/22, patient was brought to the ED with a fever 100.9, lethargy and redness and malodor noted at the site of suprapubic catheter insertion.   In the ED, he was afebrile but tachycardic, tachypneic, he was noted to have a low O2 sat of 89% on room air, placed on 2 L oxygen nasal cannula. Labs showed WBC 13.1, sodium 130, lactate normal x 2. UA with positive nitrite, moderate leukocytes, and microscopy showing 21-50 RBCs, 21-50 WBCs, and many bacteria.  Urine culture grew multiple species and he was continued on an empiric course.   He also developed urethral bleeding and underwent evaluation with IR and underwent bilateral prostate artery embolization and coil embolization of right inferior rectal artery on 01/16/2023.

## 2023-01-17 NOTE — Progress Notes (Signed)
Progress Note    Calvin [name]   ZOX:[id number]  DOB: [birth year]  DOA: 01/07/2023     9 PCP: [name], MD  Initial CC: fever  Hospital Course: Calvin [name] is a 77 yo male with PMH ischemic stroke 05/2022, BPH, neurogenic bladder now with suprapubic cath in place since 12/25/22 and HTN.  Recently admitted 7/20-7/21 due to obstruction of his suprapubic catheter for which he was evaluated by urology and found to have encrusted suprapubic tube which was exchanged.  He was also found to have new onset A-fib with RVR and was discharged on oral Cardizem and Xarelto. Next day, 7/22, patient was brought to the ED with a fever 100.9, lethargy and redness and malodor noted at the site of suprapubic catheter insertion.   In the ED, he was afebrile but tachycardic, tachypneic, he was noted to have a low O2 sat of 89% on room air, placed on 2 L oxygen nasal cannula. Labs showed WBC 13.1, sodium 130, lactate normal x 2. UA with positive nitrite, moderate leukocytes, and microscopy showing 21-50 RBCs, 21-50 WBCs, and many bacteria.  Urine culture grew multiple species and he was continued on an empiric course.   He also developed urethral bleeding and underwent evaluation with IR and underwent bilateral prostate artery embolization and coil embolization of right inferior rectal artery on 01/16/2023.  Interval History:  Tolerated embolization yesterday. No symptoms or concerns this morning.   Assessment and Plan:  UTI associated with suprapubic catheter Sepsis - POA Neurogenic bladder s/p suprapubic catheter  BPH Reported fever at home Noted to have tachycardia, tachypnea, lethargy, UTI, and malodorous suprapubic catheter insertion site Blood cultures no growth so far.  Urine culture showed multiple species present Completed 7-day course of IV Rocephin.   Urethral bleeding Started on 7/25.  No trauma. Per urology note, bleeding is likely from prostatic urethra where he has an area of necrotic  tissue post resection.  A large bore ureteral catheter was placed in order to tamponade the bleeding with outside of intervention. Xarelto has been stopped for now - s/p IR embolization of bilateral prostate artery and right inferior rectal artery on 01/16/2023 - Urine in Foley bag remains clear yellow - Tentative plan for removing urethral Foley tomorrow per urology.  Will wait to restart Xarelto at that time and then monitor overnight for no further hematuria  Acute on chronic diastolic CHF Essential hypertension Daughter reports shortness of breath on exertion worsening lately. Chest x-ray on admission showed cardiomegaly and vascular congestion. BNP was elevated to 227 Most recent echo done in December 2023 was showing EF 60 to 65% and diastolic function could not be evaluated.  Patient had acute episode of shortness of breath on 7/29.  Chest x-ray showed cardiomegaly with mild, diffuse bilateral interstitial pulm opacities.   - Continue lasix   Paroxysmal A-fib Continue Cardizem.  Metoprolol as needed Xarelto on hold due to urethral bleeding.    Hyperlipidemia Continue Lipitor   Mild hyponatremia Continue gentle IV fluid hydration with normal saline  Mild hypokalemia Replete as needed  Anxiety/depression Not on meds   Diarrhea/constipation Diarrhea improved after he was started on Banatrol and Imodium as needed.  Imodium has been stopped.  Okay to continue Banatrol   Impaired mobility PT eval obtained.  Home Health PT versus SNF.     Old records reviewed in assessment of this patient  Antimicrobials: Rocephin 01/08/2023 >> 01/16/2023  DVT prophylaxis:  SCDs Start: 01/08/23 0506   Code Status:  Code Status: Full Code  Mobility Assessment (Last 72 Hours)     Mobility Assessment     Row Name 01/17/23 [phone] 01/16/23 2244 01/16/23 1812 01/15/23 2050 01/15/23 1814   Does patient have an order for bedrest or is patient medically unstable No - Continue assessment No -  Continue assessment No - Continue assessment No - Continue assessment No - Continue assessment   What is the highest level of mobility based on the progressive mobility assessment? Level 5 (Walks with assist in room/hall) - Balance while stepping forward/back and can walk in room with assist - Complete Level 5 (Walks with assist in room/hall) - Balance while stepping forward/back and can walk in room with assist - Complete Level 5 (Walks with assist in room/hall) - Balance while stepping forward/back and can walk in room with assist - Complete Level 5 (Walks with assist in room/hall) - Balance while stepping forward/back and can walk in room with assist - Complete Level 5 (Walks with assist in room/hall) - Balance while stepping forward/back and can walk in room with assist - Complete   Is the above level different from baseline mobility prior to current illness? Yes - Recommend PT order Yes - Recommend PT order -- Yes - Recommend PT order Yes - Recommend PT order    Row Name 01/15/23 1552 01/15/23 0946         What is the highest level of mobility based on the progressive mobility assessment? Level 5 (Walks with assist in room/hall) - Balance while stepping forward/back and can walk in room with assist - Complete Level 5 (Walks with assist in room/hall) - Balance while stepping forward/back and can walk in room with assist - Complete               Barriers to discharge:  Disposition Plan:  Home with HH Status is: Inpt  Objective: Blood pressure (!) 144/92, pulse 100, temperature 98 F (36.7 C), resp. rate 20, height 6' (1.829 m), weight 114.3 kg, SpO2 96%.  Examination:  Physical Exam Constitutional:      General: He is not in acute distress.    Appearance: Normal appearance.  HENT:     Head: Normocephalic and atraumatic.     Mouth/Throat:     Mouth: Mucous membranes are moist.  Eyes:     Extraocular Movements: Extraocular movements intact.  Cardiovascular:     Rate and Rhythm:  Normal rate and regular rhythm.  Pulmonary:     Effort: Pulmonary effort is normal. No respiratory distress.     Breath sounds: Normal breath sounds. No wheezing.  Abdominal:     General: Bowel sounds are normal. There is no distension.     Palpations: Abdomen is soft.     Tenderness: There is no abdominal tenderness.  Genitourinary:    Comments: SP cath in place and indwelling foley in place  Musculoskeletal:        General: Normal range of motion.     Cervical back: Normal range of motion and neck supple.  Skin:    General: Skin is warm and dry.  Neurological:     General: No focal deficit present.     Mental Status: He is alert.  Psychiatric:        Mood and Affect: Mood normal.        Behavior: Behavior normal.      Consultants:  Urology IR  Procedures:  01/16/2023: Bilateral prostate artery embolization; Coil embolization of right inferior rectal artery  Data Reviewed: Results for orders placed or performed during the hospital encounter of 01/07/23 (from the past 24 hour(s))  CBC with Differential/Platelet     Status: Abnormal   Collection Time: 01/17/23  4:07 AM  Result Value Ref Range   WBC 14.2 (H) 4.0 - 10.5 K/uL   RBC 3.74 (L) 4.22 - 5.81 MIL/uL   Hemoglobin 12.5 (L) 13.0 - 17.0 g/dL   HCT 63.8 (L) 75.6 - 43.3 %   MCV 98.4 80.0 - 100.0 fL   MCH 33.4 26.0 - 34.0 pg   MCHC 34.0 30.0 - 36.0 g/dL   RDW 29.5 18.8 - 41.6 %   Platelets 488 (H) 150 - 400 K/uL   nRBC 0.0 0.0 - 0.2 %   Neutrophils Relative % 72 %   Neutro Abs 10.4 (H) 1.7 - 7.7 K/uL   Lymphocytes Relative 16 %   Lymphs Abs 2.3 0.7 - 4.0 K/uL   Monocytes Relative 7 %   Monocytes Absolute 1.0 0.1 - 1.0 K/uL   Eosinophils Relative 2 %   Eosinophils Absolute 0.3 0.0 - 0.5 K/uL   Basophils Relative 1 %   Basophils Absolute 0.1 0.0 - 0.1 K/uL   Immature Granulocytes 2 %   Abs Immature Granulocytes 0.26 (H) 0.00 - 0.07 K/uL  Basic metabolic panel     Status: Abnormal   Collection Time: 01/17/23   4:07 AM  Result Value Ref Range   Sodium 133 (L) 135 - 145 mmol/L   Potassium 3.6 3.5 - 5.1 mmol/L   Chloride 99 98 - 111 mmol/L   CO2 24 22 - 32 mmol/L   Glucose, Bld 122 (H) 70 - 99 mg/dL   BUN 22 8 - 23 mg/dL   Creatinine, Ser 6.06 0.61 - 1.24 mg/dL   Calcium 9.3 8.9 - 30.1 mg/dL   GFR, Estimated >60 >10 mL/min   Anion gap 10 5 - 15    I have reviewed pertinent nursing notes, vitals, labs, and images as necessary. I have ordered labwork to follow up on as indicated.  I have reviewed the last notes from staff over past 24 hours. I have discussed patient's care plan and test results with nursing staff, CM/SW, and other staff as appropriate.  Time spent: Greater than 50% of the 55 minute visit was spent in counseling/coordination of care for the patient as laid out in the A&P.   LOS: 9 days   Lewie Chamber, MD [facility] 01/17/2023, 1:59 PM

## 2023-01-18 DIAGNOSIS — T83511A Infection and inflammatory reaction due to indwelling urethral catheter, initial encounter: Secondary | ICD-10-CM | POA: Diagnosis not present

## 2023-01-18 DIAGNOSIS — N390 Urinary tract infection, site not specified: Secondary | ICD-10-CM | POA: Diagnosis not present

## 2023-01-18 MED ORDER — RIVAROXABAN 20 MG PO TABS
20.0000 mg | ORAL_TABLET | Freq: Every day | ORAL | Status: DC
  Administered 2023-01-18 – 2023-01-19 (×2): 20 mg via ORAL
  Filled 2023-01-18: qty 1

## 2023-01-18 MED ORDER — RIVAROXABAN 20 MG PO TABS: 20.0000 mg | ORAL_TABLET | Freq: Every day | ORAL | Status: DC

## 2023-01-18 NOTE — Progress Notes (Signed)
Received report from off-going RN. Agree with previous assessment. Pt watching TV comfortably in bed.

## 2023-01-18 NOTE — TOC Progression Note (Addendum)
Transition of Care [facility]) - Progression Note    Patient Details  Name: Calvin [name] [name]: [id number] Date of Birth: 1945/07/25  Transition of Care [facility]) CM/SW Contact  Larrie Kass, LCSW Phone Number: 01/18/2023, 11:07 AM  Clinical Narrative:    CSW spoke with pt's daughter to discuss recommendations for SNF placement. Pt's daughter reports that pt would like [facility]. CSW explained the process, pt will need insurance authorization. CSW to work pt up for SNF placement.TOC to follow.  Adden  12:50pm Presented bed offers to pt's daughter, she has chose [facility]. CSW spoke with Alphonzo Lemmings, pt can admitted over the week end if Berkley Harvey is approved. CSW to start insurance auth.  Expected Discharge Plan: Skilled Nursing Facility Barriers to Discharge: Continued Medical Work up  Expected Discharge Plan and Services In-house Referral: Clinical Social Work     Living arrangements for the past 2 months: Single Family Home                                       Social Determinants of Health (SDOH) Interventions SDOH Screenings   Food Insecurity: No Food Insecurity (01/08/2023)  Housing: Low Risk  (01/08/2023)  Transportation Needs: No Transportation Needs (01/08/2023)  Utilities: Not At Risk (01/08/2023)  Alcohol Screen: Medium Risk (04/14/2020)  Depression (PHQ2-9): Low Risk  (06/28/2022)  Financial Resource Strain: Low Risk  (04/14/2020)  Physical Activity: Inactive (03/08/2021)  Social Connections: Socially Isolated (03/08/2021)  Stress: No Stress Concern Present (04/14/2020)  Tobacco Use: Low Risk  (01/07/2023)    Readmission Risk Interventions    01/06/2023   12:32 PM 10/29/2022   12:11 PM 03/19/2022    2:17 PM  Readmission Risk Prevention Plan  Post Dischage Appt   Complete  Medication Screening   Complete  Transportation Screening Complete Complete Complete  PCP or Specialist Appt within 5-7 Days Complete    PCP or Specialist Appt within 3-5 Days  Complete    Home Care Screening Complete    Medication Review (RN CM) Complete    HRI or Home Care Consult  Complete   Social Work Consult for Recovery Care Planning/Counseling  Complete   Palliative Care Screening  Not Applicable   Medication Review [employer])  Complete

## 2023-01-18 NOTE — Care Management Important Message (Signed)
Important Message  Patient Details IM Letter given. Name: Calvin [name] [name]: [id number] Date of Birth: 01-01-1946   Medicare Important Message Given:  Yes     Caren Macadam 01/18/2023, 2:44 PM

## 2023-01-18 NOTE — Progress Notes (Signed)
Subjective: Calvin [name]. Pt is expresses frustration with daily NPO status.  Objective: Vital signs in last 24 hours: Temp:  [97.5 F (36.4 C)-98.2 F (36.8 C)] 98.2 F (36.8 C) (08/02 0615) Pulse Rate:  [78-100] 85 (08/02 0615) Resp:  [14-20] 15 (08/02 0615) BP: (144-177)/(92-111) 158/111 (08/02 0615) SpO2:  [96 %-97 %] 96 % (08/02 0615)  Intake/Output from previous day: 08/01 0701 - 08/02 0700 In: 720 [P.O.:720] Out: 1600 [Urine:1600]  Intake/Output this shift: No intake/output data recorded.  Physical Exam:  General: Alert and oriented CV: No cyanosis Lungs: equal chest rise Abdomen: Soft, NTND, no rebound or guarding Gu: SPT in place. Clear fluid irrigated from bladder. 12f foley in penile urethra, capped  Lab Results: Recent Labs    01/16/23 0429 01/17/23 0407 01/18/23 0402  HGB 11.7* 12.5* 12.4*  HCT 34.5* 36.8* 36.7*   BMET Recent Labs    01/16/23 0429 01/17/23 0407  NA 135 133*  K 3.6 3.6  CL 98 99  CO2 25 24  GLUCOSE 116* 122*  BUN 20 22  CREATININE 0.90 0.93  CALCIUM 9.1 9.3     Studies/Results: IR EMBO TUMOR ORGAN ISCHEMIA INFARCT INC GUIDE ROADMAPPING  Result Date: 01/17/2023 INDICATION: 77 year old male with history of prostatic hematuria, currently admitted as an inpatient. EXAM: 1. Ultrasound-guided vascular access of the left radial artery. 2. Selective catheterization and angiography of the right internal iliac, right internal pudendal, right inferior rectal, right prostatic, left common iliac, left internal iliac, left obturator, and left prostatic arteries. 3. Cone beam CT. 4. Coil embolization of right inferior rectal artery. 5. Right prostatic artery embolization. 6. Left prostatic artery embolization. MEDICATIONS: 200 mg ciprofloxacin, intravenous ANESTHESIA/SEDATION: Moderate (conscious) sedation was employed during this procedure. A total of Versed 2.5 mg and Fentanyl 100 mcg was administered intravenously. Moderate Sedation Time: 103  minutes. The patient's level of consciousness and vital signs were monitored continuously by radiology nursing throughout the procedure under my direct supervision. CONTRAST:  20 mL Omnipaque 300 FLUOROSCOPY: Radiation Exposure Index (as provided by the fluoroscopic device): 2,819 mGy Kerma COMPLICATIONS: None immediate. PROCEDURE: Informed consent was obtained from the patient following explanation of the procedure, risks, benefits and alternatives. The patient understands, agrees and consents for the procedure. All questions were addressed. A time out was performed prior to the initiation of the procedure. Maximal barrier sterile technique utilized including caps, mask, sterile gowns, sterile gloves, large sterile drape, hand hygiene, and Betadine prep. The left wrist was prepped and draped in standard fashion. Pulse oximeter was attached to the left thumb. Tora Perches test was performed, grade A. The left radial artery measured 0.4 cm in diameter. Subdermal Local anesthesia was provided at the planned needle entry site with 1% lidocaine. A small skin nick was made. Under direct ultrasound visualization, the left radial artery was punctured with a 21 gauge micropuncture needle. A permanent image was captured and stored in the record. A microwire was placed and exchanged for a 4/5 French slender sheath. The sheath was flushed followed by installation of standard radial cocktail. A 5 French MG2 glide catheter and Bentson wire were directed under fluoroscopic visualization through the left upper extremity, around the aortic arch, through the descending thoracic aorta and into the abdominal aorta. The catheter was then directed into the right internal iliac artery. Right internal iliac artery angiogram demonstrated patency of the anterior and posterior divisions with the prostatic artery appearing to arise from the internal pudendal artery. A 1.9 [place] Progreat lambda microcatheter  and standard 0.014 inch air Stahl wire  within directed into the right internal pudendal artery. Angiogram was performed which demonstrated patency and origination of the right prostatic artery. The right prostatic artery was then selected and right prostatic artery angiogram was performed. Cone beam CT was then performed which demonstrated a proximal inferior rectal branch about the prostatic artery. There are no additional evidence of nontarget branch vessels. The inferior rectal artery was then selected and angiogram was performed which demonstrated no evidence of nontarget branch vessels. Next, proximal embolization of the right inferior rectal artery was performed with a single 1 mm x 5 mm low profile Ruby detachable coil. The catheter was retracted into the parent vessel and injection of contrast demonstrated adequate embolization and preserved patency of the right prostatic artery. The microcatheter was then directed deeper into the distal prostatic artery and particle embolization with dilute 400 micron hydro pleural see years was performed until stasis was achieved. The catheter was then retracted into the proximal prostatic artery and completion arteriogram demonstrated adequate embolization of the prostatic parenchyma. The microcatheter was then removed. The 5 French base catheter was then directed under fluoroscopic guidance to the left internal iliac artery. Angiogram was performed which demonstrated patency of the posterior and anterior divisions with the prostatic artery arising from the mid obturator artery. The obturator artery was then selected with the straight lambda Progreat microcatheter and Aristotle 14 microwire. Angiogram was performed in different obliquities to display the origin of the prostatic artery. There were several unsuccessful attempts with cannulating the prostatic artery including use of a fathom 14 microwire. Therefore, the catheter was exchanged for a triple angle lambda microcatheter which was then able to  successfully select the prostatic artery origin with wireless technique. Angiogram was performed of the prostatic artery which demonstrated opacification of the prostate and no evidence of nontarget branch vessels. There was some spasm within the prostatic artery, therefore 200 mcg of nitroglycerin was administered intra-arterially. Next, particle embolization of the left hemi prostate was performed with 400 micron hydro pearls. Due to the spasm visualized on prior arteriogram, the proximal prostatic artery was embolized with several low-profile Ruby detachable microcoils to ensure complete embolization. The catheter was retracted into the obturator artery and completion angiogram was performed which demonstrated appropriate and complete embolization of the left prostatic artery. The catheters were removed. A TR band was placed about the left wrist and inflated than the indwelling slender sheath was removed successfully. The patient tolerated the procedure well was transferred back to the floor in good condition. IMPRESSION: 1. Technically successful particle embolization of the right prostatic artery. 2. Shared origin of the right prostatic artery and right inferior rectal artery necessitating coil embolization of the right inferior rectal artery for embolic protection purposes. 3. Technically successful particle and coil embolization of the left prostatic artery. Marliss Coots, MD [facility] Radiology Electronically Signed   By: Marliss Coots M.D.   On: 01/17/2023 07:28   IR US Guide Vasc Access Left  Result Date: 01/17/2023 INDICATION: 78 year old male with history of prostatic hematuria, currently admitted as an inpatient. EXAM: 1. Ultrasound-guided vascular access of the left radial artery. 2. Selective catheterization and angiography of the right internal iliac, right internal pudendal, right inferior rectal, right prostatic, left common iliac, left internal  iliac, left obturator, and left prostatic arteries. 3. Cone beam CT. 4. Coil embolization of right inferior rectal artery. 5. Right prostatic artery embolization. 6. Left prostatic artery embolization. MEDICATIONS: 200  mg ciprofloxacin, intravenous ANESTHESIA/SEDATION: Moderate (conscious) sedation was employed during this procedure. A total of Versed 2.5 mg and Fentanyl 100 mcg was administered intravenously. Moderate Sedation Time: 103 minutes. The patient's level of consciousness and vital signs were monitored continuously by radiology nursing throughout the procedure under my direct supervision. CONTRAST:  20 mL Omnipaque 300 FLUOROSCOPY: Radiation Exposure Index (as provided by the fluoroscopic device): 2,819 mGy Kerma COMPLICATIONS: None immediate. PROCEDURE: Informed consent was obtained from the patient following explanation of the procedure, risks, benefits and alternatives. The patient understands, agrees and consents for the procedure. All questions were addressed. A time out was performed prior to the initiation of the procedure. Maximal barrier sterile technique utilized including caps, mask, sterile gowns, sterile gloves, large sterile drape, hand hygiene, and Betadine prep. The left wrist was prepped and draped in standard fashion. Pulse oximeter was attached to the left thumb. Tora Perches test was performed, grade A. The left radial artery measured 0.4 cm in diameter. Subdermal Local anesthesia was provided at the planned needle entry site with 1% lidocaine. A small skin nick was made. Under direct ultrasound visualization, the left radial artery was punctured with a 21 gauge micropuncture needle. A permanent image was captured and stored in the record. A microwire was placed and exchanged for a 4/5 French slender sheath. The sheath was flushed followed by installation of standard radial cocktail. A 5 French MG2 glide catheter and Bentson wire were directed under fluoroscopic visualization through the  left upper extremity, around the aortic arch, through the descending thoracic aorta and into the abdominal aorta. The catheter was then directed into the right internal iliac artery. Right internal iliac artery angiogram demonstrated patency of the anterior and posterior divisions with the prostatic artery appearing to arise from the internal pudendal artery. A 1.9 French Progreat lambda microcatheter and standard 0.014 inch air Stahl wire within directed into the right internal pudendal artery. Angiogram was performed which demonstrated patency and origination of the right prostatic artery. The right prostatic artery was then selected and right prostatic artery angiogram was performed. Cone beam CT was then performed which demonstrated a proximal inferior rectal branch about the prostatic artery. There are no additional evidence of nontarget branch vessels. The inferior rectal artery was then selected and angiogram was performed which demonstrated no evidence of nontarget branch vessels. Next, proximal embolization of the right inferior rectal artery was performed with a single 1 mm x 5 mm low profile Ruby detachable coil. The catheter was retracted into the parent vessel and injection of contrast demonstrated adequate embolization and preserved patency of the right prostatic artery. The microcatheter was then directed deeper into the distal prostatic artery and particle embolization with dilute 400 micron hydro pleural see years was performed until stasis was achieved. The catheter was then retracted into the proximal prostatic artery and completion arteriogram demonstrated adequate embolization of the prostatic parenchyma. The microcatheter was then removed. The 5 French base catheter was then directed under fluoroscopic guidance to the left internal iliac artery. Angiogram was performed which demonstrated patency of the posterior and anterior divisions with the prostatic artery arising from the mid obturator  artery. The obturator artery was then selected with the straight lambda Progreat microcatheter and Aristotle 14 microwire. Angiogram was performed in different obliquities to display the origin of the prostatic artery. There were several unsuccessful attempts with cannulating the prostatic artery including use of a fathom 14 microwire. Therefore, the catheter was exchanged for a triple angle lambda microcatheter which was  then able to successfully select the prostatic artery origin with wireless technique. Angiogram was performed of the prostatic artery which demonstrated opacification of the prostate and no evidence of nontarget branch vessels. There was some spasm within the prostatic artery, therefore 200 mcg of nitroglycerin was administered intra-arterially. Next, particle embolization of the left hemi prostate was performed with 400 micron hydro pearls. Due to the spasm visualized on prior arteriogram, the proximal prostatic artery was embolized with several low-profile Ruby detachable microcoils to ensure complete embolization. The catheter was retracted into the obturator artery and completion angiogram was performed which demonstrated appropriate and complete embolization of the left prostatic artery. The catheters were removed. A TR band was placed about the left wrist and inflated than the indwelling slender sheath was removed successfully. The patient tolerated the procedure well was transferred back to the floor in good condition. IMPRESSION: 1. Technically successful particle embolization of the right prostatic artery. 2. Shared origin of the right prostatic artery and right inferior rectal artery necessitating coil embolization of the right inferior rectal artery for embolic protection purposes. 3. Technically successful particle and coil embolization of the left prostatic artery. Marliss Coots, MD [facility] Radiology Electronically Signed   By: Marliss Coots M.D.   On: 01/17/2023 07:28   IR Angiogram Pelvis Selective Or Supraselective  Result Date: 01/17/2023 INDICATION: 77 year old male with history of prostatic hematuria, currently admitted as an inpatient. EXAM: 1. Ultrasound-guided vascular access of the left radial artery. 2. Selective catheterization and angiography of the right internal iliac, right internal pudendal, right inferior rectal, right prostatic, left common iliac, left internal iliac, left obturator, and left prostatic arteries. 3. Cone beam CT. 4. Coil embolization of right inferior rectal artery. 5. Right prostatic artery embolization. 6. Left prostatic artery embolization. MEDICATIONS: 200 mg ciprofloxacin, intravenous ANESTHESIA/SEDATION: Moderate (conscious) sedation was employed during this procedure. A total of Versed 2.5 mg and Fentanyl 100 mcg was administered intravenously. Moderate Sedation Time: 103 minutes. The patient's level of consciousness and vital signs were monitored continuously by radiology nursing throughout the procedure under my direct supervision. CONTRAST:  20 mL Omnipaque 300 FLUOROSCOPY: Radiation Exposure Index (as provided by the fluoroscopic device): 2,819 mGy Kerma COMPLICATIONS: None immediate. PROCEDURE: Informed consent was obtained from the patient following explanation of the procedure, risks, benefits and alternatives. The patient understands, agrees and consents for the procedure. All questions were addressed. A time out was performed prior to the initiation of the procedure. Maximal barrier sterile technique utilized including caps, mask, sterile gowns, sterile gloves, large sterile drape, hand hygiene, and Betadine prep. The left wrist was prepped and draped in standard fashion. Pulse oximeter was attached to the left thumb. Tora Perches test was performed, grade A. The left radial artery measured 0.4 cm in diameter. Subdermal Local anesthesia was provided at the planned needle entry site with 1%  lidocaine. A small skin nick was made. Under direct ultrasound visualization, the left radial artery was punctured with a 21 gauge micropuncture needle. A permanent image was captured and stored in the record. A microwire was placed and exchanged for a 4/5 French slender sheath. The sheath was flushed followed by installation of standard radial cocktail. A 5 French MG2 glide catheter and Bentson wire were directed under fluoroscopic visualization through the left upper extremity, around the aortic arch, through the descending thoracic aorta and into the abdominal aorta. The catheter was then directed into the right internal iliac artery. Right internal iliac artery angiogram demonstrated  patency of the anterior and posterior divisions with the prostatic artery appearing to arise from the internal pudendal artery. A 1.9 French Progreat lambda microcatheter and standard 0.014 inch air Stahl wire within directed into the right internal pudendal artery. Angiogram was performed which demonstrated patency and origination of the right prostatic artery. The right prostatic artery was then selected and right prostatic artery angiogram was performed. Cone beam CT was then performed which demonstrated a proximal inferior rectal branch about the prostatic artery. There are no additional evidence of nontarget branch vessels. The inferior rectal artery was then selected and angiogram was performed which demonstrated no evidence of nontarget branch vessels. Next, proximal embolization of the right inferior rectal artery was performed with a single 1 mm x 5 mm low profile Ruby detachable coil. The catheter was retracted into the parent vessel and injection of contrast demonstrated adequate embolization and preserved patency of the right prostatic artery. The microcatheter was then directed deeper into the distal prostatic artery and particle embolization with dilute 400 micron hydro pleural see years was performed until stasis was  achieved. The catheter was then retracted into the proximal prostatic artery and completion arteriogram demonstrated adequate embolization of the prostatic parenchyma. The microcatheter was then removed. The 5 French base catheter was then directed under fluoroscopic guidance to the left internal iliac artery. Angiogram was performed which demonstrated patency of the posterior and anterior divisions with the prostatic artery arising from the mid obturator artery. The obturator artery was then selected with the straight lambda Progreat microcatheter and Aristotle 14 microwire. Angiogram was performed in different obliquities to display the origin of the prostatic artery. There were several unsuccessful attempts with cannulating the prostatic artery including use of a fathom 14 microwire. Therefore, the catheter was exchanged for a triple angle lambda microcatheter which was then able to successfully select the prostatic artery origin with wireless technique. Angiogram was performed of the prostatic artery which demonstrated opacification of the prostate and no evidence of nontarget branch vessels. There was some spasm within the prostatic artery, therefore 200 mcg of nitroglycerin was administered intra-arterially. Next, particle embolization of the left hemi prostate was performed with 400 micron hydro pearls. Due to the spasm visualized on prior arteriogram, the proximal prostatic artery was embolized with several low-profile Ruby detachable microcoils to ensure complete embolization. The catheter was retracted into the obturator artery and completion angiogram was performed which demonstrated appropriate and complete embolization of the left prostatic artery. The catheters were removed. A TR band was placed about the left wrist and inflated than the indwelling slender sheath was removed successfully. The patient tolerated the procedure well was transferred back to the floor in good condition. IMPRESSION: 1.  Technically successful particle embolization of the right prostatic artery. 2. Shared origin of the right prostatic artery and right inferior rectal artery necessitating coil embolization of the right inferior rectal artery for embolic protection purposes. 3. Technically successful particle and coil embolization of the left prostatic artery. Marliss Coots, MD [facility] Radiology Electronically Signed   By: Marliss Coots M.D.   On: 01/17/2023 07:28   IR 3D Independent Annabell Sabal  Result Date: 01/17/2023 INDICATION: 77 year old male with history of prostatic hematuria, currently admitted as an inpatient. EXAM: 1. Ultrasound-guided vascular access of the left radial artery. 2. Selective catheterization and angiography of the right internal iliac, right internal pudendal, right inferior rectal, right prostatic, left common iliac, left internal iliac, left obturator, and left prostatic arteries. 3. Cone  beam CT. 4. Coil embolization of right inferior rectal artery. 5. Right prostatic artery embolization. 6. Left prostatic artery embolization. MEDICATIONS: 200 mg ciprofloxacin, intravenous ANESTHESIA/SEDATION: Moderate (conscious) sedation was employed during this procedure. A total of Versed 2.5 mg and Fentanyl 100 mcg was administered intravenously. Moderate Sedation Time: 103 minutes. The patient's level of consciousness and vital signs were monitored continuously by radiology nursing throughout the procedure under my direct supervision. CONTRAST:  20 mL Omnipaque 300 FLUOROSCOPY: Radiation Exposure Index (as provided by the fluoroscopic device): 2,819 mGy Kerma COMPLICATIONS: None immediate. PROCEDURE: Informed consent was obtained from the patient following explanation of the procedure, risks, benefits and alternatives. The patient understands, agrees and consents for the procedure. All questions were addressed. A time out was performed prior to the initiation of the  procedure. Maximal barrier sterile technique utilized including caps, mask, sterile gowns, sterile gloves, large sterile drape, hand hygiene, and Betadine prep. The left wrist was prepped and draped in standard fashion. Pulse oximeter was attached to the left thumb. Tora Perches test was performed, grade A. The left radial artery measured 0.4 cm in diameter. Subdermal Local anesthesia was provided at the planned needle entry site with 1% lidocaine. A small skin nick was made. Under direct ultrasound visualization, the left radial artery was punctured with a 21 gauge micropuncture needle. A permanent image was captured and stored in the record. A microwire was placed and exchanged for a 4/5 French slender sheath. The sheath was flushed followed by installation of standard radial cocktail. A 5 French MG2 glide catheter and Bentson wire were directed under fluoroscopic visualization through the left upper extremity, around the aortic arch, through the descending thoracic aorta and into the abdominal aorta. The catheter was then directed into the right internal iliac artery. Right internal iliac artery angiogram demonstrated patency of the anterior and posterior divisions with the prostatic artery appearing to arise from the internal pudendal artery. A 1.9 French Progreat lambda microcatheter and standard 0.014 inch air Stahl wire within directed into the right internal pudendal artery. Angiogram was performed which demonstrated patency and origination of the right prostatic artery. The right prostatic artery was then selected and right prostatic artery angiogram was performed. Cone beam CT was then performed which demonstrated a proximal inferior rectal branch about the prostatic artery. There are no additional evidence of nontarget branch vessels. The inferior rectal artery was then selected and angiogram was performed which demonstrated no evidence of nontarget branch vessels. Next, proximal embolization of the right  inferior rectal artery was performed with a single 1 mm x 5 mm low profile Ruby detachable coil. The catheter was retracted into the parent vessel and injection of contrast demonstrated adequate embolization and preserved patency of the right prostatic artery. The microcatheter was then directed deeper into the distal prostatic artery and particle embolization with dilute 400 micron hydro pleural see years was performed until stasis was achieved. The catheter was then retracted into the proximal prostatic artery and completion arteriogram demonstrated adequate embolization of the prostatic parenchyma. The microcatheter was then removed. The 5 French base catheter was then directed under fluoroscopic guidance to the left internal iliac artery. Angiogram was performed which demonstrated patency of the posterior and anterior divisions with the prostatic artery arising from the mid obturator artery. The obturator artery was then selected with the straight lambda Progreat microcatheter and Aristotle 14 microwire. Angiogram was performed in different obliquities to display the origin of the prostatic artery. There were several unsuccessful attempts with cannulating the  prostatic artery including use of a fathom 14 microwire. Therefore, the catheter was exchanged for a triple angle lambda microcatheter which was then able to successfully select the prostatic artery origin with wireless technique. Angiogram was performed of the prostatic artery which demonstrated opacification of the prostate and no evidence of nontarget branch vessels. There was some spasm within the prostatic artery, therefore 200 mcg of nitroglycerin was administered intra-arterially. Next, particle embolization of the left hemi prostate was performed with 400 micron hydro pearls. Due to the spasm visualized on prior arteriogram, the proximal prostatic artery was embolized with several low-profile Ruby detachable microcoils to ensure complete  embolization. The catheter was retracted into the obturator artery and completion angiogram was performed which demonstrated appropriate and complete embolization of the left prostatic artery. The catheters were removed. A TR band was placed about the left wrist and inflated than the indwelling slender sheath was removed successfully. The patient tolerated the procedure well was transferred back to the floor in good condition. IMPRESSION: 1. Technically successful particle embolization of the right prostatic artery. 2. Shared origin of the right prostatic artery and right inferior rectal artery necessitating coil embolization of the right inferior rectal artery for embolic protection purposes. 3. Technically successful particle and coil embolization of the left prostatic artery. Marliss Coots, MD [facility] Radiology Electronically Signed   By: Marliss Coots M.D.   On: 01/17/2023 07:28   IR Angiogram Selective Each Additional Vessel  Result Date: 01/17/2023 INDICATION: 77 year old male with history of prostatic hematuria, currently admitted as an inpatient. EXAM: 1. Ultrasound-guided vascular access of the left radial artery. 2. Selective catheterization and angiography of the right internal iliac, right internal pudendal, right inferior rectal, right prostatic, left common iliac, left internal iliac, left obturator, and left prostatic arteries. 3. Cone beam CT. 4. Coil embolization of right inferior rectal artery. 5. Right prostatic artery embolization. 6. Left prostatic artery embolization. MEDICATIONS: 200 mg ciprofloxacin, intravenous ANESTHESIA/SEDATION: Moderate (conscious) sedation was employed during this procedure. A total of Versed 2.5 mg and Fentanyl 100 mcg was administered intravenously. Moderate Sedation Time: 103 minutes. The patient's level of consciousness and vital signs were monitored continuously by radiology nursing throughout the procedure  under my direct supervision. CONTRAST:  20 mL Omnipaque 300 FLUOROSCOPY: Radiation Exposure Index (as provided by the fluoroscopic device): 2,819 mGy Kerma COMPLICATIONS: None immediate. PROCEDURE: Informed consent was obtained from the patient following explanation of the procedure, risks, benefits and alternatives. The patient understands, agrees and consents for the procedure. All questions were addressed. A time out was performed prior to the initiation of the procedure. Maximal barrier sterile technique utilized including caps, mask, sterile gowns, sterile gloves, large sterile drape, hand hygiene, and Betadine prep. The left wrist was prepped and draped in standard fashion. Pulse oximeter was attached to the left thumb. Tora Perches test was performed, grade A. The left radial artery measured 0.4 cm in diameter. Subdermal Local anesthesia was provided at the planned needle entry site with 1% lidocaine. A small skin nick was made. Under direct ultrasound visualization, the left radial artery was punctured with a 21 gauge micropuncture needle. A permanent image was captured and stored in the record. A microwire was placed and exchanged for a 4/5 French slender sheath. The sheath was flushed followed by installation of standard radial cocktail. A 5 French MG2 glide catheter and Bentson wire were directed under fluoroscopic visualization through the left upper extremity, around the aortic arch, through the descending thoracic aorta  and into the abdominal aorta. The catheter was then directed into the right internal iliac artery. Right internal iliac artery angiogram demonstrated patency of the anterior and posterior divisions with the prostatic artery appearing to arise from the internal pudendal artery. A 1.9 French Progreat lambda microcatheter and standard 0.014 inch air Stahl wire within directed into the right internal pudendal artery. Angiogram was performed which demonstrated patency and origination of the right  prostatic artery. The right prostatic artery was then selected and right prostatic artery angiogram was performed. Cone beam CT was then performed which demonstrated a proximal inferior rectal branch about the prostatic artery. There are no additional evidence of nontarget branch vessels. The inferior rectal artery was then selected and angiogram was performed which demonstrated no evidence of nontarget branch vessels. Next, proximal embolization of the right inferior rectal artery was performed with a single 1 mm x 5 mm low profile Ruby detachable coil. The catheter was retracted into the parent vessel and injection of contrast demonstrated adequate embolization and preserved patency of the right prostatic artery. The microcatheter was then directed deeper into the distal prostatic artery and particle embolization with dilute 400 micron hydro pleural see years was performed until stasis was achieved. The catheter was then retracted into the proximal prostatic artery and completion arteriogram demonstrated adequate embolization of the prostatic parenchyma. The microcatheter was then removed. The 5 French base catheter was then directed under fluoroscopic guidance to the left internal iliac artery. Angiogram was performed which demonstrated patency of the posterior and anterior divisions with the prostatic artery arising from the mid obturator artery. The obturator artery was then selected with the straight lambda Progreat microcatheter and Aristotle 14 microwire. Angiogram was performed in different obliquities to display the origin of the prostatic artery. There were several unsuccessful attempts with cannulating the prostatic artery including use of a fathom 14 microwire. Therefore, the catheter was exchanged for a triple angle lambda microcatheter which was then able to successfully select the prostatic artery origin with wireless technique. Angiogram was performed of the prostatic artery which demonstrated  opacification of the prostate and no evidence of nontarget branch vessels. There was some spasm within the prostatic artery, therefore 200 mcg of nitroglycerin was administered intra-arterially. Next, particle embolization of the left hemi prostate was performed with 400 micron hydro pearls. Due to the spasm visualized on prior arteriogram, the proximal prostatic artery was embolized with several low-profile Ruby detachable microcoils to ensure complete embolization. The catheter was retracted into the obturator artery and completion angiogram was performed which demonstrated appropriate and complete embolization of the left prostatic artery. The catheters were removed. A TR band was placed about the left wrist and inflated than the indwelling slender sheath was removed successfully. The patient tolerated the procedure well was transferred back to the floor in good condition. IMPRESSION: 1. Technically successful particle embolization of the right prostatic artery. 2. Shared origin of the right prostatic artery and right inferior rectal artery necessitating coil embolization of the right inferior rectal artery for embolic protection purposes. 3. Technically successful particle and coil embolization of the left prostatic artery. Marliss Coots, MD [facility] Radiology Electronically Signed   By: Marliss Coots M.D.   On: 01/17/2023 07:28   IR Angiogram Selective Each Additional Vessel  Result Date: 01/17/2023 INDICATION: 77 year old male with history of prostatic hematuria, currently admitted as an inpatient. EXAM: 1. Ultrasound-guided vascular access of the left radial artery. 2. Selective catheterization and angiography of the right  internal iliac, right internal pudendal, right inferior rectal, right prostatic, left common iliac, left internal iliac, left obturator, and left prostatic arteries. 3. Cone beam CT. 4. Coil embolization of right inferior rectal artery.  5. Right prostatic artery embolization. 6. Left prostatic artery embolization. MEDICATIONS: 200 mg ciprofloxacin, intravenous ANESTHESIA/SEDATION: Moderate (conscious) sedation was employed during this procedure. A total of Versed 2.5 mg and Fentanyl 100 mcg was administered intravenously. Moderate Sedation Time: 103 minutes. The patient's level of consciousness and vital signs were monitored continuously by radiology nursing throughout the procedure under my direct supervision. CONTRAST:  20 mL Omnipaque 300 FLUOROSCOPY: Radiation Exposure Index (as provided by the fluoroscopic device): 2,819 mGy Kerma COMPLICATIONS: None immediate. PROCEDURE: Informed consent was obtained from the patient following explanation of the procedure, risks, benefits and alternatives. The patient understands, agrees and consents for the procedure. All questions were addressed. A time out was performed prior to the initiation of the procedure. Maximal barrier sterile technique utilized including caps, mask, sterile gowns, sterile gloves, large sterile drape, hand hygiene, and Betadine prep. The left wrist was prepped and draped in standard fashion. Pulse oximeter was attached to the left thumb. Tora Perches test was performed, grade A. The left radial artery measured 0.4 cm in diameter. Subdermal Local anesthesia was provided at the planned needle entry site with 1% lidocaine. A small skin nick was made. Under direct ultrasound visualization, the left radial artery was punctured with a 21 gauge micropuncture needle. A permanent image was captured and stored in the record. A microwire was placed and exchanged for a 4/5 French slender sheath. The sheath was flushed followed by installation of standard radial cocktail. A 5 French MG2 glide catheter and Bentson wire were directed under fluoroscopic visualization through the left upper extremity, around the aortic arch, through the descending thoracic aorta and into the abdominal aorta. The  catheter was then directed into the right internal iliac artery. Right internal iliac artery angiogram demonstrated patency of the anterior and posterior divisions with the prostatic artery appearing to arise from the internal pudendal artery. A 1.9 French Progreat lambda microcatheter and standard 0.014 inch air Stahl wire within directed into the right internal pudendal artery. Angiogram was performed which demonstrated patency and origination of the right prostatic artery. The right prostatic artery was then selected and right prostatic artery angiogram was performed. Cone beam CT was then performed which demonstrated a proximal inferior rectal branch about the prostatic artery. There are no additional evidence of nontarget branch vessels. The inferior rectal artery was then selected and angiogram was performed which demonstrated no evidence of nontarget branch vessels. Next, proximal embolization of the right inferior rectal artery was performed with a single 1 mm x 5 mm low profile Ruby detachable coil. The catheter was retracted into the parent vessel and injection of contrast demonstrated adequate embolization and preserved patency of the right prostatic artery. The microcatheter was then directed deeper into the distal prostatic artery and particle embolization with dilute 400 micron hydro pleural see years was performed until stasis was achieved. The catheter was then retracted into the proximal prostatic artery and completion arteriogram demonstrated adequate embolization of the prostatic parenchyma. The microcatheter was then removed. The 5 French base catheter was then directed under fluoroscopic guidance to the left internal iliac artery. Angiogram was performed which demonstrated patency of the posterior and anterior divisions with the prostatic artery arising from the mid obturator artery. The obturator artery was then selected with the straight lambda Progreat microcatheter and Aristotle  14  microwire. Angiogram was performed in different obliquities to display the origin of the prostatic artery. There were several unsuccessful attempts with cannulating the prostatic artery including use of a fathom 14 microwire. Therefore, the catheter was exchanged for a triple angle lambda microcatheter which was then able to successfully select the prostatic artery origin with wireless technique. Angiogram was performed of the prostatic artery which demonstrated opacification of the prostate and no evidence of nontarget branch vessels. There was some spasm within the prostatic artery, therefore 200 mcg of nitroglycerin was administered intra-arterially. Next, particle embolization of the left hemi prostate was performed with 400 micron hydro pearls. Due to the spasm visualized on prior arteriogram, the proximal prostatic artery was embolized with several low-profile Ruby detachable microcoils to ensure complete embolization. The catheter was retracted into the obturator artery and completion angiogram was performed which demonstrated appropriate and complete embolization of the left prostatic artery. The catheters were removed. A TR band was placed about the left wrist and inflated than the indwelling slender sheath was removed successfully. The patient tolerated the procedure well was transferred back to the floor in good condition. IMPRESSION: 1. Technically successful particle embolization of the right prostatic artery. 2. Shared origin of the right prostatic artery and right inferior rectal artery necessitating coil embolization of the right inferior rectal artery for embolic protection purposes. 3. Technically successful particle and coil embolization of the left prostatic artery. Marliss Coots, MD [facility] Radiology Electronically Signed   By: Marliss Coots M.D.   On: 01/17/2023 07:28   IR CT PELVIS W/CM  Result Date: 01/17/2023 INDICATION: 77 year old male  with history of prostatic hematuria, currently admitted as an inpatient. EXAM: 1. Ultrasound-guided vascular access of the left radial artery. 2. Selective catheterization and angiography of the right internal iliac, right internal pudendal, right inferior rectal, right prostatic, left common iliac, left internal iliac, left obturator, and left prostatic arteries. 3. Cone beam CT. 4. Coil embolization of right inferior rectal artery. 5. Right prostatic artery embolization. 6. Left prostatic artery embolization. MEDICATIONS: 200 mg ciprofloxacin, intravenous ANESTHESIA/SEDATION: Moderate (conscious) sedation was employed during this procedure. A total of Versed 2.5 mg and Fentanyl 100 mcg was administered intravenously. Moderate Sedation Time: 103 minutes. The patient's level of consciousness and vital signs were monitored continuously by radiology nursing throughout the procedure under my direct supervision. CONTRAST:  20 mL Omnipaque 300 FLUOROSCOPY: Radiation Exposure Index (as provided by the fluoroscopic device): 2,819 mGy Kerma COMPLICATIONS: None immediate. PROCEDURE: Informed consent was obtained from the patient following explanation of the procedure, risks, benefits and alternatives. The patient understands, agrees and consents for the procedure. All questions were addressed. A time out was performed prior to the initiation of the procedure. Maximal barrier sterile technique utilized including caps, mask, sterile gowns, sterile gloves, large sterile drape, hand hygiene, and Betadine prep. The left wrist was prepped and draped in standard fashion. Pulse oximeter was attached to the left thumb. Tora Perches test was performed, grade A. The left radial artery measured 0.4 cm in diameter. Subdermal Local anesthesia was provided at the planned needle entry site with 1% lidocaine. A small skin nick was made. Under direct ultrasound visualization, the left radial artery was punctured with a 21 gauge micropuncture  needle. A permanent image was captured and stored in the record. A microwire was placed and exchanged for a 4/5 French slender sheath. The sheath was flushed followed by installation of standard radial cocktail. A 5 French MG2 glide  catheter and Bentson wire were directed under fluoroscopic visualization through the left upper extremity, around the aortic arch, through the descending thoracic aorta and into the abdominal aorta. The catheter was then directed into the right internal iliac artery. Right internal iliac artery angiogram demonstrated patency of the anterior and posterior divisions with the prostatic artery appearing to arise from the internal pudendal artery. A 1.9 French Progreat lambda microcatheter and standard 0.014 inch air Stahl wire within directed into the right internal pudendal artery. Angiogram was performed which demonstrated patency and origination of the right prostatic artery. The right prostatic artery was then selected and right prostatic artery angiogram was performed. Cone beam CT was then performed which demonstrated a proximal inferior rectal branch about the prostatic artery. There are no additional evidence of nontarget branch vessels. The inferior rectal artery was then selected and angiogram was performed which demonstrated no evidence of nontarget branch vessels. Next, proximal embolization of the right inferior rectal artery was performed with a single 1 mm x 5 mm low profile Ruby detachable coil. The catheter was retracted into the parent vessel and injection of contrast demonstrated adequate embolization and preserved patency of the right prostatic artery. The microcatheter was then directed deeper into the distal prostatic artery and particle embolization with dilute 400 micron hydro pleural see years was performed until stasis was achieved. The catheter was then retracted into the proximal prostatic artery and completion arteriogram demonstrated adequate embolization of the  prostatic parenchyma. The microcatheter was then removed. The 5 French base catheter was then directed under fluoroscopic guidance to the left internal iliac artery. Angiogram was performed which demonstrated patency of the posterior and anterior divisions with the prostatic artery arising from the mid obturator artery. The obturator artery was then selected with the straight lambda Progreat microcatheter and Aristotle 14 microwire. Angiogram was performed in different obliquities to display the origin of the prostatic artery. There were several unsuccessful attempts with cannulating the prostatic artery including use of a fathom 14 microwire. Therefore, the catheter was exchanged for a triple angle lambda microcatheter which was then able to successfully select the prostatic artery origin with wireless technique. Angiogram was performed of the prostatic artery which demonstrated opacification of the prostate and no evidence of nontarget branch vessels. There was some spasm within the prostatic artery, therefore 200 mcg of nitroglycerin was administered intra-arterially. Next, particle embolization of the left hemi prostate was performed with 400 micron hydro pearls. Due to the spasm visualized on prior arteriogram, the proximal prostatic artery was embolized with several low-profile Ruby detachable microcoils to ensure complete embolization. The catheter was retracted into the obturator artery and completion angiogram was performed which demonstrated appropriate and complete embolization of the left prostatic artery. The catheters were removed. A TR band was placed about the left wrist and inflated than the indwelling slender sheath was removed successfully. The patient tolerated the procedure well was transferred back to the floor in good condition. IMPRESSION: 1. Technically successful particle embolization of the right prostatic artery. 2. Shared origin of the right prostatic artery and right inferior rectal  artery necessitating coil embolization of the right inferior rectal artery for embolic protection purposes. 3. Technically successful particle and coil embolization of the left prostatic artery. Marliss Coots, MD [facility] Radiology Electronically Signed   By: Marliss Coots M.D.   On: 01/17/2023 07:28   IR Angiogram Selective Each Additional Vessel  Result Date: 01/17/2023 INDICATION: 76 year old male with history of prostatic hematuria,  currently admitted as an inpatient. EXAM: 1. Ultrasound-guided vascular access of the left radial artery. 2. Selective catheterization and angiography of the right internal iliac, right internal pudendal, right inferior rectal, right prostatic, left common iliac, left internal iliac, left obturator, and left prostatic arteries. 3. Cone beam CT. 4. Coil embolization of right inferior rectal artery. 5. Right prostatic artery embolization. 6. Left prostatic artery embolization. MEDICATIONS: 200 mg ciprofloxacin, intravenous ANESTHESIA/SEDATION: Moderate (conscious) sedation was employed during this procedure. A total of Versed 2.5 mg and Fentanyl 100 mcg was administered intravenously. Moderate Sedation Time: 103 minutes. The patient's level of consciousness and vital signs were monitored continuously by radiology nursing throughout the procedure under my direct supervision. CONTRAST:  20 mL Omnipaque 300 FLUOROSCOPY: Radiation Exposure Index (as provided by the fluoroscopic device): 2,819 mGy Kerma COMPLICATIONS: None immediate. PROCEDURE: Informed consent was obtained from the patient following explanation of the procedure, risks, benefits and alternatives. The patient understands, agrees and consents for the procedure. All questions were addressed. A time out was performed prior to the initiation of the procedure. Maximal barrier sterile technique utilized including caps, mask, sterile gowns, sterile gloves, large sterile drape,  hand hygiene, and Betadine prep. The left wrist was prepped and draped in standard fashion. Pulse oximeter was attached to the left thumb. Tora Perches test was performed, grade A. The left radial artery measured 0.4 cm in diameter. Subdermal Local anesthesia was provided at the planned needle entry site with 1% lidocaine. A small skin nick was made. Under direct ultrasound visualization, the left radial artery was punctured with a 21 gauge micropuncture needle. A permanent image was captured and stored in the record. A microwire was placed and exchanged for a 4/5 French slender sheath. The sheath was flushed followed by installation of standard radial cocktail. A 5 French MG2 glide catheter and Bentson wire were directed under fluoroscopic visualization through the left upper extremity, around the aortic arch, through the descending thoracic aorta and into the abdominal aorta. The catheter was then directed into the right internal iliac artery. Right internal iliac artery angiogram demonstrated patency of the anterior and posterior divisions with the prostatic artery appearing to arise from the internal pudendal artery. A 1.9 French Progreat lambda microcatheter and standard 0.014 inch air Stahl wire within directed into the right internal pudendal artery. Angiogram was performed which demonstrated patency and origination of the right prostatic artery. The right prostatic artery was then selected and right prostatic artery angiogram was performed. Cone beam CT was then performed which demonstrated a proximal inferior rectal branch about the prostatic artery. There are no additional evidence of nontarget branch vessels. The inferior rectal artery was then selected and angiogram was performed which demonstrated no evidence of nontarget branch vessels. Next, proximal embolization of the right inferior rectal artery was performed with a single 1 mm x 5 mm low profile Ruby detachable coil. The catheter was retracted into the  parent vessel and injection of contrast demonstrated adequate embolization and preserved patency of the right prostatic artery. The microcatheter was then directed deeper into the distal prostatic artery and particle embolization with dilute 400 micron hydro pleural see years was performed until stasis was achieved. The catheter was then retracted into the proximal prostatic artery and completion arteriogram demonstrated adequate embolization of the prostatic parenchyma. The microcatheter was then removed. The 5 French base catheter was then directed under fluoroscopic guidance to the left internal iliac artery. Angiogram was performed which demonstrated patency of the posterior and anterior divisions with  the prostatic artery arising from the mid obturator artery. The obturator artery was then selected with the straight lambda Progreat microcatheter and Aristotle 14 microwire. Angiogram was performed in different obliquities to display the origin of the prostatic artery. There were several unsuccessful attempts with cannulating the prostatic artery including use of a fathom 14 microwire. Therefore, the catheter was exchanged for a triple angle lambda microcatheter which was then able to successfully select the prostatic artery origin with wireless technique. Angiogram was performed of the prostatic artery which demonstrated opacification of the prostate and no evidence of nontarget branch vessels. There was some spasm within the prostatic artery, therefore 200 mcg of nitroglycerin was administered intra-arterially. Next, particle embolization of the left hemi prostate was performed with 400 micron hydro pearls. Due to the spasm visualized on prior arteriogram, the proximal prostatic artery was embolized with several low-profile Ruby detachable microcoils to ensure complete embolization. The catheter was retracted into the obturator artery and completion angiogram was performed which demonstrated appropriate and  complete embolization of the left prostatic artery. The catheters were removed. A TR band was placed about the left wrist and inflated than the indwelling slender sheath was removed successfully. The patient tolerated the procedure well was transferred back to the floor in good condition. IMPRESSION: 1. Technically successful particle embolization of the right prostatic artery. 2. Shared origin of the right prostatic artery and right inferior rectal artery necessitating coil embolization of the right inferior rectal artery for embolic protection purposes. 3. Technically successful particle and coil embolization of the left prostatic artery. Marliss Coots, MD [facility] Electronically Signed   By: Marliss Coots M.D.   On: 01/17/2023 07:28   IR Angiogram Selective Each Additional Vessel  Result Date: 01/17/2023 INDICATION: 77 year old male with history of prostatic hematuria, currently admitted as an inpatient. EXAM: 1. Ultrasound-guided vascular access of the left radial artery. 2. Selective catheterization and angiography of the right internal iliac, right internal pudendal, right inferior rectal, right prostatic, left common iliac, left internal iliac, left obturator, and left prostatic arteries. 3. Cone beam CT. 4. Coil embolization of right inferior rectal artery. 5. Right prostatic artery embolization. 6. Left prostatic artery embolization. MEDICATIONS: 200 mg ciprofloxacin, intravenous ANESTHESIA/SEDATION: Moderate (conscious) sedation was employed during this procedure. A total of Versed 2.5 mg and Fentanyl 100 mcg was administered intravenously. Moderate Sedation Time: 103 minutes. The patient's level of consciousness and vital signs were monitored continuously by radiology nursing throughout the procedure under my direct supervision. CONTRAST:  20 mL Omnipaque 300 FLUOROSCOPY: Radiation Exposure Index (as provided by the fluoroscopic device):  2,819 mGy Kerma COMPLICATIONS: None immediate. PROCEDURE: Informed consent was obtained from the patient following explanation of the procedure, risks, benefits and alternatives. The patient understands, agrees and consents for the procedure. All questions were addressed. A time out was performed prior to the initiation of the procedure. Maximal barrier sterile technique utilized including caps, mask, sterile gowns, sterile gloves, large sterile drape, hand hygiene, and Betadine prep. The left wrist was prepped and draped in standard fashion. Pulse oximeter was attached to the left thumb. Tora Perches test was performed, grade A. The left radial artery measured 0.4 cm in diameter. Subdermal Local anesthesia was provided at the planned needle entry site with 1% lidocaine. A small skin nick was made. Under direct ultrasound visualization, the left radial artery was punctured with a 21 gauge micropuncture needle. A permanent image was captured and stored in the record. A microwire was placed  and exchanged for a 4/5 French slender sheath. The sheath was flushed followed by installation of standard radial cocktail. A 5 French MG2 glide catheter and Bentson wire were directed under fluoroscopic visualization through the left upper extremity, around the aortic arch, through the descending thoracic aorta and into the abdominal aorta. The catheter was then directed into the right internal iliac artery. Right internal iliac artery angiogram demonstrated patency of the anterior and posterior divisions with the prostatic artery appearing to arise from the internal pudendal artery. A 1.9 French Progreat lambda microcatheter and standard 0.014 inch air Stahl wire within directed into the right internal pudendal artery. Angiogram was performed which demonstrated patency and origination of the right prostatic artery. The right prostatic artery was then selected and right prostatic artery angiogram was performed. Cone beam CT was then  performed which demonstrated a proximal inferior rectal branch about the prostatic artery. There are no additional evidence of nontarget branch vessels. The inferior rectal artery was then selected and angiogram was performed which demonstrated no evidence of nontarget branch vessels. Next, proximal embolization of the right inferior rectal artery was performed with a single 1 mm x 5 mm low profile Ruby detachable coil. The catheter was retracted into the parent vessel and injection of contrast demonstrated adequate embolization and preserved patency of the right prostatic artery. The microcatheter was then directed deeper into the distal prostatic artery and particle embolization with dilute 400 micron hydro pleural see years was performed until stasis was achieved. The catheter was then retracted into the proximal prostatic artery and completion arteriogram demonstrated adequate embolization of the prostatic parenchyma. The microcatheter was then removed. The 5 French base catheter was then directed under fluoroscopic guidance to the left internal iliac artery. Angiogram was performed which demonstrated patency of the posterior and anterior divisions with the prostatic artery arising from the mid obturator artery. The obturator artery was then selected with the straight lambda Progreat microcatheter and Aristotle 14 microwire. Angiogram was performed in different obliquities to display the origin of the prostatic artery. There were several unsuccessful attempts with cannulating the prostatic artery including use of a fathom 14 microwire. Therefore, the catheter was exchanged for a triple angle lambda microcatheter which was then able to successfully select the prostatic artery origin with wireless technique. Angiogram was performed of the prostatic artery which demonstrated opacification of the prostate and no evidence of nontarget branch vessels. There was some spasm within the prostatic artery, therefore 200 mcg  of nitroglycerin was administered intra-arterially. Next, particle embolization of the left hemi prostate was performed with 400 micron hydro pearls. Due to the spasm visualized on prior arteriogram, the proximal prostatic artery was embolized with several low-profile Ruby detachable microcoils to ensure complete embolization. The catheter was retracted into the obturator artery and completion angiogram was performed which demonstrated appropriate and complete embolization of the left prostatic artery. The catheters were removed. A TR band was placed about the left wrist and inflated than the indwelling slender sheath was removed successfully. The patient tolerated the procedure well was transferred back to the floor in good condition. IMPRESSION: 1. Technically successful particle embolization of the right prostatic artery. 2. Shared origin of the right prostatic artery and right inferior rectal artery necessitating coil embolization of the right inferior rectal artery for embolic protection purposes. 3. Technically successful particle and coil embolization of the left prostatic artery. Marliss Coots, MD [facility] Electronically Signed   By: Jae Dire.D.  On: 01/17/2023 07:28   IR Angiogram Selective Each Additional Vessel  Result Date: 01/17/2023 INDICATION: 77 year old male with history of prostatic hematuria, currently admitted as an inpatient. EXAM: 1. Ultrasound-guided vascular access of the left radial artery. 2. Selective catheterization and angiography of the right internal iliac, right internal pudendal, right inferior rectal, right prostatic, left common iliac, left internal iliac, left obturator, and left prostatic arteries. 3. Cone beam CT. 4. Coil embolization of right inferior rectal artery. 5. Right prostatic artery embolization. 6. Left prostatic artery embolization. MEDICATIONS: 200 mg ciprofloxacin, intravenous  ANESTHESIA/SEDATION: Moderate (conscious) sedation was employed during this procedure. A total of Versed 2.5 mg and Fentanyl 100 mcg was administered intravenously. Moderate Sedation Time: 103 minutes. The patient's level of consciousness and vital signs were monitored continuously by radiology nursing throughout the procedure under my direct supervision. CONTRAST:  20 mL Omnipaque 300 FLUOROSCOPY: Radiation Exposure Index (as provided by the fluoroscopic device): 2,819 mGy Kerma COMPLICATIONS: None immediate. PROCEDURE: Informed consent was obtained from the patient following explanation of the procedure, risks, benefits and alternatives. The patient understands, agrees and consents for the procedure. All questions were addressed. A time out was performed prior to the initiation of the procedure. Maximal barrier sterile technique utilized including caps, mask, sterile gowns, sterile gloves, large sterile drape, hand hygiene, and Betadine prep. The left wrist was prepped and draped in standard fashion. Pulse oximeter was attached to the left thumb. Tora Perches test was performed, grade A. The left radial artery measured 0.4 cm in diameter. Subdermal Local anesthesia was provided at the planned needle entry site with 1% lidocaine. A small skin nick was made. Under direct ultrasound visualization, the left radial artery was punctured with a 21 gauge micropuncture needle. A permanent image was captured and stored in the record. A microwire was placed and exchanged for a 4/5 French slender sheath. The sheath was flushed followed by installation of standard radial cocktail. A 5 French MG2 glide catheter and Bentson wire were directed under fluoroscopic visualization through the left upper extremity, around the aortic arch, through the descending thoracic aorta and into the abdominal aorta. The catheter was then directed into the right internal iliac artery. Right internal iliac artery angiogram demonstrated patency of the  anterior and posterior divisions with the prostatic artery appearing to arise from the internal pudendal artery. A 1.9 French Progreat lambda microcatheter and standard 0.014 inch air Stahl wire within directed into the right internal pudendal artery. Angiogram was performed which demonstrated patency and origination of the right prostatic artery. The right prostatic artery was then selected and right prostatic artery angiogram was performed. Cone beam CT was then performed which demonstrated a proximal inferior rectal branch about the prostatic artery. There are no additional evidence of nontarget branch vessels. The inferior rectal artery was then selected and angiogram was performed which demonstrated no evidence of nontarget branch vessels. Next, proximal embolization of the right inferior rectal artery was performed with a single 1 mm x 5 mm low profile Ruby detachable coil. The catheter was retracted into the parent vessel and injection of contrast demonstrated adequate embolization and preserved patency of the right prostatic artery. The microcatheter was then directed deeper into the distal prostatic artery and particle embolization with dilute 400 micron hydro pleural see years was performed until stasis was achieved. The catheter was then retracted into the proximal prostatic artery and completion arteriogram demonstrated adequate embolization of the prostatic parenchyma. The microcatheter was then removed. The 5 French base catheter was then  directed under fluoroscopic guidance to the left internal iliac artery. Angiogram was performed which demonstrated patency of the posterior and anterior divisions with the prostatic artery arising from the mid obturator artery. The obturator artery was then selected with the straight lambda Progreat microcatheter and Aristotle 14 microwire. Angiogram was performed in different obliquities to display the origin of the prostatic artery. There were several unsuccessful  attempts with cannulating the prostatic artery including use of a fathom 14 microwire. Therefore, the catheter was exchanged for a triple angle lambda microcatheter which was then able to successfully select the prostatic artery origin with wireless technique. Angiogram was performed of the prostatic artery which demonstrated opacification of the prostate and no evidence of nontarget branch vessels. There was some spasm within the prostatic artery, therefore 200 mcg of nitroglycerin was administered intra-arterially. Next, particle embolization of the left hemi prostate was performed with 400 micron hydro pearls. Due to the spasm visualized on prior arteriogram, the proximal prostatic artery was embolized with several low-profile Ruby detachable microcoils to ensure complete embolization. The catheter was retracted into the obturator artery and completion angiogram was performed which demonstrated appropriate and complete embolization of the left prostatic artery. The catheters were removed. A TR band was placed about the left wrist and inflated than the indwelling slender sheath was removed successfully. The patient tolerated the procedure well was transferred back to the floor in good condition. IMPRESSION: 1. Technically successful particle embolization of the right prostatic artery. 2. Shared origin of the right prostatic artery and right inferior rectal artery necessitating coil embolization of the right inferior rectal artery for embolic protection purposes. 3. Technically successful particle and coil embolization of the left prostatic artery. Marliss Coots, MD [facility] Electronically Signed   By: Marliss Coots M.D.   On: 01/17/2023 07:28   IR EMBO TUMOR ORGAN ISCHEMIA INFARCT INC GUIDE ROADMAPPING  Result Date: 01/17/2023 INDICATION: 77 year old male with history of prostatic hematuria, currently admitted as an inpatient. EXAM: 1.  Ultrasound-guided vascular access of the left radial artery. 2. Selective catheterization and angiography of the right internal iliac, right internal pudendal, right inferior rectal, right prostatic, left common iliac, left internal iliac, left obturator, and left prostatic arteries. 3. Cone beam CT. 4. Coil embolization of right inferior rectal artery. 5. Right prostatic artery embolization. 6. Left prostatic artery embolization. MEDICATIONS: 200 mg ciprofloxacin, intravenous ANESTHESIA/SEDATION: Moderate (conscious) sedation was employed during this procedure. A total of Versed 2.5 mg and Fentanyl 100 mcg was administered intravenously. Moderate Sedation Time: 103 minutes. The patient's level of consciousness and vital signs were monitored continuously by radiology nursing throughout the procedure under my direct supervision. CONTRAST:  20 mL Omnipaque 300 FLUOROSCOPY: Radiation Exposure Index (as provided by the fluoroscopic device): 2,819 mGy Kerma COMPLICATIONS: None immediate. PROCEDURE: Informed consent was obtained from the patient following explanation of the procedure, risks, benefits and alternatives. The patient understands, agrees and consents for the procedure. All questions were addressed. A time out was performed prior to the initiation of the procedure. Maximal barrier sterile technique utilized including caps, mask, sterile gowns, sterile gloves, large sterile drape, hand hygiene, and Betadine prep. The left wrist was prepped and draped in standard fashion. Pulse oximeter was attached to the left thumb. Tora Perches test was performed, grade A. The left radial artery measured 0.4 cm in diameter. Subdermal Local anesthesia was provided at the planned needle entry site with 1% lidocaine. A small skin nick was made. Under direct ultrasound visualization,  the left radial artery was punctured with a 21 gauge micropuncture needle. A permanent image was captured and stored in the record. A microwire was placed  and exchanged for a 4/5 French slender sheath. The sheath was flushed followed by installation of standard radial cocktail. A 5 French MG2 glide catheter and Bentson wire were directed under fluoroscopic visualization through the left upper extremity, around the aortic arch, through the descending thoracic aorta and into the abdominal aorta. The catheter was then directed into the right internal iliac artery. Right internal iliac artery angiogram demonstrated patency of the anterior and posterior divisions with the prostatic artery appearing to arise from the internal pudendal artery. A 1.9 French Progreat lambda microcatheter and standard 0.014 inch air Stahl wire within directed into the right internal pudendal artery. Angiogram was performed which demonstrated patency and origination of the right prostatic artery. The right prostatic artery was then selected and right prostatic artery angiogram was performed. Cone beam CT was then performed which demonstrated a proximal inferior rectal branch about the prostatic artery. There are no additional evidence of nontarget branch vessels. The inferior rectal artery was then selected and angiogram was performed which demonstrated no evidence of nontarget branch vessels. Next, proximal embolization of the right inferior rectal artery was performed with a single 1 mm x 5 mm low profile Ruby detachable coil. The catheter was retracted into the parent vessel and injection of contrast demonstrated adequate embolization and preserved patency of the right prostatic artery. The microcatheter was then directed deeper into the distal prostatic artery and particle embolization with dilute 400 micron hydro pleural see years was performed until stasis was achieved. The catheter was then retracted into the proximal prostatic artery and completion arteriogram demonstrated adequate embolization of the prostatic parenchyma. The microcatheter was then removed. The 5 French base catheter  was then directed under fluoroscopic guidance to the left internal iliac artery. Angiogram was performed which demonstrated patency of the posterior and anterior divisions with the prostatic artery arising from the mid obturator artery. The obturator artery was then selected with the straight lambda Progreat microcatheter and Aristotle 14 microwire. Angiogram was performed in different obliquities to display the origin of the prostatic artery. There were several unsuccessful attempts with cannulating the prostatic artery including use of a fathom 14 microwire. Therefore, the catheter was exchanged for a triple angle lambda microcatheter which was then able to successfully select the prostatic artery origin with wireless technique. Angiogram was performed of the prostatic artery which demonstrated opacification of the prostate and no evidence of nontarget branch vessels. There was some spasm within the prostatic artery, therefore 200 mcg of nitroglycerin was administered intra-arterially. Next, particle embolization of the left hemi prostate was performed with 400 micron hydro pearls. Due to the spasm visualized on prior arteriogram, the proximal prostatic artery was embolized with several low-profile Ruby detachable microcoils to ensure complete embolization. The catheter was retracted into the obturator artery and completion angiogram was performed which demonstrated appropriate and complete embolization of the left prostatic artery. The catheters were removed. A TR band was placed about the left wrist and inflated than the indwelling slender sheath was removed successfully. The patient tolerated the procedure well was transferred back to the floor in good condition. IMPRESSION: 1. Technically successful particle embolization of the right prostatic artery. 2. Shared origin of the right prostatic artery and right inferior rectal artery necessitating coil embolization of the right inferior rectal artery for embolic  protection purposes. 3. Technically successful particle and  coil embolization of the left prostatic artery. Marliss Coots, MD [facility] Electronically Signed   By: Marliss Coots M.D.   On: 01/17/2023 07:28   IR EMBO TUMOR ORGAN ISCHEMIA INFARCT INC GUIDE ROADMAPPING  Result Date: 01/17/2023 INDICATION: 77 year old male with history of prostatic hematuria, currently admitted as an inpatient. EXAM: 1. Ultrasound-guided vascular access of the left radial artery. 2. Selective catheterization and angiography of the right internal iliac, right internal pudendal, right inferior rectal, right prostatic, left common iliac, left internal iliac, left obturator, and left prostatic arteries. 3. Cone beam CT. 4. Coil embolization of right inferior rectal artery. 5. Right prostatic artery embolization. 6. Left prostatic artery embolization. MEDICATIONS: 200 mg ciprofloxacin, intravenous ANESTHESIA/SEDATION: Moderate (conscious) sedation was employed during this procedure. A total of Versed 2.5 mg and Fentanyl 100 mcg was administered intravenously. Moderate Sedation Time: 103 minutes. The patient's level of consciousness and vital signs were monitored continuously by radiology nursing throughout the procedure under my direct supervision. CONTRAST:  20 mL Omnipaque 300 FLUOROSCOPY: Radiation Exposure Index (as provided by the fluoroscopic device): 2,819 mGy Kerma COMPLICATIONS: None immediate. PROCEDURE: Informed consent was obtained from the patient following explanation of the procedure, risks, benefits and alternatives. The patient understands, agrees and consents for the procedure. All questions were addressed. A time out was performed prior to the initiation of the procedure. Maximal barrier sterile technique utilized including caps, mask, sterile gowns, sterile gloves, large sterile drape, hand hygiene, and Betadine prep. The left wrist was prepped and draped in  standard fashion. Pulse oximeter was attached to the left thumb. Tora Perches test was performed, grade A. The left radial artery measured 0.4 cm in diameter. Subdermal Local anesthesia was provided at the planned needle entry site with 1% lidocaine. A small skin nick was made. Under direct ultrasound visualization, the left radial artery was punctured with a 21 gauge micropuncture needle. A permanent image was captured and stored in the record. A microwire was placed and exchanged for a 4/5 French slender sheath. The sheath was flushed followed by installation of standard radial cocktail. A 5 French MG2 glide catheter and Bentson wire were directed under fluoroscopic visualization through the left upper extremity, around the aortic arch, through the descending thoracic aorta and into the abdominal aorta. The catheter was then directed into the right internal iliac artery. Right internal iliac artery angiogram demonstrated patency of the anterior and posterior divisions with the prostatic artery appearing to arise from the internal pudendal artery. A 1.9 French Progreat lambda microcatheter and standard 0.014 inch air Stahl wire within directed into the right internal pudendal artery. Angiogram was performed which demonstrated patency and origination of the right prostatic artery. The right prostatic artery was then selected and right prostatic artery angiogram was performed. Cone beam CT was then performed which demonstrated a proximal inferior rectal branch about the prostatic artery. There are no additional evidence of nontarget branch vessels. The inferior rectal artery was then selected and angiogram was performed which demonstrated no evidence of nontarget branch vessels. Next, proximal embolization of the right inferior rectal artery was performed with a single 1 mm x 5 mm low profile Ruby detachable coil. The catheter was retracted into the parent vessel and injection of contrast demonstrated adequate  embolization and preserved patency of the right prostatic artery. The microcatheter was then directed deeper into the distal prostatic artery and particle embolization with dilute 400 micron hydro pleural see years was performed until stasis was achieved. The  catheter was then retracted into the proximal prostatic artery and completion arteriogram demonstrated adequate embolization of the prostatic parenchyma. The microcatheter was then removed. The 5 French base catheter was then directed under fluoroscopic guidance to the left internal iliac artery. Angiogram was performed which demonstrated patency of the posterior and anterior divisions with the prostatic artery arising from the mid obturator artery. The obturator artery was then selected with the straight lambda Progreat microcatheter and Aristotle 14 microwire. Angiogram was performed in different obliquities to display the origin of the prostatic artery. There were several unsuccessful attempts with cannulating the prostatic artery including use of a fathom 14 microwire. Therefore, the catheter was exchanged for a triple angle lambda microcatheter which was then able to successfully select the prostatic artery origin with wireless technique. Angiogram was performed of the prostatic artery which demonstrated opacification of the prostate and no evidence of nontarget branch vessels. There was some spasm within the prostatic artery, therefore 200 mcg of nitroglycerin was administered intra-arterially. Next, particle embolization of the left hemi prostate was performed with 400 micron hydro pearls. Due to the spasm visualized on prior arteriogram, the proximal prostatic artery was embolized with several low-profile Ruby detachable microcoils to ensure complete embolization. The catheter was retracted into the obturator artery and completion angiogram was performed which demonstrated appropriate and complete embolization of the left prostatic artery. The catheters  were removed. A TR band was placed about the left wrist and inflated than the indwelling slender sheath was removed successfully. The patient tolerated the procedure well was transferred back to the floor in good condition. IMPRESSION: 1. Technically successful particle embolization of the right prostatic artery. 2. Shared origin of the right prostatic artery and right inferior rectal artery necessitating coil embolization of the right inferior rectal artery for embolic protection purposes. 3. Technically successful particle and coil embolization of the left prostatic artery. Marliss Coots, MD [facility] Radiology Electronically Signed   By: Marliss Coots M.D.   On: 01/17/2023 07:28   IR Angiogram Selective Each Additional Vessel  Result Date: 01/17/2023 INDICATION: 77 year old male with history of prostatic hematuria, currently admitted as an inpatient. EXAM: 1. Ultrasound-guided vascular access of the left radial artery. 2. Selective catheterization and angiography of the right internal iliac, right internal pudendal, right inferior rectal, right prostatic, left common iliac, left internal iliac, left obturator, and left prostatic arteries. 3. Cone beam CT. 4. Coil embolization of right inferior rectal artery. 5. Right prostatic artery embolization. 6. Left prostatic artery embolization. MEDICATIONS: 200 mg ciprofloxacin, intravenous ANESTHESIA/SEDATION: Moderate (conscious) sedation was employed during this procedure. A total of Versed 2.5 mg and Fentanyl 100 mcg was administered intravenously. Moderate Sedation Time: 103 minutes. The patient's level of consciousness and vital signs were monitored continuously by radiology nursing throughout the procedure under my direct supervision. CONTRAST:  20 mL Omnipaque 300 FLUOROSCOPY: Radiation Exposure Index (as provided by the fluoroscopic device): 2,819 mGy Kerma COMPLICATIONS: None immediate. PROCEDURE: Informed  consent was obtained from the patient following explanation of the procedure, risks, benefits and alternatives. The patient understands, agrees and consents for the procedure. All questions were addressed. A time out was performed prior to the initiation of the procedure. Maximal barrier sterile technique utilized including caps, mask, sterile gowns, sterile gloves, large sterile drape, hand hygiene, and Betadine prep. The left wrist was prepped and draped in standard fashion. Pulse oximeter was attached to the left thumb. Tora Perches test was performed, grade A. The left radial artery measured  0.4 cm in diameter. Subdermal Local anesthesia was provided at the planned needle entry site with 1% lidocaine. A small skin nick was made. Under direct ultrasound visualization, the left radial artery was punctured with a 21 gauge micropuncture needle. A permanent image was captured and stored in the record. A microwire was placed and exchanged for a 4/5 French slender sheath. The sheath was flushed followed by installation of standard radial cocktail. A 5 French MG2 glide catheter and Bentson wire were directed under fluoroscopic visualization through the left upper extremity, around the aortic arch, through the descending thoracic aorta and into the abdominal aorta. The catheter was then directed into the right internal iliac artery. Right internal iliac artery angiogram demonstrated patency of the anterior and posterior divisions with the prostatic artery appearing to arise from the internal pudendal artery. A 1.9 French Progreat lambda microcatheter and standard 0.014 inch air Stahl wire within directed into the right internal pudendal artery. Angiogram was performed which demonstrated patency and origination of the right prostatic artery. The right prostatic artery was then selected and right prostatic artery angiogram was performed. Cone beam CT was then performed which demonstrated a proximal inferior rectal branch about  the prostatic artery. There are no additional evidence of nontarget branch vessels. The inferior rectal artery was then selected and angiogram was performed which demonstrated no evidence of nontarget branch vessels. Next, proximal embolization of the right inferior rectal artery was performed with a single 1 mm x 5 mm low profile Ruby detachable coil. The catheter was retracted into the parent vessel and injection of contrast demonstrated adequate embolization and preserved patency of the right prostatic artery. The microcatheter was then directed deeper into the distal prostatic artery and particle embolization with dilute 400 micron hydro pleural see years was performed until stasis was achieved. The catheter was then retracted into the proximal prostatic artery and completion arteriogram demonstrated adequate embolization of the prostatic parenchyma. The microcatheter was then removed. The 5 French base catheter was then directed under fluoroscopic guidance to the left internal iliac artery. Angiogram was performed which demonstrated patency of the posterior and anterior divisions with the prostatic artery arising from the mid obturator artery. The obturator artery was then selected with the straight lambda Progreat microcatheter and Aristotle 14 microwire. Angiogram was performed in different obliquities to display the origin of the prostatic artery. There were several unsuccessful attempts with cannulating the prostatic artery including use of a fathom 14 microwire. Therefore, the catheter was exchanged for a triple angle lambda microcatheter which was then able to successfully select the prostatic artery origin with wireless technique. Angiogram was performed of the prostatic artery which demonstrated opacification of the prostate and no evidence of nontarget branch vessels. There was some spasm within the prostatic artery, therefore 200 mcg of nitroglycerin was administered intra-arterially. Next, particle  embolization of the left hemi prostate was performed with 400 micron hydro pearls. Due to the spasm visualized on prior arteriogram, the proximal prostatic artery was embolized with several low-profile Ruby detachable microcoils to ensure complete embolization. The catheter was retracted into the obturator artery and completion angiogram was performed which demonstrated appropriate and complete embolization of the left prostatic artery. The catheters were removed. A TR band was placed about the left wrist and inflated than the indwelling slender sheath was removed successfully. The patient tolerated the procedure well was transferred back to the floor in good condition. IMPRESSION: 1. Technically successful particle embolization of the right prostatic artery. 2. Shared origin of  the right prostatic artery and right inferior rectal artery necessitating coil embolization of the right inferior rectal artery for embolic protection purposes. 3. Technically successful particle and coil embolization of the left prostatic artery. Marliss Coots, MD [facility] Radiology Electronically Signed   By: Marliss Coots M.D.   On: 01/17/2023 07:28    Assessment/Plan: #SPT Healing appropriately.  There is a little bit of fibrinous exudate and catheter is frequently pinned between his abdomen and pubis, causing some minor erosion on the lateral ends of the incision.  Urine remains clear  # Urethral bleeding- Resolved Necrotic area of prostatic urethra was resected.  It appears that during coughing fit patient  bled heavily from this area.  HgB stable. Daily labs His Xarelto is on hold.  S/p prostate artery embolization. Urethral foley removed this morning. No sign of urethral bleeding. Called for obstruction of SPT yesterday. No obstruction on arrival. Reported to have required hand irrigation last night. Foley was folded in half and pinched in the retention clamp for some  reason on rounds. I placed back to drainage. Some scant hematuria was seen in the bag. If patient continues to obstruct SPT d/t debris, will request dilation and upsize of foley with IR.  Pt to follow up with schedule appt in around a week.  Pt ok to discharge from a Urologic perspective. Please call with questions.     LOS: 10 days   Elmon Kirschner, NP [facility] Pager: [phone]  01/18/2023, 9:28 AM

## 2023-01-18 NOTE — Progress Notes (Signed)
Mobility Specialist - Progress Note   01/18/23 1415  Mobility  Activity Ambulated with assistance in hallway  Level of Assistance Minimal assist, patient does 75% or more  Assistive Device Front wheel walker  Distance Ambulated (ft) 50 ft  Range of Motion/Exercises Active Assistive  Activity Response Tolerated fair  Mobility Referral Yes  $Mobility charge 1 Mobility  Mobility Specialist Start Time (ACUTE ONLY) 1358  Mobility Specialist Stop Time (ACUTE ONLY) 1415  Mobility Specialist Time Calculation (min) (ACUTE ONLY) 17 min   Pt was found on recliner chair and agreeable to ambulate. Was min-A for STS and CG for ambulation. Grew very fatigued with session. At EOS returned to bed with all needs met. Call bell in reach.  Billey Chang Mobility Specialist

## 2023-01-18 NOTE — [place] FL2 (Signed)
[place] MEDICAID FL2 LEVEL OF CARE FORM     IDENTIFICATION  Patient Name: Calvin [name] Birthdate: [birth year] Sex: male Admission Date (Current Location): 01/07/2023  [facility] and [place] Number:  Producer, television/film/video and Address:  [facility],  [address] [place], [place] [address]      Provider Number: [id number]  Attending Physician Name and Address:  Lewie Chamber, MD  Relative Name and Phone Number:  Donneta Romberg (Daughter)  [phone] (Mobile    Current Level of Care: Hospital Recommended Level of Care: Skilled Nursing Facility Prior Approval Number:    Date Approved/Denied:   PASRR Number: [id number]  Discharge Plan: SNF    Current Diagnoses: Patient Active Problem List   Diagnosis Date Noted   Urethral bleeding 01/10/2023   Catheter-associated urinary tract infection (HCC) 01/08/2023   SIRS (systemic inflammatory response syndrome) (HCC) 01/08/2023   Hypoxia 01/08/2023   Hyponatremia 01/08/2023   Hypokalemia 01/08/2023   Elevated brain natriuretic peptide (BNP) level 01/08/2023   A-fib (HCC) 01/06/2023   Atrial fibrillation with rapid ventricular response (HCC) 01/05/2023   Tinea cruris 11/27/2022   Hematuria of unknown cause 10/29/2022   Gross hematuria 10/28/2022   Hypertensive urgency 10/28/2022   Mixed hyperlipidemia 10/28/2022   Closed fracture of lateral malleolus of left fibula 06/15/2022   CVA (cerebral vascular accident) (HCC) 05/29/2022   Pressure injury of skin 05/29/2022   Acute CVA (cerebrovascular accident) (HCC) 05/27/2022   UTI (urinary tract infection) 05/27/2022   Indwelling Foley catheter present 05/27/2022   Bladder outflow obstruction 03/18/2022   B12 deficiency 02/06/2022   Bilateral leg weakness 01/10/2022   Depression 12/25/2021   Alcohol abuse 12/25/2021   Primary osteoarthritis of left knee 04/23/2018   Hypogonadism in male 01/19/2016   Foot swelling 01/19/2016   Insomnia 10/30/2013   INTERNAL  HEMORRHOIDS WITH OTHER COMPLICATION 03/29/2010   Enlarged prostate 10/25/2008   Headache 09/15/2007   Essential hypertension 07/07/2007    Orientation RESPIRATION BLADDER Height & Weight     Self, Time, Situation, Place  Normal Continent Weight: 252 lb (114.3 kg) Height:  6' (182.9 cm)  BEHAVIORAL SYMPTOMS/MOOD NEUROLOGICAL BOWEL NUTRITION STATUS      Continent Diet (regular)  AMBULATORY STATUS COMMUNICATION OF NEEDS Skin   Limited Assist Verbally Normal                       Personal Care Assistance Level of Assistance  Bathing, Dressing, Feeding Bathing Assistance: Limited assistance Feeding assistance: Independent Dressing Assistance: Limited assistance     Functional Limitations Info  Sight, Hearing, Speech Sight Info: Adequate Hearing Info: Adequate Speech Info: Adequate    SPECIAL CARE FACTORS FREQUENCY  PT (By licensed PT), OT (By licensed OT)     PT Frequency: 5 x a week OT Frequency: 5 x a week            Contractures Contractures Info: Not present    Additional Factors Info  Code Status, Allergies Code Status Info: full Allergies Info: NKA           Current Medications (01/18/2023):  This is the current hospital active medication list Current Facility-Administered Medications  Medication Dose Route Frequency Provider Last Rate Last Admin   acetaminophen (TYLENOL) tablet 650 mg  650 mg Oral Q6H PRN John Giovanni, MD   650 mg at 01/15/23 0410   Or   acetaminophen (TYLENOL) suppository 650 mg  650 mg Rectal Q6H PRN John Giovanni, MD  benzonatate (TESSALON) capsule 100 mg  100 mg Oral TID Lorin Glass, MD   100 mg at 01/18/23 0912   Chlorhexidine Gluconate Cloth 2 % PADS 6 each  6 each Topical Daily Dahal, Binaya, MD   6 each at 01/18/23 0913   dextromethorphan-guaiFENesin (MUCINEX DM) 30-600 MG per 12 hr tablet 1 tablet  1 tablet Oral BID Lorin Glass, MD   1 tablet at 01/18/23 0912   diltiazem (CARDIZEM CD) 24 hr capsule 180 mg   180 mg Oral Daily Rhetta Mura, MD   180 mg at 01/18/23 0912   fiber supplement (BANATROL TF) liquid 60 mL  60 mL Oral BID Lorin Glass, MD   60 mL at 01/18/23 0912   finasteride (PROSCAR) tablet 5 mg  5 mg Oral Daily Malen Gauze, MD   5 mg at 01/18/23 0912   furosemide (LASIX) tablet 20 mg  20 mg Oral Daily Lewie Chamber, MD   20 mg at 01/18/23 0912   iohexol (OMNIPAQUE) 300 MG/ML solution 100 mL  100 mL Intra-arterial Once PRN Suttle, Thressa Sheller, MD       iohexol (OMNIPAQUE) 300 MG/ML solution 100 mL  100 mL Intra-arterial Once PRN Suttle, Thressa Sheller, MD       levalbuterol (XOPENEX) nebulizer solution 0.63 mg  0.63 mg Nebulization Q6H PRN Luiz Iron, NP   0.63 mg at 01/13/23 2341   metoprolol tartrate (LOPRESSOR) injection 5 mg  5 mg Intravenous Q4H PRN Rhetta Mura, MD       ondansetron (ZOFRAN) injection 4 mg  4 mg Intravenous Q6H PRN John Giovanni, MD       Oral care mouth rinse  15 mL Mouth Rinse PRN Dahal, Melina Schools, MD       rivaroxaban (XARELTO) tablet 20 mg  20 mg Oral Q supper Lewie Chamber, MD         Discharge Medications: Please see discharge summary for a list of discharge medications.  Relevant Imaging Results:  Relevant Lab Results:   Additional Information SSN [id number]  Valentina Shaggy , LCSW

## 2023-01-18 NOTE — Plan of Care (Signed)

## 2023-01-18 NOTE — Progress Notes (Signed)
Progress Note    Calvin [name]   JJO:[id number]  DOB: [birth year]  DOA: 01/07/2023     10 PCP: [name], MD  Initial CC: fever  Hospital Course: Calvin [name] is a 77 yo male with PMH ischemic stroke 05/2022, BPH, neurogenic bladder now with suprapubic cath in place since 12/25/22 and HTN.  Recently admitted 7/20-7/21 due to obstruction of his suprapubic catheter for which he was evaluated by urology and found to have encrusted suprapubic tube which was exchanged.  He was also found to have new onset A-fib with RVR and was discharged on oral Cardizem and Xarelto. Next day, 7/22, patient was brought to the ED with a fever 100.9, lethargy and redness and malodor noted at the site of suprapubic catheter insertion.   In the ED, he was afebrile but tachycardic, tachypneic, he was noted to have a low O2 sat of 89% on room air, placed on 2 L oxygen nasal cannula. Labs showed WBC 13.1, sodium 130, lactate normal x 2. UA with positive nitrite, moderate leukocytes, and microscopy showing 21-50 RBCs, 21-50 WBCs, and many bacteria.  Urine culture grew multiple species and he was continued on an empiric course.   He also developed urethral bleeding and underwent evaluation with IR and underwent bilateral prostate artery embolization and coil embolization of right inferior rectal artery on 01/16/2023.  Interval History:  No events overnight.  Foley removed from penis per urology today.  Suprapubic remains in place and draining dark-tinged urine.  Hemoglobin stable. Called and gave daughter update this morning as well.  Assessment and Plan:  UTI associated with suprapubic catheter Sepsis - POA Neurogenic bladder s/p suprapubic catheter  BPH Reported fever at home Noted to have tachycardia, tachypnea, lethargy, UTI, and malodorous suprapubic catheter insertion site Blood cultures no growth so far.  Urine culture showed multiple species present Completed 7-day course of IV Rocephin.   Urethral  bleeding Started on 7/25.  No trauma. Per urology note, bleeding is likely from prostatic urethra where he has an area of necrotic tissue post resection.  A large bore ureteral catheter was placed in order to tamponade the bleeding with outside of intervention. Xarelto has been stopped for now - s/p IR embolization of bilateral prostate artery and right inferior rectal artery on 01/16/2023 - Urine in Foley bag little darker today but no overt hematuria - foley removed from penis today per urology; SP cath remains - resuming xarelto today and monitor overnight  Acute on chronic diastolic CHF Essential hypertension Daughter reports shortness of breath on exertion worsening lately. Chest x-ray on admission showed cardiomegaly and vascular congestion. BNP was elevated to 227 Most recent echo done in December 2023 was showing EF 60 to 65% and diastolic function could not be evaluated.  Patient had acute episode of shortness of breath on 7/29.  Chest x-ray showed cardiomegaly with mild, diffuse bilateral interstitial pulm opacities.   - Continue lasix   Paroxysmal A-fib Continue Cardizem.  Metoprolol as needed Xarelto on hold due to urethral bleeding.    Hyperlipidemia Continue Lipitor   Mild hyponatremia Continue gentle IV fluid hydration with normal saline  Mild hypokalemia Replete as needed  Anxiety/depression Not on meds   Diarrhea/constipation Diarrhea improved after he was started on Banatrol and Imodium as needed.  Imodium has been stopped.  Okay to continue Banatrol   Impaired mobility PT eval obtained.  Home Health PT versus SNF.     Old records reviewed in assessment of this patient  Antimicrobials: Rocephin 01/08/2023 >> 01/16/2023  DVT prophylaxis:  SCDs Start: 01/08/23 0506 rivaroxaban (XARELTO) tablet 20 mg   Code Status:   Code Status: Full Code  Mobility Assessment (Last 72 Hours)     Mobility Assessment     Row Name 01/18/23 0912 01/17/23 2001 01/17/23  1514 01/17/23 0917 01/16/23 2244   Does patient have an order for bedrest or is patient medically unstable No - Continue assessment No - Continue assessment -- No - Continue assessment No - Continue assessment   What is the highest level of mobility based on the progressive mobility assessment? Level 5 (Walks with assist in room/hall) - Balance while stepping forward/back and can walk in room with assist - Complete Level 5 (Walks with assist in room/hall) - Balance while stepping forward/back and can walk in room with assist - Complete Level 5 (Walks with assist in room/hall) - Balance while stepping forward/back and can walk in room with assist - Complete Level 5 (Walks with assist in room/hall) - Balance while stepping forward/back and can walk in room with assist - Complete Level 5 (Walks with assist in room/hall) - Balance while stepping forward/back and can walk in room with assist - Complete   Is the above level different from baseline mobility prior to current illness? Yes - Recommend PT order Yes - Recommend PT order -- Yes - Recommend PT order Yes - Recommend PT order    Row Name 01/16/23 1812 01/15/23 2050 01/15/23 1814       Does patient have an order for bedrest or is patient medically unstable No - Continue assessment No - Continue assessment No - Continue assessment     What is the highest level of mobility based on the progressive mobility assessment? Level 5 (Walks with assist in room/hall) - Balance while stepping forward/back and can walk in room with assist - Complete Level 5 (Walks with assist in room/hall) - Balance while stepping forward/back and can walk in room with assist - Complete Level 5 (Walks with assist in room/hall) - Balance while stepping forward/back and can walk in room with assist - Complete     Is the above level different from baseline mobility prior to current illness? -- Yes - Recommend PT order Yes - Recommend PT order             Mobility Assessment (Last  72 Hours)     Mobility Assessment     Row Name 01/18/23 0912 01/17/23 2001 01/17/23 1514 01/17/23 0917 01/16/23 2244   Does patient have an order for bedrest or is patient medically unstable No - Continue assessment No - Continue assessment -- No - Continue assessment No - Continue assessment   What is the highest level of mobility based on the progressive mobility assessment? Level 5 (Walks with assist in room/hall) - Balance while stepping forward/back and can walk in room with assist - Complete Level 5 (Walks with assist in room/hall) - Balance while stepping forward/back and can walk in room with assist - Complete Level 5 (Walks with assist in room/hall) - Balance while stepping forward/back and can walk in room with assist - Complete Level 5 (Walks with assist in room/hall) - Balance while stepping forward/back and can walk in room with assist - Complete Level 5 (Walks with assist in room/hall) - Balance while stepping forward/back and can walk in room with assist - Complete   Is the above level different from baseline mobility prior to current illness? Yes - Recommend PT  order Yes - Recommend PT order -- Yes - Recommend PT order Yes - Recommend PT order    Row Name 01/16/23 1812 01/15/23 2050 01/15/23 1814       Does patient have an order for bedrest or is patient medically unstable No - Continue assessment No - Continue assessment No - Continue assessment     What is the highest level of mobility based on the progressive mobility assessment? Level 5 (Walks with assist in room/hall) - Balance while stepping forward/back and can walk in room with assist - Complete Level 5 (Walks with assist in room/hall) - Balance while stepping forward/back and can walk in room with assist - Complete Level 5 (Walks with assist in room/hall) - Balance while stepping forward/back and can walk in room with assist - Complete     Is the above level different from baseline mobility prior to current illness? -- Yes -  Recommend PT order Yes - Recommend PT order             Mobility Assessment (Last 72 Hours)     Mobility Assessment     Row Name 01/18/23 0912 01/17/23 2001 01/17/23 1514 01/17/23 0917 01/16/23 2244   Does patient have an order for bedrest or is patient medically unstable No - Continue assessment No - Continue assessment -- No - Continue assessment No - Continue assessment   What is the highest level of mobility based on the progressive mobility assessment? Level 5 (Walks with assist in room/hall) - Balance while stepping forward/back and can walk in room with assist - Complete Level 5 (Walks with assist in room/hall) - Balance while stepping forward/back and can walk in room with assist - Complete Level 5 (Walks with assist in room/hall) - Balance while stepping forward/back and can walk in room with assist - Complete Level 5 (Walks with assist in room/hall) - Balance while stepping forward/back and can walk in room with assist - Complete Level 5 (Walks with assist in room/hall) - Balance while stepping forward/back and can walk in room with assist - Complete   Is the above level different from baseline mobility prior to current illness? Yes - Recommend PT order Yes - Recommend PT order -- Yes - Recommend PT order Yes - Recommend PT order    Row Name 01/16/23 1812 01/15/23 2050 01/15/23 1814       Does patient have an order for bedrest or is patient medically unstable No - Continue assessment No - Continue assessment No - Continue assessment     What is the highest level of mobility based on the progressive mobility assessment? Level 5 (Walks with assist in room/hall) - Balance while stepping forward/back and can walk in room with assist - Complete Level 5 (Walks with assist in room/hall) - Balance while stepping forward/back and can walk in room with assist - Complete Level 5 (Walks with assist in room/hall) - Balance while stepping forward/back and can walk in room with assist - Complete      Is the above level different from baseline mobility prior to current illness? -- Yes - Recommend PT order Yes - Recommend PT order              Barriers to discharge:  Disposition Plan:  Home with HH Status is: Inpt  Objective: Blood pressure 139/88, pulse 84, temperature 98 F (36.7 C), resp. rate 18, height 6' (1.829 m), weight 114.3 kg, SpO2 97%.  Examination:  Physical Exam Constitutional:  General: He is not in acute distress.    Appearance: Normal appearance.  HENT:     Head: Normocephalic and atraumatic.     Mouth/Throat:     Mouth: Mucous membranes are moist.  Eyes:     Extraocular Movements: Extraocular movements intact.  Cardiovascular:     Rate and Rhythm: Normal rate and regular rhythm.  Pulmonary:     Effort: Pulmonary effort is normal. No respiratory distress.     Breath sounds: Normal breath sounds. No wheezing.  Abdominal:     General: Bowel sounds are normal. There is no distension.     Palpations: Abdomen is soft.     Tenderness: There is no abdominal tenderness.  Genitourinary:    Comments: SP cath in place and indwelling foley in place  Musculoskeletal:        General: Normal range of motion.     Cervical back: Normal range of motion and neck supple.  Skin:    General: Skin is warm and dry.  Neurological:     General: No focal deficit present.     Mental Status: He is alert.  Psychiatric:        Mood and Affect: Mood normal.        Behavior: Behavior normal.      Consultants:  Urology IR  Procedures:  01/16/2023: Bilateral prostate artery embolization; Coil embolization of right inferior rectal artery  Data Reviewed: Results for orders placed or performed during the hospital encounter of 01/07/23 (from the past 24 hour(s))  CBC with Differential/Platelet     Status: Abnormal   Collection Time: 01/18/23  4:02 AM  Result Value Ref Range   WBC 13.0 (H) 4.0 - 10.5 K/uL   RBC 3.69 (L) 4.22 - 5.81 MIL/uL   Hemoglobin 12.4 (L) 13.0 -  17.0 g/dL   HCT 16.1 (L) 09.6 - 04.5 %   MCV 99.5 80.0 - 100.0 fL   MCH 33.6 26.0 - 34.0 pg   MCHC 33.8 30.0 - 36.0 g/dL   RDW 40.9 81.1 - 91.4 %   Platelets 453 (H) 150 - 400 K/uL   nRBC 0.0 0.0 - 0.2 %   Neutrophils Relative % 68 %   Neutro Abs 8.9 (H) 1.7 - 7.7 K/uL   Lymphocytes Relative 19 %   Lymphs Abs 2.4 0.7 - 4.0 K/uL   Monocytes Relative 8 %   Monocytes Absolute 1.0 0.1 - 1.0 K/uL   Eosinophils Relative 2 %   Eosinophils Absolute 0.3 0.0 - 0.5 K/uL   Basophils Relative 1 %   Basophils Absolute 0.1 0.0 - 0.1 K/uL   Immature Granulocytes 2 %   Abs Immature Granulocytes 0.21 (H) 0.00 - 0.07 K/uL    I have reviewed pertinent nursing notes, vitals, labs, and images as necessary. I have ordered labwork to follow up on as indicated.  I have reviewed the last notes from staff over past 24 hours. I have discussed patient's care plan and test results with nursing staff, CM/SW, and other staff as appropriate.  Time spent: Greater than 50% of the 55 minute visit was spent in counseling/coordination of care for the patient as laid out in the A&P.   LOS: 10 days   Lewie Chamber, MD [facility] 01/18/2023, 5:21 PM

## 2023-01-18 NOTE — Progress Notes (Signed)
Mobility Specialist - Progress Note   01/18/23 1112  Mobility  Activity Ambulated with assistance in hallway  Level of Assistance Moderate assist, patient does 50-74%  Assistive Device Front wheel walker  Distance Ambulated (ft) 70 ft  Range of Motion/Exercises Active Assistive  Activity Response Tolerated well  Mobility Referral Yes  $Mobility charge 1 Mobility  Mobility Specialist Start Time (ACUTE ONLY) 1045  Mobility Specialist Stop Time (ACUTE ONLY) 1112  Mobility Specialist Time Calculation (min) (ACUTE ONLY) 27 min   Pt was found in bed and agreeable to ambulate. Was mod-A for bed mobility and was a little unsteady sitting EOB. Had no complaints with ambulation. At EOS returned to recliner chair with all needs met. Call bell in reach and chair alarm on.  Billey Chang Mobility Specialist

## 2023-01-19 DIAGNOSIS — L089 Local infection of the skin and subcutaneous tissue, unspecified: Secondary | ICD-10-CM | POA: Diagnosis not present

## 2023-01-19 DIAGNOSIS — E876 Hypokalemia: Secondary | ICD-10-CM | POA: Diagnosis not present

## 2023-01-19 DIAGNOSIS — R296 Repeated falls: Secondary | ICD-10-CM | POA: Diagnosis not present

## 2023-01-19 DIAGNOSIS — F419 Anxiety disorder, unspecified: Secondary | ICD-10-CM | POA: Diagnosis not present

## 2023-01-19 DIAGNOSIS — E8721 Acute metabolic acidosis: Secondary | ICD-10-CM | POA: Diagnosis not present

## 2023-01-19 DIAGNOSIS — R319 Hematuria, unspecified: Secondary | ICD-10-CM | POA: Diagnosis not present

## 2023-01-19 DIAGNOSIS — G4700 Insomnia, unspecified: Secondary | ICD-10-CM | POA: Diagnosis not present

## 2023-01-19 DIAGNOSIS — R29898 Other symptoms and signs involving the musculoskeletal system: Secondary | ICD-10-CM | POA: Diagnosis not present

## 2023-01-19 DIAGNOSIS — I10 Essential (primary) hypertension: Secondary | ICD-10-CM | POA: Diagnosis not present

## 2023-01-19 DIAGNOSIS — M62838 Other muscle spasm: Secondary | ICD-10-CM | POA: Diagnosis not present

## 2023-01-19 DIAGNOSIS — I5032 Chronic diastolic (congestive) heart failure: Secondary | ICD-10-CM | POA: Diagnosis not present

## 2023-01-19 DIAGNOSIS — N390 Urinary tract infection, site not specified: Secondary | ICD-10-CM | POA: Diagnosis not present

## 2023-01-19 DIAGNOSIS — N319 Neuromuscular dysfunction of bladder, unspecified: Secondary | ICD-10-CM | POA: Diagnosis not present

## 2023-01-19 DIAGNOSIS — I5033 Acute on chronic diastolic (congestive) heart failure: Secondary | ICD-10-CM | POA: Diagnosis not present

## 2023-01-19 DIAGNOSIS — S82832S Other fracture of upper and lower end of left fibula, sequela: Secondary | ICD-10-CM | POA: Diagnosis not present

## 2023-01-19 DIAGNOSIS — I480 Paroxysmal atrial fibrillation: Secondary | ICD-10-CM | POA: Diagnosis not present

## 2023-01-19 DIAGNOSIS — I4891 Unspecified atrial fibrillation: Secondary | ICD-10-CM | POA: Diagnosis not present

## 2023-01-19 DIAGNOSIS — E871 Hypo-osmolality and hyponatremia: Secondary | ICD-10-CM | POA: Diagnosis not present

## 2023-01-19 DIAGNOSIS — Z8673 Personal history of transient ischemic attack (TIA), and cerebral infarction without residual deficits: Secondary | ICD-10-CM | POA: Diagnosis not present

## 2023-01-19 DIAGNOSIS — F1020 Alcohol dependence, uncomplicated: Secondary | ICD-10-CM | POA: Diagnosis not present

## 2023-01-19 DIAGNOSIS — M84372D Stress fracture, left ankle, subsequent encounter for fracture with routine healing: Secondary | ICD-10-CM | POA: Diagnosis not present

## 2023-01-19 DIAGNOSIS — N312 Flaccid neuropathic bladder, not elsewhere classified: Secondary | ICD-10-CM | POA: Diagnosis not present

## 2023-01-19 DIAGNOSIS — I498 Other specified cardiac arrhythmias: Secondary | ICD-10-CM | POA: Diagnosis not present

## 2023-01-19 DIAGNOSIS — T83511A Infection and inflammatory reaction due to indwelling urethral catheter, initial encounter: Secondary | ICD-10-CM | POA: Diagnosis not present

## 2023-01-19 DIAGNOSIS — R278 Other lack of coordination: Secondary | ICD-10-CM | POA: Diagnosis not present

## 2023-01-19 DIAGNOSIS — F411 Generalized anxiety disorder: Secondary | ICD-10-CM | POA: Diagnosis not present

## 2023-01-19 DIAGNOSIS — D6869 Other thrombophilia: Secondary | ICD-10-CM | POA: Diagnosis not present

## 2023-01-19 DIAGNOSIS — N368 Other specified disorders of urethra: Secondary | ICD-10-CM | POA: Diagnosis not present

## 2023-01-19 DIAGNOSIS — R2681 Unsteadiness on feet: Secondary | ICD-10-CM | POA: Diagnosis not present

## 2023-01-19 DIAGNOSIS — Z7401 Bed confinement status: Secondary | ICD-10-CM | POA: Diagnosis not present

## 2023-01-19 DIAGNOSIS — F331 Major depressive disorder, recurrent, moderate: Secondary | ICD-10-CM | POA: Diagnosis not present

## 2023-01-19 DIAGNOSIS — R41841 Cognitive communication deficit: Secondary | ICD-10-CM | POA: Diagnosis not present

## 2023-01-19 DIAGNOSIS — R279 Unspecified lack of coordination: Secondary | ICD-10-CM | POA: Diagnosis not present

## 2023-01-19 DIAGNOSIS — Z9181 History of falling: Secondary | ICD-10-CM | POA: Diagnosis not present

## 2023-01-19 DIAGNOSIS — R531 Weakness: Secondary | ICD-10-CM | POA: Diagnosis not present

## 2023-01-19 DIAGNOSIS — F1010 Alcohol abuse, uncomplicated: Secondary | ICD-10-CM | POA: Diagnosis not present

## 2023-01-19 DIAGNOSIS — A419 Sepsis, unspecified organism: Secondary | ICD-10-CM | POA: Diagnosis not present

## 2023-01-19 DIAGNOSIS — K5900 Constipation, unspecified: Secondary | ICD-10-CM | POA: Diagnosis not present

## 2023-01-19 DIAGNOSIS — R5381 Other malaise: Secondary | ICD-10-CM | POA: Diagnosis not present

## 2023-01-19 DIAGNOSIS — M6259 Muscle wasting and atrophy, not elsewhere classified, multiple sites: Secondary | ICD-10-CM | POA: Diagnosis not present

## 2023-01-19 DIAGNOSIS — E785 Hyperlipidemia, unspecified: Secondary | ICD-10-CM | POA: Diagnosis not present

## 2023-01-19 DIAGNOSIS — N320 Bladder-neck obstruction: Secondary | ICD-10-CM | POA: Diagnosis not present

## 2023-01-19 DIAGNOSIS — F418 Other specified anxiety disorders: Secondary | ICD-10-CM | POA: Diagnosis not present

## 2023-01-19 DIAGNOSIS — W19XXXD Unspecified fall, subsequent encounter: Secondary | ICD-10-CM | POA: Diagnosis not present

## 2023-01-19 DIAGNOSIS — E538 Deficiency of other specified B group vitamins: Secondary | ICD-10-CM | POA: Diagnosis not present

## 2023-01-19 DIAGNOSIS — M6281 Muscle weakness (generalized): Secondary | ICD-10-CM | POA: Diagnosis not present

## 2023-01-19 DIAGNOSIS — Z131 Encounter for screening for diabetes mellitus: Secondary | ICD-10-CM | POA: Diagnosis not present

## 2023-01-19 DIAGNOSIS — F32A Depression, unspecified: Secondary | ICD-10-CM | POA: Diagnosis not present

## 2023-01-19 DIAGNOSIS — R262 Difficulty in walking, not elsewhere classified: Secondary | ICD-10-CM | POA: Diagnosis not present

## 2023-01-19 DIAGNOSIS — Z7901 Long term (current) use of anticoagulants: Secondary | ICD-10-CM | POA: Diagnosis not present

## 2023-01-19 DIAGNOSIS — Z7189 Other specified counseling: Secondary | ICD-10-CM | POA: Diagnosis not present

## 2023-01-19 DIAGNOSIS — R310 Gross hematuria: Secondary | ICD-10-CM | POA: Diagnosis not present

## 2023-01-19 DIAGNOSIS — I639 Cerebral infarction, unspecified: Secondary | ICD-10-CM | POA: Diagnosis not present

## 2023-01-19 DIAGNOSIS — E119 Type 2 diabetes mellitus without complications: Secondary | ICD-10-CM | POA: Diagnosis not present

## 2023-01-19 DIAGNOSIS — I482 Chronic atrial fibrillation, unspecified: Secondary | ICD-10-CM | POA: Diagnosis not present

## 2023-01-19 DIAGNOSIS — N400 Enlarged prostate without lower urinary tract symptoms: Secondary | ICD-10-CM | POA: Diagnosis not present

## 2023-01-19 DIAGNOSIS — Z9359 Other cystostomy status: Secondary | ICD-10-CM | POA: Diagnosis not present

## 2023-01-19 DIAGNOSIS — R9431 Abnormal electrocardiogram [ECG] [EKG]: Secondary | ICD-10-CM | POA: Diagnosis not present

## 2023-01-19 MED ORDER — GUAIFENESIN-DM 100-10 MG/5ML PO SYRP
5.0000 mL | ORAL_SOLUTION | ORAL | Status: DC | PRN
  Administered 2023-01-19: 5 mL via ORAL
  Filled 2023-01-19: qty 10

## 2023-01-19 NOTE — TOC Transition Note (Addendum)
Transition of Care [facility]) - CM/SW Discharge Note   Patient Details  Name: Calvin [name] [name]: [id number] Date of Birth: 06-17-1946  Transition of [facility] CM/SW Contact:  Georgie Chard, LCSW Phone Number: 01/19/2023, 10:51 AM   Clinical Narrative:    This CSW reached out to the patient's daughter to inform her that the patient's AUTH was approved and will DC to [facility] # [id number] report # [phone]. Patient nurse was made aware at this time [facility] will help with transport.    Final next level of care: Skilled Nursing Facility Barriers to Discharge: No Barriers Identified   Patient Goals and CMS Choice CMS Medicare.gov Compare Post Acute Care list provided to:: Patient Choice offered to / list presented to : Patient  Discharge Placement                Patient chooses bed at:  [facility] [place]) Patient to be transferred to facility by: [facility] Name of family member notified: Daughter Lonie Peak Patient and family notified of of transfer: 01/19/23  Discharge Plan and Services Additional resources added to the After Visit Summary for   In-house Referral: Clinical Social Work                                   Social Determinants of Health (SDOH) Interventions SDOH Screenings   Food Insecurity: No Food Insecurity (01/08/2023)  Housing: Low Risk  (01/08/2023)  Transportation Needs: No Transportation Needs (01/08/2023)  Utilities: Not At Risk (01/08/2023)  Alcohol Screen: Medium Risk (04/14/2020)  Depression (PHQ2-9): Low Risk  (06/28/2022)  Financial Resource Strain: Low Risk  (04/14/2020)  Physical Activity: Inactive (03/08/2021)  Social Connections: Socially Isolated (03/08/2021)  Stress: No Stress Concern Present (04/14/2020)  Tobacco Use: Low Risk  (01/07/2023)     Readmission Risk Interventions    01/06/2023   12:32 PM 10/29/2022   12:11 PM 03/19/2022    2:17 PM  Readmission Risk Prevention Plan  Post Dischage Appt   Complete  Medication Screening    Complete  Transportation Screening Complete Complete Complete  PCP or Specialist Appt within 5-7 Days Complete    PCP or Specialist Appt within 3-5 Days  Complete   Home Care Screening Complete    Medication Review (RN CM) Complete    HRI or Home Care Consult  Complete   Social Work Consult for Recovery Care Planning/Counseling  Complete   Palliative Care Screening  Not Applicable   Medication Review Oceanographer)  Complete

## 2023-01-19 NOTE — Plan of Care (Signed)

## 2023-01-19 NOTE — Discharge Summary (Signed)
Physician Discharge Summary   ADDIEL MCCARDLE GNF:[id number] DOB: [birth year] DOA: 01/07/2023  PCP: [name], MD  Admit date: 01/07/2023 Discharge date:  01/19/2023  Admitted From: Home Disposition:  Home Discharging physician: Lewie Chamber, MD Barriers to discharge:   Recommendations at discharge: Followup with urology    Discharge Condition: stable CODE STATUS: Full Diet recommendation:  Diet Orders (From admission, onward)     Start     Ordered   01/19/23 0000  Diet general        01/19/23 1028   01/16/23 1558  Diet regular Room service appropriate? Yes; Fluid consistency: Thin  Diet effective now       Question Answer Comment  Room service appropriate? Yes   Fluid consistency: Thin      01/16/23 1557            Hospital Course: Mr. Wissmann is a 77 yo male with PMH ischemic stroke 05/2022, BPH, neurogenic bladder now with suprapubic cath in place since 12/25/22 and HTN.  Recently admitted 7/20-7/21 due to obstruction of his suprapubic catheter for which he was evaluated by urology and found to have encrusted suprapubic tube which was exchanged.  He was also found to have new onset A-fib with RVR and was discharged on oral Cardizem and Xarelto. Next day, 7/22, patient was brought to the ED with a fever 100.9, lethargy and redness and malodor noted at the site of suprapubic catheter insertion.   In the ED, he was afebrile but tachycardic, tachypneic, he was noted to have a low O2 sat of 89% on room air, placed on 2 L oxygen nasal cannula. Labs showed WBC 13.1, sodium 130, lactate normal x 2. UA with positive nitrite, moderate leukocytes, and microscopy showing 21-50 RBCs, 21-50 WBCs, and many bacteria.  Urine culture grew multiple species and he was continued on an empiric course.   He also developed urethral bleeding and underwent evaluation with IR and underwent bilateral prostate artery embolization and coil embolization of right inferior rectal artery on  01/16/2023.  Assessment and Plan:  UTI associated with suprapubic catheter Sepsis - POA Neurogenic bladder s/p suprapubic catheter  BPH Reported fever at home Noted to have tachycardia, tachypnea, lethargy, UTI, and malodorous suprapubic catheter insertion site Blood cultures no growth so far.  Urine culture showed multiple species present Completed 7-day course of IV Rocephin.   Urethral bleeding - resolved  Started on 7/25.  No trauma. Per urology note, bleeding is likely from prostatic urethra where he has an area of necrotic tissue post resection.  A large bore ureteral catheter was placed in order to tamponade the bleeding with outside of intervention. Xarelto has been stopped for now - s/p IR embolization of bilateral prostate artery and right inferior rectal artery on 01/16/2023 - Urine in Foley bag little darker today but no overt hematuria - foley removed from penis 8/2 and no further bleeding. Suprapubic cath remains and is draining clear yellow urine - xarelto resumed 8/2 and Hgb stable, again no further bleeding noted    Acute on chronic diastolic CHF Essential hypertension Daughter reports shortness of breath on exertion worsening lately. Chest x-ray on admission showed cardiomegaly and vascular congestion. BNP was elevated to 227 Most recent echo done in December 2023 was showing EF 60 to 65% and diastolic function could not be evaluated.  Patient had acute episode of shortness of breath on 7/29.  Chest x-ray showed cardiomegaly with mild, diffuse bilateral interstitial pulm opacities.   - Continue  lasix    Paroxysmal A-fib Continue Cardizem.  - xarelto resumed 8/2   Hyperlipidemia Continue Lipitor   Mild hyponatremia - s/p IVF   Mild hypokalemia Repleted   Anxiety/depression Not on meds   Diarrhea/constipation - resolved   Impaired mobility PT eval obtained.  Plan is SNF at discharge   Principal Diagnosis: Catheter-associated urinary tract infection  [facility])  Discharge Diagnoses: Active Hospital Problems   Diagnosis Date Noted   Catheter-associated urinary tract infection (HCC) 01/08/2023   Urethral bleeding 01/10/2023   SIRS (systemic inflammatory response syndrome) (HCC) 01/08/2023   Hypoxia 01/08/2023   Hyponatremia 01/08/2023   Hypokalemia 01/08/2023   Elevated brain natriuretic peptide (BNP) level 01/08/2023   A-fib (HCC) 01/06/2023   Mixed hyperlipidemia 10/28/2022   Essential hypertension 07/07/2007    Resolved Hospital Problems  No resolved problems to display.     Discharge Instructions     Diet general   Complete by: As directed    Increase activity slowly   Complete by: As directed    No wound care   Complete by: As directed       Allergies as of 01/19/2023   No Known Allergies      Medication List     TAKE these medications    atorvastatin 20 MG tablet Commonly known as: LIPITOR Take 20 mg by mouth daily.   BD Eclipse Syringe/Needle 25G X 5/8" 3 ML Misc Generic drug: SYRINGE-NEEDLE (DISP) 3 ML Use as directed once a week   cyanocobalamin 1000 MCG/ML injection Commonly known as: VITAMIN B12 INJECT 1 ML (1,000 MCG TOTAL) INTO THE MUSCLE ONCE A WEEK.   cyclobenzaprine 10 MG tablet Commonly known as: FLEXERIL Take 1 tablet (10 mg total) by mouth 3 (three) times daily as needed for muscle spasms.   diltiazem 180 MG 24 hr capsule Commonly known as: CARDIZEM CD Take 1 capsule (180 mg total) by mouth daily.   docusate sodium 100 MG capsule Commonly known as: COLACE Take 100 mg by mouth 2 (two) times daily.   finasteride 5 MG tablet Commonly known as: PROSCAR TAKE 1 TABLET (5 MG TOTAL) BY MOUTH DAILY.   fluticasone 50 MCG/ACT nasal spray Commonly known as: FLONASE Place 1 spray into both nostrils daily.   folic acid 1 MG tablet Commonly known as: FOLVITE Take 1 tablet (1 mg total) by mouth daily.   furosemide 20 MG tablet Commonly known as: LASIX Take 1 tablet (20 mg total) by mouth  daily.   Klor-Con M10 10 MEQ tablet Generic drug: potassium chloride Take 1 tablet (10 mEq total) by mouth 2 (two) times daily.   loperamide 2 MG capsule Commonly known as: IMODIUM Take 1 capsule (2 mg total) by mouth every 8 (eight) hours as needed for diarrhea or loose stools.   multivitamin with minerals Tabs tablet Take 1 tablet by mouth daily.   ondansetron 4 MG tablet Commonly known as: ZOFRAN Take 1 tablet (4 mg total) by mouth every 8 (eight) hours as needed for nausea or vomiting.   rivaroxaban 20 MG Tabs tablet Commonly known as: XARELTO Take 1 tablet (20 mg total) by mouth daily.   sertraline 100 MG tablet Commonly known as: ZOLOFT TAKE 1 TABLET BY MOUTH EVERY DAY   tamsulosin 0.4 MG Caps capsule Commonly known as: FLOMAX TAKE 1 CAPSULE BY MOUTH EVERY DAY   thiamine 100 MG tablet Commonly known as: Vitamin B-1 Take 1 tablet (100 mg total) by mouth daily.  Contact information for after-discharge care     Destination     [facility] .   Service: Skilled Nursing Contact information: [address] [place] [address] [phone]                    No Known Allergies  Consultations: Urology IR  Procedures: 01/16/2023: Bilateral prostate artery embolization; Coil embolization of right inferior rectal artery   Discharge Exam: BP (!) 148/91 (BP Location: Right Arm)   Pulse 86   Temp 98.6 F (37 C) (Oral)   Resp 18   Ht 6' (1.829 m)   Wt 114.3 kg   SpO2 96%   BMI 34.18 kg/m  Physical Exam Constitutional:      General: He is not in acute distress.    Appearance: Normal appearance.  HENT:     Head: Normocephalic and atraumatic.     Mouth/Throat:     Mouth: Mucous membranes are moist.  Eyes:     Extraocular Movements: Extraocular movements intact.  Cardiovascular:     Rate and Rhythm: Normal rate and regular rhythm.  Pulmonary:     Effort: Pulmonary effort is normal. No respiratory distress.      Breath sounds: Normal breath sounds. No wheezing.  Abdominal:     General: Bowel sounds are normal. There is no distension.     Palpations: Abdomen is soft.     Tenderness: There is no abdominal tenderness.  Genitourinary:    Comments: SP cath in place and indwelling foley in place  Musculoskeletal:        General: Normal range of motion.     Cervical back: Normal range of motion and neck supple.  Skin:    General: Skin is warm and dry.  Neurological:     General: No focal deficit present.     Mental Status: He is alert.  Psychiatric:        Mood and Affect: Mood normal.        Behavior: Behavior normal.      The results of significant diagnostics from this hospitalization (including imaging, microbiology, ancillary and laboratory) are listed below for reference.   Microbiology: No results found for this or any previous visit (from the past 240 hour(s)).   Labs: BNP (last 3 results) Recent Labs    08/09/22 1558 01/05/23 1309 01/07/23 1834  BNP 94.6 81.3 226.8*   Basic Metabolic Panel: Recent Labs  Lab 01/13/23 0429 01/14/23 0411 01/15/23 0407 01/16/23 0429 01/17/23 0407  NA 130* 134* 136 135 133*  K 3.9 3.7 3.6 3.6 3.6  CL 100 101 100 98 99  CO2 18* 23 24 25 24   GLUCOSE 110* 125* 120* 116* 122*  BUN 15 12 15 20 22   CREATININE 0.71 0.75 0.88 0.90 0.93  CALCIUM 8.4* 8.9 9.2 9.1 9.3   Liver Function Tests: No results for input(s): "AST", "ALT", "ALKPHOS", "BILITOT", "PROT", "ALBUMIN" in the last 168 hours. No results for input(s): "LIPASE", "AMYLASE" in the last 168 hours. No results for input(s): "AMMONIA" in the last 168 hours. CBC: Recent Labs  Lab 01/15/23 0407 01/16/23 0429 01/17/23 0407 01/18/23 0402 01/19/23 0528  WBC 10.7* 10.7* 14.2* 13.0* 10.5  NEUTROABS 7.3 6.5 10.4* 8.9* 7.1  HGB 11.6* 11.7* 12.5* 12.4* 11.9*  HCT 34.5* 34.5* 36.8* 36.7* 35.2*  MCV 98.3 97.2 98.4 99.5 99.2  PLT 406* 432* 488* 453* 420*   Cardiac Enzymes: No results for  input(s): "CKTOTAL", "CKMB", "CKMBINDEX", "TROPONINI" in the last  168 hours. BNP: Invalid input(s): "POCBNP" CBG: No results for input(s): "GLUCAP" in the last 168 hours. D-Dimer No results for input(s): "DDIMER" in the last 72 hours. Hgb A1c No results for input(s): "HGBA1C" in the last 72 hours. Lipid Profile No results for input(s): "CHOL", "HDL", "LDLCALC", "TRIG", "CHOLHDL", "LDLDIRECT" in the last 72 hours. Thyroid function studies No results for input(s): "TSH", "T4TOTAL", "T3FREE", "THYROIDAB" in the last 72 hours.  Invalid input(s): "FREET3" Anemia work up No results for input(s): "VITAMINB12", "FOLATE", "FERRITIN", "TIBC", "IRON", "RETICCTPCT" in the last 72 hours. Urinalysis    Component Value Date/Time   COLORURINE YELLOW 01/07/2023 2232   APPEARANCEUR HAZY (A) 01/07/2023 2232   LABSPEC 1.014 01/07/2023 2232   PHURINE 7.0 01/07/2023 2232   GLUCOSEU NEGATIVE 01/07/2023 2232   HGBUR MODERATE (A) 01/07/2023 2232   HGBUR negative 12/14/2009 0823   BILIRUBINUR NEGATIVE 01/07/2023 2232   BILIRUBINUR neg 09/14/2020 1459   KETONESUR NEGATIVE 01/07/2023 2232   PROTEINUR 30 (A) 01/07/2023 2232   UROBILINOGEN 1.0 09/14/2020 1459   UROBILINOGEN 1.0 03/12/2012 1250   NITRITE POSITIVE (A) 01/07/2023 2232   LEUKOCYTESUR MODERATE (A) 01/07/2023 2232   Sepsis Labs Recent Labs  Lab 01/16/23 0429 01/17/23 0407 01/18/23 0402 01/19/23 0528  WBC 10.7* 14.2* 13.0* 10.5   Microbiology No results found for this or any previous visit (from the past 240 hour(s)).  Procedures/Studies: IR EMBO TUMOR ORGAN ISCHEMIA INFARCT INC GUIDE ROADMAPPING  Result Date: 01/17/2023 INDICATION: 77 year old male with history of prostatic hematuria, currently admitted as an inpatient. EXAM: 1. Ultrasound-guided vascular access of the left radial artery. 2. Selective catheterization and angiography of the right internal iliac, right internal pudendal, right inferior rectal, right prostatic, left  common iliac, left internal iliac, left obturator, and left prostatic arteries. 3. Cone beam CT. 4. Coil embolization of right inferior rectal artery. 5. Right prostatic artery embolization. 6. Left prostatic artery embolization. MEDICATIONS: 200 mg ciprofloxacin, intravenous ANESTHESIA/SEDATION: Moderate (conscious) sedation was employed during this procedure. A total of Versed 2.5 mg and Fentanyl 100 mcg was administered intravenously. Moderate Sedation Time: 103 minutes. The patient's level of consciousness and vital signs were monitored continuously by radiology nursing throughout the procedure under my direct supervision. CONTRAST:  20 mL Omnipaque 300 FLUOROSCOPY: Radiation Exposure Index (as provided by the fluoroscopic device): 2,819 mGy Kerma COMPLICATIONS: None immediate. PROCEDURE: Informed consent was obtained from the patient following explanation of the procedure, risks, benefits and alternatives. The patient understands, agrees and consents for the procedure. All questions were addressed. A time out was performed prior to the initiation of the procedure. Maximal barrier sterile technique utilized including caps, mask, sterile gowns, sterile gloves, large sterile drape, hand hygiene, and Betadine prep. The left wrist was prepped and draped in standard fashion. Pulse oximeter was attached to the left thumb. Tora Perches test was performed, grade A. The left radial artery measured 0.4 cm in diameter. Subdermal Local anesthesia was provided at the planned needle entry site with 1% lidocaine. A small skin nick was made. Under direct ultrasound visualization, the left radial artery was punctured with a 21 gauge micropuncture needle. A permanent image was captured and stored in the record. A microwire was placed and exchanged for a 4/5 French slender sheath. The sheath was flushed followed by installation of standard radial cocktail. A 5 French MG2 glide catheter and Bentson wire were directed under fluoroscopic  visualization through the left upper extremity, around the aortic arch, through the descending thoracic aorta and into the abdominal aorta.  The catheter was then directed into the right internal iliac artery. Right internal iliac artery angiogram demonstrated patency of the anterior and posterior divisions with the prostatic artery appearing to arise from the internal pudendal artery. A 1.9 French Progreat lambda microcatheter and standard 0.014 inch air Stahl wire within directed into the right internal pudendal artery. Angiogram was performed which demonstrated patency and origination of the right prostatic artery. The right prostatic artery was then selected and right prostatic artery angiogram was performed. Cone beam CT was then performed which demonstrated a proximal inferior rectal branch about the prostatic artery. There are no additional evidence of nontarget branch vessels. The inferior rectal artery was then selected and angiogram was performed which demonstrated no evidence of nontarget branch vessels. Next, proximal embolization of the right inferior rectal artery was performed with a single 1 mm x 5 mm low profile Ruby detachable coil. The catheter was retracted into the parent vessel and injection of contrast demonstrated adequate embolization and preserved patency of the right prostatic artery. The microcatheter was then directed deeper into the distal prostatic artery and particle embolization with dilute 400 micron hydro pleural see years was performed until stasis was achieved. The catheter was then retracted into the proximal prostatic artery and completion arteriogram demonstrated adequate embolization of the prostatic parenchyma. The microcatheter was then removed. The 5 French base catheter was then directed under fluoroscopic guidance to the left internal iliac artery. Angiogram was performed which demonstrated patency of the posterior and anterior divisions with the prostatic artery arising  from the mid obturator artery. The obturator artery was then selected with the straight lambda Progreat microcatheter and Aristotle 14 microwire. Angiogram was performed in different obliquities to display the origin of the prostatic artery. There were several unsuccessful attempts with cannulating the prostatic artery including use of a fathom 14 microwire. Therefore, the catheter was exchanged for a triple angle lambda microcatheter which was then able to successfully select the prostatic artery origin with wireless technique. Angiogram was performed of the prostatic artery which demonstrated opacification of the prostate and no evidence of nontarget branch vessels. There was some spasm within the prostatic artery, therefore 200 mcg of nitroglycerin was administered intra-arterially. Next, particle embolization of the left hemi prostate was performed with 400 micron hydro pearls. Due to the spasm visualized on prior arteriogram, the proximal prostatic artery was embolized with several low-profile Ruby detachable microcoils to ensure complete embolization. The catheter was retracted into the obturator artery and completion angiogram was performed which demonstrated appropriate and complete embolization of the left prostatic artery. The catheters were removed. A TR band was placed about the left wrist and inflated than the indwelling slender sheath was removed successfully. The patient tolerated the procedure well was transferred back to the floor in good condition. IMPRESSION: 1. Technically successful particle embolization of the right prostatic artery. 2. Shared origin of the right prostatic artery and right inferior rectal artery necessitating coil embolization of the right inferior rectal artery for embolic protection purposes. 3. Technically successful particle and coil embolization of the left prostatic artery. Marliss Coots, MD [facility] Radiology  Electronically Signed   By: Marliss Coots M.D.   On: 01/17/2023 07:28   IR US Guide Vasc Access Left  Result Date: 01/17/2023 INDICATION: 77 year old male with history of prostatic hematuria, currently admitted as an inpatient. EXAM: 1. Ultrasound-guided vascular access of the left radial artery. 2. Selective catheterization and angiography of the right internal iliac, right internal pudendal,  right inferior rectal, right prostatic, left common iliac, left internal iliac, left obturator, and left prostatic arteries. 3. Cone beam CT. 4. Coil embolization of right inferior rectal artery. 5. Right prostatic artery embolization. 6. Left prostatic artery embolization. MEDICATIONS: 200 mg ciprofloxacin, intravenous ANESTHESIA/SEDATION: Moderate (conscious) sedation was employed during this procedure. A total of Versed 2.5 mg and Fentanyl 100 mcg was administered intravenously. Moderate Sedation Time: 103 minutes. The patient's level of consciousness and vital signs were monitored continuously by radiology nursing throughout the procedure under my direct supervision. CONTRAST:  20 mL Omnipaque 300 FLUOROSCOPY: Radiation Exposure Index (as provided by the fluoroscopic device): 2,819 mGy Kerma COMPLICATIONS: None immediate. PROCEDURE: Informed consent was obtained from the patient following explanation of the procedure, risks, benefits and alternatives. The patient understands, agrees and consents for the procedure. All questions were addressed. A time out was performed prior to the initiation of the procedure. Maximal barrier sterile technique utilized including caps, mask, sterile gowns, sterile gloves, large sterile drape, hand hygiene, and Betadine prep. The left wrist was prepped and draped in standard fashion. Pulse oximeter was attached to the left thumb. Tora Perches test was performed, grade A. The left radial artery measured 0.4 cm in diameter. Subdermal Local anesthesia was provided at the planned needle entry site  with 1% lidocaine. A small skin nick was made. Under direct ultrasound visualization, the left radial artery was punctured with a 21 gauge micropuncture needle. A permanent image was captured and stored in the record. A microwire was placed and exchanged for a 4/5 French slender sheath. The sheath was flushed followed by installation of standard radial cocktail. A 5 French MG2 glide catheter and Bentson wire were directed under fluoroscopic visualization through the left upper extremity, around the aortic arch, through the descending thoracic aorta and into the abdominal aorta. The catheter was then directed into the right internal iliac artery. Right internal iliac artery angiogram demonstrated patency of the anterior and posterior divisions with the prostatic artery appearing to arise from the internal pudendal artery. A 1.9 French Progreat lambda microcatheter and standard 0.014 inch air Stahl wire within directed into the right internal pudendal artery. Angiogram was performed which demonstrated patency and origination of the right prostatic artery. The right prostatic artery was then selected and right prostatic artery angiogram was performed. Cone beam CT was then performed which demonstrated a proximal inferior rectal branch about the prostatic artery. There are no additional evidence of nontarget branch vessels. The inferior rectal artery was then selected and angiogram was performed which demonstrated no evidence of nontarget branch vessels. Next, proximal embolization of the right inferior rectal artery was performed with a single 1 mm x 5 mm low profile Ruby detachable coil. The catheter was retracted into the parent vessel and injection of contrast demonstrated adequate embolization and preserved patency of the right prostatic artery. The microcatheter was then directed deeper into the distal prostatic artery and particle embolization with dilute 400 micron hydro pleural see years was performed until  stasis was achieved. The catheter was then retracted into the proximal prostatic artery and completion arteriogram demonstrated adequate embolization of the prostatic parenchyma. The microcatheter was then removed. The 5 French base catheter was then directed under fluoroscopic guidance to the left internal iliac artery. Angiogram was performed which demonstrated patency of the posterior and anterior divisions with the prostatic artery arising from the mid obturator artery. The obturator artery was then selected with the straight lambda Progreat microcatheter and Aristotle 14 microwire. Angiogram was performed  in different obliquities to display the origin of the prostatic artery. There were several unsuccessful attempts with cannulating the prostatic artery including use of a fathom 14 microwire. Therefore, the catheter was exchanged for a triple angle lambda microcatheter which was then able to successfully select the prostatic artery origin with wireless technique. Angiogram was performed of the prostatic artery which demonstrated opacification of the prostate and no evidence of nontarget branch vessels. There was some spasm within the prostatic artery, therefore 200 mcg of nitroglycerin was administered intra-arterially. Next, particle embolization of the left hemi prostate was performed with 400 micron hydro pearls. Due to the spasm visualized on prior arteriogram, the proximal prostatic artery was embolized with several low-profile Ruby detachable microcoils to ensure complete embolization. The catheter was retracted into the obturator artery and completion angiogram was performed which demonstrated appropriate and complete embolization of the left prostatic artery. The catheters were removed. A TR band was placed about the left wrist and inflated than the indwelling slender sheath was removed successfully. The patient tolerated the procedure well was transferred back to the floor in good condition.  IMPRESSION: 1. Technically successful particle embolization of the right prostatic artery. 2. Shared origin of the right prostatic artery and right inferior rectal artery necessitating coil embolization of the right inferior rectal artery for embolic protection purposes. 3. Technically successful particle and coil embolization of the left prostatic artery. Marliss Coots, MD [facility] Radiology Electronically Signed   By: Marliss Coots M.D.   On: 01/17/2023 07:28   IR Angiogram Pelvis Selective Or Supraselective  Result Date: 01/17/2023 INDICATION: 77 year old male with history of prostatic hematuria, currently admitted as an inpatient. EXAM: 1. Ultrasound-guided vascular access of the left radial artery. 2. Selective catheterization and angiography of the right internal iliac, right internal pudendal, right inferior rectal, right prostatic, left common iliac, left internal iliac, left obturator, and left prostatic arteries. 3. Cone beam CT. 4. Coil embolization of right inferior rectal artery. 5. Right prostatic artery embolization. 6. Left prostatic artery embolization. MEDICATIONS: 200 mg ciprofloxacin, intravenous ANESTHESIA/SEDATION: Moderate (conscious) sedation was employed during this procedure. A total of Versed 2.5 mg and Fentanyl 100 mcg was administered intravenously. Moderate Sedation Time: 103 minutes. The patient's level of consciousness and vital signs were monitored continuously by radiology nursing throughout the procedure under my direct supervision. CONTRAST:  20 mL Omnipaque 300 FLUOROSCOPY: Radiation Exposure Index (as provided by the fluoroscopic device): 2,819 mGy Kerma COMPLICATIONS: None immediate. PROCEDURE: Informed consent was obtained from the patient following explanation of the procedure, risks, benefits and alternatives. The patient understands, agrees and consents for the procedure. All questions were addressed. A time out was  performed prior to the initiation of the procedure. Maximal barrier sterile technique utilized including caps, mask, sterile gowns, sterile gloves, large sterile drape, hand hygiene, and Betadine prep. The left wrist was prepped and draped in standard fashion. Pulse oximeter was attached to the left thumb. Tora Perches test was performed, grade A. The left radial artery measured 0.4 cm in diameter. Subdermal Local anesthesia was provided at the planned needle entry site with 1% lidocaine. A small skin nick was made. Under direct ultrasound visualization, the left radial artery was punctured with a 21 gauge micropuncture needle. A permanent image was captured and stored in the record. A microwire was placed and exchanged for a 4/5 French slender sheath. The sheath was flushed followed by installation of standard radial cocktail. A 5 French MG2 glide catheter and Bentson wire  were directed under fluoroscopic visualization through the left upper extremity, around the aortic arch, through the descending thoracic aorta and into the abdominal aorta. The catheter was then directed into the right internal iliac artery. Right internal iliac artery angiogram demonstrated patency of the anterior and posterior divisions with the prostatic artery appearing to arise from the internal pudendal artery. A 1.9 French Progreat lambda microcatheter and standard 0.014 inch air Stahl wire within directed into the right internal pudendal artery. Angiogram was performed which demonstrated patency and origination of the right prostatic artery. The right prostatic artery was then selected and right prostatic artery angiogram was performed. Cone beam CT was then performed which demonstrated a proximal inferior rectal branch about the prostatic artery. There are no additional evidence of nontarget branch vessels. The inferior rectal artery was then selected and angiogram was performed which demonstrated no evidence of nontarget branch vessels.  Next, proximal embolization of the right inferior rectal artery was performed with a single 1 mm x 5 mm low profile Ruby detachable coil. The catheter was retracted into the parent vessel and injection of contrast demonstrated adequate embolization and preserved patency of the right prostatic artery. The microcatheter was then directed deeper into the distal prostatic artery and particle embolization with dilute 400 micron hydro pleural see years was performed until stasis was achieved. The catheter was then retracted into the proximal prostatic artery and completion arteriogram demonstrated adequate embolization of the prostatic parenchyma. The microcatheter was then removed. The 5 French base catheter was then directed under fluoroscopic guidance to the left internal iliac artery. Angiogram was performed which demonstrated patency of the posterior and anterior divisions with the prostatic artery arising from the mid obturator artery. The obturator artery was then selected with the straight lambda Progreat microcatheter and Aristotle 14 microwire. Angiogram was performed in different obliquities to display the origin of the prostatic artery. There were several unsuccessful attempts with cannulating the prostatic artery including use of a fathom 14 microwire. Therefore, the catheter was exchanged for a triple angle lambda microcatheter which was then able to successfully select the prostatic artery origin with wireless technique. Angiogram was performed of the prostatic artery which demonstrated opacification of the prostate and no evidence of nontarget branch vessels. There was some spasm within the prostatic artery, therefore 200 mcg of nitroglycerin was administered intra-arterially. Next, particle embolization of the left hemi prostate was performed with 400 micron hydro pearls. Due to the spasm visualized on prior arteriogram, the proximal prostatic artery was embolized with several low-profile Ruby detachable  microcoils to ensure complete embolization. The catheter was retracted into the obturator artery and completion angiogram was performed which demonstrated appropriate and complete embolization of the left prostatic artery. The catheters were removed. A TR band was placed about the left wrist and inflated than the indwelling slender sheath was removed successfully. The patient tolerated the procedure well was transferred back to the floor in good condition. IMPRESSION: 1. Technically successful particle embolization of the right prostatic artery. 2. Shared origin of the right prostatic artery and right inferior rectal artery necessitating coil embolization of the right inferior rectal artery for embolic protection purposes. 3. Technically successful particle and coil embolization of the left prostatic artery. Marliss Coots, MD [facility] Radiology Electronically Signed   By: Marliss Coots M.D.   On: 01/17/2023 07:28   IR 3D Independent Annabell Sabal  Result Date: 01/17/2023 INDICATION: 77 year old male with history of prostatic hematuria, currently admitted as an inpatient. EXAM:  1. Ultrasound-guided vascular access of the left radial artery. 2. Selective catheterization and angiography of the right internal iliac, right internal pudendal, right inferior rectal, right prostatic, left common iliac, left internal iliac, left obturator, and left prostatic arteries. 3. Cone beam CT. 4. Coil embolization of right inferior rectal artery. 5. Right prostatic artery embolization. 6. Left prostatic artery embolization. MEDICATIONS: 200 mg ciprofloxacin, intravenous ANESTHESIA/SEDATION: Moderate (conscious) sedation was employed during this procedure. A total of Versed 2.5 mg and Fentanyl 100 mcg was administered intravenously. Moderate Sedation Time: 103 minutes. The patient's level of consciousness and vital signs were monitored continuously by radiology nursing throughout the  procedure under my direct supervision. CONTRAST:  20 mL Omnipaque 300 FLUOROSCOPY: Radiation Exposure Index (as provided by the fluoroscopic device): 2,819 mGy Kerma COMPLICATIONS: None immediate. PROCEDURE: Informed consent was obtained from the patient following explanation of the procedure, risks, benefits and alternatives. The patient understands, agrees and consents for the procedure. All questions were addressed. A time out was performed prior to the initiation of the procedure. Maximal barrier sterile technique utilized including caps, mask, sterile gowns, sterile gloves, large sterile drape, hand hygiene, and Betadine prep. The left wrist was prepped and draped in standard fashion. Pulse oximeter was attached to the left thumb. Tora Perches test was performed, grade A. The left radial artery measured 0.4 cm in diameter. Subdermal Local anesthesia was provided at the planned needle entry site with 1% lidocaine. A small skin nick was made. Under direct ultrasound visualization, the left radial artery was punctured with a 21 gauge micropuncture needle. A permanent image was captured and stored in the record. A microwire was placed and exchanged for a 4/5 French slender sheath. The sheath was flushed followed by installation of standard radial cocktail. A 5 French MG2 glide catheter and Bentson wire were directed under fluoroscopic visualization through the left upper extremity, around the aortic arch, through the descending thoracic aorta and into the abdominal aorta. The catheter was then directed into the right internal iliac artery. Right internal iliac artery angiogram demonstrated patency of the anterior and posterior divisions with the prostatic artery appearing to arise from the internal pudendal artery. A 1.9 French Progreat lambda microcatheter and standard 0.014 inch air Stahl wire within directed into the right internal pudendal artery. Angiogram was performed which demonstrated patency and origination of  the right prostatic artery. The right prostatic artery was then selected and right prostatic artery angiogram was performed. Cone beam CT was then performed which demonstrated a proximal inferior rectal branch about the prostatic artery. There are no additional evidence of nontarget branch vessels. The inferior rectal artery was then selected and angiogram was performed which demonstrated no evidence of nontarget branch vessels. Next, proximal embolization of the right inferior rectal artery was performed with a single 1 mm x 5 mm low profile Ruby detachable coil. The catheter was retracted into the parent vessel and injection of contrast demonstrated adequate embolization and preserved patency of the right prostatic artery. The microcatheter was then directed deeper into the distal prostatic artery and particle embolization with dilute 400 micron hydro pleural see years was performed until stasis was achieved. The catheter was then retracted into the proximal prostatic artery and completion arteriogram demonstrated adequate embolization of the prostatic parenchyma. The microcatheter was then removed. The 5 French base catheter was then directed under fluoroscopic guidance to the left internal iliac artery. Angiogram was performed which demonstrated patency of the posterior and anterior divisions with the prostatic artery arising from the  mid obturator artery. The obturator artery was then selected with the straight lambda Progreat microcatheter and Aristotle 14 microwire. Angiogram was performed in different obliquities to display the origin of the prostatic artery. There were several unsuccessful attempts with cannulating the prostatic artery including use of a fathom 14 microwire. Therefore, the catheter was exchanged for a triple angle lambda microcatheter which was then able to successfully select the prostatic artery origin with wireless technique. Angiogram was performed of the prostatic artery which  demonstrated opacification of the prostate and no evidence of nontarget branch vessels. There was some spasm within the prostatic artery, therefore 200 mcg of nitroglycerin was administered intra-arterially. Next, particle embolization of the left hemi prostate was performed with 400 micron hydro pearls. Due to the spasm visualized on prior arteriogram, the proximal prostatic artery was embolized with several low-profile Ruby detachable microcoils to ensure complete embolization. The catheter was retracted into the obturator artery and completion angiogram was performed which demonstrated appropriate and complete embolization of the left prostatic artery. The catheters were removed. A TR band was placed about the left wrist and inflated than the indwelling slender sheath was removed successfully. The patient tolerated the procedure well was transferred back to the floor in good condition. IMPRESSION: 1. Technically successful particle embolization of the right prostatic artery. 2. Shared origin of the right prostatic artery and right inferior rectal artery necessitating coil embolization of the right inferior rectal artery for embolic protection purposes. 3. Technically successful particle and coil embolization of the left prostatic artery. Marliss Coots, MD [facility] Radiology Electronically Signed   By: Marliss Coots M.D.   On: 01/17/2023 07:28   IR Angiogram Selective Each Additional Vessel  Result Date: 01/17/2023 INDICATION: 77 year old male with history of prostatic hematuria, currently admitted as an inpatient. EXAM: 1. Ultrasound-guided vascular access of the left radial artery. 2. Selective catheterization and angiography of the right internal iliac, right internal pudendal, right inferior rectal, right prostatic, left common iliac, left internal iliac, left obturator, and left prostatic arteries. 3. Cone beam CT. 4. Coil embolization of right inferior  rectal artery. 5. Right prostatic artery embolization. 6. Left prostatic artery embolization. MEDICATIONS: 200 mg ciprofloxacin, intravenous ANESTHESIA/SEDATION: Moderate (conscious) sedation was employed during this procedure. A total of Versed 2.5 mg and Fentanyl 100 mcg was administered intravenously. Moderate Sedation Time: 103 minutes. The patient's level of consciousness and vital signs were monitored continuously by radiology nursing throughout the procedure under my direct supervision. CONTRAST:  20 mL Omnipaque 300 FLUOROSCOPY: Radiation Exposure Index (as provided by the fluoroscopic device): 2,819 mGy Kerma COMPLICATIONS: None immediate. PROCEDURE: Informed consent was obtained from the patient following explanation of the procedure, risks, benefits and alternatives. The patient understands, agrees and consents for the procedure. All questions were addressed. A time out was performed prior to the initiation of the procedure. Maximal barrier sterile technique utilized including caps, mask, sterile gowns, sterile gloves, large sterile drape, hand hygiene, and Betadine prep. The left wrist was prepped and draped in standard fashion. Pulse oximeter was attached to the left thumb. Tora Perches test was performed, grade A. The left radial artery measured 0.4 cm in diameter. Subdermal Local anesthesia was provided at the planned needle entry site with 1% lidocaine. A small skin nick was made. Under direct ultrasound visualization, the left radial artery was punctured with a 21 gauge micropuncture needle. A permanent image was captured and stored in the record. A microwire was placed and exchanged for a 4/5 [place]  slender sheath. The sheath was flushed followed by installation of standard radial cocktail. A 5 French MG2 glide catheter and Bentson wire were directed under fluoroscopic visualization through the left upper extremity, around the aortic arch, through the descending thoracic aorta and into the abdominal  aorta. The catheter was then directed into the right internal iliac artery. Right internal iliac artery angiogram demonstrated patency of the anterior and posterior divisions with the prostatic artery appearing to arise from the internal pudendal artery. A 1.9 French Progreat lambda microcatheter and standard 0.014 inch air Stahl wire within directed into the right internal pudendal artery. Angiogram was performed which demonstrated patency and origination of the right prostatic artery. The right prostatic artery was then selected and right prostatic artery angiogram was performed. Cone beam CT was then performed which demonstrated a proximal inferior rectal branch about the prostatic artery. There are no additional evidence of nontarget branch vessels. The inferior rectal artery was then selected and angiogram was performed which demonstrated no evidence of nontarget branch vessels. Next, proximal embolization of the right inferior rectal artery was performed with a single 1 mm x 5 mm low profile Ruby detachable coil. The catheter was retracted into the parent vessel and injection of contrast demonstrated adequate embolization and preserved patency of the right prostatic artery. The microcatheter was then directed deeper into the distal prostatic artery and particle embolization with dilute 400 micron hydro pleural see years was performed until stasis was achieved. The catheter was then retracted into the proximal prostatic artery and completion arteriogram demonstrated adequate embolization of the prostatic parenchyma. The microcatheter was then removed. The 5 French base catheter was then directed under fluoroscopic guidance to the left internal iliac artery. Angiogram was performed which demonstrated patency of the posterior and anterior divisions with the prostatic artery arising from the mid obturator artery. The obturator artery was then selected with the straight lambda Progreat microcatheter and Aristotle  14 microwire. Angiogram was performed in different obliquities to display the origin of the prostatic artery. There were several unsuccessful attempts with cannulating the prostatic artery including use of a fathom 14 microwire. Therefore, the catheter was exchanged for a triple angle lambda microcatheter which was then able to successfully select the prostatic artery origin with wireless technique. Angiogram was performed of the prostatic artery which demonstrated opacification of the prostate and no evidence of nontarget branch vessels. There was some spasm within the prostatic artery, therefore 200 mcg of nitroglycerin was administered intra-arterially. Next, particle embolization of the left hemi prostate was performed with 400 micron hydro pearls. Due to the spasm visualized on prior arteriogram, the proximal prostatic artery was embolized with several low-profile Ruby detachable microcoils to ensure complete embolization. The catheter was retracted into the obturator artery and completion angiogram was performed which demonstrated appropriate and complete embolization of the left prostatic artery. The catheters were removed. A TR band was placed about the left wrist and inflated than the indwelling slender sheath was removed successfully. The patient tolerated the procedure well was transferred back to the floor in good condition. IMPRESSION: 1. Technically successful particle embolization of the right prostatic artery. 2. Shared origin of the right prostatic artery and right inferior rectal artery necessitating coil embolization of the right inferior rectal artery for embolic protection purposes. 3. Technically successful particle and coil embolization of the left prostatic artery. Marliss Coots, MD [facility] Radiology Electronically Signed   By: Marliss Coots M.D.   On: 01/17/2023 07:28  IR Angiogram Selective Each Additional Vessel  Result Date:  01/17/2023 INDICATION: 77 year old male with history of prostatic hematuria, currently admitted as an inpatient. EXAM: 1. Ultrasound-guided vascular access of the left radial artery. 2. Selective catheterization and angiography of the right internal iliac, right internal pudendal, right inferior rectal, right prostatic, left common iliac, left internal iliac, left obturator, and left prostatic arteries. 3. Cone beam CT. 4. Coil embolization of right inferior rectal artery. 5. Right prostatic artery embolization. 6. Left prostatic artery embolization. MEDICATIONS: 200 mg ciprofloxacin, intravenous ANESTHESIA/SEDATION: Moderate (conscious) sedation was employed during this procedure. A total of Versed 2.5 mg and Fentanyl 100 mcg was administered intravenously. Moderate Sedation Time: 103 minutes. The patient's level of consciousness and vital signs were monitored continuously by radiology nursing throughout the procedure under my direct supervision. CONTRAST:  20 mL Omnipaque 300 FLUOROSCOPY: Radiation Exposure Index (as provided by the fluoroscopic device): 2,819 mGy Kerma COMPLICATIONS: None immediate. PROCEDURE: Informed consent was obtained from the patient following explanation of the procedure, risks, benefits and alternatives. The patient understands, agrees and consents for the procedure. All questions were addressed. A time out was performed prior to the initiation of the procedure. Maximal barrier sterile technique utilized including caps, mask, sterile gowns, sterile gloves, large sterile drape, hand hygiene, and Betadine prep. The left wrist was prepped and draped in standard fashion. Pulse oximeter was attached to the left thumb. Tora Perches test was performed, grade A. The left radial artery measured 0.4 cm in diameter. Subdermal Local anesthesia was provided at the planned needle entry site with 1% lidocaine. A small skin nick was made. Under direct ultrasound visualization, the left radial artery was  punctured with a 21 gauge micropuncture needle. A permanent image was captured and stored in the record. A microwire was placed and exchanged for a 4/5 French slender sheath. The sheath was flushed followed by installation of standard radial cocktail. A 5 French MG2 glide catheter and Bentson wire were directed under fluoroscopic visualization through the left upper extremity, around the aortic arch, through the descending thoracic aorta and into the abdominal aorta. The catheter was then directed into the right internal iliac artery. Right internal iliac artery angiogram demonstrated patency of the anterior and posterior divisions with the prostatic artery appearing to arise from the internal pudendal artery. A 1.9 French Progreat lambda microcatheter and standard 0.014 inch air Stahl wire within directed into the right internal pudendal artery. Angiogram was performed which demonstrated patency and origination of the right prostatic artery. The right prostatic artery was then selected and right prostatic artery angiogram was performed. Cone beam CT was then performed which demonstrated a proximal inferior rectal branch about the prostatic artery. There are no additional evidence of nontarget branch vessels. The inferior rectal artery was then selected and angiogram was performed which demonstrated no evidence of nontarget branch vessels. Next, proximal embolization of the right inferior rectal artery was performed with a single 1 mm x 5 mm low profile Ruby detachable coil. The catheter was retracted into the parent vessel and injection of contrast demonstrated adequate embolization and preserved patency of the right prostatic artery. The microcatheter was then directed deeper into the distal prostatic artery and particle embolization with dilute 400 micron hydro pleural see years was performed until stasis was achieved. The catheter was then retracted into the proximal prostatic artery and completion arteriogram  demonstrated adequate embolization of the prostatic parenchyma. The microcatheter was then removed. The 5 French base catheter was then directed under fluoroscopic guidance  to the left internal iliac artery. Angiogram was performed which demonstrated patency of the posterior and anterior divisions with the prostatic artery arising from the mid obturator artery. The obturator artery was then selected with the straight lambda Progreat microcatheter and Aristotle 14 microwire. Angiogram was performed in different obliquities to display the origin of the prostatic artery. There were several unsuccessful attempts with cannulating the prostatic artery including use of a fathom 14 microwire. Therefore, the catheter was exchanged for a triple angle lambda microcatheter which was then able to successfully select the prostatic artery origin with wireless technique. Angiogram was performed of the prostatic artery which demonstrated opacification of the prostate and no evidence of nontarget branch vessels. There was some spasm within the prostatic artery, therefore 200 mcg of nitroglycerin was administered intra-arterially. Next, particle embolization of the left hemi prostate was performed with 400 micron hydro pearls. Due to the spasm visualized on prior arteriogram, the proximal prostatic artery was embolized with several low-profile Ruby detachable microcoils to ensure complete embolization. The catheter was retracted into the obturator artery and completion angiogram was performed which demonstrated appropriate and complete embolization of the left prostatic artery. The catheters were removed. A TR band was placed about the left wrist and inflated than the indwelling slender sheath was removed successfully. The patient tolerated the procedure well was transferred back to the floor in good condition. IMPRESSION: 1. Technically successful particle embolization of the right prostatic artery. 2. Shared origin of the right  prostatic artery and right inferior rectal artery necessitating coil embolization of the right inferior rectal artery for embolic protection purposes. 3. Technically successful particle and coil embolization of the left prostatic artery. Marliss Coots, MD [facility] Radiology Electronically Signed   By: Marliss Coots M.D.   On: 01/17/2023 07:28   IR CT PELVIS W/CM  Result Date: 01/17/2023 INDICATION: 77 year old male with history of prostatic hematuria, currently admitted as an inpatient. EXAM: 1. Ultrasound-guided vascular access of the left radial artery. 2. Selective catheterization and angiography of the right internal iliac, right internal pudendal, right inferior rectal, right prostatic, left common iliac, left internal iliac, left obturator, and left prostatic arteries. 3. Cone beam CT. 4. Coil embolization of right inferior rectal artery. 5. Right prostatic artery embolization. 6. Left prostatic artery embolization. MEDICATIONS: 200 mg ciprofloxacin, intravenous ANESTHESIA/SEDATION: Moderate (conscious) sedation was employed during this procedure. A total of Versed 2.5 mg and Fentanyl 100 mcg was administered intravenously. Moderate Sedation Time: 103 minutes. The patient's level of consciousness and vital signs were monitored continuously by radiology nursing throughout the procedure under my direct supervision. CONTRAST:  20 mL Omnipaque 300 FLUOROSCOPY: Radiation Exposure Index (as provided by the fluoroscopic device): 2,819 mGy Kerma COMPLICATIONS: None immediate. PROCEDURE: Informed consent was obtained from the patient following explanation of the procedure, risks, benefits and alternatives. The patient understands, agrees and consents for the procedure. All questions were addressed. A time out was performed prior to the initiation of the procedure. Maximal barrier sterile technique utilized including caps, mask, sterile gowns, sterile gloves, large  sterile drape, hand hygiene, and Betadine prep. The left wrist was prepped and draped in standard fashion. Pulse oximeter was attached to the left thumb. Tora Perches test was performed, grade A. The left radial artery measured 0.4 cm in diameter. Subdermal Local anesthesia was provided at the planned needle entry site with 1% lidocaine. A small skin nick was made. Under direct ultrasound visualization, the left radial artery was punctured with a 21  gauge micropuncture needle. A permanent image was captured and stored in the record. A microwire was placed and exchanged for a 4/5 French slender sheath. The sheath was flushed followed by installation of standard radial cocktail. A 5 French MG2 glide catheter and Bentson wire were directed under fluoroscopic visualization through the left upper extremity, around the aortic arch, through the descending thoracic aorta and into the abdominal aorta. The catheter was then directed into the right internal iliac artery. Right internal iliac artery angiogram demonstrated patency of the anterior and posterior divisions with the prostatic artery appearing to arise from the internal pudendal artery. A 1.9 French Progreat lambda microcatheter and standard 0.014 inch air Stahl wire within directed into the right internal pudendal artery. Angiogram was performed which demonstrated patency and origination of the right prostatic artery. The right prostatic artery was then selected and right prostatic artery angiogram was performed. Cone beam CT was then performed which demonstrated a proximal inferior rectal branch about the prostatic artery. There are no additional evidence of nontarget branch vessels. The inferior rectal artery was then selected and angiogram was performed which demonstrated no evidence of nontarget branch vessels. Next, proximal embolization of the right inferior rectal artery was performed with a single 1 mm x 5 mm low profile Ruby detachable coil. The catheter was  retracted into the parent vessel and injection of contrast demonstrated adequate embolization and preserved patency of the right prostatic artery. The microcatheter was then directed deeper into the distal prostatic artery and particle embolization with dilute 400 micron hydro pleural see years was performed until stasis was achieved. The catheter was then retracted into the proximal prostatic artery and completion arteriogram demonstrated adequate embolization of the prostatic parenchyma. The microcatheter was then removed. The 5 French base catheter was then directed under fluoroscopic guidance to the left internal iliac artery. Angiogram was performed which demonstrated patency of the posterior and anterior divisions with the prostatic artery arising from the mid obturator artery. The obturator artery was then selected with the straight lambda Progreat microcatheter and Aristotle 14 microwire. Angiogram was performed in different obliquities to display the origin of the prostatic artery. There were several unsuccessful attempts with cannulating the prostatic artery including use of a fathom 14 microwire. Therefore, the catheter was exchanged for a triple angle lambda microcatheter which was then able to successfully select the prostatic artery origin with wireless technique. Angiogram was performed of the prostatic artery which demonstrated opacification of the prostate and no evidence of nontarget branch vessels. There was some spasm within the prostatic artery, therefore 200 mcg of nitroglycerin was administered intra-arterially. Next, particle embolization of the left hemi prostate was performed with 400 micron hydro pearls. Due to the spasm visualized on prior arteriogram, the proximal prostatic artery was embolized with several low-profile Ruby detachable microcoils to ensure complete embolization. The catheter was retracted into the obturator artery and completion angiogram was performed which demonstrated  appropriate and complete embolization of the left prostatic artery. The catheters were removed. A TR band was placed about the left wrist and inflated than the indwelling slender sheath was removed successfully. The patient tolerated the procedure well was transferred back to the floor in good condition. IMPRESSION: 1. Technically successful particle embolization of the right prostatic artery. 2. Shared origin of the right prostatic artery and right inferior rectal artery necessitating coil embolization of the right inferior rectal artery for embolic protection purposes. 3. Technically successful particle and coil embolization of the left prostatic artery. Marliss Coots, MD  [facility] Radiology Electronically Signed   By: Marliss Coots M.D.   On: 01/17/2023 07:28   IR Angiogram Selective Each Additional Vessel  Result Date: 01/17/2023 INDICATION: 77 year old male with history of prostatic hematuria, currently admitted as an inpatient. EXAM: 1. Ultrasound-guided vascular access of the left radial artery. 2. Selective catheterization and angiography of the right internal iliac, right internal pudendal, right inferior rectal, right prostatic, left common iliac, left internal iliac, left obturator, and left prostatic arteries. 3. Cone beam CT. 4. Coil embolization of right inferior rectal artery. 5. Right prostatic artery embolization. 6. Left prostatic artery embolization. MEDICATIONS: 200 mg ciprofloxacin, intravenous ANESTHESIA/SEDATION: Moderate (conscious) sedation was employed during this procedure. A total of Versed 2.5 mg and Fentanyl 100 mcg was administered intravenously. Moderate Sedation Time: 103 minutes. The patient's level of consciousness and vital signs were monitored continuously by radiology nursing throughout the procedure under my direct supervision. CONTRAST:  20 mL Omnipaque 300 FLUOROSCOPY: Radiation Exposure Index (as provided by the  fluoroscopic device): 2,819 mGy Kerma COMPLICATIONS: None immediate. PROCEDURE: Informed consent was obtained from the patient following explanation of the procedure, risks, benefits and alternatives. The patient understands, agrees and consents for the procedure. All questions were addressed. A time out was performed prior to the initiation of the procedure. Maximal barrier sterile technique utilized including caps, mask, sterile gowns, sterile gloves, large sterile drape, hand hygiene, and Betadine prep. The left wrist was prepped and draped in standard fashion. Pulse oximeter was attached to the left thumb. Tora Perches test was performed, grade A. The left radial artery measured 0.4 cm in diameter. Subdermal Local anesthesia was provided at the planned needle entry site with 1% lidocaine. A small skin nick was made. Under direct ultrasound visualization, the left radial artery was punctured with a 21 gauge micropuncture needle. A permanent image was captured and stored in the record. A microwire was placed and exchanged for a 4/5 French slender sheath. The sheath was flushed followed by installation of standard radial cocktail. A 5 French MG2 glide catheter and Bentson wire were directed under fluoroscopic visualization through the left upper extremity, around the aortic arch, through the descending thoracic aorta and into the abdominal aorta. The catheter was then directed into the right internal iliac artery. Right internal iliac artery angiogram demonstrated patency of the anterior and posterior divisions with the prostatic artery appearing to arise from the internal pudendal artery. A 1.9 French Progreat lambda microcatheter and standard 0.014 inch air Stahl wire within directed into the right internal pudendal artery. Angiogram was performed which demonstrated patency and origination of the right prostatic artery. The right prostatic artery was then selected and right prostatic artery angiogram was performed.  Cone beam CT was then performed which demonstrated a proximal inferior rectal branch about the prostatic artery. There are no additional evidence of nontarget branch vessels. The inferior rectal artery was then selected and angiogram was performed which demonstrated no evidence of nontarget branch vessels. Next, proximal embolization of the right inferior rectal artery was performed with a single 1 mm x 5 mm low profile Ruby detachable coil. The catheter was retracted into the parent vessel and injection of contrast demonstrated adequate embolization and preserved patency of the right prostatic artery. The microcatheter was then directed deeper into the distal prostatic artery and particle embolization with dilute 400 micron hydro pleural see years was performed until stasis was achieved. The catheter was then retracted into the proximal prostatic artery and completion arteriogram demonstrated  adequate embolization of the prostatic parenchyma. The microcatheter was then removed. The 5 French base catheter was then directed under fluoroscopic guidance to the left internal iliac artery. Angiogram was performed which demonstrated patency of the posterior and anterior divisions with the prostatic artery arising from the mid obturator artery. The obturator artery was then selected with the straight lambda Progreat microcatheter and Aristotle 14 microwire. Angiogram was performed in different obliquities to display the origin of the prostatic artery. There were several unsuccessful attempts with cannulating the prostatic artery including use of a fathom 14 microwire. Therefore, the catheter was exchanged for a triple angle lambda microcatheter which was then able to successfully select the prostatic artery origin with wireless technique. Angiogram was performed of the prostatic artery which demonstrated opacification of the prostate and no evidence of nontarget branch vessels. There was some spasm within the prostatic  artery, therefore 200 mcg of nitroglycerin was administered intra-arterially. Next, particle embolization of the left hemi prostate was performed with 400 micron hydro pearls. Due to the spasm visualized on prior arteriogram, the proximal prostatic artery was embolized with several low-profile Ruby detachable microcoils to ensure complete embolization. The catheter was retracted into the obturator artery and completion angiogram was performed which demonstrated appropriate and complete embolization of the left prostatic artery. The catheters were removed. A TR band was placed about the left wrist and inflated than the indwelling slender sheath was removed successfully. The patient tolerated the procedure well was transferred back to the floor in good condition. IMPRESSION: 1. Technically successful particle embolization of the right prostatic artery. 2. Shared origin of the right prostatic artery and right inferior rectal artery necessitating coil embolization of the right inferior rectal artery for embolic protection purposes. 3. Technically successful particle and coil embolization of the left prostatic artery. Marliss Coots, MD [facility] Radiology Electronically Signed   By: Marliss Coots M.D.   On: 01/17/2023 07:28   IR Angiogram Selective Each Additional Vessel  Result Date: 01/17/2023 INDICATION: 77 year old male with history of prostatic hematuria, currently admitted as an inpatient. EXAM: 1. Ultrasound-guided vascular access of the left radial artery. 2. Selective catheterization and angiography of the right internal iliac, right internal pudendal, right inferior rectal, right prostatic, left common iliac, left internal iliac, left obturator, and left prostatic arteries. 3. Cone beam CT. 4. Coil embolization of right inferior rectal artery. 5. Right prostatic artery embolization. 6. Left prostatic artery embolization. MEDICATIONS: 200 mg ciprofloxacin,  intravenous ANESTHESIA/SEDATION: Moderate (conscious) sedation was employed during this procedure. A total of Versed 2.5 mg and Fentanyl 100 mcg was administered intravenously. Moderate Sedation Time: 103 minutes. The patient's level of consciousness and vital signs were monitored continuously by radiology nursing throughout the procedure under my direct supervision. CONTRAST:  20 mL Omnipaque 300 FLUOROSCOPY: Radiation Exposure Index (as provided by the fluoroscopic device): 2,819 mGy Kerma COMPLICATIONS: None immediate. PROCEDURE: Informed consent was obtained from the patient following explanation of the procedure, risks, benefits and alternatives. The patient understands, agrees and consents for the procedure. All questions were addressed. A time out was performed prior to the initiation of the procedure. Maximal barrier sterile technique utilized including caps, mask, sterile gowns, sterile gloves, large sterile drape, hand hygiene, and Betadine prep. The left wrist was prepped and draped in standard fashion. Pulse oximeter was attached to the left thumb. Tora Perches test was performed, grade A. The left radial artery measured 0.4 cm in diameter. Subdermal Local anesthesia was provided at the planned needle  entry site with 1% lidocaine. A small skin nick was made. Under direct ultrasound visualization, the left radial artery was punctured with a 21 gauge micropuncture needle. A permanent image was captured and stored in the record. A microwire was placed and exchanged for a 4/5 French slender sheath. The sheath was flushed followed by installation of standard radial cocktail. A 5 French MG2 glide catheter and Bentson wire were directed under fluoroscopic visualization through the left upper extremity, around the aortic arch, through the descending thoracic aorta and into the abdominal aorta. The catheter was then directed into the right internal iliac artery. Right internal iliac artery angiogram demonstrated  patency of the anterior and posterior divisions with the prostatic artery appearing to arise from the internal pudendal artery. A 1.9 French Progreat lambda microcatheter and standard 0.014 inch air Stahl wire within directed into the right internal pudendal artery. Angiogram was performed which demonstrated patency and origination of the right prostatic artery. The right prostatic artery was then selected and right prostatic artery angiogram was performed. Cone beam CT was then performed which demonstrated a proximal inferior rectal branch about the prostatic artery. There are no additional evidence of nontarget branch vessels. The inferior rectal artery was then selected and angiogram was performed which demonstrated no evidence of nontarget branch vessels. Next, proximal embolization of the right inferior rectal artery was performed with a single 1 mm x 5 mm low profile Ruby detachable coil. The catheter was retracted into the parent vessel and injection of contrast demonstrated adequate embolization and preserved patency of the right prostatic artery. The microcatheter was then directed deeper into the distal prostatic artery and particle embolization with dilute 400 micron hydro pleural see years was performed until stasis was achieved. The catheter was then retracted into the proximal prostatic artery and completion arteriogram demonstrated adequate embolization of the prostatic parenchyma. The microcatheter was then removed. The 5 French base catheter was then directed under fluoroscopic guidance to the left internal iliac artery. Angiogram was performed which demonstrated patency of the posterior and anterior divisions with the prostatic artery arising from the mid obturator artery. The obturator artery was then selected with the straight lambda Progreat microcatheter and Aristotle 14 microwire. Angiogram was performed in different obliquities to display the origin of the prostatic artery. There were several  unsuccessful attempts with cannulating the prostatic artery including use of a fathom 14 microwire. Therefore, the catheter was exchanged for a triple angle lambda microcatheter which was then able to successfully select the prostatic artery origin with wireless technique. Angiogram was performed of the prostatic artery which demonstrated opacification of the prostate and no evidence of nontarget branch vessels. There was some spasm within the prostatic artery, therefore 200 mcg of nitroglycerin was administered intra-arterially. Next, particle embolization of the left hemi prostate was performed with 400 micron hydro pearls. Due to the spasm visualized on prior arteriogram, the proximal prostatic artery was embolized with several low-profile Ruby detachable microcoils to ensure complete embolization. The catheter was retracted into the obturator artery and completion angiogram was performed which demonstrated appropriate and complete embolization of the left prostatic artery. The catheters were removed. A TR band was placed about the left wrist and inflated than the indwelling slender sheath was removed successfully. The patient tolerated the procedure well was transferred back to the floor in good condition. IMPRESSION: 1. Technically successful particle embolization of the right prostatic artery. 2. Shared origin of the right prostatic artery and right inferior rectal artery necessitating coil embolization of  the right inferior rectal artery for embolic protection purposes. 3. Technically successful particle and coil embolization of the left prostatic artery. Marliss Coots, MD [facility] Radiology Electronically Signed   By: Marliss Coots M.D.   On: 01/17/2023 07:28   IR Angiogram Selective Each Additional Vessel  Result Date: 01/17/2023 INDICATION: 77 year old male with history of prostatic hematuria, currently admitted as an inpatient. EXAM: 1.  Ultrasound-guided vascular access of the left radial artery. 2. Selective catheterization and angiography of the right internal iliac, right internal pudendal, right inferior rectal, right prostatic, left common iliac, left internal iliac, left obturator, and left prostatic arteries. 3. Cone beam CT. 4. Coil embolization of right inferior rectal artery. 5. Right prostatic artery embolization. 6. Left prostatic artery embolization. MEDICATIONS: 200 mg ciprofloxacin, intravenous ANESTHESIA/SEDATION: Moderate (conscious) sedation was employed during this procedure. A total of Versed 2.5 mg and Fentanyl 100 mcg was administered intravenously. Moderate Sedation Time: 103 minutes. The patient's level of consciousness and vital signs were monitored continuously by radiology nursing throughout the procedure under my direct supervision. CONTRAST:  20 mL Omnipaque 300 FLUOROSCOPY: Radiation Exposure Index (as provided by the fluoroscopic device): 2,819 mGy Kerma COMPLICATIONS: None immediate. PROCEDURE: Informed consent was obtained from the patient following explanation of the procedure, risks, benefits and alternatives. The patient understands, agrees and consents for the procedure. All questions were addressed. A time out was performed prior to the initiation of the procedure. Maximal barrier sterile technique utilized including caps, mask, sterile gowns, sterile gloves, large sterile drape, hand hygiene, and Betadine prep. The left wrist was prepped and draped in standard fashion. Pulse oximeter was attached to the left thumb. Tora Perches test was performed, grade A. The left radial artery measured 0.4 cm in diameter. Subdermal Local anesthesia was provided at the planned needle entry site with 1% lidocaine. A small skin nick was made. Under direct ultrasound visualization, the left radial artery was punctured with a 21 gauge micropuncture needle. A permanent image was captured and stored in the record. A microwire was placed  and exchanged for a 4/5 French slender sheath. The sheath was flushed followed by installation of standard radial cocktail. A 5 French MG2 glide catheter and Bentson wire were directed under fluoroscopic visualization through the left upper extremity, around the aortic arch, through the descending thoracic aorta and into the abdominal aorta. The catheter was then directed into the right internal iliac artery. Right internal iliac artery angiogram demonstrated patency of the anterior and posterior divisions with the prostatic artery appearing to arise from the internal pudendal artery. A 1.9 French Progreat lambda microcatheter and standard 0.014 inch air Stahl wire within directed into the right internal pudendal artery. Angiogram was performed which demonstrated patency and origination of the right prostatic artery. The right prostatic artery was then selected and right prostatic artery angiogram was performed. Cone beam CT was then performed which demonstrated a proximal inferior rectal branch about the prostatic artery. There are no additional evidence of nontarget branch vessels. The inferior rectal artery was then selected and angiogram was performed which demonstrated no evidence of nontarget branch vessels. Next, proximal embolization of the right inferior rectal artery was performed with a single 1 mm x 5 mm low profile Ruby detachable coil. The catheter was retracted into the parent vessel and injection of contrast demonstrated adequate embolization and preserved patency of the right prostatic artery. The microcatheter was then directed deeper into the distal prostatic artery and particle embolization with dilute 400 micron  hydro pleural see years was performed until stasis was achieved. The catheter was then retracted into the proximal prostatic artery and completion arteriogram demonstrated adequate embolization of the prostatic parenchyma. The microcatheter was then removed. The 5 French base catheter  was then directed under fluoroscopic guidance to the left internal iliac artery. Angiogram was performed which demonstrated patency of the posterior and anterior divisions with the prostatic artery arising from the mid obturator artery. The obturator artery was then selected with the straight lambda Progreat microcatheter and Aristotle 14 microwire. Angiogram was performed in different obliquities to display the origin of the prostatic artery. There were several unsuccessful attempts with cannulating the prostatic artery including use of a fathom 14 microwire. Therefore, the catheter was exchanged for a triple angle lambda microcatheter which was then able to successfully select the prostatic artery origin with wireless technique. Angiogram was performed of the prostatic artery which demonstrated opacification of the prostate and no evidence of nontarget branch vessels. There was some spasm within the prostatic artery, therefore 200 mcg of nitroglycerin was administered intra-arterially. Next, particle embolization of the left hemi prostate was performed with 400 micron hydro pearls. Due to the spasm visualized on prior arteriogram, the proximal prostatic artery was embolized with several low-profile Ruby detachable microcoils to ensure complete embolization. The catheter was retracted into the obturator artery and completion angiogram was performed which demonstrated appropriate and complete embolization of the left prostatic artery. The catheters were removed. A TR band was placed about the left wrist and inflated than the indwelling slender sheath was removed successfully. The patient tolerated the procedure well was transferred back to the floor in good condition. IMPRESSION: 1. Technically successful particle embolization of the right prostatic artery. 2. Shared origin of the right prostatic artery and right inferior rectal artery necessitating coil embolization of the right inferior rectal artery for embolic  protection purposes. 3. Technically successful particle and coil embolization of the left prostatic artery. Marliss Coots, MD [facility] Radiology Electronically Signed   By: Marliss Coots M.D.   On: 01/17/2023 07:28   IR EMBO TUMOR ORGAN ISCHEMIA INFARCT INC GUIDE ROADMAPPING  Result Date: 01/17/2023 INDICATION: 77 year old male with history of prostatic hematuria, currently admitted as an inpatient. EXAM: 1. Ultrasound-guided vascular access of the left radial artery. 2. Selective catheterization and angiography of the right internal iliac, right internal pudendal, right inferior rectal, right prostatic, left common iliac, left internal iliac, left obturator, and left prostatic arteries. 3. Cone beam CT. 4. Coil embolization of right inferior rectal artery. 5. Right prostatic artery embolization. 6. Left prostatic artery embolization. MEDICATIONS: 200 mg ciprofloxacin, intravenous ANESTHESIA/SEDATION: Moderate (conscious) sedation was employed during this procedure. A total of Versed 2.5 mg and Fentanyl 100 mcg was administered intravenously. Moderate Sedation Time: 103 minutes. The patient's level of consciousness and vital signs were monitored continuously by radiology nursing throughout the procedure under my direct supervision. CONTRAST:  20 mL Omnipaque 300 FLUOROSCOPY: Radiation Exposure Index (as provided by the fluoroscopic device): 2,819 mGy Kerma COMPLICATIONS: None immediate. PROCEDURE: Informed consent was obtained from the patient following explanation of the procedure, risks, benefits and alternatives. The patient understands, agrees and consents for the procedure. All questions were addressed. A time out was performed prior to the initiation of the procedure. Maximal barrier sterile technique utilized including caps, mask, sterile gowns, sterile gloves, large sterile drape, hand hygiene, and Betadine prep. The left wrist was prepped and draped in  standard fashion. Pulse oximeter was attached  to the left thumb. Tora Perches test was performed, grade A. The left radial artery measured 0.4 cm in diameter. Subdermal Local anesthesia was provided at the planned needle entry site with 1% lidocaine. A small skin nick was made. Under direct ultrasound visualization, the left radial artery was punctured with a 21 gauge micropuncture needle. A permanent image was captured and stored in the record. A microwire was placed and exchanged for a 4/5 French slender sheath. The sheath was flushed followed by installation of standard radial cocktail. A 5 French MG2 glide catheter and Bentson wire were directed under fluoroscopic visualization through the left upper extremity, around the aortic arch, through the descending thoracic aorta and into the abdominal aorta. The catheter was then directed into the right internal iliac artery. Right internal iliac artery angiogram demonstrated patency of the anterior and posterior divisions with the prostatic artery appearing to arise from the internal pudendal artery. A 1.9 French Progreat lambda microcatheter and standard 0.014 inch air Stahl wire within directed into the right internal pudendal artery. Angiogram was performed which demonstrated patency and origination of the right prostatic artery. The right prostatic artery was then selected and right prostatic artery angiogram was performed. Cone beam CT was then performed which demonstrated a proximal inferior rectal branch about the prostatic artery. There are no additional evidence of nontarget branch vessels. The inferior rectal artery was then selected and angiogram was performed which demonstrated no evidence of nontarget branch vessels. Next, proximal embolization of the right inferior rectal artery was performed with a single 1 mm x 5 mm low profile Ruby detachable coil. The catheter was retracted into the parent vessel and injection of contrast demonstrated adequate  embolization and preserved patency of the right prostatic artery. The microcatheter was then directed deeper into the distal prostatic artery and particle embolization with dilute 400 micron hydro pleural see years was performed until stasis was achieved. The catheter was then retracted into the proximal prostatic artery and completion arteriogram demonstrated adequate embolization of the prostatic parenchyma. The microcatheter was then removed. The 5 French base catheter was then directed under fluoroscopic guidance to the left internal iliac artery. Angiogram was performed which demonstrated patency of the posterior and anterior divisions with the prostatic artery arising from the mid obturator artery. The obturator artery was then selected with the straight lambda Progreat microcatheter and Aristotle 14 microwire. Angiogram was performed in different obliquities to display the origin of the prostatic artery. There were several unsuccessful attempts with cannulating the prostatic artery including use of a fathom 14 microwire. Therefore, the catheter was exchanged for a triple angle lambda microcatheter which was then able to successfully select the prostatic artery origin with wireless technique. Angiogram was performed of the prostatic artery which demonstrated opacification of the prostate and no evidence of nontarget branch vessels. There was some spasm within the prostatic artery, therefore 200 mcg of nitroglycerin was administered intra-arterially. Next, particle embolization of the left hemi prostate was performed with 400 micron hydro pearls. Due to the spasm visualized on prior arteriogram, the proximal prostatic artery was embolized with several low-profile Ruby detachable microcoils to ensure complete embolization. The catheter was retracted into the obturator artery and completion angiogram was performed which demonstrated appropriate and complete embolization of the left prostatic artery. The catheters  were removed. A TR band was placed about the left wrist and inflated than the indwelling slender sheath was removed successfully. The patient tolerated the procedure well was transferred back to the floor in good condition.  IMPRESSION: 1. Technically successful particle embolization of the right prostatic artery. 2. Shared origin of the right prostatic artery and right inferior rectal artery necessitating coil embolization of the right inferior rectal artery for embolic protection purposes. 3. Technically successful particle and coil embolization of the left prostatic artery. Marliss Coots, MD [facility] Radiology Electronically Signed   By: Marliss Coots M.D.   On: 01/17/2023 07:28   IR EMBO TUMOR ORGAN ISCHEMIA INFARCT INC GUIDE ROADMAPPING  Result Date: 01/17/2023 INDICATION: 77 year old male with history of prostatic hematuria, currently admitted as an inpatient. EXAM: 1. Ultrasound-guided vascular access of the left radial artery. 2. Selective catheterization and angiography of the right internal iliac, right internal pudendal, right inferior rectal, right prostatic, left common iliac, left internal iliac, left obturator, and left prostatic arteries. 3. Cone beam CT. 4. Coil embolization of right inferior rectal artery. 5. Right prostatic artery embolization. 6. Left prostatic artery embolization. MEDICATIONS: 200 mg ciprofloxacin, intravenous ANESTHESIA/SEDATION: Moderate (conscious) sedation was employed during this procedure. A total of Versed 2.5 mg and Fentanyl 100 mcg was administered intravenously. Moderate Sedation Time: 103 minutes. The patient's level of consciousness and vital signs were monitored continuously by radiology nursing throughout the procedure under my direct supervision. CONTRAST:  20 mL Omnipaque 300 FLUOROSCOPY: Radiation Exposure Index (as provided by the fluoroscopic device): 2,819 mGy Kerma COMPLICATIONS: None immediate. PROCEDURE:  Informed consent was obtained from the patient following explanation of the procedure, risks, benefits and alternatives. The patient understands, agrees and consents for the procedure. All questions were addressed. A time out was performed prior to the initiation of the procedure. Maximal barrier sterile technique utilized including caps, mask, sterile gowns, sterile gloves, large sterile drape, hand hygiene, and Betadine prep. The left wrist was prepped and draped in standard fashion. Pulse oximeter was attached to the left thumb. Tora Perches test was performed, grade A. The left radial artery measured 0.4 cm in diameter. Subdermal Local anesthesia was provided at the planned needle entry site with 1% lidocaine. A small skin nick was made. Under direct ultrasound visualization, the left radial artery was punctured with a 21 gauge micropuncture needle. A permanent image was captured and stored in the record. A microwire was placed and exchanged for a 4/5 French slender sheath. The sheath was flushed followed by installation of standard radial cocktail. A 5 French MG2 glide catheter and Bentson wire were directed under fluoroscopic visualization through the left upper extremity, around the aortic arch, through the descending thoracic aorta and into the abdominal aorta. The catheter was then directed into the right internal iliac artery. Right internal iliac artery angiogram demonstrated patency of the anterior and posterior divisions with the prostatic artery appearing to arise from the internal pudendal artery. A 1.9 French Progreat lambda microcatheter and standard 0.014 inch air Stahl wire within directed into the right internal pudendal artery. Angiogram was performed which demonstrated patency and origination of the right prostatic artery. The right prostatic artery was then selected and right prostatic artery angiogram was performed. Cone beam CT was then performed which demonstrated a proximal inferior rectal  branch about the prostatic artery. There are no additional evidence of nontarget branch vessels. The inferior rectal artery was then selected and angiogram was performed which demonstrated no evidence of nontarget branch vessels. Next, proximal embolization of the right inferior rectal artery was performed with a single 1 mm x 5 mm low profile Ruby detachable coil. The catheter was retracted into the parent vessel and injection  of contrast demonstrated adequate embolization and preserved patency of the right prostatic artery. The microcatheter was then directed deeper into the distal prostatic artery and particle embolization with dilute 400 micron hydro pleural see years was performed until stasis was achieved. The catheter was then retracted into the proximal prostatic artery and completion arteriogram demonstrated adequate embolization of the prostatic parenchyma. The microcatheter was then removed. The 5 French base catheter was then directed under fluoroscopic guidance to the left internal iliac artery. Angiogram was performed which demonstrated patency of the posterior and anterior divisions with the prostatic artery arising from the mid obturator artery. The obturator artery was then selected with the straight lambda Progreat microcatheter and Aristotle 14 microwire. Angiogram was performed in different obliquities to display the origin of the prostatic artery. There were several unsuccessful attempts with cannulating the prostatic artery including use of a fathom 14 microwire. Therefore, the catheter was exchanged for a triple angle lambda microcatheter which was then able to successfully select the prostatic artery origin with wireless technique. Angiogram was performed of the prostatic artery which demonstrated opacification of the prostate and no evidence of nontarget branch vessels. There was some spasm within the prostatic artery, therefore 200 mcg of nitroglycerin was administered intra-arterially.  Next, particle embolization of the left hemi prostate was performed with 400 micron hydro pearls. Due to the spasm visualized on prior arteriogram, the proximal prostatic artery was embolized with several low-profile Ruby detachable microcoils to ensure complete embolization. The catheter was retracted into the obturator artery and completion angiogram was performed which demonstrated appropriate and complete embolization of the left prostatic artery. The catheters were removed. A TR band was placed about the left wrist and inflated than the indwelling slender sheath was removed successfully. The patient tolerated the procedure well was transferred back to the floor in good condition. IMPRESSION: 1. Technically successful particle embolization of the right prostatic artery. 2. Shared origin of the right prostatic artery and right inferior rectal artery necessitating coil embolization of the right inferior rectal artery for embolic protection purposes. 3. Technically successful particle and coil embolization of the left prostatic artery. Marliss Coots, MD [facility] Radiology Electronically Signed   By: Marliss Coots M.D.   On: 01/17/2023 07:28   IR Angiogram Selective Each Additional Vessel  Result Date: 01/17/2023 INDICATION: 77 year old male with history of prostatic hematuria, currently admitted as an inpatient. EXAM: 1. Ultrasound-guided vascular access of the left radial artery. 2. Selective catheterization and angiography of the right internal iliac, right internal pudendal, right inferior rectal, right prostatic, left common iliac, left internal iliac, left obturator, and left prostatic arteries. 3. Cone beam CT. 4. Coil embolization of right inferior rectal artery. 5. Right prostatic artery embolization. 6. Left prostatic artery embolization. MEDICATIONS: 200 mg ciprofloxacin, intravenous ANESTHESIA/SEDATION: Moderate (conscious) sedation was employed  during this procedure. A total of Versed 2.5 mg and Fentanyl 100 mcg was administered intravenously. Moderate Sedation Time: 103 minutes. The patient's level of consciousness and vital signs were monitored continuously by radiology nursing throughout the procedure under my direct supervision. CONTRAST:  20 mL Omnipaque 300 FLUOROSCOPY: Radiation Exposure Index (as provided by the fluoroscopic device): 2,819 mGy Kerma COMPLICATIONS: None immediate. PROCEDURE: Informed consent was obtained from the patient following explanation of the procedure, risks, benefits and alternatives. The patient understands, agrees and consents for the procedure. All questions were addressed. A time out was performed prior to the initiation of the procedure. Maximal barrier sterile technique utilized including caps,  mask, sterile gowns, sterile gloves, large sterile drape, hand hygiene, and Betadine prep. The left wrist was prepped and draped in standard fashion. Pulse oximeter was attached to the left thumb. Tora Perches test was performed, grade A. The left radial artery measured 0.4 cm in diameter. Subdermal Local anesthesia was provided at the planned needle entry site with 1% lidocaine. A small skin nick was made. Under direct ultrasound visualization, the left radial artery was punctured with a 21 gauge micropuncture needle. A permanent image was captured and stored in the record. A microwire was placed and exchanged for a 4/5 French slender sheath. The sheath was flushed followed by installation of standard radial cocktail. A 5 French MG2 glide catheter and Bentson wire were directed under fluoroscopic visualization through the left upper extremity, around the aortic arch, through the descending thoracic aorta and into the abdominal aorta. The catheter was then directed into the right internal iliac artery. Right internal iliac artery angiogram demonstrated patency of the anterior and posterior divisions with the prostatic artery  appearing to arise from the internal pudendal artery. A 1.9 French Progreat lambda microcatheter and standard 0.014 inch air Stahl wire within directed into the right internal pudendal artery. Angiogram was performed which demonstrated patency and origination of the right prostatic artery. The right prostatic artery was then selected and right prostatic artery angiogram was performed. Cone beam CT was then performed which demonstrated a proximal inferior rectal branch about the prostatic artery. There are no additional evidence of nontarget branch vessels. The inferior rectal artery was then selected and angiogram was performed which demonstrated no evidence of nontarget branch vessels. Next, proximal embolization of the right inferior rectal artery was performed with a single 1 mm x 5 mm low profile Ruby detachable coil. The catheter was retracted into the parent vessel and injection of contrast demonstrated adequate embolization and preserved patency of the right prostatic artery. The microcatheter was then directed deeper into the distal prostatic artery and particle embolization with dilute 400 micron hydro pleural see years was performed until stasis was achieved. The catheter was then retracted into the proximal prostatic artery and completion arteriogram demonstrated adequate embolization of the prostatic parenchyma. The microcatheter was then removed. The 5 French base catheter was then directed under fluoroscopic guidance to the left internal iliac artery. Angiogram was performed which demonstrated patency of the posterior and anterior divisions with the prostatic artery arising from the mid obturator artery. The obturator artery was then selected with the straight lambda Progreat microcatheter and Aristotle 14 microwire. Angiogram was performed in different obliquities to display the origin of the prostatic artery. There were several unsuccessful attempts with cannulating the prostatic artery including use  of a fathom 14 microwire. Therefore, the catheter was exchanged for a triple angle lambda microcatheter which was then able to successfully select the prostatic artery origin with wireless technique. Angiogram was performed of the prostatic artery which demonstrated opacification of the prostate and no evidence of nontarget branch vessels. There was some spasm within the prostatic artery, therefore 200 mcg of nitroglycerin was administered intra-arterially. Next, particle embolization of the left hemi prostate was performed with 400 micron hydro pearls. Due to the spasm visualized on prior arteriogram, the proximal prostatic artery was embolized with several low-profile Ruby detachable microcoils to ensure complete embolization. The catheter was retracted into the obturator artery and completion angiogram was performed which demonstrated appropriate and complete embolization of the left prostatic artery. The catheters were removed. A TR band was placed about the  left wrist and inflated than the indwelling slender sheath was removed successfully. The patient tolerated the procedure well was transferred back to the floor in good condition. IMPRESSION: 1. Technically successful particle embolization of the right prostatic artery. 2. Shared origin of the right prostatic artery and right inferior rectal artery necessitating coil embolization of the right inferior rectal artery for embolic protection purposes. 3. Technically successful particle and coil embolization of the left prostatic artery. Marliss Coots, MD [facility] Radiology Electronically Signed   By: Marliss Coots M.D.   On: 01/17/2023 07:28   CT Angio Abd/Pel w/ and/or w/o  Result Date: 01/14/2023 CLINICAL DATA:  77 year old male with history of prostatic hematuria. EXAM: CT ANGIOGRAPHY ABDOMEN AND PELVIS WITH CONTRAST AND WITHOUT CONTRAST TECHNIQUE: Multidetector CT imaging of the abdomen and pelvis was  performed using the standard protocol during bolus administration of intravenous contrast. Multiplanar reconstructed images and MIPs were obtained and reviewed to evaluate the vascular anatomy. RADIATION DOSE REDUCTION: This exam was performed according to the departmental dose-optimization program which includes automated exposure control, adjustment of the mA and/or kV according to patient size and/or use of iterative reconstruction technique. CONTRAST:  OMNIPAQUE IOHEXOL 300 MG/ML  SOLN COMPARISON:  06/21/2022, 01/07/2023 FINDINGS: VASCULAR Aorta: Normal caliber aorta without aneurysm, dissection, vasculitis or significant stenosis. Scattered atherosclerotic calcifications. Celiac: Patent without evidence of aneurysm, dissection, vasculitis or significant stenosis. SMA: Patent without evidence of aneurysm, dissection, vasculitis or significant stenosis. Renals: Single bilateral renal arteries are patent without evidence of aneurysm, dissection, vasculitis, fibromuscular dysplasia or significant stenosis. IMA: Patent without evidence of aneurysm, dissection, vasculitis or significant stenosis. Inflow: Patent without evidence of aneurysm, dissection, vasculitis or significant stenosis. Proximal Outflow: Bilateral common femoral and visualized portions of the superficial and profunda femoral arteries are patent without evidence of aneurysm, dissection, vasculitis or significant stenosis. Bilateral Yamaki Group A internal iliac artery branch types. The right prostatic artery arises from the proximal internal pudendal artery. The left prostatic artery arises from the proximal obturator artery. Veins: The hepatic veins are widely patent. The portal system is widely patent and normal in caliber. The renal veins are patent bilaterally in standard anatomic configuration. No evidence of iliocaval thrombosis or anomaly. Review of the MIP images confirms the above findings. NON-VASCULAR Lower chest: Bibasilar  subsegmental atelectasis, slightly progressed from comparison. Hepatobiliary: No new focal liver abnormality is seen. Similar appearing multifocal bilobar simple hepatic cysts, the largest in the left lobe measuring up to 2.6 cm. No gallstones, gallbladder wall thickening, or biliary dilatation. Pancreas: Unremarkable. No pancreatic ductal dilatation or surrounding inflammatory changes. Spleen: Normal in size without focal abnormality. Adrenals/Urinary Tract: Adrenal glands are unremarkable. Unchanged scattered simple renal cysts, none requiring additional follow-up. Kidneys are normal, without renal calculi, focal lesion, or hydronephrosis. Balloon retention suprapubic and trans urethral Foley type catheter is in place. The bladder is completely decompressed. Stomach/Bowel: Stomach is within normal limits. Appendix appears surgically absent. No evidence of bowel wall thickening, distention, or inflammatory changes. Lymphatic: No abdominopelvic lymphadenopathy. Reproductive: Prostate gland is enlarged measuring proximally 6.5 x 5.9 x 9.9 cm with an estimated volume of 199 g. Other: Small fat containing inguinal hernias bilaterally. No abdominopelvic collections. Musculoskeletal: No acute osseous abnormality. Mild multilevel degenerative changes of the visualized thoracolumbar spine. IMPRESSION: VASCULAR 1. No acute vascular abnormality in the abdomen or pelvis. 2. [place] type IV right prostatic artery and type II left prostatic artery. Bilateral [place] Group A internal iliac artery branching configuration. 3.  Aortic Atherosclerosis (ICD10-I70.0). NON-VASCULAR 1. Slightly worsened bibasilar subsegmental atelectasis. 2. Prostatomegaly with estimated volume of 199 g. 3. Decompressed bladder with indwelling suprapubic and Foley catheters. Marliss Coots, MD [facility] Radiology Electronically Signed   By: Marliss Coots M.D.   On: 01/14/2023 15:19   DG Chest 2  View  Result Date: 01/14/2023 CLINICAL DATA:  Dyspnea EXAM: CHEST - 2 VIEW COMPARISON:  01/07/2023 FINDINGS: Cardiomegaly. Mild, diffuse bilateral interstitial pulmonary opacity. Disc degenerative disease of the thoracic spine. IMPRESSION: Cardiomegaly with mild, diffuse bilateral interstitial pulmonary opacity, consistent with edema or atypical/viral infection. No focal airspace opacity. Electronically Signed   By: Jearld Lesch M.D.   On: 01/14/2023 15:19   ECHOCARDIOGRAM COMPLETE  Result Date: 01/08/2023    ECHOCARDIOGRAM REPORT   Patient Name:   LORENZO ARSCOTT Date of Exam: 01/08/2023 Medical Rec #:  [id number]      Height:       72.0 in Accession #:    [id number]     Weight:       252.0 lb Date of Birth:  03-Oct-1945      BSA:          2.350 m Patient Age:    77 years       BP:           155/93 mmHg Patient Gender: M              HR:           102 bpm. Exam Location:  Inpatient Procedure: 2D Echo, Color Doppler, Cardiac Doppler and Intracardiac            Opacification Agent Indications:    Elevated BNP  History:        Patient has prior history of Echocardiogram examinations, most                 recent 05/28/2022. Stroke and h/o ETOH abuse; Risk                 Factors:Hypertension and Dyslipidemia.  Sonographer:    Milbert Coulter Referring Phys: [id number] RATHORE  Sonographer Comments: Technically difficult study due to poor echo windows, suboptimal parasternal window, suboptimal apical window and patient is obese. Image acquisition challenging due to patient body habitus and Image acquisition challenging due to respiratory motion. IMPRESSIONS  1. Left ventricular ejection fraction, by estimation, is 60 to 65%. The left ventricle has normal function. The left ventricle has no regional wall motion abnormalities. There is mild concentric left ventricular hypertrophy. Left ventricular diastolic parameters are indeterminate.  2. Right ventricular systolic function is normal. The right ventricular size  is normal.  3. The mitral valve is grossly normal. No evidence of mitral valve regurgitation. No evidence of mitral stenosis.  4. The aortic valve was not well visualized. Aortic valve regurgitation is not visualized. No aortic stenosis is present.  5. There is borderline dilatation of the ascending aorta, measuring 38 mm.  6. The inferior vena cava is normal in size with greater than 50% respiratory variability, suggesting right atrial pressure of 3 mmHg. Comparison(s): No significant change from prior study. Prior images reviewed side by side. Poor quality images, but no overt regional wall motion abnormalities are seen. Incessant ectopy limits evaluation of mitral inflow/diastolic parameters. FINDINGS  Left Ventricle: Left ventricular ejection fraction, by estimation, is 60 to 65%. The left ventricle has normal function. The left ventricle has no regional wall motion abnormalities. Definity contrast agent  was given IV to delineate the left ventricular  endocardial borders. The left ventricular internal cavity size was normal in size. There is mild concentric left ventricular hypertrophy. Left ventricular diastolic parameters are indeterminate. Right Ventricle: The right ventricular size is normal. No increase in right ventricular wall thickness. Right ventricular systolic function is normal. Left Atrium: Left atrial size was normal in size. Right Atrium: Right atrial size was normal in size. Pericardium: There is no evidence of pericardial effusion. Mitral Valve: The mitral valve is grossly normal. No evidence of mitral valve regurgitation. No evidence of mitral valve stenosis. Tricuspid Valve: The tricuspid valve is not well visualized. Tricuspid valve regurgitation is not demonstrated. No evidence of tricuspid stenosis. Aortic Valve: The aortic valve was not well visualized. Aortic valve regurgitation is not visualized. No aortic stenosis is present. Aortic valve mean gradient measures 5.0 mmHg. Aortic valve  peak gradient measures 8.8 mmHg. Aortic valve area, by VTI measures 2.30 cm. Pulmonic Valve: The pulmonic valve was not well visualized. Pulmonic valve regurgitation is not visualized. No evidence of pulmonic stenosis. Aorta: The aortic root is normal in size and structure. There is borderline dilatation of the ascending aorta, measuring 38 mm. Venous: The inferior vena cava is normal in size with greater than 50% respiratory variability, suggesting right atrial pressure of 3 mmHg. IAS/Shunts: No atrial level shunt detected by color flow Doppler.  LEFT VENTRICLE PLAX 2D LVIDd:         5.10 cm LVIDs:         3.10 cm LV PW:         1.40 cm LV IVS:        1.40 cm LVOT diam:     2.10 cm LV SV:         60 LV SV Index:   25 LVOT Area:     3.46 cm  RIGHT VENTRICLE RV S prime:     22.00 cm/s TAPSE (M-mode): 2.0 cm LEFT ATRIUM             Index        RIGHT ATRIUM           Index LA Vol (A2C):   65.4 ml 27.82 ml/m  RA Area:     17.80 cm LA Vol (A4C):   70.4 ml 29.95 ml/m  RA Volume:   46.80 ml  19.91 ml/m LA Biplane Vol: 73.9 ml 31.44 ml/m  AORTIC VALVE AV Area (Vmax):    2.22 cm AV Area (Vmean):   2.20 cm AV Area (VTI):     2.30 cm AV Vmax:           148.00 cm/s AV Vmean:          106.000 cm/s AV VTI:            0.259 m AV Peak Grad:      8.8 mmHg AV Mean Grad:      5.0 mmHg LVOT Vmax:         94.70 cm/s LVOT Vmean:        67.300 cm/s LVOT VTI:          0.172 m LVOT/AV VTI ratio: 0.66  AORTA Ao Asc diam: 3.80 cm  SHUNTS Systemic VTI:  0.17 m Systemic Diam: 2.10 cm Thurmon Fair MD Electronically signed by Thurmon Fair MD Signature Date/Time: 01/08/2023/11:09:39 AM    Final    CT Angio Chest PE W and/or Wo Contrast  Result Date: 01/07/2023 CLINICAL DATA:  Shortness of breath  EXAM: CT ANGIOGRAPHY CHEST WITH CONTRAST TECHNIQUE: Multidetector CT imaging of the chest was performed using the standard protocol during bolus administration of intravenous contrast. Multiplanar CT image reconstructions and MIPs were  obtained to evaluate the vascular anatomy. RADIATION DOSE REDUCTION: This exam was performed according to the departmental dose-optimization program which includes automated exposure control, adjustment of the mA and/or kV according to patient size and/or use of iterative reconstruction technique. CONTRAST:  80mL OMNIPAQUE IOHEXOL 350 MG/ML SOLN COMPARISON:  Chest x-ray from earlier in the same day. FINDINGS: Cardiovascular: Atherosclerotic calcifications of the thoracic aorta are noted. No aneurysmal dilatation is noted. Coronary calcifications are seen. The pulmonary artery shows a normal branching pattern bilaterally. No filling defect to suggest pulmonary embolism is noted. Mediastinum/Nodes: Thoracic inlet is within normal limits. No hilar or mediastinal adenopathy is noted. The esophagus as visualized is within normal limits. Lungs/Pleura: Mild bibasilar atelectatic changes are noted. No focal nodular changes are seen. No focal confluent infiltrate is noted. Upper Abdomen: Visualized upper abdomen shows scattered hypodensities throughout the liver similar to that seen on the prior exam most consistent with cysts. Stable right adrenal adenoma is noted similar to that seen on the prior exam Musculoskeletal: Degenerative changes of the thoracic spine are noted. No acute abnormality noted. Review of the MIP images confirms the above findings. IMPRESSION: No evidence of pulmonary emboli. Mild bibasilar atelectatic changes. Stable cysts within the liver. Right adrenal lesion is noted considered a long-term stable adrenal adenoma over multiple previous exams. Aortic Atherosclerosis (ICD10-I70.0). Electronically Signed   By: Alcide Clever M.D.   On: 01/07/2023 23:52   DG Chest Portable 1 View  Result Date: 01/07/2023 CLINICAL DATA:  Fever EXAM: PORTABLE CHEST 1 VIEW COMPARISON:  01/05/2023 FINDINGS: Cardiomegaly. Mediastinal contours within normal limits. Vascular congestion. Low lung volumes with left base  atelectasis. No overt edema or effusions. No acute bony abnormality. IMPRESSION: Low lung volumes with left base atelectasis. Cardiomegaly, vascular congestion. Electronically Signed   By: Charlett Nose M.D.   On: 01/07/2023 19:30   DG Chest Port 1 View  Result Date: 01/05/2023 CLINICAL DATA:  afib EXAM: PORTABLE CHEST 1 VIEW COMPARISON:  CXR 05/27/22 FINDINGS: Low lung volumes. No pleural effusion. No pneumothorax. Likely unchanged cardiac and mediastinal contours when accounting for differences in lung volume. Focal airspace opacity. There are prominent bilateral interstitial opacities could represent pulmonary venous congestion. No radiographically apparent displaced rib fractures. Visualized upper abdomen is unremarkable. IMPRESSION: Prominent bilateral interstitial opacities could represent pulmonary venous congestion. Electronically Signed   By: Lorenza Cambridge M.D.   On: 01/05/2023 15:19   DG C-Arm 1-60 Min-No Report  Result Date: 12/25/2022 Fluoroscopy was utilized by the requesting physician.  No radiographic interpretation.     Time coordinating discharge: Over 30 minutes    Lewie Chamber, MD  [facility] 01/19/2023, 10:28 AM

## 2023-01-21 DIAGNOSIS — I482 Chronic atrial fibrillation, unspecified: Secondary | ICD-10-CM | POA: Diagnosis not present

## 2023-01-21 DIAGNOSIS — N320 Bladder-neck obstruction: Secondary | ICD-10-CM | POA: Diagnosis not present

## 2023-01-21 DIAGNOSIS — I10 Essential (primary) hypertension: Secondary | ICD-10-CM | POA: Diagnosis not present

## 2023-01-21 DIAGNOSIS — N400 Enlarged prostate without lower urinary tract symptoms: Secondary | ICD-10-CM | POA: Diagnosis not present

## 2023-01-21 DIAGNOSIS — I5032 Chronic diastolic (congestive) heart failure: Secondary | ICD-10-CM | POA: Diagnosis not present

## 2023-01-21 DIAGNOSIS — F1010 Alcohol abuse, uncomplicated: Secondary | ICD-10-CM | POA: Diagnosis not present

## 2023-01-21 DIAGNOSIS — R5381 Other malaise: Secondary | ICD-10-CM | POA: Diagnosis not present

## 2023-01-22 DIAGNOSIS — Z7189 Other specified counseling: Secondary | ICD-10-CM | POA: Diagnosis not present

## 2023-01-22 DIAGNOSIS — F331 Major depressive disorder, recurrent, moderate: Secondary | ICD-10-CM | POA: Diagnosis not present

## 2023-01-22 DIAGNOSIS — N368 Other specified disorders of urethra: Secondary | ICD-10-CM | POA: Diagnosis not present

## 2023-01-22 DIAGNOSIS — Z9359 Other cystostomy status: Secondary | ICD-10-CM | POA: Diagnosis not present

## 2023-01-22 DIAGNOSIS — F411 Generalized anxiety disorder: Secondary | ICD-10-CM | POA: Diagnosis not present

## 2023-01-22 NOTE — Telephone Encounter (Signed)
PFT can't be done while he is in hospital.  He needs to be in a chronic, stable condition to get baseline pulmonary function status.

## 2023-01-22 NOTE — Telephone Encounter (Signed)
Called and spoke with patient's daughter Erie Noe. She verbalized understanding. Confirmed that he is scheduled for the PFT on 03/27/23 at 2pm.   Nothing further needed at time of call.

## 2023-01-26 DIAGNOSIS — Z9181 History of falling: Secondary | ICD-10-CM | POA: Diagnosis not present

## 2023-01-26 DIAGNOSIS — S82832S Other fracture of upper and lower end of left fibula, sequela: Secondary | ICD-10-CM | POA: Diagnosis not present

## 2023-01-26 DIAGNOSIS — M6281 Muscle weakness (generalized): Secondary | ICD-10-CM | POA: Diagnosis not present

## 2023-01-26 DIAGNOSIS — R296 Repeated falls: Secondary | ICD-10-CM | POA: Diagnosis not present

## 2023-01-26 DIAGNOSIS — E119 Type 2 diabetes mellitus without complications: Secondary | ICD-10-CM | POA: Diagnosis not present

## 2023-01-26 DIAGNOSIS — R2681 Unsteadiness on feet: Secondary | ICD-10-CM | POA: Diagnosis not present

## 2023-01-26 DIAGNOSIS — R262 Difficulty in walking, not elsewhere classified: Secondary | ICD-10-CM | POA: Diagnosis not present

## 2023-01-28 ENCOUNTER — Ambulatory Visit (HOSPITAL_COMMUNITY)
Admission: RE | Admit: 2023-01-28 | Discharge: 2023-01-28 | Disposition: A | Discharge status: Home / Self Care | Payer: Medicare HMO | Source: Ambulatory Visit | Attending: Internal Medicine | Admitting: Internal Medicine

## 2023-01-28 ENCOUNTER — Encounter (HOSPITAL_COMMUNITY): Payer: Self-pay | Admitting: Internal Medicine

## 2023-01-28 VITALS — BP 128/74 | HR 87 | Ht 72.0 in | Wt 248.0 lb

## 2023-01-28 DIAGNOSIS — R9431 Abnormal electrocardiogram [ECG] [EKG]: Secondary | ICD-10-CM | POA: Diagnosis not present

## 2023-01-28 DIAGNOSIS — I10 Essential (primary) hypertension: Secondary | ICD-10-CM | POA: Diagnosis not present

## 2023-01-28 DIAGNOSIS — Z8673 Personal history of transient ischemic attack (TIA), and cerebral infarction without residual deficits: Secondary | ICD-10-CM | POA: Insufficient documentation

## 2023-01-28 DIAGNOSIS — D6869 Other thrombophilia: Secondary | ICD-10-CM | POA: Insufficient documentation

## 2023-01-28 DIAGNOSIS — I480 Paroxysmal atrial fibrillation: Secondary | ICD-10-CM | POA: Insufficient documentation

## 2023-01-28 DIAGNOSIS — I4891 Unspecified atrial fibrillation: Secondary | ICD-10-CM | POA: Diagnosis not present

## 2023-01-28 DIAGNOSIS — N400 Enlarged prostate without lower urinary tract symptoms: Secondary | ICD-10-CM | POA: Diagnosis not present

## 2023-01-28 DIAGNOSIS — I482 Chronic atrial fibrillation, unspecified: Secondary | ICD-10-CM | POA: Diagnosis not present

## 2023-01-28 DIAGNOSIS — Z7901 Long term (current) use of anticoagulants: Secondary | ICD-10-CM | POA: Diagnosis not present

## 2023-01-28 DIAGNOSIS — M62838 Other muscle spasm: Secondary | ICD-10-CM | POA: Diagnosis not present

## 2023-01-28 DIAGNOSIS — F418 Other specified anxiety disorders: Secondary | ICD-10-CM | POA: Diagnosis not present

## 2023-01-28 DIAGNOSIS — I5032 Chronic diastolic (congestive) heart failure: Secondary | ICD-10-CM | POA: Diagnosis not present

## 2023-01-28 DIAGNOSIS — E119 Type 2 diabetes mellitus without complications: Secondary | ICD-10-CM | POA: Diagnosis not present

## 2023-01-28 DIAGNOSIS — F1010 Alcohol abuse, uncomplicated: Secondary | ICD-10-CM | POA: Diagnosis not present

## 2023-01-28 DIAGNOSIS — R5381 Other malaise: Secondary | ICD-10-CM | POA: Diagnosis not present

## 2023-01-28 NOTE — Patient Instructions (Addendum)
Continue Cardizem 180 mg once daily  Continue Xarelto 20 mg once daily   Follow up in 1 month.  Kardiamobile device

## 2023-01-28 NOTE — Progress Notes (Signed)
Primary Care Physician: [name], MD Primary Cardiologist: Lance Muss, MD Electrophysiologist: None     Referring Physician: Dr. [name] is a 77 y.o. male with a history of CVA in 2023, BPH, HTN, neurogenic bladder s/p suprapubic catheter, and paroxysmal atrial fibrillation who presents for consultation in the [facility]. Seen by Dr. Ladona Ridgel for consultation during recent hospital admission 7/20-21 for catheter obstruction found to be in new onset Afib with RVR. Started on Cardizem 180 mg daily. He was again admitted for catheter issues and s/p bilateral prostate artery embolization and coil embolization of right inferior rectal artery on 7/31 with Xarelto resumed on 8/2. Patient is on Xarelto 20 mg daily for a CHADS2VASC score of 5.  On evaluation today, he is currently in NSR. He is currently in a rehab facility and will be there for about one more week. He doesn't recall any specific symptoms with regards to when he was in Afib with RVR. He has not missed any doses of Cardizem or Xarelto. He does not have any bleeding issues on Xarelto.  Today, he denies symptoms of palpitations, chest pain, shortness of breath, orthopnea, PND, lower extremity edema, dizziness, presyncope, syncope, snoring, daytime somnolence, bleeding, or neurologic sequela. The patient is tolerating medications without difficulties and is otherwise without complaint today.    Atrial Fibrillation Risk Factors:  he does not have symptoms or diagnosis of sleep apnea.  he has a BMI of Body mass index is 33.63 kg/m.Marland Kitchen Filed Weights   01/28/23 1310  Weight: 112.5 kg    Current Outpatient Medications  Medication Sig Dispense Refill   atorvastatin (LIPITOR) 20 MG tablet Take 20 mg by mouth daily.     cyanocobalamin (VITAMIN B12) 1000 MCG/ML injection INJECT 1 ML (1,000 MCG TOTAL) INTO THE MUSCLE ONCE A WEEK. 12 mL 1   cyclobenzaprine (FLEXERIL) 10 MG tablet  Take 1 tablet (10 mg total) by mouth 3 (three) times daily as needed for muscle spasms. 90 tablet 5   diltiazem (CARDIZEM CD) 180 MG 24 hr capsule Take 1 capsule (180 mg total) by mouth daily. 30 capsule 3   docusate sodium (COLACE) 100 MG capsule Take 100 mg by mouth 2 (two) times daily.     finasteride (PROSCAR) 5 MG tablet TAKE 1 TABLET (5 MG TOTAL) BY MOUTH DAILY. 90 tablet 1   fluticasone (FLONASE) 50 MCG/ACT nasal spray Place 1 spray into both nostrils daily. 16 g 11   folic acid (FOLVITE) 1 MG tablet Take 1 tablet (1 mg total) by mouth daily. 90 tablet 3   furosemide (LASIX) 20 MG tablet Take 1 tablet (20 mg total) by mouth daily. 90 tablet 3   KLOR-CON M10 10 MEQ tablet Take 1 tablet (10 mEq total) by mouth 2 (two) times daily. 180 tablet 3   loperamide (IMODIUM) 2 MG capsule Take 1 capsule (2 mg total) by mouth every 8 (eight) hours as needed for diarrhea or loose stools. 60 capsule 5   Multiple Vitamin (MULTIVITAMIN WITH MINERALS) TABS tablet Take 1 tablet by mouth daily. 30 tablet 0   ondansetron (ZOFRAN) 4 MG tablet Take 1 tablet (4 mg total) by mouth every 8 (eight) hours as needed for nausea or vomiting. 90 tablet 5   rivaroxaban (XARELTO) 20 MG TABS tablet Take 1 tablet (20 mg total) by mouth daily. 90 tablet 2   sertraline (ZOLOFT) 100 MG tablet TAKE 1 TABLET BY MOUTH EVERY DAY  90 tablet 0   SYRINGE-NEEDLE, DISP, 3 ML (BD ECLIPSE SYRINGE/NEEDLE) 25G X 5/8" 3 ML MISC Use as directed once a week 12 each 3   tamsulosin (FLOMAX) 0.4 MG CAPS capsule TAKE 1 CAPSULE BY MOUTH EVERY DAY 90 capsule 3   thiamine (VITAMIN B-1) 100 MG tablet Take 1 tablet (100 mg total) by mouth daily. 90 tablet 3   No current facility-administered medications for this encounter.    Atrial Fibrillation Management history:  Previous antiarrhythmic drugs: None Previous cardioversions: None Previous ablations: None Anticoagulation history: Xarelto  ROS- All systems are reviewed and negative except as per  the HPI above.  Physical Exam: BP 128/74   Pulse 87   Ht 6' (1.829 m)   Wt 112.5 kg   BMI 33.63 kg/m   GEN: Well nourished, well developed in no acute distress NECK: No JVD; No carotid bruits CARDIAC: Regular rate and rhythm, no murmurs, rubs, gallops RESPIRATORY:  Clear to auscultation without rales, wheezing or rhonchi  ABDOMEN: Soft, non-tender, non-distended EXTREMITIES:  No edema; No deformity   EKG today demonstrates  Vent. rate 87 BPM PR interval 150 ms QRS duration 84 ms QT/QTcB 362/435 ms P-R-T axes 30 -1 110 Normal sinus rhythm Cannot rule out Inferior infarct , age undetermined T wave abnormality, consider lateral ischemia Abnormal ECG When compared with ECG of 08-Jan-2023 05:50, PREVIOUS ECG IS PRESENT  Echo 01/08/23 demonstrated   1. Left ventricular ejection fraction, by estimation, is 60 to 65%. The  left ventricle has normal function. The left ventricle has no regional  wall motion abnormalities. There is mild concentric left ventricular  hypertrophy. Left ventricular diastolic  parameters are indeterminate.   2. Right ventricular systolic function is normal. The right ventricular  size is normal.   3. The mitral valve is grossly normal. No evidence of mitral valve  regurgitation. No evidence of mitral stenosis.   4. The aortic valve was not well visualized. Aortic valve regurgitation  is not visualized. No aortic stenosis is present.   5. There is borderline dilatation of the ascending aorta, measuring 38  mm.   6. The inferior vena cava is normal in size with greater than 50%  respiratory variability, suggesting right atrial pressure of 3 mmHg.    ASSESSMENT & PLAN CHA2DS2-VASc Score = 5  The patient's score is based upon: CHF History: 0 HTN History: 1 Diabetes History: 0 Stroke History: 2 Vascular Disease History: 0 Age Score: 2 Gender Score: 0       ASSESSMENT AND PLAN: Paroxysmal Atrial Fibrillation (ICD10:  I48.0) The patient's  CHA2DS2-VASc score is 5, indicating a 7.2% annual risk of stroke.    He is currently in NSR.  Education provided about Afib. Discussion about medication treatments and ablation going forward if indicated. After discussion, we will proceed with conservative observation at this time. Rhythm monitoring device recommended.   Secondary Hypercoagulable State (ICD10:  D68.69) The patient is at significant risk for stroke/thromboembolism based upon his CHA2DS2-VASc Score of 5.  Continue Rivaroxaban (Xarelto).  No missed doses. Will draw CBC at next OV.   Follow up 1 month to assess burden.   Lake Bells, PA-C  [facility] [address] [place], [place] [address] [phone]

## 2023-01-29 DIAGNOSIS — R310 Gross hematuria: Secondary | ICD-10-CM | POA: Diagnosis not present

## 2023-01-30 DIAGNOSIS — F419 Anxiety disorder, unspecified: Secondary | ICD-10-CM | POA: Diagnosis not present

## 2023-01-30 DIAGNOSIS — F1020 Alcohol dependence, uncomplicated: Secondary | ICD-10-CM | POA: Diagnosis not present

## 2023-01-30 DIAGNOSIS — R319 Hematuria, unspecified: Secondary | ICD-10-CM | POA: Diagnosis not present

## 2023-01-30 DIAGNOSIS — G4700 Insomnia, unspecified: Secondary | ICD-10-CM | POA: Diagnosis not present

## 2023-01-30 DIAGNOSIS — Z131 Encounter for screening for diabetes mellitus: Secondary | ICD-10-CM | POA: Diagnosis not present

## 2023-01-30 DIAGNOSIS — F32A Depression, unspecified: Secondary | ICD-10-CM | POA: Diagnosis not present

## 2023-02-02 DIAGNOSIS — L089 Local infection of the skin and subcutaneous tissue, unspecified: Secondary | ICD-10-CM | POA: Diagnosis not present

## 2023-02-07 DIAGNOSIS — N390 Urinary tract infection, site not specified: Secondary | ICD-10-CM | POA: Diagnosis not present

## 2023-02-08 DIAGNOSIS — R319 Hematuria, unspecified: Secondary | ICD-10-CM | POA: Diagnosis not present

## 2023-02-08 DIAGNOSIS — Z9359 Other cystostomy status: Secondary | ICD-10-CM | POA: Diagnosis not present

## 2023-02-11 DIAGNOSIS — I10 Essential (primary) hypertension: Secondary | ICD-10-CM | POA: Diagnosis not present

## 2023-02-12 DIAGNOSIS — G4700 Insomnia, unspecified: Secondary | ICD-10-CM | POA: Diagnosis not present

## 2023-02-12 DIAGNOSIS — F1020 Alcohol dependence, uncomplicated: Secondary | ICD-10-CM | POA: Diagnosis not present

## 2023-02-12 DIAGNOSIS — F419 Anxiety disorder, unspecified: Secondary | ICD-10-CM | POA: Diagnosis not present

## 2023-02-12 DIAGNOSIS — K5900 Constipation, unspecified: Secondary | ICD-10-CM | POA: Diagnosis not present

## 2023-02-12 DIAGNOSIS — F32A Depression, unspecified: Secondary | ICD-10-CM | POA: Diagnosis not present

## 2023-02-12 DIAGNOSIS — N390 Urinary tract infection, site not specified: Secondary | ICD-10-CM | POA: Diagnosis not present

## 2023-02-14 DIAGNOSIS — N312 Flaccid neuropathic bladder, not elsewhere classified: Secondary | ICD-10-CM | POA: Diagnosis not present

## 2023-02-20 DIAGNOSIS — N320 Bladder-neck obstruction: Secondary | ICD-10-CM | POA: Diagnosis not present

## 2023-02-20 DIAGNOSIS — I10 Essential (primary) hypertension: Secondary | ICD-10-CM | POA: Diagnosis not present

## 2023-02-20 DIAGNOSIS — I480 Paroxysmal atrial fibrillation: Secondary | ICD-10-CM | POA: Diagnosis not present

## 2023-02-20 DIAGNOSIS — N400 Enlarged prostate without lower urinary tract symptoms: Secondary | ICD-10-CM | POA: Diagnosis not present

## 2023-02-25 DIAGNOSIS — I10 Essential (primary) hypertension: Secondary | ICD-10-CM | POA: Diagnosis not present

## 2023-02-25 DIAGNOSIS — E119 Type 2 diabetes mellitus without complications: Secondary | ICD-10-CM | POA: Diagnosis not present

## 2023-02-26 DIAGNOSIS — F419 Anxiety disorder, unspecified: Secondary | ICD-10-CM | POA: Diagnosis not present

## 2023-02-26 DIAGNOSIS — E538 Deficiency of other specified B group vitamins: Secondary | ICD-10-CM | POA: Diagnosis not present

## 2023-02-26 DIAGNOSIS — F1020 Alcohol dependence, uncomplicated: Secondary | ICD-10-CM | POA: Diagnosis not present

## 2023-02-26 DIAGNOSIS — E785 Hyperlipidemia, unspecified: Secondary | ICD-10-CM | POA: Diagnosis not present

## 2023-02-26 DIAGNOSIS — G4700 Insomnia, unspecified: Secondary | ICD-10-CM | POA: Diagnosis not present

## 2023-02-26 DIAGNOSIS — F32A Depression, unspecified: Secondary | ICD-10-CM | POA: Diagnosis not present

## 2023-02-27 ENCOUNTER — Ambulatory Visit (HOSPITAL_COMMUNITY): Payer: Medicare HMO | Admitting: Internal Medicine

## 2023-02-28 ENCOUNTER — Inpatient Hospital Stay (HOSPITAL_COMMUNITY)
Admission: RE | Admit: 2023-02-28 | Discharge: 2023-02-28 | Disposition: A | Discharge status: Home / Self Care | Payer: Medicare HMO | Source: Ambulatory Visit | Attending: Internal Medicine | Admitting: Internal Medicine

## 2023-02-28 ENCOUNTER — Ambulatory Visit (HOSPITAL_COMMUNITY)
Admission: RE | Admit: 2023-02-28 | Discharge: 2023-02-28 | Disposition: A | Discharge status: Home / Self Care | Payer: Medicare HMO | Source: Ambulatory Visit | Attending: Internal Medicine | Admitting: Internal Medicine

## 2023-02-28 VITALS — BP 160/90 | HR 99 | Ht 72.0 in | Wt 255.2 lb

## 2023-02-28 DIAGNOSIS — I480 Paroxysmal atrial fibrillation: Secondary | ICD-10-CM

## 2023-02-28 DIAGNOSIS — Z8673 Personal history of transient ischemic attack (TIA), and cerebral infarction without residual deficits: Secondary | ICD-10-CM | POA: Insufficient documentation

## 2023-02-28 DIAGNOSIS — I498 Other specified cardiac arrhythmias: Secondary | ICD-10-CM | POA: Diagnosis not present

## 2023-02-28 DIAGNOSIS — D6869 Other thrombophilia: Secondary | ICD-10-CM | POA: Diagnosis not present

## 2023-02-28 DIAGNOSIS — I10 Essential (primary) hypertension: Secondary | ICD-10-CM | POA: Diagnosis not present

## 2023-02-28 DIAGNOSIS — Z7901 Long term (current) use of anticoagulants: Secondary | ICD-10-CM | POA: Insufficient documentation

## 2023-02-28 DIAGNOSIS — I4891 Unspecified atrial fibrillation: Secondary | ICD-10-CM

## 2023-02-28 NOTE — Progress Notes (Signed)
Primary Care Physician: Calvin Salisbury, MD Primary Cardiologist: Calvin Muss, MD Electrophysiologist: None     Referring Physician: Dr. Marinell [name] is a 77 y.o. male with a history of CVA in 2023, BPH, HTN, neurogenic bladder s/p suprapubic catheter, and paroxysmal atrial fibrillation who presents for consultation in the [facility]. Seen by Dr. Ladona [name] for consultation during recent hospital admission 7/20-21 for catheter obstruction found to be in new onset Afib with RVR. Started on Cardizem 180 mg daily. He was again admitted for catheter issues and s/p bilateral prostate artery embolization and coil embolization of right inferior rectal artery on 7/31 with Xarelto resumed on 8/2. Patient is on Xarelto 20 mg daily for a CHADS2VASC score of 5.  On evaluation today, he is currently in NSR. He is currently in a rehab facility and will be there for about one more week. He doesn't recall any specific symptoms with regards to when he was in Afib with RVR. He has not missed any doses of Cardizem or Xarelto. He does not have any bleeding issues on Xarelto.  On follow up 02/28/23, he is currently in NSR. Since last OV, he admits to not getting rhythm monitoring device. He does not appear to have cardiac awareness when in Afib. He is currently on diltiazem 180 mg daily. He does not have any bleeding issues on Xarelto. He is still in facility but will be discharged in 2 days. He is ultimately trying to move to [place] to be closer to daughter.   Today, he denies symptoms of palpitations, chest pain, shortness of breath, orthopnea, PND, lower extremity edema, dizziness, presyncope, syncope, snoring, daytime somnolence, bleeding, or neurologic sequela. The patient is tolerating medications without difficulties and is otherwise without complaint today.    Atrial Fibrillation Risk Factors:  he does not have symptoms or diagnosis of sleep apnea.  he has a  BMI of Body mass index is 34.61 kg/m.Marland Kitchen Filed Weights   02/28/23 1333  Weight: 115.8 kg     Current Outpatient Medications  Medication Sig Dispense Refill   atorvastatin (LIPITOR) 20 MG tablet Take 20 mg by mouth daily.     cyanocobalamin (VITAMIN B12) 1000 MCG/ML injection INJECT 1 ML (1,000 MCG TOTAL) INTO THE MUSCLE ONCE A WEEK. 12 mL 1   cyclobenzaprine (FLEXERIL) 10 MG tablet Take 1 tablet (10 mg total) by mouth 3 (three) times daily as needed for muscle spasms. 90 tablet 5   diltiazem (CARDIZEM CD) 180 MG 24 hr capsule Take 1 capsule (180 mg total) by mouth daily. 30 capsule 3   docusate sodium (COLACE) 100 MG capsule Take 100 mg by mouth every 12 (twelve) hours as needed.     finasteride (PROSCAR) 5 MG tablet TAKE 1 TABLET (5 MG TOTAL) BY MOUTH DAILY. 90 tablet 1   fluticasone (FLONASE) 50 MCG/ACT nasal spray Place 1 spray into both nostrils daily. 16 g 11   folic acid (FOLVITE) 1 MG tablet Take 1 tablet (1 mg total) by mouth daily. 90 tablet 3   furosemide (LASIX) 20 MG tablet Take 1 tablet (20 mg total) by mouth daily. 90 tablet 3   hydrocortisone cream-nystatin cream-zinc oxide Apply topically. Not sure the name of the cream- Uses twice daily for wound care     KLOR-CON M10 10 MEQ tablet Take 1 tablet (10 mEq total) by mouth 2 (two) times daily. 180 tablet 3   loperamide (IMODIUM) 2 MG capsule Take  1 capsule (2 mg total) by mouth every 8 (eight) hours as needed for diarrhea or loose stools. 60 capsule 5   melatonin 3 MG TABS tablet Take 3 mg by mouth at bedtime.     Multiple Vitamin (MULTIVITAMIN WITH MINERALS) TABS tablet Take 1 tablet by mouth daily. 30 tablet 0   ondansetron (ZOFRAN) 4 MG tablet Take 1 tablet (4 mg total) by mouth every 8 (eight) hours as needed for nausea or vomiting. 90 tablet 5   rivaroxaban (XARELTO) 20 MG TABS tablet Take 1 tablet (20 mg total) by mouth daily. 90 tablet 2   sertraline (ZOLOFT) 100 MG tablet TAKE 1 TABLET BY MOUTH EVERY DAY 90 tablet 0    SYRINGE-NEEDLE, DISP, 3 ML (BD ECLIPSE SYRINGE/NEEDLE) 25G X 5/8" 3 ML MISC Use as directed once a week 12 each 3   thiamine (VITAMIN B-1) 100 MG tablet Take 1 tablet (100 mg total) by mouth daily. 90 tablet 3   tamsulosin (FLOMAX) 0.4 MG CAPS capsule TAKE 1 CAPSULE BY MOUTH EVERY DAY (Patient not taking: Reported on 02/28/2023) 90 capsule 3   No current facility-administered medications for this encounter.    Atrial Fibrillation Management history:  Previous antiarrhythmic drugs: None Previous cardioversions: None Previous ablations: None Anticoagulation history: Xarelto  ROS- All systems are reviewed and negative except as per the HPI above.  Physical Exam: BP (!) 160/90   Pulse 99   Ht 6' (1.829 m)   Wt 115.8 kg   BMI 34.61 kg/m   GEN- The patient is well appearing, alert and oriented x 3 today.   Neck - no JVD or carotid bruit noted Lungs- Clear to ausculation bilaterally, normal work of breathing Heart- Regular rate and rhythm, no murmurs, rubs or gallops, PMI not laterally displaced Extremities- no clubbing, cyanosis, or edema Skin - no rash or ecchymosis noted   EKG today demonstrates  Vent. rate 99 BPM PR interval 156 ms QRS duration 78 ms QT/QTcB 366/469 ms P-R-T axes 35 72 -43 Normal sinus rhythm with sinus arrhythmia Nonspecific T wave abnormality Abnormal ECG When compared with ECG of 28-Jan-2023 13:43, No significant change since last tracing Confirmed by Calvin [name] ([id number]) on 02/28/2023 2:14:22 PM  Echo 01/08/23 demonstrated   1. Left ventricular ejection fraction, by estimation, is 60 to 65%. The  left ventricle has normal function. The left ventricle has no regional  wall motion abnormalities. There is mild concentric left ventricular  hypertrophy. Left ventricular diastolic  parameters are indeterminate.   2. Right ventricular systolic function is normal. The right ventricular  size is normal.   3. The mitral valve is grossly normal. No evidence of  mitral valve  regurgitation. No evidence of mitral stenosis.   4. The aortic valve was not well visualized. Aortic valve regurgitation  is not visualized. No aortic stenosis is present.   5. There is borderline dilatation of the ascending aorta, measuring 38  mm.   6. The inferior vena cava is normal in size with greater than 50%  respiratory variability, suggesting right atrial pressure of 3 mmHg.    ASSESSMENT & PLAN CHA2DS2-VASc Score = 5  The patient's score is based upon: CHF History: 0 HTN History: 1 Diabetes History: 0 Stroke History: 2 Vascular Disease History: 0 Age Score: 2 Gender Score: 0       ASSESSMENT AND PLAN: Paroxysmal Atrial Fibrillation (ICD10:  I48.0) The patient's CHA2DS2-VASc score is 5, indicating a 7.2% annual risk of stroke.    He is  currently in NSR. Patient states he will get Kardiamobile device at some point. Since he has no cardiac awareness of Afib, I will place 2 week cardiac monitor to determine burden.  Secondary Hypercoagulable State (ICD10:  D68.69) The patient is at significant risk for stroke/thromboembolism based upon his CHA2DS2-VASc Score of 5.  Continue Rivaroxaban (Xarelto).  No missed doses.    Follow up 3 months Afib clinic.    [place] Bells, PA-C  Afib [facility] [address] [place], [place] [address] [phone]

## 2023-03-01 DIAGNOSIS — E785 Hyperlipidemia, unspecified: Secondary | ICD-10-CM | POA: Diagnosis not present

## 2023-03-01 DIAGNOSIS — I5032 Chronic diastolic (congestive) heart failure: Secondary | ICD-10-CM | POA: Diagnosis not present

## 2023-03-01 DIAGNOSIS — F418 Other specified anxiety disorders: Secondary | ICD-10-CM | POA: Diagnosis not present

## 2023-03-01 DIAGNOSIS — F1010 Alcohol abuse, uncomplicated: Secondary | ICD-10-CM | POA: Diagnosis not present

## 2023-03-01 DIAGNOSIS — I10 Essential (primary) hypertension: Secondary | ICD-10-CM | POA: Diagnosis not present

## 2023-03-01 DIAGNOSIS — N320 Bladder-neck obstruction: Secondary | ICD-10-CM | POA: Diagnosis not present

## 2023-03-01 DIAGNOSIS — E876 Hypokalemia: Secondary | ICD-10-CM | POA: Diagnosis not present

## 2023-03-01 DIAGNOSIS — I482 Chronic atrial fibrillation, unspecified: Secondary | ICD-10-CM | POA: Diagnosis not present

## 2023-03-03 ENCOUNTER — Other Ambulatory Visit: Payer: Self-pay | Admitting: Family Medicine

## 2023-03-04 ENCOUNTER — Emergency Department (HOSPITAL_COMMUNITY)
Admission: EM | Admit: 2023-03-04 | Discharge: 2023-03-04 | Disposition: A | Discharge status: Home / Self Care | Payer: Medicare HMO | Attending: Emergency Medicine | Admitting: Emergency Medicine

## 2023-03-04 ENCOUNTER — Other Ambulatory Visit: Payer: Self-pay

## 2023-03-04 DIAGNOSIS — Y69 Unspecified misadventure during surgical and medical care: Secondary | ICD-10-CM | POA: Insufficient documentation

## 2023-03-04 DIAGNOSIS — Z743 Need for continuous supervision: Secondary | ICD-10-CM | POA: Diagnosis not present

## 2023-03-04 DIAGNOSIS — T83098A Other mechanical complication of other indwelling urethral catheter, initial encounter: Secondary | ICD-10-CM | POA: Diagnosis not present

## 2023-03-04 DIAGNOSIS — Z79899 Other long term (current) drug therapy: Secondary | ICD-10-CM | POA: Diagnosis not present

## 2023-03-04 DIAGNOSIS — T83010A Breakdown (mechanical) of cystostomy catheter, initial encounter: Secondary | ICD-10-CM

## 2023-03-04 DIAGNOSIS — Z7901 Long term (current) use of anticoagulants: Secondary | ICD-10-CM | POA: Insufficient documentation

## 2023-03-04 DIAGNOSIS — I10 Essential (primary) hypertension: Secondary | ICD-10-CM | POA: Insufficient documentation

## 2023-03-04 DIAGNOSIS — T83091A Other mechanical complication of indwelling urethral catheter, initial encounter: Secondary | ICD-10-CM | POA: Diagnosis not present

## 2023-03-04 DIAGNOSIS — Z8616 Personal history of COVID-19: Secondary | ICD-10-CM | POA: Insufficient documentation

## 2023-03-04 LAB — CBC WITH DIFFERENTIAL/PLATELET
Abs Immature Granulocytes: 0.12 10*3/uL — ABNORMAL HIGH (ref 0.00–0.07)
Basophils Absolute: 0.1 10*3/uL (ref 0.0–0.1)
Basophils Relative: 1 %
Eosinophils Absolute: 0.3 10*3/uL (ref 0.0–0.5)
Eosinophils Relative: 2 %
HCT: 44.8 % (ref 39.0–52.0)
Hemoglobin: 15.5 g/dL (ref 13.0–17.0)
Immature Granulocytes: 1 %
Lymphocytes Relative: 13 %
Lymphs Abs: 2.2 10*3/uL (ref 0.7–4.0)
MCH: 32.6 pg (ref 26.0–34.0)
MCHC: 34.6 g/dL (ref 30.0–36.0)
MCV: 94.3 fL (ref 80.0–100.0)
Monocytes Absolute: 1.4 10*3/uL — ABNORMAL HIGH (ref 0.1–1.0)
Monocytes Relative: 8 %
Neutro Abs: 12.9 10*3/uL — ABNORMAL HIGH (ref 1.7–7.7)
Neutrophils Relative %: 75 %
Platelets: 325 10*3/uL (ref 150–400)
RBC: 4.75 MIL/uL (ref 4.22–5.81)
RDW: 12.4 % (ref 11.5–15.5)
WBC: 16.9 10*3/uL — ABNORMAL HIGH (ref 4.0–10.5)
nRBC: 0 % (ref 0.0–0.2)

## 2023-03-04 LAB — URINALYSIS, ROUTINE W REFLEX MICROSCOPIC
Bacteria, UA: NONE SEEN
Bilirubin Urine: NEGATIVE
Glucose, UA: NEGATIVE mg/dL
Ketones, ur: NEGATIVE mg/dL
Nitrite: NEGATIVE
Protein, ur: 100 mg/dL — AB
Specific Gravity, Urine: 1.012 (ref 1.005–1.030)
WBC, UA: >50 WBC/hpf (ref 0–5)
pH: 8 (ref 5.0–8.0)

## 2023-03-04 LAB — BASIC METABOLIC PANEL
Anion gap: 14 (ref 5–15)
BUN: 16 mg/dL (ref 8–23)
CO2: 20 mmol/L — ABNORMAL LOW (ref 22–32)
Calcium: 9.3 mg/dL (ref 8.9–10.3)
Chloride: 102 mmol/L (ref 98–111)
Creatinine, Ser: 0.85 mg/dL (ref 0.61–1.24)
GFR, Estimated: >60 mL/min (ref >60)
Glucose, Bld: 138 mg/dL — ABNORMAL HIGH (ref 70–99)
Potassium: 3.4 mmol/L — ABNORMAL LOW (ref 3.5–5.1)
Sodium: 136 mmol/L (ref 135–145)

## 2023-03-04 MED ORDER — AMOXICILLIN-POT CLAVULANATE 875-125 MG PO TABS: 1.0000 | ORAL_TABLET | Freq: Two times a day (BID) | ORAL | 0 refills | Status: DC

## 2023-03-04 MED ORDER — HYDROMORPHONE HCL 1 MG/ML IJ SOLN
0.5000 mg | Freq: Once | INTRAMUSCULAR | Status: AC
  Administered 2023-03-04: 0.5 mg via INTRAVENOUS
  Filled 2023-03-04: qty 1

## 2023-03-04 NOTE — Discharge Instructions (Signed)
We evaluated you for your suprapubic catheter malfunction.  We replaced your suprapubic catheter.  I have started you on antibiotics.  Please take these as prescribed.  If you have any new or worsening symptoms such as recurrent blockage of your catheter, abdominal pain, fevers or chills, nausea or vomiting, lightheadedness or dizziness, fainting, difficulty breathing, or any other new symptoms, please return to the emergency department for reassessment.

## 2023-03-04 NOTE — ED Provider Notes (Signed)
[place] EMERGENCY DEPARTMENT AT [facility] Provider Note  CSN: [id number] Arrival date & time: 03/04/23 4098  Chief Complaint(s) Indwelling Catheter Issue  HPI Calvin [name] is a 77 y.o. male history of prior stroke, neurogenic bladder with suprapubic catheter, hypertension presenting with urinary catheter issue.  Patient reports since last night, urinary catheter has not drained at all.  He reports lower abdominal pain.  Otherwise denies fevers or chills, nausea or vomiting, chest pain, shortness of breath, diarrhea.  Reports this is similar to previous episodes of it getting blocked.  Reports that this began around 3 AM.   Past Medical History Past Medical History:  Diagnosis Date   Anxiety    Benign prostatic hypertrophy    COVID-19 virus infection 02/24/2020   Depression    ED (erectile dysfunction)    Headache(784.0)    History of kidney stones    Hypertension    Hypogonadism male    Low back pain 06/09/2020   Nephrolithiasis    hx of had lithotripsy in 1-10.   NEPHROLITHIASIS, HX OF 10/25/2008   Qualifier: Diagnosis of   By: Clent Ridges MD, Tera Mater      Neuromuscular disorder [facility])    neuropaty legs   Pneumonia    Pyelonephritis 02/25/2022   Stroke (HCC)    fall 2023 some memory issues like time of day   Patient Active Problem List   Diagnosis Date Noted   Hypercoagulable state due to paroxysmal atrial fibrillation (HCC) 01/28/2023   Urethral bleeding 01/10/2023   Catheter-associated urinary tract infection (HCC) 01/08/2023   SIRS (systemic inflammatory response syndrome) (HCC) 01/08/2023   Hypoxia 01/08/2023   Hyponatremia 01/08/2023   Hypokalemia 01/08/2023   Elevated brain natriuretic peptide (BNP) level 01/08/2023   A-fib (HCC) 01/06/2023   Atrial fibrillation with rapid ventricular response (HCC) 01/05/2023   Tinea cruris 11/27/2022   Hematuria of unknown cause 10/29/2022   Gross hematuria 10/28/2022   Hypertensive urgency 10/28/2022   Mixed  hyperlipidemia 10/28/2022   Closed fracture of lateral malleolus of left fibula 06/15/2022   CVA (cerebral vascular accident) (HCC) 05/29/2022   Pressure injury of skin 05/29/2022   Acute CVA (cerebrovascular accident) (HCC) 05/27/2022   UTI (urinary tract infection) 05/27/2022   Indwelling Foley catheter present 05/27/2022   Bladder outflow obstruction 03/18/2022   B12 deficiency 02/06/2022   Bilateral leg weakness 01/10/2022   Depression 12/25/2021   Alcohol abuse 12/25/2021   Primary osteoarthritis of left knee 04/23/2018   Hypogonadism in male 01/19/2016   Foot swelling 01/19/2016   Insomnia 10/30/2013   INTERNAL HEMORRHOIDS WITH OTHER COMPLICATION 03/29/2010   Enlarged prostate 10/25/2008   Headache 09/15/2007   Essential hypertension 07/07/2007   Home Medication(s) Prior to Admission medications   Medication Sig Start Date End Date Taking? Authorizing Provider  amoxicillin-clavulanate (AUGMENTIN) 875-125 MG tablet Take 1 tablet by mouth every 12 (twelve) hours. 03/04/23  Yes Lonell Grandchild, MD  atorvastatin (LIPITOR) 20 MG tablet TAKE 1 TABLET BY MOUTH EVERY DAY 03/04/23   [name], MD  cyanocobalamin (VITAMIN B12) 1000 MCG/ML injection INJECT 1 ML (1,000 MCG TOTAL) INTO THE MUSCLE ONCE A WEEK. 03/13/22   [name], MD  cyclobenzaprine (FLEXERIL) 10 MG tablet Take 1 tablet (10 mg total) by mouth 3 (three) times daily as needed for muscle spasms. 12/14/22   [name], MD  diltiazem (CARDIZEM CD) 180 MG 24 hr capsule Take 1 capsule (180 mg total) by mouth daily. 01/07/23   Girguis,  Onalee Hua, MD  docusate sodium (COLACE) 100 MG capsule Take 100 mg by mouth every 12 (twelve) hours as needed.    [provider]  finasteride (PROSCAR) 5 MG tablet TAKE 1 TABLET (5 MG TOTAL) BY MOUTH DAILY. 03/04/23   [name], MD  fluticasone (FLONASE) 50 MCG/ACT nasal spray Place 1 spray into both nostrils daily. 10/10/22   Omar Person, MD  folic acid (FOLVITE) 1 MG  tablet Take 1 tablet (1 mg total) by mouth daily. 12/14/22   [name], MD  furosemide (LASIX) 20 MG tablet Take 1 tablet (20 mg total) by mouth daily. 12/14/22   [name], MD  hydrocortisone cream-nystatin cream-zinc oxide Apply topically. Not sure the name of the cream- Uses twice daily for wound care    [provider]  KLOR-CON M10 10 MEQ tablet Take 1 tablet (10 mEq total) by mouth 2 (two) times daily. 12/14/22   [name], MD  loperamide (IMODIUM) 2 MG capsule Take 1 capsule (2 mg total) by mouth every 8 (eight) hours as needed for diarrhea or loose stools. 12/14/22   [name], MD  melatonin 3 MG TABS tablet Take 3 mg by mouth at bedtime.    [provider]  Multiple Vitamin (MULTIVITAMIN WITH MINERALS) TABS tablet Take 1 tablet by mouth daily. 06/01/22   Marguerita Merles Latif, DO  ondansetron (ZOFRAN) 4 MG tablet Take 1 tablet (4 mg total) by mouth every 8 (eight) hours as needed for nausea or vomiting. 12/14/22   [name], MD  rivaroxaban (XARELTO) 20 MG TABS tablet Take 1 tablet (20 mg total) by mouth daily. 01/07/23   Lewie Chamber, MD  sertraline (ZOLOFT) 100 MG tablet TAKE 1 TABLET BY MOUTH EVERY DAY 03/04/23   [name], MD  SYRINGE-NEEDLE, DISP, 3 ML (BD ECLIPSE SYRINGE/NEEDLE) 25G X 5/8" 3 ML MISC Use as directed once a week 12/14/22   [name], MD  tamsulosin (FLOMAX) 0.4 MG CAPS capsule TAKE 1 CAPSULE BY MOUTH EVERY DAY Patient not taking: Reported on 02/28/2023 12/14/22   [name], MD  thiamine (VITAMIN B-1) 100 MG tablet Take 1 tablet (100 mg total) by mouth daily. 12/14/22   [name], MD                                                                                                                                    Past Surgical History Past Surgical History:  Procedure Laterality Date   colonoscopy  10/05/2020   per Dr. Russella Dar, adenomatous polyps, repeat in 3 yrs   CYSTOSCOPY W/ RETROGRADES Bilateral 12/25/2022    Procedure: CYSTOSCOPY, TRANSURETHERAL RESECTION OF PROSTATE;  Surgeon: Noel Christmas, MD;  Location: [facility];  Service: Urology;  Laterality: Bilateral;  90 MINS FRO CASE   FOOT SURGERY     INSERTION OF SUPRAPUBIC CATHETER N/A 12/25/2022   Procedure: INSERTION  OF SUPRAPUBIC CATHETER;  Surgeon: Noel Christmas, MD;  Location: [facility];  Service: Urology;  Laterality: N/A;   IR 3D INDEPENDENT WKST  01/16/2023   IR ANGIOGRAM PELVIS SELECTIVE OR SUPRASELECTIVE  01/16/2023   IR ANGIOGRAM SELECTIVE EACH ADDITIONAL VESSEL  01/16/2023   IR ANGIOGRAM SELECTIVE EACH ADDITIONAL VESSEL  01/16/2023   IR ANGIOGRAM SELECTIVE EACH ADDITIONAL VESSEL  01/16/2023   IR ANGIOGRAM SELECTIVE EACH ADDITIONAL VESSEL  01/16/2023   IR ANGIOGRAM SELECTIVE EACH ADDITIONAL VESSEL  01/16/2023   IR ANGIOGRAM SELECTIVE EACH ADDITIONAL VESSEL  01/16/2023   IR EMBO TUMOR ORGAN ISCHEMIA INFARCT INC GUIDE ROADMAPPING  01/16/2023   IR EMBO TUMOR ORGAN ISCHEMIA INFARCT INC GUIDE ROADMAPPING  01/16/2023   IR EMBO TUMOR ORGAN ISCHEMIA INFARCT INC GUIDE ROADMAPPING  01/16/2023   IR US GUIDE VASC ACCESS LEFT  01/16/2023   IR US GUIDE VASC ACCESS LEFT  01/16/2023   LAPAROSCOPIC APPENDECTOMY  03/12/2012   Procedure: APPENDECTOMY LAPAROSCOPIC;  Surgeon: Shelly Rubenstein, MD;  Location: [facility];  Service: General;  Laterality: N/A;   STAPLE HEMORRHOIDECTOMY  04/14/2010   per Dr. Michaell Cowing    TONSILLECTOMY     Family History Family History  Problem Relation Age of Onset   Heart Problems Mother        Caused by a Virus when she was a teen   Stroke Father    Prostate cancer Father    Hypertension Other    Stroke Other    Heart disease Other        rheumatic    Coronary artery disease Other    Healthy Child    Colon cancer Neg Hx    Colon polyps Neg Hx    Esophageal cancer Neg Hx    Rectal cancer Neg Hx    Stomach cancer Neg Hx     Social History Social History   Tobacco Use   Smoking status: Never   Smokeless tobacco: Never  Vaping  Use   Vaping status: Never Used  Substance Use Topics   Alcohol use: Not Currently    Alcohol/week: 14.0 standard drinks of alcohol    Types: 14 Standard drinks or equivalent per week    Comment: daily one drink   Drug use: No   Allergies Patient has no known allergies.  Review of Systems Review of Systems  All other systems reviewed and are negative.   Physical Exam Vital Signs  I have reviewed the triage vital signs BP (!) 171/101   Pulse 97   Temp 97.8 F (36.6 C) (Oral)   Resp 18   Ht 6' (1.829 m)   Wt 70.3 kg   SpO2 94%   BMI 21.02 kg/m  Physical Exam Vitals and nursing note reviewed.  Constitutional:      General: He is in acute distress (due to pain).     Appearance: Normal appearance.  HENT:     Mouth/Throat:     Mouth: Mucous membranes are moist.  Eyes:     Conjunctiva/sclera: Conjunctivae normal.  Cardiovascular:     Rate and Rhythm: Normal rate and regular rhythm.  Pulmonary:     Effort: Pulmonary effort is normal. No respiratory distress.     Breath sounds: Normal breath sounds.  Abdominal:     General: Abdomen is flat.     Palpations: Abdomen is soft.     Tenderness: There is abdominal tenderness (mild lower abdominal around SPT).     Comments: Lower abdomen with spt in place,  not draining urine, c/d/i  Musculoskeletal:     Right lower leg: No edema.     Left lower leg: No edema.  Skin:    General: Skin is warm and dry.     Capillary Refill: Capillary refill takes less than 2 seconds.  Neurological:     Mental Status: He is alert and oriented to person, place, and time. Mental status is at baseline.  Psychiatric:        Mood and Affect: Mood normal.        Behavior: Behavior normal.     ED Results and Treatments Labs (all labs ordered are listed, but only abnormal results are displayed) Labs Reviewed  URINALYSIS, ROUTINE W REFLEX MICROSCOPIC - Abnormal; Notable for the following components:      Result Value   APPearance CLOUDY (*)     Hgb urine dipstick SMALL (*)    Protein, ur 100 (*)    Leukocytes,Ua LARGE (*)    All other components within normal limits  BASIC METABOLIC PANEL - Abnormal; Notable for the following components:   Potassium 3.4 (*)    CO2 20 (*)    Glucose, Bld 138 (*)    All other components within normal limits  CBC WITH DIFFERENTIAL/PLATELET - Abnormal; Notable for the following components:   WBC 16.9 (*)    Neutro Abs 12.9 (*)    Monocytes Absolute 1.4 (*)    Abs Immature Granulocytes 0.12 (*)    All other components within normal limits  URINE CULTURE                                                                                                                          Radiology No results found.  Pertinent labs & imaging results that were available during my care of the patient were reviewed by me and considered in my medical decision making (see MDM for details).  Medications Ordered in ED Medications  HYDROmorphone (DILAUDID) injection 0.5 mg (0.5 mg Intravenous Given 03/04/23 1017)                                                                                                                                     Procedures SUPRAPUBIC TUBE PLACEMENT  Date/Time: 03/04/2023 10:30 AM  Performed by: Lonell Grandchild, MD Authorized by: Lonell Grandchild, MD   Consent:    Consent obtained:  Verbal  Consent given by:  Patient   Risks, benefits, and alternatives were discussed: yes     Risks discussed:  Bleeding, bowel perforation, infection and pain   Alternatives discussed:  No treatment and alternative treatment Universal protocol:    Procedure explained and questions answered to patient or proxy's satisfaction: yes     Patient identity confirmed:  Verbally with patient and arm band Sedation:    Sedation type:  None Anesthesia:    Anesthesia method:  None Procedure details:    Complexity:  Simple   Catheter type:  Foley   Catheter size:  18 Fr   Ultrasound guidance: no      Number of attempts:  1   Urine characteristics:  Mildly cloudy and yellow Post-procedure details:    Procedure completion:  Tolerated well, no immediate complications   (including critical care time)  Medical Decision Making / ED Course   MDM:  77 year old presenting with catheter issue.  Patient with suprapubic tube that is not draining.  Will attempt to replace this.  Has had this issue previously.  Will check urinalysis, basic labs.  Vitals notable for hypertension, tachycardia but suspect this is primarily related due to patient being very uncomfortable. Will re-assess  Clinical Course as of 03/04/23 1153  Mon Mar 04, 2023  1148 Tachycardia improved after placement of suprapubic tube.  Last urine culture grew Proteus and VRE but was sensitive to ampicillin.  No bacteria on UA but given leukocytosis, prior episodes in the setting of UTI will prescribe Augmentin. Will discharge patient to home. All questions answered. Patient comfortable with plan of discharge. Return precautions discussed with patient and specified on the after visit summary.  [WS]    Clinical Course User Index [WS] Lonell Grandchild, MD     Additional history obtained: -Additional history obtained from family and ems -External records from outside source obtained and reviewed including: Chart review including previous notes, labs, imaging, consultation notes including prior ER visits and urine cultures   Lab Tests: -I ordered, reviewed, and interpreted labs.   The pertinent results include:   Labs Reviewed  URINALYSIS, ROUTINE W REFLEX MICROSCOPIC - Abnormal; Notable for the following components:      Result Value   APPearance CLOUDY (*)    Hgb urine dipstick SMALL (*)    Protein, ur 100 (*)    Leukocytes,Ua LARGE (*)    All other components within normal limits  BASIC METABOLIC PANEL - Abnormal; Notable for the following components:   Potassium 3.4 (*)    CO2 20 (*)    Glucose, Bld 138 (*)     All other components within normal limits  CBC WITH DIFFERENTIAL/PLATELET - Abnormal; Notable for the following components:   WBC 16.9 (*)    Neutro Abs 12.9 (*)    Monocytes Absolute 1.4 (*)    Abs Immature Granulocytes 0.12 (*)    All other components within normal limits  URINE CULTURE    Notable for leukocytosis, signs of UTI  Medicines ordered and prescription drug management: Meds ordered this encounter  Medications   HYDROmorphone (DILAUDID) injection 0.5 mg   amoxicillin-clavulanate (AUGMENTIN) 875-125 MG tablet    Sig: Take 1 tablet by mouth every 12 (twelve) hours.    Dispense:  14 tablet    Refill:  0    -I have reviewed the patients home medicines and have made adjustments as needed   Cardiac Monitoring: The patient was maintained on a cardiac monitor.  I personally viewed and  interpreted the cardiac monitored which showed an underlying rhythm of: NSR  Social Determinants of Health:  Diagnosis or treatment significantly limited by social determinants of health: obesity   Reevaluation: After the interventions noted above, I reevaluated the patient and found that their symptoms have improved  Co morbidities that complicate the patient evaluation  Past Medical History:  Diagnosis Date   Anxiety    Benign prostatic hypertrophy    COVID-19 virus infection 02/24/2020   Depression    ED (erectile dysfunction)    Headache(784.0)    History of kidney stones    Hypertension    Hypogonadism male    Low back pain 06/09/2020   Nephrolithiasis    hx of had lithotripsy in 1-10.   NEPHROLITHIASIS, HX OF 10/25/2008   Qualifier: Diagnosis of   By: Clent Ridges MD, Tera Mater      Neuromuscular disorder [facility])    neuropaty legs   Pneumonia    Pyelonephritis 02/25/2022   Stroke [facility])    fall 2023 some memory issues like time of day      Dispostion: Disposition decision including need for hospitalization was considered, and patient discharged from emergency  department.    Final Clinical Impression(s) / ED Diagnoses Final diagnoses:  Suprapubic catheter dysfunction, initial encounter [facility])     This chart was dictated using voice recognition software.  Despite best efforts to proofread,  errors can occur which can change the documentation meaning.    Lonell Grandchild, MD 03/04/23 1153

## 2023-03-04 NOTE — ED Notes (Signed)
Pt had used the bathroom number 2 on himself. Pt was cleaned up by Clinical research associate.

## 2023-03-04 NOTE — ED Triage Notes (Signed)
Pt BIBA c/o of indwelling catheter that is not draining properly since 0300 with pain in lower abdomen. Has not taken home meds. A&Ox4, nonambulatory at present.   218/112  HR 112 RR 20 97% RA

## 2023-03-06 ENCOUNTER — Telehealth: Payer: Self-pay | Admitting: Family Medicine

## 2023-03-06 LAB — URINE CULTURE: Culture: >=100000 — AB

## 2023-03-06 NOTE — Telephone Encounter (Signed)
Rhonda from [facility] call and stated she will be going out to see pt on Saturday . Bjorn Loser # is [phone].

## 2023-03-07 NOTE — Telephone Encounter (Signed)
Post ED Visit - Positive Culture Follow-up: Successful Patient Follow-Up  Culture assessed and recommendations reviewed by:  [x]  Stephenie Acres, Pharm.D. []  Celedonio Miyamoto, Pharm.D., BCPS AQ-ID []  Garvin Fila, Pharm.D., BCPS []  Georgina Pillion, Pharm.D., BCPS []  [place], [address].D., BCPS, AAHIVP []  Estella Husk, Pharm.D., BCPS, AAHIVP []  Lysle Pearl, PharmD, BCPS []  Phillips Climes, PharmD, BCPS []  Agapito Games, PharmD, BCPS []  Verlan Friends, PharmD  Positive urine culture  []  Patient discharged without antimicrobial prescription and treatment is now indicated []  Organism is resistant to prescribed ED discharge antimicrobial []  Patient with positive blood cultures  Changes discussed with ED provider: Benjiman Core Spoke to [facility] and she stated patient is feeling much better and urine has cleared up. Plan to stop antibiotic    Bing Quarry 03/07/2023, 12:00 PM

## 2023-03-07 NOTE — Progress Notes (Signed)
ED Antimicrobial Stewardship Positive Culture Follow Up   Calvin [name] is an 77 y.o. male who presented to [facility] on 03/04/2023 with a chief complaint of  Chief Complaint  Patient presents with   Indwelling Catheter Issue    Recent Results (from the past 720 hour(s))  Urine Culture     Status: Abnormal   Collection Time: 03/04/23 10:40 AM   Specimen: Urine, Clean Catch  Result Value Ref Range Status   Specimen Description   Final    URINE, CLEAN CATCH Performed at [facility], [address] [place], [place] [address]    Special Requests   Final    NONE Performed at [facility], [address], [place], [place] [address]    Culture >=100,000 COLONIES/mL PROTEUS MIRABILIS (A)  Final   Report Status 03/06/2023 FINAL  Final   Organism ID, Bacteria PROTEUS MIRABILIS (A)  Final      Susceptibility   Proteus mirabilis - MIC*    AMPICILLIN <=2 SENSITIVE Sensitive     CEFAZOLIN 8 SENSITIVE Sensitive     CEFEPIME <=0.12 SENSITIVE Sensitive     CEFTRIAXONE <=0.25 SENSITIVE Sensitive     CIPROFLOXACIN 1 RESISTANT Resistant     GENTAMICIN <=1 SENSITIVE Sensitive     IMIPENEM 1 SENSITIVE Sensitive     NITROFURANTOIN 128 RESISTANT Resistant     TRIMETH/SULFA <=20 SENSITIVE Sensitive     AMPICILLIN/SULBACTAM <=2 SENSITIVE Sensitive     PIP/TAZO <=4 SENSITIVE Sensitive     * >=100,000 COLONIES/mL PROTEUS MIRABILIS    Patient presented to the ED due to chronic foley catheter not draining for ~7 hours prior to presentation and abdominal pain. He was tachycardic with elevated WBC 16.9, however, tachycardia improved following catheter replacement in ED and contributed to anxiety surrounding the complication (per MD note 9/16) and he was afebrile. UA positive for pyuria and patient discharged with a 7 day course of augmentin due to systemic symptoms (tachycardia/WBC) on initial presentation. Urine culture positive >100k for pansensitive proteus  mirabilis though likely colonizer (has grown proteus in urine x4 since 12/23, among other organisms).   Recommendation: symptom check - if patient is symptomatic (specifically fever), continue treatment and follow up with urology - if patient is not symptomatic, discontinue antibiotics  ED Provider: Benjiman Core, MD  Stephenie Acres, PharmD PGY1 Pharmacy Resident 03/07/2023 10:00 AM

## 2023-03-08 ENCOUNTER — Telehealth: Payer: Self-pay | Admitting: Family Medicine

## 2023-03-08 DIAGNOSIS — N138 Other obstructive and reflux uropathy: Secondary | ICD-10-CM | POA: Diagnosis not present

## 2023-03-08 DIAGNOSIS — Z7982 Long term (current) use of aspirin: Secondary | ICD-10-CM | POA: Diagnosis not present

## 2023-03-08 DIAGNOSIS — N319 Neuromuscular dysfunction of bladder, unspecified: Secondary | ICD-10-CM | POA: Diagnosis not present

## 2023-03-08 DIAGNOSIS — Z9181 History of falling: Secondary | ICD-10-CM | POA: Diagnosis not present

## 2023-03-08 DIAGNOSIS — N401 Enlarged prostate with lower urinary tract symptoms: Secondary | ICD-10-CM | POA: Diagnosis not present

## 2023-03-08 DIAGNOSIS — E538 Deficiency of other specified B group vitamins: Secondary | ICD-10-CM | POA: Diagnosis not present

## 2023-03-08 DIAGNOSIS — M1990 Unspecified osteoarthritis, unspecified site: Secondary | ICD-10-CM | POA: Diagnosis not present

## 2023-03-08 DIAGNOSIS — F32A Depression, unspecified: Secondary | ICD-10-CM | POA: Diagnosis not present

## 2023-03-08 DIAGNOSIS — I4891 Unspecified atrial fibrillation: Secondary | ICD-10-CM | POA: Diagnosis not present

## 2023-03-08 DIAGNOSIS — I10 Essential (primary) hypertension: Secondary | ICD-10-CM | POA: Diagnosis not present

## 2023-03-08 DIAGNOSIS — Z7901 Long term (current) use of anticoagulants: Secondary | ICD-10-CM | POA: Diagnosis not present

## 2023-03-08 DIAGNOSIS — Z9359 Other cystostomy status: Secondary | ICD-10-CM | POA: Diagnosis not present

## 2023-03-08 DIAGNOSIS — F419 Anxiety disorder, unspecified: Secondary | ICD-10-CM | POA: Diagnosis not present

## 2023-03-08 DIAGNOSIS — Z466 Encounter for fitting and adjustment of urinary device: Secondary | ICD-10-CM | POA: Diagnosis not present

## 2023-03-08 DIAGNOSIS — E785 Hyperlipidemia, unspecified: Secondary | ICD-10-CM | POA: Diagnosis not present

## 2023-03-08 NOTE — Telephone Encounter (Signed)
Noted  

## 2023-03-08 NOTE — Telephone Encounter (Signed)
Nursing every other week for 8 weeks and 2 as needed

## 2023-03-11 ENCOUNTER — Other Ambulatory Visit: Payer: Self-pay | Admitting: Family Medicine

## 2023-03-11 DIAGNOSIS — I10 Essential (primary) hypertension: Secondary | ICD-10-CM | POA: Diagnosis not present

## 2023-03-11 DIAGNOSIS — Z7901 Long term (current) use of anticoagulants: Secondary | ICD-10-CM | POA: Diagnosis not present

## 2023-03-11 DIAGNOSIS — Z7982 Long term (current) use of aspirin: Secondary | ICD-10-CM | POA: Diagnosis not present

## 2023-03-11 DIAGNOSIS — N319 Neuromuscular dysfunction of bladder, unspecified: Secondary | ICD-10-CM | POA: Diagnosis not present

## 2023-03-11 DIAGNOSIS — E785 Hyperlipidemia, unspecified: Secondary | ICD-10-CM | POA: Diagnosis not present

## 2023-03-11 DIAGNOSIS — Z9359 Other cystostomy status: Secondary | ICD-10-CM | POA: Diagnosis not present

## 2023-03-11 DIAGNOSIS — M1990 Unspecified osteoarthritis, unspecified site: Secondary | ICD-10-CM | POA: Diagnosis not present

## 2023-03-11 DIAGNOSIS — N401 Enlarged prostate with lower urinary tract symptoms: Secondary | ICD-10-CM | POA: Diagnosis not present

## 2023-03-11 DIAGNOSIS — Z9181 History of falling: Secondary | ICD-10-CM | POA: Diagnosis not present

## 2023-03-11 DIAGNOSIS — F419 Anxiety disorder, unspecified: Secondary | ICD-10-CM | POA: Diagnosis not present

## 2023-03-11 DIAGNOSIS — Z466 Encounter for fitting and adjustment of urinary device: Secondary | ICD-10-CM | POA: Diagnosis not present

## 2023-03-11 DIAGNOSIS — F32A Depression, unspecified: Secondary | ICD-10-CM | POA: Diagnosis not present

## 2023-03-11 DIAGNOSIS — N138 Other obstructive and reflux uropathy: Secondary | ICD-10-CM | POA: Diagnosis not present

## 2023-03-11 DIAGNOSIS — E538 Deficiency of other specified B group vitamins: Secondary | ICD-10-CM | POA: Diagnosis not present

## 2023-03-11 DIAGNOSIS — I4891 Unspecified atrial fibrillation: Secondary | ICD-10-CM | POA: Diagnosis not present

## 2023-03-11 NOTE — Telephone Encounter (Signed)
Please okay the orders

## 2023-03-11 NOTE — Telephone Encounter (Signed)
[facility], no answer, left VM to call the office back about VO.

## 2023-03-12 NOTE — Telephone Encounter (Signed)
Called [place], left VM to call office.

## 2023-03-15 ENCOUNTER — Encounter: Payer: Self-pay | Admitting: Family Medicine

## 2023-03-15 ENCOUNTER — Telehealth: Payer: Self-pay | Admitting: Family Medicine

## 2023-03-15 ENCOUNTER — Telehealth (INDEPENDENT_AMBULATORY_CARE_PROVIDER_SITE_OTHER): Payer: Medicare HMO | Admitting: Family Medicine

## 2023-03-15 DIAGNOSIS — I639 Cerebral infarction, unspecified: Secondary | ICD-10-CM

## 2023-03-15 DIAGNOSIS — I4891 Unspecified atrial fibrillation: Secondary | ICD-10-CM | POA: Diagnosis not present

## 2023-03-15 DIAGNOSIS — F341 Dysthymic disorder: Secondary | ICD-10-CM

## 2023-03-15 DIAGNOSIS — R4189 Other symptoms and signs involving cognitive functions and awareness: Secondary | ICD-10-CM | POA: Insufficient documentation

## 2023-03-15 DIAGNOSIS — Z978 Presence of other specified devices: Secondary | ICD-10-CM

## 2023-03-15 DIAGNOSIS — E785 Hyperlipidemia, unspecified: Secondary | ICD-10-CM | POA: Insufficient documentation

## 2023-03-15 MED ORDER — HYDROCODONE BIT-HOMATROP MBR 5-1.5 MG/5ML PO SOLN: 5.0000 mL | ORAL | 0 refills | Status: DC | PRN

## 2023-03-15 MED ORDER — ATORVASTATIN CALCIUM 20 MG PO TABS: 20.0000 mg | ORAL_TABLET | Freq: Every day | ORAL | 3 refills | Status: DC

## 2023-03-15 MED ORDER — ALBUTEROL SULFATE HFA 108 (90 BASE) MCG/ACT IN AERS: 2.0000 | INHALATION_SPRAY | RESPIRATORY_TRACT | 5 refills | Status: AC | PRN

## 2023-03-15 MED ORDER — RIVAROXABAN 20 MG PO TABS: 20.0000 mg | ORAL_TABLET | Freq: Every day | ORAL | 3 refills | Status: AC

## 2023-03-15 NOTE — Telephone Encounter (Signed)
Please okay this order  ?

## 2023-03-15 NOTE — Telephone Encounter (Signed)
Spoke with Jomarie Longs with [facility] advised that Dr Markus Jarvis the VO order

## 2023-03-15 NOTE — Telephone Encounter (Signed)
Left detailed message for Graco OT with [facility], advised that  Dr Clent Ridges has approved the VO requested

## 2023-03-15 NOTE — Telephone Encounter (Signed)
Calvin [name], PT with [facility] is reporting a missed visit for today.  PT says they will try again next week.  [phone]

## 2023-03-15 NOTE — Telephone Encounter (Signed)
Reschedule OT eval to 03/19/23. Contact phone number has secured voicemail if no one answers

## 2023-03-15 NOTE — Progress Notes (Signed)
Subjective:    Patient ID: Calvin [name], male    DOB: 1946-02-24, 77 y.o.   [name]: [id number]  HPI Virtual Visit via Video Note  I connected with the patient on 03/15/23 at 10:00 AM EDT by a video enabled telemedicine application and verified that I am speaking with the correct person using two identifiers.  Location patient: home Location provider:work or home office Persons participating in the virtual visit: patient, provider  I discussed the limitations of evaluation and management by telemedicine and the availability of in person appointments. The patient expressed understanding and agreed to proceed.   HPI: Here with his daughter, Calvin [name] his caretaker, Calvin [name], for medication refills and also to discuss Rafe's mental capacity. He suffered a stroke on 06-04-22, and he has had a progressive decline in his cognitive abilities ever since. The family says he is very forgetful, he often gets very confused, and he gets family members mixed up. He often thinks his daughter Calvin [name] is his other daughter Calvin [name] who lives in [place], [place]. The family is concerned that he is not able to handle his own finances and his own legal situations. They ask [place] to provide documentation that he is not competent to make decisions. Finally he has had a dry cough and some wheezing for the past few days. No SOB or fever.   ROS: See pertinent positives and negatives per HPI.  Past Medical History:  Diagnosis Date   Anxiety    Benign prostatic hypertrophy    COVID-19 virus infection 02/24/2020   Depression    ED (erectile dysfunction)    Headache(784.0)    History of kidney stones    Hypertension    Hypogonadism male    Low back pain 06/09/2020   Nephrolithiasis    hx of had lithotripsy in 1-10.   NEPHROLITHIASIS, HX OF 10/25/2008   Qualifier: Diagnosis of   By: Clent Ridges MD, Tera Mater      Neuromuscular disorder [facility])    neuropaty legs   Pneumonia    Pyelonephritis  02/25/2022   Stroke [facility])    fall 2023 some memory issues like time of day    Past Surgical History:  Procedure Laterality Date   colonoscopy  10/05/2020   per Dr. Russella Dar, adenomatous polyps, repeat in 3 yrs   CYSTOSCOPY W/ RETROGRADES Bilateral 12/25/2022   Procedure: CYSTOSCOPY, TRANSURETHERAL RESECTION OF PROSTATE;  Surgeon: Noel Christmas, MD;  Location: [facility];  Service: Urology;  Laterality: Bilateral;  90 MINS FRO CASE   FOOT SURGERY     INSERTION OF SUPRAPUBIC CATHETER N/A 12/25/2022   Procedure: INSERTION OF SUPRAPUBIC CATHETER;  Surgeon: Noel Christmas, MD;  Location: [facility];  Service: Urology;  Laterality: N/A;   IR 3D INDEPENDENT WKST  01/16/2023   IR ANGIOGRAM PELVIS SELECTIVE OR SUPRASELECTIVE  01/16/2023   IR ANGIOGRAM SELECTIVE EACH ADDITIONAL VESSEL  01/16/2023   IR ANGIOGRAM SELECTIVE EACH ADDITIONAL VESSEL  01/16/2023   IR ANGIOGRAM SELECTIVE EACH ADDITIONAL VESSEL  01/16/2023   IR ANGIOGRAM SELECTIVE EACH ADDITIONAL VESSEL  01/16/2023   IR ANGIOGRAM SELECTIVE EACH ADDITIONAL VESSEL  01/16/2023   IR ANGIOGRAM SELECTIVE EACH ADDITIONAL VESSEL  01/16/2023   IR EMBO TUMOR ORGAN ISCHEMIA INFARCT INC GUIDE ROADMAPPING  01/16/2023   IR EMBO TUMOR ORGAN ISCHEMIA INFARCT INC GUIDE ROADMAPPING  01/16/2023   IR EMBO TUMOR ORGAN ISCHEMIA INFARCT INC GUIDE ROADMAPPING  01/16/2023   IR US GUIDE VASC ACCESS LEFT  01/16/2023   IR US  GUIDE VASC ACCESS LEFT  01/16/2023   LAPAROSCOPIC APPENDECTOMY  03/12/2012   Procedure: APPENDECTOMY LAPAROSCOPIC;  Surgeon: Shelly Rubenstein, MD;  Location: [facility] OR;  Service: General;  Laterality: N/A;   STAPLE HEMORRHOIDECTOMY  04/14/2010   per Dr. Michaell Cowing    TONSILLECTOMY      Family History  Problem Relation Age of Onset   Heart Problems Mother        Caused by a Virus when she was a teen   Stroke Father    Prostate cancer Father    Hypertension Other    Stroke Other    Heart disease Other        rheumatic    Coronary artery disease Other     Healthy Child    Colon cancer Neg Hx    Colon polyps Neg Hx    Esophageal cancer Neg Hx    Rectal cancer Neg Hx    Stomach cancer Neg Hx      Current Outpatient Medications:    amoxicillin-clavulanate (AUGMENTIN) 875-125 MG tablet, Take 1 tablet by mouth every 12 (twelve) hours., Disp: 14 tablet, Rfl: 0   atorvastatin (LIPITOR) 20 MG tablet, TAKE 1 TABLET BY MOUTH EVERY DAY, Disp: 90 tablet, Rfl: 3   cyanocobalamin (VITAMIN B12) 1000 MCG/ML injection, INJECT 1 ML (1,000 MCG TOTAL) INTO THE MUSCLE ONCE A WEEK., Disp: 12 mL, Rfl: 1   cyclobenzaprine (FLEXERIL) 10 MG tablet, Take 1 tablet (10 mg total) by mouth 3 (three) times daily as needed for muscle spasms., Disp: 90 tablet, Rfl: 5   diltiazem (CARDIZEM CD) 180 MG 24 hr capsule, Take 1 capsule (180 mg total) by mouth daily., Disp: 30 capsule, Rfl: 3   docusate sodium (COLACE) 100 MG capsule, Take 100 mg by mouth every 12 (twelve) hours as needed., Disp: , Rfl:    finasteride (PROSCAR) 5 MG tablet, TAKE 1 TABLET (5 MG TOTAL) BY MOUTH DAILY., Disp: 90 tablet, Rfl: 1   fluticasone (FLONASE) 50 MCG/ACT nasal spray, Place 1 spray into both nostrils daily., Disp: 16 g, Rfl: 11   folic acid (FOLVITE) 1 MG tablet, Take 1 tablet (1 mg total) by mouth daily., Disp: 90 tablet, Rfl: 3   furosemide (LASIX) 20 MG tablet, Take 1 tablet (20 mg total) by mouth daily., Disp: 90 tablet, Rfl: 3   hydrocortisone cream-nystatin cream-zinc oxide, Apply topically. Not sure the name of the cream- Uses twice daily for wound care, Disp: , Rfl:    KLOR-CON M10 10 MEQ tablet, Take 1 tablet (10 mEq total) by mouth 2 (two) times daily., Disp: 180 tablet, Rfl: 3   loperamide (IMODIUM) 2 MG capsule, Take 1 capsule (2 mg total) by mouth every 8 (eight) hours as needed for diarrhea or loose stools., Disp: 60 capsule, Rfl: 5   melatonin 3 MG TABS tablet, Take 3 mg by mouth at bedtime., Disp: , Rfl:    Multiple Vitamin (MULTIVITAMIN WITH MINERALS) TABS tablet, Take 1 tablet by  mouth daily., Disp: 30 tablet, Rfl: 0   ondansetron (ZOFRAN) 4 MG tablet, Take 1 tablet (4 mg total) by mouth every 8 (eight) hours as needed for nausea or vomiting., Disp: 90 tablet, Rfl: 5   potassium chloride (KLOR-CON) 10 MEQ tablet, TAKE 1 TABLET BY MOUTH 2 TIMES DAILY., Disp: 180 tablet, Rfl: 0   rivaroxaban (XARELTO) 20 MG TABS tablet, Take 1 tablet (20 mg total) by mouth daily., Disp: 90 tablet, Rfl: 2   sertraline (ZOLOFT) 100 MG tablet, TAKE 1 TABLET  BY MOUTH EVERY DAY, Disp: 90 tablet, Rfl: 0   SYRINGE-NEEDLE, DISP, 3 ML (BD ECLIPSE SYRINGE/NEEDLE) 25G X 5/8" 3 ML MISC, Use as directed once a week, Disp: 12 each, Rfl: 3   tamsulosin (FLOMAX) 0.4 MG CAPS capsule, TAKE 1 CAPSULE BY MOUTH EVERY DAY, Disp: 90 capsule, Rfl: 3   thiamine (VITAMIN B-1) 100 MG tablet, Take 1 tablet (100 mg total) by mouth daily., Disp: 90 tablet, Rfl: 3  EXAM:  VITALS per patient if applicable:  GENERAL: alert, oriented, appears well and in no acute distress  HEENT: atraumatic, conjunttiva clear, no obvious abnormalities on inspection of external nose and ears  NECK: normal movements of the head and neck  LUNGS: on inspection no signs of respiratory distress, breathing rate appears normal, no obvious gross SOB, gasping or wheezing  CV: no obvious cyanosis  MS: moves all visible extremities without noticeable abnormality  PSYCH/NEURO: pleasant and cooperative, no obvious depression or anxiety, speech and thought processing grossly intact  ASSESSMENT AND PLAN: For his dyslipidemia and atrial fibrillation, we will send in refills for Lipitor and Xarelto. For the upper respiratory symptoms, we will give him Hycodan syrup and an Albuterol inhaler. As for his declining cognitive status, we will compose a letter to document that we feel he is not competent to make his own legal or financial or medical decisions. His Power of Gerrit Friends is his daughter Calvin [name].  Gershon Crane, MD  Discussed the  following assessment and plan:  No diagnosis found.     I discussed the assessment and treatment plan with the patient. The patient was provided an opportunity to ask questions and all were answered. The patient agreed with the plan and demonstrated an understanding of the instructions.   The patient was advised to call back or seek an in-person evaluation if the symptoms worsen or if the condition fails to improve as anticipated.      Review of Systems     Objective:   Physical Exam        Assessment & Plan:

## 2023-03-18 DIAGNOSIS — M1990 Unspecified osteoarthritis, unspecified site: Secondary | ICD-10-CM | POA: Diagnosis not present

## 2023-03-18 DIAGNOSIS — E538 Deficiency of other specified B group vitamins: Secondary | ICD-10-CM | POA: Diagnosis not present

## 2023-03-18 DIAGNOSIS — E785 Hyperlipidemia, unspecified: Secondary | ICD-10-CM | POA: Diagnosis not present

## 2023-03-18 DIAGNOSIS — Z466 Encounter for fitting and adjustment of urinary device: Secondary | ICD-10-CM | POA: Diagnosis not present

## 2023-03-18 DIAGNOSIS — I10 Essential (primary) hypertension: Secondary | ICD-10-CM | POA: Diagnosis not present

## 2023-03-18 DIAGNOSIS — F419 Anxiety disorder, unspecified: Secondary | ICD-10-CM | POA: Diagnosis not present

## 2023-03-18 DIAGNOSIS — Z9359 Other cystostomy status: Secondary | ICD-10-CM | POA: Diagnosis not present

## 2023-03-18 DIAGNOSIS — Z9181 History of falling: Secondary | ICD-10-CM | POA: Diagnosis not present

## 2023-03-18 DIAGNOSIS — N138 Other obstructive and reflux uropathy: Secondary | ICD-10-CM | POA: Diagnosis not present

## 2023-03-18 DIAGNOSIS — Z7982 Long term (current) use of aspirin: Secondary | ICD-10-CM | POA: Diagnosis not present

## 2023-03-18 DIAGNOSIS — I4891 Unspecified atrial fibrillation: Secondary | ICD-10-CM | POA: Diagnosis not present

## 2023-03-18 DIAGNOSIS — F32A Depression, unspecified: Secondary | ICD-10-CM | POA: Diagnosis not present

## 2023-03-18 DIAGNOSIS — N401 Enlarged prostate with lower urinary tract symptoms: Secondary | ICD-10-CM | POA: Diagnosis not present

## 2023-03-18 DIAGNOSIS — N319 Neuromuscular dysfunction of bladder, unspecified: Secondary | ICD-10-CM | POA: Diagnosis not present

## 2023-03-18 DIAGNOSIS — Z7901 Long term (current) use of anticoagulants: Secondary | ICD-10-CM | POA: Diagnosis not present

## 2023-03-19 ENCOUNTER — Telehealth: Payer: Self-pay | Admitting: Family Medicine

## 2023-03-19 DIAGNOSIS — E785 Hyperlipidemia, unspecified: Secondary | ICD-10-CM | POA: Diagnosis not present

## 2023-03-19 DIAGNOSIS — N319 Neuromuscular dysfunction of bladder, unspecified: Secondary | ICD-10-CM | POA: Diagnosis not present

## 2023-03-19 DIAGNOSIS — F32A Depression, unspecified: Secondary | ICD-10-CM | POA: Diagnosis not present

## 2023-03-19 DIAGNOSIS — N138 Other obstructive and reflux uropathy: Secondary | ICD-10-CM | POA: Diagnosis not present

## 2023-03-19 DIAGNOSIS — M1990 Unspecified osteoarthritis, unspecified site: Secondary | ICD-10-CM | POA: Diagnosis not present

## 2023-03-19 DIAGNOSIS — N401 Enlarged prostate with lower urinary tract symptoms: Secondary | ICD-10-CM | POA: Diagnosis not present

## 2023-03-19 DIAGNOSIS — E538 Deficiency of other specified B group vitamins: Secondary | ICD-10-CM | POA: Diagnosis not present

## 2023-03-19 DIAGNOSIS — Z7901 Long term (current) use of anticoagulants: Secondary | ICD-10-CM | POA: Diagnosis not present

## 2023-03-19 DIAGNOSIS — Z7982 Long term (current) use of aspirin: Secondary | ICD-10-CM | POA: Diagnosis not present

## 2023-03-19 DIAGNOSIS — I4891 Unspecified atrial fibrillation: Secondary | ICD-10-CM | POA: Diagnosis not present

## 2023-03-19 DIAGNOSIS — I10 Essential (primary) hypertension: Secondary | ICD-10-CM | POA: Diagnosis not present

## 2023-03-19 DIAGNOSIS — Z9181 History of falling: Secondary | ICD-10-CM | POA: Diagnosis not present

## 2023-03-19 DIAGNOSIS — F419 Anxiety disorder, unspecified: Secondary | ICD-10-CM | POA: Diagnosis not present

## 2023-03-19 DIAGNOSIS — Z9359 Other cystostomy status: Secondary | ICD-10-CM | POA: Diagnosis not present

## 2023-03-19 DIAGNOSIS — Z466 Encounter for fitting and adjustment of urinary device: Secondary | ICD-10-CM | POA: Diagnosis not present

## 2023-03-19 NOTE — Telephone Encounter (Signed)
Calvin [name] OT with [facility] is calling and pt need DME bedside commode and tub transfer bench tried [employer]

## 2023-03-19 NOTE — Telephone Encounter (Signed)
Spoke with [facility] nurse aware that  Dr Clent Ridges increased pt Diltiazem to BID Am/Pm verbalized understanding. Pt daughter and caregiver notified

## 2023-03-19 NOTE — Telephone Encounter (Signed)
Delorise Shiner OT [facility] is calling and pt needs OT 1x8

## 2023-03-20 DIAGNOSIS — N401 Enlarged prostate with lower urinary tract symptoms: Secondary | ICD-10-CM | POA: Diagnosis not present

## 2023-03-20 DIAGNOSIS — E785 Hyperlipidemia, unspecified: Secondary | ICD-10-CM | POA: Diagnosis not present

## 2023-03-20 DIAGNOSIS — Z7982 Long term (current) use of aspirin: Secondary | ICD-10-CM | POA: Diagnosis not present

## 2023-03-20 DIAGNOSIS — Z9359 Other cystostomy status: Secondary | ICD-10-CM | POA: Diagnosis not present

## 2023-03-20 DIAGNOSIS — I10 Essential (primary) hypertension: Secondary | ICD-10-CM | POA: Diagnosis not present

## 2023-03-20 DIAGNOSIS — E538 Deficiency of other specified B group vitamins: Secondary | ICD-10-CM | POA: Diagnosis not present

## 2023-03-20 DIAGNOSIS — I4891 Unspecified atrial fibrillation: Secondary | ICD-10-CM | POA: Diagnosis not present

## 2023-03-20 DIAGNOSIS — N319 Neuromuscular dysfunction of bladder, unspecified: Secondary | ICD-10-CM | POA: Diagnosis not present

## 2023-03-20 DIAGNOSIS — Z466 Encounter for fitting and adjustment of urinary device: Secondary | ICD-10-CM | POA: Diagnosis not present

## 2023-03-20 DIAGNOSIS — F32A Depression, unspecified: Secondary | ICD-10-CM | POA: Diagnosis not present

## 2023-03-20 DIAGNOSIS — Z9181 History of falling: Secondary | ICD-10-CM | POA: Diagnosis not present

## 2023-03-20 DIAGNOSIS — Z7901 Long term (current) use of anticoagulants: Secondary | ICD-10-CM | POA: Diagnosis not present

## 2023-03-20 DIAGNOSIS — F419 Anxiety disorder, unspecified: Secondary | ICD-10-CM | POA: Diagnosis not present

## 2023-03-20 DIAGNOSIS — M1990 Unspecified osteoarthritis, unspecified site: Secondary | ICD-10-CM | POA: Diagnosis not present

## 2023-03-20 DIAGNOSIS — N138 Other obstructive and reflux uropathy: Secondary | ICD-10-CM | POA: Diagnosis not present

## 2023-03-20 NOTE — Telephone Encounter (Signed)
Please okay these orders  ?

## 2023-03-21 NOTE — Telephone Encounter (Signed)
Spoke with grace with [facility] advised VO requested have been approved

## 2023-03-22 DIAGNOSIS — F419 Anxiety disorder, unspecified: Secondary | ICD-10-CM | POA: Diagnosis not present

## 2023-03-22 DIAGNOSIS — N401 Enlarged prostate with lower urinary tract symptoms: Secondary | ICD-10-CM | POA: Diagnosis not present

## 2023-03-22 DIAGNOSIS — Z7901 Long term (current) use of anticoagulants: Secondary | ICD-10-CM | POA: Diagnosis not present

## 2023-03-22 DIAGNOSIS — E538 Deficiency of other specified B group vitamins: Secondary | ICD-10-CM | POA: Diagnosis not present

## 2023-03-22 DIAGNOSIS — N138 Other obstructive and reflux uropathy: Secondary | ICD-10-CM | POA: Diagnosis not present

## 2023-03-22 DIAGNOSIS — F32A Depression, unspecified: Secondary | ICD-10-CM | POA: Diagnosis not present

## 2023-03-22 DIAGNOSIS — I4891 Unspecified atrial fibrillation: Secondary | ICD-10-CM | POA: Diagnosis not present

## 2023-03-22 DIAGNOSIS — M1990 Unspecified osteoarthritis, unspecified site: Secondary | ICD-10-CM | POA: Diagnosis not present

## 2023-03-22 DIAGNOSIS — N319 Neuromuscular dysfunction of bladder, unspecified: Secondary | ICD-10-CM | POA: Diagnosis not present

## 2023-03-22 DIAGNOSIS — Z9181 History of falling: Secondary | ICD-10-CM | POA: Diagnosis not present

## 2023-03-22 DIAGNOSIS — I10 Essential (primary) hypertension: Secondary | ICD-10-CM | POA: Diagnosis not present

## 2023-03-22 DIAGNOSIS — Z9359 Other cystostomy status: Secondary | ICD-10-CM | POA: Diagnosis not present

## 2023-03-22 DIAGNOSIS — E785 Hyperlipidemia, unspecified: Secondary | ICD-10-CM | POA: Diagnosis not present

## 2023-03-22 DIAGNOSIS — Z466 Encounter for fitting and adjustment of urinary device: Secondary | ICD-10-CM | POA: Diagnosis not present

## 2023-03-22 DIAGNOSIS — Z7982 Long term (current) use of aspirin: Secondary | ICD-10-CM | POA: Diagnosis not present

## 2023-03-25 DIAGNOSIS — F419 Anxiety disorder, unspecified: Secondary | ICD-10-CM | POA: Diagnosis not present

## 2023-03-25 DIAGNOSIS — Z7982 Long term (current) use of aspirin: Secondary | ICD-10-CM | POA: Diagnosis not present

## 2023-03-25 DIAGNOSIS — Z9359 Other cystostomy status: Secondary | ICD-10-CM | POA: Diagnosis not present

## 2023-03-25 DIAGNOSIS — I4891 Unspecified atrial fibrillation: Secondary | ICD-10-CM | POA: Diagnosis not present

## 2023-03-25 DIAGNOSIS — I10 Essential (primary) hypertension: Secondary | ICD-10-CM | POA: Diagnosis not present

## 2023-03-25 DIAGNOSIS — E785 Hyperlipidemia, unspecified: Secondary | ICD-10-CM | POA: Diagnosis not present

## 2023-03-25 DIAGNOSIS — F32A Depression, unspecified: Secondary | ICD-10-CM | POA: Diagnosis not present

## 2023-03-25 DIAGNOSIS — Z9181 History of falling: Secondary | ICD-10-CM | POA: Diagnosis not present

## 2023-03-25 DIAGNOSIS — Z466 Encounter for fitting and adjustment of urinary device: Secondary | ICD-10-CM | POA: Diagnosis not present

## 2023-03-25 DIAGNOSIS — N138 Other obstructive and reflux uropathy: Secondary | ICD-10-CM | POA: Diagnosis not present

## 2023-03-25 DIAGNOSIS — E538 Deficiency of other specified B group vitamins: Secondary | ICD-10-CM | POA: Diagnosis not present

## 2023-03-25 DIAGNOSIS — N319 Neuromuscular dysfunction of bladder, unspecified: Secondary | ICD-10-CM | POA: Diagnosis not present

## 2023-03-25 DIAGNOSIS — N401 Enlarged prostate with lower urinary tract symptoms: Secondary | ICD-10-CM | POA: Diagnosis not present

## 2023-03-25 DIAGNOSIS — Z7901 Long term (current) use of anticoagulants: Secondary | ICD-10-CM | POA: Diagnosis not present

## 2023-03-25 DIAGNOSIS — M1990 Unspecified osteoarthritis, unspecified site: Secondary | ICD-10-CM | POA: Diagnosis not present

## 2023-03-26 DIAGNOSIS — M1990 Unspecified osteoarthritis, unspecified site: Secondary | ICD-10-CM | POA: Diagnosis not present

## 2023-03-26 DIAGNOSIS — E785 Hyperlipidemia, unspecified: Secondary | ICD-10-CM | POA: Diagnosis not present

## 2023-03-26 DIAGNOSIS — F419 Anxiety disorder, unspecified: Secondary | ICD-10-CM | POA: Diagnosis not present

## 2023-03-26 DIAGNOSIS — Z7982 Long term (current) use of aspirin: Secondary | ICD-10-CM | POA: Diagnosis not present

## 2023-03-26 DIAGNOSIS — I4891 Unspecified atrial fibrillation: Secondary | ICD-10-CM | POA: Diagnosis not present

## 2023-03-26 DIAGNOSIS — I10 Essential (primary) hypertension: Secondary | ICD-10-CM | POA: Diagnosis not present

## 2023-03-26 DIAGNOSIS — Z466 Encounter for fitting and adjustment of urinary device: Secondary | ICD-10-CM | POA: Diagnosis not present

## 2023-03-26 DIAGNOSIS — F32A Depression, unspecified: Secondary | ICD-10-CM | POA: Diagnosis not present

## 2023-03-26 DIAGNOSIS — N401 Enlarged prostate with lower urinary tract symptoms: Secondary | ICD-10-CM | POA: Diagnosis not present

## 2023-03-26 DIAGNOSIS — E538 Deficiency of other specified B group vitamins: Secondary | ICD-10-CM | POA: Diagnosis not present

## 2023-03-26 DIAGNOSIS — N319 Neuromuscular dysfunction of bladder, unspecified: Secondary | ICD-10-CM | POA: Diagnosis not present

## 2023-03-26 DIAGNOSIS — Z9181 History of falling: Secondary | ICD-10-CM | POA: Diagnosis not present

## 2023-03-26 DIAGNOSIS — Z7901 Long term (current) use of anticoagulants: Secondary | ICD-10-CM | POA: Diagnosis not present

## 2023-03-26 DIAGNOSIS — Z9359 Other cystostomy status: Secondary | ICD-10-CM | POA: Diagnosis not present

## 2023-03-26 DIAGNOSIS — N138 Other obstructive and reflux uropathy: Secondary | ICD-10-CM | POA: Diagnosis not present

## 2023-03-27 ENCOUNTER — Encounter: Payer: Self-pay | Admitting: Pulmonary Disease

## 2023-03-27 DIAGNOSIS — E538 Deficiency of other specified B group vitamins: Secondary | ICD-10-CM | POA: Diagnosis not present

## 2023-03-27 DIAGNOSIS — Z9359 Other cystostomy status: Secondary | ICD-10-CM | POA: Diagnosis not present

## 2023-03-27 DIAGNOSIS — Z466 Encounter for fitting and adjustment of urinary device: Secondary | ICD-10-CM | POA: Diagnosis not present

## 2023-03-27 DIAGNOSIS — N319 Neuromuscular dysfunction of bladder, unspecified: Secondary | ICD-10-CM | POA: Diagnosis not present

## 2023-03-27 DIAGNOSIS — Z7901 Long term (current) use of anticoagulants: Secondary | ICD-10-CM | POA: Diagnosis not present

## 2023-03-27 DIAGNOSIS — N138 Other obstructive and reflux uropathy: Secondary | ICD-10-CM | POA: Diagnosis not present

## 2023-03-27 DIAGNOSIS — F419 Anxiety disorder, unspecified: Secondary | ICD-10-CM | POA: Diagnosis not present

## 2023-03-27 DIAGNOSIS — I4891 Unspecified atrial fibrillation: Secondary | ICD-10-CM | POA: Diagnosis not present

## 2023-03-27 DIAGNOSIS — I10 Essential (primary) hypertension: Secondary | ICD-10-CM | POA: Diagnosis not present

## 2023-03-27 DIAGNOSIS — F32A Depression, unspecified: Secondary | ICD-10-CM | POA: Diagnosis not present

## 2023-03-27 DIAGNOSIS — N401 Enlarged prostate with lower urinary tract symptoms: Secondary | ICD-10-CM | POA: Diagnosis not present

## 2023-03-27 DIAGNOSIS — M1990 Unspecified osteoarthritis, unspecified site: Secondary | ICD-10-CM | POA: Diagnosis not present

## 2023-03-27 DIAGNOSIS — Z9181 History of falling: Secondary | ICD-10-CM | POA: Diagnosis not present

## 2023-03-27 DIAGNOSIS — E785 Hyperlipidemia, unspecified: Secondary | ICD-10-CM | POA: Diagnosis not present

## 2023-03-27 DIAGNOSIS — Z7982 Long term (current) use of aspirin: Secondary | ICD-10-CM | POA: Diagnosis not present

## 2023-03-31 ENCOUNTER — Other Ambulatory Visit: Payer: Self-pay

## 2023-03-31 ENCOUNTER — Emergency Department (HOSPITAL_COMMUNITY)
Admission: EM | Admit: 2023-03-31 | Discharge: 2023-03-31 | Disposition: A | Discharge status: Home / Self Care | Payer: Medicare HMO | Attending: Emergency Medicine | Admitting: Emergency Medicine

## 2023-03-31 DIAGNOSIS — Z7901 Long term (current) use of anticoagulants: Secondary | ICD-10-CM | POA: Diagnosis not present

## 2023-03-31 DIAGNOSIS — T83091A Other mechanical complication of indwelling urethral catheter, initial encounter: Secondary | ICD-10-CM | POA: Diagnosis not present

## 2023-03-31 DIAGNOSIS — I10 Essential (primary) hypertension: Secondary | ICD-10-CM | POA: Insufficient documentation

## 2023-03-31 DIAGNOSIS — Z743 Need for continuous supervision: Secondary | ICD-10-CM | POA: Diagnosis not present

## 2023-03-31 NOTE — ED Triage Notes (Signed)
Pt BIB EMS c/o of catheter not draining. Having abdominal pain 9/10 with Nausea. Has issues with catheter 1-2/month. Has not taking anything for pain. Pt HOH.   BP 170/90, HR 110, Spo2 95% RA, RR 20

## 2023-03-31 NOTE — ED Provider Notes (Signed)
[place] EMERGENCY DEPARTMENT AT [facility] Provider Note   CSN: [id number] Arrival date & time: 03/31/23  4098     History  Chief complaint: Malfunctioning suprapubic catheter  Calvin [name] is a 77 y.o. male.  The history is provided by the patient.   He has history of hypertension, stroke, neurogenic bladder, paroxysmal atrial fibrillation anticoagulated on rivaroxaban and has a suprapubic catheter which is stopped draining yesterday.  He states that he has a catheter changed periodically, but he frequently has problems with catheter occluding and needing to come to the emergency department.   Home Medications Prior to Admission medications   Medication Sig Start Date End Date Taking? Authorizing Provider  albuterol (VENTOLIN HFA) 108 (90 Base) MCG/ACT inhaler Inhale 2 puffs into the lungs every 4 (four) hours as needed for wheezing or shortness of breath. 03/15/23   [name], MD  amoxicillin-clavulanate (AUGMENTIN) 875-125 MG tablet Take 1 tablet by mouth every 12 (twelve) hours. 03/04/23   Lonell Grandchild, MD  atorvastatin (LIPITOR) 20 MG tablet Take 1 tablet (20 mg total) by mouth daily. 03/15/23   [name], MD  cyanocobalamin (VITAMIN B12) 1000 MCG/ML injection INJECT 1 ML (1,000 MCG TOTAL) INTO THE MUSCLE ONCE A WEEK. 03/13/22   [name], MD  cyclobenzaprine (FLEXERIL) 10 MG tablet Take 1 tablet (10 mg total) by mouth 3 (three) times daily as needed for muscle spasms. 12/14/22   [name], MD  diltiazem (CARDIZEM CD) 180 MG 24 hr capsule Take 1 capsule (180 mg total) by mouth daily. 01/07/23   Lewie Chamber, MD  docusate sodium (COLACE) 100 MG capsule Take 100 mg by mouth every 12 (twelve) hours as needed.    [provider]  finasteride (PROSCAR) 5 MG tablet TAKE 1 TABLET (5 MG TOTAL) BY MOUTH DAILY. 03/04/23   [name], MD  fluticasone (FLONASE) 50 MCG/ACT nasal spray Place 1 spray into both nostrils daily. 10/10/22   Omar Person, MD  folic acid (FOLVITE) 1 MG tablet Take 1 tablet (1 mg total) by mouth daily. 12/14/22   [name], MD  furosemide (LASIX) 20 MG tablet Take 1 tablet (20 mg total) by mouth daily. 12/14/22   [name], MD  HYDROcodone bit-homatropine (HYCODAN) 5-1.5 MG/5ML syrup Take 5 mLs by mouth every 4 (four) hours as needed. 03/15/23   [name], MD  hydrocortisone cream-nystatin cream-zinc oxide Apply topically. Not sure the name of the cream- Uses twice daily for wound care    [provider]  KLOR-CON M10 10 MEQ tablet Take 1 tablet (10 mEq total) by mouth 2 (two) times daily. 12/14/22   [name], MD  loperamide (IMODIUM) 2 MG capsule Take 1 capsule (2 mg total) by mouth every 8 (eight) hours as needed for diarrhea or loose stools. 12/14/22   [name], MD  melatonin 3 MG TABS tablet Take 3 mg by mouth at bedtime.    [provider]  Multiple Vitamin (MULTIVITAMIN WITH MINERALS) TABS tablet Take 1 tablet by mouth daily. 06/01/22   Marguerita Merles Latif, DO  ondansetron (ZOFRAN) 4 MG tablet Take 1 tablet (4 mg total) by mouth every 8 (eight) hours as needed for nausea or vomiting. 12/14/22   [name], MD  potassium chloride (KLOR-CON) 10 MEQ tablet TAKE 1 TABLET BY MOUTH 2 TIMES DAILY. 03/11/23   [name], MD  rivaroxaban (XARELTO) 20 MG TABS tablet Take  1 tablet (20 mg total) by mouth daily. 03/15/23   [name], MD  sertraline (ZOLOFT) 100 MG tablet TAKE 1 TABLET BY MOUTH EVERY DAY 03/04/23   [name], MD  SYRINGE-NEEDLE, DISP, 3 ML (BD ECLIPSE SYRINGE/NEEDLE) 25G X 5/8" 3 ML MISC Use as directed once a week 12/14/22   [name], MD  tamsulosin (FLOMAX) 0.4 MG CAPS capsule TAKE 1 CAPSULE BY MOUTH EVERY DAY 12/14/22   [name], MD  thiamine (VITAMIN B-1) 100 MG tablet Take 1 tablet (100 mg total) by mouth daily. 12/14/22   [name], MD      Allergies    Patient has no known allergies.    Review of Systems   Review of  Systems  All other systems reviewed and are negative.   Physical Exam Updated Vital Signs BP (!) 190/129 (BP Location: Left Arm)   Pulse (!) 114   Temp 97.8 F (36.6 C) (Oral)   Resp (!) 22   Ht 6' (1.829 m)   Wt 70.3 kg   SpO2 94%   BMI 21.02 kg/m  Physical Exam Vitals and nursing note reviewed.   77 year old male, resting comfortably and in no acute distress. Vital signs are significant for elevated heart rate, respiratory rate, blood pressure. Oxygen saturation is 94%, which is normal. Head is normocephalic and atraumatic.  Lungs are clear without rales, wheezes, or rhonchi. Chest is nontender. Heart has regular rate and rhythm without murmur. Abdomen is soft, flat, nontender.  Bladder is moderately distended.  Suprapubic catheter is in place.  ED Results / Procedures / Treatments   Labs (all labs ordered are listed, but only abnormal results are displayed) Labs Reviewed - No data to display  EKG None  Radiology No results found.  Procedures SUPRAPUBIC TUBE PLACEMENT  Date/Time: 03/31/2023 5:41 AM  Performed by: Dione Booze, MD Authorized by: Dione Booze, MD   Consent:    Consent given by:  Patient   Risks, benefits, and alternatives were discussed: yes     Risks discussed:  Bleeding and pain   Alternatives discussed:  No treatment Universal protocol:    Procedure explained and questions answered to patient or proxy's satisfaction: yes     Relevant documents present and verified: yes     Required blood products, implants, devices, and special equipment available: yes     Site/side marked: yes     Immediately prior to procedure, a time out was called: yes     Patient identity confirmed:  Verbally with patient and arm band Sedation:    Sedation type:  None Anesthesia:    Anesthesia method:  None Procedure details:    Complexity:  Simple   Catheter type:  Foley   Catheter size:  18 Fr   Ultrasound guidance: no     Number of attempts:  1   Urine  characteristics:  Clear Post-procedure details:    Procedure completion:  Tolerated well, no immediate complications     Medications Ordered in ED Medications - No data to display  ED Course/ Medical Decision Making/ A&P                                 Medical Decision Making  Malfunctioning suprapubic catheter.  I have pulled his old catheter and placed a new one which is draining well.  I have reviewed his past records, and note for prior  ED visits for urinary catheter dysfunction, most recently on 03/04/2023.  I have told the patient that he would need to discuss with his urologist possible placement of a larger catheter (current catheter is [address] [place]).  I suspect his abnormal vital signs were mainly related to discomfort with urinary retention from occluded catheter.  Vital signs were repeated following placement of new catheter, and blood pressure and heart rate have both come down substantially.  Final Clinical Impression(s) / ED Diagnoses Final diagnoses:  Obstruction of indwelling urinary catheter, initial encounter (HCC)  Chronic anticoagulation  Elevated blood pressure reading with diagnosis of hypertension    Rx / DC Orders ED Discharge Orders     None         Dione Booze, MD 03/31/23 [phone]

## 2023-04-01 ENCOUNTER — Telehealth: Payer: Self-pay | Admitting: Neurology

## 2023-04-01 DIAGNOSIS — I10 Essential (primary) hypertension: Secondary | ICD-10-CM | POA: Diagnosis not present

## 2023-04-01 DIAGNOSIS — N319 Neuromuscular dysfunction of bladder, unspecified: Secondary | ICD-10-CM | POA: Diagnosis not present

## 2023-04-01 DIAGNOSIS — Z9181 History of falling: Secondary | ICD-10-CM | POA: Diagnosis not present

## 2023-04-01 DIAGNOSIS — N401 Enlarged prostate with lower urinary tract symptoms: Secondary | ICD-10-CM | POA: Diagnosis not present

## 2023-04-01 DIAGNOSIS — M1990 Unspecified osteoarthritis, unspecified site: Secondary | ICD-10-CM | POA: Diagnosis not present

## 2023-04-01 DIAGNOSIS — E538 Deficiency of other specified B group vitamins: Secondary | ICD-10-CM | POA: Diagnosis not present

## 2023-04-01 DIAGNOSIS — N138 Other obstructive and reflux uropathy: Secondary | ICD-10-CM | POA: Diagnosis not present

## 2023-04-01 DIAGNOSIS — F419 Anxiety disorder, unspecified: Secondary | ICD-10-CM | POA: Diagnosis not present

## 2023-04-01 DIAGNOSIS — E785 Hyperlipidemia, unspecified: Secondary | ICD-10-CM | POA: Diagnosis not present

## 2023-04-01 DIAGNOSIS — Z9359 Other cystostomy status: Secondary | ICD-10-CM | POA: Diagnosis not present

## 2023-04-01 DIAGNOSIS — Z7901 Long term (current) use of anticoagulants: Secondary | ICD-10-CM | POA: Diagnosis not present

## 2023-04-01 DIAGNOSIS — I4891 Unspecified atrial fibrillation: Secondary | ICD-10-CM | POA: Diagnosis not present

## 2023-04-01 DIAGNOSIS — Z466 Encounter for fitting and adjustment of urinary device: Secondary | ICD-10-CM | POA: Diagnosis not present

## 2023-04-01 DIAGNOSIS — F32A Depression, unspecified: Secondary | ICD-10-CM | POA: Diagnosis not present

## 2023-04-01 DIAGNOSIS — Z7982 Long term (current) use of aspirin: Secondary | ICD-10-CM | POA: Diagnosis not present

## 2023-04-01 NOTE — Telephone Encounter (Signed)
Pt's daughter called in stating she got disconnected from someone.

## 2023-04-01 NOTE — Telephone Encounter (Signed)
Left message on voicemail, unsure whom called.

## 2023-04-01 NOTE — Telephone Encounter (Signed)
I advised to wife, she hung up on me that noone called that I could see.

## 2023-04-01 NOTE — Telephone Encounter (Signed)
Patient's Daughter called stating that she was calling because she had a missed call

## 2023-04-04 DIAGNOSIS — N312 Flaccid neuropathic bladder, not elsewhere classified: Secondary | ICD-10-CM | POA: Diagnosis not present

## 2023-04-05 DIAGNOSIS — N138 Other obstructive and reflux uropathy: Secondary | ICD-10-CM | POA: Diagnosis not present

## 2023-04-05 DIAGNOSIS — M1990 Unspecified osteoarthritis, unspecified site: Secondary | ICD-10-CM | POA: Diagnosis not present

## 2023-04-05 DIAGNOSIS — Z9359 Other cystostomy status: Secondary | ICD-10-CM | POA: Diagnosis not present

## 2023-04-05 DIAGNOSIS — N401 Enlarged prostate with lower urinary tract symptoms: Secondary | ICD-10-CM | POA: Diagnosis not present

## 2023-04-05 DIAGNOSIS — Z7901 Long term (current) use of anticoagulants: Secondary | ICD-10-CM | POA: Diagnosis not present

## 2023-04-05 DIAGNOSIS — F419 Anxiety disorder, unspecified: Secondary | ICD-10-CM | POA: Diagnosis not present

## 2023-04-05 DIAGNOSIS — Z7982 Long term (current) use of aspirin: Secondary | ICD-10-CM | POA: Diagnosis not present

## 2023-04-05 DIAGNOSIS — F32A Depression, unspecified: Secondary | ICD-10-CM | POA: Diagnosis not present

## 2023-04-05 DIAGNOSIS — E538 Deficiency of other specified B group vitamins: Secondary | ICD-10-CM | POA: Diagnosis not present

## 2023-04-05 DIAGNOSIS — Z9181 History of falling: Secondary | ICD-10-CM | POA: Diagnosis not present

## 2023-04-05 DIAGNOSIS — I10 Essential (primary) hypertension: Secondary | ICD-10-CM | POA: Diagnosis not present

## 2023-04-05 DIAGNOSIS — E785 Hyperlipidemia, unspecified: Secondary | ICD-10-CM | POA: Diagnosis not present

## 2023-04-05 DIAGNOSIS — I4891 Unspecified atrial fibrillation: Secondary | ICD-10-CM | POA: Diagnosis not present

## 2023-04-05 DIAGNOSIS — N319 Neuromuscular dysfunction of bladder, unspecified: Secondary | ICD-10-CM | POA: Diagnosis not present

## 2023-04-05 DIAGNOSIS — Z466 Encounter for fitting and adjustment of urinary device: Secondary | ICD-10-CM | POA: Diagnosis not present

## 2023-04-08 ENCOUNTER — Ambulatory Visit: Payer: Medicare HMO | Admitting: Family Medicine

## 2023-04-09 DIAGNOSIS — I4891 Unspecified atrial fibrillation: Secondary | ICD-10-CM | POA: Diagnosis not present

## 2023-04-10 ENCOUNTER — Inpatient Hospital Stay (HOSPITAL_COMMUNITY): Payer: Medicare HMO

## 2023-04-10 ENCOUNTER — Telehealth: Payer: Self-pay | Admitting: Family Medicine

## 2023-04-10 ENCOUNTER — Other Ambulatory Visit: Payer: Medicare HMO

## 2023-04-10 ENCOUNTER — Inpatient Hospital Stay (HOSPITAL_COMMUNITY)
Admission: EM | Admit: 2023-04-10 | Discharge: 2023-04-18 | DRG: 698 | Disposition: A | Discharge status: Skilled Nursing Facility | Payer: Medicare HMO | Attending: Internal Medicine | Admitting: Internal Medicine

## 2023-04-10 ENCOUNTER — Other Ambulatory Visit: Payer: Self-pay

## 2023-04-10 ENCOUNTER — Encounter (HOSPITAL_COMMUNITY): Payer: Self-pay

## 2023-04-10 DIAGNOSIS — N390 Urinary tract infection, site not specified: Secondary | ICD-10-CM | POA: Diagnosis not present

## 2023-04-10 DIAGNOSIS — F1010 Alcohol abuse, uncomplicated: Secondary | ICD-10-CM | POA: Diagnosis present

## 2023-04-10 DIAGNOSIS — T83510A Infection and inflammatory reaction due to cystostomy catheter, initial encounter: Secondary | ICD-10-CM | POA: Diagnosis not present

## 2023-04-10 DIAGNOSIS — M6289 Other specified disorders of muscle: Secondary | ICD-10-CM | POA: Diagnosis not present

## 2023-04-10 DIAGNOSIS — Z9359 Other cystostomy status: Secondary | ICD-10-CM | POA: Diagnosis not present

## 2023-04-10 DIAGNOSIS — Z8616 Personal history of COVID-19: Secondary | ICD-10-CM | POA: Diagnosis not present

## 2023-04-10 DIAGNOSIS — I4811 Longstanding persistent atrial fibrillation: Secondary | ICD-10-CM | POA: Diagnosis not present

## 2023-04-10 DIAGNOSIS — E538 Deficiency of other specified B group vitamins: Secondary | ICD-10-CM | POA: Diagnosis not present

## 2023-04-10 DIAGNOSIS — T83511D Infection and inflammatory reaction due to indwelling urethral catheter, subsequent encounter: Secondary | ICD-10-CM | POA: Diagnosis not present

## 2023-04-10 DIAGNOSIS — K828 Other specified diseases of gallbladder: Secondary | ICD-10-CM | POA: Diagnosis not present

## 2023-04-10 DIAGNOSIS — Z7901 Long term (current) use of anticoagulants: Secondary | ICD-10-CM | POA: Diagnosis not present

## 2023-04-10 DIAGNOSIS — M6281 Muscle weakness (generalized): Secondary | ICD-10-CM | POA: Diagnosis not present

## 2023-04-10 DIAGNOSIS — J449 Chronic obstructive pulmonary disease, unspecified: Secondary | ICD-10-CM | POA: Diagnosis not present

## 2023-04-10 DIAGNOSIS — Z8249 Family history of ischemic heart disease and other diseases of the circulatory system: Secondary | ICD-10-CM | POA: Diagnosis not present

## 2023-04-10 DIAGNOSIS — G9341 Metabolic encephalopathy: Secondary | ICD-10-CM | POA: Diagnosis present

## 2023-04-10 DIAGNOSIS — F4323 Adjustment disorder with mixed anxiety and depressed mood: Secondary | ICD-10-CM | POA: Diagnosis not present

## 2023-04-10 DIAGNOSIS — Z7982 Long term (current) use of aspirin: Secondary | ICD-10-CM | POA: Diagnosis not present

## 2023-04-10 DIAGNOSIS — F32A Depression, unspecified: Secondary | ICD-10-CM | POA: Diagnosis not present

## 2023-04-10 DIAGNOSIS — N3000 Acute cystitis without hematuria: Secondary | ICD-10-CM | POA: Diagnosis not present

## 2023-04-10 DIAGNOSIS — N281 Cyst of kidney, acquired: Secondary | ICD-10-CM | POA: Diagnosis not present

## 2023-04-10 DIAGNOSIS — Y738 Miscellaneous gastroenterology and urology devices associated with adverse incidents, not elsewhere classified: Secondary | ICD-10-CM | POA: Diagnosis not present

## 2023-04-10 DIAGNOSIS — R1030 Lower abdominal pain, unspecified: Secondary | ICD-10-CM | POA: Diagnosis not present

## 2023-04-10 DIAGNOSIS — E782 Mixed hyperlipidemia: Secondary | ICD-10-CM | POA: Diagnosis present

## 2023-04-10 DIAGNOSIS — N320 Bladder-neck obstruction: Secondary | ICD-10-CM | POA: Diagnosis not present

## 2023-04-10 DIAGNOSIS — I4891 Unspecified atrial fibrillation: Secondary | ICD-10-CM | POA: Diagnosis not present

## 2023-04-10 DIAGNOSIS — T83090A Other mechanical complication of cystostomy catheter, initial encounter: Secondary | ICD-10-CM | POA: Diagnosis not present

## 2023-04-10 DIAGNOSIS — R278 Other lack of coordination: Secondary | ICD-10-CM | POA: Diagnosis not present

## 2023-04-10 DIAGNOSIS — I10 Essential (primary) hypertension: Secondary | ICD-10-CM | POA: Diagnosis present

## 2023-04-10 DIAGNOSIS — N400 Enlarged prostate without lower urinary tract symptoms: Secondary | ICD-10-CM | POA: Diagnosis present

## 2023-04-10 DIAGNOSIS — B952 Enterococcus as the cause of diseases classified elsewhere: Secondary | ICD-10-CM | POA: Diagnosis present

## 2023-04-10 DIAGNOSIS — Z743 Need for continuous supervision: Secondary | ICD-10-CM | POA: Diagnosis not present

## 2023-04-10 DIAGNOSIS — Z7401 Bed confinement status: Secondary | ICD-10-CM | POA: Diagnosis not present

## 2023-04-10 DIAGNOSIS — N138 Other obstructive and reflux uropathy: Secondary | ICD-10-CM | POA: Diagnosis not present

## 2023-04-10 DIAGNOSIS — Z79899 Other long term (current) drug therapy: Secondary | ICD-10-CM

## 2023-04-10 DIAGNOSIS — Z8673 Personal history of transient ischemic attack (TIA), and cerebral infarction without residual deficits: Secondary | ICD-10-CM

## 2023-04-10 DIAGNOSIS — N311 Reflex neuropathic bladder, not elsewhere classified: Secondary | ICD-10-CM | POA: Diagnosis not present

## 2023-04-10 DIAGNOSIS — Z6834 Body mass index (BMI) 34.0-34.9, adult: Secondary | ICD-10-CM | POA: Diagnosis not present

## 2023-04-10 DIAGNOSIS — R234 Changes in skin texture: Secondary | ICD-10-CM | POA: Diagnosis present

## 2023-04-10 DIAGNOSIS — Z978 Presence of other specified devices: Secondary | ICD-10-CM | POA: Diagnosis present

## 2023-04-10 DIAGNOSIS — E785 Hyperlipidemia, unspecified: Secondary | ICD-10-CM | POA: Diagnosis not present

## 2023-04-10 DIAGNOSIS — G928 Other toxic encephalopathy: Secondary | ICD-10-CM | POA: Diagnosis not present

## 2023-04-10 DIAGNOSIS — N319 Neuromuscular dysfunction of bladder, unspecified: Secondary | ICD-10-CM | POA: Diagnosis not present

## 2023-04-10 DIAGNOSIS — R109 Unspecified abdominal pain: Secondary | ICD-10-CM | POA: Diagnosis not present

## 2023-04-10 DIAGNOSIS — E441 Mild protein-calorie malnutrition: Secondary | ICD-10-CM | POA: Diagnosis not present

## 2023-04-10 DIAGNOSIS — Z1623 Resistance to quinolones and fluoroquinolones: Secondary | ICD-10-CM | POA: Diagnosis present

## 2023-04-10 DIAGNOSIS — E873 Alkalosis: Secondary | ICD-10-CM | POA: Diagnosis present

## 2023-04-10 DIAGNOSIS — R509 Fever, unspecified: Secondary | ICD-10-CM | POA: Diagnosis not present

## 2023-04-10 DIAGNOSIS — R2689 Other abnormalities of gait and mobility: Secondary | ICD-10-CM | POA: Diagnosis not present

## 2023-04-10 DIAGNOSIS — Z87440 Personal history of urinary (tract) infections: Secondary | ICD-10-CM

## 2023-04-10 DIAGNOSIS — Z823 Family history of stroke: Secondary | ICD-10-CM

## 2023-04-10 DIAGNOSIS — Z466 Encounter for fitting and adjustment of urinary device: Secondary | ICD-10-CM | POA: Diagnosis not present

## 2023-04-10 DIAGNOSIS — T424X5A Adverse effect of benzodiazepines, initial encounter: Secondary | ICD-10-CM | POA: Diagnosis not present

## 2023-04-10 DIAGNOSIS — L03311 Cellulitis of abdominal wall: Secondary | ICD-10-CM | POA: Diagnosis present

## 2023-04-10 DIAGNOSIS — Z8042 Family history of malignant neoplasm of prostate: Secondary | ICD-10-CM

## 2023-04-10 DIAGNOSIS — R5381 Other malaise: Secondary | ICD-10-CM | POA: Diagnosis present

## 2023-04-10 DIAGNOSIS — R300 Dysuria: Secondary | ICD-10-CM

## 2023-04-10 DIAGNOSIS — E66811 Obesity, class 1: Secondary | ICD-10-CM | POA: Diagnosis present

## 2023-04-10 DIAGNOSIS — T83098A Other mechanical complication of other indwelling urethral catheter, initial encounter: Secondary | ICD-10-CM | POA: Diagnosis not present

## 2023-04-10 DIAGNOSIS — N401 Enlarged prostate with lower urinary tract symptoms: Secondary | ICD-10-CM | POA: Diagnosis not present

## 2023-04-10 DIAGNOSIS — L0390 Cellulitis, unspecified: Secondary | ICD-10-CM | POA: Insufficient documentation

## 2023-04-10 DIAGNOSIS — K5730 Diverticulosis of large intestine without perforation or abscess without bleeding: Secondary | ICD-10-CM | POA: Diagnosis not present

## 2023-04-10 DIAGNOSIS — M1990 Unspecified osteoarthritis, unspecified site: Secondary | ICD-10-CM | POA: Diagnosis not present

## 2023-04-10 DIAGNOSIS — Z9181 History of falling: Secondary | ICD-10-CM | POA: Diagnosis not present

## 2023-04-10 DIAGNOSIS — F419 Anxiety disorder, unspecified: Secondary | ICD-10-CM | POA: Diagnosis not present

## 2023-04-10 DIAGNOSIS — R1319 Other dysphagia: Secondary | ICD-10-CM | POA: Diagnosis not present

## 2023-04-10 LAB — BASIC METABOLIC PANEL
Anion gap: 11 (ref 5–15)
BUN: 19 mg/dL (ref 8–23)
CO2: 21 mmol/L — ABNORMAL LOW (ref 22–32)
Calcium: 8.9 mg/dL (ref 8.9–10.3)
Chloride: 103 mmol/L (ref 98–111)
Creatinine, Ser: 0.99 mg/dL (ref 0.61–1.24)
GFR, Estimated: >60 mL/min (ref >60)
Glucose, Bld: 145 mg/dL — ABNORMAL HIGH (ref 70–99)
Potassium: 3.8 mmol/L (ref 3.5–5.1)
Sodium: 135 mmol/L (ref 135–145)

## 2023-04-10 LAB — CBC WITH DIFFERENTIAL/PLATELET
Abs Immature Granulocytes: 0.07 10*3/uL (ref 0.00–0.07)
Basophils Absolute: 0 10*3/uL (ref 0.0–0.1)
Basophils Relative: 0 %
Eosinophils Absolute: 0.2 10*3/uL (ref 0.0–0.5)
Eosinophils Relative: 2 %
HCT: 41.1 % (ref 39.0–52.0)
Hemoglobin: 14.1 g/dL (ref 13.0–17.0)
Immature Granulocytes: 1 %
Lymphocytes Relative: 11 %
Lymphs Abs: 1.2 10*3/uL (ref 0.7–4.0)
MCH: 31.3 pg (ref 26.0–34.0)
MCHC: 34.3 g/dL (ref 30.0–36.0)
MCV: 91.1 fL (ref 80.0–100.0)
Monocytes Absolute: 1.2 10*3/uL — ABNORMAL HIGH (ref 0.1–1.0)
Monocytes Relative: 11 %
Neutro Abs: 8.2 10*3/uL — ABNORMAL HIGH (ref 1.7–7.7)
Neutrophils Relative %: 75 %
Platelets: 272 10*3/uL (ref 150–400)
RBC: 4.51 MIL/uL (ref 4.22–5.81)
RDW: 13.3 % (ref 11.5–15.5)
WBC: 10.9 10*3/uL — ABNORMAL HIGH (ref 4.0–10.5)
nRBC: 0 % (ref 0.0–0.2)

## 2023-04-10 LAB — URINALYSIS, W/ REFLEX TO CULTURE (INFECTION SUSPECTED)
Bilirubin Urine: NEGATIVE
Glucose, UA: NEGATIVE mg/dL
Ketones, ur: NEGATIVE mg/dL
Nitrite: NEGATIVE
Protein, ur: 100 mg/dL — AB
Specific Gravity, Urine: 1.012 (ref 1.005–1.030)
WBC, UA: >50 WBC/hpf (ref 0–5)
pH: 8 (ref 5.0–8.0)

## 2023-04-10 LAB — BLOOD GAS, VENOUS
Acid-Base Excess: 0.8 mmol/L (ref 0.0–2.0)
Bicarbonate: 26 mmol/L (ref 20.0–28.0)
O2 Saturation: 83.7 %
Patient temperature: 37
pCO2, Ven: 43 mm[Hg] — ABNORMAL LOW (ref 44–60)
pH, Ven: 7.39 (ref 7.25–7.43)
pO2, Ven: 51 mm[Hg] — ABNORMAL HIGH (ref 32–45)

## 2023-04-10 LAB — LACTIC ACID, PLASMA: Lactic Acid, Venous: 1.2 mmol/L (ref 0.5–1.9)

## 2023-04-10 LAB — POC URINALSYSI DIPSTICK (AUTOMATED)
Bilirubin, UA: NEGATIVE
Glucose, UA: NEGATIVE
Ketones, UA: NEGATIVE
Nitrite, UA: NEGATIVE
Protein, UA: POSITIVE — AB
Spec Grav, UA: <=1.005 — AB (ref 1.010–1.025)
Urobilinogen, UA: 1 U/dL
pH, UA: >=9 — AB (ref 5.0–8.0)

## 2023-04-10 LAB — AMMONIA: Ammonia: 12 umol/L (ref 9–35)

## 2023-04-10 MED ORDER — HYDROMORPHONE HCL 1 MG/ML IJ SOLN
1.0000 mg | Freq: Once | INTRAMUSCULAR | Status: AC
  Administered 2023-04-10: 1 mg via INTRAVENOUS
  Filled 2023-04-10: qty 1

## 2023-04-10 MED ORDER — SODIUM CHLORIDE 0.9 % IV SOLN
2.0000 g | Freq: Three times a day (TID) | INTRAVENOUS | Status: DC
  Administered 2023-04-10 – 2023-04-12 (×5): 2 g via INTRAVENOUS
  Filled 2023-04-10 (×5): qty 12.5

## 2023-04-10 MED ORDER — ALUM & MAG HYDROXIDE-SIMETH 200-200-20 MG/5ML PO SUSP
30.0000 mL | Freq: Once | ORAL | Status: AC
  Administered 2023-04-10: 30 mL via ORAL
  Filled 2023-04-10: qty 30

## 2023-04-10 MED ORDER — VANCOMYCIN HCL 1500 MG/300ML IV SOLN
1500.0000 mg | INTRAVENOUS | Status: DC
  Administered 2023-04-10 – 2023-04-11 (×2): 1500 mg via INTRAVENOUS
  Filled 2023-04-10 (×2): qty 300

## 2023-04-10 MED ORDER — SODIUM CHLORIDE 0.9 % IV SOLN
1.0000 g | Freq: Once | INTRAVENOUS | Status: AC
Start: 2023-04-10 — End: 2023-04-10
  Administered 2023-04-10: 1 g via INTRAVENOUS
  Filled 2023-04-10: qty 10

## 2023-04-10 MED ORDER — IOHEXOL 300 MG/ML  SOLN
100.0000 mL | Freq: Once | INTRAMUSCULAR | Status: AC | PRN
  Administered 2023-04-10: 100 mL via INTRAVENOUS

## 2023-04-10 NOTE — Telephone Encounter (Signed)
[facility] Nurse called to see if Dr can place a Urine order in for pt. Believe pt has a UTI; nurse came by to pick up a urine cup. Would like a call once order is placed.   Please advise.

## 2023-04-10 NOTE — Assessment & Plan Note (Signed)
-   CIWA protocol 

## 2023-04-10 NOTE — ED Notes (Signed)
ED TO INPATIENT HANDOFF REPORT  Name/Age/Gender Calvin [name] 77 y.o. male  Code Status    Code Status Orders  (From admission, onward)           Start     Ordered   04/10/23 2115  Full code  Continuous       Question:  By:  Answer:  Consent: discussion documented in EHR   04/10/23 2117           Code Status History     Date Active Date Inactive Code Status Order ID Comments User Context   01/08/2023 0507 01/19/2023 2305 Full Code [id number]  John Giovanni, MD ED   01/05/2023 1504 01/06/2023 2004 Full Code [id number]  Maryln Gottron, MD ED   10/28/2022 2125 10/29/2022 2027 Full Code [id number]  Frankey Shown, DO Inpatient   05/27/2022 1826 06/04/2022 1632 Full Code [id number]  Synetta Fail, MD ED   03/18/2022 0528 03/22/2022 1757 Full Code [id number]  Briscoe Deutscher, MD ED   02/25/2022 1316 03/02/2022 2253 Full Code [id number]  Emeline General, MD ED   01/19/2016 1417 01/21/2016 1925 Full Code [id number]  Ozella Rocks, MD ED   03/12/2012 2134 03/13/2012 1734 Full Code [id number]  [place], Jenetta Loges, RN Inpatient       Home/SNF/Other Home  Chief Complaint Cellulitis [L03.90]  Level of Care/Admitting Diagnosis ED Disposition     ED Disposition  Admit   Condition  --   Comment  Hospital Area: [facility] [[id number]]  Level of Care: Progressive [102]  Admit to Progressive based on following criteria: MULTISYSTEM THREATS such as stable sepsis, metabolic/electrolyte imbalance with or without encephalopathy that is responding to early treatment.  May admit patient to [facility] or [facility] if equivalent level of care is available:: No  Covid Evaluation: Asymptomatic - no recent exposure (last 10 days) testing not required  Diagnosis: Cellulitis [518841]  Admitting Physician: Therisa Doyne [[id number]]  Attending Physician: Therisa Doyne [[id number]]  Certification:: I certify this patient will need inpatient services for at least 2  midnights  Expected Medical Readiness: 04/12/2023          Medical History Past Medical History:  Diagnosis Date   Anxiety    Benign prostatic hypertrophy    COVID-19 virus infection 02/24/2020   Depression    ED (erectile dysfunction)    Headache(784.0)    History of kidney stones    Hypertension    Hypogonadism male    Low back pain 06/09/2020   Nephrolithiasis    hx of had lithotripsy in 1-10.   NEPHROLITHIASIS, HX OF 10/25/2008   Qualifier: Diagnosis of   By: Clent Ridges MD, Tera Mater      Neuromuscular disorder [facility])    neuropaty legs   Pneumonia    Pyelonephritis 02/25/2022   Stroke [facility])    fall 2023 some memory issues like time of day    Allergies No Known Allergies  IV Location/Drains/Wounds Patient Lines/Drains/Airways Status     Active Line/Drains/Airways     Name Placement date Placement time Site Days   Peripheral IV 04/10/23 20 G 1" Right Antecubital 04/10/23  1816  Antecubital  less than 1   Suprapubic Catheter Latex 18 Fr. 03/31/23  0533  Latex  10   Pressure Injury 05/28/22 Coccyx Medial Stage 1 -  Intact skin with non-blanchable redness of a localized area usually over a bony prominence. 05/28/22  2009  -- 317  Labs/Imaging Results for orders placed or performed during the hospital encounter of 04/10/23 (from the past 48 hour(s))  Basic metabolic panel     Status: Abnormal   Collection Time: 04/10/23  6:14 PM  Result Value Ref Range   Sodium 135 135 - 145 mmol/L   Potassium 3.8 3.5 - 5.1 mmol/L   Chloride 103 98 - 111 mmol/L   CO2 21 (L) 22 - 32 mmol/L   Glucose, Bld 145 (H) 70 - 99 mg/dL    Comment: Glucose reference range applies only to samples taken after fasting for at least 8 hours.   BUN 19 8 - 23 mg/dL   Creatinine, Ser 1.61 0.61 - 1.24 mg/dL   Calcium 8.9 8.9 - 09.6 mg/dL   GFR, Estimated >04 >54 mL/min    Comment: (NOTE) Calculated using the CKD-EPI Creatinine Equation (2021)    Anion gap 11 5 - 15    Comment:  Performed at [facility], [address], [place], [place] [address]  CBC with Differential     Status: Abnormal   Collection Time: 04/10/23  6:14 PM  Result Value Ref Range   WBC 10.9 (H) 4.0 - 10.5 K/uL   RBC 4.51 4.22 - 5.81 MIL/uL   Hemoglobin 14.1 13.0 - 17.0 g/dL   HCT 91.4 78.2 - 95.6 %   MCV 91.1 80.0 - 100.0 fL   MCH 31.3 26.0 - 34.0 pg   MCHC 34.3 30.0 - 36.0 g/dL   RDW 21.3 08.6 - 57.8 %   Platelets 272 150 - 400 K/uL   nRBC 0.0 0.0 - 0.2 %   Neutrophils Relative % 75 %   Neutro Abs 8.2 (H) 1.7 - 7.7 K/uL   Lymphocytes Relative 11 %   Lymphs Abs 1.2 0.7 - 4.0 K/uL   Monocytes Relative 11 %   Monocytes Absolute 1.2 (H) 0.1 - 1.0 K/uL   Eosinophils Relative 2 %   Eosinophils Absolute 0.2 0.0 - 0.5 K/uL   Basophils Relative 0 %   Basophils Absolute 0.0 0.0 - 0.1 K/uL   Immature Granulocytes 1 %   Abs Immature Granulocytes 0.07 0.00 - 0.07 K/uL    Comment: Performed at [facility], [address], [place], [place] [address]  Urinalysis, w/ Reflex to Culture (Infection Suspected) -Urine, Suprapubic     Status: Abnormal   Collection Time: 04/10/23  7:54 PM  Result Value Ref Range   Specimen Source URINE, SUPRAPUBIC    Color, Urine AMBER (A) YELLOW    Comment: BIOCHEMICALS MAY BE AFFECTED BY COLOR   APPearance CLOUDY (A) CLEAR   Specific Gravity, Urine 1.012 1.005 - 1.030   pH 8.0 5.0 - 8.0   Glucose, UA NEGATIVE NEGATIVE mg/dL   Hgb urine dipstick MODERATE (A) NEGATIVE   Bilirubin Urine NEGATIVE NEGATIVE   Ketones, ur NEGATIVE NEGATIVE mg/dL   Protein, ur 952 (A) NEGATIVE mg/dL   Nitrite NEGATIVE NEGATIVE   Leukocytes,Ua LARGE (A) NEGATIVE   RBC / HPF 21-50 0 - 5 RBC/hpf   WBC, UA >50 0 - 5 WBC/hpf    Comment:        Reflex urine culture not performed if WBC <=10, OR if Squamous epithelial cells >5. If Squamous epithelial cells >5 suggest recollection.    Bacteria, UA FEW (A) NONE SEEN   Squamous Epithelial / HPF 0-5 0 -  5 /HPF   Mucus PRESENT     Comment: Performed at [facility], [address], [place], [place] [address]  Ammonia     Status: None   Collection Time: 04/10/23 10:02 PM  Result Value Ref Range   Ammonia 12 9 - 35 umol/L    Comment: Performed at [facility], [address] [place], [place] [address]  Blood gas, venous     Status: Abnormal   Collection Time: 04/10/23 10:02 PM  Result Value Ref Range   pH, Ven 7.39 7.25 - 7.43   pCO2, Ven 43 (L) 44 - 60 mmHg   pO2, Ven 51 (H) 32 - 45 mmHg   Bicarbonate 26.0 20.0 - 28.0 mmol/L   Acid-Base Excess 0.8 0.0 - 2.0 mmol/L   O2 Saturation 83.7 %   Patient temperature 37.0     Comment: Performed at [facility], [address], [place], [place] [address]  Lactic acid, plasma     Status: None   Collection Time: 04/10/23 10:02 PM  Result Value Ref Range   Lactic Acid, Venous 1.2 0.5 - 1.9 mmol/L    Comment: Performed at [facility], [address] [place], [place] [address]   CT ABDOMEN PELVIS W CONTRAST  Result Date: 04/10/2023 CLINICAL DATA:  History of frequent UTIs with decreased drainage from suprapubic catheter, initial encounter EXAM: CT ABDOMEN AND PELVIS WITH CONTRAST TECHNIQUE: Multidetector CT imaging of the abdomen and pelvis was performed using the standard protocol following bolus administration of intravenous contrast. RADIATION DOSE REDUCTION: This exam was performed according to the departmental dose-optimization program which includes automated exposure control, adjustment of the mA and/or kV according to patient size and/or use of iterative reconstruction technique. CONTRAST:  OMNIPAQUE IOHEXOL 300 MG/ML  SOLN COMPARISON:  01/14/2011 FINDINGS: Lower chest: No acute abnormality. Hepatobiliary: Liver shows some hypodensities consistent with simple cysts. These are stable from the prior exam. No follow-up is recommended. The gallbladder is well  distended but otherwise within normal limits. Pancreas: Unremarkable. No pancreatic ductal dilatation or surrounding inflammatory changes. Spleen: Normal in size without focal abnormality. Adrenals/Urinary Tract: Adrenal glands again demonstrate a hypodense right adrenal lesion measuring 2.2 cm. This is stable from multiple previous exams consistent with a benign adenoma. No further follow-up is recommended. Kidneys demonstrate a normal enhancement pattern bilaterally. Renal cysts are noted on the left. These are stable and also need no follow-up. The bladder is decompressed by suprapubic catheter. Inflammatory changes are noted surrounding the bladder which may be related to the known UTI. Stomach/Bowel: Scattered diverticular change of the colon is noted without evidence of diverticulitis. The appendix is within normal limits. Small bowel and stomach are unremarkable. Vascular/Lymphatic: Aortic atherosclerosis. No enlarged abdominal or pelvic lymph nodes. Reproductive: Prostate is enlarged indenting upon the inferior aspect of the bladder. Other: No abdominal wall hernia or abnormality. No abdominopelvic ascites. Musculoskeletal: No acute abnormality in the lumbar spine. IMPRESSION: Mild inflammatory changes surrounding the decompressed bladder. This may be related to the known UTI. Diverticulosis without diverticulitis. Remainder of the exam is stable from the prior study. Electronically Signed   By: Alcide Clever M.D.   On: 04/10/2023 22:58    Pending Labs Unresulted Labs (From admission, onward)     Start     Ordered   04/11/23 0500  Prealbumin  Tomorrow morning,   R        04/10/23 2108   04/10/23 2120  MRSA Next Gen by PCR, Nasal  Once,   R       Question Answer Comment  Patient  immune status Normal   Release to patient Immediate      04/10/23 2119   04/10/23 2109  Ethanol  Add-on,   AD        04/10/23 2108   04/10/23 2109  Procalcitonin  Add-on,   AD       References:    Procalcitonin Lower  Respiratory Tract Infection AND Sepsis Procalcitonin Algorithm   04/10/23 2108   04/10/23 2109  Vitamin B1  Add-on,   AD        04/10/23 2108   04/10/23 2109  CK  Add-on,   AD        04/10/23 2108   04/10/23 2109  Magnesium  Add-on,   AD        04/10/23 2108   04/10/23 2109  Phosphorus  Add-on,   AD        04/10/23 2108   04/10/23 2109  TSH  Add-on,   AD        04/10/23 2108   04/10/23 2109  Lactic acid, plasma  (Lactic Acid)  STAT Now then every 3 hours,   R (with STAT occurrences)      04/10/23 2108   04/10/23 2109  Hepatic function panel  Add-on,   AD        04/10/23 2108   04/10/23 1954  Urine Culture  Once,   R        04/10/23 1954   04/10/23 1803  Culture, blood (routine x 2)  BLOOD CULTURE X 2,   R (with STAT occurrences)      04/10/23 1802   Signed and Held  Magnesium  Tomorrow morning,   R        Signed and Held   Signed and Held  Phosphorus  Tomorrow morning,   R        Signed and Held   Signed and Held  Comprehensive metabolic panel  Tomorrow morning,   R       Question:  Release to patient  Answer:  Immediate   Signed and Held   Signed and Held  CBC  Tomorrow morning,   R       Question:  Release to patient  Answer:  Immediate   Signed and Held   Signed and Held  Comprehensive metabolic panel  Tomorrow morning,   R        Signed and Held   Medical illustrator and Held  Magnesium  Tomorrow morning,   R        Signed and Held   Medical illustrator and Held  Phosphorus  Tomorrow morning,   R        Signed and Held   Signed and Held  CBC  Tomorrow morning,   R        Signed and Held            Vitals/Pain Today's Vitals   04/10/23 2030 04/10/23 2100 04/10/23 2104 04/10/23 2144  BP: 128/78 (!) 132/90    Pulse: 93 93    Resp:      Temp:    98.3 F (36.8 C)  TempSrc:      SpO2: 91% 96%    Weight:      Height:      PainSc:   Asleep     Isolation Precautions No active isolations  Medications Medications  vancomycin (VANCOREADY) IVPB 1500 mg/300 mL (1,500 mg Intravenous New  Bag/Given 04/10/23 2256)  ceFEPIme (MAXIPIME) 2 g in sodium chloride 0.9 %  100 mL IVPB (2 g Intravenous New Bag/Given 04/10/23 2212)  HYDROmorphone (DILAUDID) injection 1 mg (1 mg Intravenous Given 04/10/23 1821)  alum & mag hydroxide-simeth (MAALOX/MYLANTA) 200-200-20 MG/5ML suspension 30 mL (30 mLs Oral Given 04/10/23 1913)  HYDROmorphone (DILAUDID) injection 1 mg (1 mg Intravenous Given 04/10/23 2009)  cefTRIAXone (ROCEPHIN) 1 g in sodium chloride 0.9 % 100 mL IVPB (0 g Intravenous Stopped 04/10/23 2104)  iohexol (OMNIPAQUE) 300 MG/ML solution 100 mL (100 mLs Intravenous Contrast Given 04/10/23 2127)    Mobility walks with device

## 2023-04-10 NOTE — ED Triage Notes (Signed)
Pt c/o pain around suprapubic catheter site. EMS reports that pt just had his suprapubic catheter replaced 1 week ago, now pain at insertion site for 2 days. Pt also c/o abdominal pressure and urinary retention. Pt also c/o redness on skin on lower abdomen.

## 2023-04-10 NOTE — Telephone Encounter (Signed)
He definitely has a UTI. Call in Cipro 500 mg BID for 10 days as we wait on the culture results

## 2023-04-10 NOTE — Addendum Note (Signed)
Encounter addended by: Shona Simpson, RN on: 04/10/2023 8:51 AM  Actions taken: Imaging Exam ended

## 2023-04-10 NOTE — Telephone Encounter (Signed)
Spoke with [facility] nurse Christy regarding pt urine results. [facility] nurse stated that when she went out to see pt today pt catheter was backed up according to the caregiver,stated that pt complained of pain and had redness, bad odor in his groin area to his navel which appeared like he had cellulitis. Pt urine was very cloudy, had a bad odor and brownish color to it. [facility] nurse stated that she called 911 for pt to go to the ED for further evaluation. Notified Dr Clent Ridges who advised not to send the Antibiotic cipro since pt was going to the ED.

## 2023-04-10 NOTE — H&P (Signed)
Calvin [name] BJY:[id number] DOB: August 10, 1945 DOA: 04/10/2023     PCP: [name], MD   Outpatient Specialists:  CARDS:  Dr. Lance Muss, MD NEurology   Dr. Arbutus Leas    Urology Dr. Arita Miss   Patient arrived to ER on 04/10/23 at 1735 Referred by Attending Therisa Doyne, MD   Patient coming from:    home Lives alone,     Chief Complaint:   Chief Complaint  Patient presents with   Abdominal Pain    HPI: Calvin [name] is a 77 y.o. male with medical history significant of   CVA, neurogenic bladder, A.fib on xarelto    Presented with redness around catheter sight and abd pain  Here with swelling and redness of the abdominal wall around the catheter site Denies any fever or chill l no N/V no diarrhea  Reports abd pain and catheter not functioning again Went to out pt MD and UA showed evidence of UTI was sent to ER Catheter exchanged again and large amount of URine produced Has been on augmenting     significant ETOH intake  about a fith of vodka every few days Does not smoke   No results found for: "SARSCOV2NAA"      Regarding pertinent Chronic problems:    Hyperlipidemia - on statins Lipitor (atorvastatin)  Lipid Panel     Component Value Date/Time   CHOL 173 05/28/2022 0250   TRIG 111 05/28/2022 0250   HDL 59 05/28/2022 0250   CHOLHDL 2.9 05/28/2022 0250   VLDL 22 05/28/2022 0250   LDLCALC 92 05/28/2022 0250   LDLCALC 67 01/05/2020 1047   LDLDIRECT 157.1 02/16/2011 0849     HTN on diltiazem   last echo  Recent Results (from the past 08657 hour(s))  ECHOCARDIOGRAM COMPLETE   Collection Time: 01/08/23  9:16 AM  Result Value   Weight 4,032   Height 72   BP 155/93   S' Lateral 3.10   AR max vel 2.22   AV Area VTI 2.30   AV Mean grad 5.0   AV Peak grad 8.8   Ao pk vel 1.48   AV Area mean vel 2.20   Est EF 60 - 65%   Narrative      ECHOCARDIOGRAM REPORT     IMPRESSIONS    1. Left ventricular ejection fraction, by estimation, is 60 to 65%.  The left ventricle has normal function. The left ventricle has no regional wall motion abnormalities. There is mild concentric left ventricular hypertrophy. Left ventricular diastolic  parameters are indeterminate.  2. Right ventricular systolic function is normal. The right ventricular size is normal.  3. The mitral valve is grossly normal. No evidence of mitral valve regurgitation. No evidence of mitral stenosis.  4. The aortic valve was not well visualized. Aortic valve regurgitation is not visualized. No aortic stenosis is present.  5. There is borderline dilatation of the ascending aorta, measuring 38 mm.  6. The inferior vena cava is normal in size with greater than 50% respiratory variability, suggesting right atrial pressure of 3 mmHg.  Comparison(s): No significant change from prior study. Prior images reviewed side by side. Poor quality images, but no overt regional wall motion abnormalities are seen. Incessant ectopy limits evaluation of mitral inflow/diastolic parameters.         Hx of CVA - *with/out residual deficits on Aspirin 81 mg, 325, Plavix   A. Fib -   atrial fibrillation CHA2DS2 vas score   6  current  on anticoagulation with  Xarelto,     -  Rate control:  Currently controlled with Diltiazem,           While in ER: Clinical Course as of 04/10/23 2101  Wed Apr 10, 2023  1840 CBC with mild leukocytosis.  [CS]  1858 BMP is unremarkable.  [CS]  2001 Catheter exchanged without difficulty. Will begin Abx, Rocephin, to cover both urine and cellulitis. Plan admission for further management. [CS]  2028 Spoke with Dr. Adela Glimpse, Hospitalist, who will evaluate the patient for admission.  [CS]    Clinical Course User Index [CS] Pollyann Savoy, MD       Lab Orders         Culture, blood (routine x 2)         Urine Culture         Basic metabolic panel         CBC with Differential         Urinalysis, w/ Reflex to Culture (Infection Suspected) -Urine, Suprapubic       CTabd/pelvis - ordered    Following Medications were ordered in ER: Medications  HYDROmorphone (DILAUDID) injection 1 mg (1 mg Intravenous Given 04/10/23 1821)  alum & mag hydroxide-simeth (MAALOX/MYLANTA) 200-200-20 MG/5ML suspension 30 mL (30 mLs Oral Given 04/10/23 1913)  HYDROmorphone (DILAUDID) injection 1 mg (1 mg Intravenous Given 04/10/23 2009)  cefTRIAXone (ROCEPHIN) 1 g in sodium chloride 0.9 % 100 mL IVPB (1 g Intravenous New Bag/Given 04/10/23 2010)    _______________________________________________________ ER Provider Called:   Urology They Recommend admit to medicine    If needed pls re consult in AM   ED Triage Vitals  Encounter Vitals Group     BP 04/10/23 1747 (!) 166/97     Systolic BP Percentile --      Diastolic BP Percentile --      Pulse Rate 04/10/23 1747 (!) 105     Resp 04/10/23 1747 20     Temp 04/10/23 1747 97.6 F (36.4 C)     Temp Source 04/10/23 1751 Oral     SpO2 04/10/23 1747 96 %     Weight 04/10/23 1753 154 lb 15.7 oz (70.3 kg)     Height 04/10/23 1753 6' (1.829 m)     Head Circumference --      Peak Flow --      Pain Score 04/10/23 1752 9     Pain Loc --      Pain Education --      Exclude from Growth Chart --   ZOXW(96)@     _________________________________________ Significant initial  Findings: Abnormal Labs Reviewed  BASIC METABOLIC PANEL - Abnormal; Notable for the following components:      Result Value   CO2 21 (*)    Glucose, Bld 145 (*)    All other components within normal limits  CBC WITH DIFFERENTIAL/PLATELET - Abnormal; Notable for the following components:   WBC 10.9 (*)    Neutro Abs 8.2 (*)    Monocytes Absolute 1.2 (*)    All other components within normal limits  URINALYSIS, W/ REFLEX TO CULTURE (INFECTION SUSPECTED) - Abnormal; Notable for the following components:   Color, Urine AMBER (*)    APPearance CLOUDY (*)    Hgb urine dipstick MODERATE (*)    Protein, ur 100 (*)    Leukocytes,Ua LARGE (*)     Bacteria, UA FEW (*)    All other components within normal limits  ECG: Ordered     The recent clinical data is shown below. Vitals:   04/10/23 1930 04/10/23 1945 04/10/23 2000 04/10/23 2030  BP: (!) 160/108 129/83 (!) 168/94 128/78  Pulse: (!) 101 99 98 93  Resp:      Temp:      TempSrc:      SpO2: 94% 94% 95% 91%  Weight:      Height:        WBC     Component Value Date/Time   WBC 10.9 (H) 04/10/2023 1814   LYMPHSABS 1.2 04/10/2023 1814   MONOABS 1.2 (H) 04/10/2023 1814   EOSABS 0.2 04/10/2023 1814   BASOSABS 0.0 04/10/2023 1814     UA     evidence of UTI    Urine analysis:    Component Value Date/Time   COLORURINE AMBER (A) 04/10/2023 1954   APPEARANCEUR CLOUDY (A) 04/10/2023 1954   LABSPEC 1.012 04/10/2023 1954   PHURINE 8.0 04/10/2023 1954   GLUCOSEU NEGATIVE 04/10/2023 1954   HGBUR MODERATE (A) 04/10/2023 1954   HGBUR negative 12/14/2009 0823   BILIRUBINUR NEGATIVE 04/10/2023 1954   BILIRUBINUR neg 04/10/2023 1538   KETONESUR NEGATIVE 04/10/2023 1954   PROTEINUR 100 (A) 04/10/2023 1954   UROBILINOGEN 1.0 04/10/2023 1538   UROBILINOGEN 1.0 03/12/2012 1250   NITRITE NEGATIVE 04/10/2023 1954   LEUKOCYTESUR LARGE (A) 04/10/2023 1954    Results for orders placed or performed during the hospital encounter of 03/04/23  Urine Culture     Status: Abnormal   Collection Time: 03/04/23 10:40 AM   Specimen: Urine, Clean Catch  Result Value Ref Range Status   Specimen Description   Final    URINE, CLEAN CATCH Performed at [facility], [address] [place], [place] [address]    Special Requests   Final    NONE Performed at [facility], [address], [place], [place] [address]    Culture >=100,000 COLONIES/mL PROTEUS MIRABILIS (A)  Final   Report Status 03/06/2023 FINAL  Final   Organism ID, Bacteria PROTEUS MIRABILIS (A)  Final      Susceptibility   Proteus mirabilis - MIC*    AMPICILLIN <=2 SENSITIVE Sensitive      CEFAZOLIN 8 SENSITIVE Sensitive     CEFEPIME <=0.12 SENSITIVE Sensitive     CEFTRIAXONE <=0.25 SENSITIVE Sensitive     CIPROFLOXACIN 1 RESISTANT Resistant     GENTAMICIN <=1 SENSITIVE Sensitive     IMIPENEM 1 SENSITIVE Sensitive     NITROFURANTOIN 128 RESISTANT Resistant     TRIMETH/SULFA <=20 SENSITIVE Sensitive     AMPICILLIN/SULBACTAM <=2 SENSITIVE Sensitive     PIP/TAZO <=4 SENSITIVE Sensitive     * >=100,000 COLONIES/mL PROTEUS MIRABILIS    ABX started Antibiotics Given (last 72 hours)     Date/Time Action Medication Dose Rate   04/10/23 2010 New Bag/Given   cefTRIAXone (ROCEPHIN) 1 g in sodium chloride 0.9 % 100 mL IVPB 1 g 200 mL/hr       Susceptibility data from last 90 days. Collected Specimen Info Organism AMPICILLIN AMPICILLIN/SULBACTAM CEFAZOLIN CEFEPIME CEFTRIAXONE Ciprofloxacin Gentamicin Susc lslt Imipenem Nitrofurantoin Susc lslt Piperacillin + Tazobactam Trimethoprim/Sulfa  03/04/23 Urine, Clean Catch Proteus mirabilis  S  S  S  S  S  R  S  S  R  S  S    __________________________________________________________ Recent Labs  Lab 04/10/23 1814  NA 135  K 3.8  CO2 21*  GLUCOSE 145*  BUN 19  CREATININE  0.99  CALCIUM 8.9    Cr   stable,  Lab Results  Component Value Date   CREATININE 0.99 04/10/2023   CREATININE 0.85 03/04/2023   CREATININE 0.93 01/17/2023     Lab Results  Component Value Date   CALCIUM 8.9 04/10/2023   PHOS 4.0 10/29/2022        Plt: Lab Results  Component Value Date   PLT 272 04/10/2023       Recent Labs  Lab 04/10/23 1814  WBC 10.9*  NEUTROABS 8.2*  HGB 14.1  HCT 41.1  MCV 91.1  PLT 272    HG/HCT  stable     Component Value Date/Time   HGB 14.1 04/10/2023 1814   HCT 41.1 04/10/2023 1814   MCV 91.1 04/10/2023 1814    ______________________________________ Hospitalist was called for admission for   Complication, suprapubic catheter obstruction, initial encounter   Acute cystitis without  hematuria Cellulitis of abdominal wall    The following Work up has been ordered so far:  Orders Placed This Encounter  Procedures   SUPRAPUBIC TUBE PLACEMENT   Culture, blood (routine x 2)   Urine Culture   Basic metabolic panel   CBC with Differential   Urinalysis, w/ Reflex to Culture (Infection Suspected) -Urine, Suprapubic   Cardiac Monitoring - Continuous Indefinite   Consult to hospitalist   Admit to Inpatient (patient's expected length of stay will be greater than 2 midnights or inpatient only procedure)     OTHER Significant initial  Findings:  labs showing:     DM  labs:  HbA1C: Recent Labs    05/28/22 0250  HGBA1C 5.5    CBG (last 3)  No results for input(s): "GLUCAP" in the last 72 hours.     Cultures:    Component Value Date/Time   SDES  03/04/2023 1040    URINE, CLEAN CATCH Performed at [facility], [address], [place], [place] [address]    SPECREQUEST  03/04/2023 1040    NONE Performed at [facility] [address], [place], [place] [address]    CULT >=100,000 COLONIES/mL PROTEUS MIRABILIS (A) 03/04/2023 1040   REPTSTATUS 03/06/2023 FINAL 03/04/2023 1040     Radiological Exams on Admission: No results found. _______________________________________________________________________________________________________ Latest  Blood pressure 128/78, pulse 93, temperature 98 F (36.7 C), temperature source Oral, resp. rate 18, height 6' (1.829 m), weight 70.3 kg, SpO2 91%.   Vitals  labs and radiology finding personally reviewed  Review of Systems:    Pertinent positives include:  abd redness and swelling Constitutional:  No weight loss, night sweats, Fevers, chills, fatigue, weight loss  HEENT:  No headaches, Difficulty swallowing,Tooth/dental problems,Sore throat,  No sneezing, itching, ear ache, nasal congestion, post nasal drip,  Cardio-vascular:  No chest pain, Orthopnea, PND, anasarca, dizziness,  palpitations.no Bilateral lower extremity swelling  GI:  No heartburn, indigestion, abdominal pain, nausea, vomiting, diarrhea, change in bowel habits, loss of appetite, melena, blood in stool, hematemesis Resp:  no shortness of breath at rest. No dyspnea on exertion, No excess mucus, no productive cough, No non-productive cough, No coughing up of blood.No change in color of mucus.No wheezing. Skin:  no rash or lesions. No jaundice GU:  no dysuria, change in color of urine, no urgency or frequency. No straining to urinate.  No flank pain.  Musculoskeletal:  No joint pain or no joint swelling. No decreased range of motion. No back pain.  Psych:  No change in mood or affect. No  depression or anxiety. No memory loss.  Neuro: no localizing neurological complaints, no tingling, no weakness, no double vision, no gait abnormality, no slurred speech, no confusion  All systems reviewed and apart from HOPI all are negative _______________________________________________________________________________________________ Past Medical History:   Past Medical History:  Diagnosis Date   Anxiety    Benign prostatic hypertrophy    COVID-19 virus infection 02/24/2020   Depression    ED (erectile dysfunction)    Headache(784.0)    History of kidney stones    Hypertension    Hypogonadism male    Low back pain 06/09/2020   Nephrolithiasis    hx of had lithotripsy in 1-10.   NEPHROLITHIASIS, HX OF 10/25/2008   Qualifier: Diagnosis of   By: Clent Ridges MD, Tera Mater      Neuromuscular disorder [facility])    neuropaty legs   Pneumonia    Pyelonephritis 02/25/2022   Stroke [facility])    fall 2023 some memory issues like time of day    Past Surgical History:  Procedure Laterality Date   colonoscopy  10/05/2020   per Dr. Russella Dar, adenomatous polyps, repeat in 3 yrs   CYSTOSCOPY W/ RETROGRADES Bilateral 12/25/2022   Procedure: CYSTOSCOPY, TRANSURETHERAL RESECTION OF PROSTATE;  Surgeon: Noel Christmas, MD;  Location:  [facility];  Service: Urology;  Laterality: Bilateral;  90 MINS FRO CASE   FOOT SURGERY     INSERTION OF SUPRAPUBIC CATHETER N/A 12/25/2022   Procedure: INSERTION OF SUPRAPUBIC CATHETER;  Surgeon: Noel Christmas, MD;  Location: [facility];  Service: Urology;  Laterality: N/A;   IR 3D INDEPENDENT WKST  01/16/2023   IR ANGIOGRAM PELVIS SELECTIVE OR SUPRASELECTIVE  01/16/2023   IR ANGIOGRAM SELECTIVE EACH ADDITIONAL VESSEL  01/16/2023   IR ANGIOGRAM SELECTIVE EACH ADDITIONAL VESSEL  01/16/2023   IR ANGIOGRAM SELECTIVE EACH ADDITIONAL VESSEL  01/16/2023   IR ANGIOGRAM SELECTIVE EACH ADDITIONAL VESSEL  01/16/2023   IR ANGIOGRAM SELECTIVE EACH ADDITIONAL VESSEL  01/16/2023   IR ANGIOGRAM SELECTIVE EACH ADDITIONAL VESSEL  01/16/2023   IR EMBO TUMOR ORGAN ISCHEMIA INFARCT INC GUIDE ROADMAPPING  01/16/2023   IR EMBO TUMOR ORGAN ISCHEMIA INFARCT INC GUIDE ROADMAPPING  01/16/2023   IR EMBO TUMOR ORGAN ISCHEMIA INFARCT INC GUIDE ROADMAPPING  01/16/2023   IR US GUIDE VASC ACCESS LEFT  01/16/2023   IR US GUIDE VASC ACCESS LEFT  01/16/2023   LAPAROSCOPIC APPENDECTOMY  03/12/2012   Procedure: APPENDECTOMY LAPAROSCOPIC;  Surgeon: Shelly Rubenstein, MD;  Location: [facility];  Service: General;  Laterality: N/A;   STAPLE HEMORRHOIDECTOMY  04/14/2010   per Dr. Michaell Cowing    TONSILLECTOMY      Social History:  Ambulatory  cane,     reports that he has never smoked. He has never used smokeless tobacco. He reports that he does not currently use alcohol after a past usage of about 14.0 standard drinks of alcohol per week. He reports that he does not use drugs.    Family History:   Family History  Problem Relation Age of Onset   Heart Problems Mother        Caused by a Virus when she was a teen   Stroke Father    Prostate cancer Father    Hypertension Other    Stroke Other    Heart disease Other        rheumatic    Coronary artery disease Other    Healthy Child    Colon cancer Neg Hx    Colon polyps Neg  Hx    Esophageal  cancer Neg Hx    Rectal cancer Neg Hx    Stomach cancer Neg Hx    ______________________________________________________________________________________________ Allergies: No Known Allergies   Prior to Admission medications   Medication Sig Start Date End Date Taking? Authorizing Provider  albuterol (VENTOLIN HFA) 108 (90 Base) MCG/ACT inhaler Inhale 2 puffs into the lungs every 4 (four) hours as needed for wheezing or shortness of breath. 03/15/23   [name], MD  amoxicillin-clavulanate (AUGMENTIN) 875-125 MG tablet Take 1 tablet by mouth every 12 (twelve) hours. 03/04/23   Lonell Grandchild, MD  atorvastatin (LIPITOR) 20 MG tablet Take 1 tablet (20 mg total) by mouth daily. 03/15/23   [name], MD  cyanocobalamin (VITAMIN B12) 1000 MCG/ML injection INJECT 1 ML (1,000 MCG TOTAL) INTO THE MUSCLE ONCE A WEEK. 03/13/22   [name], MD  cyclobenzaprine (FLEXERIL) 10 MG tablet Take 1 tablet (10 mg total) by mouth 3 (three) times daily as needed for muscle spasms. 12/14/22   [name], MD  diltiazem (CARDIZEM CD) 180 MG 24 hr capsule Take 1 capsule (180 mg total) by mouth daily. 01/07/23   Lewie Chamber, MD  docusate sodium (COLACE) 100 MG capsule Take 100 mg by mouth every 12 (twelve) hours as needed.    [provider]  finasteride (PROSCAR) 5 MG tablet TAKE 1 TABLET (5 MG TOTAL) BY MOUTH DAILY. 03/04/23   [name], MD  fluticasone (FLONASE) 50 MCG/ACT nasal spray Place 1 spray into both nostrils daily. 10/10/22   Omar Person, MD  folic acid (FOLVITE) 1 MG tablet Take 1 tablet (1 mg total) by mouth daily. 12/14/22   [name], MD  furosemide (LASIX) 20 MG tablet Take 1 tablet (20 mg total) by mouth daily. 12/14/22   [name], MD  HYDROcodone bit-homatropine (HYCODAN) 5-1.5 MG/5ML syrup Take 5 mLs by mouth every 4 (four) hours as needed. 03/15/23   [name], MD  hydrocortisone cream-nystatin cream-zinc oxide Apply topically. Not sure the name of  the cream- Uses twice daily for wound care    [provider]  KLOR-CON M10 10 MEQ tablet Take 1 tablet (10 mEq total) by mouth 2 (two) times daily. 12/14/22   [name], MD  loperamide (IMODIUM) 2 MG capsule Take 1 capsule (2 mg total) by mouth every 8 (eight) hours as needed for diarrhea or loose stools. 12/14/22   [name], MD  melatonin 3 MG TABS tablet Take 3 mg by mouth at bedtime.    [provider]  Multiple Vitamin (MULTIVITAMIN WITH MINERALS) TABS tablet Take 1 tablet by mouth daily. 06/01/22   Marguerita Merles Latif, DO  ondansetron (ZOFRAN) 4 MG tablet Take 1 tablet (4 mg total) by mouth every 8 (eight) hours as needed for nausea or vomiting. 12/14/22   [name], MD  potassium chloride (KLOR-CON) 10 MEQ tablet TAKE 1 TABLET BY MOUTH 2 TIMES DAILY. 03/11/23   [name], MD  rivaroxaban (XARELTO) 20 MG TABS tablet Take 1 tablet (20 mg total) by mouth daily. 03/15/23   [name], MD  sertraline (ZOLOFT) 100 MG tablet TAKE 1 TABLET BY MOUTH EVERY DAY 03/04/23   [name], MD  SYRINGE-NEEDLE, DISP, 3 ML (BD ECLIPSE SYRINGE/NEEDLE) 25G X 5/8" 3 ML MISC Use as directed once a week 12/14/22   [name], MD  tamsulosin (FLOMAX) 0.4 MG CAPS capsule TAKE 1 CAPSULE  BY MOUTH EVERY DAY 12/14/22   [name], MD  thiamine (VITAMIN B-1) 100 MG tablet Take 1 tablet (100 mg total) by mouth daily. 12/14/22   [name], MD    ___________________________________________________________________________________________________ Physical Exam:    04/10/2023    8:30 PM 04/10/2023    8:00 PM 04/10/2023    7:45 PM  Vitals with BMI  Systolic 128 168 295  Diastolic 78 94 83  Pulse 93 98 99    1. General:  in No  Acute distress    Chronically ill -appearing 2. Psychological: Alert and  Oriented 3. Head/ENT:    Dry Mucous Membranes                          Head Non traumatic, neck supple                           Poor Dentition 4. SKIN:  decreased Skin  turgor,  Skin clean Dry and intact no rash  5. Heart: Regular rate and rhythm no  Murmur, no Rub or gallop 6. Lungs:  , no wheezes or crackles   7. Abdomen: Soft,  non-tender, Non distended bowel sounds present 8. Lower extremities: no clubbing, cyanosis, no  edema 9. Neurologically Grossly intact, moving all 4 extremities equally  10. MSK: Normal range of motion    Chart has been reviewed  _______________________  Assessment/Plan 77 y.o. male with medical history significant of   CVA, neurogenic bladder, A.fib on Xarelto Admitted for Complication, suprapubic catheter obstruction, initial encounter   Acute cystitis without hematuria Cellulitis of abdominal wall      Present on Admission:  Cellulitis  Essential hypertension  Bladder outflow obstruction  UTI (urinary tract infection)  Alcohol abuse  Catheter-associated urinary tract infection (HCC)  Mixed hyperlipidemia    Cellulitis Of the abdominal wall  Essential hypertension Continue diltiazem 180 mg po q day  Bladder outflow obstruction With indwelling suprapubic catheter  UTI (urinary tract infection) Recurrent UTI vs colonization will cover with Abx rocephin overnight  Alcohol abuse CIWA protocol     Catheter-associated urinary tract infection (HCC) Continue Rocephin and await result of Urine Culture  Mixed hyperlipidemia Continue Lipitor 20 mg po q day   Other plan as per orders.  DVT prophylaxis: xarelto      Code Status:    Code Status: Prior FULL CODE   as per patient   I had personally discussed CODE STATUS with patient   ACP   none   Family Communication:   Family not at  Bedside       Diet  heart healthy   Disposition Plan:   To home once workup is complete and patient is stable   Following barriers for discharge:                      Pain controlled with PO medications                              white count improving able to transition to PO antibiotics     Consult Orders  (From  admission, onward)           Start     Ordered   04/11/23 2112  Nutritional services consult  Once       Provider:  (Not yet assigned)  Question:  Reason for Consult?  Answer:  assess nutrtional status   04/10/23 2117   04/10/23 2002  Consult to hospitalist  Once       Provider:  (Not yet assigned)  Question Answer Comment  Place call to: [facility]   Reason for Consult Admit      04/10/23 2001                                Consults called: AM consult to Urology if needed     Admission status:  ED Disposition     ED Disposition  Admit   Condition  --   Comment  Hospital Area: [facility] [[id number]]  Level of Care: Progressive [102]  Admit to Progressive based on following criteria: MULTISYSTEM THREATS such as stable sepsis, metabolic/electrolyte imbalance with or without encephalopathy that is responding to early treatment.  May admit patient to [facility] or [facility] if equivalent level of care is available:: No  Covid Evaluation: Asymptomatic - no recent exposure (last 10 days) testing not required  Diagnosis: Cellulitis [[id number]]  Admitting Physician: Therisa Doyne [[id number]]  Attending Physician: Therisa Doyne [[id number]]  Certification:: I certify this patient will need inpatient services for at least 2 midnights  Expected Medical Readiness: 04/12/2023           inpatient     I Expect 2 midnight stay secondary to severity of patient's current illness need for inpatient interventions justified by the following:    Severe lab/radiological/exam abnormalities including:    Cellulitis  and extensive comorbidities including:  history of stroke with residual deficits   Chronic anticoagulation  That are currently affecting medical management.   I expect  patient to be hospitalized for 2 midnights requiring inpatient medical care.  Patient is at high risk for adverse outcome (such as loss of life or disability) if not  treated.  Indication for inpatient stay as follows:     Need for IV antibiotics, IV fluids,    Level of care   progressive      tele indefinitely please discontinue once patient no longer qualifies COVID-19 Labs     Jashay Roddy 04/10/2023, 9:37 PM     [facility]     after 2 AM please page floor coverage PA If 7AM-7PM, please contact the day team taking care of the patient using [email or web address]

## 2023-04-10 NOTE — Assessment & Plan Note (Signed)
Continue Lipitor 20 mg po q day

## 2023-04-10 NOTE — Telephone Encounter (Signed)
Pt urine received and run, urine results send to Dr Clent Ridges for advise

## 2023-04-10 NOTE — Assessment & Plan Note (Addendum)
Recurrent UTI vs colonization will cover with Abx cefepime overnight

## 2023-04-10 NOTE — ED Provider Notes (Signed)
[facility] AT [facility]  Provider Note  CSN: [id number] Arrival date & time: 04/10/23 1735  History Chief Complaint  Patient presents with   Abdominal Pain    Calvin [name] is a 77 y.o. male with history of stroke, neurogenic bladder with chronic suprapubic catheter and frequent UTI, here with increased pain, cloudy urine and decreased drainage from his catheter, last changed in the ED on 10/13, prior change on 9/16 had Urine Cx with proteus (resistant to Cipro and Macrobid) more remote cultures were Klebsiella resistant to ampicillin. He denies any fever. UA done as an outpatient today showed 2+ LE, neg Nit, alkalotic and pos Protein. He has also noted redness on his lower abdomen/groin area with tenderness.    Home Medications Prior to Admission medications   Medication Sig Start Date End Date Taking? Authorizing Provider  albuterol (VENTOLIN HFA) 108 (90 Base) MCG/ACT inhaler Inhale 2 puffs into the lungs every 4 (four) hours as needed for wheezing or shortness of breath. 03/15/23   [name], MD  amoxicillin-clavulanate (AUGMENTIN) 875-125 MG tablet Take 1 tablet by mouth every 12 (twelve) hours. 03/04/23   Lonell Grandchild, MD  atorvastatin (LIPITOR) 20 MG tablet Take 1 tablet (20 mg total) by mouth daily. 03/15/23   [name], MD  cyanocobalamin (VITAMIN B12) 1000 MCG/ML injection INJECT 1 ML (1,000 MCG TOTAL) INTO THE MUSCLE ONCE A WEEK. 03/13/22   [name], MD  cyclobenzaprine (FLEXERIL) 10 MG tablet Take 1 tablet (10 mg total) by mouth 3 (three) times daily as needed for muscle spasms. 12/14/22   [name], MD  diltiazem (CARDIZEM CD) 180 MG 24 hr capsule Take 1 capsule (180 mg total) by mouth daily. 01/07/23   Lewie Chamber, MD  docusate sodium (COLACE) 100 MG capsule Take 100 mg by mouth every 12 (twelve) hours as needed.    [provider]  finasteride (PROSCAR) 5 MG tablet TAKE 1 TABLET (5 MG TOTAL) BY MOUTH DAILY.  03/04/23   [name], MD  fluticasone (FLONASE) 50 MCG/ACT nasal spray Place 1 spray into both nostrils daily. 10/10/22   Omar Person, MD  folic acid (FOLVITE) 1 MG tablet Take 1 tablet (1 mg total) by mouth daily. 12/14/22   [name], MD  furosemide (LASIX) 20 MG tablet Take 1 tablet (20 mg total) by mouth daily. 12/14/22   [name], MD  HYDROcodone bit-homatropine (HYCODAN) 5-1.5 MG/5ML syrup Take 5 mLs by mouth every 4 (four) hours as needed. 03/15/23   [name], MD  hydrocortisone cream-nystatin cream-zinc oxide Apply topically. Not sure the name of the cream- Uses twice daily for wound care    [provider]  KLOR-CON M10 10 MEQ tablet Take 1 tablet (10 mEq total) by mouth 2 (two) times daily. 12/14/22   [name], MD  loperamide (IMODIUM) 2 MG capsule Take 1 capsule (2 mg total) by mouth every 8 (eight) hours as needed for diarrhea or loose stools. 12/14/22   [name], MD  melatonin 3 MG TABS tablet Take 3 mg by mouth at bedtime.    [provider]  Multiple Vitamin (MULTIVITAMIN WITH MINERALS) TABS tablet Take 1 tablet by mouth daily. 06/01/22   Marguerita Merles Latif, DO  ondansetron (ZOFRAN) 4 MG tablet Take 1 tablet (4 mg total) by mouth every 8 (eight) hours as needed for nausea or vomiting. 12/14/22   [name], MD  potassium chloride (KLOR-CON) 10 MEQ tablet TAKE 1 TABLET BY MOUTH 2 TIMES DAILY. 03/11/23   [name], MD  rivaroxaban (XARELTO) 20 MG TABS tablet Take 1 tablet (20 mg total) by mouth daily. 03/15/23   [name], MD  sertraline (ZOLOFT) 100 MG tablet TAKE 1 TABLET BY MOUTH EVERY DAY 03/04/23   [name], MD  SYRINGE-NEEDLE, DISP, 3 ML (BD ECLIPSE SYRINGE/NEEDLE) 25G X 5/8" 3 ML MISC Use as directed once a week 12/14/22   [name], MD  tamsulosin (FLOMAX) 0.4 MG CAPS capsule TAKE 1 CAPSULE BY MOUTH EVERY DAY 12/14/22   [name], MD  thiamine (VITAMIN B-1) 100 MG tablet Take 1 tablet (100 mg total) by  mouth daily. 12/14/22   [name], MD     Allergies    Patient has no known allergies.   Review of Systems   Review of Systems Please see HPI for pertinent positives and negatives  Physical Exam BP (!) 168/94   Pulse 98   Temp 98 F (36.7 C) (Oral)   Resp 18   Ht 6' (1.829 m)   Wt 70.3 kg   SpO2 95%   BMI 21.02 kg/m   Physical Exam Vitals and nursing note reviewed.  Constitutional:      Appearance: Normal appearance.  HENT:     Head: Normocephalic and atraumatic.     Nose: Nose normal.     Mouth/Throat:     Mouth: Mucous membranes are moist.  Eyes:     Extraocular Movements: Extraocular movements intact.     Conjunctiva/sclera: Conjunctivae normal.  Cardiovascular:     Rate and Rhythm: Normal rate.  Pulmonary:     Effort: Pulmonary effort is normal.     Breath sounds: Normal breath sounds.  Abdominal:     General: Abdomen is flat.     Palpations: Abdomen is soft.     Tenderness: There is abdominal tenderness in the suprapubic area. There is no guarding. Negative signs include Murphy's sign and McBurney's sign.     Comments: Suprapubic catheter in place, moderate surrounding erythema, induration and warmth; significant sediment in catheter tubing.   Musculoskeletal:        General: No swelling. Normal range of motion.     Cervical back: Neck supple.  Skin:    General: Skin is warm and dry.  Neurological:     General: No focal deficit present.     Mental Status: He is alert.  Psychiatric:        Mood and Affect: Mood normal.     ED Results / Procedures / Treatments   EKG None  Procedures SUPRAPUBIC TUBE PLACEMENT  Date/Time: 04/10/2023 7:59 PM  Performed by: Pollyann Savoy, MD Authorized by: Pollyann Savoy, MD   Consent:    Consent obtained:  Verbal   Consent given by:  Patient Sedation:    Sedation type:  None Anesthesia:    Anesthesia method:  None Procedure details:    Complexity:  Simple   Catheter type:  Foley   Catheter  size:  18 Fr   Ultrasound guidance: no     Number of attempts:  1   Urine characteristics:  Mildly cloudy Post-procedure details:    Procedure completion:  Tolerated well, no immediate complications   Medications Ordered in the ED Medications  cefTRIAXone (ROCEPHIN) 1 g in sodium chloride 0.9 % 100 mL IVPB (1 g Intravenous New Bag/Given 04/10/23 2010)  HYDROmorphone (DILAUDID) injection 1  mg (1 mg Intravenous Given 04/10/23 1821)  alum & mag hydroxide-simeth (MAALOX/MYLANTA) 200-200-20 MG/5ML suspension 30 mL (30 mLs Oral Given 04/10/23 1913)  HYDROmorphone (DILAUDID) injection 1 mg (1 mg Intravenous Given 04/10/23 2009)    Initial Impression and Plan  Patient with chronic suprapubic catheter and frequent obstructions attributed to UTI here for same but also has cellulitis in lower abdomen surrounding stoma. Will check labs, change catheter. Pain medications for comfort.   ED Course   Clinical Course as of 04/10/23 2029  Wed Apr 10, 2023  1840 CBC with mild leukocytosis.  [CS]  1858 BMP is unremarkable.  [CS]  2001 Catheter exchanged without difficulty. Will begin Abx, Rocephin, to cover both urine and cellulitis. Plan admission for further management. [CS]  2028 Spoke with Dr. Adela Glimpse, Hospitalist, who will evaluate the patient for admission.  [CS]    Clinical Course User Index [CS] Pollyann Savoy, MD     MDM Rules/Calculators/A&P Medical Decision Making Problems Addressed: Acute cystitis without hematuria: acute illness or injury Cellulitis of abdominal wall: acute illness or injury Complication, suprapubic catheter obstruction, initial encounter [facility]): acute illness or injury  Amount and/or Complexity of Data Reviewed Labs: ordered. Decision-making details documented in ED Course.  Risk OTC drugs. Prescription drug management. Parenteral controlled substances. Decision regarding hospitalization.     Final Clinical Impression(s) / ED Diagnoses Final  diagnoses:  Complication, suprapubic catheter obstruction, initial encounter (HCC)  Acute cystitis without hematuria  Cellulitis of abdominal wall    Rx / DC Orders ED Discharge Orders     None        Pollyann Savoy, MD 04/10/23 2029

## 2023-04-10 NOTE — Telephone Encounter (Signed)
Please call Nurse Maureen Chatters at [phone] once urine order is placed.   Please advise.

## 2023-04-10 NOTE — Telephone Encounter (Signed)
Neysa Bonito is the patient's RN checking patient today from Home Health.  She is very concerned about the patient--she states the patient's abdomen is beet red and has an odor.  Patient complained that it hurt when she lifted it up.  Patient has a catheter and was not like this just 5 days ago.  RN was concerned so she did a Sepsis test on him while she was there.  She brought a urine sample by the lab while she was here.

## 2023-04-10 NOTE — Progress Notes (Signed)
Pharmacy Antibiotic Note  Calvin [name] is a 77 y.o. male admitted on 04/10/2023 with cellulitis.  Pharmacy has been consulted for vanc/cefepime dosing.  Plan: Vanc 1500mg  IV q24 - goal AUC 400-550 Cefepime 2g IV q8  Height: 6' (182.9 cm) Weight: 70.3 kg (154 lb 15.7 oz) IBW/kg (Calculated) : 77.6  Temp (24hrs), Avg:97.8 F (36.6 C), Min:97.6 F (36.4 C), Max:98 F (36.7 C)  Recent Labs  Lab 04/10/23 1814  WBC 10.9*  CREATININE 0.99    Estimated Creatinine Clearance: 64.1 mL/min (by C-G formula based on SCr of 0.99 mg/dL).    No Known Allergies  Thank you for allowing pharmacy to be a part of this patient's care.  Berkley Harvey 04/10/2023 9:31 PM

## 2023-04-10 NOTE — Telephone Encounter (Addendum)
[facility] called back to say Pt is relocating in the next couple of weeks to [place].  If equipment is not received within that timeframe, it needs to be cancelled so that Pt doesn't have any difficulty ordering it when he arrives in [place].   Delorise Shiner is asking for a call back to discuss.  [phone]

## 2023-04-10 NOTE — Assessment & Plan Note (Addendum)
Of the abdominal wall -admit per  cellulitis protocol will      Add vancomycin given purulent discharge,      continue current antibiotic choice      Ct abd ordered   Will obtain MRSA screening,       obtain blood cultures  if febrile or septic     further antibiotic adjustment pending above results

## 2023-04-10 NOTE — Assessment & Plan Note (Signed)
Continue Rocephin and await result of Urine Culture

## 2023-04-10 NOTE — Assessment & Plan Note (Signed)
With indwelling suprapubic catheter

## 2023-04-10 NOTE — Assessment & Plan Note (Signed)
Continue diltiazem 180 mg po q day

## 2023-04-10 NOTE — Assessment & Plan Note (Signed)
On xarelto, continue Lipitor 20 mg

## 2023-04-10 NOTE — Subjective & Objective (Signed)
Here with swelling and redness of the abdominal wall around the catheter site Denies any fever or chill l no N/V no diarrhea  Reports abd pain and catheter not functioning again Went to out pt MD and UA showed evidence of UTI was sent to ER Catheter exchanged again and large amount of URine produced Has been on augmenting

## 2023-04-11 DIAGNOSIS — L03311 Cellulitis of abdominal wall: Secondary | ICD-10-CM | POA: Diagnosis not present

## 2023-04-11 LAB — COMPREHENSIVE METABOLIC PANEL
ALT: 25 U/L (ref 0–44)
AST: 14 U/L — ABNORMAL LOW (ref 15–41)
Albumin: 3.3 g/dL — ABNORMAL LOW (ref 3.5–5.0)
Alkaline Phosphatase: 95 U/L (ref 38–126)
Anion gap: 7 (ref 5–15)
BUN: 16 mg/dL (ref 8–23)
CO2: 24 mmol/L (ref 22–32)
Calcium: 8.5 mg/dL — ABNORMAL LOW (ref 8.9–10.3)
Chloride: 102 mmol/L (ref 98–111)
Creatinine, Ser: 0.84 mg/dL (ref 0.61–1.24)
GFR, Estimated: >60 mL/min (ref >60)
Glucose, Bld: 125 mg/dL — ABNORMAL HIGH (ref 70–99)
Potassium: 3.6 mmol/L (ref 3.5–5.1)
Sodium: 133 mmol/L — ABNORMAL LOW (ref 135–145)
Total Bilirubin: 0.9 mg/dL (ref 0.3–1.2)
Total Protein: 6.6 g/dL (ref 6.5–8.1)

## 2023-04-11 LAB — HEPATIC FUNCTION PANEL
ALT: 15 U/L (ref 0–44)
AST: 12 U/L — ABNORMAL LOW (ref 15–41)
Albumin: 3.3 g/dL — ABNORMAL LOW (ref 3.5–5.0)
Alkaline Phosphatase: 92 U/L (ref 38–126)
Bilirubin, Direct: 0.1 mg/dL (ref 0.0–0.2)
Indirect Bilirubin: 0.6 mg/dL (ref 0.3–0.9)
Total Bilirubin: 0.7 mg/dL (ref 0.3–1.2)
Total Protein: 6.5 g/dL (ref 6.5–8.1)

## 2023-04-11 LAB — CBC
HCT: 37.6 % — ABNORMAL LOW (ref 39.0–52.0)
Hemoglobin: 12.7 g/dL — ABNORMAL LOW (ref 13.0–17.0)
MCH: 31.2 pg (ref 26.0–34.0)
MCHC: 33.8 g/dL (ref 30.0–36.0)
MCV: 92.4 fL (ref 80.0–100.0)
Platelets: 231 10*3/uL (ref 150–400)
RBC: 4.07 MIL/uL — ABNORMAL LOW (ref 4.22–5.81)
RDW: 13.3 % (ref 11.5–15.5)
WBC: 9.4 10*3/uL (ref 4.0–10.5)
nRBC: 0 % (ref 0.0–0.2)

## 2023-04-11 LAB — PHOSPHORUS
Phosphorus: 3.3 mg/dL (ref 2.5–4.6)
Phosphorus: 3.7 mg/dL (ref 2.5–4.6)

## 2023-04-11 LAB — CK: Total CK: 25 U/L — ABNORMAL LOW (ref 49–397)

## 2023-04-11 LAB — PREALBUMIN: Prealbumin: 16 mg/dL — ABNORMAL LOW (ref 18–38)

## 2023-04-11 LAB — LACTIC ACID, PLASMA: Lactic Acid, Venous: 0.9 mmol/L (ref 0.5–1.9)

## 2023-04-11 LAB — PROCALCITONIN: Procalcitonin: <0.1 ng/mL

## 2023-04-11 LAB — MRSA NEXT GEN BY PCR, NASAL: MRSA by PCR Next Gen: NOT DETECTED

## 2023-04-11 LAB — ETHANOL: Alcohol, Ethyl (B): <10 mg/dL (ref <10)

## 2023-04-11 LAB — MAGNESIUM
Magnesium: 1.8 mg/dL (ref 1.7–2.4)
Magnesium: 1.8 mg/dL (ref 1.7–2.4)

## 2023-04-11 LAB — TSH: TSH: 2.249 u[IU]/mL (ref 0.350–4.500)

## 2023-04-11 MED ORDER — RIVAROXABAN 20 MG PO TABS
20.0000 mg | ORAL_TABLET | Freq: Every day | ORAL | Status: DC
  Administered 2023-04-11 – 2023-04-18 (×8): 20 mg via ORAL
  Filled 2023-04-11 (×8): qty 1

## 2023-04-11 MED ORDER — DOCUSATE SODIUM 100 MG PO CAPS: 100.0000 mg | ORAL_CAPSULE | Freq: Two times a day (BID) | ORAL | Status: DC | PRN

## 2023-04-11 MED ORDER — LORAZEPAM 2 MG/ML IJ SOLN: 1.0000 mg | INTRAMUSCULAR | Status: AC | PRN

## 2023-04-11 MED ORDER — DILTIAZEM HCL ER COATED BEADS 180 MG PO CP24
180.0000 mg | ORAL_CAPSULE | Freq: Every day | ORAL | Status: DC
  Administered 2023-04-11 – 2023-04-18 (×8): 180 mg via ORAL
  Filled 2023-04-11 (×8): qty 1

## 2023-04-11 MED ORDER — ADULT MULTIVITAMIN W/MINERALS CH
1.0000 | ORAL_TABLET | Freq: Every day | ORAL | Status: DC
Start: 2023-04-11 — End: 2023-04-18
  Administered 2023-04-11 – 2023-04-18 (×8): 1 via ORAL
  Filled 2023-04-11 (×8): qty 1

## 2023-04-11 MED ORDER — ENSURE ENLIVE PO LIQD
237.0000 mL | Freq: Two times a day (BID) | ORAL | Status: DC
  Administered 2023-04-11 – 2023-04-18 (×15): 237 mL via ORAL

## 2023-04-11 MED ORDER — HYDROCODONE-ACETAMINOPHEN 5-325 MG PO TABS
1.0000 | ORAL_TABLET | ORAL | Status: DC | PRN
  Administered 2023-04-13: 1 via ORAL
  Filled 2023-04-11: qty 2

## 2023-04-11 MED ORDER — TAMSULOSIN HCL 0.4 MG PO CAPS
0.4000 mg | ORAL_CAPSULE | Freq: Every day | ORAL | Status: DC
  Administered 2023-04-11 – 2023-04-18 (×8): 0.4 mg via ORAL
  Filled 2023-04-11 (×7): qty 1

## 2023-04-11 MED ORDER — ACETAMINOPHEN 650 MG RE SUPP: 650.0000 mg | Freq: Four times a day (QID) | RECTAL | Status: DC | PRN

## 2023-04-11 MED ORDER — HYDRALAZINE HCL 20 MG/ML IJ SOLN
5.0000 mg | INTRAMUSCULAR | Status: DC | PRN
  Administered 2023-04-11: 5 mg via INTRAVENOUS
  Filled 2023-04-11: qty 1

## 2023-04-11 MED ORDER — LORAZEPAM 1 MG PO TABS
1.0000 mg | ORAL_TABLET | ORAL | Status: AC | PRN
  Administered 2023-04-14: 2 mg via ORAL
  Filled 2023-04-11: qty 2

## 2023-04-11 MED ORDER — ACETAMINOPHEN 325 MG PO TABS
650.0000 mg | ORAL_TABLET | Freq: Four times a day (QID) | ORAL | Status: DC | PRN
  Administered 2023-04-12 – 2023-04-16 (×5): 650 mg via ORAL
  Filled 2023-04-11 (×5): qty 2

## 2023-04-11 MED ORDER — ONDANSETRON HCL 4 MG/2ML IJ SOLN
4.0000 mg | Freq: Four times a day (QID) | INTRAMUSCULAR | Status: DC | PRN
  Administered 2023-04-15 – 2023-04-16 (×2): 4 mg via INTRAVENOUS
  Filled 2023-04-11 (×2): qty 2

## 2023-04-11 MED ORDER — FOLIC ACID 1 MG PO TABS
1.0000 mg | ORAL_TABLET | Freq: Every day | ORAL | Status: DC
  Administered 2023-04-12 – 2023-04-18 (×7): 1 mg via ORAL
  Filled 2023-04-11 (×7): qty 1

## 2023-04-11 MED ORDER — THIAMINE MONONITRATE 100 MG PO TABS
100.0000 mg | ORAL_TABLET | Freq: Every day | ORAL | Status: DC
  Administered 2023-04-11 – 2023-04-14 (×4): 100 mg via ORAL
  Filled 2023-04-11 (×3): qty 1

## 2023-04-11 MED ORDER — SERTRALINE HCL 50 MG PO TABS
100.0000 mg | ORAL_TABLET | Freq: Every day | ORAL | Status: DC
  Administered 2023-04-11 – 2023-04-18 (×8): 100 mg via ORAL
  Filled 2023-04-11 (×8): qty 2

## 2023-04-11 MED ORDER — THIAMINE MONONITRATE 100 MG PO TABS
100.0000 mg | ORAL_TABLET | Freq: Every day | ORAL | Status: DC
  Administered 2023-04-15 – 2023-04-18 (×4): 100 mg via ORAL
  Filled 2023-04-11 (×6): qty 1

## 2023-04-11 MED ORDER — FINASTERIDE 5 MG PO TABS
5.0000 mg | ORAL_TABLET | Freq: Every day | ORAL | Status: DC
  Administered 2023-04-11 – 2023-04-18 (×8): 5 mg via ORAL
  Filled 2023-04-11 (×8): qty 1

## 2023-04-11 MED ORDER — ONDANSETRON HCL 4 MG PO TABS: 4.0000 mg | ORAL_TABLET | Freq: Four times a day (QID) | ORAL | Status: DC | PRN

## 2023-04-11 MED ORDER — MELATONIN 5 MG PO TABS
5.0000 mg | ORAL_TABLET | Freq: Every evening | ORAL | Status: AC | PRN
  Administered 2023-04-11 – 2023-04-17 (×3): 5 mg via ORAL
  Filled 2023-04-11 (×3): qty 1

## 2023-04-11 MED ORDER — ATORVASTATIN CALCIUM 10 MG PO TABS
20.0000 mg | ORAL_TABLET | Freq: Every day | ORAL | Status: DC
  Administered 2023-04-11 – 2023-04-18 (×8): 20 mg via ORAL
  Filled 2023-04-11 (×8): qty 2

## 2023-04-11 MED ORDER — THIAMINE HCL 100 MG/ML IJ SOLN
100.0000 mg | Freq: Every day | INTRAMUSCULAR | Status: DC
  Filled 2023-04-11: qty 2

## 2023-04-11 MED ORDER — SODIUM CHLORIDE 0.9 % IV SOLN: INTRAVENOUS | Status: AC

## 2023-04-11 NOTE — Hospital Course (Addendum)
77 yom w/ hx of CVA, neurogenic bladder on SP catheter replaced a week ago, A.fib on Xarelto brought to the ED after caregiver noticed catheter was backed up, and patient was complaining of pain at the insertion site for 2 days  and had redness with bad odor in his groin area to his navel and suspected to have cellulitis, urine was very cloudy with bad odor. Recent urine culture had Proteus resistant to Cipro and Macrobid and a remote really had Klebsiella resistant to penicillin. In the ED abdomen was tender in the suprapubic area, vitals showed mild tachycardia 105 heart rate 160s, not hypoxic, no fever. Labs with leukocytosis 10.9, and stable renal function, UA grossly abnormal, lactic acid normal ammonia normal Blood culture urine culture collected and patient was started on antibiotics and admitted for further management

## 2023-04-11 NOTE — Plan of Care (Signed)
  Problem: Clinical Measurements: Goal: Ability to avoid or minimize complications of infection will improve Outcome: Progressing   Problem: Skin Integrity: Goal: Skin integrity will improve Outcome: Progressing   Problem: Education: Goal: Knowledge of General Education information will improve Description: Including pain rating scale, medication(s)/side effects and non-pharmacologic comfort measures Outcome: Progressing   Problem: Health Behavior/Discharge Planning: Goal: Ability to manage health-related needs will improve Outcome: Progressing   Problem: Clinical Measurements: Goal: Ability to maintain clinical measurements within normal limits will improve Outcome: Progressing Goal: Will remain free from infection Outcome: Progressing Goal: Diagnostic test results will improve Outcome: Progressing Goal: Respiratory complications will improve Outcome: Progressing Goal: Cardiovascular complication will be avoided Outcome: Progressing   Problem: Activity: Goal: Risk for activity intolerance will decrease Outcome: Progressing   Problem: Nutrition: Goal: Adequate nutrition will be maintained Outcome: Progressing   Problem: Coping: Goal: Level of anxiety will decrease Outcome: Progressing   Problem: Elimination: Goal: Will not experience complications related to bowel motility Outcome: Progressing Goal: Will not experience complications related to urinary retention Outcome: Progressing   Problem: Pain Management: Goal: General experience of comfort will improve Outcome: Progressing   Problem: Safety: Goal: Ability to remain free from injury will improve Outcome: Progressing   Problem: Skin Integrity: Goal: Risk for impaired skin integrity will decrease Outcome: Progressing

## 2023-04-11 NOTE — ED Notes (Signed)
ED TO INPATIENT HANDOFF REPORT  Name/Age/Gender Calvin [name] 77 y.o. male  Code Status    Code Status Orders  (From admission, onward)           Start     Ordered   04/10/23 2115  Full code  Continuous       Question:  By:  Answer:  Consent: discussion documented in EHR   04/10/23 2117           Code Status History     Date Active Date Inactive Code Status Order ID Comments User Context   01/08/2023 0507 01/19/2023 2305 Full Code [id number]  John Giovanni, MD ED   01/05/2023 1504 01/06/2023 2004 Full Code [id number]  Maryln Gottron, MD ED   10/28/2022 2125 10/29/2022 2027 Full Code [id number]  Frankey Shown, DO Inpatient   05/27/2022 1826 06/04/2022 1632 Full Code [id number]  Synetta Fail, MD ED   03/18/2022 0528 03/22/2022 1757 Full Code [id number]  Briscoe Deutscher, MD ED   02/25/2022 1316 03/02/2022 2253 Full Code [id number]  Emeline General, MD ED   01/19/2016 1417 01/21/2016 1925 Full Code [id number]  Ozella Rocks, MD ED   03/12/2012 2134 03/13/2012 1734 Full Code [id number]  [place], Jenetta Loges, RN Inpatient       Home/SNF/Other Home  Chief Complaint Cellulitis [L03.90] Cellulitis of abdominal wall [L03.311]  Level of Care/Admitting Diagnosis ED Disposition     ED Disposition  Admit   Condition  --   Comment  Hospital Area: [facility] [[id number]]  Level of Care: Telemetry [5]  Admit to tele based on following criteria: Complex arrhythmia (Bradycardia/Tachycardia)  May admit patient to [facility] or [facility] if equivalent level of care is available:: No  Covid Evaluation: Asymptomatic - no recent exposure (last 10 days) testing not required  Diagnosis: Cellulitis of abdominal wall [220254]  Admitting Physician: Lanae Boast [[id number]]  Attending Physician: Lanae Boast [[id number]]  Certification:: I certify this patient will need inpatient services for at least 2 midnights          Medical History Past Medical History:   Diagnosis Date   Anxiety    Benign prostatic hypertrophy    COVID-19 virus infection 02/24/2020   Depression    ED (erectile dysfunction)    Headache(784.0)    History of kidney stones    Hypertension    Hypogonadism male    Low back pain 06/09/2020   Nephrolithiasis    hx of had lithotripsy in 1-10.   NEPHROLITHIASIS, HX OF 10/25/2008   Qualifier: Diagnosis of   By: Clent Ridges MD, Tera Mater      Neuromuscular disorder [facility])    neuropaty legs   Pneumonia    Pyelonephritis 02/25/2022   Stroke [facility])    fall 2023 some memory issues like time of day    Allergies No Known Allergies  IV Location/Drains/Wounds Patient Lines/Drains/Airways Status     Active Line/Drains/Airways     Name Placement date Placement time Site Days   Peripheral IV 04/10/23 20 G 1" Right Antecubital 04/10/23  1816  Antecubital  1   Suprapubic Catheter Latex 18 Fr. 03/31/23  0533  Latex  11   Pressure Injury 05/28/22 Coccyx Medial Stage 1 -  Intact skin with non-blanchable redness of a localized area usually over a bony prominence. 05/28/22  2009  -- 318            Labs/Imaging Results for orders placed or performed during the hospital  encounter of 04/10/23 (from the past 48 hour(s))  Basic metabolic panel     Status: Abnormal   Collection Time: 04/10/23  6:14 PM  Result Value Ref Range   Sodium 135 135 - 145 mmol/L   Potassium 3.8 3.5 - 5.1 mmol/L   Chloride 103 98 - 111 mmol/L   CO2 21 (L) 22 - 32 mmol/L   Glucose, Bld 145 (H) 70 - 99 mg/dL    Comment: Glucose reference range applies only to samples taken after fasting for at least 8 hours.   BUN 19 8 - 23 mg/dL   Creatinine, Ser 1.61 0.61 - 1.24 mg/dL   Calcium 8.9 8.9 - 09.6 mg/dL   GFR, Estimated >04 >54 mL/min    Comment: (NOTE) Calculated using the CKD-EPI Creatinine Equation (2021)    Anion gap 11 5 - 15    Comment: Performed at [facility], [address], [place], [place] [address]  CBC with Differential      Status: Abnormal   Collection Time: 04/10/23  6:14 PM  Result Value Ref Range   WBC 10.9 (H) 4.0 - 10.5 K/uL   RBC 4.51 4.22 - 5.81 MIL/uL   Hemoglobin 14.1 13.0 - 17.0 g/dL   HCT 91.4 78.2 - 95.6 %   MCV 91.1 80.0 - 100.0 fL   MCH 31.3 26.0 - 34.0 pg   MCHC 34.3 30.0 - 36.0 g/dL   RDW 21.3 08.6 - 57.8 %   Platelets 272 150 - 400 K/uL   nRBC 0.0 0.0 - 0.2 %   Neutrophils Relative % 75 %   Neutro Abs 8.2 (H) 1.7 - 7.7 K/uL   Lymphocytes Relative 11 %   Lymphs Abs 1.2 0.7 - 4.0 K/uL   Monocytes Relative 11 %   Monocytes Absolute 1.2 (H) 0.1 - 1.0 K/uL   Eosinophils Relative 2 %   Eosinophils Absolute 0.2 0.0 - 0.5 K/uL   Basophils Relative 0 %   Basophils Absolute 0.0 0.0 - 0.1 K/uL   Immature Granulocytes 1 %   Abs Immature Granulocytes 0.07 0.00 - 0.07 K/uL    Comment: Performed at [facility], [address], [place], [place] [address]  Culture, blood (routine x 2)     Status: None (Preliminary result)   Collection Time: 04/10/23  6:14 PM   Specimen: BLOOD  Result Value Ref Range   Specimen Description      BLOOD RIGHT ANTECUBITAL Performed at [facility], [address] [place], [place] [address]    Special Requests      BOTTLES DRAWN AEROBIC AND ANAEROBIC Blood Culture adequate volume Performed at [facility], [address], [place], [place] [address]    Culture      NO GROWTH < 12 HOURS Performed at [facility], [address] [place], [place] [address]    Report Status PENDING   Culture, blood (routine x 2)     Status: None (Preliminary result)   Collection Time: 04/10/23  7:02 PM   Specimen: BLOOD  Result Value Ref Range   Specimen Description      BLOOD BLOOD RIGHT HAND Performed at [facility], [address], [place], [place] [address]    Special Requests      BOTTLES DRAWN AEROBIC AND ANAEROBIC Blood Culture results may not be optimal due to an inadequate volume of  blood received in culture bottles Performed at [facility], [address], [place], [place] [address]    Culture      NO GROWTH < 12 HOURS Performed at [facility], [address], [place], [place] [address]    Report Status PENDING   Urinalysis, w/ Reflex to Culture (Infection Suspected) -Urine, Suprapubic     Status: Abnormal   Collection Time: 04/10/23  7:54 PM  Result Value Ref Range   Specimen Source URINE, SUPRAPUBIC    Color, Urine AMBER (A) YELLOW    Comment: BIOCHEMICALS MAY BE AFFECTED BY COLOR   APPearance CLOUDY (A) CLEAR   Specific Gravity, Urine 1.012 1.005 - 1.030   pH 8.0 5.0 - 8.0   Glucose, UA NEGATIVE NEGATIVE mg/dL   Hgb urine dipstick MODERATE (A) NEGATIVE   Bilirubin Urine NEGATIVE NEGATIVE   Ketones, ur NEGATIVE NEGATIVE mg/dL   Protein, ur 865 (A) NEGATIVE mg/dL   Nitrite NEGATIVE NEGATIVE   Leukocytes,Ua LARGE (A) NEGATIVE   RBC / HPF 21-50 0 - 5 RBC/hpf   WBC, UA >50 0 - 5 WBC/hpf    Comment:        Reflex urine culture not performed if WBC <=10, OR if Squamous epithelial cells >5. If Squamous epithelial cells >5 suggest recollection.    Bacteria, UA FEW (A) NONE SEEN   Squamous Epithelial / HPF 0-5 0 - 5 /HPF   Mucus PRESENT     Comment: Performed at [facility], [address], [place], [place] [address]  Urine Culture     Status: None (Preliminary result)   Collection Time: 04/10/23  7:54 PM   Specimen: Urine, Suprapubic  Result Value Ref Range   Specimen Description      URINE, SUPRAPUBIC Performed at [facility], [address], [place], [place] [address]    Special Requests      NONE Reflexed from [id number] Performed at [facility], [address] [place], [place] [address]    Culture PENDING    Report Status PENDING   Ammonia     Status: None   Collection Time: 04/10/23 10:02 PM  Result Value Ref Range   Ammonia 12 9 - 35 umol/L    Comment: Performed at  [facility], [address], [place], [place] [address]  Blood gas, venous     Status: Abnormal   Collection Time: 04/10/23 10:02 PM  Result Value Ref Range   pH, Ven 7.39 7.25 - 7.43   pCO2, Ven 43 (L) 44 - 60 mmHg   pO2, Ven 51 (H) 32 - 45 mmHg   Bicarbonate 26.0 20.0 - 28.0 mmol/L   Acid-Base Excess 0.8 0.0 - 2.0 mmol/L   O2 Saturation 83.7 %   Patient temperature 37.0     Comment: Performed at [facility], [address] [place], [place] [address]  Lactic acid, plasma     Status: None   Collection Time: 04/10/23 10:02 PM  Result Value Ref Range   Lactic Acid, Venous 1.2 0.5 - 1.9 mmol/L    Comment: Performed at [facility], [address], [place], [place] [address]  MRSA Next Gen by PCR, Nasal     Status: None   Collection Time: 04/10/23 11:53 PM   Specimen: Nasal Mucosa; Nasal Swab  Result Value Ref Range   MRSA by PCR Next Gen NOT DETECTED NOT DETECTED    Comment: (NOTE) The GeneXpert MRSA Assay (FDA approved for NASAL specimens only), is one component of a  comprehensive MRSA colonization surveillance program. It is not intended to diagnose MRSA infection nor to guide or monitor treatment for MRSA infections. Test performance is not FDA approved in patients less than 12 years old. Performed at [facility], [address] [place], [place] [address]   Lactic acid, plasma     Status: None   Collection Time: 04/11/23  4:18 AM  Result Value Ref Range   Lactic Acid, Venous 0.9 0.5 - 1.9 mmol/L    Comment: Performed at [facility], [address] [place], [place] [address]  Magnesium     Status: None   Collection Time: 04/11/23  9:40 AM  Result Value Ref Range   Magnesium 1.8 1.7 - 2.4 mg/dL    Comment: Performed at [facility], [address] [place], [place] [address]  Phosphorus     Status: None   Collection Time: 04/11/23  9:40 AM  Result Value Ref  Range   Phosphorus 3.3 2.5 - 4.6 mg/dL    Comment: Performed at [facility], [address] [place], [place] [address]  Comprehensive metabolic panel     Status: Abnormal   Collection Time: 04/11/23  9:40 AM  Result Value Ref Range   Sodium 133 (L) 135 - 145 mmol/L   Potassium 3.6 3.5 - 5.1 mmol/L   Chloride 102 98 - 111 mmol/L   CO2 24 22 - 32 mmol/L   Glucose, Bld 125 (H) 70 - 99 mg/dL    Comment: Glucose reference range applies only to samples taken after fasting for at least 8 hours.   BUN 16 8 - 23 mg/dL   Creatinine, Ser 3.66 0.61 - 1.24 mg/dL   Calcium 8.5 (L) 8.9 - 10.3 mg/dL   Total Protein 6.6 6.5 - 8.1 g/dL   Albumin 3.3 (L) 3.5 - 5.0 g/dL   AST 14 (L) 15 - 41 U/L   ALT 25 0 - 44 U/L   Alkaline Phosphatase 95 38 - 126 U/L   Total Bilirubin 0.9 0.3 - 1.2 mg/dL   GFR, Estimated >44 >03 mL/min    Comment: (NOTE) Calculated using the CKD-EPI Creatinine Equation (2021)    Anion gap 7 5 - 15    Comment: Performed at [facility], [address], [place], [place] [address]  CBC     Status: Abnormal   Collection Time: 04/11/23  9:40 AM  Result Value Ref Range   WBC 9.4 4.0 - 10.5 K/uL   RBC 4.07 (L) 4.22 - 5.81 MIL/uL   Hemoglobin 12.7 (L) 13.0 - 17.0 g/dL   HCT 95.6 (L) 38.7 - 56.4 %   MCV 92.4 80.0 - 100.0 fL   MCH 31.2 26.0 - 34.0 pg   MCHC 33.8 30.0 - 36.0 g/dL   RDW 33.2 95.1 - 88.4 %   Platelets 231 150 - 400 K/uL   nRBC 0.0 0.0 - 0.2 %    Comment: Performed at [facility], [address] [place], [place] [address]   LONG TERM MONITOR (3-14 DAYS)  Result Date: 04/11/2023 Patch Wear Time:  13 days and 23 hours Patient had a min HR of 56 bpm, max HR of 218 bpm, and avg HR of 93 bpm. Predominant underlying rhythm was Sinus Rhythm. 9 Ventricular Tachycardia runs occurred, all 6 beats or less Less than 1% atrial fibrillation burden, heart rates 110-175 bpm (avg of 144 bpm), longest 17 mins 3 secs avg rate  143  bpm. 7.9% supraventricular ectopy 2.4% ventricular ectopy No patient triggered episodes recorded Will Camnitz, MD   CT ABDOMEN PELVIS W CONTRAST  Result Date: 04/10/2023 CLINICAL DATA:  History of frequent UTIs with decreased drainage from suprapubic catheter, initial encounter EXAM: CT ABDOMEN AND PELVIS WITH CONTRAST TECHNIQUE: Multidetector CT imaging of the abdomen and pelvis was performed using the standard protocol following bolus administration of intravenous contrast. RADIATION DOSE REDUCTION: This exam was performed according to the departmental dose-optimization program which includes automated exposure control, adjustment of the mA and/or kV according to patient size and/or use of iterative reconstruction technique. CONTRAST:  OMNIPAQUE IOHEXOL 300 MG/ML  SOLN COMPARISON:  01/14/2011 FINDINGS: Lower chest: No acute abnormality. Hepatobiliary: Liver shows some hypodensities consistent with simple cysts. These are stable from the prior exam. No follow-up is recommended. The gallbladder is well distended but otherwise within normal limits. Pancreas: Unremarkable. No pancreatic ductal dilatation or surrounding inflammatory changes. Spleen: Normal in size without focal abnormality. Adrenals/Urinary Tract: Adrenal glands again demonstrate a hypodense right adrenal lesion measuring 2.2 cm. This is stable from multiple previous exams consistent with a benign adenoma. No further follow-up is recommended. Kidneys demonstrate a normal enhancement pattern bilaterally. Renal cysts are noted on the left. These are stable and also need no follow-up. The bladder is decompressed by suprapubic catheter. Inflammatory changes are noted surrounding the bladder which may be related to the known UTI. Stomach/Bowel: Scattered diverticular change of the colon is noted without evidence of diverticulitis. The appendix is within normal limits. Small bowel and stomach are unremarkable. Vascular/Lymphatic: Aortic  atherosclerosis. No enlarged abdominal or pelvic lymph nodes. Reproductive: Prostate is enlarged indenting upon the inferior aspect of the bladder. Other: No abdominal wall hernia or abnormality. No abdominopelvic ascites. Musculoskeletal: No acute abnormality in the lumbar spine. IMPRESSION: Mild inflammatory changes surrounding the decompressed bladder. This may be related to the known UTI. Diverticulosis without diverticulitis. Remainder of the exam is stable from the prior study. Electronically Signed   By: Alcide Clever M.D.   On: 04/10/2023 22:58    Pending Labs Unresulted Labs (From admission, onward)     Start     Ordered   04/11/23 0500  Prealbumin  Tomorrow morning,   R        04/10/23 2108   04/10/23 2109  Ethanol  Add-on,   AD        04/10/23 2108   04/10/23 2109  Procalcitonin  Add-on,   AD       References:    Procalcitonin Lower Respiratory Tract Infection AND Sepsis Procalcitonin Algorithm   04/10/23 2108   04/10/23 2109  Vitamin B1  Add-on,   AD        04/10/23 2108   04/10/23 2109  CK  Add-on,   AD        04/10/23 2108   04/10/23 2109  Magnesium  Add-on,   AD        04/10/23 2108   04/10/23 2109  Phosphorus  Add-on,   AD        04/10/23 2108   04/10/23 2109  TSH  Add-on,   AD        04/10/23 2108   04/10/23 2109  Hepatic function panel  Add-on,   AD        04/10/23 2108            Vitals/Pain Today's Vitals   04/11/23 0800 04/11/23 0916 04/11/23 0947 04/11/23 1030  BP: 121/73  121/73 Marland Kitchen)  151/91  Pulse: 87  87 94  Resp: 18   (!) 22  Temp:  98.5 F (36.9 C)    TempSrc:  Oral    SpO2: 95%   97%  Weight:      Height:      PainSc:        Isolation Precautions No active isolations  Medications Medications  atorvastatin (LIPITOR) tablet 20 mg (has no administration in time range)  diltiazem (CARDIZEM CD) 24 hr capsule 180 mg (has no administration in time range)  docusate sodium (COLACE) capsule 100 mg (has no administration in time range)  finasteride  (PROSCAR) tablet 5 mg (has no administration in time range)  folic acid (FOLVITE) tablet 1 mg (has no administration in time range)  rivaroxaban (XARELTO) tablet 20 mg (has no administration in time range)  sertraline (ZOLOFT) tablet 100 mg (has no administration in time range)  tamsulosin (FLOMAX) capsule 0.4 mg (has no administration in time range)  thiamine (VITAMIN B1) tablet 100 mg (has no administration in time range)  LORazepam (ATIVAN) tablet 1-4 mg (has no administration in time range)    Or  LORazepam (ATIVAN) injection 1-4 mg (has no administration in time range)  thiamine (VITAMIN B1) tablet 100 mg (has no administration in time range)    Or  thiamine (VITAMIN B1) injection 100 mg (has no administration in time range)  multivitamin with minerals tablet 1 tablet (has no administration in time range)  acetaminophen (TYLENOL) tablet 650 mg (has no administration in time range)    Or  acetaminophen (TYLENOL) suppository 650 mg (has no administration in time range)  HYDROcodone-acetaminophen (NORCO/VICODIN) 5-325 MG per tablet 1-2 tablet (has no administration in time range)  ondansetron (ZOFRAN) tablet 4 mg (has no administration in time range)    Or  ondansetron (ZOFRAN) injection 4 mg (has no administration in time range)  0.9 %  sodium chloride infusion (has no administration in time range)  vancomycin (VANCOREADY) IVPB 1500 mg/300 mL (0 mg Intravenous Stopped 04/11/23 0408)  ceFEPIme (MAXIPIME) 2 g in sodium chloride 0.9 % 100 mL IVPB (0 g Intravenous Stopped 04/11/23 0709)  hydrALAZINE (APRESOLINE) injection 5 mg (has no administration in time range)  HYDROmorphone (DILAUDID) injection 1 mg (1 mg Intravenous Given 04/10/23 1821)  alum & mag hydroxide-simeth (MAALOX/MYLANTA) 200-200-20 MG/5ML suspension 30 mL (30 mLs Oral Given 04/10/23 1913)  HYDROmorphone (DILAUDID) injection 1 mg (1 mg Intravenous Given 04/10/23 2009)  cefTRIAXone (ROCEPHIN) 1 g in sodium chloride 0.9 % 100  mL IVPB (0 g Intravenous Stopped 04/10/23 2104)  iohexol (OMNIPAQUE) 300 MG/ML solution 100 mL (100 mLs Intravenous Contrast Given 04/10/23 2127)    Mobility walks with device

## 2023-04-11 NOTE — Progress Notes (Signed)
PROGRESS NOTE Calvin [name]  AVW:[id number] DOB: 1945-09-03 DOA: 04/10/2023 PCP: [name], MD  Brief Narrative/Hospital Course: 77 yom w/ hx of CVA, neurogenic bladder on SP catheter replaced a week ago, A.fib on Xarelto brought to the ED after caregiver noticed catheter was backed up, and patient was complaining of pain at the insertion site for 2 days  and had redness with bad odor in his groin area to his navel and suspected to have cellulitis, urine was very cloudy with bad odor. Recent urine culture had Proteus resistant to Cipro and Macrobid and a remote really had Klebsiella resistant to penicillin. In the ED abdomen was tender in the suprapubic area, vitals showed mild tachycardia 105 heart rate 160s, not hypoxic, no fever. Labs with leukocytosis 10.9, and stable renal function, UA grossly abnormal, lactic acid normal ammonia normal Blood culture urine culture collected and patient was started on antibiotics and admitted for further management    Subjective: Seen and examined in the ED. Alert awake oriented at baseline SP catheter in place with area of redness tenderness   Assessment and Plan: Principal Problem:   Cellulitis Active Problems:   Essential hypertension   Alcohol abuse   Bladder outflow obstruction   UTI (urinary tract infection)   History of stroke   Mixed hyperlipidemia   Catheter-associated urinary tract infection (HCC)   Cellulitis of abdominal wall   Acute cystitis without hematuria In the setting of neurogenic bladder with SP catheter in place that was excised a week ago.  Continue antibiotics with cefepime.  Follow-up blood and urine culture and despite antibiotics  Cellulitis of abdominal wall: Continue antibiotics with vancomycin.Local wound care to continue.  History of CVA HLD: Continue statin, Xarelto.  Hypertension: Poorly controlled.  Continue home Cardizem.  Med rec pending. Add prn hydralazine.  A-fib on Xarelto and Cardizem will  continue the same  Neurogenic bladder-bladder outlet obstruction, SP catheter in place: Catheter exchanged a week ago.  Alcohol abuse history: CIWA protocol Ativan.  DVT prophylaxis: SCDs Start: 04/11/23 0815 Code Status:   Code Status: Full Code Family Communication: plan of care discussed with patient at bedside. Patient status is: admitted as observation but remains hospitalized for ongoing UTI and cellulitis treatment Level of care: Telemetry   Dispo: The patient is from: home with 24/7 caregiver             Anticipated disposition: Back to home.  PT OT eval requested  Objective: Vitals last 24 hrs: Vitals:   04/11/23 0800 04/11/23 0916 04/11/23 0947 04/11/23 1030  BP: 121/73  121/73 (!) 151/91  Pulse: 87  87 94  Resp: 18   (!) 22  Temp:  98.5 F (36.9 C)    TempSrc:  Oral    SpO2: 95%   97%  Weight:      Height:       Weight change:   Physical Examination: General exam: alert awake, 77 older than stated age HEENT:Oral mucosa moist, Ear/Nose WNL grossly Respiratory system: bilaterally clear BS, no use of accessory muscle Cardiovascular system: S1 & S2 +, No JVD. Gastrointestinal system: Abdomen soft,NT,ND, BS+ suprapubic catheter in place with cloudy urine/sediment, skin red and tender around the suprapubic catheter, Nervous System:Alert, awake, moving extremities. Extremities: LE edema neg,distal peripheral pulses palpable.  Skin: No rashes,no icterus. MSK: Normal muscle bulk,tone, power  Medications reviewed:  Scheduled Meds:  atorvastatin  20 mg Oral Daily   diltiazem  180 mg Oral Daily   finasteride  5 mg Oral  Daily   folic acid  1 mg Oral Daily   multivitamin with minerals  1 tablet Oral Daily   rivaroxaban  20 mg Oral Daily   sertraline  100 mg Oral Daily   tamsulosin  0.4 mg Oral Daily   thiamine  100 mg Oral Daily   Or   thiamine  100 mg Intravenous Daily   thiamine  100 mg Oral Daily   Continuous Infusions:  sodium chloride     ceFEPime  (MAXIPIME) IV Stopped (04/11/23 0709)   vancomycin Stopped (04/11/23 0408)   Diet Order             Diet Heart Room service appropriate? Yes; Fluid consistency: Thin  Diet effective now                  Intake/Output Summary (Last 24 hours) at 04/11/2023 1046 Last data filed at 04/11/2023 0709 Gross per 24 hour  Intake 700 ml  Output 2625 ml  Net -1925 ml   Net IO Since Admission: -1,925 mL [04/11/23 1046]  Wt Readings from Last 3 Encounters:  04/10/23 70.3 kg  03/31/23 70.3 kg  03/04/23 70.3 kg     Unresulted Labs (From admission, onward)     Start     Ordered   04/11/23 0500  Prealbumin  Tomorrow morning,   R        04/10/23 2108   04/10/23 2109  Ethanol  Add-on,   AD        04/10/23 2108   04/10/23 2109  Procalcitonin  Add-on,   AD       References:    Procalcitonin Lower Respiratory Tract Infection AND Sepsis Procalcitonin Algorithm   04/10/23 2108   04/10/23 2109  Vitamin B1  Add-on,   AD        04/10/23 2108   04/10/23 2109  CK  Add-on,   AD        04/10/23 2108   04/10/23 2109  Magnesium  Add-on,   AD        04/10/23 2108   04/10/23 2109  Phosphorus  Add-on,   AD        04/10/23 2108   04/10/23 2109  TSH  Add-on,   AD        04/10/23 2108   04/10/23 2109  Hepatic function panel  Add-on,   AD        04/10/23 2108          Data Reviewed: I have personally reviewed following labs and imaging studies CBC: Recent Labs  Lab 04/10/23 1814 04/11/23 0940  WBC 10.9* 9.4  NEUTROABS 8.2*  --   HGB 14.1 12.7*  HCT 41.1 37.6*  MCV 91.1 92.4  PLT 272 231   Basic Metabolic Panel: Recent Labs  Lab 04/10/23 1814 04/11/23 0940  NA 135 133*  K 3.8 3.6  CL 103 102  CO2 21* 24  GLUCOSE 145* 125*  BUN 19 16  CREATININE 0.99 0.84  CALCIUM 8.9 8.5*  MG  --  1.8  PHOS  --  3.3  GFR: Estimated Creatinine Clearance: 75.6 mL/min (by C-G formula based on SCr of 0.84 mg/dL). Liver Function Tests: Recent Labs  Lab 04/11/23 0940  AST 14*  ALT 25   ALKPHOS 95  BILITOT 0.9  PROT 6.6  ALBUMIN 3.3*  No results for input(s): "LIPASE", "AMYLASE" in the last 168 hours. Recent Labs  Lab 04/10/23 2202  AMMONIA 12   Recent Labs  Lab  04/10/23 2202 04/11/23 0418  LATICACIDVEN 1.2 0.9   Recent Results (from the past 240 hour(s))  Culture, blood (routine x 2)     Status: None (Preliminary result)   Collection Time: 04/10/23  6:14 PM   Specimen: BLOOD  Result Value Ref Range Status   Specimen Description   Final    BLOOD RIGHT ANTECUBITAL Performed at [facility], [address], [place], [place] [address]    Special Requests   Final    BOTTLES DRAWN AEROBIC AND ANAEROBIC Blood Culture adequate volume Performed at [facility], [address], [place], [place] [address]    Culture   Final    NO GROWTH < 12 HOURS Performed at [facility], [address], [place], [place] [address]    Report Status PENDING  Incomplete  Culture, blood (routine x 2)     Status: None (Preliminary result)   Collection Time: 04/10/23  7:02 PM   Specimen: BLOOD  Result Value Ref Range Status   Specimen Description   Final    BLOOD BLOOD RIGHT HAND Performed at [facility], [address] [place], [place] [address]    Special Requests   Final    BOTTLES DRAWN AEROBIC AND ANAEROBIC Blood Culture results may not be optimal due to an inadequate volume of blood received in culture bottles Performed at [facility], [address] [place], [place] [address]    Culture   Final    NO GROWTH < 12 HOURS Performed at [facility], [address] [place], [place] [address]    Report Status PENDING  Incomplete  Urine Culture     Status: None (Preliminary result)   Collection Time: 04/10/23  7:54 PM   Specimen: Urine, Suprapubic  Result Value Ref Range Status   Specimen Description   Final    URINE, SUPRAPUBIC Performed at [facility], [address], [place], [place] [address]    Special Requests   Final    NONE Reflexed from [id number] Performed at [facility], [address], [place], [place] [address]    Culture PENDING  Incomplete   Report Status PENDING  Incomplete  MRSA Next Gen by PCR, Nasal     Status: None   Collection Time: 04/10/23 11:53 PM   Specimen: Nasal Mucosa; Nasal Swab  Result Value Ref Range Status   MRSA by PCR Next Gen NOT DETECTED NOT DETECTED Final    Comment: (NOTE) The GeneXpert MRSA Assay (FDA approved for NASAL specimens only), is one component of a comprehensive MRSA colonization surveillance program. It is not intended to diagnose MRSA infection nor to guide or monitor treatment for MRSA infections. Test performance is not FDA approved in patients less than 67 years old. Performed at [facility], [address] [place], [place] [address]     Antimicrobials: Anti-infectives (From admission, onward)    Start     Dose/Rate Route Frequency Ordered Stop   04/10/23 2200  vancomycin (VANCOREADY) IVPB 1500 mg/300 mL        1,500 mg 150 mL/hr over 120 Minutes Intravenous Every 24 hours 04/10/23 2132     04/10/23 2200  ceFEPIme (MAXIPIME) 2 g in sodium chloride 0.9 % 100 mL IVPB        2 g 200 mL/hr over 30 Minutes Intravenous Every 8 hours 04/10/23 2132     04/10/23 2015  cefTRIAXone (ROCEPHIN) 1 g  in sodium chloride 0.9 % 100 mL IVPB        1 g 200 mL/hr over 30 Minutes Intravenous  Once 04/10/23 2001 04/10/23 2104      Culture/Microbiology    Component Value Date/Time   SDES  04/10/2023 1954    URINE, SUPRAPUBIC Performed at [facility], [address] [place], [place] [address]    SPECREQUEST  04/10/2023 1954    NONE Reflexed from [id number] Performed at [facility], [address] [place], [place] [address]    CULT PENDING 04/10/2023 1954   REPTSTATUS PENDING 04/10/2023 1954  Radiology Studies: LONG TERM MONITOR (3-14  DAYS)  Result Date: 04/11/2023 Patch Wear Time:  13 days and 23 hours Patient had a min HR of 56 bpm, max HR of 218 bpm, and avg HR of 93 bpm. Predominant underlying rhythm was Sinus Rhythm. 9 Ventricular Tachycardia runs occurred, all 6 beats or less Less than 1% atrial fibrillation burden, heart rates 110-175 bpm (avg of 144 bpm), longest 17 mins 3 secs avg rate 143 bpm. 7.9% supraventricular ectopy 2.4% ventricular ectopy No patient triggered episodes recorded Will Camnitz, MD   CT ABDOMEN PELVIS W CONTRAST  Result Date: 04/10/2023 CLINICAL DATA:  History of frequent UTIs with decreased drainage from suprapubic catheter, initial encounter EXAM: CT ABDOMEN AND PELVIS WITH CONTRAST TECHNIQUE: Multidetector CT imaging of the abdomen and pelvis was performed using the standard protocol following bolus administration of intravenous contrast. RADIATION DOSE REDUCTION: This exam was performed according to the departmental dose-optimization program which includes automated exposure control, adjustment of the mA and/or kV according to patient size and/or use of iterative reconstruction technique. CONTRAST:  OMNIPAQUE IOHEXOL 300 MG/ML  SOLN COMPARISON:  01/14/2011 FINDINGS: Lower chest: No acute abnormality. Hepatobiliary: Liver shows some hypodensities consistent with simple cysts. These are stable from the prior exam. No follow-up is recommended. The gallbladder is well distended but otherwise within normal limits. Pancreas: Unremarkable. No pancreatic ductal dilatation or surrounding inflammatory changes. Spleen: Normal in size without focal abnormality. Adrenals/Urinary Tract: Adrenal glands again demonstrate a hypodense right adrenal lesion measuring 2.2 cm. This is stable from multiple previous exams consistent with a benign adenoma. No further follow-up is recommended. Kidneys demonstrate a normal enhancement pattern bilaterally. Renal cysts are noted on the left. These are stable and also need no  follow-up. The bladder is decompressed by suprapubic catheter. Inflammatory changes are noted surrounding the bladder which may be related to the known UTI. Stomach/Bowel: Scattered diverticular change of the colon is noted without evidence of diverticulitis. The appendix is within normal limits. Small bowel and stomach are unremarkable. Vascular/Lymphatic: Aortic atherosclerosis. No enlarged abdominal or pelvic lymph nodes. Reproductive: Prostate is enlarged indenting upon the inferior aspect of the bladder. Other: No abdominal wall hernia or abnormality. No abdominopelvic ascites. Musculoskeletal: No acute abnormality in the lumbar spine. IMPRESSION: Mild inflammatory changes surrounding the decompressed bladder. This may be related to the known UTI. Diverticulosis without diverticulitis. Remainder of the exam is stable from the prior study. Electronically Signed   By: Alcide Clever M.D.   On: 04/10/2023 22:58     LOS: 1 day   Lanae Boast, MD [facility]  04/11/2023, 10:46 AM

## 2023-04-11 NOTE — Evaluation (Addendum)
Physical Therapy Evaluation Patient Details Name: Calvin [name] [name]: [id number] DOB: Apr 07, 1946 Today's Date: 04/11/2023  History of Present Illness  Calvin [name] is a 77 yo male admitted with cellulitis. PMH: CVA, neurogenic bladder with suprapubic catheter replaced a week ago, afib  Clinical Impression  Pt admitted with above diagnosis. Pt from home with aide who is present 24/7 assisting with self care as needed, pt reports using RW for in home ambulation, reports "episodes" limit him causing weakness and needing increased assistance. Pt currently needing mod A to come to sitting EOB, needing cues for hand placement and sequencing. Pt needing min A for static sitting EOB. Pt unsteady EOB, unable to attempt stand with +1 assisting. Pt needing +2 to return to supine and scoot up in bed. Pt denies pain throughout eval, though facial wincing noted when RN palpate suprapubic area. Pt with 3/4 dyspnea on RA, SpO2 96% and HR noted 101. Pt not fully a&o, but does report he sold his house in [place], [place] and is moving to [place], [place] when discharged. Unsure if aide able to assist pt at this level and not present during eval. Patient will benefit from continued inpatient follow up therapy, <3 hours/day vs home with HHPT and personal aide assisting. Pt currently with functional limitations due to the deficits listed below (see PT Problem List). Pt will benefit from acute skilled PT to increase their independence and safety with mobility to allow discharge.           If plan is discharge home, recommend the following: A lot of help with walking and/or transfers;A lot of help with bathing/dressing/bathroom;Assistance with cooking/housework;Assist for transportation   Can travel by private vehicle   No    Equipment Recommendations None recommended by PT  Recommendations for Other Services       Functional Status Assessment Patient has had a recent decline in their functional status and demonstrates  the ability to make significant improvements in function in a reasonable and predictable amount of time.     Precautions / Restrictions Precautions Precautions: Fall Precaution Comments: suprapubic catheter Restrictions Weight Bearing Restrictions: No      Mobility  Bed Mobility Overal bed mobility: Needs Assistance Bed Mobility: Supine to Sit, Sit to Supine     Supine to sit: Mod assist, HOB elevated, Used rails Sit to supine: Max assist, +2 for physical assistance   General bed mobility comments: mod A to come to sitting EOB with HOB elevated and use of bedrails, max A+2 to return to supine and scoot up in bed    Transfers                   General transfer comment: not attempted with +1 assist, poor sitting balance EOB    Ambulation/Gait                  Stairs            Wheelchair Mobility     Tilt Bed    Modified Rankin (Stroke Patients Only)       Balance Overall balance assessment: Needs assistance Sitting-balance support: Feet supported, Bilateral upper extremity supported Sitting balance-Leahy Scale: Poor Sitting balance - Comments: min A for static sitting EOB                                     Pertinent Vitals/Pain Pain Assessment Pain Assessment: No/denies pain  Home Living Family/patient expects to be discharged to:: Private residence (Pt reports his house in [place], [place] is sold and he is moving to [place], [place] when he leaves hospital to be near his one and only daughter.) Living Arrangements: Alone Available Help at Discharge: Personal care attendant (aide lives with him 24/7)             Home Equipment: Gilmer Mor - single Librarian, academic (2 wheels);Wheelchair - manual Additional Comments: pt reports ordering shower seat, adjustable bed, and toilet riser but hasn't received them    Prior Function Prior Level of Function : Needs assist             Mobility Comments: pt reports using RW in  the home unless has an "episode", doesn't ambulate community distances, reports 1 fall in 6 months when he slipped on wet floor in bathroom ADLs Comments: pt reports aide assists with self care tasks "sometimes" and he is ind with rest breaks "sometimes"; aide completes household chores     Extremity/Trunk Assessment   Upper Extremity Assessment Upper Extremity Assessment: Defer to OT evaluation    Lower Extremity Assessment Lower Extremity Assessment: Generalized weakness    Cervical / Trunk Assessment Cervical / Trunk Assessment: Kyphotic  Communication   Communication Communication: Hearing impairment  Cognition Arousal: Alert Behavior During Therapy: WFL for tasks assessed/performed Overall Cognitive Status: No family/caregiver present to determine baseline cognitive functioning                                 General Comments: pt states name and hospital appropriately, reports month is "November" and year is "04", reports president is "we don't have one" then laughs and states "I honestly can't think of his name right now"        General Comments      Exercises     Assessment/Plan    PT Assessment Patient needs continued PT services  PT Problem List Decreased strength;Decreased activity tolerance;Decreased balance;Decreased mobility;Decreased cognition;Decreased knowledge of use of DME;Pain;Decreased skin integrity       PT Treatment Interventions DME instruction;Gait training;Functional mobility training;Therapeutic activities;Therapeutic exercise;Balance training;Cognitive remediation;Patient/family education    PT Goals (Current goals can be found in the Care Plan section)  Acute Rehab PT Goals Patient Stated Goal: move to [place], [place] PT Goal Formulation: With patient Time For Goal Achievement: 04/25/23 Potential to Achieve Goals: Good    Frequency Min 1X/week     Co-evaluation               AM-PAC PT "6 Clicks" Mobility  Outcome  Measure Help needed turning from your back to your side while in a flat bed without using bedrails?: A Lot Help needed moving from lying on your back to sitting on the side of a flat bed without using bedrails?: A Lot Help needed moving to and from a bed to a chair (including a wheelchair)?: Total Help needed standing up from a chair using your arms (e.g., wheelchair or bedside chair)?: Total Help needed to walk in hospital room?: Total Help needed climbing 3-5 steps with a railing? : Total 6 Click Score: 8    End of Session   Activity Tolerance: Patient tolerated treatment well Patient left: in bed;with call bell/phone within reach;with bed alarm set;with nursing/sitter in room Nurse Communication: Mobility status PT Visit Diagnosis: Other abnormalities of gait and mobility (R26.89);Muscle weakness (generalized) (M62.81);Difficulty in walking, not elsewhere classified (R26.2)  Time: 1538-1610 PT Time Calculation (min) (ACUTE ONLY): 32 min   Charges:   PT Evaluation $PT Eval Moderate Complexity: 1 Mod PT Treatments $Therapeutic Activity: 8-22 mins PT General Charges $$ ACUTE PT VISIT: 1 Visit          Tori Ernestine Rohman PT, DPT 04/11/23, 4:22 PM

## 2023-04-11 NOTE — ED Notes (Signed)
Tried to sit up in bed and get up to ambulate but had a very difficult time, had shortness of breath, had to take various breaks to catch his breath.

## 2023-04-11 NOTE — Telephone Encounter (Signed)
Left detailed message for [facility] nurse, advised to call the office back

## 2023-04-12 DIAGNOSIS — L03311 Cellulitis of abdominal wall: Secondary | ICD-10-CM | POA: Diagnosis not present

## 2023-04-12 LAB — URINE CULTURE
MICRO NUMBER:: 15632952
SPECIMEN QUALITY:: ADEQUATE

## 2023-04-12 MED ORDER — CEFAZOLIN SODIUM-DEXTROSE 2-4 GM/100ML-% IV SOLN
2.0000 g | Freq: Three times a day (TID) | INTRAVENOUS | Status: DC
  Administered 2023-04-12 – 2023-04-13 (×3): 2 g via INTRAVENOUS
  Filled 2023-04-12 (×3): qty 100

## 2023-04-12 NOTE — Consult Note (Signed)
WOC Nurse Consult Note: Reason for Consult: Consult requested for sacrum.  Pt has a partial thickness fissure related to moisture associated skin damage in the lower gluteal cleft/coccyx area; .5X.5X.1cm, red and moist.  Pressure Injury POA: Yes Dressing procedure/placement/frequency: Topical treatment orders provided for bedside nurses to perform as follows: Foam dressing to sacrum and bilat buttocks. Change Q 3 days or PRN soiling. Please re-consult if further assistance is needed.  Thank-you,  Cammie Mcgee MSN, RN, CWOCN, [place], CNS [phone]

## 2023-04-12 NOTE — Progress Notes (Signed)
PROGRESS NOTE Calvin [name]  ZOX:[id number] DOB: [birth year] DOA: 04/10/2023 PCP: [name], MD  Brief Narrative/Hospital Course: 9 yom w/ hx of CVA, neurogenic bladder on SP catheter replaced a week ago, A.fib on Xarelto brought to the ED after caregiver noticed catheter was backed up, and patient was complaining of pain at the insertion site for 2 days  and had redness with bad odor in his groin area to his navel and suspected to have cellulitis, urine was very cloudy with bad odor. Recent urine culture had Proteus resistant to Cipro and Macrobid and a remote really had Klebsiella resistant to penicillin. In the ED abdomen was tender in the suprapubic area, vitals showed mild tachycardia 105 heart rate 160s, not hypoxic, no fever. Labs with leukocytosis 10.9, and stable renal function, UA grossly abnormal, lactic acid normal ammonia normal Blood culture urine culture collected and patient was started on antibiotics and admitted for further management.    Subjective: Patient seen and examined this morning he is resting comfortably redness improving on his abdominal wall  He has no new complaints   Assessment and Plan: Principal Problem:   Cellulitis Active Problems:   Essential hypertension   Alcohol abuse   Bladder outflow obstruction   UTI (urinary tract infection)   History of stroke   Mixed hyperlipidemia   Catheter-associated urinary tract infection (HCC)   Cellulitis of abdominal wall   Acute cystitis without hematuria In the setting of neurogenic bladder with SP catheter in place that was excchanged a week ago.  Gram-negative rods in urine culture sensitivity pending will change to Ancef based on previous culture report discussed with pharmacy.   Cellulitis of abdominal wall: Cellulitis improving, hopefully Ancef will be enough to cover it.    History of CVA HLD: Continue statin, Xarelto.  Hypertension: Fairly stable at times fluctuating.  Continue home  Cardizem.cont prn hydralazine.  A-fib on Xarelto and Cardizem will continue the same  Neurogenic bladder-bladder outlet obstruction, SP catheter in place: Catheter exchanged a week ago.  Alcohol abuse history: CIWA protocol Ativan.  DVT prophylaxis: SCDs Start: 04/11/23 0815 Code Status:   Code Status: Full Code Family Communication: plan of care discussed with patient at bedside. Patient status is: admitted as observation but remains hospitalized for ongoing UTI and cellulitis treatment Level of care: Telemetry   Dispo: The patient is from: home with 24/7 caregiver             Anticipated disposition: Back to home tomorrow once culture results. Cont  PT OT  Objective: Vitals last 24 hrs: Vitals:   04/11/23 1311 04/11/23 2105 04/11/23 2222 04/12/23 0034  BP: (!) 166/91 (!) 166/103 (!) 183/114 (!) 138/94  Pulse: 88 100 99 88  Resp:  20  17  Temp: 98.5 F (36.9 C) 97.9 F (36.6 C)  98.1 F (36.7 C)  TempSrc: Oral Oral  Oral  SpO2: 97% 92%  96%  Weight:      Height:       Weight change:   Physical Examination: General exam: alert awake, oriented HEENT:Oral mucosa moist, Ear/Nose WNL grossly Respiratory system: Bilaterally clear BS,no use of accessory muscle Cardiovascular system: S1 & S2 +, No JVD. Gastrointestinal system: Abdomen soft,NT,ND, BS+ Nervous System: Alert, awake. Extremities: LE edema neg,distal peripheral pulses palpable and warm.  Skin: No rashes,no icterus. MSK: Normal muscle bulk,tone, power  SP catheter in place with area of redness tenderness  Medications reviewed:  Scheduled Meds:  atorvastatin  20 mg Oral q1800  diltiazem  180 mg Oral Daily   feeding supplement  237 mL Oral BID BM   finasteride  5 mg Oral Daily   folic acid  1 mg Oral Daily   multivitamin with minerals  1 tablet Oral Daily   rivaroxaban  20 mg Oral Q supper   sertraline  100 mg Oral Daily   tamsulosin  0.4 mg Oral Daily   thiamine  100 mg Oral Daily   Or   thiamine  100  mg Intravenous Daily   thiamine  100 mg Oral Daily   Continuous Infusions:   ceFAZolin (ANCEF) IV     Diet Order             Diet Heart Room service appropriate? Yes; Fluid consistency: Thin  Diet effective now                  Intake/Output Summary (Last 24 hours) at 04/12/2023 1156 Last data filed at 04/12/2023 1009 Gross per 24 hour  Intake 659.14 ml  Output 2350 ml  Net -1690.86 ml   Net IO Since Admission: -3,615.86 mL [04/12/23 1156]  Wt Readings from Last 3 Encounters:  04/10/23 70.3 kg  03/31/23 70.3 kg  03/04/23 70.3 kg     Unresulted Labs (From admission, onward)     Start     Ordered   04/10/23 2109  Vitamin B1  Add-on,   AD        04/10/23 2108          Data Reviewed: I have personally reviewed following labs and imaging studies CBC: Recent Labs  Lab 04/10/23 1814 04/11/23 0940  WBC 10.9* 9.4  NEUTROABS 8.2*  --   HGB 14.1 12.7*  HCT 41.1 37.6*  MCV 91.1 92.4  PLT 272 231   Basic Metabolic Panel: Recent Labs  Lab 04/10/23 1814 04/11/23 0940 04/11/23 1354  NA 135 133*  --   K 3.8 3.6  --   CL 103 102  --   CO2 21* 24  --   GLUCOSE 145* 125*  --   BUN 19 16  --   CREATININE 0.99 0.84  --   CALCIUM 8.9 8.5*  --   MG  --  1.8 1.8  PHOS  --  3.3 3.7  GFR: Estimated Creatinine Clearance: 75.6 mL/min (by C-G formula based on SCr of 0.84 mg/dL). Liver Function Tests: Recent Labs  Lab 04/11/23 0940 04/11/23 1354  AST 14* 12*  ALT 25 15  ALKPHOS 95 92  BILITOT 0.9 0.7  PROT 6.6 6.5  ALBUMIN 3.3* 3.3*  No results for input(s): "LIPASE", "AMYLASE" in the last 168 hours. Recent Labs  Lab 04/10/23 2202  AMMONIA 12   Recent Labs  Lab 04/10/23 2202 04/11/23 0418 04/11/23 1354  PROCALCITON  --   --  <0.10  LATICACIDVEN 1.2 0.9  --    Recent Results (from the past 240 hour(s))  Culture, blood (routine x 2)     Status: None (Preliminary result)   Collection Time: 04/10/23  6:14 PM   Specimen: BLOOD  Result Value Ref Range  Status   Specimen Description   Final    BLOOD RIGHT ANTECUBITAL Performed at [facility], [address], [place], [place] [address]    Special Requests   Final    BOTTLES DRAWN AEROBIC AND ANAEROBIC Blood Culture adequate volume Performed at [facility], [address] [place], [place] [address]    Culture   Final  NO GROWTH 2 DAYS Performed at [facility], [address] [place], [place] [address]    Report Status PENDING  Incomplete  Culture, blood (routine x 2)     Status: None (Preliminary result)   Collection Time: 04/10/23  7:02 PM   Specimen: BLOOD  Result Value Ref Range Status   Specimen Description   Final    BLOOD BLOOD RIGHT HAND Performed at [facility], [address] [place], [place] [address]    Special Requests   Final    BOTTLES DRAWN AEROBIC AND ANAEROBIC Blood Culture results may not be optimal due to an inadequate volume of blood received in culture bottles Performed at [facility], [address] [place], [place] [address]    Culture   Final    NO GROWTH 2 DAYS Performed at [facility], [address] [place], [place] [address]    Report Status PENDING  Incomplete  Urine Culture     Status: Abnormal (Preliminary result)   Collection Time: 04/10/23  7:54 PM   Specimen: Urine, Suprapubic  Result Value Ref Range Status   Specimen Description   Final    URINE, SUPRAPUBIC Performed at [facility], [address], [place], [place] [address]    Special Requests   Final    NONE Reflexed from [id number] Performed at [facility], [address], [place], [place] [address]    Culture (A)  Final    >=100,000 COLONIES/mL GRAM NEGATIVE RODS CULTURE REINCUBATED FOR BETTER GROWTH Performed at [facility], [address], [place], [place] [address]    Report Status PENDING  Incomplete  MRSA Next Gen by PCR, Nasal     Status: None    Collection Time: 04/10/23 11:53 PM   Specimen: Nasal Mucosa; Nasal Swab  Result Value Ref Range Status   MRSA by PCR Next Gen NOT DETECTED NOT DETECTED Final    Comment: (NOTE) The GeneXpert MRSA Assay (FDA approved for NASAL specimens only), is one component of a comprehensive MRSA colonization surveillance program. It is not intended to diagnose MRSA infection nor to guide or monitor treatment for MRSA infections. Test performance is not FDA approved in patients less than 6 years old. Performed at [facility], [address], [place], [place] [address]     Antimicrobials: Anti-infectives (From admission, onward)    Start     Dose/Rate Route Frequency Ordered Stop   04/12/23 1400  ceFAZolin (ANCEF) IVPB 2g/100 mL premix        2 g 200 mL/hr over 30 Minutes Intravenous Every 8 hours 04/12/23 1146     04/10/23 2200  vancomycin (VANCOREADY) IVPB 1500 mg/300 mL  Status:  Discontinued        1,500 mg 150 mL/hr over 120 Minutes Intravenous Every 24 hours 04/10/23 2132 04/12/23 1146   04/10/23 2200  ceFEPIme (MAXIPIME) 2 g in sodium chloride 0.9 % 100 mL IVPB  Status:  Discontinued        2 g 200 mL/hr over 30 Minutes Intravenous Every 8 hours 04/10/23 2132 04/12/23 1146   04/10/23 2015  cefTRIAXone (ROCEPHIN) 1 g in sodium chloride 0.9 % 100 mL IVPB        1 g 200 mL/hr over 30 Minutes Intravenous  Once 04/10/23 2001 04/10/23 2104      Culture/Microbiology    Component Value Date/Time   SDES  04/10/2023 1954    URINE, SUPRAPUBIC  Performed at [facility], [address] [place], [place] [address]    SPECREQUEST  04/10/2023 1954    NONE Reflexed from [id number] Performed at [facility], [address] [place], [place] [address]    CULT (A) 04/10/2023 1954    >=100,000 COLONIES/mL GRAM NEGATIVE RODS CULTURE REINCUBATED FOR BETTER GROWTH Performed at [facility], [address], [place], [place] [address]    REPTSTATUS  PENDING 04/10/2023 1954  Radiology Studies: CT ABDOMEN PELVIS W CONTRAST  Result Date: 04/10/2023 CLINICAL DATA:  History of frequent UTIs with decreased drainage from suprapubic catheter, initial encounter EXAM: CT ABDOMEN AND PELVIS WITH CONTRAST TECHNIQUE: Multidetector CT imaging of the abdomen and pelvis was performed using the standard protocol following bolus administration of intravenous contrast. RADIATION DOSE REDUCTION: This exam was performed according to the departmental dose-optimization program which includes automated exposure control, adjustment of the mA and/or kV according to patient size and/or use of iterative reconstruction technique. CONTRAST:  OMNIPAQUE IOHEXOL 300 MG/ML  SOLN COMPARISON:  01/14/2011 FINDINGS: Lower chest: No acute abnormality. Hepatobiliary: Liver shows some hypodensities consistent with simple cysts. These are stable from the prior exam. No follow-up is recommended. The gallbladder is well distended but otherwise within normal limits. Pancreas: Unremarkable. No pancreatic ductal dilatation or surrounding inflammatory changes. Spleen: Normal in size without focal abnormality. Adrenals/Urinary Tract: Adrenal glands again demonstrate a hypodense right adrenal lesion measuring 2.2 cm. This is stable from multiple previous exams consistent with a benign adenoma. No further follow-up is recommended. Kidneys demonstrate a normal enhancement pattern bilaterally. Renal cysts are noted on the left. These are stable and also need no follow-up. The bladder is decompressed by suprapubic catheter. Inflammatory changes are noted surrounding the bladder which may be related to the known UTI. Stomach/Bowel: Scattered diverticular change of the colon is noted without evidence of diverticulitis. The appendix is within normal limits. Small bowel and stomach are unremarkable. Vascular/Lymphatic: Aortic atherosclerosis. No enlarged abdominal or pelvic lymph nodes. Reproductive:  Prostate is enlarged indenting upon the inferior aspect of the bladder. Other: No abdominal wall hernia or abnormality. No abdominopelvic ascites. Musculoskeletal: No acute abnormality in the lumbar spine. IMPRESSION: Mild inflammatory changes surrounding the decompressed bladder. This may be related to the known UTI. Diverticulosis without diverticulitis. Remainder of the exam is stable from the prior study. Electronically Signed   By: Alcide Clever M.D.   On: 04/10/2023 22:58     LOS: 2 days   Lanae Boast, MD [facility]  04/12/2023, 11:56 AM

## 2023-04-12 NOTE — TOC Initial Note (Addendum)
Transition of Care [facility]) - Initial/Assessment Note    Patient Details  Name: Calvin [name] [name]: [id number] Date of Birth: [birth year]  Transition of Care [facility]) CM/SW Contact:    Otelia Santee, LCSW Phone Number: 04/12/2023, 12:48 PM  Clinical Narrative:                 [facility] consulted for SA resources for ETOH use. CSW met with pt who shares that he does not have an issue with alcohol use. He shares he may drink a 1/5 of liquor throughout the week however, does not drink this amount in one sitting and does not drink every day. Pt denies withdrawal symptoms and denies hx of withdrawals. Pt denies craving alcohol and denies negative impact in daily life. Pt denies having SA resources placed on AVS.  Pt shares he is from home where he has a 24/7 personal care aide. He utilizes a RW for ambulation at baseline.  CSW discussed recommendation for SNF placement. Pt is unsure if wants to go to SNF at DC and would prefer to see if he is able to progress with PT to return home. TOC will continue to follow.   ADDENDUM: Work up for SNF complete to determine which facilities able to accept if pt chooses to discharge to SNF vs home.   Expected Discharge Plan: Skilled Nursing Facility Barriers to Discharge: Continued Medical Work up   Patient Goals and CMS Choice Patient states their goals for this hospitalization and ongoing recovery are:: "Not really sure"          Expected Discharge Plan and Services In-house Referral: Clinical Social Work Discharge Planning Services: NA Post Acute Care Choice:  (Unsure if wants SNF or to return home) Living arrangements for the past 2 months: Single Family Home                                      Prior Living Arrangements/Services Living arrangements for the past 2 months: Single Family Home Lives with:: Other (Comment) (24/7 Caregiver) Patient language and need for interpreter reviewed:: Yes Do you feel safe going back to the place where  you live?: Yes      Need for Family Participation in Patient Care: No (Comment) Care giver support system in place?: Yes (comment) Current home services: DME (RW) Criminal Activity/Legal Involvement Pertinent to Current Situation/Hospitalization: No - Comment as needed  Activities of Daily Living   ADL Screening (condition at time of admission) Independently performs ADLs?: No Does the patient have a NEW difficulty with bathing/dressing/toileting/self-feeding that is expected to last >3 days?: Yes (Initiates electronic notice to provider for possible OT consult) Does the patient have a NEW difficulty with getting in/out of bed, walking, or climbing stairs that is expected to last >3 days?: Yes (Initiates electronic notice to provider for possible PT consult) Does the patient have a NEW difficulty with communication that is expected to last >3 days?: No Is the patient deaf or have difficulty hearing?: Yes Does the patient have difficulty seeing, even when wearing glasses/contacts?: No Does the patient have difficulty concentrating, remembering, or making decisions?: No  Permission Sought/Granted   Permission granted to share information with : No              Emotional Assessment Appearance:: Appears stated age Attitude/Demeanor/Rapport: Engaged Affect (typically observed): Pleasant Orientation: : Oriented to Self, Oriented to Place, Oriented to  Time,  Oriented to Situation Alcohol / Substance Use: Alcohol Use Psych Involvement: No (comment)  Admission diagnosis:  Cellulitis of abdominal wall [L03.311] Cellulitis [L03.90] Acute cystitis without hematuria [N30.00] Complication, suprapubic catheter obstruction, initial encounter (HCC) [T83.090A] Patient Active Problem List   Diagnosis Date Noted   Cellulitis of abdominal wall 04/11/2023   Cellulitis 04/10/2023   Dyslipidemia 03/15/2023   Cognitive decline 03/15/2023   Hypercoagulable state due to paroxysmal atrial fibrillation  (HCC) 01/28/2023   Urethral bleeding 01/10/2023   Catheter-associated urinary tract infection (HCC) 01/08/2023   SIRS (systemic inflammatory response syndrome) (HCC) 01/08/2023   Hypoxia 01/08/2023   Hyponatremia 01/08/2023   Hypokalemia 01/08/2023   Elevated brain natriuretic peptide (BNP) level 01/08/2023   A-fib (HCC) 01/06/2023   Atrial fibrillation with rapid ventricular response (HCC) 01/05/2023   Tinea cruris 11/27/2022   Hematuria of unknown cause 10/29/2022   Gross hematuria 10/28/2022   Hypertensive urgency 10/28/2022   Mixed hyperlipidemia 10/28/2022   Closed fracture of lateral malleolus of left fibula 06/15/2022   History of stroke 05/29/2022   Pressure injury of skin 05/29/2022   Acute CVA (cerebrovascular accident) (HCC) 05/27/2022   UTI (urinary tract infection) 05/27/2022   Indwelling Foley catheter present 05/27/2022   Bladder outflow obstruction 03/18/2022   B12 deficiency 02/06/2022   Bilateral leg weakness 01/10/2022   Depression 12/25/2021   Alcohol abuse 12/25/2021   Primary osteoarthritis of left knee 04/23/2018   Hypogonadism in male 01/19/2016   Foot swelling 01/19/2016   Insomnia 10/30/2013   INTERNAL HEMORRHOIDS WITH OTHER COMPLICATION 03/29/2010   Enlarged prostate 10/25/2008   Headache 09/15/2007   Essential hypertension 07/07/2007   PCP:  [name], MD Pharmacy:   [facility] [phone] [place], [place] - [address] [facility] [address] [place] [address] Phone: [phone] Fax: [phone]     Social Determinants of Health (SDOH) Social History: SDOH Screenings   Food Insecurity: No Food Insecurity (04/11/2023)  Housing: Low Risk  (04/11/2023)  Transportation Needs: No Transportation Needs (04/11/2023)  Utilities: Not At Risk (04/11/2023)  Alcohol Screen: Medium Risk (04/14/2020)  Depression (PHQ2-9): Low Risk  (06/28/2022)  Financial Resource Strain: Low Risk  (04/14/2020)   Physical Activity: Inactive (03/08/2021)  Social Connections: Socially Isolated (03/08/2021)  Stress: No Stress Concern Present (04/14/2020)  Tobacco Use: Low Risk  (04/10/2023)   SDOH Interventions:     Readmission Risk Interventions    04/12/2023   12:45 PM 01/06/2023   12:32 PM 10/29/2022   12:11 PM  Readmission Risk Prevention Plan  Transportation Screening Complete Complete Complete  PCP or Specialist Appt within 5-7 Days  Complete   PCP or Specialist Appt within 3-5 Days Complete  Complete  Home Care Screening  Complete   Medication Review (RN CM)  Complete   HRI or Home Care Consult Complete  Complete  Social Work Consult for Recovery Care Planning/Counseling Complete  Complete  Palliative Care Screening Complete  Not Applicable  Medication Review Oceanographer) Complete  Complete

## 2023-04-12 NOTE — Progress Notes (Signed)
Initial Nutrition Assessment  INTERVENTION:   -Ensure Plus High Protein po BID, each supplement provides 350 kcal and 20 grams of protein.   -Continue vitamin supplementation  -Ordered for new weight  NUTRITION DIAGNOSIS:   Increased nutrient needs related to chronic illness as evidenced by estimated needs.  GOAL:   Patient will meet greater than or equal to 90% of their needs  MONITOR:   PO intake, Supplement acceptance, Labs, Weight trends, I & O's  REASON FOR ASSESSMENT:   Consult Assessment of nutrition requirement/status  ASSESSMENT:   66 yom w/ hx of CVA, neurogenic bladder on SP catheter replaced a week ago, A.fib on Xarelto brought to the ED after caregiver noticed catheter was backed up, and patient was complaining of pain at the insertion site for 2 days  and had redness with bad odor in his groin area to his navel and suspected to have cellulitis.  Patient sleeping soundly in room, no family present.  At bedside, multiple snacks available like honey buns and cookies.  Per chart review, pt has been consuming alcohol.  Ate 100% of his breakfast this morning. Ensures have been ordered and pt received one yesterday. Pt  on CIWA.   Per weight records, pt has lost 100 lbs since 9/12. Will order for new measured weight to verify weight loss. Pt went form 115.8 kg on 9/12 to 70.3 kg 9/16 which may be an error and no new weights have been measured since.   Medications: Folic acid, Multivitamin with minerals daily, Thiamine  Labs reviewed.  NUTRITION - FOCUSED PHYSICAL EXAM:  Flowsheet Row Most Recent Value  Orbital Region No depletion  Upper Arm Region No depletion  Thoracic and Lumbar Region No depletion  Buccal Region No depletion  Temple Region Mild depletion  Clavicle Bone Region No depletion  Clavicle and Acromion Bone Region No depletion  Scapular Bone Region No depletion  Dorsal Hand No depletion  Patellar Region No depletion  Anterior Thigh Region No  depletion  Posterior Calf Region No depletion  Edema (RD Assessment) None  Hair Reviewed  Eyes Unable to assess  Mouth Unable to assess  Skin Reviewed       Diet Order:   Diet Order             Diet Heart Room service appropriate? Yes; Fluid consistency: Thin  Diet effective now                   EDUCATION NEEDS:   Not appropriate for education at this time  Skin:  Skin Assessment: Skin Integrity Issues: Skin Integrity Issues:: Other (Comment) Other: Per WOC note:partial thickness fissure related to moisture associated skin damage in the lower gluteal cleft/coccyx area  Last BM:  10/23  Height:   Ht Readings from Last 1 Encounters:  04/10/23 6' (1.829 m)    Weight:   Wt Readings from Last 1 Encounters:  04/10/23 70.3 kg    BMI:  Body mass index is 21.02 kg/m.  Estimated Nutritional Needs:   Kcal:  1800-2000  Protein:  80-90g  Fluid:  2L/day   Tilda Franco, MS, RD, LDN Inpatient Clinical Dietitian Contact information available via Amion

## 2023-04-12 NOTE — [place]2 (Signed)
[place] MEDICAID FL2 LEVEL OF CARE FORM     IDENTIFICATION  Patient Name: Calvin [name] Birthdate: Mar 26, 1946 Sex: male Admission Date (Current Location): 04/10/2023  [facility] and [place] Number:  Producer, television/film/video and Address:  [facility],  [address] [place], [place] [address]      Provider Number: [id number]  Attending Physician Name and Address:  Lanae Boast, MD  Relative Name and Phone Number:  Daughter, Donneta Romberg [phone]    Current Level of Care: Hospital Recommended Level of Care: Skilled Nursing Facility Prior Approval Number:    Date Approved/Denied:   PASRR Number: [id number]  Discharge Plan: SNF    Current Diagnoses: Patient Active Problem List   Diagnosis Date Noted   Cellulitis of abdominal wall 04/11/2023   Cellulitis 04/10/2023   Dyslipidemia 03/15/2023   Cognitive decline 03/15/2023   Hypercoagulable state due to paroxysmal atrial fibrillation (HCC) 01/28/2023   Urethral bleeding 01/10/2023   Catheter-associated urinary tract infection (HCC) 01/08/2023   SIRS (systemic inflammatory response syndrome) (HCC) 01/08/2023   Hypoxia 01/08/2023   Hyponatremia 01/08/2023   Hypokalemia 01/08/2023   Elevated brain natriuretic peptide (BNP) level 01/08/2023   A-fib (HCC) 01/06/2023   Atrial fibrillation with rapid ventricular response (HCC) 01/05/2023   Tinea cruris 11/27/2022   Hematuria of unknown cause 10/29/2022   Gross hematuria 10/28/2022   Hypertensive urgency 10/28/2022   Mixed hyperlipidemia 10/28/2022   Closed fracture of lateral malleolus of left fibula 06/15/2022   History of stroke 05/29/2022   Pressure injury of skin 05/29/2022   Acute CVA (cerebrovascular accident) (HCC) 05/27/2022   UTI (urinary tract infection) 05/27/2022   Indwelling Foley catheter present 05/27/2022   Bladder outflow obstruction 03/18/2022   B12 deficiency 02/06/2022   Bilateral leg weakness 01/10/2022   Depression 12/25/2021   Alcohol  abuse 12/25/2021   Primary osteoarthritis of left knee 04/23/2018   Hypogonadism in male 01/19/2016   Foot swelling 01/19/2016   Insomnia 10/30/2013   INTERNAL HEMORRHOIDS WITH OTHER COMPLICATION 03/29/2010   Enlarged prostate 10/25/2008   Headache 09/15/2007   Essential hypertension 07/07/2007    Orientation RESPIRATION BLADDER Height & Weight     Self, Time, Situation, Place  Normal Incontinent Weight: 256 lb 6.3 oz (116.3 kg) Height:  6' (182.9 cm)  BEHAVIORAL SYMPTOMS/MOOD NEUROLOGICAL BOWEL NUTRITION STATUS      Continent Diet (See discharge summary)  AMBULATORY STATUS COMMUNICATION OF NEEDS Skin   Extensive Assist Verbally PU Stage and Appropriate Care (Coccyx Medial Stage 2)   PU Stage 2 Dressing:  (q 3days)                   Personal Care Assistance Level of Assistance  Bathing, Feeding, Dressing Bathing Assistance: Maximum assistance Feeding assistance: Independent Dressing Assistance: Maximum assistance     Functional Limitations Info  Sight, Hearing, Speech Sight Info: Impaired Hearing Info: Impaired Speech Info: Adequate    SPECIAL CARE FACTORS FREQUENCY  PT (By licensed PT), OT (By licensed OT)     PT Frequency: 5x/wk OT Frequency: 5x/wk            Contractures Contractures Info: Not present    Additional Factors Info  Code Status, Allergies Code Status Info: FULL Allergies Info: No Known Allergies           Current Medications (04/12/2023):  This is the current hospital active medication list Current Facility-Administered Medications  Medication Dose Route Frequency Provider Last Rate Last Admin   acetaminophen (TYLENOL) tablet 650  mg  650 mg Oral Q6H PRN Therisa Doyne, MD       Or   acetaminophen (TYLENOL) suppository 650 mg  650 mg Rectal Q6H PRN Doutova, Anastassia, MD       atorvastatin (LIPITOR) tablet 20 mg  20 mg Oral q1800 Doutova, Anastassia, MD   20 mg at 04/11/23 1800   ceFAZolin (ANCEF) IVPB 2g/100 mL premix  2 g  Intravenous Q8H Kc, Ramesh, MD       diltiazem (CARDIZEM CD) 24 hr capsule 180 mg  180 mg Oral Daily Doutova, Anastassia, MD   180 mg at 04/12/23 1150   docusate sodium (COLACE) capsule 100 mg  100 mg Oral Q12H PRN Therisa Doyne, MD       feeding supplement (ENSURE ENLIVE / ENSURE PLUS) liquid 237 mL  237 mL Oral BID BM Kc, Ramesh, MD   237 mL at 04/12/23 1205   finasteride (PROSCAR) tablet 5 mg  5 mg Oral Daily Doutova, Anastassia, MD   5 mg at 04/12/23 1150   folic acid (FOLVITE) tablet 1 mg  1 mg Oral Daily Doutova, Anastassia, MD   1 mg at 04/12/23 1150   hydrALAZINE (APRESOLINE) injection 5 mg  5 mg Intravenous Q4H PRN Lanae Boast, MD   5 mg at 04/11/23 2222   HYDROcodone-acetaminophen (NORCO/VICODIN) 5-325 MG per tablet 1-2 tablet  1-2 tablet Oral Q4H PRN Therisa Doyne, MD       LORazepam (ATIVAN) tablet 1-4 mg  1-4 mg Oral Q1H PRN Therisa Doyne, MD       Or   LORazepam (ATIVAN) injection 1-4 mg  1-4 mg Intravenous Q1H PRN Doutova, Anastassia, MD       melatonin tablet 5 mg  5 mg Oral QHS PRN Anthoney Harada, NP   5 mg at 04/11/23 2216   multivitamin with minerals tablet 1 tablet  1 tablet Oral Daily Therisa Doyne, MD   1 tablet at 04/12/23 1150   ondansetron (ZOFRAN) tablet 4 mg  4 mg Oral Q6H PRN Therisa Doyne, MD       Or   ondansetron (ZOFRAN) injection 4 mg  4 mg Intravenous Q6H PRN Doutova, Anastassia, MD       rivaroxaban (XARELTO) tablet 20 mg  20 mg Oral Q supper Doutova, Anastassia, MD   20 mg at 04/11/23 1847   sertraline (ZOLOFT) tablet 100 mg  100 mg Oral Daily Doutova, Anastassia, MD   100 mg at 04/12/23 1150   tamsulosin (FLOMAX) capsule 0.4 mg  0.4 mg Oral Daily Doutova, Anastassia, MD   0.4 mg at 04/12/23 1150   thiamine (VITAMIN B1) tablet 100 mg  100 mg Oral Daily Doutova, Anastassia, MD   100 mg at 04/12/23 1150   Or   thiamine (VITAMIN B1) injection 100 mg  100 mg Intravenous Daily Doutova, Anastassia, MD       thiamine (VITAMIN B1) tablet  100 mg  100 mg Oral Daily Therisa Doyne, MD         Discharge Medications: Please see discharge summary for a list of discharge medications.  Relevant Imaging Results:  Relevant Lab Results:   Additional Information SSN [id number]  Otelia Santee, LCSW

## 2023-04-12 NOTE — Evaluation (Signed)
Occupational Therapy Evaluation Patient Details Name: Calvin [name] [name]: [id number] DOB: Feb 11, 1946 Today's Date: 04/12/2023   History of Present Illness Calvin [name] is a 77 yo male admitted with cellulitis. PMH: CVA, neurogenic bladder with suprapubic catheter replaced a week ago, afib   Clinical Impression   Patient evaluated by Occupational Therapy with no further acute OT needs identified. All education has been completed and the patient has no further questions. Pt is at or close to his baseline. Has aide at home that provides 24/7 assist with ADL tasks as needed. Has been working with [facility] and recommending d/c home with assist from aide and resume HHOT.  OT is signing off. Thank you for this referral.        If plan is discharge home, recommend the following: A little help with walking and/or transfers;A little help with bathing/dressing/bathroom;Assist for transportation;Assistance with cooking/housework;Help with stairs or ramp for entrance    Functional Status Assessment  Patient has had a recent decline in their functional status and demonstrates the ability to make significant improvements in function in a reasonable and predictable amount of time.  Equipment Recommendations  BSC/3in1;Hospital bed;Tub/shower bench       Precautions / Restrictions Precautions Precautions: Fall Precaution Comments: suprapubic catheter Restrictions Weight Bearing Restrictions: No      Mobility Bed Mobility Overal bed mobility: Needs Assistance Bed Mobility: Supine to Sit     Supine to sit: Min assist          Transfers Overall transfer level: Needs assistance Equipment used: Rolling walker (2 wheels) Transfers: Sit to/from Stand, Bed to chair/wheelchair/BSC Sit to Stand: Min assist     Step pivot transfers: Contact guard assist     General transfer comment: Initially presented with posterior lean with first sit to stand and required VC for standing posture adjustment.       Balance Overall balance assessment: Needs assistance Sitting-balance support: Bilateral upper extremity supported, Feet supported Sitting balance-Leahy Scale: Good   Postural control: Posterior lean Standing balance support: Bilateral upper extremity supported, During functional activity, Reliant on assistive device for balance Standing balance-Leahy Scale: Poor        ADL either performed or assessed with clinical judgement   ADL Overall ADL's : Modified independent;At baseline            Vision Baseline Vision/History: 1 Wears glasses Ability to See in Adequate Light: 0 Adequate Patient Visual Report: No change from baseline Vision Assessment?: No apparent visual deficits     Perception Perception: Not tested       Praxis Praxis: Not tested       Pertinent Vitals/Pain Pain Assessment Pain Assessment: No/denies pain     Extremity/Trunk Assessment Upper Extremity Assessment Upper Extremity Assessment: Overall WFL for tasks assessed;Right hand dominant   Lower Extremity Assessment Lower Extremity Assessment: Defer to PT evaluation   Cervical / Trunk Assessment Cervical / Trunk Assessment: Kyphotic   Communication Communication Communication: Hearing impairment Cueing Techniques: Verbal cues   Cognition Arousal: Alert Behavior During Therapy: Flat affect Overall Cognitive Status: Within Functional Limits for tasks assessed                   Home Living Family/patient expects to be discharged to:: Private residence (selling his home in [place] and is moving to [place] where his daughter is located) Living Arrangements: Alone Available Help at Discharge: Personal care attendant (aide lives with him 24/7) Type of Home: House Home Access: Stairs to enter  Home Layout: Two level Alternate Level Stairs-Number of Steps: 2 story home with a main level and basement level   Bathroom Shower/Tub: Tub/shower unit   Bathroom Toilet: Handicapped  height     Home Equipment: Cane - single Librarian, academic (2 wheels);Wheelchair - manual   Additional Comments: pt reports ordering shower seat, adjustable bed, and toilet riser but hasn't received them. Has been working with Home health OT and PT      Prior Functioning/Environment Prior Level of Function : Needs assist             Mobility Comments: pt reports using RW in the home unless has an "episode", doesn't ambulate community distances, reports 1 fall in 6 months when he slipped on wet floor in bathroom ADLs Comments: pt reports aide assists with self care tasks "sometimes" and he is ind with rest breaks "sometimes"; aide completes household chores        OT Problem List: Impaired balance (sitting and/or standing);Decreased activity tolerance         OT Goals(Current goals can be found in the care plan section) Acute Rehab OT Goals Patient Stated Goal: to get in the recliner OT Goal Formulation: All assessment and education complete, DC therapy  OT Frequency:  1X visit    Co-evaluation              AM-PAC OT "6 Clicks" Daily Activity     Outcome Measure Help from another person eating meals?: None Help from another person taking care of personal grooming?: None Help from another person toileting, which includes using toliet, bedpan, or urinal?: A Lot Help from another person bathing (including washing, rinsing, drying)?: A Little Help from another person to put on and taking off regular upper body clothing?: A Little Help from another person to put on and taking off regular lower body clothing?: A Little 6 Click Score: 19   End of Session Equipment Utilized During Treatment: Gait belt;Rolling walker (2 wheels) Nurse Communication: Mobility status;Patient requests pain meds  Activity Tolerance: Patient tolerated treatment well Patient left: in chair;with call bell/phone within reach;with chair alarm set  OT Visit Diagnosis: Unsteadiness on feet  (R26.81);Muscle weakness (generalized) (M62.81)                Time: 1610-9604 OT Time Calculation (min): 48 min Charges:  OT General Charges $OT Visit: 1 Visit OT Evaluation $OT Eval Moderate Complexity: 1 Mod OT Treatments $Therapeutic Activity: 23-37 mins  Limmie Patricia, OTR/L,CBIS  Supplemental OT - [facility] and [facility] Secure Chat Preferred    Yanisa Goodgame, Charisse March 04/12/2023, 5:33 PM

## 2023-04-13 DIAGNOSIS — N390 Urinary tract infection, site not specified: Secondary | ICD-10-CM

## 2023-04-13 MED ORDER — SODIUM CHLORIDE 0.9 % IV SOLN
1.0000 g | INTRAVENOUS | Status: DC
  Administered 2023-04-13 – 2023-04-16 (×4): 1 g via INTRAVENOUS
  Filled 2023-04-13 (×4): qty 10

## 2023-04-13 NOTE — Plan of Care (Signed)
  Problem: Skin Integrity: Goal: Skin integrity will improve Outcome: Progressing   Problem: Health Behavior/Discharge Planning: Goal: Ability to manage health-related needs will improve Outcome: Progressing   Problem: Clinical Measurements: Goal: Ability to maintain clinical measurements within normal limits will improve Outcome: Progressing Goal: Respiratory complications will improve Outcome: Progressing   Problem: Nutrition: Goal: Adequate nutrition will be maintained Outcome: Progressing

## 2023-04-13 NOTE — Progress Notes (Signed)
Mobility Specialist - Progress Note   04/13/23 1400  Mobility  Activity Ambulated with assistance in hallway  Level of Assistance Standby assist, set-up cues, supervision of patient - no hands on  Assistive Device Front wheel walker  Distance Ambulated (ft) 100 ft  Activity Response Tolerated well  Mobility Referral Yes  $Mobility charge 1 Mobility  Mobility Specialist Start Time (ACUTE ONLY) 0106  Mobility Specialist Stop Time (ACUTE ONLY) 0120  Mobility Specialist Time Calculation (min) (ACUTE ONLY) 14 min   Pt received in recliner and agreeable to mobility. Pt fatigued with exertion. No complaints during session. Pt to bed after session with all needs met.    [facility]

## 2023-04-13 NOTE — Progress Notes (Signed)
PROGRESS NOTE DOMIQUE SLIMAN  ZOX:[id number] DOB: [birth year] DOA: 04/10/2023 PCP: [name], MD  Brief Narrative/Hospital Course: 77 yom w/ hx of CVA, neurogenic bladder on SP catheter replaced a week ago, A.fib on Xarelto brought to the ED after caregiver noticed catheter was backed up, and patient was complaining of pain at the insertion site for 2 days  and had redness with bad odor in his groin area to his navel and suspected to have cellulitis, urine was very cloudy with bad odor. Recent urine culture had Proteus resistant to Cipro and Macrobid and a remote really had Klebsiella resistant to penicillin. In the ED abdomen was tender in the suprapubic area, vitals showed mild tachycardia 105 heart rate 160s, not hypoxic, no fever. Labs with leukocytosis 10.9, and stable renal function, UA grossly abnormal, lactic acid normal ammonia normal Blood culture urine culture collected and patient was started on antibiotics and admitted for further management.   The patient's urine culture has grown out 100,000 CFU of proteus mirabilis. Sensitivities reveal that it is resistant to Cefazolin. He has been transitioned to Ceftriaxone to which the organism is sensitive.  Subjective: Patient seen and examined this morning he is resting comfortably redness improving on his abdominal wall  He has no new complaints   Assessment and Plan: Principal Problem:   Cellulitis Active Problems:   Essential hypertension   Alcohol abuse   Bladder outflow obstruction   UTI (urinary tract infection)   History of stroke   Mixed hyperlipidemia   Catheter-associated urinary tract infection (HCC)   Cellulitis of abdominal wall   Acute cystitis without hematuria In the setting of neurogenic bladder with SP catheter in place that was excchanged a week ago.  Gram-negative rods in urine culture sensitivity has been resulted as proteus mirabilis that is sensitive to Rocephin, but resistant to ancef. The patient's  antibiotic regimen has been changed.  Cellulitis of abdominal wall: Cellulitis improving per patient.   History of CVA HLD: Continue statin, Xarelto.  Hypertension: Fairly stable at times fluctuating.  Continue home Cardizem.cont prn hydralazine.  A-fib on Xarelto and Cardizem will continue the same  Neurogenic bladder-bladder outlet obstruction, SP catheter in place: Catheter exchanged a week ago.  Alcohol abuse history: CIWA protocol Ativan.  DVT prophylaxis: SCDs Start: 04/11/23 0815 Code Status:   Code Status: Full Code Family Communication: plan of care discussed with patient at bedside. Patient status is: admitted as observation but remains hospitalized for ongoing UTI and cellulitis treatment Level of care: Telemetry   Dispo: The patient is from: home with 24/7 caregiver             Anticipated disposition: Back to home tomorrow once culture results. Cont  PT OT  Objective: Vitals last 24 hrs: Vitals:   04/12/23 1254 04/12/23 2131 04/13/23 0620 04/13/23 1227  BP: (!) 141/83 (!) 140/88 (!) 143/94 (!) 162/95  Pulse: 82 78 77 81  Resp: 17 18 18 20   Temp: 97.9 F (36.6 C) 98 F (36.7 C) 97.8 F (36.6 C) 98.1 F (36.7 C)  TempSrc:  Oral Oral   SpO2: 94% 97% 95% 95%  Weight:      Height:       Weight change:   Physical Examination: Exam:  Constitutional:  The patient is awake, alert, and oriented x 3. No acute distress. Respiratory:  No increased work of breathing. No wheezes, rales, or rhonchi No tactile fremitus Cardiovascular:  Regular rate and rhythm No murmurs, ectopy, or gallups. No lateral  PMI. No thrills. Abdomen:  Abdomen is soft, non-tender, non-distended No hernias, masses, or organomegaly Normoactive bowel sounds.  Suprapubic area is covered with clean and dry bandage. Patient states that the pain is better. Bandage is not removed. Musculoskeletal:  No cyanosis, clubbing, or edema Skin:  No rashes, lesions, ulcers palpation of skin:  no induration or nodules Neurologic:  CN 2-12 intact Sensation all 4 extremities intact Psychiatric:  Mental status Mood, affect appropriate Orientation to person, place, time  judgment and insight appear intact   Medications reviewed:  Scheduled Meds:  atorvastatin  20 mg Oral q1800   diltiazem  180 mg Oral Daily   feeding supplement  237 mL Oral BID BM   finasteride  5 mg Oral Daily   folic acid  1 mg Oral Daily   multivitamin with minerals  1 tablet Oral Daily   rivaroxaban  20 mg Oral Q supper   sertraline  100 mg Oral Daily   tamsulosin  0.4 mg Oral Daily   thiamine  100 mg Oral Daily   Or   thiamine  100 mg Intravenous Daily   thiamine  100 mg Oral Daily   Continuous Infusions:  cefTRIAXone (ROCEPHIN)  IV 1 g (04/13/23 1406)   Diet Order             Diet Heart Room service appropriate? Yes; Fluid consistency: Thin  Diet effective now                  Intake/Output Summary (Last 24 hours) at 04/13/2023 1716 Last data filed at 04/13/2023 1500 Gross per 24 hour  Intake 612.82 ml  Output 3100 ml  Net -2487.18 ml   Net IO Since Admission: -6,865.04 mL [04/13/23 1716]  Wt Readings from Last 3 Encounters:  04/12/23 116.3 kg  03/31/23 70.3 kg  03/04/23 70.3 kg     Unresulted Labs (From admission, onward)     Start     Ordered   04/10/23 2109  Vitamin B1  Add-on,   AD        04/10/23 2108          Data Reviewed: I have personally reviewed following labs and imaging studies CBC: Recent Labs  Lab 04/10/23 1814 04/11/23 0940  WBC 10.9* 9.4  NEUTROABS 8.2*  --   HGB 14.1 12.7*  HCT 41.1 37.6*  MCV 91.1 92.4  PLT 272 231   Basic Metabolic Panel: Recent Labs  Lab 04/10/23 1814 04/11/23 0940 04/11/23 1354  NA 135 133*  --   K 3.8 3.6  --   CL 103 102  --   CO2 21* 24  --   GLUCOSE 145* 125*  --   BUN 19 16  --   CREATININE 0.99 0.84  --   CALCIUM 8.9 8.5*  --   MG  --  1.8 1.8  PHOS  --  3.3 3.7  GFR: Estimated Creatinine Clearance:  100.1 mL/min (by C-G formula based on SCr of 0.84 mg/dL). Liver Function Tests: Recent Labs  Lab 04/11/23 0940 04/11/23 1354  AST 14* 12*  ALT 25 15  ALKPHOS 95 92  BILITOT 0.9 0.7  PROT 6.6 6.5  ALBUMIN 3.3* 3.3*  No results for input(s): "LIPASE", "AMYLASE" in the last 168 hours. Recent Labs  Lab 04/10/23 2202  AMMONIA 12   Recent Labs  Lab 04/10/23 2202 04/11/23 0418 04/11/23 1354  PROCALCITON  --   --  <0.10  LATICACIDVEN 1.2 0.9  --  Recent Results (from the past 240 hour(s))  Culture, Urine     Status: Abnormal   Collection Time: 04/10/23  4:28 PM   Specimen: Urine  Result Value Ref Range Status   MICRO NUMBER: [id number]  Final   SPECIMEN QUALITY: Adequate  Final   Sample Source URINE  Final   STATUS: FINAL  Final   ISOLATE 1: Proteus mirabilis (A)  Final    Comment: Greater than 100,000 CFU/mL of Proteus mirabilis This organism may show imipenem resistance by mechanisms other than a carbapenemase.      Susceptibility   Proteus mirabilis - URINE CULTURE, REFLEX    AMOX/CLAVULANIC <=2 Sensitive     AMPICILLIN <=2 Sensitive     AMPICILLIN/SULBACTAM <=2 Sensitive     CEFAZOLIN* 8 Resistant      * For uncomplicated UTI caused by E. coli, K. pneumoniae or P. mirabilis: Cefazolin is susceptible if MIC <32 mcg/mL and predicts susceptible to the oral agents cefaclor, cefdinir, cefpodoxime, cefprozil, cefuroxime, cephalexin and loracarbef.     CEFTAZIDIME <=1 Sensitive     CEFEPIME <=1 Sensitive     CEFTRIAXONE <=1 Sensitive     CIPROFLOXACIN 2 Resistant     LEVOFLOXACIN 4 Resistant     GENTAMICIN <=1 Sensitive     IMIPENEM 4 Resistant     NITROFURANTOIN 128 Resistant     PIP/TAZO <=4 Sensitive     TOBRAMYCIN <=1 Sensitive     TRIMETH/SULFA* <=20 Sensitive      * For uncomplicated UTI caused by E. coli, K. pneumoniae or P. mirabilis: Cefazolin is susceptible if MIC <32 mcg/mL and predicts susceptible to the oral agents cefaclor, cefdinir, cefpodoxime,  cefprozil, cefuroxime, cephalexin and loracarbef. Legend: S = Susceptible  I = Intermediate R = Resistant  NS = Not susceptible SDD = Susceptible Dose Dependent * = Not Tested  NR = Not Reported **NN = See Therapy Comments   Culture, blood (routine x 2)     Status: None (Preliminary result)   Collection Time: 04/10/23  6:14 PM   Specimen: BLOOD  Result Value Ref Range Status   Specimen Description   Final    BLOOD RIGHT ANTECUBITAL Performed at [facility], [address], [place], [place] [address]    Special Requests   Final    BOTTLES DRAWN AEROBIC AND ANAEROBIC Blood Culture adequate volume Performed at [facility], [address] [place], [place] [address]    Culture   Final    NO GROWTH 3 DAYS Performed at [facility], [address], [place], [place] [address]    Report Status PENDING  Incomplete  Culture, blood (routine x 2)     Status: None (Preliminary result)   Collection Time: 04/10/23  7:02 PM   Specimen: BLOOD  Result Value Ref Range Status   Specimen Description   Final    BLOOD BLOOD RIGHT HAND Performed at [facility], [address], [place], [place] [address]    Special Requests   Final    BOTTLES DRAWN AEROBIC AND ANAEROBIC Blood Culture results may not be optimal due to an inadequate volume of blood received in culture bottles Performed at [facility], [address], [place], [place] [address]    Culture   Final    NO GROWTH 3 DAYS Performed at [facility], [address] [place], [place] [address]    Report Status PENDING  Incomplete  Urine Culture  Status: Abnormal (Preliminary result)   Collection Time: 04/10/23  7:54 PM   Specimen: Urine, Suprapubic  Result Value Ref Range Status   Specimen Description   Final    URINE, SUPRAPUBIC Performed at [facility], [address], [place], [place] [address]    Special Requests   Final    NONE  Reflexed from [id number] Performed at [facility], [address] [place], [place] [address]    Culture (A)  Final    >=100,000 COLONIES/mL PROTEUS MIRABILIS SUSCEPTIBILITIES TO FOLLOW Performed at [facility], [address] [place], [place] [address]    Report Status PENDING  Incomplete  MRSA Next Gen by PCR, Nasal     Status: None   Collection Time: 04/10/23 11:53 PM   Specimen: Nasal Mucosa; Nasal Swab  Result Value Ref Range Status   MRSA by PCR Next Gen NOT DETECTED NOT DETECTED Final    Comment: (NOTE) The GeneXpert MRSA Assay (FDA approved for NASAL specimens only), is one component of a comprehensive MRSA colonization surveillance program. It is not intended to diagnose MRSA infection nor to guide or monitor treatment for MRSA infections. Test performance is not FDA approved in patients less than 55 years old. Performed at [facility], [address], [place], [place] [address]     Antimicrobials: Anti-infectives (From admission, onward)    Start     Dose/Rate Route Frequency Ordered Stop   04/13/23 1400  cefTRIAXone (ROCEPHIN) 1 g in sodium chloride 0.9 % 100 mL IVPB        1 g 200 mL/hr over 30 Minutes Intravenous Every 24 hours 04/13/23 0958     04/12/23 1400  ceFAZolin (ANCEF) IVPB 2g/100 mL premix  Status:  Discontinued        2 g 200 mL/hr over 30 Minutes Intravenous Every 8 hours 04/12/23 1146 04/13/23 0958   04/10/23 2200  vancomycin (VANCOREADY) IVPB 1500 mg/300 mL  Status:  Discontinued        1,500 mg 150 mL/hr over 120 Minutes Intravenous Every 24 hours 04/10/23 2132 04/12/23 1146   04/10/23 2200  ceFEPIme (MAXIPIME) 2 g in sodium chloride 0.9 % 100 mL IVPB  Status:  Discontinued        2 g 200 mL/hr over 30 Minutes Intravenous Every 8 hours 04/10/23 2132 04/12/23 1146   04/10/23 2015  cefTRIAXone (ROCEPHIN) 1 g in sodium chloride 0.9 % 100 mL IVPB        1 g 200 mL/hr over 30 Minutes Intravenous  Once  04/10/23 2001 04/10/23 2104      Culture/Microbiology    Component Value Date/Time   SDES  04/10/2023 1954    URINE, SUPRAPUBIC Performed at [facility], [address], [place], [place] [address]    SPECREQUEST  04/10/2023 1954    NONE Reflexed from [id number] Performed at [facility], [address], [place], [place] [address]    CULT (A) 04/10/2023 1954    >=100,000 COLONIES/mL PROTEUS MIRABILIS SUSCEPTIBILITIES TO FOLLOW Performed at [facility], [address] [place], [place] [address]    REPTSTATUS PENDING 04/10/2023 1954  Radiology Studies: No results found. I have seen and examined this patient myself. I have spent 34 minutes in her evaluation and care.  LOS: 3 days   Fran Lowes, MD [facility]  04/13/2023, 5:16 PM

## 2023-04-13 NOTE — Plan of Care (Signed)
  Problem: Skin Integrity: Goal: Skin integrity will improve Outcome: Progressing   Problem: Clinical Measurements: Goal: Ability to maintain clinical measurements within normal limits will improve Outcome: Progressing Goal: Will remain free from infection Outcome: Progressing Goal: Diagnostic test results will improve Outcome: Progressing Goal: Respiratory complications will improve Outcome: Progressing Goal: Cardiovascular complication will be avoided Outcome: Progressing   Problem: Skin Integrity: Goal: Risk for impaired skin integrity will decrease Outcome: Progressing

## 2023-04-14 DIAGNOSIS — L03311 Cellulitis of abdominal wall: Secondary | ICD-10-CM | POA: Diagnosis not present

## 2023-04-14 DIAGNOSIS — T83090A Other mechanical complication of cystostomy catheter, initial encounter: Secondary | ICD-10-CM | POA: Diagnosis not present

## 2023-04-14 DIAGNOSIS — N3000 Acute cystitis without hematuria: Secondary | ICD-10-CM | POA: Diagnosis not present

## 2023-04-14 DIAGNOSIS — G9341 Metabolic encephalopathy: Secondary | ICD-10-CM | POA: Diagnosis not present

## 2023-04-14 LAB — CBC
HCT: 39.4 % (ref 39.0–52.0)
Hemoglobin: 13.3 g/dL (ref 13.0–17.0)
MCH: 31.7 pg (ref 26.0–34.0)
MCHC: 33.8 g/dL (ref 30.0–36.0)
MCV: 93.8 fL (ref 80.0–100.0)
Platelets: 282 10*3/uL (ref 150–400)
RBC: 4.2 MIL/uL — ABNORMAL LOW (ref 4.22–5.81)
RDW: 13.2 % (ref 11.5–15.5)
WBC: 7.2 10*3/uL (ref 4.0–10.5)
nRBC: 0 % (ref 0.0–0.2)

## 2023-04-14 LAB — BLOOD GAS, ARTERIAL
Acid-Base Excess: 3.1 mmol/L — ABNORMAL HIGH (ref 0.0–2.0)
Bicarbonate: 27 mmol/L (ref 20.0–28.0)
Drawn by: 30136
O2 Saturation: 97.7 %
Patient temperature: 36.9
pCO2 arterial: 38 mm[Hg] (ref 32–48)
pH, Arterial: 7.46 — ABNORMAL HIGH (ref 7.35–7.45)
pO2, Arterial: 76 mm[Hg] — ABNORMAL LOW (ref 83–108)

## 2023-04-14 LAB — BASIC METABOLIC PANEL
Anion gap: 6 (ref 5–15)
BUN: 13 mg/dL (ref 8–23)
CO2: 24 mmol/L (ref 22–32)
Calcium: 8.8 mg/dL — ABNORMAL LOW (ref 8.9–10.3)
Chloride: 102 mmol/L (ref 98–111)
Creatinine, Ser: 0.72 mg/dL (ref 0.61–1.24)
GFR, Estimated: >60 mL/min (ref >60)
Glucose, Bld: 172 mg/dL — ABNORMAL HIGH (ref 70–99)
Potassium: 3.5 mmol/L (ref 3.5–5.1)
Sodium: 132 mmol/L — ABNORMAL LOW (ref 135–145)

## 2023-04-14 MED ORDER — HYDRALAZINE HCL 20 MG/ML IJ SOLN
10.0000 mg | Freq: Four times a day (QID) | INTRAMUSCULAR | Status: DC | PRN
  Administered 2023-04-14 – 2023-04-16 (×3): 10 mg via INTRAVENOUS
  Filled 2023-04-14 (×3): qty 1

## 2023-04-14 MED ORDER — LABETALOL HCL 5 MG/ML IV SOLN
20.0000 mg | Freq: Once | INTRAVENOUS | Status: DC
  Filled 2023-04-14: qty 4

## 2023-04-14 MED ORDER — NALOXONE HCL 0.4 MG/ML IJ SOLN: 0.4000 mg | Freq: Once | INTRAMUSCULAR | Status: DC

## 2023-04-14 MED ORDER — HYDRALAZINE HCL 20 MG/ML IJ SOLN: 10.0000 mg | INTRAMUSCULAR | Status: DC | PRN

## 2023-04-14 MED ORDER — HYDROCODONE-ACETAMINOPHEN 5-325 MG PO TABS
1.0000 | ORAL_TABLET | Freq: Four times a day (QID) | ORAL | Status: DC | PRN
  Administered 2023-04-15 – 2023-04-16 (×2): 1 via ORAL
  Filled 2023-04-14 (×2): qty 1

## 2023-04-14 MED ORDER — IPRATROPIUM-ALBUTEROL 0.5-2.5 (3) MG/3ML IN SOLN
3.0000 mL | Freq: Four times a day (QID) | RESPIRATORY_TRACT | Status: DC
  Administered 2023-04-14 – 2023-04-15 (×4): 3 mL via RESPIRATORY_TRACT
  Filled 2023-04-14 (×4): qty 3

## 2023-04-14 NOTE — Plan of Care (Signed)
  Problem: Clinical Measurements: Goal: Ability to avoid or minimize complications of infection will improve Outcome: Progressing   Problem: Skin Integrity: Goal: Skin integrity will improve Outcome: Progressing   Problem: Education: Goal: Knowledge of General Education information will improve Description: Including pain rating scale, medication(s)/side effects and non-pharmacologic comfort measures Outcome: Progressing   Problem: Health Behavior/Discharge Planning: Goal: Ability to manage health-related needs will improve Outcome: Progressing   Problem: Clinical Measurements: Goal: Will remain free from infection Outcome: Progressing Goal: Diagnostic test results will improve Outcome: Progressing Goal: Respiratory complications will improve Outcome: Progressing Goal: Cardiovascular complication will be avoided Outcome: Progressing   Problem: Coping: Goal: Level of anxiety will decrease Outcome: Progressing   Problem: Pain Management: Goal: General experience of comfort will improve Outcome: Progressing   Problem: Safety: Goal: Ability to remain free from injury will improve Outcome: Progressing   Problem: Skin Integrity: Goal: Risk for impaired skin integrity will decrease Outcome: Progressing

## 2023-04-14 NOTE — Plan of Care (Signed)
  Problem: Clinical Measurements: Goal: Ability to avoid or minimize complications of infection will improve Outcome: Progressing   Problem: Skin Integrity: Goal: Skin integrity will improve Outcome: Progressing   Problem: Education: Goal: Knowledge of General Education information will improve Description: Including pain rating scale, medication(s)/side effects and non-pharmacologic comfort measures Outcome: Progressing   Problem: Health Behavior/Discharge Planning: Goal: Ability to manage health-related needs will improve Outcome: Progressing   Problem: Clinical Measurements: Goal: Ability to maintain clinical measurements within normal limits will improve Outcome: Progressing Goal: Will remain free from infection Outcome: Progressing Goal: Diagnostic test results will improve Outcome: Progressing Goal: Respiratory complications will improve Outcome: Progressing Goal: Cardiovascular complication will be avoided Outcome: Progressing   Problem: Activity: Goal: Risk for activity intolerance will decrease Outcome: Progressing   Problem: Nutrition: Goal: Adequate nutrition will be maintained Outcome: Progressing   Problem: Elimination: Goal: Will not experience complications related to bowel motility Outcome: Progressing Goal: Will not experience complications related to urinary retention Outcome: Progressing   Problem: Coping: Goal: Level of anxiety will decrease Outcome: Progressing   Problem: Pain Management: Goal: General experience of comfort will improve Outcome: Progressing   Problem: Skin Integrity: Goal: Risk for impaired skin integrity will decrease Outcome: Progressing

## 2023-04-14 NOTE — Progress Notes (Addendum)
[facility]                                                                              Calvin [name], is a 77 y.o. male, DOB - February 25, 1946, ZOX:[id number] Admit date - 04/10/2023    Outpatient Primary MD for the patient is [name], MD  LOS - 4  days  Chief Complaint  Patient presents with   Abdominal Pain       Brief summary   Patient is a 78 year old male with history of CVA, neurogenic bladder on SP catheter replaced a week ago, A.fib on Xarelto brought to the ED after caregiver noticed catheter was backed up, and patient was complaining of pain at the insertion site for 2 days and had redness with bad odor in his groin area to his navel and suspected to have cellulitis, urine was very cloudy with bad odor.  In ED, noted to be tender in suprapubic area, rhytidosis showed tachycardia 103, no fever or hypoxia WBCs 10.9  Assessment & Plan    Principal Problem: Catheter-associated urinary tract infection (HCC), Bladder outflow obstruction, Indwelling Foley catheter present -In the setting of neurogenic bladder with suprapubic catheter in place, exchanged a week ago. -Urine culture 10/23 showed showing Proteus mirabilis sensitive to Rocephin (resistant to cefazolin, ciprofloxacin, sensitive to ampicillin and Augmentin) -Started on IV cefepime on 10/23, transitioned to IV Rocephin per sensitivities on 10/26, will treat for 7 days for complicated UTI (transition to oral ampicillin or Augmentin at discharge)  Active Problems:   Cellulitis of abdominal wall -Improving, continue IV Rocephin, transition to oral Augmentin at discharge    Essential hypertension -BP stable  History of alcohol use, acute metabolic encephalopathy -Somewhat lethargic this morning however had received Ativan 2 mg this morning for CIWA of 11 -Continue thiamine, folic acid -Obtain ABG, Narcan 0.4 mg x 1, patient does not report heavy alcohol use, hold off CIWA. -Decreased Norco     History of stroke, Mixed hyperlipidemia -Continue Xarelto, statin  Atrial fibrillation -Rate controlled, continue Cardizem, -Continue Xarelto  ?COPD/acute bronchitis -Noted to be wheezing today, placed on scheduled DuoNebs  Wound care -Partial-thickness fissure sacrum in the lower gluteal cleft, POA -Wound care following, recommended topical treatment and foam dressing, change every 3 days or as needed swelling   Nutrition Problem: Increased nutrient needs Etiology: chronic illness Signs/Symptoms: estimated needs Interventions: Ensure Enlive (each supplement provides 350kcal and 20 grams of protein)   Obesity Estimated body mass index is 34.77 kg/m as calculated from the following:   Height as of this encounter: 6' (1.829 m).   Weight as of this encounter: 116.3 kg.  Code Status: Full CODE STATUS DVT Prophylaxis:  SCDs Start: 04/11/23 0815 rivaroxaban (XARELTO) tablet 20 mg   Level of Care: Level of care: Telemetry Family Communication: Updated patient, no family member at bedside Disposition Plan:      Remains inpatient appropriate:      Procedures:  None  Consultants:   None  Antimicrobials:   Anti-infectives (From admission, onward)    Start     Dose/Rate Route Frequency Ordered Stop   04/13/23 1400  cefTRIAXone (ROCEPHIN)  1 g in sodium chloride 0.9 % 100 mL IVPB        1 g 200 mL/hr over 30 Minutes Intravenous Every 24 hours 04/13/23 0958     04/12/23 1400  ceFAZolin (ANCEF) IVPB 2g/100 mL premix  Status:  Discontinued        2 g 200 mL/hr over 30 Minutes Intravenous Every 8 hours 04/12/23 1146 04/13/23 0958   04/10/23 2200  vancomycin (VANCOREADY) IVPB 1500 mg/300 mL  Status:  Discontinued        1,500 mg 150 mL/hr over 120 Minutes Intravenous Every 24 hours 04/10/23 2132 04/12/23 1146   04/10/23 2200  ceFEPIme (MAXIPIME) 2 g in sodium chloride 0.9 % 100 mL IVPB  Status:  Discontinued        2 g 200 mL/hr over 30 Minutes Intravenous Every 8 hours  04/10/23 2132 04/12/23 1146   04/10/23 2015  cefTRIAXone (ROCEPHIN) 1 g in sodium chloride 0.9 % 100 mL IVPB        1 g 200 mL/hr over 30 Minutes Intravenous  Once 04/10/23 2001 04/10/23 2104          Medications  atorvastatin  20 mg Oral q1800   diltiazem  180 mg Oral Daily   feeding supplement  237 mL Oral BID BM   finasteride  5 mg Oral Daily   folic acid  1 mg Oral Daily   ipratropium-albuterol  3 mL Nebulization Q6H   multivitamin with minerals  1 tablet Oral Daily   rivaroxaban  20 mg Oral Q supper   sertraline  100 mg Oral Daily   tamsulosin  0.4 mg Oral Daily   thiamine  100 mg Oral Daily   Or   thiamine  100 mg Intravenous Daily   thiamine  100 mg Oral Daily      Subjective:   Calvin [name] was seen and examined today.  Somewhat lethargic but easily arousable and responds appropriately to verbal commands.  Diffuse wheezing noted.  Denies any pain.  No chest pain, abdominal pain, nausea or vomiting.  No fevers.  Objective:   Vitals:   04/13/23 0620 04/13/23 1227 04/14/23 0431 04/14/23 0827  BP: (!) 143/94 (!) 162/95 (!) 163/107   Pulse: 77 81 82   Resp: 18 20 18    Temp: 97.8 F (36.6 C) 98.1 F (36.7 C) 98.4 F (36.9 C)   TempSrc: Oral  Oral   SpO2: 95% 95% 95% 96%  Weight:      Height:        Intake/Output Summary (Last 24 hours) at 04/14/2023 0957 Last data filed at 04/14/2023 0427 Gross per 24 hour  Intake 172.82 ml  Output 3050 ml  Net -2877.18 ml     Wt Readings from Last 3 Encounters:  04/12/23 116.3 kg  03/31/23 70.3 kg  03/04/23 70.3 kg     Exam General: Somewhat lethargic but easily arousable and responds appropriately Cardiovascular: S1 S2 auscultated,  RRR Respiratory: Diffuse wheezing bilaterally Gastrointestinal: Soft, nontender, nondistended, + bowel sounds Ext: no pedal edema bilaterally Neuro: no new deficits Psych: somewhat lethargic    Data Reviewed:  I have personally reviewed following labs    CBC Lab Results   Component Value Date   WBC 9.4 04/11/2023   RBC 4.07 (L) 04/11/2023   HGB 12.7 (L) 04/11/2023   HCT 37.6 (L) 04/11/2023   MCV 92.4 04/11/2023   MCH 31.2 04/11/2023   PLT 231 04/11/2023   MCHC 33.8 04/11/2023   RDW 13.3  04/11/2023   LYMPHSABS 1.2 04/10/2023   MONOABS 1.2 (H) 04/10/2023   EOSABS 0.2 04/10/2023   BASOSABS 0.0 04/10/2023     Last metabolic panel Lab Results  Component Value Date   NA 133 (L) 04/11/2023   K 3.6 04/11/2023   CL 102 04/11/2023   CO2 24 04/11/2023   BUN 16 04/11/2023   CREATININE 0.84 04/11/2023   GLUCOSE 125 (H) 04/11/2023   GFRNONAA >60 04/11/2023   GFRAA >60 01/21/2016   CALCIUM 8.5 (L) 04/11/2023   PHOS 3.7 04/11/2023   PROT 6.5 04/11/2023   ALBUMIN 3.3 (L) 04/11/2023   BILITOT 0.7 04/11/2023   ALKPHOS 92 04/11/2023   AST 12 (L) 04/11/2023   ALT 15 04/11/2023   ANIONGAP 7 04/11/2023    CBG (last 3)  No results for input(s): "GLUCAP" in the last 72 hours.    Coagulation Profile: No results for input(s): "INR", "PROTIME" in the last 168 hours.   Radiology Studies: I have personally reviewed the imaging studies  No results found.     Thad Ranger M.D. [facility] 04/14/2023, 9:57 AM  Available via Epic secure chat 7am-7pm After 7 pm, please refer to night coverage provider listed on amion.

## 2023-04-14 NOTE — Progress Notes (Signed)
Mobility Specialist - Progress Note   04/14/23 1316  Mobility  Activity Transferred from bed to chair  Level of Assistance Contact guard assist, steadying assist  Assistive Device Front wheel walker  Distance Ambulated (ft) 2 ft  Activity Response Tolerated well  Mobility Referral Yes  $Mobility charge 1 Mobility  Mobility Specialist Start Time (ACUTE ONLY) 0101  Mobility Specialist Stop Time (ACUTE ONLY) 0113  Mobility Specialist Time Calculation (min) (ACUTE ONLY) 12 min   Pt received in bed and agreeable to mobility. Once standing pt became unsteady. Halted further ambulation and assisted pt to recliner. Pt was CG during transfer, d/t unsteadiness. No complaints during session. Pt to recliner after session with all needs met. Chair alarm on.      [facility]

## 2023-04-15 DIAGNOSIS — Z8673 Personal history of transient ischemic attack (TIA), and cerebral infarction without residual deficits: Secondary | ICD-10-CM | POA: Diagnosis not present

## 2023-04-15 DIAGNOSIS — T83090A Other mechanical complication of cystostomy catheter, initial encounter: Secondary | ICD-10-CM | POA: Diagnosis not present

## 2023-04-15 DIAGNOSIS — N3000 Acute cystitis without hematuria: Secondary | ICD-10-CM | POA: Diagnosis not present

## 2023-04-15 DIAGNOSIS — L03311 Cellulitis of abdominal wall: Secondary | ICD-10-CM | POA: Diagnosis not present

## 2023-04-15 LAB — VITAMIN B1: Vitamin B1 (Thiamine): 157.2 nmol/L (ref 66.5–200.0)

## 2023-04-15 LAB — CULTURE, BLOOD (ROUTINE X 2)
Culture: NO GROWTH
Culture: NO GROWTH
Special Requests: ADEQUATE

## 2023-04-15 MED ORDER — IPRATROPIUM-ALBUTEROL 0.5-2.5 (3) MG/3ML IN SOLN
3.0000 mL | Freq: Two times a day (BID) | RESPIRATORY_TRACT | Status: DC
  Administered 2023-04-15: 3 mL via RESPIRATORY_TRACT
  Filled 2023-04-15 (×2): qty 3

## 2023-04-15 NOTE — Progress Notes (Signed)
[facility]                                                                              Calvin [name], is a 77 y.o. male, DOB - 1945/11/28, UUV:[id number] Admit date - 04/10/2023    Outpatient Primary MD for the patient is [name], MD  LOS - 5  days  Chief Complaint  Patient presents with   Abdominal Pain       Brief summary   Patient is a 77 year old male with history of CVA, neurogenic bladder on SP catheter replaced a week ago, A.fib on Xarelto brought to the ED after caregiver noticed catheter was backed up, and patient was complaining of pain at the insertion site for 2 days and had redness with bad odor in his groin area to his navel and suspected to have cellulitis, urine was very cloudy with bad odor.  In ED, noted to be tender in suprapubic area, rhytidosis showed tachycardia 103, no fever or hypoxia WBCs 10.9  Assessment & Plan    Principal Problem: Catheter-associated urinary tract infection (HCC), Bladder outflow obstruction, Indwelling Foley catheter present -In the setting of neurogenic bladder with suprapubic catheter in place, exchanged a week ago. -Urine culture 10/23 showed showing Proteus mirabilis sensitive to Rocephin (resistant to cefazolin, ciprofloxacin, sensitive to ampicillin and Augmentin) -Started on IV cefepime on 10/23, transitioned to IV Rocephin per sensitivities on 10/26, will treat for 7 days for complicated UTI (can transition to oral ampicillin or Augmentin at discharge)  Active Problems:   Cellulitis of abdominal wall -Improving, continue IV Rocephin, transition to oral Augmentin at discharge    Essential hypertension -BP stable  History of alcohol use, acute metabolic encephalopathy -Patient is alert and oriented today, no acute issues    History of stroke, Mixed hyperlipidemia -Continue Xarelto, statin  Atrial fibrillation -Rate controlled, continue Cardizem, -Continue Xarelto  ?COPD/acute  bronchitis -Wheezing improved, continue DuoNebs   Wound care -Partial-thickness fissure sacrum in the lower gluteal cleft, POA -Wound care following, recommended topical treatment and foam dressing, change every 3 days or as needed swelling  Generalized debility -PT evaluation recommended SNF -Discussed with patient's daughter on the phone.  Erie Noe lives in [place] and had planned to move her father to [place] close to her, has a full-time caregiver and got his apartment ready.  His house in [place] is being sold on Thursday on 10/31.  Initial plan was to move to [place], patient cannot fly in the plane and was to ride with the caregiver.  However given this hospitalization, she requested short-term rehab for few weeks to gain strength back, PT OT and finish antibiotics and then moved to [place]. -TOC updated.  Nutrition Problem: Increased nutrient needs Etiology: chronic illness Signs/Symptoms: estimated needs Interventions: Ensure Enlive (each supplement provides 350kcal and 20 grams of protein)  Obesity Estimated body mass index is 34.77 kg/m as calculated from the following:   Height as of this encounter: 6' (1.829 m).   Weight as of this encounter: 116.3 kg.  Code Status: Full CODE STATUS DVT Prophylaxis:  SCDs Start: 04/11/23 0815 rivaroxaban (XARELTO) tablet 20 mg   Level  of Care: Level of care: Telemetry Family Communication: Updated patient's daughter, Donneta Romberg on phone Disposition Plan:      Remains inpatient appropriate:      Procedures:  None  Consultants:   None  Antimicrobials:   Anti-infectives (From admission, onward)    Start     Dose/Rate Route Frequency Ordered Stop   04/13/23 1400  cefTRIAXone (ROCEPHIN) 1 g in sodium chloride 0.9 % 100 mL IVPB        1 g 200 mL/hr over 30 Minutes Intravenous Every 24 hours 04/13/23 0958     04/12/23 1400  ceFAZolin (ANCEF) IVPB 2g/100 mL premix  Status:  Discontinued        2 g 200 mL/hr over 30 Minutes  Intravenous Every 8 hours 04/12/23 1146 04/13/23 0958   04/10/23 2200  vancomycin (VANCOREADY) IVPB 1500 mg/300 mL  Status:  Discontinued        1,500 mg 150 mL/hr over 120 Minutes Intravenous Every 24 hours 04/10/23 2132 04/12/23 1146   04/10/23 2200  ceFEPIme (MAXIPIME) 2 g in sodium chloride 0.9 % 100 mL IVPB  Status:  Discontinued        2 g 200 mL/hr over 30 Minutes Intravenous Every 8 hours 04/10/23 2132 04/12/23 1146   04/10/23 2015  cefTRIAXone (ROCEPHIN) 1 g in sodium chloride 0.9 % 100 mL IVPB        1 g 200 mL/hr over 30 Minutes Intravenous  Once 04/10/23 2001 04/10/23 2104          Medications  atorvastatin  20 mg Oral q1800   diltiazem  180 mg Oral Daily   feeding supplement  237 mL Oral BID BM   finasteride  5 mg Oral Daily   folic acid  1 mg Oral Daily   ipratropium-albuterol  3 mL Nebulization BID   labetalol  20 mg Intravenous Once   multivitamin with minerals  1 tablet Oral Daily   naLOXone (NARCAN)  injection  0.4 mg Intravenous Once   rivaroxaban  20 mg Oral Q supper   sertraline  100 mg Oral Daily   tamsulosin  0.4 mg Oral Daily   thiamine  100 mg Oral Daily      Subjective:   Calvin [name] was seen and examined today.  Much more alert and awake today, oriented, no significant wheezing.  No fevers, chest pain or shortness of breath.  Objective:   Vitals:   04/14/23 2007 04/14/23 2014 04/15/23 0437 04/15/23 1014  BP:  (!) 151/98 (!) 173/101 (!) 176/97  Pulse:  82 87 97  Resp:  18 18 14   Temp:  97.6 F (36.4 C) 98.3 F (36.8 C) 98.3 F (36.8 C)  TempSrc:  Oral  Oral  SpO2: 96% 97% 95% 96%  Weight:      Height:        Intake/Output Summary (Last 24 hours) at 04/15/2023 1204 Last data filed at 04/15/2023 0830 Gross per 24 hour  Intake 600 ml  Output 3650 ml  Net -3050 ml     Wt Readings from Last 3 Encounters:  04/12/23 116.3 kg  03/31/23 70.3 kg  03/04/23 70.3 kg    Physical Exam General: Alert and oriented x 3,  NAD Cardiovascular: S1 S2 clear, RRR.  Respiratory: CTAB, no wheezing Gastrointestinal: Soft, nontender, nondistended, NBS Ext: no pedal edema bilaterally Neuro: no new deficits Psych: Normal affect     Data Reviewed:  I have personally reviewed following labs    CBC Lab Results  Component Value Date   WBC 7.2 04/14/2023   RBC 4.20 (L) 04/14/2023   HGB 13.3 04/14/2023   HCT 39.4 04/14/2023   MCV 93.8 04/14/2023   MCH 31.7 04/14/2023   PLT 282 04/14/2023   MCHC 33.8 04/14/2023   RDW 13.2 04/14/2023   LYMPHSABS 1.2 04/10/2023   MONOABS 1.2 (H) 04/10/2023   EOSABS 0.2 04/10/2023   BASOSABS 0.0 04/10/2023     Last metabolic panel Lab Results  Component Value Date   NA 132 (L) 04/14/2023   K 3.5 04/14/2023   CL 102 04/14/2023   CO2 24 04/14/2023   BUN 13 04/14/2023   CREATININE 0.72 04/14/2023   GLUCOSE 172 (H) 04/14/2023   GFRNONAA >60 04/14/2023   GFRAA >60 01/21/2016   CALCIUM 8.8 (L) 04/14/2023   PHOS 3.7 04/11/2023   PROT 6.5 04/11/2023   ALBUMIN 3.3 (L) 04/11/2023   BILITOT 0.7 04/11/2023   ALKPHOS 92 04/11/2023   AST 12 (L) 04/11/2023   ALT 15 04/11/2023   ANIONGAP 6 04/14/2023    CBG (last 3)  No results for input(s): "GLUCAP" in the last 72 hours.    Coagulation Profile: No results for input(s): "INR", "PROTIME" in the last 168 hours.   Radiology Studies: I have personally reviewed the imaging studies  No results found.     Thad Ranger M.D. [facility] 04/15/2023, 12:04 PM  Available via Calvin secure chat 7am-7pm After 7 pm, please refer to night coverage provider listed on amion.

## 2023-04-15 NOTE — Plan of Care (Signed)
  Problem: Clinical Measurements: Goal: Ability to avoid or minimize complications of infection will improve Outcome: Progressing   Problem: Skin Integrity: Goal: Skin integrity will improve Outcome: Progressing   Problem: Education: Goal: Knowledge of General Education information will improve Description Including pain rating scale, medication(s)/side effects and non-pharmacologic comfort measures Outcome: Progressing   Problem: Health Behavior/Discharge Planning: Goal: Ability to manage health-related needs will improve Outcome: Progressing   Problem: Clinical Measurements: Goal: Ability to maintain clinical measurements within normal limits will improve Outcome: Progressing Goal: Will remain free from infection Outcome: Progressing Goal: Diagnostic test results will improve Outcome: Progressing Goal: Respiratory complications will improve Outcome: Progressing Goal: Cardiovascular complication will be avoided Outcome: Progressing   Problem: Activity: Goal: Risk for activity intolerance will decrease Outcome: Progressing   Problem: Nutrition: Goal: Adequate nutrition will be maintained Outcome: Progressing   Problem: Coping: Goal: Level of anxiety will decrease Outcome: Progressing   Problem: Elimination: Goal: Will not experience complications related to bowel motility Outcome: Progressing Goal: Will not experience complications related to urinary retention Outcome: Progressing   Problem: Safety: Goal: Ability to remain free from injury will improve Outcome: Progressing   Problem: Skin Integrity: Goal: Risk for impaired skin integrity will decrease Outcome: Progressing

## 2023-04-15 NOTE — Plan of Care (Signed)
  Problem: Clinical Measurements: Goal: Ability to avoid or minimize complications of infection will improve Outcome: Progressing   Problem: Education: Goal: Knowledge of General Education information will improve Description: Including pain rating scale, medication(s)/side effects and non-pharmacologic comfort measures Outcome: Progressing   Problem: Clinical Measurements: Goal: Ability to maintain clinical measurements within normal limits will improve Outcome: Progressing   Problem: Activity: Goal: Risk for activity intolerance will decrease Outcome: Progressing   Problem: Coping: Goal: Level of anxiety will decrease Outcome: Progressing   Problem: Elimination: Goal: Will not experience complications related to bowel motility Outcome: Progressing   Problem: Pain Management: Goal: General experience of comfort will improve Outcome: Progressing   Problem: Safety: Goal: Ability to remain free from injury will improve Outcome: Progressing   Problem: Skin Integrity: Goal: Risk for impaired skin integrity will decrease Outcome: Progressing

## 2023-04-15 NOTE — TOC Progression Note (Addendum)
Transition of Care [facility]) - Progression Note    Patient Details  Name: Calvin [name] [name]: [id number] Date of Birth: 10/29/1945  [facility] CM/SW Contact  Otelia Santee, LCSW Phone Number: 04/15/2023, 12:49 PM  Clinical Narrative:    CSW left voicemail with pt's daughter to review bed offers for SNF. Will await return call.  ADDENDUM: Spoke with daughter via t/c who has accepted bed offer for [facility]. Insurance Berkley Harvey has been requested and is currently pending.    Expected Discharge Plan: Skilled Nursing Facility Barriers to Discharge: Continued Medical Work up  Expected Discharge Plan and Services In-house Referral: Clinical Social Work Discharge Planning Services: NA Post Acute Care Choice:  (Unsure if wants SNF or to return home) Living arrangements for the past 2 months: Single Family Home                                       Social Determinants of Health (SDOH) Interventions SDOH Screenings   Food Insecurity: No Food Insecurity (04/11/2023)  Housing: Low Risk  (04/11/2023)  Transportation Needs: No Transportation Needs (04/11/2023)  Utilities: Not At Risk (04/11/2023)  Alcohol Screen: Medium Risk (04/14/2020)  Depression (PHQ2-9): Low Risk  (06/28/2022)  Financial Resource Strain: Low Risk  (04/14/2020)  Physical Activity: Inactive (03/08/2021)  Social Connections: Socially Isolated (03/08/2021)  Stress: No Stress Concern Present (04/14/2020)  Tobacco Use: Low Risk  (04/10/2023)    Readmission Risk Interventions    04/12/2023   12:45 PM 01/06/2023   12:32 PM 10/29/2022   12:11 PM  Readmission Risk Prevention Plan  Transportation Screening Complete Complete Complete  PCP or Specialist Appt within 5-7 Days  Complete   PCP or Specialist Appt within 3-5 Days Complete  Complete  Home Care Screening  Complete   Medication Review (RN CM)  Complete   HRI or Home Care Consult Complete  Complete  Social Work Consult for Recovery  Care Planning/Counseling Complete  Complete  Palliative Care Screening Complete  Not Applicable  Medication Review Ocean[facility]) Complete  Complete

## 2023-04-15 NOTE — TOC CM/SW Note (Signed)
 CMS list of facilities and star ratings provided to pt to review for facility preference.       [facility] [address] [place], [place] [address] [phone] Overall rating ??  Below average  [facility] at the [facility] [address] [place], [place] [address] [phone] Overall rating ?? Below average  [facility] [address] [place], [place] [address] [phone] Overall rating?? Below average  [facility] [address] [place], [place] [address] [phone] Overall rating ? Much below average  [facility] [address] [place], [place] [address] [phone] Overall rating ??? Average  [facility] and [facility] [address] [place], [place] [address] [phone] Overall rating ? Much below average   [facility] [address] [place], [place] [address] [phone] Overall rating ? Much below average  [employer] [facility] [employer] [address] [place], [place] [address] [phone] Overall rating ??? Average  [facility] [address] [place], [place] [address] [phone] Overall rating ? Much below average  [facility] and [facility] [address] [place], [place] [address] [phone] Overall rating ? Much below average  [facility] [address] [place], [place] [address] [phone] Overall rating ???? Above average  [facility] [address] [place], [place] [address] [phone] Overall rating ????? Much above average  [facility] [address] [place], [place] [address] [phone] Overall rating ???? Above average  [facility] [address] [place], [place]  [address] [phone] Overall rating ????? Much above average  [facility] [address] [place], [place] [address] [phone] Overall rating ????  [facility] [address] [place], [place] [address] [phone] Overall rating ????? Much above average  [place] [facility] [address] [place], [place] [address] [phone] Overall rating ????? Much above average  [facility] [address] [place], [place] [address] [phone] Overall rating ? Much below average  [facility] [address] [place], [place] [address] [phone] Overall rating ??? Average  [facility] [address] [place], [place] [address] [phone] Overall rating ????? Much above average  [facility] [address] [place], [place] [address] [phone] Overall rating ??? Average  [facility] [address] [place], [place] [address] [phone] Overall rating ?? Below average  [facility] [address] [place], [place] [address] [phone] Overall rating ??? Average  [facility] [address] [place], [place] [address] [phone] Overall rating ????? Much above average  [facility] [address] [place], [place] [address] [phone] Overall rating ? Much below average  [facility]	[place] at the [facility] at [facility], [place] [address] [phone] Overall rating ????? Much above average  [facility] [address] [place], [place] [address] [phone] Overall rating ? Much below average  [facility] [address] [place], [place] [address] [phone] Overall rating ??? Average  [facility] and  [facility] [address] [place], [place] [address] [phone] Overall rating ??  Below average  [facility] [address] [place], [place] [address] [phone] Overall rating ? Much below average  [facility] [facility] [address] [place], [place] [address] [phone] Overall rating ??? Average  [facility] [address] [place], [place] [address] [phone] Overall rating ? Much below average  [facility] [address] [place], [place] [address] [phone] Overall rating ??? Average  [facility] [address] [place], [place] [address] [phone] Overall rating ? Much below average  [place] [address] [place], [place] [address] [phone] Overall rating ????? Much above average  [facility] [address] [place], [place] [address] [phone] Overall rating ? Much below average  [facility] [address] [place], [place] [address] [phone] Overall rating ???? Above average  [facility] [address] [place], [place] [address] [phone] Overall rating ????? Above average  [facility] and [facility] [address] [place], [place] [address] [phone] Overall rating ???? Above average  [facility] [address] [place], [place] [address] [phone] Overall rating ????? Much above average  [facility] [address] [place], [place] [address] [phone] Overall rating ? Much below average  [facility] [address] [place], [place] [address] [phone] Overall rating ????? Much above average  [facility] [address] [place], [place] [address] [phone] Overall rating ? Much below  average  [facility] [address] [place], [place] [address] [phone] Overall rating ????? Much above average  [facility] [address] [place], [place] [address] [phone] Overall rating ????? Much above average  [facility] [address] [place], [place] [address] [phone] Overall rating ??? Average  [facility] [address] [place], [place] [address] [phone] Overall rating ??? Average  [facility] [address] [place], [place] [address] [phone] Overall rating ???? Above average  [facility] [address] [place], [place] [address] [phone] Overall rating ?? Below average  [facility] [address] [place], [place] [address] [phone] Overall rating ???? Above average  [facility] [address] [place], [place] [address] [phone] Overall rating ?? Below average  [facility] [address] [place], [place] [address] [phone] Overall rating ??

## 2023-04-15 NOTE — Progress Notes (Signed)
PT Cancellation Note  Patient Details Name: Calvin [name] [name]: [id number] DOB: June 11, 1946   Cancelled Treatment:    Reason Eval/Treat Not Completed: Medical issues which prohibited therapy  Pt vomiting this morning, will f/u as able.  Anise Salvo, PT Acute Rehab [facility] [phone]  Rayetta Humphrey 04/15/2023, 12:09 PM

## 2023-04-15 NOTE — Progress Notes (Signed)
Physical Therapy Treatment Patient Details Name: Calvin [name] [name]: [id number] DOB: 1946/01/27 Today's Date: 04/15/2023   History of Present Illness Marciano Mega is a 77 yo male admitted with catheter associated UTI and abdominal cellulitis on 04/10/23. PMH: CVA, neurogenic bladder with suprapubic catheter replaced a week ago, afib    PT Comments  Pt reports not feeling well today with several episodes of vomiting.  He required less assist than last session but only tolerated taking a few steps in room due to nausea and fatigue.  Will continue to benefit from PT and recommendation that Patient will benefit from continued inpatient follow up therapy, <3 hours/day at d/c    If plan is discharge home, recommend the following: Assistance with cooking/housework;Assist for transportation;A little help with walking and/or transfers;A little help with bathing/dressing/bathroom   Can travel by private vehicle     Yes  Equipment Recommendations  None recommended by PT    Recommendations for Other Services       Precautions / Restrictions Precautions Precautions: Fall Precaution Comments: suprapubic catheter     Mobility  Bed Mobility Overal bed mobility: Needs Assistance Bed Mobility: Sit to Supine       Sit to supine: Min assist        Transfers Overall transfer level: Needs assistance Equipment used: Rolling walker (2 wheels) Transfers: Sit to/from Stand Sit to Stand: Min assist           General transfer comment: REquired 2 attempts, cues for hand placement, min A to rise    Ambulation/Gait Ambulation/Gait assistance: Min assist Gait Distance (Feet): 5 Feet Assistive device: Rolling walker (2 wheels) Gait Pattern/deviations: Step-to pattern, Decreased stride length Gait velocity: decreased     General Gait Details: Cues for posture and RW management; distance limited due to nausea and pt just wanting to return to bed (has been vomiting today)   Stairs              Wheelchair Mobility     Tilt Bed    Modified Rankin (Stroke Patients Only)       Balance Overall balance assessment: Needs assistance Sitting-balance support: Feet supported, No upper extremity supported Sitting balance-Leahy Scale: Good     Standing balance support: Bilateral upper extremity supported, During functional activity, Reliant on assistive device for balance Standing balance-Leahy Scale: Poor Standing balance comment: Steady wtih RW                            Cognition Arousal: Alert Behavior During Therapy: Flat affect Overall Cognitive Status: Within Functional Limits for tasks assessed                                 General Comments: not formally assessed- following basic command        Exercises      General Comments General comments (skin integrity, edema, etc.): VSS      Pertinent Vitals/Pain Pain Assessment Pain Assessment: No/denies pain    Home Living                          Prior Function            PT Goals (current goals can now be found in the care plan section) Progress towards PT goals: Progressing toward goals    Frequency    Min 1X/week  PT Plan      Co-evaluation              AM-PAC PT "6 Clicks" Mobility   Outcome Measure  Help needed turning from your back to your side while in a flat bed without using bedrails?: A Little Help needed moving from lying on your back to sitting on the side of a flat bed without using bedrails?: A Little Help needed moving to and from a bed to a chair (including a wheelchair)?: A Little Help needed standing up from a chair using your arms (e.g., wheelchair or bedside chair)?: A Lot Help needed to walk in hospital room?: Total Help needed climbing 3-5 steps with a railing? : Total 6 Click Score: 13    End of Session Equipment Utilized During Treatment: Gait belt Activity Tolerance: Patient tolerated treatment well Patient  left: in bed;with call bell/phone within reach;with bed alarm set Nurse Communication: Mobility status PT Visit Diagnosis: Other abnormalities of gait and mobility (R26.89);Muscle weakness (generalized) (M62.81);Difficulty in walking, not elsewhere classified (R26.2)     Time: 5956-3875 PT Time Calculation (min) (ACUTE ONLY): 13 min  Charges:    $Therapeutic Activity: 8-22 mins PT General Charges $$ ACUTE PT VISIT: 1 Visit                     Anise Salvo, PT Acute Rehab Services [facility] [phone]    Rayetta Humphrey 04/15/2023, 4:00 PM

## 2023-04-16 DIAGNOSIS — L03311 Cellulitis of abdominal wall: Secondary | ICD-10-CM | POA: Diagnosis not present

## 2023-04-16 DIAGNOSIS — N3000 Acute cystitis without hematuria: Secondary | ICD-10-CM | POA: Diagnosis not present

## 2023-04-16 DIAGNOSIS — N320 Bladder-neck obstruction: Secondary | ICD-10-CM | POA: Diagnosis not present

## 2023-04-16 DIAGNOSIS — G9341 Metabolic encephalopathy: Secondary | ICD-10-CM | POA: Diagnosis not present

## 2023-04-16 MED ORDER — LOSARTAN POTASSIUM 50 MG PO TABS
100.0000 mg | ORAL_TABLET | Freq: Every day | ORAL | Status: DC
  Administered 2023-04-16 – 2023-04-18 (×3): 100 mg via ORAL
  Filled 2023-04-16 (×3): qty 2

## 2023-04-16 MED ORDER — ALBUTEROL SULFATE (2.5 MG/3ML) 0.083% IN NEBU: 2.5000 mg | INHALATION_SOLUTION | RESPIRATORY_TRACT | Status: DC | PRN

## 2023-04-16 NOTE — Plan of Care (Signed)
  Problem: Clinical Measurements: Goal: Ability to avoid or minimize complications of infection will improve Outcome: Progressing   Problem: Skin Integrity: Goal: Skin integrity will improve Outcome: Progressing   Problem: Education: Goal: Knowledge of General Education information will improve Description: Including pain rating scale, medication(s)/side effects and non-pharmacologic comfort measures Outcome: Progressing   Problem: Health Behavior/Discharge Planning: Goal: Ability to manage health-related needs will improve Outcome: Progressing   Problem: Clinical Measurements: Goal: Respiratory complications will improve Outcome: Progressing   Problem: Clinical Measurements: Goal: Will remain free from infection Outcome: Progressing   Problem: Pain Management: Goal: General experience of comfort will improve Outcome: Progressing

## 2023-04-16 NOTE — TOC Progression Note (Signed)
Transition of Care [facility]) - Progression Note    Patient Details  Name: Calvin [name] [name]: [id number] Date of Birth: Sep 09, 1945  [facility]) CM/SW Contact  Otelia Santee, LCSW Phone Number: 04/16/2023, 2:41 PM  Clinical Narrative:    Pt's insurance auth still pending. Pt able to transfer to SNF once insurance has approved placement.    Expected Discharge Plan: Skilled Nursing Facility Barriers to Discharge: Continued Medical Work up  Expected Discharge Plan and Services In-house Referral: Clinical Social Work Discharge Planning Services: NA Post Acute Care Choice:  (Unsure if wants SNF or to return home) Living arrangements for the past 2 months: Single Family Home                                       Social Determinants of Health (SDOH) Interventions SDOH Screenings   Food Insecurity: No Food Insecurity (04/11/2023)  Housing: Low Risk  (04/11/2023)  Transportation Needs: No Transportation Needs (04/11/2023)  Utilities: Not At Risk (04/11/2023)  Alcohol Screen: Medium Risk (04/14/2020)  Depression (PHQ2-9): Low Risk  (06/28/2022)  Financial Resource Strain: Low Risk  (04/14/2020)  Physical Activity: Inactive (03/08/2021)  Social Connections: Socially Isolated (03/08/2021)  Stress: No Stress Concern Present (04/14/2020)  Tobacco Use: Low Risk  (04/10/2023)    Readmission Risk Interventions    04/12/2023   12:45 PM 01/06/2023   12:32 PM 10/29/2022   12:11 PM  Readmission Risk Prevention Plan  Transportation Screening Complete Complete Complete  PCP or Specialist Appt within 5-7 Days  Complete   PCP or Specialist Appt within 3-5 Days Complete  Complete  Home Care Screening  Complete   Medication Review (RN CM)  Complete   HRI or Home Care Consult Complete  Complete  Social Work Consult for Recovery Care Planning/Counseling Complete  Complete  Palliative Care Screening Complete  Not Applicable  Medication Review Oceanographer) Complete   Complete

## 2023-04-16 NOTE — Telephone Encounter (Signed)
Left message for Calvin [name] advised that pt is currently in the hospital. Advised to call back after pt is discharge from the hospital if still need the equipments ordered

## 2023-04-16 NOTE — Progress Notes (Signed)
[facility]                                                                              Calvin [name], is a 77 y.o. male, DOB - 1946-02-12, ZOX:[id number] Admit date - 04/10/2023    Outpatient Primary MD for the patient is Calvin Salisbury, MD  LOS - 6  days  Chief Complaint  Patient presents with   Abdominal Pain       Brief summary   Patient is a 77 year old male with history of CVA, neurogenic bladder on SP catheter replaced a week ago, A.fib on Xarelto brought to the ED after caregiver noticed catheter was backed up, and patient was complaining of pain at the insertion site for 2 days and had redness with bad odor in his groin area to his navel and suspected to have cellulitis, urine was very cloudy with bad odor.  In ED, noted to be tender in suprapubic area, rhytidosis showed tachycardia 103, no fever or hypoxia WBCs 10.9  Assessment & Plan    Principal Problem: Catheter-associated urinary tract infection (HCC), Bladder outflow obstruction, Indwelling Foley catheter present -In the setting of neurogenic bladder with suprapubic catheter in place, exchanged a week ago. -Urine culture 10/23 showed showing Proteus mirabilis sensitive to Rocephin (resistant to cefazolin, ciprofloxacin, sensitive to ampicillin and Augmentin) -Started on IV cefepime on 10/23, transitioned to IV Rocephin per sensitivities on 10/26, will treat for total 7 days for complicated UTI.   Active Problems:   Cellulitis of abdominal wall -Improving, continue IV Rocephin    Essential hypertension -BP readings elevated, resume losartan, cont cardizem  History of alcohol use, acute metabolic encephalopathy -Patient is alert and oriented today, no acute issues    History of stroke, Mixed hyperlipidemia -Continue Xarelto, statin  Atrial fibrillation -Rate controlled, continue Cardizem, -Continue Xarelto  ?COPD/acute bronchitis -Wheezing improved, continue DuoNebs   Wound  care -Partial-thickness fissure sacrum in the lower gluteal cleft, POA -Wound care following, recommended topical treatment and foam dressing, change every 3 days or as needed swelling  Generalized debility -PT evaluation recommended SNF -Discussed with patient's daughter Erie Noe on phone on 10/28, she lives in [place] and had planned to move her [place] to [place] close to her, has a full-time caregiver and got his apartment ready.  His house in [place] is being sold on Thursday on 10/31.  Initial plan was to move to [place], patient cannot fly in the plane and was to ride with the caregiver.  However given this hospitalization, she requested short-term rehab for few weeks to gain strength back, PT OT and finish antibiotics and then moved to [place]. -TOC updated.  Nutrition Problem: Increased nutrient needs Etiology: chronic illness Signs/Symptoms: estimated needs Interventions: Ensure Enlive (each supplement provides 350kcal and 20 grams of protein)  Obesity Estimated body mass index is 34.77 kg/m as calculated from the following:   Height as of this encounter: 6' (1.829 m).   Weight as of this encounter: 116.3 kg.  Code Status: Full CODE STATUS DVT Prophylaxis:  SCDs Start: 04/11/23 0815 rivaroxaban (XARELTO) tablet 20 mg   Level of Care: Level of care: Telemetry Family  Communication: Updated patient's daughter, Calvin [name] on phone  on 10/28 Disposition Plan:      Remains inpatient appropriate:   Medically stable, dc to SNF when bed available, pending insurance auth.    Procedures:  None  Consultants:   None  Antimicrobials:   Anti-infectives (From admission, onward)    Start     Dose/Rate Route Frequency Ordered Stop   04/13/23 1400  cefTRIAXone (ROCEPHIN) 1 g in sodium chloride 0.9 % 100 mL IVPB        1 g 200 mL/hr over 30 Minutes Intravenous Every 24 hours 04/13/23 0958 04/17/23 2359   04/12/23 1400  ceFAZolin (ANCEF) IVPB 2g/100 mL premix  Status:   Discontinued        2 g 200 mL/hr over 30 Minutes Intravenous Every 8 hours 04/12/23 1146 04/13/23 0958   04/10/23 2200  vancomycin (VANCOREADY) IVPB 1500 mg/300 mL  Status:  Discontinued        1,500 mg 150 mL/hr over 120 Minutes Intravenous Every 24 hours 04/10/23 2132 04/12/23 1146   04/10/23 2200  ceFEPIme (MAXIPIME) 2 g in sodium chloride 0.9 % 100 mL IVPB  Status:  Discontinued        2 g 200 mL/hr over 30 Minutes Intravenous Every 8 hours 04/10/23 2132 04/12/23 1146   04/10/23 2015  cefTRIAXone (ROCEPHIN) 1 g in sodium chloride 0.9 % 100 mL IVPB        1 g 200 mL/hr over 30 Minutes Intravenous  Once 04/10/23 2001 04/10/23 2104          Medications  atorvastatin  20 mg Oral q1800   diltiazem  180 mg Oral Daily   feeding supplement  237 mL Oral BID BM   finasteride  5 mg Oral Daily   folic acid  1 mg Oral Daily   labetalol  20 mg Intravenous Once   multivitamin with minerals  1 tablet Oral Daily   naLOXone (NARCAN)  injection  0.4 mg Intravenous Once   rivaroxaban  20 mg Oral Q supper   sertraline  100 mg Oral Daily   tamsulosin  0.4 mg Oral Daily   thiamine  100 mg Oral Daily      Subjective:   [name] was seen and examined today.  Alert and oriented, just feels weak overall. No fevers, dyspnea or pain. BP elevated.   Objective:   Vitals:   04/16/23 0225 04/16/23 0311 04/16/23 1035 04/16/23 1402  BP: (!) 179/114 (!) 151/80 (!) 159/99 (!) 162/109  Pulse:  89  82  Resp:    19  Temp:    97.9 F (36.6 C)  TempSrc:      SpO2:    96%  Weight:      Height:        Intake/Output Summary (Last 24 hours) at 04/16/2023 1749 Last data filed at 04/16/2023 0504 Gross per 24 hour  Intake 1500 ml  Output 1325 ml  Net 175 ml     Wt Readings from Last 3 Encounters:  04/12/23 116.3 kg  03/31/23 70.3 kg  03/04/23 70.3 kg    Physical Exam General: Alert and oriented x 3, NAD Cardiovascular: S1 S2 clear, RRR.  Respiratory: CTAB, no  wheezing Gastrointestinal: Soft, nontender, nondistended, NBS Ext: no pedal edema bilaterally Neuro: no new deficits Psych: Normal affect, appears deconditioned     Data Reviewed:  I have personally reviewed following labs    CBC Lab Results  Component Value Date   WBC 7.2  04/14/2023   RBC 4.20 (L) 04/14/2023   HGB 13.3 04/14/2023   HCT 39.4 04/14/2023   MCV 93.8 04/14/2023   MCH 31.7 04/14/2023   PLT 282 04/14/2023   MCHC 33.8 04/14/2023   RDW 13.2 04/14/2023   LYMPHSABS 1.2 04/10/2023   MONOABS 1.2 (H) 04/10/2023   EOSABS 0.2 04/10/2023   BASOSABS 0.0 04/10/2023     Last metabolic panel Lab Results  Component Value Date   NA 132 (L) 04/14/2023   K 3.5 04/14/2023   CL 102 04/14/2023   CO2 24 04/14/2023   BUN 13 04/14/2023   CREATININE 0.72 04/14/2023   GLUCOSE 172 (H) 04/14/2023   GFRNONAA >60 04/14/2023   GFRAA >60 01/21/2016   CALCIUM 8.8 (L) 04/14/2023   PHOS 3.7 04/11/2023   PROT 6.5 04/11/2023   ALBUMIN 3.3 (L) 04/11/2023   BILITOT 0.7 04/11/2023   ALKPHOS 92 04/11/2023   AST 12 (L) 04/11/2023   ALT 15 04/11/2023   ANIONGAP 6 04/14/2023    CBG (last 3)  No results for input(s): "GLUCAP" in the last 72 hours.    Coagulation Profile: No results for input(s): "INR", "PROTIME" in the last 168 hours.   Radiology Studies: I have personally reviewed the imaging studies  No results found.     Thad Ranger M.D. [facility] 04/16/2023, 5:49 PM  Available via Epic secure chat 7am-7pm After 7 pm, please refer to night coverage provider listed on amion.

## 2023-04-17 DIAGNOSIS — T83090A Other mechanical complication of cystostomy catheter, initial encounter: Secondary | ICD-10-CM | POA: Diagnosis not present

## 2023-04-17 LAB — CBC
HCT: 39.6 % (ref 39.0–52.0)
Hemoglobin: 13.5 g/dL (ref 13.0–17.0)
MCH: 31.8 pg (ref 26.0–34.0)
MCHC: 34.1 g/dL (ref 30.0–36.0)
MCV: 93.2 fL (ref 80.0–100.0)
Platelets: 310 10*3/uL (ref 150–400)
RBC: 4.25 MIL/uL (ref 4.22–5.81)
RDW: 13.4 % (ref 11.5–15.5)
WBC: 9.6 10*3/uL (ref 4.0–10.5)
nRBC: 0 % (ref 0.0–0.2)

## 2023-04-17 LAB — BASIC METABOLIC PANEL
Anion gap: 10 (ref 5–15)
BUN: 18 mg/dL (ref 8–23)
CO2: 25 mmol/L (ref 22–32)
Calcium: 9.3 mg/dL (ref 8.9–10.3)
Chloride: 99 mmol/L (ref 98–111)
Creatinine, Ser: 0.78 mg/dL (ref 0.61–1.24)
GFR, Estimated: >60 mL/min (ref >60)
Glucose, Bld: 106 mg/dL — ABNORMAL HIGH (ref 70–99)
Potassium: 3.7 mmol/L (ref 3.5–5.1)
Sodium: 134 mmol/L — ABNORMAL LOW (ref 135–145)

## 2023-04-17 MED ORDER — SENNOSIDES-DOCUSATE SODIUM 8.6-50 MG PO TABS
2.0000 | ORAL_TABLET | Freq: Two times a day (BID) | ORAL | Status: DC
  Administered 2023-04-17 – 2023-04-18 (×3): 2 via ORAL
  Filled 2023-04-17 (×3): qty 2

## 2023-04-17 MED ORDER — POLYETHYLENE GLYCOL 3350 17 G PO PACK
17.0000 g | PACK | Freq: Every day | ORAL | Status: DC
  Administered 2023-04-17 – 2023-04-18 (×2): 17 g via ORAL
  Filled 2023-04-17 (×2): qty 1

## 2023-04-17 MED ORDER — MUPIROCIN CALCIUM 2 % EX CREA
TOPICAL_CREAM | Freq: Two times a day (BID) | CUTANEOUS | Status: DC
  Filled 2023-04-17: qty 15

## 2023-04-17 MED ORDER — FOSFOMYCIN TROMETHAMINE 3 G PO PACK
3.0000 g | PACK | Freq: Once | ORAL | Status: AC
  Administered 2023-04-17: 3 g via ORAL
  Filled 2023-04-17: qty 3

## 2023-04-17 NOTE — TOC Progression Note (Addendum)
Transition of Care [facility]) - Progression Note    Patient Details  Name: Calvin [name] [name]: [id number] Date of Birth: Oct 25, 1945  [facility]) CM/SW Contact  Otelia Santee, LCSW Phone Number: 04/17/2023, 9:09 AM  Clinical Narrative:    Insurance auth still pending.   ADDENDUM: Chief Executive Officer Peer to Peer for SNF authorization. Provider to call in to [phone] option 3. Peer to peer has to be completed by 10/31 at 12pm. MD notified.  Pt may also appeal denial by calling [phone].    Expected Discharge Plan: Skilled Nursing Facility Barriers to Discharge: Continued Medical Work up  Expected Discharge Plan and Services In-house Referral: Clinical Social Work Discharge Planning Services: NA Post Acute Care Choice:  (Unsure if wants SNF or to return home) Living arrangements for the past 2 months: Single Family Home                                       Social Determinants of Health (SDOH) Interventions SDOH Screenings   Food Insecurity: No Food Insecurity (04/11/2023)  Housing: Low Risk  (04/11/2023)  Transportation Needs: No Transportation Needs (04/11/2023)  Utilities: Not At Risk (04/11/2023)  Alcohol Screen: Medium Risk (04/14/2020)  Depression (PHQ2-9): Low Risk  (06/28/2022)  Financial Resource Strain: Low Risk  (04/14/2020)  Physical Activity: Inactive (03/08/2021)  Social Connections: Socially Isolated (03/08/2021)  Stress: No Stress Concern Present (04/14/2020)  Tobacco Use: Low Risk  (04/10/2023)    Readmission Risk Interventions    04/12/2023   12:45 PM 01/06/2023   12:32 PM 10/29/2022   12:11 PM  Readmission Risk Prevention Plan  Transportation Screening Complete Complete Complete  PCP or Specialist Appt within 5-7 Days  Complete   PCP or Specialist Appt within 3-5 Days Complete  Complete  Home Care Screening  Complete   Medication Review (RN CM)  Complete   HRI or Home Care Consult Complete  Complete  Social Work  Consult for Recovery Care Planning/Counseling Complete  Complete  Palliative Care Screening Complete  Not Applicable  Medication Review Oceanographer) Complete  Complete

## 2023-04-17 NOTE — Plan of Care (Signed)
  Problem: Clinical Measurements: Goal: Ability to avoid or minimize complications of infection will improve Outcome: Progressing   Problem: Skin Integrity: Goal: Skin integrity will improve Outcome: Progressing   Problem: Education: Goal: Knowledge of General Education information will improve Description: Including pain rating scale, medication(s)/side effects and non-pharmacologic comfort measures Outcome: Progressing   Problem: Health Behavior/Discharge Planning: Goal: Ability to manage health-related needs will improve Outcome: Progressing   Problem: Clinical Measurements: Goal: Ability to maintain clinical measurements within normal limits will improve Outcome: Progressing Goal: Will remain free from infection Outcome: Progressing Goal: Diagnostic test results will improve Outcome: Progressing Goal: Respiratory complications will improve Outcome: Progressing Goal: Cardiovascular complication will be avoided Outcome: Progressing   Problem: Activity: Goal: Risk for activity intolerance will decrease Outcome: Progressing   Problem: Nutrition: Goal: Adequate nutrition will be maintained Outcome: Progressing   Problem: Coping: Goal: Level of anxiety will decrease Outcome: Progressing   Problem: Elimination: Goal: Will not experience complications related to bowel motility Outcome: Progressing Goal: Will not experience complications related to urinary retention Outcome: Progressing   Problem: Pain Management: Goal: General experience of comfort will improve Outcome: Progressing   Problem: Safety: Goal: Ability to remain free from injury will improve Outcome: Progressing   Problem: Skin Integrity: Goal: Risk for impaired skin integrity will decrease Outcome: Progressing

## 2023-04-17 NOTE — Progress Notes (Signed)
[facility] Progress Note Patient: Calvin [name] ZOX:[id number] DOB: 10/20/1945 DOA: 04/10/2023  DOS: the patient was seen and examined on 04/17/2023  77 yom w/ hx of CVA, neurogenic bladder on SP catheter replaced a week ago, A.fib on Xarelto brought to the ED after caregiver noticed catheter was backed up, and patient was complaining of pain at the insertion site for 2 days  and had redness with bad odor in his groin area to his navel and suspected to have cellulitis, urine was very cloudy with bad odor. Recent urine culture had Proteus resistant to Cipro and Macrobid and a remote really had Klebsiella resistant to penicillin. In the ED abdomen was tender in the suprapubic area, vitals showed mild tachycardia 105 heart rate 160s, not hypoxic, no fever. Labs with leukocytosis 10.9, and stable renal function, UA grossly abnormal, lactic acid normal ammonia normal Blood culture urine culture collected and patient was started on antibiotics and admitted for further management.   Assessment and plan. Catheter associated UTI. Present on admission. Chronic indwelling Foley catheter. Proteus in the urine culture most likely contamination. But patient does have E faecalis. Has been treated with Rocephin. Currently I will also add 1 dose of fosfomycin. Monitor for now.  Suprapubic catheter. Present on admission. Catheter site does appear to have some redness although no evidence of acute purulent discharge or drainage or significant cellulitis. Will treat topically and monitor.  HTN. Blood pressure stable. Continue home regimen.  CVA. HLD. On Xarelto and statin.  We can continue.  Deconditioning. Prior to admission patient was able to ambulate 80 feet without significant assistance. Currently unable to ambulate. Progressively weakening most likely in the setting of ongoing UTIs. Would benefit from SNF.  COPD. Currently no exacerbation.  Monitor.  Subjective: No nausea no  vomiting no fever no chills.  Eager to work with therapy.  Physical Exam: General: in Mild distress, No Rash Cardiovascular: S1 and S2 Present, No Murmur Respiratory: Good respiratory effort, Bilateral Air entry present. No Crackles, No wheezes Abdomen: Bowel Sound present, No tenderness Extremities: Bilateral edema Neuro: Alert and oriented x3, no new focal deficit  Data Reviewed: I have Reviewed nursing notes, Vitals, and Lab results. Since last encounter, pertinent lab results CBC and BMP   . I have ordered test including CBC and BMP  .   Disposition: Status is: Inpatient Remains inpatient appropriate because: and awaiting placement SCDs Start: 04/11/23 0815 rivaroxaban (XARELTO) tablet 20 mg   Family Communication: No one at bedside Level of care: Telemetry   Vitals:   04/16/23 1802 04/16/23 2004 04/17/23 0435 04/17/23 1251  BP: (!) 140/96 (!) 141/89 (!) 159/100 (!) 112/99  Pulse: 85 88 84 87  Resp:  18 18 18   Temp:  98.7 F (37.1 C) 98.2 F (36.8 C) (!) 97.2 F (36.2 C)  TempSrc:  Oral Oral Oral  SpO2:  91% 92% 95%  Weight:      Height:         Author: Lynden Oxford, MD 04/17/2023 7:23 PM  Please look on [email or web address] to find out who is on call.

## 2023-04-17 NOTE — Plan of Care (Signed)
  Problem: Clinical Measurements: Goal: Ability to avoid or minimize complications of infection will improve Outcome: Progressing   Problem: Education: Goal: Knowledge of General Education information will improve Description: Including pain rating scale, medication(s)/side effects and non-pharmacologic comfort measures Outcome: Progressing   Problem: Clinical Measurements: Goal: Will remain free from infection Outcome: Progressing   Problem: Activity: Goal: Risk for activity intolerance will decrease Outcome: Progressing   Problem: Elimination: Goal: Will not experience complications related to bowel motility Outcome: Progressing

## 2023-04-17 NOTE — Progress Notes (Signed)
Mobility Specialist - Progress Note   04/17/23 0928  Mobility  Activity Ambulated with assistance in room;Transferred from bed to chair  Level of Assistance Minimal assist, patient does 75% or more  Assistive Device Front wheel walker  Distance Ambulated (ft) 20 ft  Range of Motion/Exercises Active  Activity Response Tolerated well  Mobility Referral Yes  $Mobility charge 1 Mobility  Mobility Specialist Start Time (ACUTE ONLY) 0912  Mobility Specialist Stop Time (ACUTE ONLY) 0925  Mobility Specialist Time Calculation (min) (ACUTE ONLY) 13 min   Received in bed and agreed to mobility, had no issues throughout session. Min A for bed mobility and STS. Returned to chair with all needs met.  Marilynne Halsted Mobility Specialist

## 2023-04-18 DIAGNOSIS — R5381 Other malaise: Secondary | ICD-10-CM | POA: Diagnosis not present

## 2023-04-18 DIAGNOSIS — G9341 Metabolic encephalopathy: Secondary | ICD-10-CM | POA: Diagnosis not present

## 2023-04-18 DIAGNOSIS — N390 Urinary tract infection, site not specified: Secondary | ICD-10-CM | POA: Diagnosis not present

## 2023-04-18 DIAGNOSIS — F1010 Alcohol abuse, uncomplicated: Secondary | ICD-10-CM | POA: Diagnosis not present

## 2023-04-18 DIAGNOSIS — R509 Fever, unspecified: Secondary | ICD-10-CM | POA: Diagnosis not present

## 2023-04-18 DIAGNOSIS — T83511D Infection and inflammatory reaction due to indwelling urethral catheter, subsequent encounter: Secondary | ICD-10-CM | POA: Diagnosis not present

## 2023-04-18 DIAGNOSIS — J449 Chronic obstructive pulmonary disease, unspecified: Secondary | ICD-10-CM | POA: Diagnosis not present

## 2023-04-18 DIAGNOSIS — R1319 Other dysphagia: Secondary | ICD-10-CM | POA: Diagnosis not present

## 2023-04-18 DIAGNOSIS — I4811 Longstanding persistent atrial fibrillation: Secondary | ICD-10-CM | POA: Diagnosis not present

## 2023-04-18 DIAGNOSIS — Z8673 Personal history of transient ischemic attack (TIA), and cerebral infarction without residual deficits: Secondary | ICD-10-CM | POA: Diagnosis not present

## 2023-04-18 DIAGNOSIS — N311 Reflex neuropathic bladder, not elsewhere classified: Secondary | ICD-10-CM | POA: Diagnosis not present

## 2023-04-18 DIAGNOSIS — E782 Mixed hyperlipidemia: Secondary | ICD-10-CM | POA: Diagnosis not present

## 2023-04-18 DIAGNOSIS — L03311 Cellulitis of abdominal wall: Secondary | ICD-10-CM | POA: Diagnosis not present

## 2023-04-18 DIAGNOSIS — E441 Mild protein-calorie malnutrition: Secondary | ICD-10-CM | POA: Diagnosis not present

## 2023-04-18 DIAGNOSIS — I10 Essential (primary) hypertension: Secondary | ICD-10-CM | POA: Diagnosis not present

## 2023-04-18 DIAGNOSIS — N400 Enlarged prostate without lower urinary tract symptoms: Secondary | ICD-10-CM | POA: Diagnosis not present

## 2023-04-18 DIAGNOSIS — M6281 Muscle weakness (generalized): Secondary | ICD-10-CM | POA: Diagnosis not present

## 2023-04-18 DIAGNOSIS — R2689 Other abnormalities of gait and mobility: Secondary | ICD-10-CM | POA: Diagnosis not present

## 2023-04-18 DIAGNOSIS — N3000 Acute cystitis without hematuria: Secondary | ICD-10-CM | POA: Diagnosis not present

## 2023-04-18 DIAGNOSIS — Z7401 Bed confinement status: Secondary | ICD-10-CM | POA: Diagnosis not present

## 2023-04-18 DIAGNOSIS — Z743 Need for continuous supervision: Secondary | ICD-10-CM | POA: Diagnosis not present

## 2023-04-18 DIAGNOSIS — M6289 Other specified disorders of muscle: Secondary | ICD-10-CM | POA: Diagnosis not present

## 2023-04-18 DIAGNOSIS — R278 Other lack of coordination: Secondary | ICD-10-CM | POA: Diagnosis not present

## 2023-04-18 DIAGNOSIS — F4323 Adjustment disorder with mixed anxiety and depressed mood: Secondary | ICD-10-CM | POA: Diagnosis not present

## 2023-04-18 MED ORDER — MUPIROCIN CALCIUM 2 % EX CREA: TOPICAL_CREAM | Freq: Two times a day (BID) | CUTANEOUS | 0 refills | Status: AC

## 2023-04-18 MED ORDER — BISACODYL 5 MG PO TBEC: 5.0000 mg | DELAYED_RELEASE_TABLET | Freq: Every day | ORAL | 1 refills | Status: AC | PRN

## 2023-04-18 MED ORDER — ENSURE ENLIVE PO LIQD: 237.0000 mL | Freq: Three times a day (TID) | ORAL | 0 refills | Status: AC

## 2023-04-18 MED ORDER — POLYETHYLENE GLYCOL 3350 17 G PO PACK: 17.0000 g | PACK | Freq: Every day | ORAL | 0 refills | Status: AC

## 2023-04-18 MED ORDER — DOCUSATE SODIUM 100 MG PO CAPS: 100.0000 mg | ORAL_CAPSULE | Freq: Two times a day (BID) | ORAL | 0 refills | Status: AC

## 2023-04-18 NOTE — Plan of Care (Signed)
°  Problem: Clinical Measurements: Goal: Ability to avoid or minimize complications of infection will improve Outcome: Progressing   Problem: Skin Integrity: Goal: Skin integrity will improve Outcome: Progressing   Problem: Education: Goal: Knowledge of General Education information will improve Description: Including pain rating scale, medication(s)/side effects and non-pharmacologic comfort measures Outcome: Progressing   Problem: Activity: Goal: Risk for activity intolerance will decrease Outcome: Progressing

## 2023-04-18 NOTE — TOC Transition Note (Signed)
Transition of Care [facility]) - CM/SW Discharge Note   Patient Details  Name: Calvin [name] [name]: [id number] Date of Birth: 1946/04/08  Transition of Care [facility]) CM/SW Contact:  Otelia Santee, LCSW Phone Number: 04/18/2023, 1:25 PM   Clinical Narrative:    Pt's insurance approved SNF. Pt to transfer to [facility]. Pt will be going to room 410-1. RN to call report to [phone]. DC packet placed at RN station. PTAR called at 2:05pm for transportation.    Final next level of care: Skilled Nursing Facility Barriers to Discharge: Barriers Resolved   Patient Goals and CMS Choice CMS Medicare.gov Compare Post Acute Care list provided to:: Patient Represenative (must comment) Choice offered to / list presented to : Adult Children  Discharge Placement     Existing PASRR number confirmed : 04/12/23          Patient chooses bed at: [facility] Patient to be transferred to facility by: PTAR Name of family member notified: Patient Patient and family notified of of transfer: 04/18/23  Discharge Plan and Services Additional resources added to the After Visit Summary for   In-house Referral: Clinical Social Work Discharge Planning Services: NA Post Acute Care Choice:  (Unsure if wants SNF or to return home)          DME Arranged: N/A DME Agency: NA                  Social Determinants of Health (SDOH) Interventions SDOH Screenings   Food Insecurity: No Food Insecurity (04/11/2023)  Housing: Low Risk  (04/11/2023)  Transportation Needs: No Transportation Needs (04/11/2023)  Utilities: Not At Risk (04/11/2023)  Alcohol Screen: Medium Risk (04/14/2020)  Depression (PHQ2-9): Low Risk  (06/28/2022)  Financial Resource Strain: Low Risk  (04/14/2020)  Physical Activity: Inactive (03/08/2021)  Social Connections: Socially Isolated (03/08/2021)  Stress: No Stress Concern Present (04/14/2020)  Tobacco Use: Low Risk  (04/10/2023)     Readmission Risk  Interventions    04/12/2023   12:45 PM 01/06/2023   12:32 PM 10/29/2022   12:11 PM  Readmission Risk Prevention Plan  Transportation Screening Complete Complete Complete  PCP or Specialist Appt within 5-7 Days  Complete   PCP or Specialist Appt within 3-5 Days Complete  Complete  Home Care Screening  Complete   Medication Review (RN CM)  Complete   HRI or Home Care Consult Complete  Complete  Social Work Consult for Recovery Care Planning/Counseling Complete  Complete  Palliative Care Screening Complete  Not Applicable  Medication Review [employer]) Complete  Complete

## 2023-04-18 NOTE — Discharge Summary (Addendum)
Physician Discharge Summary   Patient: Calvin [name] [name]: [id number] DOB: 1945/07/28  Admit date:     04/10/2023  Discharge date: 04/18/23  Discharge Physician: Lynden Oxford  PCP: [name], MD  Recommendations at discharge:  Follow up with PCP in 1 week   Follow-up Information     [name], MD. Schedule an appointment as soon as possible for a visit in 1 week(s).   Specialty: Family Medicine Contact information: [address] [place] [address] [phone]                Discharge Diagnoses: Principal Problem:   UTI (urinary tract infection) Active Problems:   Essential hypertension   Alcohol abuse   Bladder outflow obstruction   Indwelling Foley catheter present   History of stroke   Mixed hyperlipidemia   Catheter-associated urinary tract infection (HCC)   Cellulitis of abdominal wall   Acute metabolic encephalopathy  Hospital Course: 77 yom w/ hx of CVA, neurogenic bladder on SP catheter replaced a week ago, A.fib on Xarelto brought to the ED after caregiver noticed catheter was backed up, and patient was complaining of pain at the insertion site for 2 days  and had redness with bad odor in his groin area to his navel and suspected to have cellulitis, urine was very cloudy with bad odor. Recent urine culture had Proteus resistant to Cipro and Macrobid and a remote really had Klebsiella resistant to penicillin. In the ED abdomen was tender in the suprapubic area, vitals showed mild tachycardia 105 heart rate 160s, not hypoxic, no fever. Labs with leukocytosis 10.9, and stable renal function, UA grossly abnormal, lactic acid normal ammonia normal Blood culture urine culture collected and patient was started on antibiotics and admitted for further management.  Catheter associated UTI. Present on admission. Chronic indwelling Foley catheter. Proteus in the urine culture most likely contamination. But patient does have E faecalis. Has been  treated with Rocephin. Currently I will also add 1 dose of fosfomycin. Monitor for now.   Suprapubic catheter. Present on admission. Catheter site does appear to have some redness although no evidence of acute purulent discharge or drainage or significant cellulitis. Treated with Antibiotics Will treat topically and monitor.   HTN. Blood pressure stable. Continue home regimen.   CVA. HLD. On Xarelto and statin.  We can continue.   Deconditioning. Prior to admission patient was able to ambulate 80 feet. Pt was discharged to SNF in August and stayed there for 30 days. Was seen in PCP office in September when he was residing at home.  Currently unable to ambulate without a lot assistance. Progressively weakening most likely in the setting of ongoing UTI and cellulitis. Would benefit from SNF.   COPD. Currently no exacerbation.  Monitor.  Obesity Class 1 Body mass index is 34.77 kg/m.  Placing the pt at higher risk of poor outcomes.   Consultants:  none  Procedures performed:  none  DISCHARGE MEDICATION: Allergies as of 04/18/2023   No Known Allergies      Medication List     STOP taking these medications    amoxicillin-clavulanate 875-125 MG tablet Commonly known as: AUGMENTIN   HYDROcodone bit-homatropine 5-1.5 MG/5ML syrup Commonly known as: HYCODAN   Ibuprofen 200 MG Caps   Klor-Con M10 10 MEQ tablet Generic drug: potassium chloride   loperamide 2 MG capsule Commonly known as: IMODIUM   potassium chloride 10 MEQ tablet Commonly known as: KLOR-CON       TAKE  these medications    albuterol 108 (90 Base) MCG/ACT inhaler Commonly known as: VENTOLIN HFA Inhale 2 puffs into the lungs every 4 (four) hours as needed for wheezing or shortness of breath.   atorvastatin 20 MG tablet Commonly known as: LIPITOR Take 1 tablet (20 mg total) by mouth daily.   BD Eclipse Syringe/Needle 25G X 5/8" 3 ML Misc Generic drug: SYRINGE-NEEDLE (DISP) 3 ML Use as  directed once a week   bisacodyl 5 MG EC tablet Commonly known as: Dulcolax Take 1 tablet (5 mg total) by mouth daily as needed for moderate constipation.   cyanocobalamin 1000 MCG/ML injection Commonly known as: VITAMIN B12 INJECT 1 ML (1,000 MCG TOTAL) INTO THE MUSCLE ONCE A WEEK.   cyclobenzaprine 10 MG tablet Commonly known as: FLEXERIL Take 1 tablet (10 mg total) by mouth 3 (three) times daily as needed for muscle spasms.   diltiazem 180 MG 24 hr capsule Commonly known as: CARDIZEM CD Take 1 capsule (180 mg total) by mouth daily. What changed: when to take this   docusate sodium 100 MG capsule Commonly known as: Colace Take 1 capsule (100 mg total) by mouth 2 (two) times daily.   feeding supplement Liqd Take 237 mLs by mouth 3 (three) times daily between meals.   finasteride 5 MG tablet Commonly known as: PROSCAR TAKE 1 TABLET (5 MG TOTAL) BY MOUTH DAILY.   fluticasone 50 MCG/ACT nasal spray Commonly known as: FLONASE Place 1 spray into both nostrils daily. What changed:  how much to take when to take this reasons to take this   folic acid 1 MG tablet Commonly known as: FOLVITE Take 1 tablet (1 mg total) by mouth daily.   furosemide 20 MG tablet Commonly known as: LASIX Take 1 tablet (20 mg total) by mouth daily. What changed: when to take this   losartan 100 MG tablet Commonly known as: COZAAR Take 100 mg by mouth daily.   multivitamin with minerals Tabs tablet Take 1 tablet by mouth daily.   mupirocin cream 2 % Commonly known as: BACTROBAN Apply topically 2 (two) times daily for 7 days. Suprapubic catheter site   ondansetron 4 MG tablet Commonly known as: ZOFRAN Take 1 tablet (4 mg total) by mouth every 8 (eight) hours as needed for nausea or vomiting.   polyethylene glycol 17 g packet Commonly known as: MIRALAX / GLYCOLAX Take 17 g by mouth daily. Start taking on: April 19, 2023   rivaroxaban 20 MG Tabs tablet Commonly known as:  XARELTO Take 1 tablet (20 mg total) by mouth daily.   sertraline 100 MG tablet Commonly known as: ZOLOFT TAKE 1 TABLET BY MOUTH EVERY DAY   tamsulosin 0.4 MG Caps capsule Commonly known as: FLOMAX TAKE 1 CAPSULE BY MOUTH EVERY DAY   thiamine 100 MG tablet Commonly known as: Vitamin B-1 Take 1 tablet (100 mg total) by mouth daily.               Discharge Care Instructions  (From admission, onward)           Start     Ordered   04/18/23 0000  Discharge wound care:       Comments: Foam dressing to sacrum and bilat buttocks. Change Q 3 days or PRN soiling   04/18/23 1207           Disposition: SNF Diet recommendation: Carb modified diet  Discharge Exam: Vitals:   04/17/23 2119 04/18/23 0233 04/18/23 0603 04/18/23 1040  BP: Marland Kitchen)  161/105 (!) 149/91 (!) 156/97 (!) 151/91  Pulse: 95 85 78   Resp: 18  19   Temp: 98.7 F (37.1 C)  98 F (36.7 C)   TempSrc: Oral     SpO2: 94%  94%   Weight:      Height:       General: Appear in no distress; no visible Abnormal Neck Mass Or lumps, Conjunctiva normal Cardiovascular: S1 and S2 Present, no Murmur, Respiratory: good respiratory effort, Bilateral Air entry present and CTA, no Crackles, no wheezes Abdomen: Bowel Sound present, Non tender , suprapubic catheter site with improving redness Extremities: no Pedal edema Neurology: alert and oriented to time, place, and person  Filed Weights   04/10/23 1753 04/12/23 1203  Weight: 70.3 kg 116.3 kg   Condition at discharge: stable  The results of significant diagnostics from this hospitalization (including imaging, microbiology, ancillary and laboratory) are listed below for reference.   Imaging Studies: LONG TERM MONITOR (3-14 DAYS)  Result Date: 04/11/2023 Patch Wear Time:  13 days and 23 hours Patient had a min HR of 56 bpm, max HR of 218 bpm, and avg HR of 93 bpm. Predominant underlying rhythm was Sinus Rhythm. 9 Ventricular Tachycardia runs occurred, all 6 beats or  less Less than 1% atrial fibrillation burden, heart rates 110-175 bpm (avg of 144 bpm), longest 17 mins 3 secs avg rate 143 bpm. 7.9% supraventricular ectopy 2.4% ventricular ectopy No patient triggered episodes recorded Will Camnitz, MD   CT ABDOMEN PELVIS W CONTRAST  Result Date: 04/10/2023 CLINICAL DATA:  History of frequent UTIs with decreased drainage from suprapubic catheter, initial encounter EXAM: CT ABDOMEN AND PELVIS WITH CONTRAST TECHNIQUE: Multidetector CT imaging of the abdomen and pelvis was performed using the standard protocol following bolus administration of intravenous contrast. RADIATION DOSE REDUCTION: This exam was performed according to the departmental dose-optimization program which includes automated exposure control, adjustment of the mA and/or kV according to patient size and/or use of iterative reconstruction technique. CONTRAST:  OMNIPAQUE IOHEXOL 300 MG/ML  SOLN COMPARISON:  01/14/2011 FINDINGS: Lower chest: No acute abnormality. Hepatobiliary: Liver shows some hypodensities consistent with simple cysts. These are stable from the prior exam. No follow-up is recommended. The gallbladder is well distended but otherwise within normal limits. Pancreas: Unremarkable. No pancreatic ductal dilatation or surrounding inflammatory changes. Spleen: Normal in size without focal abnormality. Adrenals/Urinary Tract: Adrenal glands again demonstrate a hypodense right adrenal lesion measuring 2.2 cm. This is stable from multiple previous exams consistent with a benign adenoma. No further follow-up is recommended. Kidneys demonstrate a normal enhancement pattern bilaterally. Renal cysts are noted on the left. These are stable and also need no follow-up. The bladder is decompressed by suprapubic catheter. Inflammatory changes are noted surrounding the bladder which may be related to the known UTI. Stomach/Bowel: Scattered diverticular change of the colon is noted without evidence of  diverticulitis. The appendix is within normal limits. Small bowel and stomach are unremarkable. Vascular/Lymphatic: Aortic atherosclerosis. No enlarged abdominal or pelvic lymph nodes. Reproductive: Prostate is enlarged indenting upon the inferior aspect of the bladder. Other: No abdominal wall hernia or abnormality. No abdominopelvic ascites. Musculoskeletal: No acute abnormality in the lumbar spine. IMPRESSION: Mild inflammatory changes surrounding the decompressed bladder. This may be related to the known UTI. Diverticulosis without diverticulitis. Remainder of the exam is stable from the prior study. Electronically Signed   By: Alcide Clever M.D.   On: 04/10/2023 22:58    Microbiology: Results for orders placed or  performed during the hospital encounter of 04/10/23  Culture, blood (routine x 2)     Status: None   Collection Time: 04/10/23  6:14 PM   Specimen: BLOOD  Result Value Ref Range Status   Specimen Description   Final    BLOOD RIGHT ANTECUBITAL Performed at [facility], [address] [place], [place] [address]    Special Requests   Final    BOTTLES DRAWN AEROBIC AND ANAEROBIC Blood Culture adequate volume Performed at [facility], [address], [place], [place] [address]    Culture   Final    NO GROWTH 5 DAYS Performed at [facility], [address], [place], [place] [address]    Report Status 04/15/2023 FINAL  Final  Culture, blood (routine x 2)     Status: None   Collection Time: 04/10/23  7:02 PM   Specimen: BLOOD  Result Value Ref Range Status   Specimen Description   Final    BLOOD BLOOD RIGHT HAND Performed at [facility], [address] [place], [place] [address]    Special Requests   Final    BOTTLES DRAWN AEROBIC AND ANAEROBIC Blood Culture results may not be optimal due to an inadequate volume of blood received in culture bottles Performed at [facility], [address], [place], [place] [address]    Culture   Final    NO GROWTH 5 DAYS Performed at [facility], [address], [place], [place] [address]    Report Status 04/15/2023 FINAL  Final  Urine Culture     Status: Abnormal (Preliminary result)   Collection Time: 04/10/23  7:54 PM   Specimen: Urine, Suprapubic  Result Value Ref Range Status   Specimen Description   Final    URINE, SUPRAPUBIC Performed at [facility], [address], [place], [place] [address]    Special Requests   Final    NONE Reflexed from [id number] Performed at [facility], [address], [place], [place] [address]    Culture (A)  Final    >=100,000 COLONIES/mL PROTEUS MIRABILIS >=100,000 COLONIES/mL ENTEROCOCCUS FAECALIS UNABLE TO OBTAIN SUSCEPTIBILITES FOR ENTERCOCCUS Dannette Barbara [phone] FOR FURTHER INFORMATION Performed at [facility], [address] [place], [place] [address]    Report Status PENDING  Incomplete   Organism ID, Bacteria PROTEUS MIRABILIS (A)  Final      Susceptibility   Proteus mirabilis - MIC*    AMPICILLIN <=2 SENSITIVE Sensitive     CEFAZOLIN 8 SENSITIVE Sensitive     CEFEPIME 0.5 SENSITIVE Sensitive     CEFTRIAXONE <=0.25 SENSITIVE Sensitive     CIPROFLOXACIN 2 RESISTANT Resistant     GENTAMICIN <=1 SENSITIVE Sensitive     IMIPENEM 2 SENSITIVE Sensitive     NITROFURANTOIN 128 RESISTANT Resistant     TRIMETH/SULFA <=20 SENSITIVE Sensitive     AMPICILLIN/SULBACTAM <=2 SENSITIVE Sensitive     PIP/TAZO <=4 SENSITIVE Sensitive ug/mL    * >=100,000 COLONIES/mL PROTEUS MIRABILIS  MRSA Next Gen by PCR, Nasal     Status: None   Collection Time: 04/10/23 11:53 PM   Specimen: Nasal Mucosa; Nasal Swab  Result Value Ref Range Status   MRSA by PCR Next Gen NOT DETECTED NOT DETECTED Final    Comment: (NOTE) The GeneXpert MRSA Assay (FDA approved for NASAL specimens only), is one component of a comprehensive MRSA colonization surveillance program. It  is not  intended to diagnose MRSA infection nor to guide or monitor treatment for MRSA infections. Test performance is not FDA approved in patients less than 37 years old. Performed at [facility], [address], [place], [place] [address]    Labs: CBC: Recent Labs  Lab 04/14/23 1119 04/17/23 0541  WBC 7.2 9.6  HGB 13.3 13.5  HCT 39.4 39.6  MCV 93.8 93.2  PLT 282 310   Basic Metabolic Panel: Recent Labs  Lab 04/11/23 1354 04/14/23 1119 04/17/23 0541  NA  --  132* 134*  K  --  3.5 3.7  CL  --  102 99  CO2  --  24 25  GLUCOSE  --  172* 106*  BUN  --  13 18  CREATININE  --  0.72 0.78  CALCIUM  --  8.8* 9.3  MG 1.8  --   --   PHOS 3.7  --   --    Liver Function Tests: Recent Labs  Lab 04/11/23 1354  AST 12*  ALT 15  ALKPHOS 92  BILITOT 0.7  PROT 6.5  ALBUMIN 3.3*   CBG: No results for input(s): "GLUCAP" in the last 168 hours.  Discharge time spent: greater than 30 minutes.  Author: Lynden Oxford, MD  [facility]

## 2023-04-19 DIAGNOSIS — G9341 Metabolic encephalopathy: Secondary | ICD-10-CM | POA: Diagnosis not present

## 2023-04-19 DIAGNOSIS — N311 Reflex neuropathic bladder, not elsewhere classified: Secondary | ICD-10-CM | POA: Diagnosis not present

## 2023-04-19 DIAGNOSIS — T83511D Infection and inflammatory reaction due to indwelling urethral catheter, subsequent encounter: Secondary | ICD-10-CM | POA: Diagnosis not present

## 2023-04-19 DIAGNOSIS — I4811 Longstanding persistent atrial fibrillation: Secondary | ICD-10-CM | POA: Diagnosis not present

## 2023-04-19 DIAGNOSIS — L03311 Cellulitis of abdominal wall: Secondary | ICD-10-CM | POA: Diagnosis not present

## 2023-04-19 DIAGNOSIS — J449 Chronic obstructive pulmonary disease, unspecified: Secondary | ICD-10-CM | POA: Diagnosis not present

## 2023-04-19 DIAGNOSIS — F1010 Alcohol abuse, uncomplicated: Secondary | ICD-10-CM | POA: Diagnosis not present

## 2023-04-19 DIAGNOSIS — E782 Mixed hyperlipidemia: Secondary | ICD-10-CM | POA: Diagnosis not present

## 2023-04-19 DIAGNOSIS — F4323 Adjustment disorder with mixed anxiety and depressed mood: Secondary | ICD-10-CM | POA: Diagnosis not present

## 2023-04-19 DIAGNOSIS — I10 Essential (primary) hypertension: Secondary | ICD-10-CM | POA: Diagnosis not present

## 2023-04-19 DIAGNOSIS — N3000 Acute cystitis without hematuria: Secondary | ICD-10-CM | POA: Diagnosis not present

## 2023-04-19 DIAGNOSIS — Z8673 Personal history of transient ischemic attack (TIA), and cerebral infarction without residual deficits: Secondary | ICD-10-CM | POA: Diagnosis not present

## 2023-04-19 LAB — URINE CULTURE: Culture: >=100000 — AB

## 2023-04-22 DIAGNOSIS — N390 Urinary tract infection, site not specified: Secondary | ICD-10-CM | POA: Diagnosis not present

## 2023-04-22 DIAGNOSIS — G9341 Metabolic encephalopathy: Secondary | ICD-10-CM | POA: Diagnosis not present

## 2023-04-22 DIAGNOSIS — R5381 Other malaise: Secondary | ICD-10-CM | POA: Diagnosis not present

## 2023-04-22 NOTE — Consult Note (Addendum)
[facility]  Consult   04/22/2023  HILBERT BRIGGS Oct 19, 1945 [id number]  [facility] [[facility]I] SNF review note - Post hospital review for patient at [facility] DC'd 04/18/23   Primary Care Provider:  [name], MD with [place] at [facility] which is listed to provide the transition of care [TOC] follow up  Insurance: [employer]  Patient was reviewed for readmission prevention needs with noted 8 day length of stay barriers to care in community.   Patient was screened for hospitalization and on behalf of [facility]  Care Coordination to assess for post hospital community care needs.  Patient transitioned an affiliated skilled nursing facility level of care for post hospital transition and transitioned on 04/18/23 noted with [facility]. .  Plan:   Will notify the Community [facility] RN can follow for any known or needs for transitional care needs for returning to post facility care coordination needs to return to community.  For questions or referrals, please contact:   Charlesetta Shanks, RN, BSN, CCM [place]  [facility], [facility] Liaison Direct Dial: [phone] or secure chat Website: [email or web address]

## 2023-04-24 DIAGNOSIS — F4323 Adjustment disorder with mixed anxiety and depressed mood: Secondary | ICD-10-CM | POA: Diagnosis not present

## 2023-04-24 DIAGNOSIS — F1010 Alcohol abuse, uncomplicated: Secondary | ICD-10-CM | POA: Diagnosis not present

## 2023-04-26 ENCOUNTER — Other Ambulatory Visit: Payer: Self-pay | Admitting: Family Medicine

## 2023-05-08 ENCOUNTER — Other Ambulatory Visit: Payer: Self-pay | Admitting: *Deleted

## 2023-05-08 NOTE — Patient Outreach (Signed)
Per [facility], Mr. Rosenboom resides in [facility] skilled nursing facility.  Screening for potential chronic care management services as a benefit of health plan and primary care provider.  Secure communication sent to [facility] social worker, to collaborate about transition plans and potential care coordination/chronic care management needs.   Will await response from SNF social worker.   Raiford Noble, MSN, RN, BSN [place]  [facility], [facility] RN Post- Acute Care Manager Direct Dial: [phone]

## 2023-05-09 DIAGNOSIS — E782 Mixed hyperlipidemia: Secondary | ICD-10-CM | POA: Diagnosis not present

## 2023-05-09 DIAGNOSIS — I10 Essential (primary) hypertension: Secondary | ICD-10-CM | POA: Diagnosis not present

## 2023-05-09 DIAGNOSIS — G9341 Metabolic encephalopathy: Secondary | ICD-10-CM | POA: Diagnosis not present

## 2023-05-09 DIAGNOSIS — I4811 Longstanding persistent atrial fibrillation: Secondary | ICD-10-CM | POA: Diagnosis not present

## 2023-05-09 DIAGNOSIS — N390 Urinary tract infection, site not specified: Secondary | ICD-10-CM | POA: Diagnosis not present

## 2023-05-09 DIAGNOSIS — T83511D Infection and inflammatory reaction due to indwelling urethral catheter, subsequent encounter: Secondary | ICD-10-CM | POA: Diagnosis not present

## 2023-05-09 DIAGNOSIS — J449 Chronic obstructive pulmonary disease, unspecified: Secondary | ICD-10-CM | POA: Diagnosis not present

## 2023-05-09 DIAGNOSIS — N311 Reflex neuropathic bladder, not elsewhere classified: Secondary | ICD-10-CM | POA: Diagnosis not present

## 2023-05-09 DIAGNOSIS — L03311 Cellulitis of abdominal wall: Secondary | ICD-10-CM | POA: Diagnosis not present

## 2023-05-17 DIAGNOSIS — Z2239 Carrier of other specified bacterial diseases: Secondary | ICD-10-CM | POA: Diagnosis not present

## 2023-05-17 DIAGNOSIS — N319 Neuromuscular dysfunction of bladder, unspecified: Secondary | ICD-10-CM | POA: Diagnosis not present

## 2023-05-17 DIAGNOSIS — Z9359 Other cystostomy status: Secondary | ICD-10-CM | POA: Diagnosis not present

## 2023-05-17 DIAGNOSIS — Z466 Encounter for fitting and adjustment of urinary device: Secondary | ICD-10-CM | POA: Diagnosis not present

## 2023-05-17 DIAGNOSIS — J449 Chronic obstructive pulmonary disease, unspecified: Secondary | ICD-10-CM | POA: Diagnosis not present

## 2023-05-17 DIAGNOSIS — N3001 Acute cystitis with hematuria: Secondary | ICD-10-CM | POA: Diagnosis not present

## 2023-05-17 DIAGNOSIS — L89156 Pressure-induced deep tissue damage of sacral region: Secondary | ICD-10-CM | POA: Diagnosis not present

## 2023-05-17 DIAGNOSIS — N1330 Unspecified hydronephrosis: Secondary | ICD-10-CM | POA: Diagnosis not present

## 2023-05-17 DIAGNOSIS — F419 Anxiety disorder, unspecified: Secondary | ICD-10-CM | POA: Diagnosis not present

## 2023-05-17 DIAGNOSIS — E669 Obesity, unspecified: Secondary | ICD-10-CM | POA: Diagnosis not present

## 2023-05-17 DIAGNOSIS — N390 Urinary tract infection, site not specified: Secondary | ICD-10-CM | POA: Diagnosis not present

## 2023-05-17 DIAGNOSIS — A419 Sepsis, unspecified organism: Secondary | ICD-10-CM | POA: Diagnosis not present

## 2023-05-17 DIAGNOSIS — I480 Paroxysmal atrial fibrillation: Secondary | ICD-10-CM | POA: Diagnosis not present

## 2023-05-17 DIAGNOSIS — I69398 Other sequelae of cerebral infarction: Secondary | ICD-10-CM | POA: Diagnosis not present

## 2023-05-17 DIAGNOSIS — R338 Other retention of urine: Secondary | ICD-10-CM | POA: Diagnosis not present

## 2023-05-17 DIAGNOSIS — Z6834 Body mass index (BMI) 34.0-34.9, adult: Secondary | ICD-10-CM | POA: Diagnosis not present

## 2023-05-17 DIAGNOSIS — E782 Mixed hyperlipidemia: Secondary | ICD-10-CM | POA: Diagnosis not present

## 2023-05-17 DIAGNOSIS — N3000 Acute cystitis without hematuria: Secondary | ICD-10-CM | POA: Diagnosis not present

## 2023-05-17 DIAGNOSIS — Y846 Urinary catheterization as the cause of abnormal reaction of the patient, or of later complication, without mention of misadventure at the time of the procedure: Secondary | ICD-10-CM | POA: Diagnosis not present

## 2023-05-17 DIAGNOSIS — N401 Enlarged prostate with lower urinary tract symptoms: Secondary | ICD-10-CM | POA: Diagnosis not present

## 2023-05-17 DIAGNOSIS — T83511A Infection and inflammatory reaction due to indwelling urethral catheter, initial encounter: Secondary | ICD-10-CM | POA: Diagnosis not present

## 2023-05-17 DIAGNOSIS — F32A Depression, unspecified: Secondary | ICD-10-CM | POA: Diagnosis not present

## 2023-05-17 DIAGNOSIS — R197 Diarrhea, unspecified: Secondary | ICD-10-CM | POA: Diagnosis not present

## 2023-05-17 DIAGNOSIS — I10 Essential (primary) hypertension: Secondary | ICD-10-CM | POA: Diagnosis not present

## 2023-05-17 DIAGNOSIS — A498 Other bacterial infections of unspecified site: Secondary | ICD-10-CM | POA: Diagnosis not present

## 2023-05-18 DIAGNOSIS — T83511A Infection and inflammatory reaction due to indwelling urethral catheter, initial encounter: Secondary | ICD-10-CM | POA: Diagnosis not present

## 2023-05-18 DIAGNOSIS — N390 Urinary tract infection, site not specified: Secondary | ICD-10-CM | POA: Diagnosis not present

## 2023-05-19 DIAGNOSIS — N390 Urinary tract infection, site not specified: Secondary | ICD-10-CM | POA: Diagnosis not present

## 2023-05-19 DIAGNOSIS — T83511A Infection and inflammatory reaction due to indwelling urethral catheter, initial encounter: Secondary | ICD-10-CM | POA: Diagnosis not present

## 2023-05-20 DIAGNOSIS — T83511A Infection and inflammatory reaction due to indwelling urethral catheter, initial encounter: Secondary | ICD-10-CM | POA: Diagnosis not present

## 2023-05-20 DIAGNOSIS — A498 Other bacterial infections of unspecified site: Secondary | ICD-10-CM | POA: Diagnosis not present

## 2023-05-20 DIAGNOSIS — J449 Chronic obstructive pulmonary disease, unspecified: Secondary | ICD-10-CM | POA: Diagnosis not present

## 2023-05-20 DIAGNOSIS — N390 Urinary tract infection, site not specified: Secondary | ICD-10-CM | POA: Diagnosis not present

## 2023-05-20 DIAGNOSIS — I480 Paroxysmal atrial fibrillation: Secondary | ICD-10-CM | POA: Diagnosis not present

## 2023-05-21 DIAGNOSIS — Z9359 Other cystostomy status: Secondary | ICD-10-CM | POA: Diagnosis not present

## 2023-05-21 DIAGNOSIS — R197 Diarrhea, unspecified: Secondary | ICD-10-CM | POA: Diagnosis not present

## 2023-05-21 DIAGNOSIS — N3000 Acute cystitis without hematuria: Secondary | ICD-10-CM | POA: Diagnosis not present

## 2023-05-22 DIAGNOSIS — Z2239 Carrier of other specified bacterial diseases: Secondary | ICD-10-CM | POA: Diagnosis not present

## 2023-05-22 DIAGNOSIS — Z9359 Other cystostomy status: Secondary | ICD-10-CM | POA: Diagnosis not present

## 2023-05-22 DIAGNOSIS — R197 Diarrhea, unspecified: Secondary | ICD-10-CM | POA: Diagnosis not present

## 2023-05-22 DIAGNOSIS — E669 Obesity, unspecified: Secondary | ICD-10-CM | POA: Diagnosis not present

## 2023-05-27 DIAGNOSIS — R062 Wheezing: Secondary | ICD-10-CM | POA: Diagnosis not present

## 2023-05-27 DIAGNOSIS — Z9359 Other cystostomy status: Secondary | ICD-10-CM | POA: Diagnosis not present

## 2023-05-27 DIAGNOSIS — I10 Essential (primary) hypertension: Secondary | ICD-10-CM | POA: Diagnosis not present

## 2023-05-27 DIAGNOSIS — I6930 Unspecified sequelae of cerebral infarction: Secondary | ICD-10-CM | POA: Diagnosis not present

## 2023-05-27 DIAGNOSIS — T83510D Infection and inflammatory reaction due to cystostomy catheter, subsequent encounter: Secondary | ICD-10-CM | POA: Diagnosis not present

## 2023-05-27 DIAGNOSIS — N390 Urinary tract infection, site not specified: Secondary | ICD-10-CM | POA: Diagnosis not present

## 2023-05-28 DIAGNOSIS — E782 Mixed hyperlipidemia: Secondary | ICD-10-CM | POA: Diagnosis not present

## 2023-05-28 DIAGNOSIS — T83091D Other mechanical complication of indwelling urethral catheter, subsequent encounter: Secondary | ICD-10-CM | POA: Diagnosis not present

## 2023-05-28 DIAGNOSIS — B964 Proteus (mirabilis) (morganii) as the cause of diseases classified elsewhere: Secondary | ICD-10-CM | POA: Diagnosis not present

## 2023-05-28 DIAGNOSIS — B952 Enterococcus as the cause of diseases classified elsewhere: Secondary | ICD-10-CM | POA: Diagnosis not present

## 2023-05-28 DIAGNOSIS — Z51A Encounter for sepsis aftercare: Secondary | ICD-10-CM | POA: Diagnosis not present

## 2023-05-28 DIAGNOSIS — F419 Anxiety disorder, unspecified: Secondary | ICD-10-CM | POA: Diagnosis not present

## 2023-05-28 DIAGNOSIS — F329 Major depressive disorder, single episode, unspecified: Secondary | ICD-10-CM | POA: Diagnosis not present

## 2023-05-28 DIAGNOSIS — T360X5S Adverse effect of penicillins, sequela: Secondary | ICD-10-CM | POA: Diagnosis not present

## 2023-05-28 DIAGNOSIS — Z87891 Personal history of nicotine dependence: Secondary | ICD-10-CM | POA: Diagnosis not present

## 2023-05-28 DIAGNOSIS — I10 Essential (primary) hypertension: Secondary | ICD-10-CM | POA: Diagnosis not present

## 2023-05-28 DIAGNOSIS — Z7901 Long term (current) use of anticoagulants: Secondary | ICD-10-CM | POA: Diagnosis not present

## 2023-05-28 DIAGNOSIS — I69398 Other sequelae of cerebral infarction: Secondary | ICD-10-CM | POA: Diagnosis not present

## 2023-05-28 DIAGNOSIS — R339 Retention of urine, unspecified: Secondary | ICD-10-CM | POA: Diagnosis not present

## 2023-05-28 DIAGNOSIS — T83511S Infection and inflammatory reaction due to indwelling urethral catheter, sequela: Secondary | ICD-10-CM | POA: Diagnosis not present

## 2023-05-28 DIAGNOSIS — N390 Urinary tract infection, site not specified: Secondary | ICD-10-CM | POA: Diagnosis not present

## 2023-05-28 DIAGNOSIS — N319 Neuromuscular dysfunction of bladder, unspecified: Secondary | ICD-10-CM | POA: Diagnosis not present

## 2023-05-28 DIAGNOSIS — J4540 Moderate persistent asthma, uncomplicated: Secondary | ICD-10-CM | POA: Diagnosis not present

## 2023-05-28 DIAGNOSIS — J4489 Other specified chronic obstructive pulmonary disease: Secondary | ICD-10-CM | POA: Diagnosis not present

## 2023-05-28 DIAGNOSIS — E6609 Other obesity due to excess calories: Secondary | ICD-10-CM | POA: Diagnosis not present

## 2023-05-28 DIAGNOSIS — R197 Diarrhea, unspecified: Secondary | ICD-10-CM | POA: Diagnosis not present

## 2023-05-28 DIAGNOSIS — N401 Enlarged prostate with lower urinary tract symptoms: Secondary | ICD-10-CM | POA: Diagnosis not present

## 2023-05-28 DIAGNOSIS — I480 Paroxysmal atrial fibrillation: Secondary | ICD-10-CM | POA: Diagnosis not present

## 2023-05-28 DIAGNOSIS — Z9181 History of falling: Secondary | ICD-10-CM | POA: Diagnosis not present

## 2023-05-28 DIAGNOSIS — R5381 Other malaise: Secondary | ICD-10-CM | POA: Diagnosis not present

## 2023-05-28 DIAGNOSIS — E66811 Obesity, class 1: Secondary | ICD-10-CM | POA: Diagnosis not present

## 2023-05-29 ENCOUNTER — Ambulatory Visit (HOSPITAL_COMMUNITY): Payer: Medicare HMO | Admitting: Internal Medicine

## 2023-05-29 NOTE — Progress Notes (Incomplete)
Primary Care Physician: [name], MD Primary Cardiologist: Lance Muss, MD Electrophysiologist: None     Referring Physician: Dr. [name] is a 77 y.o. male with a history of CVA in 2023, BPH, HTN, neurogenic bladder s/p suprapubic catheter, and paroxysmal atrial fibrillation who presents for consultation in the [facility]. Seen by Dr. Ladona Ridgel for consultation during recent hospital admission 7/20-21 for catheter obstruction found to be in new onset Afib with RVR. Started on Cardizem 180 mg daily. He was again admitted for catheter issues and s/p bilateral prostate artery embolization and coil embolization of right inferior rectal artery on 7/31 with Xarelto resumed on 8/2. Patient is on Xarelto 20 mg daily for a CHADS2VASC score of 5.  On evaluation today, he is currently in NSR. He is currently in a rehab facility and will be there for about one more week. He doesn't recall any specific symptoms with regards to when he was in Afib with RVR. He has not missed any doses of Cardizem or Xarelto. He does not have any bleeding issues on Xarelto.  On follow up 02/28/23, he is currently in NSR. Since last OV, he admits to not getting rhythm monitoring device. He does not appear to have cardiac awareness when in Afib. He is currently on diltiazem 180 mg daily. He does not have any bleeding issues on Xarelto. He is still in facility but will be discharged in 2 days. He is ultimately trying to move to [place] to be closer to daughter.   On follow up 05/29/23, he is currently in ***. He wore a cardiac monitor after last visit that showed less than 1% Afib burden. He is currently on diltiazem 180 mg daily. No bleeding issues on Xarelto 20 mg daily.   Today, he denies symptoms of palpitations, chest pain, shortness of breath, orthopnea, PND, lower extremity edema, dizziness, presyncope, syncope, snoring, daytime somnolence, bleeding, or neurologic  sequela. The patient is tolerating medications without difficulties and is otherwise without complaint today.    Atrial Fibrillation Risk Factors:  he does not have symptoms or diagnosis of sleep apnea.  he has a BMI of There is no height or weight on file to calculate BMI.. There were no vitals filed for this visit.    Current Outpatient Medications  Medication Sig Dispense Refill   albuterol (VENTOLIN HFA) 108 (90 Base) MCG/ACT inhaler Inhale 2 puffs into the lungs every 4 (four) hours as needed for wheezing or shortness of breath. 8 g 5   atorvastatin (LIPITOR) 20 MG tablet Take 1 tablet (20 mg total) by mouth daily. 90 tablet 3   bisacodyl (DULCOLAX) 5 MG EC tablet Take 1 tablet (5 mg total) by mouth daily as needed for moderate constipation. 30 tablet 1   cyanocobalamin (VITAMIN B12) 1000 MCG/ML injection INJECT 1 ML (1,000 MCG TOTAL) INTO THE MUSCLE ONCE A WEEK. (Patient not taking: Reported on 04/11/2023) 12 mL 1   cyclobenzaprine (FLEXERIL) 10 MG tablet Take 1 tablet (10 mg total) by mouth 3 (three) times daily as needed for muscle spasms. 90 tablet 5   diltiazem (CARDIZEM CD) 180 MG 24 hr capsule Take 1 capsule (180 mg total) by mouth daily. (Patient taking differently: Take 180 mg by mouth 2 (two) times daily.) 30 capsule 3   docusate sodium (COLACE) 100 MG capsule Take 1 capsule (100 mg total) by mouth 2 (two) times daily. 10 capsule 0   feeding supplement (ENSURE  ENLIVE / ENSURE PLUS) LIQD Take 237 mLs by mouth 3 (three) times daily between meals. 10000 mL 0   finasteride (PROSCAR) 5 MG tablet TAKE 1 TABLET (5 MG TOTAL) BY MOUTH DAILY. 90 tablet 1   fluticasone (FLONASE) 50 MCG/ACT nasal spray Place 1 spray into both nostrils daily. (Patient taking differently: Place 1-2 sprays into both nostrils daily as needed for allergies or rhinitis.) 16 g 11   folic acid (FOLVITE) 1 MG tablet Take 1 tablet (1 mg total) by mouth daily. 90 tablet 3   furosemide (LASIX) 20 MG tablet Take 1  tablet (20 mg total) by mouth daily. (Patient taking differently: Take 20 mg by mouth in the morning.) 90 tablet 3   losartan (COZAAR) 100 MG tablet TAKE 1 TABLET BY MOUTH EVERY DAY 90 tablet 3   Multiple Vitamin (MULTIVITAMIN WITH MINERALS) TABS tablet Take 1 tablet by mouth daily. 30 tablet 0   ondansetron (ZOFRAN) 4 MG tablet Take 1 tablet (4 mg total) by mouth every 8 (eight) hours as needed for nausea or vomiting. 90 tablet 5   polyethylene glycol (MIRALAX / GLYCOLAX) 17 g packet Take 17 g by mouth daily. 14 each 0   rivaroxaban (XARELTO) 20 MG TABS tablet Take 1 tablet (20 mg total) by mouth daily. 90 tablet 3   sertraline (ZOLOFT) 100 MG tablet TAKE 1 TABLET BY MOUTH EVERY DAY 90 tablet 0   SYRINGE-NEEDLE, DISP, 3 ML (BD ECLIPSE SYRINGE/NEEDLE) 25G X 5/8" 3 ML MISC Use as directed once a week 12 each 3   tamsulosin (FLOMAX) 0.4 MG CAPS capsule TAKE 1 CAPSULE BY MOUTH EVERY DAY 90 capsule 3   thiamine (VITAMIN B-1) 100 MG tablet Take 1 tablet (100 mg total) by mouth daily. (Patient not taking: Reported on 04/11/2023) 90 tablet 3   No current facility-administered medications for this visit.    Atrial Fibrillation Management history:  Previous antiarrhythmic drugs: None Previous cardioversions: None Previous ablations: None Anticoagulation history: Xarelto  ROS- All systems are reviewed and negative except as per the HPI above.  Physical Exam: There were no vitals taken for this visit.  GEN- The patient is well appearing, alert and oriented x 3 today.   Neck - no JVD or carotid bruit noted Lungs- Clear to ausculation bilaterally, normal work of breathing Heart- ***Regular rate and rhythm, no murmurs, rubs or gallops, PMI not laterally displaced Extremities- no clubbing, cyanosis, or edema Skin - no rash or ecchymosis noted   EKG today demonstrates  ***  Echo 01/08/23 demonstrated   1. Left ventricular ejection fraction, by estimation, is 60 to 65%. The  left ventricle has  normal function. The left ventricle has no regional  wall motion abnormalities. There is mild concentric left ventricular  hypertrophy. Left ventricular diastolic  parameters are indeterminate.   2. Right ventricular systolic function is normal. The right ventricular  size is normal.   3. The mitral valve is grossly normal. No evidence of mitral valve  regurgitation. No evidence of mitral stenosis.   4. The aortic valve was not well visualized. Aortic valve regurgitation  is not visualized. No aortic stenosis is present.   5. There is borderline dilatation of the ascending aorta, measuring 38  mm.   6. The inferior vena cava is normal in size with greater than 50%  respiratory variability, suggesting right atrial pressure of 3 mmHg.   Cardiac monitor 02/2023: Patch Wear Time:  13 days and 23 hours   Patient had a min  HR of 56 bpm, max HR of 218 bpm, and avg HR of 93 bpm.  Predominant underlying rhythm was Sinus Rhythm.  9 Ventricular Tachycardia runs occurred, all 6 beats or less  Less than 1% atrial fibrillation burden, heart rates 110-175 bpm (avg of 144 bpm), longest 17 mins 3 secs avg rate 143 bpm.  7.9% supraventricular ectopy 2.4% ventricular ectopy No patient triggered episodes recorded   Will Camnitz, MD   ASSESSMENT & PLAN CHA2DS2-VASc Score = 5  The patient's score is based upon: CHF History: 0 HTN History: 1 Diabetes History: 0 Stroke History: 2 Vascular Disease History: 0 Age Score: 2 Gender Score: 0       ASSESSMENT AND PLAN: Paroxysmal Atrial Fibrillation (ICD10:  I48.0) The patient's CHA2DS2-VASc score is 5, indicating a 7.2% annual risk of stroke.    He is currently in NSR. Patient states he will get Kardiamobile device at some point. Since he has no cardiac awareness of Afib, I will place 2 week cardiac monitor to determine burden.  Secondary Hypercoagulable State (ICD10:  D68.69) The patient is at significant risk for stroke/thromboembolism based upon  his CHA2DS2-VASc Score of 5.  Continue Rivaroxaban (Xarelto).  No missed doses.    Follow up *** months Afib clinic.    Lake Bells, PA-C  Afib [facility] [address] [place], [place] [address] [phone]

## 2023-05-30 ENCOUNTER — Telehealth: Payer: Self-pay | Admitting: Family Medicine

## 2023-05-30 DIAGNOSIS — Z6833 Body mass index (BMI) 33.0-33.9, adult: Secondary | ICD-10-CM | POA: Diagnosis not present

## 2023-05-30 DIAGNOSIS — F32A Depression, unspecified: Secondary | ICD-10-CM | POA: Diagnosis not present

## 2023-05-30 DIAGNOSIS — R4189 Other symptoms and signs involving cognitive functions and awareness: Secondary | ICD-10-CM | POA: Diagnosis not present

## 2023-05-30 DIAGNOSIS — T83511A Infection and inflammatory reaction due to indwelling urethral catheter, initial encounter: Secondary | ICD-10-CM | POA: Diagnosis not present

## 2023-05-30 DIAGNOSIS — Z9359 Other cystostomy status: Secondary | ICD-10-CM | POA: Diagnosis not present

## 2023-05-30 DIAGNOSIS — I480 Paroxysmal atrial fibrillation: Secondary | ICD-10-CM | POA: Diagnosis not present

## 2023-05-30 DIAGNOSIS — I10 Essential (primary) hypertension: Secondary | ICD-10-CM | POA: Diagnosis not present

## 2023-05-30 DIAGNOSIS — J4540 Moderate persistent asthma, uncomplicated: Secondary | ICD-10-CM | POA: Diagnosis not present

## 2023-05-30 DIAGNOSIS — I6930 Unspecified sequelae of cerebral infarction: Secondary | ICD-10-CM | POA: Diagnosis not present

## 2023-05-30 DIAGNOSIS — E66811 Obesity, class 1: Secondary | ICD-10-CM | POA: Diagnosis not present

## 2023-05-30 DIAGNOSIS — N390 Urinary tract infection, site not specified: Secondary | ICD-10-CM | POA: Diagnosis not present

## 2023-05-30 NOTE — Telephone Encounter (Signed)
Pt was recently discharged from inpatient care at [facility] on 05/23/23

## 2023-06-02 DIAGNOSIS — Z8673 Personal history of transient ischemic attack (TIA), and cerebral infarction without residual deficits: Secondary | ICD-10-CM | POA: Diagnosis not present

## 2023-06-02 DIAGNOSIS — Z7901 Long term (current) use of anticoagulants: Secondary | ICD-10-CM | POA: Diagnosis not present

## 2023-06-02 DIAGNOSIS — I4891 Unspecified atrial fibrillation: Secondary | ICD-10-CM | POA: Diagnosis not present

## 2023-06-02 DIAGNOSIS — Z466 Encounter for fitting and adjustment of urinary device: Secondary | ICD-10-CM | POA: Diagnosis not present

## 2023-06-02 DIAGNOSIS — I10 Essential (primary) hypertension: Secondary | ICD-10-CM | POA: Diagnosis not present

## 2023-06-02 DIAGNOSIS — R319 Hematuria, unspecified: Secondary | ICD-10-CM | POA: Diagnosis not present

## 2023-06-02 DIAGNOSIS — N319 Neuromuscular dysfunction of bladder, unspecified: Secondary | ICD-10-CM | POA: Diagnosis not present

## 2023-06-02 DIAGNOSIS — Z960 Presence of urogenital implants: Secondary | ICD-10-CM | POA: Diagnosis not present

## 2023-06-07 DIAGNOSIS — Z9359 Other cystostomy status: Secondary | ICD-10-CM | POA: Diagnosis not present

## 2023-06-07 DIAGNOSIS — I10 Essential (primary) hypertension: Secondary | ICD-10-CM | POA: Diagnosis not present

## 2023-06-07 DIAGNOSIS — J45909 Unspecified asthma, uncomplicated: Secondary | ICD-10-CM | POA: Diagnosis not present

## 2023-06-07 DIAGNOSIS — I6930 Unspecified sequelae of cerebral infarction: Secondary | ICD-10-CM | POA: Diagnosis not present

## 2023-06-10 DIAGNOSIS — T83511A Infection and inflammatory reaction due to indwelling urethral catheter, initial encounter: Secondary | ICD-10-CM | POA: Diagnosis not present

## 2023-06-10 DIAGNOSIS — N390 Urinary tract infection, site not specified: Secondary | ICD-10-CM | POA: Diagnosis not present

## 2023-07-06 ENCOUNTER — Other Ambulatory Visit: Payer: Self-pay | Admitting: Family Medicine

## 2023-08-02 ENCOUNTER — Ambulatory Visit: Payer: Medicare HMO | Admitting: Neurology

## 2024-05-02 ENCOUNTER — Other Ambulatory Visit: Payer: Self-pay | Admitting: Family Medicine

## 2024-06-10 ENCOUNTER — Other Ambulatory Visit: Payer: Self-pay | Admitting: Family Medicine
